# Patient Record
Sex: Female | Born: 1935
Health system: Southern US, Community
[De-identification: ages and names within clinical notes are randomized; demographics above are authoritative.]

## PROBLEM LIST (undated history)

## (undated) DIAGNOSIS — R32 Unspecified urinary incontinence: Secondary | ICD-10-CM

## (undated) DIAGNOSIS — T7840XA Allergy, unspecified, initial encounter: Secondary | ICD-10-CM

## (undated) DIAGNOSIS — A64 Unspecified sexually transmitted disease: Secondary | ICD-10-CM

## (undated) DIAGNOSIS — I82409 Acute embolism and thrombosis of unspecified deep veins of unspecified lower extremity: Secondary | ICD-10-CM

## (undated) DIAGNOSIS — M810 Age-related osteoporosis without current pathological fracture: Secondary | ICD-10-CM

## (undated) DIAGNOSIS — IMO0001 Reserved for inherently not codable concepts without codable children: Secondary | ICD-10-CM

## (undated) DIAGNOSIS — H269 Unspecified cataract: Secondary | ICD-10-CM

## (undated) DIAGNOSIS — I10 Essential (primary) hypertension: Secondary | ICD-10-CM

## (undated) DIAGNOSIS — F329 Major depressive disorder, single episode, unspecified: Secondary | ICD-10-CM

## (undated) DIAGNOSIS — K219 Gastro-esophageal reflux disease without esophagitis: Secondary | ICD-10-CM

## (undated) DIAGNOSIS — G609 Hereditary and idiopathic neuropathy, unspecified: Secondary | ICD-10-CM

## (undated) DIAGNOSIS — N209 Urinary calculus, unspecified: Secondary | ICD-10-CM

## (undated) DIAGNOSIS — F411 Generalized anxiety disorder: Secondary | ICD-10-CM

## (undated) DIAGNOSIS — M1990 Unspecified osteoarthritis, unspecified site: Secondary | ICD-10-CM

## (undated) DIAGNOSIS — F32A Depression, unspecified: Secondary | ICD-10-CM

## (undated) DIAGNOSIS — E7800 Pure hypercholesterolemia, unspecified: Secondary | ICD-10-CM

## (undated) DIAGNOSIS — Z794 Long term (current) use of insulin: Secondary | ICD-10-CM

## (undated) DIAGNOSIS — C169 Malignant neoplasm of stomach, unspecified: Secondary | ICD-10-CM

## (undated) DIAGNOSIS — E119 Type 2 diabetes mellitus without complications: Secondary | ICD-10-CM

## (undated) DIAGNOSIS — K5730 Diverticulosis of large intestine without perforation or abscess without bleeding: Secondary | ICD-10-CM

## (undated) HISTORY — DX: Unspecified sexually transmitted disease: A64

## (undated) HISTORY — PX: ABDOMINAL SURGERY: SHX537

## (undated) HISTORY — DX: Type 2 diabetes mellitus without complications: E11.9

## (undated) HISTORY — DX: Allergy, unspecified, initial encounter: T78.40XA

## (undated) HISTORY — DX: Diverticulosis of large intestine without perforation or abscess without bleeding: K57.30

## (undated) HISTORY — DX: Unspecified urinary incontinence: R32

## (undated) HISTORY — DX: Pure hypercholesterolemia, unspecified: E78.00

## (undated) HISTORY — DX: Unspecified cataract: H26.9

## (undated) HISTORY — DX: Essential (primary) hypertension: I10

## (undated) HISTORY — DX: Unspecified osteoarthritis, unspecified site: M19.90

## (undated) HISTORY — DX: Gastro-esophageal reflux disease without esophagitis: K21.9

## (undated) HISTORY — DX: Long term (current) use of insulin: Z79.4

## (undated) HISTORY — DX: Age-related osteoporosis without current pathological fracture: M81.0

## (undated) HISTORY — DX: Urinary calculus, unspecified: N20.9

## (undated) HISTORY — DX: Reserved for inherently not codable concepts without codable children: IMO0001

## (undated) HISTORY — DX: Generalized anxiety disorder: F41.1

## (undated) HISTORY — DX: Hereditary and idiopathic neuropathy, unspecified: G60.9

---

## 1993-07-15 HISTORY — PX: CHOLECYSTECTOMY: SHX55

## 1997-10-27 ENCOUNTER — Ambulatory Visit (HOSPITAL_COMMUNITY): Admission: RE | Admit: 1997-10-27 | Discharge: 1997-10-27 | Payer: Self-pay | Admitting: Urology

## 1998-11-08 ENCOUNTER — Encounter: Admission: RE | Admit: 1998-11-08 | Discharge: 1999-02-06 | Payer: Self-pay | Admitting: Endocrinology

## 1999-03-13 ENCOUNTER — Encounter: Payer: Self-pay | Admitting: Endocrinology

## 1999-03-13 ENCOUNTER — Ambulatory Visit (HOSPITAL_COMMUNITY): Admission: RE | Admit: 1999-03-13 | Discharge: 1999-03-13 | Payer: Self-pay | Admitting: Endocrinology

## 2001-10-19 ENCOUNTER — Ambulatory Visit: Admission: RE | Admit: 2001-10-19 | Discharge: 2001-10-19 | Payer: Self-pay | Admitting: Endocrinology

## 2001-12-21 ENCOUNTER — Observation Stay (HOSPITAL_COMMUNITY): Admission: EM | Admit: 2001-12-21 | Discharge: 2001-12-21 | Payer: Self-pay

## 2001-12-21 ENCOUNTER — Encounter: Payer: Self-pay | Admitting: Emergency Medicine

## 2001-12-21 ENCOUNTER — Encounter: Payer: Self-pay | Admitting: Internal Medicine

## 2002-05-13 ENCOUNTER — Other Ambulatory Visit: Admission: RE | Admit: 2002-05-13 | Discharge: 2002-05-13 | Payer: Self-pay | Admitting: Endocrinology

## 2002-06-08 ENCOUNTER — Ambulatory Visit (HOSPITAL_COMMUNITY): Admission: RE | Admit: 2002-06-08 | Discharge: 2002-06-08 | Payer: Self-pay | Admitting: Gastroenterology

## 2002-06-08 ENCOUNTER — Encounter: Payer: Self-pay | Admitting: Gastroenterology

## 2002-06-08 HISTORY — PX: ESOPHAGOGASTRODUODENOSCOPY: SHX1529

## 2002-08-05 ENCOUNTER — Encounter: Payer: Self-pay | Admitting: Endocrinology

## 2002-08-05 ENCOUNTER — Ambulatory Visit (HOSPITAL_COMMUNITY): Admission: RE | Admit: 2002-08-05 | Discharge: 2002-08-05 | Payer: Self-pay | Admitting: Endocrinology

## 2006-02-18 ENCOUNTER — Ambulatory Visit: Payer: Self-pay | Admitting: Endocrinology

## 2006-04-22 ENCOUNTER — Ambulatory Visit: Payer: Self-pay | Admitting: Endocrinology

## 2006-06-04 ENCOUNTER — Ambulatory Visit: Payer: Self-pay | Admitting: Endocrinology

## 2006-06-15 ENCOUNTER — Encounter: Admission: RE | Admit: 2006-06-15 | Discharge: 2006-06-15 | Payer: Self-pay | Admitting: Obstetrics and Gynecology

## 2006-07-23 ENCOUNTER — Ambulatory Visit: Payer: Self-pay | Admitting: Endocrinology

## 2006-08-15 LAB — HM MAMMOGRAPHY: HM Mammogram: NORMAL

## 2006-09-16 ENCOUNTER — Ambulatory Visit: Payer: Self-pay | Admitting: Endocrinology

## 2006-12-02 ENCOUNTER — Ambulatory Visit: Payer: Self-pay | Admitting: Endocrinology

## 2007-02-23 ENCOUNTER — Encounter: Payer: Self-pay | Admitting: Endocrinology

## 2007-02-23 DIAGNOSIS — E119 Type 2 diabetes mellitus without complications: Secondary | ICD-10-CM

## 2007-02-23 DIAGNOSIS — G609 Hereditary and idiopathic neuropathy, unspecified: Secondary | ICD-10-CM

## 2007-02-23 DIAGNOSIS — M810 Age-related osteoporosis without current pathological fracture: Secondary | ICD-10-CM | POA: Insufficient documentation

## 2007-02-23 DIAGNOSIS — K219 Gastro-esophageal reflux disease without esophagitis: Secondary | ICD-10-CM

## 2007-02-23 DIAGNOSIS — Z794 Long term (current) use of insulin: Secondary | ICD-10-CM

## 2007-02-23 DIAGNOSIS — IMO0001 Reserved for inherently not codable concepts without codable children: Secondary | ICD-10-CM | POA: Insufficient documentation

## 2007-02-23 DIAGNOSIS — M1990 Unspecified osteoarthritis, unspecified site: Secondary | ICD-10-CM | POA: Insufficient documentation

## 2007-02-23 HISTORY — DX: Hereditary and idiopathic neuropathy, unspecified: G60.9

## 2007-02-23 HISTORY — DX: Gastro-esophageal reflux disease without esophagitis: K21.9

## 2007-02-23 HISTORY — DX: Age-related osteoporosis without current pathological fracture: M81.0

## 2007-02-23 HISTORY — DX: Unspecified osteoarthritis, unspecified site: M19.90

## 2007-02-24 ENCOUNTER — Ambulatory Visit: Payer: Self-pay | Admitting: Endocrinology

## 2007-03-10 ENCOUNTER — Ambulatory Visit: Payer: Self-pay | Admitting: Endocrinology

## 2007-04-24 ENCOUNTER — Ambulatory Visit: Admission: RE | Admit: 2007-04-24 | Discharge: 2007-04-24 | Payer: Self-pay | Admitting: Endocrinology

## 2007-04-24 ENCOUNTER — Ambulatory Visit: Payer: Self-pay | Admitting: Endocrinology

## 2007-04-24 LAB — CONVERTED CEMR LAB
ALT: 32 units/L (ref 0–35)
Albumin: 3.6 g/dL (ref 3.5–5.2)
Alkaline Phosphatase: 73 units/L (ref 39–117)
Basophils Absolute: 0 10*3/uL (ref 0.0–0.1)
Basophils Relative: 0.4 % (ref 0.0–1.0)
CO2: 28 meq/L (ref 19–32)
Chloride: 110 meq/L (ref 96–112)
Cholesterol: 121 mg/dL (ref 0–200)
Creatinine,U: 76.2 mg/dL
Crystals: NEGATIVE
Eosinophils Relative: 1.8 % (ref 0.0–5.0)
GFR calc non Af Amer: 65 mL/min
Glucose, Bld: 203 mg/dL — ABNORMAL HIGH (ref 70–99)
MCV: 91.3 fL (ref 78.0–100.0)
Microalb Creat Ratio: 21 mg/g (ref 0.0–30.0)
Microalb, Ur: 1.6 mg/dL (ref 0.0–1.9)
Monocytes Absolute: 0.4 10*3/uL (ref 0.2–0.7)
Mucus, UA: NEGATIVE
Neutro Abs: 2.8 10*3/uL (ref 1.4–7.7)
Neutrophils Relative %: 60.7 % (ref 43.0–77.0)
Platelets: 237 10*3/uL (ref 150–400)
RBC: 4.21 M/uL (ref 3.87–5.11)
RDW: 11.5 % (ref 11.5–14.6)
Total CHOL/HDL Ratio: 2.9
Total Protein: 6.9 g/dL (ref 6.0–8.3)
Triglycerides: 94 mg/dL (ref 0–149)
Urine Glucose: NEGATIVE mg/dL
Urobilinogen, UA: 0.2 (ref 0.0–1.0)
VLDL: 19 mg/dL (ref 0–40)

## 2007-05-04 ENCOUNTER — Ambulatory Visit: Payer: Self-pay | Admitting: Vascular Surgery

## 2007-08-26 ENCOUNTER — Ambulatory Visit: Payer: Self-pay | Admitting: Endocrinology

## 2007-08-28 ENCOUNTER — Encounter: Payer: Self-pay | Admitting: Endocrinology

## 2007-10-01 ENCOUNTER — Ambulatory Visit: Payer: Self-pay | Admitting: Endocrinology

## 2007-11-10 ENCOUNTER — Ambulatory Visit: Payer: Self-pay | Admitting: Endocrinology

## 2007-11-11 LAB — CONVERTED CEMR LAB: Hgb A1c MFr Bld: 7.8 % — ABNORMAL HIGH (ref 4.6–6.0)

## 2008-02-01 ENCOUNTER — Ambulatory Visit: Payer: Self-pay | Admitting: Internal Medicine

## 2008-02-01 ENCOUNTER — Ambulatory Visit: Payer: Self-pay | Admitting: Endocrinology

## 2008-02-01 DIAGNOSIS — E1169 Type 2 diabetes mellitus with other specified complication: Secondary | ICD-10-CM | POA: Insufficient documentation

## 2008-02-01 DIAGNOSIS — E7800 Pure hypercholesterolemia, unspecified: Secondary | ICD-10-CM

## 2008-02-01 HISTORY — DX: Pure hypercholesterolemia, unspecified: E78.00

## 2008-02-15 ENCOUNTER — Telehealth (INDEPENDENT_AMBULATORY_CARE_PROVIDER_SITE_OTHER): Payer: Self-pay | Admitting: *Deleted

## 2008-03-08 ENCOUNTER — Ambulatory Visit: Payer: Self-pay | Admitting: Internal Medicine

## 2008-03-08 DIAGNOSIS — F411 Generalized anxiety disorder: Secondary | ICD-10-CM | POA: Insufficient documentation

## 2008-03-08 DIAGNOSIS — K5730 Diverticulosis of large intestine without perforation or abscess without bleeding: Secondary | ICD-10-CM

## 2008-03-08 DIAGNOSIS — I10 Essential (primary) hypertension: Secondary | ICD-10-CM

## 2008-03-08 DIAGNOSIS — I152 Hypertension secondary to endocrine disorders: Secondary | ICD-10-CM | POA: Insufficient documentation

## 2008-03-08 DIAGNOSIS — E1159 Type 2 diabetes mellitus with other circulatory complications: Secondary | ICD-10-CM | POA: Insufficient documentation

## 2008-03-08 DIAGNOSIS — J310 Chronic rhinitis: Secondary | ICD-10-CM

## 2008-03-08 HISTORY — DX: Essential (primary) hypertension: I10

## 2008-03-08 HISTORY — DX: Generalized anxiety disorder: F41.1

## 2008-03-08 HISTORY — DX: Diverticulosis of large intestine without perforation or abscess without bleeding: K57.30

## 2008-04-11 ENCOUNTER — Ambulatory Visit: Payer: Self-pay | Admitting: Endocrinology

## 2008-06-13 ENCOUNTER — Telehealth: Payer: Self-pay | Admitting: Endocrinology

## 2008-06-27 ENCOUNTER — Ambulatory Visit: Payer: Self-pay | Admitting: Endocrinology

## 2008-06-27 LAB — CONVERTED CEMR LAB: Hgb A1c MFr Bld: 7.8 % — ABNORMAL HIGH (ref 4.6–6.0)

## 2008-06-30 ENCOUNTER — Encounter: Payer: Self-pay | Admitting: Endocrinology

## 2008-07-05 ENCOUNTER — Encounter: Payer: Self-pay | Admitting: Internal Medicine

## 2008-07-19 ENCOUNTER — Ambulatory Visit: Payer: Self-pay | Admitting: Endocrinology

## 2008-08-09 ENCOUNTER — Telehealth: Payer: Self-pay | Admitting: Endocrinology

## 2008-10-17 ENCOUNTER — Ambulatory Visit: Payer: Self-pay | Admitting: Endocrinology

## 2008-10-17 LAB — CONVERTED CEMR LAB: Hgb A1c MFr Bld: 7.8 % — ABNORMAL HIGH (ref 4.6–6.5)

## 2009-01-24 ENCOUNTER — Telehealth (INDEPENDENT_AMBULATORY_CARE_PROVIDER_SITE_OTHER): Payer: Self-pay | Admitting: *Deleted

## 2009-02-15 ENCOUNTER — Ambulatory Visit: Payer: Self-pay | Admitting: Endocrinology

## 2009-02-15 DIAGNOSIS — D72819 Decreased white blood cell count, unspecified: Secondary | ICD-10-CM | POA: Insufficient documentation

## 2009-02-15 LAB — CONVERTED CEMR LAB
ALT: 32 units/L (ref 0–35)
AST: 31 units/L (ref 0–37)
Albumin: 3.7 g/dL (ref 3.5–5.2)
Alkaline Phosphatase: 63 units/L (ref 39–117)
BUN: 15 mg/dL (ref 6–23)
Basophils Absolute: 0.1 10*3/uL (ref 0.0–0.1)
Basophils Relative: 1.3 % (ref 0.0–3.0)
Bilirubin Urine: NEGATIVE
Bilirubin, Direct: 0.2 mg/dL (ref 0.0–0.3)
CO2: 30 meq/L (ref 19–32)
Calcium: 9.5 mg/dL (ref 8.4–10.5)
Chloride: 108 meq/L (ref 96–112)
Cholesterol: 125 mg/dL (ref 0–200)
Creatinine, Ser: 0.8 mg/dL (ref 0.4–1.2)
Creatinine,U: 97.1 mg/dL
Eosinophils Absolute: 0.1 10*3/uL (ref 0.0–0.7)
Eosinophils Relative: 2.2 % (ref 0.0–5.0)
GFR calc non Af Amer: 90.21 mL/min (ref >60)
Glucose, Bld: 187 mg/dL — ABNORMAL HIGH (ref 70–99)
HCT: 40.8 % (ref 36.0–46.0)
HDL: 41.5 mg/dL (ref >39.00)
Hemoglobin, Urine: NEGATIVE
Hemoglobin: 13.5 g/dL (ref 12.0–15.0)
Hgb A1c MFr Bld: 8 % — ABNORMAL HIGH (ref 4.6–6.5)
Ketones, ur: NEGATIVE mg/dL
LDL Cholesterol: 62 mg/dL (ref 0–99)
Lymphocytes Relative: 36.9 % (ref 12.0–46.0)
Lymphs Abs: 1.6 10*3/uL (ref 0.7–4.0)
MCHC: 33.1 g/dL (ref 30.0–36.0)
MCV: 93.2 fL (ref 78.0–100.0)
Microalb Creat Ratio: 12.4 mg/g (ref 0.0–30.0)
Microalb, Ur: 1.2 mg/dL (ref 0.0–1.9)
Monocytes Absolute: 0.3 10*3/uL (ref 0.1–1.0)
Monocytes Relative: 7.1 % (ref 3.0–12.0)
Neutro Abs: 2.2 10*3/uL (ref 1.4–7.7)
Neutrophils Relative %: 52.5 % (ref 43.0–77.0)
Nitrite: NEGATIVE
Platelets: 241 10*3/uL (ref 150.0–400.0)
Potassium: 4.5 meq/L (ref 3.5–5.1)
RBC: 4.38 M/uL (ref 3.87–5.11)
RDW: 11.9 % (ref 11.5–14.6)
Sodium: 142 meq/L (ref 135–145)
Specific Gravity, Urine: 1.015 (ref 1.000–1.030)
TSH: 0.8 microintl units/mL (ref 0.35–5.50)
Total Bilirubin: 0.9 mg/dL (ref 0.3–1.2)
Total CHOL/HDL Ratio: 3
Total Protein, Urine: NEGATIVE mg/dL
Total Protein: 7.2 g/dL (ref 6.0–8.3)
Triglycerides: 110 mg/dL (ref 0.0–149.0)
Urine Glucose: NEGATIVE mg/dL
Urobilinogen, UA: 0.2 (ref 0.0–1.0)
VLDL: 22 mg/dL (ref 0.0–40.0)
WBC: 4.3 10*3/uL — ABNORMAL LOW (ref 4.5–10.5)
pH: 6 (ref 5.0–8.0)

## 2009-02-22 ENCOUNTER — Telehealth (INDEPENDENT_AMBULATORY_CARE_PROVIDER_SITE_OTHER): Payer: Self-pay | Admitting: *Deleted

## 2009-02-23 ENCOUNTER — Ambulatory Visit: Payer: Self-pay | Admitting: Endocrinology

## 2009-03-08 ENCOUNTER — Telehealth: Payer: Self-pay | Admitting: Endocrinology

## 2009-03-15 ENCOUNTER — Encounter: Payer: Self-pay | Admitting: Endocrinology

## 2009-06-06 ENCOUNTER — Ambulatory Visit: Payer: Self-pay | Admitting: Endocrinology

## 2009-06-06 LAB — CONVERTED CEMR LAB
Glucose, Urine, Semiquant: 250
Hgb A1c MFr Bld: 7.8 % — ABNORMAL HIGH (ref 4.6–6.5)
Microalb, Ur: 0.7 mg/dL (ref 0.0–1.9)
Nitrite: NEGATIVE
Specific Gravity, Urine: 1.01
Urobilinogen, UA: 0.2

## 2009-06-17 ENCOUNTER — Encounter: Payer: Self-pay | Admitting: Endocrinology

## 2009-06-19 ENCOUNTER — Telehealth (INDEPENDENT_AMBULATORY_CARE_PROVIDER_SITE_OTHER): Payer: Self-pay | Admitting: *Deleted

## 2009-06-22 ENCOUNTER — Telehealth (INDEPENDENT_AMBULATORY_CARE_PROVIDER_SITE_OTHER): Payer: Self-pay | Admitting: *Deleted

## 2009-06-25 ENCOUNTER — Encounter: Payer: Self-pay | Admitting: Endocrinology

## 2009-06-30 ENCOUNTER — Encounter: Payer: Self-pay | Admitting: Endocrinology

## 2009-06-30 ENCOUNTER — Telehealth (INDEPENDENT_AMBULATORY_CARE_PROVIDER_SITE_OTHER): Payer: Self-pay | Admitting: *Deleted

## 2009-07-06 ENCOUNTER — Telehealth (INDEPENDENT_AMBULATORY_CARE_PROVIDER_SITE_OTHER): Payer: Self-pay | Admitting: *Deleted

## 2009-07-18 ENCOUNTER — Ambulatory Visit: Payer: Self-pay | Admitting: Endocrinology

## 2009-07-21 ENCOUNTER — Telehealth (INDEPENDENT_AMBULATORY_CARE_PROVIDER_SITE_OTHER): Payer: Self-pay | Admitting: *Deleted

## 2009-08-15 ENCOUNTER — Ambulatory Visit: Payer: Self-pay | Admitting: Endocrinology

## 2009-08-15 LAB — CONVERTED CEMR LAB: Hgb A1c MFr Bld: 7.9 % — ABNORMAL HIGH (ref 4.6–6.5)

## 2009-09-20 ENCOUNTER — Telehealth: Payer: Self-pay | Admitting: Endocrinology

## 2009-11-02 ENCOUNTER — Ambulatory Visit: Payer: Self-pay | Admitting: Internal Medicine

## 2009-11-06 LAB — CONVERTED CEMR LAB
ALT: 28 units/L (ref 0–35)
AST: 31 units/L (ref 0–37)
Albumin: 3.6 g/dL (ref 3.5–5.2)
Alkaline Phosphatase: 53 units/L (ref 39–117)
Bilirubin Urine: NEGATIVE
Ketones, ur: NEGATIVE mg/dL
Total Protein: 6.9 g/dL (ref 6.0–8.3)
Urine Glucose: 100 mg/dL
Urobilinogen, UA: 0.2 (ref 0.0–1.0)

## 2009-11-20 ENCOUNTER — Telehealth: Payer: Self-pay | Admitting: Internal Medicine

## 2009-11-21 ENCOUNTER — Ambulatory Visit: Payer: Self-pay | Admitting: Endocrinology

## 2009-11-21 LAB — CONVERTED CEMR LAB
ALT: 31 units/L (ref 0–35)
AST: 24 units/L (ref 0–37)
Alkaline Phosphatase: 67 units/L (ref 39–117)
BUN: 18 mg/dL (ref 6–23)
Basophils Relative: 0.4 % (ref 0.0–3.0)
Chloride: 104 meq/L (ref 96–112)
Eosinophils Absolute: 0.1 10*3/uL (ref 0.0–0.7)
Eosinophils Relative: 2.3 % (ref 0.0–5.0)
GFR calc non Af Amer: 64.36 mL/min (ref >60)
Hgb A1c MFr Bld: 7.4 % — ABNORMAL HIGH (ref 4.6–6.5)
Lymphocytes Relative: 25.8 % (ref 12.0–46.0)
Monocytes Absolute: 0.3 10*3/uL (ref 0.1–1.0)
Neutrophils Relative %: 65.9 % (ref 43.0–77.0)
Platelets: 248 10*3/uL (ref 150.0–400.0)
Potassium: 4.1 meq/L (ref 3.5–5.1)
RBC: 4.2 M/uL (ref 3.87–5.11)
Sodium: 144 meq/L (ref 135–145)
Total Bilirubin: 0.6 mg/dL (ref 0.3–1.2)
WBC: 5.4 10*3/uL (ref 4.5–10.5)

## 2009-11-22 ENCOUNTER — Telehealth: Payer: Self-pay | Admitting: Endocrinology

## 2009-11-23 ENCOUNTER — Ambulatory Visit: Payer: Self-pay | Admitting: Internal Medicine

## 2009-12-19 ENCOUNTER — Ambulatory Visit: Payer: Self-pay | Admitting: Endocrinology

## 2009-12-19 DIAGNOSIS — M545 Low back pain: Secondary | ICD-10-CM

## 2009-12-19 LAB — CONVERTED CEMR LAB: Sed Rate: 21 mm/hr (ref 0–22)

## 2009-12-21 ENCOUNTER — Encounter: Payer: Self-pay | Admitting: Endocrinology

## 2009-12-24 ENCOUNTER — Encounter: Admission: RE | Admit: 2009-12-24 | Discharge: 2009-12-24 | Payer: Self-pay | Admitting: Endocrinology

## 2010-01-17 ENCOUNTER — Encounter: Payer: Self-pay | Admitting: Endocrinology

## 2010-01-21 ENCOUNTER — Telehealth: Payer: Self-pay | Admitting: Endocrinology

## 2010-01-31 ENCOUNTER — Encounter: Payer: Self-pay | Admitting: Endocrinology

## 2010-02-02 ENCOUNTER — Telehealth: Payer: Self-pay | Admitting: Endocrinology

## 2010-02-13 ENCOUNTER — Ambulatory Visit: Payer: Self-pay | Admitting: Endocrinology

## 2010-03-21 ENCOUNTER — Ambulatory Visit: Payer: Self-pay | Admitting: Endocrinology

## 2010-03-21 DIAGNOSIS — K625 Hemorrhage of anus and rectum: Secondary | ICD-10-CM

## 2010-03-21 LAB — CONVERTED CEMR LAB
Basophils Absolute: 0 10*3/uL (ref 0.0–0.1)
Basophils Relative: 0.3 % (ref 0.0–3.0)
Eosinophils Absolute: 0.1 10*3/uL (ref 0.0–0.7)
HCT: 41 % (ref 36.0–46.0)
Hemoglobin: 14.1 g/dL (ref 12.0–15.0)
Lymphs Abs: 1.5 10*3/uL (ref 0.7–4.0)
MCHC: 34.4 g/dL (ref 30.0–36.0)
MCV: 93 fL (ref 78.0–100.0)
Monocytes Absolute: 0.5 10*3/uL (ref 0.1–1.0)
Neutro Abs: 3.2 10*3/uL (ref 1.4–7.7)
RBC: 4.41 M/uL (ref 3.87–5.11)
RDW: 12.9 % (ref 11.5–14.6)

## 2010-04-10 ENCOUNTER — Telehealth: Payer: Self-pay | Admitting: Endocrinology

## 2010-04-21 ENCOUNTER — Emergency Department (HOSPITAL_COMMUNITY): Admission: EM | Admit: 2010-04-21 | Discharge: 2010-04-21 | Payer: Self-pay | Admitting: Emergency Medicine

## 2010-04-23 ENCOUNTER — Telehealth: Payer: Self-pay | Admitting: Internal Medicine

## 2010-04-24 ENCOUNTER — Encounter (INDEPENDENT_AMBULATORY_CARE_PROVIDER_SITE_OTHER): Payer: Self-pay | Admitting: *Deleted

## 2010-04-24 ENCOUNTER — Telehealth (INDEPENDENT_AMBULATORY_CARE_PROVIDER_SITE_OTHER): Payer: Self-pay | Admitting: *Deleted

## 2010-04-24 ENCOUNTER — Telehealth: Payer: Self-pay | Admitting: Gastroenterology

## 2010-04-26 ENCOUNTER — Ambulatory Visit: Payer: Self-pay | Admitting: Gastroenterology

## 2010-05-22 ENCOUNTER — Telehealth (INDEPENDENT_AMBULATORY_CARE_PROVIDER_SITE_OTHER): Payer: Self-pay | Admitting: *Deleted

## 2010-05-24 ENCOUNTER — Ambulatory Visit: Payer: Self-pay | Admitting: Gastroenterology

## 2010-06-19 ENCOUNTER — Encounter: Payer: Self-pay | Admitting: Endocrinology

## 2010-06-28 ENCOUNTER — Telehealth: Payer: Self-pay | Admitting: Endocrinology

## 2010-06-29 ENCOUNTER — Telehealth: Payer: Self-pay | Admitting: Endocrinology

## 2010-07-18 ENCOUNTER — Ambulatory Visit: Admit: 2010-07-18 | Payer: Self-pay | Admitting: Endocrinology

## 2010-07-23 ENCOUNTER — Other Ambulatory Visit: Payer: Self-pay | Admitting: Endocrinology

## 2010-07-23 ENCOUNTER — Ambulatory Visit
Admission: RE | Admit: 2010-07-23 | Discharge: 2010-07-23 | Payer: Self-pay | Source: Home / Self Care | Attending: Endocrinology | Admitting: Endocrinology

## 2010-07-23 LAB — HEMOGLOBIN A1C: Hgb A1c MFr Bld: 8 % — ABNORMAL HIGH (ref 4.6–6.5)

## 2010-08-05 ENCOUNTER — Encounter: Payer: Self-pay | Admitting: Endocrinology

## 2010-08-14 NOTE — Procedures (Signed)
Summary: EGD/[place]  EGD/[place]   Imported By: Sherian Rein 04/26/2010 13:16:17  _____________________________________________________________________  External Attachment:    Type:   Image     Comment:   External Document

## 2010-08-14 NOTE — Assessment & Plan Note (Signed)
Summary: rectal bleeding/Tammy Hughes    History of Present Illness Visit Type: Initial Consult Primary GI MD: Melvia Heaps MD [facility] Primary Provider: Romero Belling, MD Chief Complaint: rectal bleeding: off/on x 2 month, some constipation History of Present Illness:   Tammy Hughes is a 77 year old white female referred at the request of Dr. Everardo All for evaluation of rectal bleeding.  She sees blood on the toilet tissue and in  the water.  She has been complaining of mild constipation and aching lower abdominal pain.  She is taking Tylenol with Codeine for the latter.   She denies rectal pain.   GI Review of Systems    Reports abdominal pain and  bloating.     Location of  Abdominal pain: lower abdomen.    Denies acid reflux, belching, chest pain, dysphagia with liquids, dysphagia with solids, heartburn, loss of appetite, nausea, vomiting, vomiting blood, weight loss, and  weight gain.      Reports constipation, diarrhea, and  rectal bleeding.     Denies anal fissure, black tarry stools, change in bowel habit, diverticulosis, fecal incontinence, heme positive stool, hemorrhoids, irritable bowel syndrome, jaundice, light color stool, liver problems, and  rectal pain.    Current Medications (verified): 1)  Calcium 600/vitamin D 600-200 Mg-Unit  Tabs (Calcium Carbonate-Vitamin D) .... Take 1 By Mouth Once Daily 2)  Humalog Mix 75/25 Kwikpen 75-25 %  Susp (Insulin Lispro Prot & Lispro) .... 46 Units Each Am and 49 With The Evening Meal 3)  Lasix 20 Mg  Tabs (Furosemide) .... Once Daily 4)  Lisinopril 10 Mg Tabs (Lisinopril) .Marland Kitchen.. 1 By Mouth Once Daily 5)  Hydrocodone-Acetaminophen 5-500 Mg Tabs (Hydrocodone-Acetaminophen) .Marland Kitchen.. 1 Q4h As Needed Pain 6)  Simvastatin 40 Mg Tabs (Simvastatin) .Marland Kitchen.. 1 Tab At Bedtime 7)  Prilosec 20 Mg Cpdr (Omeprazole) .Marland Kitchen.. 1 Tab Once Daily  Allergies (verified): 1)  ! * Asprin  Past History:  Past Medical History: Reviewed history from 11/02/2009 and no changes  required. Urolithiasis HYPERTENSION (ICD-401.9) DIVERTICULOSIS, COLON (ICD-562.10) ANXIETY (ICD-300.00) ALLERGIC RHINITIS (ICD-477.9) HYPERCHOLESTEROLEMIA (ICD-272.0) EDEMA (ICD-782.3) OSTEOPOROSIS (ICD-733.00) OSTEOARTHRITIS (ICD-715.90) PERIPHERAL NEUROPATHY (ICD-356.9) GERD (ICD-530.81) DIABETES MELLITUS, TYPE I (ICD-250.01)  Past Surgical History: Reviewed history from 03/08/2008 and no changes required. Cholecystectomy (1995) EGD (06/08/2002)  Family History: Reviewed history from 03/08/2008 and no changes required. 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  The patient denies allergy/sinus, anemia, anxiety-new, blood in urine, breast changes/lumps, change in vision, confusion, coughing up blood, depression-new, fainting, fatigue, fever, hearing problems, heart murmur, heart rhythm changes, itching, menstrual pain, muscle pains/cramps, night sweats, nosebleeds, pregnancy symptoms, shortness of breath, skin rash, sleeping problems, swollen lymph glands, thirst - excessive , urination - excessive , urination changes/pain, urine leakage, vision changes, and voice change.         All other systems were reviewed and were negative   Vital Signs:  Patient profile:   77 year old female Height:      60 inches Weight:      162.25 pounds BMI:     31.80 Pulse rate:   60 / minute Pulse rhythm:   regular BP sitting:   100 / 62  (left arm) Cuff size:   regular  Vitals Entered By:  June McMurray CMA Duncan Dull) (April 26, 2010 8:33 AM)  Physical Exam  Additional Exam:  On physical exam she is a well-developed well-nourished female  skin: anicter  HEENT: normocephalic; PEERLA; no nasal or  orpharyngeal abnormalities neck: supple nodes: no cervical adenopathy chest: clear cor:  no murmurs, gallops or rubs abd:  bowel sounds normoactive; no abdominal masses, tenderness, organomegaly rectal: no masses; stool heme negative; small external hemorrhoids are present ext: no cyanosis, clubbing, or edema skeletal: no gross skeletal abnormalities neuro: alert, oriented x 3; no focal abnormalities    Impression & Recommendations:  Problem # 1:  RECTAL BLEEDING (ICD-569.3)  Bleeding is most likely secondary to hemorrhoids.  A more proximal colonic bleeding source should be ruled out.  Recommendations #1 colonoscopy  Risks, alternatives, and complications of the procedure, including bleeding, perforation, and possible need for surgery, were explained to the patient.  Patient's questions were answered.  Orders: Colonoscopy (Colon)  Problem # 2:  ABDOMINAL PAIN (ICD-789.00)  Etiology is uncertain.  Recommendations #1 colonoscopy #2 hyomax p.r.n.  Orders: Colonoscopy (Colon)  Patient Instructions: 1)  Copy sent to : Romero Belling, MD 2)  Your Colonoscopy is scheduled on 05/24/2010 at 2:30pm 3)  You can pick up your MoviPrep today at your pharmacy 4)  We are also sending you in another prescription to your pharmacy 5)  The medication list was reviewed and reconciled.  All changed / newly prescribed medications were explained.  A complete medication list was provided to the patient / caregiver. 6)  Colonoscopy and Flexible Sigmoidoscopy brochure given.  7)  Conscious Sedation brochure given.  Prescriptions: MOVIPREP 100 GM  SOLR (PEG-KCL-NACL-NASULF-NA ASC-C) As per prep instructions.  #1 x 0   Entered by:   Merri Ray CMA (AAMA)   Authorized by:   Louis Meckel MD   Signed by:   Merri Ray CMA (AAMA) on 04/26/2010   Method used:   Electronically to        [facility]* (retail)       [address]       [place], [place]  [address]       Ph: [phone]        Fax: [phone]   RxID:   [id number] HYOMAX-SR 0.375 MG XR12H-TAB (HYOSCYAMINE SULFATE) take one tab twice a day as needed for abdominal pain  #15 x 1   Entered and Authorized by:   Louis Meckel MD   Signed by:   Merri Ray CMA (AAMA) on 04/26/2010   Method used:   Electronically to        [facility]* (retail)       [address]       [place], [place]  [address]       Ph: [phone]       Fax: [phone]   RxID:   [id number]

## 2010-08-14 NOTE — Letter (Signed)
Summary: Results Letter  [facility]  [address] [place], [place] [address]   Phone: [phone]  Fax: [phone]        April 26, 2010 MRN: [id number]    [facility] [address] [place], [place]  [address]    Dear Ms. LUCKENBAUGH,  It is my pleasure to have treated you recently as a new patient in my office. I appreciate your confidence and the opportunity to participate in your care.  Since I do have a busy inpatient endoscopy schedule and office schedule, my office hours vary weekly. I am, however, available for emergency calls everyday through my office. If I am not available for an urgent office appointment, another one of our gastroenterologist will be able to assist you.  My well-trained staff are prepared to help you at all times. For emergencies after office hours, a physician from our Gastroenterology section is always available through my 24 hour answering service  Once again I welcome you as a new patient and I look forward to a happy and healthy relationship             Sincerely,  Louis Meckel MD  This letter has been electronically signed by your physician.

## 2010-08-14 NOTE — Progress Notes (Signed)
Summary: PA/alt med--Vytorin  Phone Note From Pharmacy   Caller: [facility] [phone] Summary of Call: pharmacy called stating that pt transferred [facility]s [facility] to [facility] pharmacy. PA is needed on pt's Vytorin. Pharmacy is requesting PA [phone]) or alt medication. please advise. Initial call taken by: Margaret Pyle, CMA,  September 20, 2009 4:20 PM  Follow-up for Phone Call        what is alternative? Follow-up by: Minus Breeding MD,  September 20, 2009 5:56 PM  Additional Follow-up for Phone Call Additional follow up Details #1::        pharmacy contacted to get alt meds. pharmacy indicated pt would prefer PA. please proceed using number provided above. Additional Follow-up by: Margaret Pyle, CMA,  September 21, 2009 10:15 AM    Additional Follow-up for Phone Call Additional follow up Details #2::    I was directed to call [phone] for PA. Ordered PA form via automated system. Follow-up by: Lucious Groves,  September 21, 2009 11:47 AM  Additional Follow-up for Phone Call Additional follow up Details #3:: Details for Additional Follow-up Action Taken: Forms have not been rec'd, requested again. Lucious Groves  September 22, 2009 2:08 PM   Forms finally rec'd. Lucious Groves  September 25, 2009 4:58 PM   New/Updated Medications: SIMVASTATIN 80 MG TABS (SIMVASTATIN) 1 once daily Prescriptions: SIMVASTATIN 80 MG TABS (SIMVASTATIN) 1 once daily  #30 x 11   Entered and Authorized by:   Minus Breeding MD   Signed by:   Minus Breeding MD on 09/25/2009   Method used:   [facility]ron[facility] to        [id number]       [address]       [place], [place]  [id number]       Ph: [phone] or [phone]       Fax: [phone]   RxID:   [id number]  please call pt.  please try simvastatin (one of the 2 components of vytorin) by itself.  it is generic, and much cheaper.  i have sent to your pharmacy.  we'll recheck cholesterol at your next visit. Ab Leaming,  md .  Left message on machine to call back to office. Lucious Groves  September 28, 2009 10:23 AM   Patient notified. Lucious Groves  September 28, 2009 11:30 AM

## 2010-08-14 NOTE — Progress Notes (Signed)
  Phone Note Outgoing Call   Summary of Call: please call patient: please verify she is checking cbg three times a day   Follow-up for Phone Call        Per pt, she is checking CBS three times a day  Follow-up by: Margaret Pyle, CMA,  January 22, 2010 8:31 AM  Additional Follow-up for Phone Call Additional follow up Details #1::        thank you Additional Follow-up by: Minus Breeding MD,  January 22, 2010 12:05 PM

## 2010-08-14 NOTE — Progress Notes (Signed)
Summary: prep concern   Phone Note Call from Patient Call back at Home Phone [phone]   Caller: Patient Call For: Dr. Arlyce Dice Reason for Call: Talk to Nurse Summary of Call: prep concerns Initial call taken by: Vallarie Mare,  May 22, 2010 11:40 AM  Follow-up for Phone Call        Sspoke with spouse.  Concerned because she didn't eliminate all the foods she was supposed to.  Advised to drink plenty of fluids and avoid the foods on the list.  If constipated may take a laxative tonight. Follow-up by: Wyona Almas RN,  May 22, 2010 11:56 AM

## 2010-08-14 NOTE — Progress Notes (Signed)
Summary: OV-Ankle Orthosis  Phone Note Outgoing Call   Call placed by: Brenton Grills MA,  February 02, 2010 4:20 PM Call placed to: Patient Details for Reason: OV for Ankle Orthosis Summary of Call: R'cd form from [facility] for Ankle Orthosis--per MD, OV would be needed for eval--left VM for pt to callback office  Follow-up for Phone Call        left message on VM for pt to callback office Follow-up by: Brenton Grills MA,  February 06, 2010 11:48 AM  Additional Follow-up for Phone Call Additional follow up Details #1::        Appointment scheduled 02/13/10 1:00pm Additional Follow-up by: Brenton Grills MA,  February 08, 2010 3:42 PM

## 2010-08-14 NOTE — Assessment & Plan Note (Signed)
Summary: form/#/cd   Vital Signs:  Patient profile:   77 year old female Height:      60 inches (152.40 cm) Weight:      162.75 pounds (73.98 kg) O2 Sat:      98 % on Room air Temp:     97.1 degrees F (36.17 degrees C) oral Pulse rate:   73 / minute BP sitting:   124 / 68  (left arm) Cuff size:   large  Vitals Entered By: Josph Macho CMA (August 15, 2009 9:00 AM)  O2 Flow:  Room air CC: Pt needs paperwork filled out from [facility]/ [facility]  Is Patient Diabetic? Yes   Primary Provider:  Minus Breeding MD  CC:  Pt needs paperwork filled out from [facility]/ [facility] .  History of Present Illness: right shoulder pain persists. no cbg record, but states cbg's are well-controlled.  she says her cbg is highest in the afternoon, and lowest at hs. pt is here for examination for foster parentage.  Current Medications (verified): 1)  Calcium 600/vitamin D 600-200 Mg-Unit  Tabs (Calcium Carbonate-Vitamin D) .... Take 1 By Mouth Qd 2)  Humalog Mix 75/25 Kwikpen 75-25 %  Susp (Insulin Lispro Prot & Lispro) .... 45 Units Qam and 50 Qpm 3)  Lasix 20 Mg  Tabs (Furosemide) .... Qd 4)  Vytorin 10-80 Mg  Tabs (Ezetimibe-Simvastatin) .... Qd 5)  Lisinopril 10 Mg Tabs (Lisinopril) .Marland Kitchen.. 1 By Mouth Once Daily 6)  Hydrocodone-Acetaminophen 5-500 Mg Tabs (Hydrocodone-Acetaminophen) .Marland Kitchen.. 1 Q4h As Needed Pain  Allergies (verified): 1)  ! * Asprin  Past History:  Past Medical History: Last updated: 06/27/2008 Urolithiasis HYPERTENSION (ICD-401.9) DIVERTICULOSIS, COLON (ICD-562.10) ANXIETY (ICD-300.00) ALLERGIC RHINITIS (ICD-477.9) HYPERCHOLESTEROLEMIA (ICD-272.0) EDEMA (ICD-782.3) COUGH (ICD-786.2) URI (ICD-465.9) OSTEOPOROSIS (ICD-733.00) OSTEOARTHRITIS (ICD-715.90) PERIPHERAL NEUROPATHY (ICD-356.9) GERD (ICD-530.81) DIABETES MELLITUS, TYPE I (ICD-250.01)  Review of Systems  The patient denies hypoglycemia.         she denies numbness  Physical  Exam  General:  normal appearance.   Head:  head: no deformity eyes: no periorbital swelling, no proptosis external nose and ears are normal mouth: no lesion seen Lungs:  Clear to auscultation bilaterally. Normal respiratory effort.  Heart:  Regular rate and rhythm without murmurs or gallops noted. Normal S1,S2.   Abdomen:  abdomen is soft, nontender.  no hepatosplenomegaly.   not distended.  no hernia  Pulses:  dorsalis pedis intact bilat.   Extremities:  no deformity.  no ulcer on the feet.  feet are of normal color and temp.  no edema  Neurologic:  sensation is intact to touch on the feet  Skin:  insulin injection sites at anterior abdomen are normal  Psych:  Alert and cooperative; normal mood and affect; normal attention span and concentration.   Additional Exam:  Hemoglobin A1C       [H]  7.9 %   Impression & Recommendations:  Problem # 1:  SHOULDER PAIN, RIGHT (ICD-719.41) persistent  Problem # 2:  DIABETES MELLITUS, TYPE I (ICD-250.01) this is the best control this pt should aim for, given this regimen, which does a poor job of matching insulin to her changing needs throughout the day  Problem # 3:  foster parentage exam  Medications Added to Medication List This Visit: 1)  Humalog Mix 75/25 Kwikpen 75-25 % Susp (Insulin lispro prot & lispro) .... 46 units each am and 49 with the evening meal  Other Orders: Orthopedic Surgeon Referral (  Ortho Surgeon) TLB-A1C / Hgb A1C (Glycohemoglobin) (83036-A1C) Est. Patient Level IV ([address])  Patient Instructions: 1)  refer orthopedics 2)  tests are being ordered for you today.  a few days after the test(s), please call [phone] to hear your test results. 3)  change humalog 75/25 to 46 units am and 49 units with the evening meal. 4)  return 4 months

## 2010-08-14 NOTE — Progress Notes (Signed)
Summary: Call Report/[facility] pt  Phone Note Other Incoming   Caller: [facility] Summary of Call: [facility] Triage Call Report Triage Record Num: [id number] Operator: Kerby Moors Patient Name: Tammy Hughes Call Date & Time: 04/21/2010 2:56:44PM Patient Phone: [phone] PCP: Patient Gender: Female PCP Fax : Patient DOB: 05-06-1933 Practice Name: [facility] Reason for Call: Pt calling about Rectal Bleeding. RN returned phone call, spoke with husband. Pt has stepped out but will have her call back when she gets back home Protocol(s) Used: Office Note Recommended Outcome per Protocol: Information Noted and Sent to Office Reason for Outcome: Caller information to office Care Advice:  ~ 10/ Initial call taken by: Margaret Pyle, CMA,  April 23, 2010 8:25 AM  Follow-up for Phone Call        noted - thanks Follow-up by: Newt Lukes MD,  April 23, 2010 8:38 AM

## 2010-08-14 NOTE — Progress Notes (Signed)
Summary: Ondansetron pa  Phone Note From Pharmacy   Summary of Call: PA request--Ondansetron. Forms requested. Initial call taken by: Lucious Groves,  Nov 22, 2009 4:27 PM  Follow-up for Phone Call        Has the patient tried and failed Promethazine options? Follow-up by: Lucious Groves,  Nov 24, 2009 10:01 AM  Additional Follow-up for Phone Call Additional follow up Details #1::        i changed to promethazine, and sent to [facility] Additional Follow-up by: Minus Breeding MD,  Nov 24, 2009 1:08 PM    Additional Follow-up for Phone Call Additional follow up Details #2::    Informed pt. Follow-up by: Josph Macho RMA,  Nov 24, 2009 1:20 PM  New/Updated Medications: PROMETHAZINE HCL 12.5 MG TABS (PROMETHAZINE HCL) 1-2 every 4 hrs as needed for nausea Prescriptions: PROMETHAZINE HCL 12.5 MG TABS (PROMETHAZINE HCL) 1-2 every 4 hrs as needed for nausea  #30 x 1   Entered and Authorized by:   Minus Breeding MD   Signed by:   Minus Breeding MD on 11/24/2009   Method used:   Electronically to        [facility]* (retail)       [address]       [place], [place]  [address]       Ph: [phone]       Fax: [phone]   RxID:   [id number]

## 2010-08-14 NOTE — Progress Notes (Signed)
        Additional Follow-up for Phone Call Additional follow up Details #2::    New patient letter and information mailed to patient. Follow-up by: Jesse Fall RN,  April 24, 2010 9:36 AM

## 2010-08-14 NOTE — Procedures (Signed)
Summary: EGD with dilation   EGD  Procedure date:  06/08/2002  Findings:      Findings: Stricture:  Location: [facility]    DG Esopagus Dilation. - STATUS: Final                                            Perform Date: 25Nov03 10:30  Ordered By: Dennard Nip,         Ordered Date:  Facility: [facility]                              Department: DG  Service Report Text  ACCESSION:  [id number]   REPORT:  CLINICAL DATA:  DIFFICULTY WITH SWALLOWING.   ESOPHAGEAL DILATATION  FINDINGS:  THIS REPORT IS TO DOCUMENT THAT FLUOROSCOPY WAS   PROVIDED FOR CLINICAL USE.  A RADIOLOGIST WAS NOT IN   ATTENDANCE AND NO IMAGES WERE OBTAINED FOR RADIOGRAPHIC   INTERPRETATION.    IMPRESSION:  FLUOROSCOPY PROVIDED FOR CLINICAL USE.     TRANSCRIBED DATE:  RCW  JT       06/09/2002 2:37 pm                                           Read By:   Radiology Services                                           Released By:   [facility]iology Services   This report was created from the original endoscopy report, which was reviewed and signed by the above listed endoscopist.

## 2010-08-14 NOTE — Medication Information (Signed)
[facility]: [facility]   Imported By: Lester Sumner 12/22/2009 08:21:11  _____________________________________________________________________  External Attachment:    Type:   Image     Comment:   External Document

## 2010-08-14 NOTE — Assessment & Plan Note (Signed)
Summary: f/u appt/#/cd   Vital Signs:  Patient profile:   77 year old female Height:      60 inches (152.40 cm) Weight:      154.25 pounds (70.11 kg) BMI:     30.23 O2 Sat:      97 % on Room air Temp:     97.5 degrees F (36.39 degrees C) oral Pulse rate:   63 / minute BP sitting:   110 / 64  (left arm) Cuff size:   regular  Vitals Entered By: Brenton Grills MA (February 13, 2010 1:17 PM)  O2 Flow:  Room air CC: f/u appt/forms/aj Is Patient Diabetic? Yes   Primary Provider:  Minus Breeding MD  CC:  f/u appt/forms/aj.  History of Present Illness: the status of at least 3 ongoing medical problems is addressed today: dm:  no cbg record, but states cbg's are "sometimes high in the evening."  no hypoglycemic sxs.  low-back pain:  she does not want the ankle or back braces.   dyslipidemia:  she takes and tolerates zocor well.  Current Medications (verified): 1)  Calcium 600/vitamin D 600-200 Mg-Unit  Tabs (Calcium Carbonate-Vitamin D) .... Take 1 By Mouth Once Daily 2)  Humalog Mix 75/25 Kwikpen 75-25 %  Susp (Insulin Lispro Prot & Lispro) .... 46 Units Each Am and 49 With The Evening Meal 3)  Lasix 20 Mg  Tabs (Furosemide) .... Once Daily 4)  Lisinopril 10 Mg Tabs (Lisinopril) .Marland Kitchen.. 1 By Mouth Once Daily 5)  Hydrocodone-Acetaminophen 5-500 Mg Tabs (Hydrocodone-Acetaminophen) .Marland Kitchen.. 1 Q4h As Needed Pain 6)  Simvastatin 80 Mg Tabs (Simvastatin) .Marland Kitchen.. 1 Once Daily 7)  Promethazine Hcl 12.5 Mg Tabs (Promethazine Hcl) .Marland Kitchen.. 1-2 Every 4 Hrs As Needed For Nausea  Allergies (verified): 1)  ! * Asprin  Past History:  Past Medical History: Last updated: 11/02/2009 Urolithiasis HYPERTENSION (ICD-401.9) DIVERTICULOSIS, COLON (ICD-562.10) ANXIETY (ICD-300.00) ALLERGIC RHINITIS (ICD-477.9) HYPERCHOLESTEROLEMIA (ICD-272.0) EDEMA (ICD-782.3) OSTEOPOROSIS (ICD-733.00) OSTEOARTHRITIS (ICD-715.90) PERIPHERAL NEUROPATHY (ICD-356.9) GERD (ICD-530.81) DIABETES MELLITUS, TYPE I  (ICD-250.01)  Review of Systems  The patient denies weight loss and weight gain.    Physical Exam  General:  normal appearance.   Msk:  spine is nontender. Additional Exam:  Hemoglobin A1C       [H]  7.9 %    Impression & Recommendations:  Problem # 1:  DIABETES MELLITUS, TYPE I (ICD-250.01) this is the best control this pt should aim for, given this regimen, which does match insulin to her changing needs throughout the day  Problem # 2:  BACK PAIN, LUMBAR (ICD-724.2) Assessment: Unchanged  Problem # 3:  HYPERCHOLESTEROLEMIA (ICD-272.0) well-controlled  Medications Added to Medication List This Visit: 1)  Simvastatin 40 Mg Tabs (Simvastatin) .Marland Kitchen.. 1 tab at bedtime  Other Orders: TLB-A1C / Hgb A1C (Glycohemoglobin) (83036-A1C) Est. Patient Level IV ([address])  Patient Instructions: 1)  Please schedule a "medicare wellness" appointment in 3 months. 2)  blood tests are being ordered for you today.  please call [phone] to hear your test results. 3)  reduce simvastatin to 40 mg once daily.   4)  (update: i left message on phone-tree:  rx as we discussed) Prescriptions: SIMVASTATIN 40 MG TABS (SIMVASTATIN) 1 tab at bedtime  #90 x 3   Entered and Authorized by:   Minus Breeding MD   Signed by:   Minus Breeding MD on 02/13/2010   Method used:   Electronically to        [facility]* (retail)  [address]       [place], [place]  [address]       Ph: [phone]       Fax: [phone]   RxID:   [id number]

## 2010-08-14 NOTE — Letter (Signed)
Summary: New Patient letter  [facility]  [address] [place], [place] [address]   Phone: [phone]  Fax: [phone]       04/24/2010 MRN: [id number]  Tammy Hughes [address] [place], [place]  [address]  Dear Tammy Hughes,  Welcome to the Gastroenterology Division at [facility].    You are scheduled to see Dr.  Arlyce Dice on  04/26/2010 at  8:30 A.M.  on the 3rd floor at [facility], [address]  We ask that you try to arrive at our office 15 minutes prior to your appointment time to allow for check-in.  We would like you to complete the enclosed self-administered evaluation form prior to your visit and bring it with you on the day of your appointment.  We will review it with you.  Also, please bring a complete list of all your medications or, if you prefer, bring the medication bottles and we will list them.  Please bring your insurance card so that we may make a copy of it.  If your insurance requires a referral to see a specialist, please bring your referral form from your primary care physician.  Co-payments are due at the time of your visit and may be paid by cash, check or credit card.     Your office visit will consist of a consult with your physician (includes a physical exam), any laboratory testing he/she may order, scheduling of any necessary diagnostic testing (e.g. x-ray, ultrasound, CT-scan), and scheduling of a procedure (e.g. Endoscopy, Colonoscopy) if required.  Please allow enough time on your schedule to allow for any/all of these possibilities.    If you cannot keep your appointment, please call [phone] to cancel or reschedule prior to your appointment date.  This allows [place] the opportunity to schedule an appointment for another patient in need of care.  If you do not cancel or reschedule by 5 p.m. the business day prior to your appointment date, you will be charged a $50.00 late cancellation/no-show fee.    Thank you for choosing [facility] for your medical needs.  We appreciate the opportunity to care for you.  Please visit [place] at our website  to learn more about our practice.                     Sincerely,                                                             The Gastroenterology Division

## 2010-08-14 NOTE — Procedures (Signed)
Summary: Colonoscopy  Patient: Tammy Hughes Note: All result statuses are Final unless otherwise noted.  Tests: (1) Colonoscopy (COL)   COL Colonoscopy           DONE     [facility]     [address]     [place], [place]  [address]           COLONOSCOPY PROCEDURE REPORT           PATIENT:  Tammy Hughes, Tammy Hughes  MR#:  [id number]     BIRTHDATE:  1933-05-06, 77 yrs. old  GENDER:  female           ENDOSCOPIST:  Barbette Hair. Arlyce Dice, MD     Referred by:  Cleophas Dunker Everardo All, M.D.           PROCEDURE DATE:  05/24/2010     PROCEDURE:  Diagnostic Colonoscopy     ASA CLASS:  Class II     INDICATIONS:  1) rectal bleeding           MEDICATIONS:   Fentanyl 50 mcg IV, Versed 6 mg IV           DESCRIPTION OF PROCEDURE:   After the risks benefits and     alternatives of the procedure were thoroughly explained, informed     consent was obtained.  Digital rectal exam was performed and     revealed small external hemorrhoids, no abnormalities.   The [id number]     [id number] endoscope was introduced through the anus and advanced to     the cecum, which was identified by both the appendix and ileocecal     valve, without limitations.  The quality of the prep was good,     using MoviPrep.  The instrument was then slowly withdrawn as the     colon was fully examined.     <<PROCEDUREIMAGES>>           FINDINGS:  Moderate diverticulosis was found in the sigmoid colon     (see image12).  Scattered diverticula were found (see image6).     descending to ascending colon  Internal hemorrhoids were found     (see image13).  This was otherwise a normal examination of the     colon (see image4, image5, image8, and image9).   Retroflexed     views in the rectum revealed hemorrhoids.    The time to cecum =     9.50  minutes. The scope was then withdrawn (time =  6.0  min) from     the patient and the procedure completed.           COMPLICATIONS:  None           ENDOSCOPIC IMPRESSION:     1) Moderate  diverticulosis in the sigmoid colon     2) Diverticula, scattered     3) Internal hemorrhoids     4) Otherwise normal examination           Limited rectal bleeding secondary to hemorrhoids           RECOMMENDATIONS:     1) Anusol HC supp as needed for bleeding     2) warm soaks           REPEAT EXAM:  No           ______________________________     Barbette Hair. Arlyce Dice, MD           CC:  n.     eSIGNED:   Barbette Hair. Kaplan at 05/24/2010 03:01 PM           Blane Ohara, [id number]  Note: An exclamation mark (!) indicates a result that was not dispersed into the flowsheet. Document Creation Date: 05/24/2010 3:02 PM _______________________________________________________________________  (1) Order result status: Final Collection or observation date-time: 05/24/2010 14:52 Requested date-time:  Receipt date-time:  Reported date-time:  Referring Physician:   Ordering Physician: Melvia Heaps [phone]) Specimen Source:  Source: Launa Grill Order Number: [id number] Lab site:

## 2010-08-14 NOTE — Progress Notes (Signed)
Summary: Call Report/SAE pt  Phone Note Other Incoming   Caller: [facility] Summary of Call: [facility] Triage Call Report Triage Record Num: [id number] Operator: Geanie Berlin Patient Name: Tammy Hughes Call Date & Time: 04/21/2010 4:42:17PM Patient Phone: [phone] PCP: Romero Belling Patient Gender: Female PCP Fax : [phone] Patient DOB: October 31, 1932 Practice Name: [facility] Reason for Call: 77 yo calling re small amt bright red rectal bleeding following constipated stool. Onset: 04/19/10. Afebrile/tactile. Reports loose stool after eating "certian things." FBS 214 04/20/10 0800 and 87 at 2300 04/20/10. Blood sugar 296 1650, 1 hr after eating. Intermittent lower abdominal pain present when eats "rough or greasy food." Afraid to eat. Recently began Omprazole for GERD. Advised to see [facility] for symptoms began after beginning new RX per Diabetes: GI Problems Guideline. Protocol(s) Used: Diabetes: Gastrointestinal Problems Recommended Outcome per Protocol: See Provider within 24 hours Override Outcome if Used in Protocol: See ED Immediately RN Reason for Override Outcome: Office Is Closed. Reason for Outcome: Symptoms began after beginning new prescription or non-prescription medication(s) or therapy prescribed by provider Care Advice:  ~ Test your blood sugar before driving. Do not drive if blood sugar 70 mg/dl or less.  ~ SYMPTOM / CONDITION MANAGEMENT  ~ List, or take, all current prescription(s), nonprescription or alternative medication(s) to provider for evaluation. Medication Advice: - Discontinue all nonprescription and alternative medications, especially stimulants, until evaluated by provider. - Take prescribed medications as directed, following label instructions for the medication. - Do not change medications or dosing regimen until provider is consulted. - Know possible side effects of medication and what to do if they occur. - Tell provider all  prescription, nonprescription or alternative medications that you take  ~ Diarrheal Care: - Drink 2-3 quarts (2-3 liters) per day of low sugar content fluids, in Initial call taken by: Margaret Pyle, CMA,  April 23, 2010 8:29 AM  Follow-up for Phone Call        noted - thanks Follow-up by: Newt Lukes MD,  April 23, 2010 8:39 AM

## 2010-08-14 NOTE — Progress Notes (Signed)
Summary: omeprazole  Phone Note Refill Request Message from:  Fax from Pharmacy on April 10, 2010 4:18 PM  Refills Requested: Medication #1:  PRILOSEC 20 MG CPDR 1 tab once daily.   Dosage confirmed as above?Dosage Confirmed  Method Requested: Fax to Local Pharmacy Initial call taken by: Brenton Grills MA,  April 10, 2010 4:18 PM    Prescriptions: PRILOSEC 20 MG CPDR (OMEPRAZOLE) 1 tab once daily  #30 x 3   Entered by:   Brenton Grills MA   Authorized by:   Minus Breeding MD   Signed by:   Brenton Grills MA on 04/10/2010   Method used:   Faxed to ...       [facility]* (retail)       [address]       [place], [place]  [address]       Ph: [phone]       Fax: [phone]   RxID:   [id number]

## 2010-08-14 NOTE — Medication Information (Signed)
Summary: Diabetes Supplies/[facility]  Diabetes Supplies/[facility]   Imported By: Sherian Rein 02/02/2010 11:09:24  _____________________________________________________________________  External Attachment:    Type:   Image     Comment:   External Document

## 2010-08-14 NOTE — Assessment & Plan Note (Signed)
Summary: RECTAL BLEEDING AT TIMES/NWS   Vital Signs:  Patient profile:   77 year old female Height:      60 inches (152.40 cm) Weight:      163.25 pounds (74.20 kg) BMI:     32.00 O2 Sat:      94 % on Room air Temp:     98.0 degrees F (36.67 degrees C) oral Pulse rate:   61 / minute BP sitting:   138 / 78  (left arm) Cuff size:   regular  Vitals Entered By: Brenton Grills MA (March 21, 2010 8:09 AM)  O2 Flow:  Room air CC: rectal bleeding/aj Is Patient Diabetic? Yes   Primary Provider:  Minus Breeding MD  CC:  rectal bleeding/aj.  History of Present Illness: 1 month of slight bleeding from the rectum.  no assoc pain.  Current Medications (verified): 1)  Calcium 600/vitamin D 600-200 Mg-Unit  Tabs (Calcium Carbonate-Vitamin D) .... Take 1 By Mouth Once Daily 2)  Humalog Mix 75/25 Kwikpen 75-25 %  Susp (Insulin Lispro Prot & Lispro) .... 46 Units Each Am and 49 With The Evening Meal 3)  Lasix 20 Mg  Tabs (Furosemide) .... Once Daily 4)  Lisinopril 10 Mg Tabs (Lisinopril) .Marland Kitchen.. 1 By Mouth Once Daily 5)  Hydrocodone-Acetaminophen 5-500 Mg Tabs (Hydrocodone-Acetaminophen) .Marland Kitchen.. 1 Q4h As Needed Pain 6)  Simvastatin 40 Mg Tabs (Simvastatin) .Marland Kitchen.. 1 Tab At Bedtime  Allergies (verified): 1)  ! * Asprin  Past History:  Past Medical History: Last updated: 11/02/2009 Urolithiasis HYPERTENSION (ICD-401.9) DIVERTICULOSIS, COLON (ICD-562.10) ANXIETY (ICD-300.00) ALLERGIC RHINITIS (ICD-477.9) HYPERCHOLESTEROLEMIA (ICD-272.0) EDEMA (ICD-782.3) OSTEOPOROSIS (ICD-733.00) OSTEOARTHRITIS (ICD-715.90) PERIPHERAL NEUROPATHY (ICD-356.9) GERD (ICD-530.81) DIABETES MELLITUS, TYPE I (ICD-250.01)  Review of Systems  The patient denies weight loss.         slight diffuse abd pain, burning-type  Physical Exam  General:  normal appearance.   Abdomen:  abdomen is soft, nontender.  no hepatosplenomegaly.   not distended.  no hernia  Rectal:  normal external and internal exam,  except for a few non-bleeding external hemorrhoids. Additional Exam:  Hemoglobin                14.1 g/dL                   16.1-09.6 Hematocrit                41.0 %     Impression & Recommendations:  Problem # 1:  RECTAL BLEEDING (ICD-569.3) Assessment New  Problem # 2:  ABDOMINAL PAIN (ICD-789.00) uncertain etiology  Medications Added to Medication List This Visit: 1)  Prilosec 20 Mg Cpdr (Omeprazole) .Marland Kitchen.. 1 tab once daily  Other Orders: TLB-CBC Platelet - w/Differential (85025-CBCD) TLB-PT (Protime) (85610-PTP) Est. Patient Level IV ([address])  Patient Instructions: 1)  refer gastroenterology.  you will be called with a day and time for an appointment. 2)  blood tests are being ordered for you today.  please call [phone] to hear your test results. 3)  try prilosec 20 mg once daily.  here are some samples. 4)  (update: i left message on phone-tree:  rx as we discussed)

## 2010-08-14 NOTE — Assessment & Plan Note (Signed)
Summary: back problem/nws   Vital Signs:  Patient profile:   77 year old female Height:      60 inches (152.40 cm) Weight:      164.4 pounds (74.73 kg) O2 Sat:      97 % on Room air Temp:     97.0 degrees F (36.11 degrees C) oral Pulse rate:   83 / minute BP sitting:   100 / 60  (left arm) Cuff size:   regular  Vitals Entered By: Orlan Leavens (December 19, 2009 1:11 PM)  O2 Flow:  Room air CC: ongoing back problems Is Patient Diabetic? Yes Did you bring your meter with you today? No Pain Assessment Patient in pain? yes     Location: lower back Type: aching   Primary Provider:  Minus Breeding MD  CC:  ongoing back problems.  History of Present Illness: pt says she is now more certain that the pain is coming from her lower back.  it radiates to her legs.  no assoc numbness.  vicodin helps the pain temporarily.    Current Medications (verified): 1)  Calcium 600/vitamin D 600-200 Mg-Unit  Tabs (Calcium Carbonate-Vitamin D) .... Take 1 By Mouth Once Daily 2)  Humalog Mix 75/25 Kwikpen 75-25 %  Susp (Insulin Lispro Prot & Lispro) .... 46 Units Each Am and 49 With The Evening Meal 3)  Lasix 20 Mg  Tabs (Furosemide) .... Once Daily 4)  Lisinopril 10 Mg Tabs (Lisinopril) .Marland Kitchen.. 1 By Mouth Once Daily 5)  Hydrocodone-Acetaminophen 5-500 Mg Tabs (Hydrocodone-Acetaminophen) .Marland Kitchen.. 1 Q4h As Needed Pain 6)  Simvastatin 80 Mg Tabs (Simvastatin) .Marland Kitchen.. 1 Once Daily 7)  Promethazine Hcl 12.5 Mg Tabs (Promethazine Hcl) .Marland Kitchen.. 1-2 Every 4 Hrs As Needed For Nausea  Allergies (verified): 1)  ! * Asprin  Past History:  Past Medical History: Last updated: 11/02/2009 Urolithiasis HYPERTENSION (ICD-401.9) DIVERTICULOSIS, COLON (ICD-562.10) ANXIETY (ICD-300.00) ALLERGIC RHINITIS (ICD-477.9) HYPERCHOLESTEROLEMIA (ICD-272.0) EDEMA (ICD-782.3) OSTEOPOROSIS (ICD-733.00) OSTEOARTHRITIS (ICD-715.90) PERIPHERAL NEUROPATHY (ICD-356.9) GERD (ICD-530.81) DIABETES MELLITUS, TYPE I (ICD-250.01)  Review of  Systems  The patient denies fever.         abd pain is improved.  Physical Exam  General:  normal appearance.   Msk:  spine is nontender strength is normal throughout the lower extremities. Neurologic:  sensation is intact to touch on the legs Additional Exam:  Sed Rate                  21 mm/hr         Impression & Recommendations:  Problem # 1:  BACK PAIN, LUMBAR (ICD-724.2) prob due to oa  Other Orders: TLB-Sedimentation Rate (ESR) (85652-ESR) Radiology Referral (Radiology) Est. Patient Level III ([address])  Patient Instructions: 1)  blood tests are being ordered for you today.  please call [phone] to hear your test results. 2)  check mri of the spine.  you will be called with a day and time for an appointment. 3)  continue vicodin as needed for the pain 4)  (update: i left message on phone-tree:  rx as we discussed)

## 2010-08-14 NOTE — Assessment & Plan Note (Signed)
Summary: ongoing stomach and back problems/rx not working-lb   Vital Signs:  Patient profile:   77 year old female Height:      60 inches (152.40 cm) Weight:      159.25 pounds (72.39 kg) BMI:     31.21 O2 Sat:      96 % on Room air Temp:     97.9 degrees F (36.61 degrees C) oral Pulse rate:   73 / minute BP sitting:   124 / 74  (left arm) Cuff size:   large  Vitals Entered By: Josph Macho RMA (Nov 21, 2009 1:44 PM)  O2 Flow:  Room air CC: Ongoing stomach and back problems- RX not working (Metronidazole 500mg )/ CF Is Patient Diabetic? Yes   Primary Provider:  Minus Breeding MD  CC:  Ongoing stomach and back problems- RX not working (Metronidazole 500mg )/ CF.  History of Present Illness: pain is not improved.  it is worst at the right flank and ruq.  it radiates down the front of the right thigh.   no cbg record, but states cbg's are well-controlled.  it is lowest in am, and highest before the evening meal.  Current Medications (verified): 1)  Calcium 600/vitamin D 600-200 Mg-Unit  Tabs (Calcium Carbonate-Vitamin D) .... Take 1 By Mouth Once Daily 2)  Humalog Mix 75/25 Kwikpen 75-25 %  Susp (Insulin Lispro Prot & Lispro) .... 46 Units Each Am and 49 With The Evening Meal 3)  Lasix 20 Mg  Tabs (Furosemide) .... Once Daily 4)  Lisinopril 10 Mg Tabs (Lisinopril) .Marland Kitchen.. 1 By Mouth Once Daily 5)  Hydrocodone-Acetaminophen 5-500 Mg Tabs (Hydrocodone-Acetaminophen) .Marland Kitchen.. 1 Q4h As Needed Pain 6)  Simvastatin 80 Mg Tabs (Simvastatin) .Marland Kitchen.. 1 Once Daily 7)  Meloxicam 7.5 Mg Tabs (Meloxicam) .Marland Kitchen.. 1 By Mouth  Every Morning As Needed For Pain  Allergies (verified): 1)  ! * Asprin  Past History:  Past Medical History: Last updated: 11/02/2009 Urolithiasis HYPERTENSION (ICD-401.9) DIVERTICULOSIS, COLON (ICD-562.10) ANXIETY (ICD-300.00) ALLERGIC RHINITIS (ICD-477.9) HYPERCHOLESTEROLEMIA (ICD-272.0) EDEMA (ICD-782.3) OSTEOPOROSIS (ICD-733.00) OSTEOARTHRITIS  (ICD-715.90) PERIPHERAL NEUROPATHY (ICD-356.9) GERD (ICD-530.81) DIABETES MELLITUS, TYPE I (ICD-250.01)  Review of Systems       The patient complains of weight gain.  The patient denies hypoglycemia and fever.         little if any diarrhea.  no numbness.  Physical Exam  General:  normal appearance.   Abdomen:  abdomen is soft, nontender.  no hepatosplenomegaly.   not distended.  no hernia  Extremities:  no edema Additional Exam:   Hemoglobin A1C       [H]  7.4 %  (other labs are normal)   Impression & Recommendations:  Problem # 1:  ABDOMINAL PAIN (ICD-789.00) Assessment Deteriorated uncertain etiology. i don't do non-contrast ct because even if she had a renal stone, it would not necessarily be the cause of her pain.  Problem # 2:  DIABETES MELLITUS, TYPE I (ICD-250.01) well-controlled  Medications Added to Medication List This Visit: 1)  Ondansetron 4 Mg Tbdp (Ondansetron) .Marland Kitchen.. 1 every 4 hrs as needed for nausea  Other Orders: Radiology Referral (Radiology) TLB-A1C / Hgb A1C (Glycohemoglobin) (83036-A1C) TLB-Amylase (82150-AMYL) TLB-CBC Platelet - w/Differential (85025-CBCD) TLB-BMP (Basic Metabolic Panel-BMET) (80048-METABOL) TLB-Hepatic/Liver Function Pnl (80076-HEPATIC) Prescription Created Electronically [phone]) Est. Patient Level IV (78469)  Patient Instructions: 1)  check ct scan.  you will be called with a day and time for an appointment 2)  blood tests today. 3)  change meloxicam to vicodin 1 every  4 hrs as needed for pain. 4)  ondansetron 4 mg every 4 hrs as needed for nausea. 5)  call next week if you are not feeling better. 6)  (update: i left message on phone-tree:  rx as we discussed) Prescriptions: ONDANSETRON 4 MG TBDP (ONDANSETRON) 1 every 4 hrs as needed for nausea  #36 x 0   Entered and Authorized by:   Minus Breeding MD   Signed by:   Minus Breeding MD on 11/21/2009   Method used:   Electronically to        [facility]* (retail)        [address]       [place], [place]  [address]       Ph: [phone]       Fax: [phone]   RxID:   [id number] HYDROCODONE-ACETAMINOPHEN 5-500 MG TABS (HYDROCODONE-ACETAMINOPHEN) 1 q4h as needed pain  #50 x 1   Entered and Authorized by:   Minus Breeding MD   Signed by:   Minus Breeding MD on 11/21/2009   Method used:   Print then Give to Patient   RxID:   [id number]

## 2010-08-14 NOTE — Letter (Signed)
Summary: Medicare Audit/[facility]  Medicare Audit/[facility]   Imported By: Lester [place] 01/25/2010 08:06:27  _____________________________________________________________________  External Attachment:    Type:   Image     Comment:   External Document

## 2010-08-14 NOTE — Miscellaneous (Signed)
Summary: anusol rx.  Clinical Lists Changes  Medications: Added new medication of ANUSOL-HC 25 MG  SUPP (HYDROCORTISONE ACETATE) anusol hc supp. as needed for bleeding.  Appended Document: anusol rx.    Clinical Lists Changes  Medications: Rx of ANUSOL-HC 25 MG  SUPP (HYDROCORTISONE ACETATE) anusol hc supp. as needed for bleeding.;  #30 x 1;  Signed;  Entered by: Lowry Ram NCMA;  Authorized by: Louis Meckel MD;  Method used: Electronically to [facility] [address], [place], [place]  [address], Ph: [phone], Fax: [phone]    Prescriptions: ANUSOL-HC 25 MG  SUPP (HYDROCORTISONE ACETATE) anusol hc supp. as needed for bleeding.  #30 x 1   Entered by:   Lowry Ram NCMA   Authorized by:   Louis Meckel MD   Signed by:   Lowry Ram NCMA on 05/25/2010   Method used:   Electronically to        [facility]* (retail)       [address]       [place], [place]  [address]       Ph: [phone]       Fax: [phone]   RxID:   [id number]

## 2010-08-14 NOTE — Assessment & Plan Note (Signed)
Summary: BACK PAIN AND STOMACH PAIN/NWS  #   Vital Signs:  Patient profile:   77 year old female Height:      60 inches Weight:      163.12 pounds O2 Sat:      97 % on Room air Temp:     98.0 degrees F oral Pulse rate:   60 / minute BP sitting:   110 / 60  (left arm)  Vitals Entered By: Orlan Leavens (November 02, 2009 11:16 AM)  O2 Flow:  Room air CC: Back & stomach pain, Back pain Is Patient Diabetic? Yes Did you bring your meter with you today? No Pain Assessment Patient in pain? yes     Location: Back & stomach Type: aching   Primary Care Provider:  Minus Breeding MD  CC:  Back & stomach pain and Back pain.  History of Present Illness:  Back Pain      This is a 77 year old woman who presents with Back pain.  The symptoms began 2 months ago.  The intensity is described as moderate. Course has been progressive.  no falls or injury recalled prior to onset of pain.  The patient denies fever, weakness, and loss of sensation.  The pain is located in the left low back.  The pain began gradually.  The pain radiates to the left anterior abdomen.  The pain is made worse by activity and meals/eating.  The pain is made better by inactivity.  Risk factors for serious underlying conditions include duration of pain > 1 month and age >= 50 years.  +history of kidney stones. ?if symptoms strted after change in cholesterol medication to generic high dose simvastatin.  Clinical Review Panels:  Lipid Management   Cholesterol:  125 (02/15/2009)   LDL (bad choesterol):  62 (02/15/2009)   HDL (good cholesterol):  41.50 (02/15/2009)  CBC   WBC:  4.3 (02/15/2009)   RBC:  4.38 (02/15/2009)   Hgb:  13.5 (02/15/2009)   Hct:  40.8 (02/15/2009)   Platelets:  241.0 (02/15/2009)   MCV  93.2 (02/15/2009)   MCHC  33.1 (02/15/2009)   RDW  11.9 (02/15/2009)   PMN:  52.5 (02/15/2009)   Lymphs:  36.9 (02/15/2009)   Monos:  7.1 (02/15/2009)   Eosinophils:  2.2 (02/15/2009)   Basophil:  1.3  (02/15/2009)  Complete Metabolic Panel   Glucose:  187 (02/15/2009)   Sodium:  142 (02/15/2009)   Potassium:  4.5 (02/15/2009)   Chloride:  108 (02/15/2009)   CO2:  30 (02/15/2009)   BUN:  15 (02/15/2009)   Creatinine:  0.8 (02/15/2009)   Albumin:  3.7 (02/15/2009)   Total Protein:  7.2 (02/15/2009)   Calcium:  9.5 (02/15/2009)   Total Bili:  0.9 (02/15/2009)   Alk Phos:  63 (02/15/2009)   SGPT (ALT):  32 (02/15/2009)   SGOT (AST):  31 (02/15/2009)   Current Medications (verified): 1)  Calcium 600/vitamin D 600-200 Mg-Unit  Tabs (Calcium Carbonate-Vitamin D) .... Take 1 By Mouth Qd 2)  Humalog Mix 75/25 Kwikpen 75-25 %  Susp (Insulin Lispro Prot & Lispro) .... 46 Units Each Am and 49 With The Evening Meal 3)  Lasix 20 Mg  Tabs (Furosemide) .... Qd 4)  Lisinopril 10 Mg Tabs (Lisinopril) .Marland Kitchen.. 1 By Mouth Once Daily 5)  Hydrocodone-Acetaminophen 5-500 Mg Tabs (Hydrocodone-Acetaminophen) .Marland Kitchen.. 1 Q4h As Needed Pain 6)  Simvastatin 80 Mg Tabs (Simvastatin) .Marland Kitchen.. 1 Once Daily  Allergies (verified): 1)  ! * Asprin  Past  History:  Past Medical History: Urolithiasis HYPERTENSION (ICD-401.9) DIVERTICULOSIS, COLON (ICD-562.10) ANXIETY (ICD-300.00) ALLERGIC RHINITIS (ICD-477.9) HYPERCHOLESTEROLEMIA (ICD-272.0) EDEMA (ICD-782.3) OSTEOPOROSIS (ICD-733.00) OSTEOARTHRITIS (ICD-715.90) PERIPHERAL NEUROPATHY (ICD-356.9) GERD (ICD-530.81) DIABETES MELLITUS, TYPE I (ICD-250.01)  Review of Systems       The patient complains of abdominal pain.  The patient denies fever, weight loss, chest pain, syncope, incontinence, muscle weakness, suspicious skin lesions, and difficulty walking.    Physical Exam  General:  alert, well-developed, well-nourished, and cooperative to examination.    Lungs:  normal respiratory effort, no intercostal retractions or use of accessory muscles; normal breath sounds bilaterally - no crackles and no wheezes.    Heart:  normal rate, regular rhythm, no murmur, and  no rub. BLE without edema. Abdomen:  soft, non-tender, normal bowel sounds, no distention; no masses and no appreciable hepatomegaly or splenomegaly.   Msk:  back: full range of motion of lumbar spine. Nontender to palpation. Negative straight leg raise. Deep tendon reflexes symmetrically intact at Achilles and patella, negative clonus. Sensation intact throughout all dermatomes in bilateral lower extremities. Full strength to manual muscle testing in all major muscule groups including EHL, anterior tibialis, gastrocnemius, quadriceps, and iliopsoas. Able to heel and toe walk without difficulty and ambulates with a normal gait.    Impression & Recommendations:  Problem # 1:  BACK PAIN, LEFT (ICD-724.5) mskel and neuro and abd exam benign - no pain present at this time and generally relieved with tylenol - check labs r/o adv SE of statin given onset symptoms after med change - check lumbar spine r/o compression fx given hx osteopenia also ?hx kidney stone - consider CT a/p if lab/xray neg and persisiting symptoms  e-rx for low dose meloxicam to use as needed alt with tylenol Her updated medication list for this problem includes:    Hydrocodone-acetaminophen 5-500 Mg Tabs (Hydrocodone-acetaminophen) .Marland Kitchen... 1 q4h as needed pain    Meloxicam 7.5 Mg Tabs (Meloxicam) .Marland Kitchen... 1 by mouth  every morning as needed for pain  Orders: T-Lumbar Spine 2 Views (72100TC) Prescription Created Electronically [id number]) TLB-CK Total Only(Creatine Kinase/CPK) (82550-CK) TLB-Hepatic/Liver Function Pnl (80076-HEPATIC) TLB-Udip w/ Micro (81001-URINE)  Problem # 2:  HYPERCHOLESTEROLEMIA (ICD-272.0) take 1/2 dose x 4 days while awaiting return of these tests - Her updated medication list for this problem includes:    Simvastatin 80 Mg Tabs (Simvastatin) .Marland Kitchen... 1 once daily  Orders: TLB-CK Total Only(Creatine Kinase/CPK) (82550-CK) TLB-Hepatic/Liver Function Pnl (80076-HEPATIC) TLB-Udip w/ Micro (81001-URINE)  Labs  Reviewed: SGOT: 31 (02/15/2009)   SGPT: 32 (02/15/2009)   HDL:41.50 (02/15/2009), 42.4 (04/24/2007)  LDL:62 (02/15/2009), 60 (04/24/2007)  Chol:125 (02/15/2009), 121 (04/24/2007)  Trig:110.0 (02/15/2009), 94 (04/24/2007)  Complete Medication List: 1)  Calcium 600/vitamin D 600-200 Mg-unit Tabs (Calcium carbonate-vitamin d) .... Take 1 by mouth once daily 2)  Humalog Mix 75/25 Kwikpen 75-25 % Susp (Insulin lispro prot & lispro) .... 46 units each am and 49 with the evening meal 3)  Lasix 20 Mg Tabs (Furosemide) .... Once daily 4)  Lisinopril 10 Mg Tabs (Lisinopril) .Marland Kitchen.. 1 by mouth once daily 5)  Hydrocodone-acetaminophen 5-500 Mg Tabs (Hydrocodone-acetaminophen) .Marland Kitchen.. 1 q4h as needed pain 6)  Simvastatin 80 Mg Tabs (Simvastatin) .Marland Kitchen.. 1 once daily 7)  Meloxicam 7.5 Mg Tabs (Meloxicam) .Marland Kitchen.. 1 by mouth  every morning as needed for pain  Patient Instructions: 1)  it was good to see you today. 2)  test(s) ordered today - your results will be called to you in 48-72 hours from the time of test  completion - 3)  take 1/2 tablet of simvastatin for 4 days, then resume whole tablet unless we find and problems on your labs today - 4)  try meloxicam for pain as discussed - your prescriptions have been electronically submitted to your pharmacy. Please take as directed. Contact our office if you believe you're having problems with the medication(s). Can use this with tylenol if needed Prescriptions: MELOXICAM 7.5 MG TABS (MELOXICAM) 1 by mouth  every morning as needed for pain  #30 x 1   Entered and Authorized by:   Newt Lukes MD   Signed by:   Newt Lukes MD on 11/02/2009   Method used:   Electronically to        [facility]* (retail)       [address]       [place], [place]  [address]       Ph: [phone]       Fax: [phone]   RxID:   [id number]

## 2010-08-14 NOTE — Progress Notes (Signed)
Summary: Rectal Bleed   Phone Note From Other Clinic   Caller: Debra [phone] @ Dr Everardo All Call For: Dr Arlyce Dice Reason for Call: Schedule Patient Appt Summary of Call: Had EGD back in 2003 with Dr Arlyce Dice. Dr Everardo All referring patient for rectal bleeding but no appointments available.  Initial call taken by: Leanor Kail [facility],  April 24, 2010 8:35 AM  Follow-up for Phone Call        NP3 scheduled for 04/26/10 8:30.  Debra notified Follow-up by: Darcey Nora RN, CGRN,  April 24, 2010 9:20 AM

## 2010-08-14 NOTE — Progress Notes (Signed)
Summary: Diabete supplies  Phone Note Outgoing Call   Summary of Call: Received more papework from diabetic groups saying pt requested them for her diabetic supplies. I spoke with pt and informed her that we faxed paperwork into [employer]. This is the 3rd different company sending [place] paperwork, so I informed pt that she would need to have the companies send the paperwork to her and bring them in with her on her next scheduled visit. Initial call taken by: Josph Macho CMA,  July 21, 2009 8:21 AM     Appended Document: Diabete supplies Spoke with pt again to double check she didn't want product through [facility].Pt stated she doesn't want supplies from them. Spoke with Adam at [employer] and he said they would take pts name off of there list.

## 2010-08-14 NOTE — Progress Notes (Signed)
Summary: referral/[facility] pt  Phone Note Call from Patient Call back at Home Phone [phone]   Caller: Patient Summary of Call: Pt called requesting referral to GI per ED MD, see previous note. Initial call taken by: Margaret Pyle, CMA,  April 23, 2010 8:51 AM  Follow-up for Phone Call        ok - refer to [facility] ordered - thanks Follow-up by: Newt Lukes MD,  April 23, 2010 8:56 AM  Additional Follow-up for Phone Call Additional follow up Details #1::        Pt informed and will expect a call from [facility] with appt info Additional Follow-up by: Margaret Pyle, CMA,  April 23, 2010 9:17 AM

## 2010-08-14 NOTE — Letter (Signed)
Summary: [facility] Instructions  [facility]  [address] [place], [place] [address]   Phone: [phone]  Fax: [phone]       Taquita ELTRINGHAM    October 09, 1932    MRN: [id number]        Procedure Day /Date:TUESDAY 05/24/2010     Arrival Time:1:30PM     Procedure Time:2:30PM     Location of Procedure:                    X   [facility] (4th Floor)   PREPARATION FOR COLONOSCOPY WITH MOVIPREP   Starting 5 days prior to your procedure 11/5 do not eat nuts, seeds, popcorn, corn, beans, peas,  salads, or any raw vegetables.  Do not take any fiber supplements (e.g. Metamucil, Citrucel, and Benefiber).  THE DAY BEFORE YOUR PROCEDURE         DATE: 05/23/2010  DAY: MONDAY  1.  Drink clear liquids the entire day-NO SOLID FOOD  2.  Do not drink anything colored red or purple.  Avoid juices with pulp.  No orange juice.  3.  Drink at least 64 oz. (8 glasses) of fluid/clear liquids during the day to prevent dehydration and help the prep work efficiently.  CLEAR LIQUIDS INCLUDE: Water Jello Ice Popsicles Tea (sugar ok, no milk/cream) Powdered fruit flavored drinks Coffee (sugar ok, no milk/cream) Gatorade Juice: apple, white grape, white cranberry  Lemonade Clear bullion, consomm, broth Carbonated beverages (any kind) Strained chicken noodle soup Hard Candy                             4.  In the morning, mix first dose of MoviPrep solution:    Empty 1 Pouch A and 1 Pouch B into the disposable container    Add lukewarm drinking water to the top line of the container. Mix to dissolve    Refrigerate (mixed solution should be used within 24 hrs)  5.  Begin drinking the prep at 5:00 p.m. The MoviPrep container is divided by 4 marks.   Every 15 minutes drink the solution down to the next mark (approximately 8 oz) until the full liter is complete.   6.  Follow completed prep with 16 oz of clear liquid of your choice (Nothing red or purple).  Continue to  drink clear liquids until bedtime.  7.  Before going to bed, mix second dose of MoviPrep solution:    Empty 1 Pouch A and 1 Pouch B into the disposable container    Add lukewarm drinking water to the top line of the container. Mix to dissolve    Refrigerate  THE DAY OF YOUR PROCEDURE      DATE: 05/24/2010 DAY: TUESDAY  Beginning at 9:30a.m. (5 hours before procedure):         1. Every 15 minutes, drink the solution down to the next mark (approx 8 oz) until the full liter is complete.  2. Follow completed prep with 16 oz. of clear liquid of your choice.    3. You may drink clear liquids until 12:30PM (2 HOURS BEFORE PROCEDURE).   MEDICATION INSTRUCTIONS  Unless otherwise instructed, you should take regular prescription medications with a small sip of water   as early as possible the morning of your procedure.  Diabetic patients - see separate instructions.        OTHER INSTRUCTIONS  You will need a responsible adult at least 75  years of age to accompany you and drive you home.   This person must remain in the waiting room during your procedure.  Wear loose fitting clothing that is easily removed.  Leave jewelry and other valuables at home.  However, you may wish to bring a book to read or  an iPod/MP3 player to listen to music as you wait for your procedure to start.  Remove all body piercing jewelry and leave at home.  Total time from sign-in until discharge is approximately 2-3 hours.  You should go home directly after your procedure and rest.  You can resume normal activities the  day after your procedure.  The day of your procedure you should not:   Drive   Make legal decisions   Operate machinery   Drink alcohol   Return to work  You will receive specific instructions about eating, activities and medications before you leave.    The above instructions have been reviewed and explained to me by   _______________________    I fully understand and  can verbalize these instructions _____________________________ Date _________

## 2010-08-14 NOTE — Letter (Signed)
Summary: Diabetic Instructions  [facility]  [address] [place], [place] [address]   Phone: [phone]  Fax: [phone]    Tammy Hughes Nov 27, 1932 MRN: [id number]   _  _   ORAL DIABETIC MEDICATION INSTRUCTIONS  The day before your procedure:   Take your diabetic pill as you do normally  The day of your procedure:   Do not take your diabetic pill    We will check your blood sugar levels during the admission process and again in Recovery before discharging you home  ________________________________________________________________________  _  _   INSULIN (LONG ACTING) MEDICATION INSTRUCTIONS (Lantus, NPH, 70/30, Humulin, Novolin-N)   The day before your procedure:   Take  your regular evening dose    The day of your procedure:   Do not take your morning dose    X    INSULIN (SHORT ACTING) MEDICATION INSTRUCTIONS (Regular, Humulog, Novolog)   The day before your procedure:   Do not take your evening dose   The day of your procedure:   Do not take your morning dose   _  _   INSULIN PUMP MEDICATION INSTRUCTIONS  We will contact the physician managing your diabetic care for written dosage instructions for the day before your procedure and the day of your procedure.  Once we have received the instructions, we will contact you.

## 2010-08-14 NOTE — Progress Notes (Signed)
Summary: ALT med  Phone Note Call from Patient Call back at Home Phone [phone]   Caller: Patient Summary of Call: pt called stating that Metronidazole caused stomach upset and swelling of her feet. Pt d/c'd medication and is requesting an alt to [facility]. Initial call taken by: Margaret Pyle, CMA,  Nov 20, 2009 10:01 AM  Follow-up for Phone Call        meloxicam was used for back pain and can cause swelling; metronidazole was used for trich uti, can cause stomach upset but has no "substitution" to send in -i need to be sure which medication she is talking about - stop both if uncertain - thanks Follow-up by: Newt Lukes MD,  Nov 20, 2009 11:04 AM  Additional Follow-up for Phone Call Additional follow up Details #1::        per pt swelling is minor but stomach upset is what is bothering her, and it started once she started the ABX. I advised pt that there was no substitute. Pt wants to know if is would be okay to d/c med? Additional Follow-up by: Margaret Pyle, CMA,  Nov 20, 2009 2:15 PM    Additional Follow-up for Phone Call Additional follow up Details #2::    yes, stop the metronidazole -can recheck about UTI with dr. Everardo All at future OV if needed - thanks Follow-up by: Newt Lukes MD,  Nov 20, 2009 2:21 PM  Additional Follow-up for Phone Call Additional follow up Details #3:: Details for Additional Follow-up Action Taken: pt informed and will sch with [facility] for continued stomach and back pain. Additional Follow-up by: Margaret Pyle, CMA,  Nov 20, 2009 2:31 PM

## 2010-08-14 NOTE — Assessment & Plan Note (Signed)
Summary: SPRAINED SHOULDER/NWS   Vital Signs:  Patient profile:   77 year old female Height:      60 inches (152.40 cm) Weight:      165.13 pounds (75.06 kg) O2 Sat:      97 % on Room air Temp:     97.5 degrees F (36.39 degrees C) oral Pulse rate:   64 / minute BP sitting:   122 / 68  (left arm) Cuff size:   large  Vitals Entered By: Josph Macho CMA (July 18, 2009 8:12 AM)  O2 Flow:  Room air CC: Right Sprained shoulderX66month/ CF Is Patient Diabetic? Yes   Primary Provider:  Minus Breeding MD  CC:  Right Sprained shoulderX35month/ CF.  History of Present Illness: 1 month ago, pt was walking her dog, and the dog pulled on the leash.  she had severe pain at the right shoulder.  since then, there is only slight improvement.  pain is worse with lying on that shoulder.  the pain radiates down the flexor aspect of the right upper arm.  Current Medications (verified): 1)  Calcium 600/vitamin D 600-200 Mg-Unit  Tabs (Calcium Carbonate-Vitamin D) .... Take 1 By Mouth Qd 2)  Humalog Mix 75/25 Kwikpen 75-25 %  Susp (Insulin Lispro Prot & Lispro) .... 45 Units Qam and 50 Qpm 3)  Lasix 20 Mg  Tabs (Furosemide) .... Qd 4)  Vytorin 10-80 Mg  Tabs (Ezetimibe-Simvastatin) .... Qd 5)  Lisinopril 10 Mg Tabs (Lisinopril) .Marland Kitchen.. 1 By Mouth Once Daily 6)  Hydrocodone-Acetaminophen 5-500 Mg Tabs (Hydrocodone-Acetaminophen) .Marland Kitchen.. 1 Q4h As Needed Pain 7)  Cephalexin 250 Mg Tabs (Cephalexin) .Marland Kitchen.. 1 Tid 8)  Nitrofurantoin Macrocrystal 100 Mg Caps (Nitrofurantoin Macrocrystal) .Marland Kitchen.. 1 Tid  Allergies (verified): 1)  ! * Asprin  Past History:  Past Medical History: Last updated: 06/27/2008 Urolithiasis HYPERTENSION (ICD-401.9) DIVERTICULOSIS, COLON (ICD-562.10) ANXIETY (ICD-300.00) ALLERGIC RHINITIS (ICD-477.9) HYPERCHOLESTEROLEMIA (ICD-272.0) EDEMA (ICD-782.3) COUGH (ICD-786.2) URI (ICD-465.9) OSTEOPOROSIS (ICD-733.00) OSTEOARTHRITIS (ICD-715.90) PERIPHERAL NEUROPATHY (ICD-356.9) GERD  (ICD-530.81) DIABETES MELLITUS, TYPE I (ICD-250.01)  Review of Systems       denies numbness  Physical Exam  General:  no distress  Msk:  right shoulder: there is moderate anterior tenderness. abduction is limited to 45 degrees (even passively), by pain.   Pulses:  right radial is intact Neurologic:  sensation is intact to touch on the rue   Impression & Recommendations:  Problem # 1:  SHOULDER PAIN, RIGHT (ICD-719.41) uncertain etiology  Other Orders: T-Shoulder Right (73030TC) Est. Patient Level III (66440)  Patient Instructions: 1)  x ray today 2)  hydrocodone-apap, 1 every 4 hrs as needed pain. 3)  call if you are not getting better soon, and i would be happy to refer you to the orthopedic specialist. Prescriptions: HYDROCODONE-ACETAMINOPHEN 5-500 MG TABS (HYDROCODONE-ACETAMINOPHEN) 1 q4h as needed pain  #50 x 1   Entered and Authorized by:   Minus Breeding MD   Signed by:   Minus Breeding MD on 07/18/2009   Method used:   Print then Give to Patient   RxID:   [id number]

## 2010-08-14 NOTE — Letter (Signed)
Summary: Medical Info/[facility]  Medical Info/[facility]   Imported By: Sherian Rein 08/16/2009 09:37:31  _____________________________________________________________________  External Attachment:    Type:   Image     Comment:   External Document

## 2010-08-16 NOTE — Assessment & Plan Note (Signed)
Summary: cough.cold/cd   Vital Signs:  Patient profile:   77 year old female Height:      60 inches (152.40 cm) Weight:      163.38 pounds (74.26 kg) BMI:     32.02 O2 Sat:      96 % on Room air Temp:     97.5 degrees F (36.39 degrees C) oral Pulse rate:   75 / minute Pulse rhythm:   regular BP sitting:   138 / 66  (left arm) Cuff size:   regular  Vitals Entered By: Brenton Grills CMA Duncan Dull) (July 23, 2010 10:46 AM)  O2 Flow:  Room air CC: Reoccuring cough, worse at night, Elevated BP/aj Is Patient Diabetic? Yes   Primary Provider:  Romero Belling, MD  CC:  Reoccuring cough, worse at night, and Elevated BP/aj.  History of Present Illness: pt states 2 weeks of prod-quality cough, but no assoc sob.  she was seen at [facility].  she was rx'ed with cough med.  cxr was ok 3 weeks ago, and she was dx'ed with bronchitis. she says bp's at home have been slightly high.   she reports hearing loss at the left ear. no cbg record, but states cbg's are well-controlled.   Current Medications (verified): 1)  Calcium 600/vitamin D 600-200 Mg-Unit  Tabs (Calcium Carbonate-Vitamin D) .... Take 1 By Mouth Once Daily 2)  Humalog Mix 75/25 Kwikpen 75-25 %  Susp (Insulin Lispro Prot & Lispro) .... 46 Units Each Am and 49 With The Evening Meal 3)  Lasix 20 Mg  Tabs (Furosemide) .... Once Daily 4)  Lisinopril 10 Mg Tabs (Lisinopril) .Marland Kitchen.. 1 By Mouth Once Daily 5)  Hydrocodone-Acetaminophen 5-500 Mg Tabs (Hydrocodone-Acetaminophen) .Marland Kitchen.. 1 Q4h As Needed Pain 6)  Simvastatin 40 Mg Tabs (Simvastatin) .Marland Kitchen.. 1 Tab At Bedtime 7)  Prilosec 20 Mg Cpdr (Omeprazole) .Marland Kitchen.. 1 Tab Once Daily 8)  Hyomax-Sr 0.375 Mg Xr12h-Tab (Hyoscyamine Sulfate) .... Take One Tab Twice A Day As Needed For Abdominal Pain 9)  Anusol-Hc 25 Mg  Supp (Hydrocortisone Acetate) .... Anusol Hc Supp. As Needed For Bleeding.  Allergies (verified): 1)  ! * Asprin  Past History:  Past Medical History: Last updated:  11/02/2009 Urolithiasis HYPERTENSION (ICD-401.9) DIVERTICULOSIS, COLON (ICD-562.10) ANXIETY (ICD-300.00) ALLERGIC RHINITIS (ICD-477.9) HYPERCHOLESTEROLEMIA (ICD-272.0) EDEMA (ICD-782.3) OSTEOPOROSIS (ICD-733.00) OSTEOARTHRITIS (ICD-715.90) PERIPHERAL NEUROPATHY (ICD-356.9) GERD (ICD-530.81) DIABETES MELLITUS, TYPE I (ICD-250.01)  Social History: Reviewed history from 04/26/2010 and no changes required. 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         no ear pain.  denies hypoglycemia.  Physical Exam  General:  normal appearance.   Head:  head: no deformity eyes: no periorbital swelling, no proptosis external nose and ears are normal mouth: no lesion seen Ears:  TM's intact and clear with normal canals with grossly normal hearing.   Lungs:  Clear to auscultation bilaterally. Normal respiratory effort.  Additional Exam:   Hemoglobin A1C       [H]  8.0 %    Impression & Recommendations:  Problem # 1:  acute bronchitis Assessment Unchanged unchanged  Problem # 2:  DIABETES MELLITUS, TYPE I (ICD-250.01) needs increased rx. we'll follow for now  Problem # 3:  hearing loss new problem uncertain etiology  Problem # 4:  HYPERTENSION (ICD-401.9) with labile component.  Medications Added to Medication List This Visit: 1)  Humalog Mix 75/25 Kwikpen 75-25 %  Susp (Insulin lispro prot & lispro) .... 49 units two times a day 2)  Azithromycin 500 Mg Tabs (Azithromycin) .Marland Kitchen.. 1 tab once daily 3)  Promethazine-codeine 6.25-10 Mg/36ml Syrp (Promethazine-codeine) .Marland Kitchen.. 1 teaspoon every 4 hrs as needed for cough  Other Orders: TLB-A1C / Hgb A1C (Glycohemoglobin) (83036-A1C) Est. Patient Level V (72536)  Patient Instructions: 1)  blood tests are being ordered for you today.  please call [phone] to hear your test results. 2)  Please schedule a "medicare wellness" appointment  in 3 months. 3)  azithromycin 500 mg once daily. 4)  i am happy to refer you to a hearing specialist if you wish. 5)  for now, pleas continue same blood pressure medication. 6)  promethazine-codeine syrup, 1 teaspoon every 4 hrn as needed for cough. 7)  (update: i left message on phone-tree:  increase insulin to 49 units two times a day). Prescriptions: HUMALOG MIX 75/25 KWIKPEN 75-25 %  SUSP (INSULIN LISPRO PROT & LISPRO) 46 units each am and 49 with the evening meal  #1 box x 11   Entered and Authorized by:   Minus Breeding MD   Signed by:   Minus Breeding MD on 07/23/2010   Method used:   Print then Give to Patient   RxID:   [id number] HUMALOG MIX 75/25 KWIKPEN 75-25 %  SUSP (INSULIN LISPRO PROT & LISPRO) 46 units each am and 49 with the evening meal  #1 box x 11   Entered and Authorized by:   Minus Breeding MD   Signed by:   Minus Breeding MD on 07/23/2010   Method used:   Print then Give to Patient   RxID:   [id number] PROMETHAZINE-CODEINE 6.25-10 MG/5ML SYRP (PROMETHAZINE-CODEINE) 1 teaspoon every 4 hrs as needed for cough  #8 oz x 1   Entered and Authorized by:   Minus Breeding MD   Signed by:   Minus Breeding MD on 07/23/2010   Method used:   Print then Give to Patient   RxID:   [id number] AZITHROMYCIN 500 MG TABS (AZITHROMYCIN) 1 tab once daily  #6 x 0   Entered and Authorized by:   Minus Breeding MD   Signed by:   Minus Breeding MD on 07/23/2010   Method used:   Electronically to        [facility]* (retail)       [address]       [place], [place]  [address]       Ph: [phone]       Fax: [phone]   RxID:   [id number]    Orders Added: 1)  TLB-A1C / Hgb A1C (Glycohemoglobin) [83036-A1C] 2)  Est. Patient Level V [81829]   Immunization History:  Influenza Immunization History:    Influenza:  historical (03/15/2010)   Immunization History:  Influenza Immunization History:    Influenza:  Historical (03/15/2010)

## 2010-08-16 NOTE — Progress Notes (Signed)
Summary: rx refill req  Phone Note Refill Request Message from:  Fax from Pharmacy on June 28, 2010 11:00 AM  Refills Requested: Medication #1:  HUMALOG MIX 75/25 KWIKPEN 75-25 %  SUSP 46 units each am and 49 with the evening meal   Dosage confirmed as above?Dosage Confirmed   Last Refilled: 06/15/2010  Method Requested: Electronic Next Appointment Scheduled: none Initial call taken by: Brenton Grills CMA (AAMA),  June 28, 2010 11:00 AM    Prescriptions: HUMALOG MIX 75/25 KWIKPEN 75-25 %  SUSP (INSULIN LISPRO PROT & LISPRO) 46 units each am and 49 with the evening meal  #30 x 3   Entered by:   Brenton Grills CMA (AAMA)   Authorized by:   Minus Breeding MD   Signed by:   Brenton Grills CMA (AAMA) on 06/28/2010   Method used:   Electronically to        [facility]* (retail)       [address]       [place], [place]  [address]       Ph: [phone]       Fax: [phone]   RxID:   [id number]

## 2010-08-16 NOTE — Progress Notes (Signed)
Summary: OV due  Phone Note Outgoing Call Call back at [facility] Phone [phone]   Call placed by: Brenton Grills CMA Duncan Dull),  June 29, 2010 4:34 PM Call placed to: Patient Details for Reason: OV due Summary of Call: Per MD, pt is due for F/U for evaluation   Follow-up for Phone Call        Appointment schedule 07/18/2010 Follow-up by: Brenton Grills CMA Duncan Dull),  June 29, 2010 4:36 PM

## 2010-08-16 NOTE — Letter (Signed)
Summary: [facility] [place]  [facility] [place]   Imported By: Lester Shillington 06/25/2010 08:31:02  _____________________________________________________________________  External Attachment:    Type:   Image     Comment:   External Document

## 2010-09-11 ENCOUNTER — Telehealth: Payer: Self-pay | Admitting: Endocrinology

## 2010-09-20 NOTE — Progress Notes (Signed)
Summary: PA-Humalog  Phone Note From Pharmacy   Summary of Call: PA-Humalog is prior auth, Novolog is covered under pt plan. Do you want to do PA or change medication.   Initial call taken by: Dagoberto Reef,  September 11, 2010 4:11 PM  Follow-up for Phone Call        i changes and sent rx Follow-up by: Minus Breeding MD,  September 11, 2010 4:15 PM  Additional Follow-up for Phone Call Additional follow up Details #1::        Pt informed Additional Follow-up by: Margaret Pyle, CMA,  September 12, 2010 8:43 AM    New/Updated Medications: NOVOLOG MIX 70/30 FLEXPEN 70-30 % SUSP (INSULIN ASPART PROT & ASPART) 49 units two times a day, and pen needles two times a day Prescriptions: NOVOLOG MIX 70/30 FLEXPEN 70-30 % SUSP (INSULIN ASPART PROT & ASPART) 49 units two times a day, and pen needles two times a day  #7 boxes x 3   Entered and Authorized by:   Minus Breeding MD   Signed by:   Minus Breeding MD on 09/11/2010   Method used:   Electronically to        [facility]* (retail)             ,          Ph: [phone]       Fax: [phone]   RxID:   [id number]

## 2010-09-25 LAB — GLUCOSE, CAPILLARY: Glucose-Capillary: 129 mg/dL — ABNORMAL HIGH (ref 70–99)

## 2010-09-26 LAB — POCT I-STAT, CHEM 8
BUN: 16 mg/dL (ref 6–23)
Chloride: 105 mEq/L (ref 96–112)
Creatinine, Ser: 1 mg/dL (ref 0.4–1.2)
Potassium: 4.1 mEq/L (ref 3.5–5.1)
Sodium: 142 mEq/L (ref 135–145)

## 2010-09-26 LAB — URINALYSIS, ROUTINE W REFLEX MICROSCOPIC
Glucose, UA: >1000 mg/dL — AB
Hgb urine dipstick: NEGATIVE
Specific Gravity, Urine: 1.017 (ref 1.005–1.030)

## 2010-09-26 LAB — URINE MICROSCOPIC-ADD ON

## 2010-09-26 LAB — CBC
MCV: 92 fL (ref 78.0–100.0)
Platelets: 257 10*3/uL (ref 150–400)
RBC: 4.26 MIL/uL (ref 3.87–5.11)
WBC: 5 10*3/uL (ref 4.0–10.5)

## 2010-09-26 LAB — DIFFERENTIAL
Lymphocytes Relative: 32 % (ref 12–46)
Lymphs Abs: 1.6 10*3/uL (ref 0.7–4.0)
Neutrophils Relative %: 56 % (ref 43–77)

## 2010-09-26 LAB — GLUCOSE, CAPILLARY: Glucose-Capillary: 207 mg/dL — ABNORMAL HIGH (ref 70–99)

## 2010-10-31 ENCOUNTER — Other Ambulatory Visit: Payer: Self-pay | Admitting: Endocrinology

## 2010-11-16 ENCOUNTER — Telehealth: Payer: Self-pay | Admitting: Endocrinology

## 2010-11-16 NOTE — Telephone Encounter (Signed)
Appointment schedule 11/22/2009 1:00pm

## 2010-11-16 NOTE — Telephone Encounter (Signed)
Left message for pt to callback office.  

## 2010-11-23 ENCOUNTER — Encounter: Payer: Self-pay | Admitting: Endocrinology

## 2010-11-23 ENCOUNTER — Other Ambulatory Visit (INDEPENDENT_AMBULATORY_CARE_PROVIDER_SITE_OTHER): Payer: Medicare Other | Admitting: Endocrinology

## 2010-11-23 ENCOUNTER — Other Ambulatory Visit (INDEPENDENT_AMBULATORY_CARE_PROVIDER_SITE_OTHER): Payer: Medicare Other

## 2010-11-23 ENCOUNTER — Ambulatory Visit (INDEPENDENT_AMBULATORY_CARE_PROVIDER_SITE_OTHER): Payer: Medicare Other | Admitting: Endocrinology

## 2010-11-23 DIAGNOSIS — Z79899 Other long term (current) drug therapy: Secondary | ICD-10-CM

## 2010-11-23 DIAGNOSIS — Z13220 Encounter for screening for lipoid disorders: Secondary | ICD-10-CM

## 2010-11-23 DIAGNOSIS — F411 Generalized anxiety disorder: Secondary | ICD-10-CM

## 2010-11-23 DIAGNOSIS — G5602 Carpal tunnel syndrome, left upper limb: Secondary | ICD-10-CM

## 2010-11-23 DIAGNOSIS — G5600 Carpal tunnel syndrome, unspecified upper limb: Secondary | ICD-10-CM

## 2010-11-23 DIAGNOSIS — E109 Type 1 diabetes mellitus without complications: Secondary | ICD-10-CM

## 2010-11-23 DIAGNOSIS — K219 Gastro-esophageal reflux disease without esophagitis: Secondary | ICD-10-CM

## 2010-11-23 DIAGNOSIS — E7800 Pure hypercholesterolemia, unspecified: Secondary | ICD-10-CM

## 2010-11-23 DIAGNOSIS — I10 Essential (primary) hypertension: Secondary | ICD-10-CM

## 2010-11-23 DIAGNOSIS — D72819 Decreased white blood cell count, unspecified: Secondary | ICD-10-CM

## 2010-11-23 LAB — CBC WITH DIFFERENTIAL/PLATELET
Basophils Relative: 0.4 % (ref 0.0–3.0)
Eosinophils Relative: 3.1 % (ref 0.0–5.0)
Hemoglobin: 13 g/dL (ref 12.0–15.0)
Lymphocytes Relative: 33.8 % (ref 12.0–46.0)
MCHC: 34.4 g/dL (ref 30.0–36.0)
Monocytes Relative: 9.1 % (ref 3.0–12.0)
Neutro Abs: 3 10*3/uL (ref 1.4–7.7)
Neutrophils Relative %: 53.6 % (ref 43.0–77.0)
RBC: 4.14 Mil/uL (ref 3.87–5.11)
WBC: 5.5 10*3/uL (ref 4.5–10.5)

## 2010-11-23 LAB — BASIC METABOLIC PANEL
CO2: 29 mEq/L (ref 19–32)
GFR: 72.74 mL/min (ref >60.00)
Glucose, Bld: 161 mg/dL — ABNORMAL HIGH (ref 70–99)
Potassium: 4.4 mEq/L (ref 3.5–5.1)
Sodium: 144 mEq/L (ref 135–145)

## 2010-11-23 LAB — LIPID PANEL
HDL: 42.4 mg/dL (ref >39.00)
Triglycerides: 272 mg/dL — ABNORMAL HIGH (ref 0.0–149.0)
VLDL: 54.4 mg/dL — ABNORMAL HIGH (ref 0.0–40.0)

## 2010-11-23 LAB — MICROALBUMIN / CREATININE URINE RATIO: Creatinine,U: 71 mg/dL

## 2010-11-23 LAB — HEPATIC FUNCTION PANEL
Albumin: 3.6 g/dL (ref 3.5–5.2)
Bilirubin, Direct: 0 mg/dL (ref 0.0–0.3)
Total Protein: 6.6 g/dL (ref 6.0–8.3)

## 2010-11-23 LAB — TSH: TSH: 0.97 u[IU]/mL (ref 0.35–5.50)

## 2010-11-23 NOTE — Progress Notes (Signed)
Subjective:    Patient ID: Tammy Hughes, female    DOB: Nov 09, 1932, 77 y.o.   MRN: [id number]  HPI Pt states few mos of persistent moderate pain at the first 3 fingers of the left hand, and assoc numbness.  sxs do not involve the left arm.  Past Medical History  Diagnosis Date  . DIABETES MELLITUS, TYPE I 02/23/2007  . HYPERCHOLESTEROLEMIA 02/01/2008  . ANXIETY 03/08/2008  . PERIPHERAL NEUROPATHY 02/23/2007  . HYPERTENSION 03/08/2008  . GERD 02/23/2007  . DIVERTICULOSIS, COLON 03/08/2008  . RECTAL BLEEDING 03/21/2010  . OSTEOARTHRITIS 02/23/2007  . OSTEOPOROSIS 02/23/2007  . Urolithiasis     Past Surgical History  Procedure Date  . Cholecystectomy 1995  . Esophagogastroduodenoscopy 06/08/2002    History   Social History  . Marital Status: Married    Spouse Name: N/A    Number of Children: N/A  . Years of Education: N/A   Occupational History  . Retired    Social History Main Topics  . Smoking status: Never Smoker   . Smokeless tobacco: Not on file  . Alcohol Use: No  . Drug Use:   . Sexually Active:    Other Topics Concern  . Not on file   Social History Narrative   Sales promotion account executive    Current Outpatient Prescriptions on File Prior to Visit  Medication Sig Dispense Refill  . Calcium Carbonate-Vit D-Min (CALCIUM 600 + MINERALS) 600-200 MG-UNIT TABS Take 1 tablet by mouth daily.        . furosemide (LASIX) 20 MG tablet Take 20 mg by mouth 2 (two) times daily.        Marland Kitchen HYDROcodone-acetaminophen (VICODIN) 5-500 MG per tablet Take 1 tablet by mouth every 4 (four) hours as needed. For pain       . hydrocortisone (ANUSOL-HC) 25 MG suppository Place rectally. As needed for bleeding       . hyoscyamine (LEVBID) 0.375 MG 12 hr tablet Take 0.375 mg by mouth 2 (two) times daily as needed. As needed for abdominal pain       . insulin aspart protamine-insulin aspart (NOVOLOG MIX 70/30) (70-30) 100 UNIT/ML injection Inject 52 Units into the skin 2 (two) times daily with a  meal.       . lisinopril (PRINIVIL,ZESTRIL) 10 MG tablet Take 10 mg by mouth daily.        Marland Kitchen omeprazole (PRILOSEC) 20 MG capsule Take 20 mg by mouth daily.        . simvastatin (ZOCOR) 40 MG tablet Take 40 mg by mouth at bedtime.          No Known Allergies  Family History  Problem Relation Age of Onset  . Cancer Father     Prostate Cancer  . Cancer Sister     Breast Cancer  . Diabetes Sister   . Diabetes Sister     BP 124/66  Pulse 65  Temp(Src) 98.4 F (36.9 C) (Oral)  Ht 5\' 2"  (1.575 m)  Wt 164 lb 6.4 oz (74.571 kg)  BMI 30.07 kg/m2  SpO2 96%    Review of Systems Denies weight change and sob.    Objective:   Physical Exam GENERAL: no distress Neuro: sensation is intact to touch on the left hand, but decreased from the right. The left hand is otherwise normal--no swelling/tend/erythema. Left radial pulse is normal. Pulses: dorsalis pedis intact bilat.   Feet: no deformity.  no ulcer on the feet.  feet are of normal color and temp.  no edema on the left leg, and trace on the right.  There are a few varicosities on the right. Neuro: sensation is intact to touch on the feet.    Lab Results  Component Value Date   HGBA1C 8.0* 11/23/2010   Lab Results  Component Value Date   ALT 38* 11/23/2010   AST 35 11/23/2010   ALKPHOS 74 11/23/2010   BILITOT 0.3 11/23/2010     Assessment & Plan:  Carpal tunnel syndrome, new Dm, needs increased rx elev lft, new, uncertain etiology

## 2010-11-23 NOTE — Patient Instructions (Addendum)
blood tests are being ordered for you today.  please call [phone] to hear your test results.  You will be prompted to enter the 9-digit "MRN" number that appears at the top left of this page, followed by #.  Then you will hear the message. pending the test results, please continue the same medications for now good diet and exercise habits significanly improve the control of your diabetes.  please let me know if you wish to be referred to a dietician.  high blood sugar is very risky to your health.  you should see an eye doctor every year. controlling your blood pressure and cholesterol drastically reduces the damage diabetes does to your body.  this also applies to quitting smoking.  please discuss these with your doctor.  you should take an aspirin every day, unless you have been advised by a doctor not to. check your blood sugar 2 times a day.  vary the time of day when you check, between before the 3 meals, and at bedtime.  also check if you have symptoms of your blood sugar being too high or too low.  please keep a record of the readings and bring it to your next appointment here.  please call us sooner if you are having low blood sugar episodes. Please make a "medicare wellness" appointment in 3 months. (update: i left message on phone-tree:  Increase insulin to 52 units bid.  We'll recheck lft upon return).

## 2010-12-26 ENCOUNTER — Other Ambulatory Visit: Payer: Self-pay | Admitting: Endocrinology

## 2011-02-04 ENCOUNTER — Telehealth: Payer: Self-pay

## 2011-02-04 MED ORDER — ATORVASTATIN CALCIUM 40 MG PO TABS: 40.0000 mg | ORAL_TABLET | Freq: Every day | ORAL | Status: DC

## 2011-02-04 NOTE — Telephone Encounter (Signed)
Pt called stating she has been experiencing leg pain and cramps that she believes is because of Simvastatin. Pt is requesting alternate medication, please advise.

## 2011-02-04 NOTE — Telephone Encounter (Signed)
Change to lipitor.  i sent rx

## 2011-02-04 NOTE — Telephone Encounter (Signed)
Pt informed of Rx and pharmacy 

## 2011-02-07 ENCOUNTER — Other Ambulatory Visit: Payer: Self-pay | Admitting: Endocrinology

## 2011-02-19 ENCOUNTER — Telehealth: Payer: Self-pay

## 2011-02-19 NOTE — Telephone Encounter (Signed)
Tammy Hughes w/ [facility] called to set status of orders faxed on 02/18/11 for back brace and ankle orthotics. She is asking for call to be returned to [phone]

## 2011-02-21 NOTE — Telephone Encounter (Signed)
Tammy Hughes informed that orders were received and that MD will discuss with pt at upcoming appointment on 8/21.

## 2011-03-05 ENCOUNTER — Ambulatory Visit: Payer: Medicare Other | Admitting: Endocrinology

## 2011-03-11 ENCOUNTER — Other Ambulatory Visit (INDEPENDENT_AMBULATORY_CARE_PROVIDER_SITE_OTHER): Payer: Medicare Other

## 2011-03-11 ENCOUNTER — Ambulatory Visit (INDEPENDENT_AMBULATORY_CARE_PROVIDER_SITE_OTHER): Payer: Medicare Other | Admitting: Endocrinology

## 2011-03-11 ENCOUNTER — Encounter: Payer: Self-pay | Admitting: Endocrinology

## 2011-03-11 DIAGNOSIS — E109 Type 1 diabetes mellitus without complications: Secondary | ICD-10-CM

## 2011-03-11 DIAGNOSIS — Z79899 Other long term (current) drug therapy: Secondary | ICD-10-CM

## 2011-03-11 DIAGNOSIS — K769 Liver disease, unspecified: Secondary | ICD-10-CM

## 2011-03-11 DIAGNOSIS — M545 Low back pain: Secondary | ICD-10-CM

## 2011-03-11 DIAGNOSIS — E785 Hyperlipidemia, unspecified: Secondary | ICD-10-CM

## 2011-03-11 LAB — HEPATIC FUNCTION PANEL
AST: 38 U/L — ABNORMAL HIGH (ref 0–37)
Alkaline Phosphatase: 75 U/L (ref 39–117)
Bilirubin, Direct: 0.1 mg/dL (ref 0.0–0.3)
Total Bilirubin: 0.6 mg/dL (ref 0.3–1.2)

## 2011-03-11 LAB — HEMOGLOBIN A1C: Hgb A1c MFr Bld: 8 % — ABNORMAL HIGH (ref 4.6–6.5)

## 2011-03-11 NOTE — Progress Notes (Signed)
Subjective:    Patient ID: Tammy Hughes, female    DOB: Nov 05, 1932, 77 y.o.   MRN: [id number]  HPI no cbg record, but states cbg's are 200-300.  There is no trend throughout the day.   Pt was noted to have elev lft at last ov.  Pt states of 18 mos moderate pain at the lower back, but no assoc numbness of the feet. She says her ankle is better, and she does not want the ankle brace. Past Medical History  Diagnosis Date  . DIABETES MELLITUS, TYPE I 02/23/2007  . HYPERCHOLESTEROLEMIA 02/01/2008  . ANXIETY 03/08/2008  . PERIPHERAL NEUROPATHY 02/23/2007  . HYPERTENSION 03/08/2008  . GERD 02/23/2007  . DIVERTICULOSIS, COLON 03/08/2008  . RECTAL BLEEDING 03/21/2010  . OSTEOARTHRITIS 02/23/2007  . OSTEOPOROSIS 02/23/2007  . Urolithiasis     Past Surgical History  Procedure Date  . Cholecystectomy 1995  . Esophagogastroduodenoscopy 06/08/2002    History   Social History  . Marital Status: Married    Spouse Name: N/A    Number of Children: N/A  . Years of Education: N/A   Occupational History  . Retired    Social History Main Topics  . Smoking status: Never Smoker   . Smokeless tobacco: Not on file  . Alcohol Use: No  . Drug Use:   . Sexually Active:    Other Topics Concern  . Not on file   Social History Narrative   Sales promotion account executive    Current Outpatient Prescriptions on File Prior to Visit  Medication Sig Dispense Refill  . atorvastatin (LIPITOR) 40 MG tablet Take 1 tablet (40 mg total) by mouth daily.  30 tablet  11  . Calcium Carbonate-Vit D-Min (CALCIUM 600 + MINERALS) 600-200 MG-UNIT TABS Take 1 tablet by mouth daily.        . furosemide (LASIX) 20 MG tablet TAKE 1 TABLET BY MOUTH DAILY AS DIRECTED  90 tablet  2  . HYDROcodone-acetaminophen (VICODIN) 5-500 MG per tablet Take 1 tablet by mouth every 4 (four) hours as needed. For pain       . hydrocortisone (ANUSOL-HC) 25 MG suppository Place rectally. As needed for bleeding       . hyoscyamine (LEVBID) 0.375  MG 12 hr tablet Take 0.375 mg by mouth 2 (two) times daily as needed. As needed for abdominal pain       . insulin aspart protamine-insulin aspart (NOVOLOG MIX 70/30) (70-30) 100 UNIT/ML injection Inject 55 Units into the skin 2 (two) times daily with a meal.       . lisinopril (PRINIVIL,ZESTRIL) 10 MG tablet Take 10 mg by mouth daily.        Marland Kitchen omeprazole (PRILOSEC) 20 MG capsule TAKE 1 CAPSULE BY MOUTH DAILY AS        DIRECTED  30 capsule  5    No Known Allergies  Family History  Problem Relation Age of Onset  . Cancer Father     Prostate Cancer  . Cancer Sister     Breast Cancer  . Diabetes Sister   . Diabetes Sister    BP 122/68  Pulse 62  Temp(Src) 98.5 F (36.9 C) (Oral)  Ht 5\' 1"  (1.549 m)  Wt 162 lb 6.4 oz (73.664 kg)  BMI 30.69 kg/m2  SpO2 94%  Review of Systems denies hypoglycemia and urinary retention.      Objective:   Physical Exam VITAL SIGNS:  See vs page GENERAL: no distress Back: nontender Pulses: dorsalis pedis intact bilat.  Feet: no deformity.  no ulcer on the feet.  feet are of normal color and temp.  Trace bilat leg edema Neuro: sensation is intact to touch on the feet  (i reviewed mri of lumbar spine from 2011). Lab Results  Component Value Date   HGBA1C 8.0* 03/11/2011   Lab Results  Component Value Date   ALT 40* 03/11/2011   AST 38* 03/11/2011   ALKPHOS 75 03/11/2011   BILITOT 0.6 03/11/2011       Assessment & Plan:  Dm, needs increased rx Ankle pain, improved Chronic low-back-pain. elev lft, persistent.  Usually due to nash Dyslipidemia.   She can take lipitor despite elev lft

## 2011-03-11 NOTE — Patient Instructions (Addendum)
blood tests are being ordered for you today.  please call [phone] to hear your test results.  You will be prompted to enter the 9-digit "MRN" number that appears at the top left of this page, followed by #.  Then you will hear the message. pending the test results, please increase insulin to 55 units 2x a day. check your blood sugar 2 times a day.  vary the time of day when you check, between before the 3 meals, and at bedtime.  also check if you have symptoms of your blood sugar being too high or too low.  please keep a record of the readings and bring it to your next appointment here.  please call us sooner if you are having low blood sugar episodes. Please make a "medicare wellness" appointment in 3 months. i have completed the form for the back brace, and discarded the form for the ankle brace.   (update: i left message on phone-tree:  Increase insulin as we discussed.  It is safe to take lipitor))

## 2011-03-12 LAB — HEPATITIS B SURFACE ANTIGEN: Hepatitis B Surface Ag: NEGATIVE

## 2011-03-12 LAB — HEPATITIS C ANTIBODY: HCV Ab: NEGATIVE

## 2011-04-03 ENCOUNTER — Other Ambulatory Visit: Payer: Self-pay | Admitting: Endocrinology

## 2011-05-15 ENCOUNTER — Encounter: Payer: Self-pay | Admitting: Endocrinology

## 2011-05-15 ENCOUNTER — Ambulatory Visit (INDEPENDENT_AMBULATORY_CARE_PROVIDER_SITE_OTHER): Payer: Medicare Other | Admitting: Endocrinology

## 2011-05-15 ENCOUNTER — Ambulatory Visit (INDEPENDENT_AMBULATORY_CARE_PROVIDER_SITE_OTHER)
Admission: RE | Admit: 2011-05-15 | Discharge: 2011-05-15 | Disposition: A | Discharge status: Home / Self Care | Payer: Medicare Other | Source: Ambulatory Visit | Attending: Endocrinology | Admitting: Endocrinology

## 2011-05-15 ENCOUNTER — Other Ambulatory Visit (INDEPENDENT_AMBULATORY_CARE_PROVIDER_SITE_OTHER): Payer: Medicare Other

## 2011-05-15 DIAGNOSIS — R05 Cough: Secondary | ICD-10-CM

## 2011-05-15 DIAGNOSIS — I10 Essential (primary) hypertension: Secondary | ICD-10-CM

## 2011-05-15 DIAGNOSIS — E109 Type 1 diabetes mellitus without complications: Secondary | ICD-10-CM

## 2011-05-15 LAB — HEMOGLOBIN A1C: Hgb A1c MFr Bld: 8 % — ABNORMAL HIGH (ref 4.6–6.5)

## 2011-05-15 MED ORDER — FLUTICASONE-SALMETEROL 100-50 MCG/DOSE IN AEPB: 1.0000 | INHALATION_SPRAY | Freq: Two times a day (BID) | RESPIRATORY_TRACT | Status: DC

## 2011-05-15 MED ORDER — AZITHROMYCIN 500 MG PO TABS: 500.0000 mg | ORAL_TABLET | Freq: Every day | ORAL | Status: AC

## 2011-05-15 NOTE — Progress Notes (Signed)
Subjective:    Patient ID: Tammy Hughes, female    DOB: 05-22-1933, 76 y.o.   MRN: [id number]  HPI Pt states 1 week of moderate prod-quality cough in the chest, and assoc nasal congestion.   She was seen at urgent-care in [place] 5 days ago.  She was rx'ed zyrtec and tessalon--no help.   no cbg record, but states cbg was mildly low once only--at hs, after a smaller-then-expected meal.  She says it is highest in the afternoon.   Past Medical History  Diagnosis Date  . DIABETES MELLITUS, TYPE I 02/23/2007  . HYPERCHOLESTEROLEMIA 02/01/2008  . ANXIETY 03/08/2008  . PERIPHERAL NEUROPATHY 02/23/2007  . HYPERTENSION 03/08/2008  . GERD 02/23/2007  . DIVERTICULOSIS, COLON 03/08/2008  . RECTAL BLEEDING 03/21/2010  . OSTEOARTHRITIS 02/23/2007  . OSTEOPOROSIS 02/23/2007  . Urolithiasis     Past Surgical History  Procedure Date  . Cholecystectomy 1995  . Esophagogastroduodenoscopy 06/08/2002    History   Social History  . Marital Status: Married    Spouse Name: N/A    Number of Children: N/A  . Years of Education: N/A   Occupational History  . Retired    Social History Main Topics  . Smoking status: Never Smoker   . Smokeless tobacco: Not on file  . Alcohol Use: No  . Drug Use:   . Sexually Active:    Other Topics Concern  . Not on file   Social History Narrative   Sales promotion account executive    Current Outpatient Prescriptions on File Prior to Visit  Medication Sig Dispense Refill  . atorvastatin (LIPITOR) 40 MG tablet Take 1 tablet (40 mg total) by mouth daily.  30 tablet  11  . Calcium Carbonate-Vit D-Min (CALCIUM 600 + MINERALS) 600-200 MG-UNIT TABS Take 1 tablet by mouth daily.        . furosemide (LASIX) 20 MG tablet TAKE 1 TABLET BY MOUTH DAILY AS DIRECTED  90 tablet  2  . HYDROcodone-acetaminophen (VICODIN) 5-500 MG per tablet Take 1 tablet by mouth every 4 (four) hours as needed. For pain       . hydrocortisone (ANUSOL-HC) 25 MG suppository Place rectally. As needed for  bleeding       . hyoscyamine (LEVBID) 0.375 MG 12 hr tablet Take 0.375 mg by mouth 2 (two) times daily as needed. As needed for abdominal pain       . insulin aspart protamine-insulin aspart (NOVOLOG MIX 70/30) (70-30) 100 UNIT/ML injection Inject into the skin 2 (two) times daily with a meal. 60 unit with breakfast, and 50 with the evening meal      . lisinopril (PRINIVIL,ZESTRIL) 10 MG tablet TAKE 1 TABLET BY MOUTH DAILY  90 tablet  2  . omeprazole (PRILOSEC) 20 MG capsule TAKE 1 CAPSULE BY MOUTH DAILY AS        DIRECTED  30 capsule  5    Allergies  Allergen Reactions  . Aspirin     Family History  Problem Relation Age of Onset  . Cancer Father     Prostate Cancer  . Cancer Sister     Breast Cancer  . Diabetes Sister   . Diabetes Sister     BP 142/68  Pulse 67  Temp(Src) 98.3 F (36.8 C) (Oral)  Ht 5\' 1"  (1.549 m)  Wt 164 lb 12.8 oz (74.753 kg)  BMI 31.14 kg/m2  SpO2 97%   Review of Systems Denies sob and fever    Objective:   Physical Exam VITAL SIGNS:  See vs page GENERAL: no distress head: no deformity eyes: no periorbital swelling, no proptosis external nose and ears are normal mouth: no lesion seen Both eac's and tm's are normal LUNGS:  Clear to auscultation   Lab Results  Component Value Date   HGBA1C 8.0* 05/15/2011      Assessment & Plan:  Acute bronchitis, new Dm, needs increased rx Htn, with ? Of situational component

## 2011-05-15 NOTE — Patient Instructions (Addendum)
i have sent a prescription to your pharmacy, for an antibiotic, and for "advair-100."  take 1 puff 2x a day.  rinse mouth after using. I hope you feel better soon.  If you don't feel better by next week, please call back. Loratadine-d (non-prescription) will help your congestion. Please come back for a "medicare wellness" appointment in 3 months A diabetes blood test, and chest-x-ray, are being requested for you today.  please call [phone] to hear your test results.  You will be prompted to enter the 9-digit "MRN" number that appears at the top left of this page, followed by #.  Then you will hear the message. pending the test results, please change the insulin to 60 units with breakfast, and 50 with the evening meal. (update: i left message on phone-tree:  rx as we discussed)

## 2011-05-23 ENCOUNTER — Encounter: Payer: Self-pay | Admitting: Endocrinology

## 2011-05-23 ENCOUNTER — Ambulatory Visit (INDEPENDENT_AMBULATORY_CARE_PROVIDER_SITE_OTHER): Payer: Medicare Other | Admitting: Endocrinology

## 2011-05-23 DIAGNOSIS — I10 Essential (primary) hypertension: Secondary | ICD-10-CM

## 2011-05-23 DIAGNOSIS — J069 Acute upper respiratory infection, unspecified: Secondary | ICD-10-CM

## 2011-05-23 MED ORDER — PROMETHAZINE-DM 6.25-15 MG/5ML PO SYRP: 5.0000 mL | ORAL_SOLUTION | Freq: Four times a day (QID) | ORAL | Status: AC | PRN

## 2011-05-23 NOTE — Progress Notes (Signed)
Subjective:    Patient ID: Tammy Hughes, female    DOB: Jul 28, 1932, 77 y.o.   MRN: [id number]  HPI Since last ov here 8 days ago, pt says she feels better overall.   However, dry cough persists.   She takes zestril as rx'ed, and had a "tickle" in her throat.   Past Medical History  Diagnosis Date  . DIABETES MELLITUS, TYPE I 02/23/2007  . HYPERCHOLESTEROLEMIA 02/01/2008  . ANXIETY 03/08/2008  . PERIPHERAL NEUROPATHY 02/23/2007  . HYPERTENSION 03/08/2008  . GERD 02/23/2007  . DIVERTICULOSIS, COLON 03/08/2008  . RECTAL BLEEDING 03/21/2010  . OSTEOARTHRITIS 02/23/2007  . OSTEOPOROSIS 02/23/2007  . Urolithiasis     Past Surgical History  Procedure Date  . Cholecystectomy 1995  . Esophagogastroduodenoscopy 06/08/2002    History   Social History  . Marital Status: Married    Spouse Name: N/A    Number of Children: N/A  . Years of Education: N/A   Occupational History  . Retired    Social History Main Topics  . Smoking status: Never Smoker   . Smokeless tobacco: Not on file  . Alcohol Use: No  . Drug Use:   . Sexually Active:    Other Topics Concern  . Not on file   Social History Narrative   Sales promotion account executive    Current Outpatient Prescriptions on File Prior to Visit  Medication Sig Dispense Refill  . atorvastatin (LIPITOR) 40 MG tablet Take 1 tablet (40 mg total) by mouth daily.  30 tablet  11  . Calcium Carbonate-Vit D-Min (CALCIUM 600 + MINERALS) 600-200 MG-UNIT TABS Take 1 tablet by mouth daily.        . Fluticasone-Salmeterol (ADVAIR DISKUS) 100-50 MCG/DOSE AEPB Inhale 1 puff into the lungs 2 (two) times daily.  1 each  1  . furosemide (LASIX) 20 MG tablet TAKE 1 TABLET BY MOUTH DAILY AS DIRECTED  90 tablet  2  . HYDROcodone-acetaminophen (VICODIN) 5-500 MG per tablet Take 1 tablet by mouth every 4 (four) hours as needed. For pain       . hydrocortisone (ANUSOL-HC) 25 MG suppository Place rectally. As needed for bleeding       . hyoscyamine (LEVBID) 0.375  MG 12 hr tablet Take 0.375 mg by mouth 2 (two) times daily as needed. As needed for abdominal pain       . insulin aspart protamine-insulin aspart (NOVOLOG MIX 70/30) (70-30) 100 UNIT/ML injection Inject into the skin 2 (two) times daily with a meal. 60 unit with breakfast, and 50 with the evening meal      . omeprazole (PRILOSEC) 20 MG capsule TAKE 1 CAPSULE BY MOUTH DAILY AS        DIRECTED  30 capsule  5    Allergies  Allergen Reactions  . Aspirin     Family History  Problem Relation Age of Onset  . Cancer Father     Prostate Cancer  . Cancer Sister     Breast Cancer  . Diabetes Sister   . Diabetes Sister     BP 122/62  Pulse 63  Temp(Src) 97.9 F (36.6 C) (Oral)  Ht 5\' 1"  (1.549 m)  Wt 164 lb (74.39 kg)  BMI 30.99 kg/m2  SpO2 93%    Review of Systems Denies fever and sob    Objective:   Physical Exam VITAL SIGNS:  See vs page GENERAL: no distress head: no deformity eyes: no periorbital swelling, no proptosis external nose and ears are normal mouth: no lesion  seen Both eac's and tm's are normal LUNGS:  Clear to auscultation       Assessment & Plan:  G[place] Peers, better cough persists, however.  Allergic rhinitis may contribute Htn, well-controlled, but acei could contribute to cough.

## 2011-05-23 NOTE — Patient Instructions (Addendum)
Change lisinopril to "benicar" 1/2 of 20 mg daily.  Here are some samples.  If you run out prior to your next appointment, please call so i can prescribe you a generic from the same class of medication.   i have sent a prescription to your pharmacy, for a cough medication.  You can take this along with hydrocodone.   Please come back for a "medicare wellness" appointment in January.

## 2011-06-25 ENCOUNTER — Telehealth: Payer: Self-pay | Admitting: *Deleted

## 2011-06-25 MED ORDER — INSULIN ASPART PROT & ASPART (70-30 MIX) 100 UNIT/ML ~~LOC~~ SUSP: SUBCUTANEOUS | Status: DC

## 2011-06-25 NOTE — Telephone Encounter (Signed)
Rx sent to [facility].

## 2011-06-25 NOTE — Telephone Encounter (Signed)
R'cd fax from [facility] that Humalog Stephanie Coup is non-preferred. Insurance will cover Novolog 70/30 and is willing to use vials if necessary-please advise (pt is out of insulin).

## 2011-06-25 NOTE — Telephone Encounter (Signed)
Ok to change--same dosage 

## 2011-07-23 ENCOUNTER — Other Ambulatory Visit: Payer: Self-pay | Admitting: Endocrinology

## 2011-07-23 ENCOUNTER — Ambulatory Visit (INDEPENDENT_AMBULATORY_CARE_PROVIDER_SITE_OTHER): Payer: Medicare Other | Admitting: Endocrinology

## 2011-07-23 ENCOUNTER — Encounter: Payer: Self-pay | Admitting: Endocrinology

## 2011-07-23 ENCOUNTER — Ambulatory Visit (INDEPENDENT_AMBULATORY_CARE_PROVIDER_SITE_OTHER)
Admission: RE | Admit: 2011-07-23 | Discharge: 2011-07-23 | Disposition: A | Discharge status: Home / Self Care | Payer: Medicare Other | Source: Ambulatory Visit | Attending: Endocrinology | Admitting: Endocrinology

## 2011-07-23 ENCOUNTER — Other Ambulatory Visit (INDEPENDENT_AMBULATORY_CARE_PROVIDER_SITE_OTHER): Payer: Medicare Other

## 2011-07-23 DIAGNOSIS — M810 Age-related osteoporosis without current pathological fracture: Secondary | ICD-10-CM

## 2011-07-23 DIAGNOSIS — I10 Essential (primary) hypertension: Secondary | ICD-10-CM

## 2011-07-23 DIAGNOSIS — K769 Liver disease, unspecified: Secondary | ICD-10-CM

## 2011-07-23 DIAGNOSIS — E7800 Pure hypercholesterolemia, unspecified: Secondary | ICD-10-CM

## 2011-07-23 DIAGNOSIS — G609 Hereditary and idiopathic neuropathy, unspecified: Secondary | ICD-10-CM | POA: Diagnosis not present

## 2011-07-23 DIAGNOSIS — M79644 Pain in right finger(s): Secondary | ICD-10-CM

## 2011-07-23 DIAGNOSIS — Z0000 Encounter for general adult medical examination without abnormal findings: Secondary | ICD-10-CM | POA: Diagnosis not present

## 2011-07-23 DIAGNOSIS — D72819 Decreased white blood cell count, unspecified: Secondary | ICD-10-CM

## 2011-07-23 DIAGNOSIS — Z23 Encounter for immunization: Secondary | ICD-10-CM | POA: Diagnosis not present

## 2011-07-23 DIAGNOSIS — M19049 Primary osteoarthritis, unspecified hand: Secondary | ICD-10-CM | POA: Diagnosis not present

## 2011-07-23 DIAGNOSIS — R609 Edema, unspecified: Secondary | ICD-10-CM

## 2011-07-23 DIAGNOSIS — M79609 Pain in unspecified limb: Secondary | ICD-10-CM

## 2011-07-23 DIAGNOSIS — M25549 Pain in joints of unspecified hand: Secondary | ICD-10-CM | POA: Diagnosis not present

## 2011-07-23 DIAGNOSIS — E109 Type 1 diabetes mellitus without complications: Secondary | ICD-10-CM

## 2011-07-23 DIAGNOSIS — Z79899 Other long term (current) drug therapy: Secondary | ICD-10-CM

## 2011-07-23 LAB — MICROALBUMIN / CREATININE URINE RATIO
Creatinine,U: 72 mg/dL
Microalb, Ur: 1.2 mg/dL (ref 0.0–1.9)

## 2011-07-23 LAB — CBC WITH DIFFERENTIAL/PLATELET
Basophils Absolute: 0 10*3/uL (ref 0.0–0.1)
Eosinophils Absolute: 0.1 10*3/uL (ref 0.0–0.7)
MCHC: 34.1 g/dL (ref 30.0–36.0)
MCV: 91.3 fl (ref 78.0–100.0)
Monocytes Absolute: 0.4 10*3/uL (ref 0.1–1.0)
Neutrophils Relative %: 64.2 % (ref 43.0–77.0)
Platelets: 245 10*3/uL (ref 150.0–400.0)
RDW: 13 % (ref 11.5–14.6)
WBC: 5.6 10*3/uL (ref 4.5–10.5)

## 2011-07-23 LAB — TSH: TSH: 1.2 u[IU]/mL (ref 0.35–5.50)

## 2011-07-23 LAB — VITAMIN B12: Vitamin B-12: 365 pg/mL (ref 211–911)

## 2011-07-23 LAB — URINALYSIS, ROUTINE W REFLEX MICROSCOPIC
Specific Gravity, Urine: 1.015 (ref 1.000–1.030)
Urine Glucose: NEGATIVE
Urobilinogen, UA: 0.2 (ref 0.0–1.0)

## 2011-07-23 LAB — HEPATIC FUNCTION PANEL: Albumin: 3.8 g/dL (ref 3.5–5.2)

## 2011-07-23 LAB — LIPID PANEL
Cholesterol: 153 mg/dL (ref 0–200)
Total CHOL/HDL Ratio: 4

## 2011-07-23 LAB — BASIC METABOLIC PANEL
Chloride: 105 mEq/L (ref 96–112)
Potassium: 4 mEq/L (ref 3.5–5.1)

## 2011-07-23 LAB — HEMOGLOBIN A1C: Hgb A1c MFr Bld: 7.9 % — ABNORMAL HIGH (ref 4.6–6.5)

## 2011-07-23 LAB — LDL CHOLESTEROL, DIRECT: Direct LDL: 81.9 mg/dL

## 2011-07-23 NOTE — Progress Notes (Signed)
Subjective:    Patient ID: Tammy Hughes, female    DOB: [birth year], 78 y.o.   MRN: [id number]  HPI Pt says slight dry-quality cough has resumed x a few weeks, but no assoc sob.  She has not recently takes advair, but she takes prilosec.  No wheezing, but she has nasal congestion.   She has few mos of slight pain of the right index finger, and assoc swelling.   Past Medical History  Diagnosis Date  . DIABETES MELLITUS, TYPE I 02/23/2007  . HYPERCHOLESTEROLEMIA 02/01/2008  . ANXIETY 03/08/2008  . PERIPHERAL NEUROPATHY 02/23/2007  . HYPERTENSION 03/08/2008  . GERD 02/23/2007  . DIVERTICULOSIS, COLON 03/08/2008  . RECTAL BLEEDING 03/21/2010  . OSTEOARTHRITIS 02/23/2007  . OSTEOPOROSIS 02/23/2007  . Urolithiasis     Past Surgical History  Procedure Date  . Cholecystectomy 1995  . Esophagogastroduodenoscopy 06/08/2002    History   Social History  . Marital Status: Married    Spouse Name: N/A    Number of Children: N/A  . Years of Education: N/A   Occupational History  . Retired    Social History Main Topics  . Smoking status: Never Smoker   . Smokeless tobacco: Not on file  . Alcohol Use: No  . Drug Use:   . Sexually Active:    Other Topics Concern  . Not on file   Social History Narrative   Sales promotion account executive    Current Outpatient Prescriptions on File Prior to Visit  Medication Sig Dispense Refill  . atorvastatin (LIPITOR) 40 MG tablet Take 1 tablet (40 mg total) by mouth daily.  30 tablet  11  . Calcium Carbonate-Vit D-Min (CALCIUM 600 + MINERALS) 600-200 MG-UNIT TABS Take 1 tablet by mouth daily.        . Fluticasone-Salmeterol (ADVAIR DISKUS) 100-50 MCG/DOSE AEPB Inhale 1 puff into the lungs 2 (two) times daily.  1 each  1  . furosemide (LASIX) 20 MG tablet TAKE 1 TABLET BY MOUTH DAILY AS DIRECTED  90 tablet  2  . HYDROcodone-acetaminophen (VICODIN) 5-500 MG per tablet Take 1 tablet by mouth every 4 (four) hours as needed. For pain       . hydrocortisone  (ANUSOL-HC) 25 MG suppository Place rectally. As needed for bleeding       . hyoscyamine (LEVBID) 0.375 MG 12 hr tablet Take 0.375 mg by mouth 2 (two) times daily as needed. As needed for abdominal pain       . insulin aspart protamine-insulin aspart (NOVOLOG MIX 70/30) (70-30) 100 UNIT/ML injection Inject into the skin 2 (two) times daily with a meal. 60 unit with breakfast, and 50 with the evening meal  45 mL  5  . olmesartan (BENICAR) 20 MG tablet 1/2 tab daily       . omeprazole (PRILOSEC) 20 MG capsule TAKE 1 CAPSULE BY MOUTH DAILY AS        DIRECTED  30 capsule  5    Allergies  Allergen Reactions  . Aspirin     Family History  Problem Relation Age of Onset  . Cancer Father     Prostate Cancer  . Cancer Sister     Breast Cancer  . Diabetes Sister   . Diabetes Sister    BP 120/70  Pulse 66  Temp(Src) 97.7 F (36.5 C) (Oral)  Ht 5\' 1"  (1.549 m)  Wt 164 lb 4 oz (74.503 kg)  BMI 31.03 kg/m2  SpO2 96%  Review of Systems Denies fever and abd pain.  Objective:   Physical Exam VITAL SIGNS:  See vs page GENERAL: no distress LUNGS:  Clear to auscultation Right index finger: normal excpt for slight swelling   Lab Results  Component Value Date   WBC 5.6 07/23/2011   HGB 13.6 07/23/2011   HCT 40.0 07/23/2011   PLT 245.0 07/23/2011   GLUCOSE 200* 07/23/2011   CHOL 153 07/23/2011   TRIG 239.0* 07/23/2011   HDL 43.00 07/23/2011   LDLDIRECT 81.9 07/23/2011   LDLCALC 62 02/15/2009   ALT 36* 07/23/2011   AST 28 07/23/2011   NA 141 07/23/2011   K 4.0 07/23/2011   CL 105 07/23/2011   CREATININE 1.0 07/23/2011   BUN 16 07/23/2011   CO2 29 07/23/2011   TSH 1.20 07/23/2011   INR 1.0 ratio 03/21/2010   HGBA1C 7.9* 07/23/2011   MICROALBUR 1.2 07/23/2011  (i reviewed ua result) (x-ray: OA)    Assessment & Plan:  OA, no rx is needed NASH, persistent DM, this is the best control this pt should aim for, given this regimen, which does match insulin to her changing needs throughout the day. Abnormal UA,  uncertain etiology   Subjective:   Patient here for Medicare annual wellness visit and management of other chronic and acute problems.     Risk factors: advanced age    Roster of Physicians Providing Medical Care to Patient: Opthal: [place] ([place])   Activities of Daily Living: In your present state of health, do you have any difficulty performing the following activities?:  Preparing food and eating?: No  Bathing yourself: No  Getting dressed: No  Using the toilet: No  Moving around from place to place: No  In the past year have you fallen or had a near fall?: No    Home Safety: Has smoke detector and wears seat belts. No firearms.   Diet and Exercise  Current exercise habits: pt ays not so good Dietary issues discussed: pt reports a healthy diet   Depression Screen  Q1: Over the past two weeks, have you felt down, depressed or hopeless? no  Q2: Over the past two weeks, have you felt little interest or pleasure in doing things? no   The following portions of the patient's history were reviewed and updated as appropriate: allergies, current medications, past family history, past medical history, past social history, past surgical history and problem list.  Past Medical History  Diagnosis Date  . DIABETES MELLITUS, TYPE I 02/23/2007  . HYPERCHOLESTEROLEMIA 02/01/2008  . ANXIETY 03/08/2008  . PERIPHERAL NEUROPATHY 02/23/2007  . HYPERTENSION 03/08/2008  . GERD 02/23/2007  . DIVERTICULOSIS, COLON 03/08/2008  . RECTAL BLEEDING 03/21/2010  . OSTEOARTHRITIS 02/23/2007  . OSTEOPOROSIS 02/23/2007  . Urolithiasis     Past Surgical History  Procedure Date  . Cholecystectomy 1995  . Esophagogastroduodenoscopy 06/08/2002    History   Social History  . Marital Status: Married    Spouse Name: N/A    Number of Children: N/A  . Years of Education: N/A   Occupational History  . Retired    Social History Main Topics  . Smoking status: Never Smoker   . Smokeless tobacco: Not  on file  . Alcohol Use: No  . Drug Use:   . Sexually Active:    Other Topics Concern  . Not on file   Social History Narrative   Sales promotion account executive    Current Outpatient Prescriptions on File Prior to Visit  Medication Sig Dispense Refill  . atorvastatin (LIPITOR) 40 MG tablet Take  1 tablet (40 mg total) by mouth daily.  30 tablet  11  . Calcium Carbonate-Vit D-Min (CALCIUM 600 + MINERALS) 600-200 MG-UNIT TABS Take 1 tablet by mouth daily.        . Fluticasone-Salmeterol (ADVAIR DISKUS) 100-50 MCG/DOSE AEPB Inhale 1 puff into the lungs 2 (two) times daily.  1 each  1  . furosemide (LASIX) 20 MG tablet TAKE 1 TABLET BY MOUTH DAILY AS DIRECTED  90 tablet  2  . HYDROcodone-acetaminophen (VICODIN) 5-500 MG per tablet Take 1 tablet by mouth every 4 (four) hours as needed. For pain       . hydrocortisone (ANUSOL-HC) 25 MG suppository Place rectally. As needed for bleeding       . hyoscyamine (LEVBID) 0.375 MG 12 hr tablet Take 0.375 mg by mouth 2 (two) times daily as needed. As needed for abdominal pain       . insulin aspart protamine-insulin aspart (NOVOLOG MIX 70/30) (70-30) 100 UNIT/ML injection Inject into the skin 2 (two) times daily with a meal. 60 unit with breakfast, and 50 with the evening meal  45 mL  5  . olmesartan (BENICAR) 20 MG tablet 1/2 tab daily       . omeprazole (PRILOSEC) 20 MG capsule TAKE 1 CAPSULE BY MOUTH DAILY AS        DIRECTED  30 capsule  5    Allergies  Allergen Reactions  . Aspirin     Family History  Problem Relation Age of Onset  . Cancer Father     Prostate Cancer  . Cancer Sister     Breast Cancer  . Diabetes Sister   . Diabetes Sister     BP 120/70  Pulse 66  Temp(Src) 97.7 F (36.5 C) (Oral)  Ht 5\' 1"  (1.549 m)  Wt 164 lb 4 oz (74.503 kg)  BMI 31.03 kg/m2  SpO2 96%   Review of Systems  Denies hearing loss, and visual loss Objective:   Vision:  Sees opthalmologist Hearing: grossly normal Body mass index:  See vs page Msk:  pt easily and quickly performs "get-up-and-go" from a sitting position Cognitive Impairment Assessment: cognition, memory and judgment appear normal.  remembers 3/3 at 5 minutes.  excellent recall.  can easily read and write a sentence.  alert and oriented x 3   Assessment:   Medicare wellness utd on preventive parameters    Plan:   During the course of the visit the patient was educated and counseled about appropriate screening and preventive services including:        Fall prevention   Screening mammography  Bone densitometry screening  Diabetes screening  Nutrition counseling   Vaccines / LABS Zostavax / Pnemonccoal Vaccine  today   Patient Instructions (the written plan) was given to the patient.

## 2011-07-23 NOTE — Patient Instructions (Addendum)
good diet and exercise habits significanly improve the control of your diabetes.  please let me know if you wish to be referred to a dietician.  high blood sugar is very risky to your health.  you should see an eye doctor every year. controlling your blood pressure and cholesterol drastically reduces the damage diabetes does to your body.  this also applies to quitting smoking.  please discuss these with your doctor.  you should take an aspirin every day, unless you have been advised by a doctor not to. check your blood sugar 2 times a day.  vary the time of day when you check, between before the 3 meals, and at bedtime.  also check if you have symptoms of your blood sugar being too high or too low.  please keep a record of the readings and bring it to your next appointment here.  please call us sooner if your blood sugar goes below 70, or if it stays over 200. Loratadine-d (non-prescription) will help your cough.   blood tests, and an x-ray, are being requested for you today.  please call [phone] to hear your test results.  You will be prompted to enter the 9-digit "MRN" number that appears at the top left of this page, followed by #.  Then you will hear the message. Please come back for a follow-up appointment in 3 months, with bone-density test the same day.   You should have a vaccine against shingles (a painful rash which results from the  chickenpox infection which most people had many years ago).  This vaccine reduces, but does not totally eliminate the risk of shingles.  Because this is a medicare part d benefit, you can go to a pharmacy and get the injection. (update: i left message on phone-tree:  Call if urinary sxs.  We'll follow LFT).

## 2011-07-24 LAB — PTH, INTACT AND CALCIUM
Calcium, Total (PTH): 9.9 mg/dL (ref 8.4–10.5)
PTH: 31.3 pg/mL (ref 14.0–72.0)

## 2011-07-31 ENCOUNTER — Encounter: Payer: Self-pay | Admitting: Endocrinology

## 2011-07-31 ENCOUNTER — Ambulatory Visit (INDEPENDENT_AMBULATORY_CARE_PROVIDER_SITE_OTHER)
Admission: RE | Admit: 2011-07-31 | Discharge: 2011-07-31 | Disposition: A | Discharge status: Home / Self Care | Payer: Medicare Other | Source: Ambulatory Visit | Attending: Endocrinology | Admitting: Endocrinology

## 2011-07-31 ENCOUNTER — Ambulatory Visit (INDEPENDENT_AMBULATORY_CARE_PROVIDER_SITE_OTHER): Payer: Medicare Other | Admitting: Endocrinology

## 2011-07-31 VITALS — BP 142/72 | HR 65 | Temp 97.3°F | Ht 61.0 in | Wt 164.0 lb

## 2011-07-31 DIAGNOSIS — I10 Essential (primary) hypertension: Secondary | ICD-10-CM | POA: Diagnosis not present

## 2011-07-31 DIAGNOSIS — R05 Cough: Secondary | ICD-10-CM

## 2011-07-31 DIAGNOSIS — I77810 Thoracic aortic ectasia: Secondary | ICD-10-CM | POA: Diagnosis not present

## 2011-07-31 MED ORDER — CEFUROXIME AXETIL 250 MG PO TABS: 250.0000 mg | ORAL_TABLET | Freq: Two times a day (BID) | ORAL | Status: DC

## 2011-07-31 MED ORDER — PROMETHAZINE-CODEINE 6.25-10 MG/5ML PO SYRP: 5.0000 mL | ORAL_SOLUTION | ORAL | Status: AC | PRN

## 2011-07-31 NOTE — Progress Notes (Signed)
Subjective:    Patient ID: Tammy Hughes, female    DOB: Jun 11, 1933, 78 y.o.   MRN: [id number]  HPI Pt states few mos of slightly prod cough in the chest, and assoc pain.  She has little if any assoc wheezing.   Past Medical History  Diagnosis Date  . DIABETES MELLITUS, TYPE I 02/23/2007  . HYPERCHOLESTEROLEMIA 02/01/2008  . ANXIETY 03/08/2008  . PERIPHERAL NEUROPATHY 02/23/2007  . HYPERTENSION 03/08/2008  . GERD 02/23/2007  . DIVERTICULOSIS, COLON 03/08/2008  . RECTAL BLEEDING 03/21/2010  . OSTEOARTHRITIS 02/23/2007  . OSTEOPOROSIS 02/23/2007  . Urolithiasis     Past Surgical History  Procedure Date  . Cholecystectomy 1995  . Esophagogastroduodenoscopy 06/08/2002    History   Social History  . Marital Status: Married    Spouse Name: N/A    Number of Children: N/A  . Years of Education: N/A   Occupational History  . Retired    Social History Main Topics  . Smoking status: Never Smoker   . Smokeless tobacco: Not on file  . Alcohol Use: No  . Drug Use:   . Sexually Active:    Other Topics Concern  . Not on file   Social History Narrative   Sales promotion account executive    Current Outpatient Prescriptions on File Prior to Visit  Medication Sig Dispense Refill  . atorvastatin (LIPITOR) 40 MG tablet Take 1 tablet (40 mg total) by mouth daily.  30 tablet  11  . Calcium Carbonate-Vit D-Min (CALCIUM 600 + MINERALS) 600-200 MG-UNIT TABS Take 1 tablet by mouth daily.        . Fluticasone-Salmeterol (ADVAIR DISKUS) 100-50 MCG/DOSE AEPB Inhale 1 puff into the lungs 2 (two) times daily.  1 each  1  . furosemide (LASIX) 20 MG tablet TAKE 1 TABLET BY MOUTH DAILY AS DIRECTED  90 tablet  2  . HYDROcodone-acetaminophen (VICODIN) 5-500 MG per tablet Take 1 tablet by mouth every 4 (four) hours as needed. For pain       . hydrocortisone (ANUSOL-HC) 25 MG suppository Place rectally. As needed for bleeding       . hyoscyamine (LEVBID) 0.375 MG 12 hr tablet Take 0.375 mg by mouth 2 (two) times  daily as needed. As needed for abdominal pain       . insulin aspart protamine-insulin aspart (NOVOLOG MIX 70/30) (70-30) 100 UNIT/ML injection Inject into the skin 2 (two) times daily with a meal. 60 unit with breakfast, and 50 with the evening meal  45 mL  5  . olmesartan (BENICAR) 20 MG tablet 1/2 tab daily       . omeprazole (PRILOSEC) 20 MG capsule TAKE 1 CAPSULE BY MOUTH DAILY AS        DIRECTED  30 capsule  5    Allergies  Allergen Reactions  . Aspirin     Family History  Problem Relation Age of Onset  . Cancer Father     Prostate Cancer  . Cancer Sister     Breast Cancer  . Diabetes Sister   . Diabetes Sister     BP 142/72  Pulse 65  Temp(Src) 97.3 F (36.3 C) (Oral)  Ht 5\' 1"  (1.549 m)  Wt 164 lb (74.39 kg)  BMI 30.99 kg/m2  SpO2 96%   Review of Systems Denies fever and sob.     Objective:   Physical Exam VITAL SIGNS:  See vs page GENERAL: no distress Chest wall: nontender.   LUNGS:  Clear to auscultation, except for a  few rales at the left base.     CXR: NAD    Assessment & Plan:  Cough, uncertain etiology HTN, with ? Of situational component.

## 2011-07-31 NOTE — Patient Instructions (Addendum)
A chest-x-ray is being requested for you today.  please call [phone] to hear your test results.  You will be prompted to enter the 9-digit "MRN" number that appears at the top left of this page, followed by #.  Then you will hear the message. We'll recheck your blood pressure the next time you are here.   i have sent a prescription to your pharmacy, for an antibiotic. Here is a prescription for cough syrup.  Do not take this with hydrocodone pills.  Refer to a lung specialist.  you will receive a phone call, about a day and time for an appointment (update: i left message on phone-tree:  rx as we discussed)

## 2011-08-06 ENCOUNTER — Other Ambulatory Visit: Payer: Self-pay | Admitting: Endocrinology

## 2011-08-07 DIAGNOSIS — Z961 Presence of intraocular lens: Secondary | ICD-10-CM | POA: Diagnosis not present

## 2011-08-07 DIAGNOSIS — E109 Type 1 diabetes mellitus without complications: Secondary | ICD-10-CM | POA: Diagnosis not present

## 2011-08-08 ENCOUNTER — Encounter: Payer: Self-pay | Admitting: Internal Medicine

## 2011-08-08 ENCOUNTER — Ambulatory Visit (INDEPENDENT_AMBULATORY_CARE_PROVIDER_SITE_OTHER): Payer: Medicare Other | Admitting: Internal Medicine

## 2011-08-08 ENCOUNTER — Other Ambulatory Visit: Payer: Medicare Other

## 2011-08-08 VITALS — BP 120/78 | HR 65 | Ht 61.0 in | Wt 166.0 lb

## 2011-08-08 DIAGNOSIS — R05 Cough: Secondary | ICD-10-CM | POA: Diagnosis not present

## 2011-08-08 DIAGNOSIS — J209 Acute bronchitis, unspecified: Secondary | ICD-10-CM

## 2011-08-08 MED ORDER — BENZONATATE 200 MG PO CAPS: 200.0000 mg | ORAL_CAPSULE | Freq: Three times a day (TID) | ORAL | Status: DC | PRN

## 2011-08-08 NOTE — Patient Instructions (Signed)
Order- lab Allergy Profile    Dx acute bronchitis  Sample Dulera 100   2 puffs then rinse mouth well, twice every day  Script sent for benzonatate for cough

## 2011-08-08 NOTE — Progress Notes (Signed)
08/08/11- 22 yoF never smoker referred by Dr Everardo All because of cough. She complains now of cough over the past 2-3 weeks but says cough was worse a month ago when she moved to this home,  which is carpeted. She has no prior history of respiratory problems or cough except for mild seasonal allergy with nasal congestion. Her recent acute exacerbation would be consistent with a superimposed viral bronchitis typical of what is in town. She ended antibiotic therapy yesterday. At its worst, she was wheezing a little but had little sputum. She denies current fever, significant sputum, chest pain. Admits acid heartburn, sore throats, sneeze and itch, occasional earache. No history of ENT surgery or tuberculosis. She used a sample of Advair for 1 week, saw little effect, and did not refill it.  CXR 08/01/2011 indicated mild bronchitis. I reviewed the images. Complicated medical problems were reviewed, including diabetes, hypertension, GERD. She lives with husband and 2 children. One son has asthma. She has a total of 5 children, 4 adopted. Homemaker.  ROS-see HPI Constitutional:   No-   weight loss, night sweats, fevers, chills, fatigue, lassitude. HEENT:   No-  headaches, difficulty swallowing, tooth/dental problems, sore throat,       No-  sneezing, itching, ear ache, nasal congestion, post nasal drip,  CV:  No-   chest pain, orthopnea, PND, swelling in lower extremities, anasarca, dizziness, palpitations Resp: No-   shortness of breath with exertion or at rest.              No-   productive cough,  No non-productive cough,  No- coughing up of blood.                change in color of mucus- initially yellow, now clearing.  No- wheezing.   Skin: No-   rash or lesions. GI:  No-   heartburn, indigestion, abdominal pain, nausea, vomiting, diarrhea,                 change in bowel habits, loss of appetite GU:  MS:  No-   joint pain or swelling.  No- decreased range of motion.  No- back pain. Neuro-      nothing unusual Psych:  No- change in mood or affect. No depression or anxiety.  No memory loss.  OBJ General- Alert, Oriented, Affect-appropriate, Distress- none acute Skin- rash-none, lesions- none, excoriation- none Lymphadenopathy- none Head- atraumatic            Eyes- Gross vision intact, PERRLA, conjunctivae clear secretions            Ears- Hearing, canals-normal            Nose- Clear, no-Septal dev, mucus, polyps, erosion, perforation             Throat- Mallampati III , mucosa clear , drainage- none, tonsils- atrophic Neck- flexible , trachea midline, no stridor , thyroid nl, carotid no bruit Chest - symmetrical excursion , unlabored           Heart/CV- RRR , no murmur , no gallop  , no rub, nl s1 s2                           - JVD- none , +edema- trace, stasis changes- none, varices- none           Lung- clear to P&A, wheeze- none, +raspy cough , dullness-none, rub- none  Chest wall-  Abd- tender-no, distended-no, bowel sounds-present, HSM- no Br/ Gen/ Rectal- Not done, not indicated Extrem- cyanosis- none, clubbing, none, atrophy- none, strength- nl Neuro- grossly intact to observation

## 2011-08-11 NOTE — Assessment & Plan Note (Addendum)
Describing 2 patterns of cough- a more chronic dry cough which began around time of her move to the current home, and an acute productive cough consistent with an acute viral bronchitis.  Plan- Allergy profile to explore IgE involvement, sample Dulera 200 for maximal inhaled steroid therapy., benzonatate.

## 2011-08-12 LAB — ALLERGY FULL PROFILE
Allergen,Goose feathers, e70: <0.1 kU/L (ref <0.35)
Alternaria Alternata: <0.1 kU/L (ref <0.35)
Bahia Grass: <0.1 kU/L (ref <0.35)
Bermuda Grass: <0.1 kU/L (ref <0.35)
Cat Dander: <0.1 kU/L (ref <0.35)
Curvularia lunata: <0.1 kU/L (ref <0.35)
Dog Dander: <0.1 kU/L (ref <0.35)
Fescue: <0.1 kU/L (ref <0.35)
Goldenrod: <0.1 kU/L (ref <0.35)
Helminthosporium halodes: <0.1 kU/L (ref <0.35)
Lamb's Quarters: <0.1 kU/L (ref <0.35)
Sycamore Tree: <0.1 kU/L (ref <0.35)

## 2011-08-12 NOTE — Progress Notes (Signed)
Allergy profile- negative for elevated allergy antibodies at this time.

## 2011-09-11 ENCOUNTER — Encounter: Payer: Self-pay | Admitting: Internal Medicine

## 2011-09-11 ENCOUNTER — Ambulatory Visit (INDEPENDENT_AMBULATORY_CARE_PROVIDER_SITE_OTHER): Payer: Medicare Other | Admitting: Internal Medicine

## 2011-09-11 VITALS — BP 120/72 | HR 73 | Ht 61.0 in | Wt 166.6 lb

## 2011-09-11 DIAGNOSIS — J310 Chronic rhinitis: Secondary | ICD-10-CM

## 2011-09-11 DIAGNOSIS — R059 Cough, unspecified: Secondary | ICD-10-CM

## 2011-09-11 DIAGNOSIS — R05 Cough: Secondary | ICD-10-CM | POA: Diagnosis not present

## 2011-09-11 MED ORDER — MOMETASONE FURO-FORMOTEROL FUM 100-5 MCG/ACT IN AERO: 2.0000 | INHALATION_SPRAY | Freq: Two times a day (BID) | RESPIRATORY_TRACT | Status: DC

## 2011-09-11 MED ORDER — BENZONATATE 200 MG PO CAPS: 200.0000 mg | ORAL_CAPSULE | Freq: Three times a day (TID) | ORAL | Status: AC | PRN

## 2011-09-11 NOTE — Progress Notes (Signed)
08/08/11- 58 yoF never smoker referred by Dr Everardo All because of cough. She complains now of cough over the past 2-3 weeks but says cough was worse a month ago when she moved to this home,  which is carpeted. She has no prior history of respiratory problems or cough except for mild seasonal allergy with nasal congestion. Her recent acute exacerbation would be consistent with a superimposed viral bronchitis typical of what is in town. She ended antibiotic therapy yesterday. At its worst, she was wheezing a little but had little sputum. She denies current fever, significant sputum, chest pain. Admits acid heartburn, sore throats, sneeze and itch, occasional earache. No history of ENT surgery or tuberculosis. She used a sample of Advair for 1 week, saw little effect, and did not refill it.  CXR 08/01/2011 indicated mild bronchitis. I reviewed the images. Complicated medical problems were reviewed, including diabetes, hypertension, GERD. She lives with husband and 2 children. One son has asthma. She has a total of 5 children, 4 adopted. Homemaker.  09/11/11- 3 yo F never smoker followed for cough, allergic rhinitis, complicated by DM, allergic rhinitis, GERD Dulera sample was some help. She now has a cold with clear mucus, no fever. Her chronic dry cough wakes her at night. Allergy profile 08/12/2011-total IgE less than 1.5 with no specific elevations. I explained that this makes a significant inhalant allergy very unlikely.  ROS-see HPI Constitutional:   No-   weight loss, night sweats, fevers, chills, fatigue, lassitude. HEENT:   No-  headaches, difficulty swallowing, tooth/dental problems, sore throat,       No-  sneezing, itching, ear ache, +nasal congestion, post nasal drip,  CV:  No-   chest pain, orthopnea, PND, swelling in lower extremities, anasarca, dizziness, palpitations Resp: No-   shortness of breath with exertion or at rest.              No-   productive cough, +non-productive cough,  No-  coughing up of blood.                No-change in color of mucus.  No- wheezing.   Skin: No-   rash or lesions. GI:  No-   heartburn, indigestion, abdominal pain, nausea, vomiting, diarrhea,                 change in bowel habits, loss of appetite GU:  MS:  No-   joint pain or swelling.  No- decreased range of motion.  No- back pain. Neuro-     nothing unusual Psych:  No- change in mood or affect. No depression or anxiety.  No memory loss.  OBJ General- Alert, Oriented, Affect-appropriate, Distress- none acute Skin- rash-none, lesions- none, excoriation- none Lymphadenopathy- none Head- atraumatic            Eyes- Gross vision intact, PERRLA, conjunctivae clear secretions            Ears- Hearing, canals-normal            Nose- Clear, no-Septal dev, mucus, polyps, erosion, perforation             Throat- Mallampati IV , mucosa clear , drainage- none, tonsils- atrophic Neck- flexible , trachea midline, no stridor , thyroid nl, carotid no bruit Chest - symmetrical excursion , unlabored           Heart/CV- RRR , no murmur , no gallop  , no rub, nl s1 s2                           -  JVD- none , +edema- trace, stasis changes- none, varices- none           Lung- clear to P&A, wheeze- none, No- cough , dullness-none, rub- none           Chest wall-  Abd- Br/ Gen/ Rectal- Not done, not indicated Extrem- cyanosis- none, clubbing, none, atrophy- none, strength- nl Neuro- grossly intact to observation

## 2011-09-11 NOTE — Patient Instructions (Signed)
Refill scripts sent to stay on dulera 100, 2 puffs and rinse mouth twice every day                                               Benzonatate   1 , 3 times daily as needed for cough  For the cold now- fluids, and any otc cold remedy you find helpful.

## 2011-09-15 NOTE — Assessment & Plan Note (Signed)
Chronic cough is probably cyclical with component of lower esophageal reflux. There is enough bronchitis to respond to [facility]. We'll add benzonatate.

## 2011-09-15 NOTE — Assessment & Plan Note (Signed)
Viral pattern upper respiratory infection now to be treated symptomatically. She continues symptomatic therapy, saline lavage.

## 2011-10-14 ENCOUNTER — Ambulatory Visit: Payer: Medicare Other | Admitting: Endocrinology

## 2011-10-14 ENCOUNTER — Inpatient Hospital Stay: Admission: RE | Admit: 2011-10-14 | Payer: Medicare Other | Source: Ambulatory Visit

## 2011-10-21 ENCOUNTER — Encounter: Payer: Self-pay | Admitting: Endocrinology

## 2011-10-21 ENCOUNTER — Ambulatory Visit (INDEPENDENT_AMBULATORY_CARE_PROVIDER_SITE_OTHER)
Admission: RE | Admit: 2011-10-21 | Discharge: 2011-10-21 | Disposition: A | Discharge status: Home / Self Care | Payer: Medicare Other | Source: Ambulatory Visit | Attending: Endocrinology | Admitting: Endocrinology

## 2011-10-21 ENCOUNTER — Ambulatory Visit (INDEPENDENT_AMBULATORY_CARE_PROVIDER_SITE_OTHER): Payer: Medicare Other | Admitting: Endocrinology

## 2011-10-21 ENCOUNTER — Other Ambulatory Visit (INDEPENDENT_AMBULATORY_CARE_PROVIDER_SITE_OTHER): Payer: Medicare Other

## 2011-10-21 VITALS — BP 112/68 | HR 62 | Temp 97.5°F | Ht 61.0 in | Wt 160.2 lb

## 2011-10-21 DIAGNOSIS — M25569 Pain in unspecified knee: Secondary | ICD-10-CM

## 2011-10-21 DIAGNOSIS — M25562 Pain in left knee: Secondary | ICD-10-CM

## 2011-10-21 DIAGNOSIS — E109 Type 1 diabetes mellitus without complications: Secondary | ICD-10-CM

## 2011-10-21 DIAGNOSIS — K769 Liver disease, unspecified: Secondary | ICD-10-CM

## 2011-10-21 DIAGNOSIS — M25561 Pain in right knee: Secondary | ICD-10-CM

## 2011-10-21 DIAGNOSIS — K7689 Other specified diseases of liver: Secondary | ICD-10-CM | POA: Diagnosis not present

## 2011-10-21 DIAGNOSIS — K7581 Nonalcoholic steatohepatitis (NASH): Secondary | ICD-10-CM | POA: Insufficient documentation

## 2011-10-21 LAB — HEPATIC FUNCTION PANEL
ALT: 40 U/L — ABNORMAL HIGH (ref 0–35)
Total Bilirubin: 0.4 mg/dL (ref 0.3–1.2)
Total Protein: 7.4 g/dL (ref 6.0–8.3)

## 2011-10-21 NOTE — Progress Notes (Signed)
Subjective:    Patient ID: Tammy Hughes, female    DOB: 1933/07/13, 78 y.o.   MRN: [id number]  HPI Pt returns for f/u of insulin-requiring DM (1990).  pt states she feels well in general.  she brings a record of her cbg's which i have reviewed today.  It varies from 116-300.  There is no trend throughout the day. Pt states few mos of moderate pain at both knees (R > L).  No injury.  No assoc numbness. Past Medical History  Diagnosis Date  . DIABETES MELLITUS, TYPE I 02/23/2007  . HYPERCHOLESTEROLEMIA 02/01/2008  . ANXIETY 03/08/2008  . PERIPHERAL NEUROPATHY 02/23/2007  . HYPERTENSION 03/08/2008  . GERD 02/23/2007  . DIVERTICULOSIS, COLON 03/08/2008  . RECTAL BLEEDING 03/21/2010  . OSTEOARTHRITIS 02/23/2007  . OSTEOPOROSIS 02/23/2007  . Urolithiasis     Past Surgical History  Procedure Date  . Cholecystectomy 1995  . Esophagogastroduodenoscopy 06/08/2002    History   Social History  . Marital Status: Married    Spouse Name: N/A    Number of Children: N/A  . Years of Education: N/A   Occupational History  . Retired    Social History Main Topics  . Smoking status: Never Smoker   . Smokeless tobacco: Not on file  . Alcohol Use: No  . Drug Use:   . Sexually Active:    Other Topics Concern  . Not on file   Social History Narrative   Sales promotion account executive    Current Outpatient Prescriptions on File Prior to Visit  Medication Sig Dispense Refill  . atorvastatin (LIPITOR) 40 MG tablet Take 1 tablet (40 mg total) by mouth daily.  30 tablet  11  . Calcium Carbonate-Vit D-Min (CALCIUM 600 + MINERALS) 600-200 MG-UNIT TABS Take 1 tablet by mouth daily.        . furosemide (LASIX) 20 MG tablet TAKE 1 TABLET BY MOUTH DAILY AS DIRECTED  90 tablet  2  . HYDROcodone-acetaminophen (VICODIN) 5-500 MG per tablet Take 1 tablet by mouth every 4 (four) hours as needed. For pain       . hydrocortisone (ANUSOL-HC) 25 MG suppository Place rectally. As needed for bleeding       .  hyoscyamine (LEVBID) 0.375 MG 12 hr tablet Take 0.375 mg by mouth 2 (two) times daily as needed. As needed for abdominal pain       . insulin aspart protamine-insulin aspart (NOVOLOG MIX 70/30) (70-30) 100 UNIT/ML injection Inject into the skin 2 (two) times daily with a meal. 60 unit with breakfast, and 50 with the evening meal  45 mL  5  . mometasone-formoterol (DULERA) 100-5 MCG/ACT AERO Inhale 2 puffs into the lungs 2 (two) times daily.  1 Inhaler  prn  . olmesartan (BENICAR) 20 MG tablet 1/2 tab daily       . omeprazole (PRILOSEC) 20 MG capsule TAKE 1 CAPSULE BY MOUTH DAILY AS        DIRECTED  30 capsule  2    Allergies  Allergen Reactions  . Aspirin     Family History  Problem Relation Age of Onset  . Cancer Father     Prostate Cancer  . Cancer Sister     Breast Cancer  . Diabetes Sister   . Diabetes Sister     BP 112/68  Pulse 62  Temp(Src) 97.5 F (36.4 C) (Oral)  Ht 5\' 1"  (1.549 m)  Wt 160 lb 3.2 oz (72.666 kg)  BMI 30.27 kg/m2  SpO2 94%  Review of Systems denies hypoglycemia and weight change.      Objective:   Physical Exam VITAL SIGNS:  See vs page GENERAL: no distress Pulses: dorsalis pedis intact bilat.   Feet: no deformity.  no ulcer on the feet.  feet are of normal color and temp.  Trace bilat leg edema Neuro: sensation is intact to touch on the feet Knees: nontender  (i reviewed x-ray results)    Assessment & Plan:  Knee pain, new, uncertain etiology DM.  She may need increased rx NASH.  Weight-loss would help this.

## 2011-10-21 NOTE — Patient Instructions (Addendum)
blood tests and x-rays are being requested for you today.  You will receive a letter with results. Please come back for a follow-up appointment in 3 months Weight-loss helps heal your liver. Refer to an orthopedic specialist.  you will receive a phone call, about a day and time for an appointment. (see letter)

## 2011-10-22 ENCOUNTER — Ambulatory Visit: Payer: Medicare Other | Admitting: Endocrinology

## 2011-10-23 ENCOUNTER — Telehealth: Payer: Self-pay | Admitting: *Deleted

## 2011-10-23 NOTE — Telephone Encounter (Signed)
Called pt to inform of knee xray results, pt informed (letter also mailed to pt).

## 2011-10-24 ENCOUNTER — Encounter: Payer: Self-pay | Admitting: Endocrinology

## 2011-10-25 ENCOUNTER — Telehealth: Payer: Self-pay | Admitting: Endocrinology

## 2011-10-25 MED ORDER — ROSUVASTATIN CALCIUM 10 MG PO TABS: 10.0000 mg | ORAL_TABLET | Freq: Every day | ORAL | Status: DC

## 2011-10-25 NOTE — Telephone Encounter (Signed)
Left message for pt to callback office.  

## 2011-10-25 NOTE — Telephone Encounter (Signed)
Change to crestor.  i have sent a prescription to your pharmacy

## 2011-10-25 NOTE — Telephone Encounter (Signed)
Pt thinks she is having problem from lipitor and desire med change--pt ph# [phone] Pharm

## 2011-10-28 NOTE — Telephone Encounter (Signed)
Pt informed of new rx for Crestor.

## 2011-11-01 ENCOUNTER — Telehealth: Payer: Self-pay | Admitting: *Deleted

## 2011-11-01 NOTE — Telephone Encounter (Signed)
Pt called to report some blood sugar lows. Yesterday her CBG was 76 when she woke up and 61 when she went to bed. She states that she is not eating a lot with meals and has been drinking orange juice and eating sweets to get CBG to rise when it drops.

## 2011-11-01 NOTE — Telephone Encounter (Signed)
Pt informed of new insulin dosage.

## 2011-11-01 NOTE — Telephone Encounter (Signed)
Reduce insulin to 65 am and 45 pm

## 2011-11-26 ENCOUNTER — Ambulatory Visit (INDEPENDENT_AMBULATORY_CARE_PROVIDER_SITE_OTHER): Payer: Medicare Other | Admitting: Endocrinology

## 2011-11-26 ENCOUNTER — Encounter: Payer: Self-pay | Admitting: Endocrinology

## 2011-11-26 VITALS — BP 112/64 | HR 71 | Temp 98.4°F | Ht 61.0 in | Wt 165.0 lb

## 2011-11-26 DIAGNOSIS — G609 Hereditary and idiopathic neuropathy, unspecified: Secondary | ICD-10-CM | POA: Diagnosis not present

## 2011-11-26 MED ORDER — HYDROCODONE-ACETAMINOPHEN 5-500 MG PO TABS: 1.0000 | ORAL_TABLET | ORAL | Status: DC | PRN

## 2011-11-26 NOTE — Progress Notes (Signed)
Subjective:    Patient ID: Tammy Hughes, female    DOB: [birth year], 78 y.o.   MRN: [id number]  HPI Pt states few days of mild numbness of the left foot, and assoc pain.  Past Medical History  Diagnosis Date  . DIABETES MELLITUS, TYPE I 02/23/2007  . HYPERCHOLESTEROLEMIA 02/01/2008  . ANXIETY 03/08/2008  . PERIPHERAL NEUROPATHY 02/23/2007  . HYPERTENSION 03/08/2008  . GERD 02/23/2007  . DIVERTICULOSIS, COLON 03/08/2008  . RECTAL BLEEDING 03/21/2010  . OSTEOARTHRITIS 02/23/2007  . OSTEOPOROSIS 02/23/2007  . Urolithiasis     Past Surgical History  Procedure Date  . Cholecystectomy 1995  . Esophagogastroduodenoscopy 06/08/2002    History   Social History  . Marital Status: Married    Spouse Name: N/A    Number of Children: N/A  . Years of Education: N/A   Occupational History  . Retired    Social History Main Topics  . Smoking status: Never Smoker   . Smokeless tobacco: Not on file  . Alcohol Use: No  . Drug Use:   . Sexually Active:    Other Topics Concern  . Not on file   Social History Narrative   Sales promotion account executive    Current Outpatient Prescriptions on File Prior to Visit  Medication Sig Dispense Refill  . Calcium Carbonate-Vit D-Min (CALCIUM 600 + MINERALS) 600-200 MG-UNIT TABS Take 1 tablet by mouth daily.        . furosemide (LASIX) 20 MG tablet TAKE 1 TABLET BY MOUTH DAILY AS DIRECTED  90 tablet  2  . hydrocortisone (ANUSOL-HC) 25 MG suppository Place rectally. As needed for bleeding       . hyoscyamine (LEVBID) 0.375 MG 12 hr tablet Take 0.375 mg by mouth 2 (two) times daily as needed. As needed for abdominal pain       . insulin aspart protamine-insulin aspart (NOVOLOG MIX 70/30) (70-30) 100 UNIT/ML injection Inject into the skin 2 (two) times daily with a meal. 65 units with breakfast, and 45 units with the evening meal  45 mL  5  . mometasone-formoterol (DULERA) 100-5 MCG/ACT AERO Inhale 2 puffs into the lungs 2 (two) times daily.  1 Inhaler  prn  .  olmesartan (BENICAR) 20 MG tablet 1/2 tab daily       . omeprazole (PRILOSEC) 20 MG capsule TAKE 1 CAPSULE BY MOUTH DAILY AS        DIRECTED  30 capsule  2  . rosuvastatin (CRESTOR) 10 MG tablet Take 1 tablet (10 mg total) by mouth daily.  30 tablet  11    Allergies  Allergen Reactions  . Aspirin     Family History  Problem Relation Age of Onset  . Cancer Father     Prostate Cancer  . Cancer Sister     Breast Cancer  . Diabetes Sister   . Diabetes Sister     BP 112/64  Pulse 71  Temp(Src) 98.4 F (36.9 C) (Oral)  Ht 5\' 1"  (1.549 m)  Wt 165 lb (74.844 kg)  BMI 31.18 kg/m2  SpO2 96%  Review of Systems She has a slight headache.  Denies visual loss.  No leg weakness.      Objective:   Physical Exam VITAL SIGNS:  See vs page GENERAL: no distress Pulses: dorsalis pedis intact bilat.   Feet: no deformity.  no ulcer on the feet.  feet are of normal color and temp.  no edema Neuro: sensation is intact to touch on the feet, but slightly decreased  on the left foot.          Assessment & Plan:  Foot pain, prob neuropathic

## 2011-11-26 NOTE — Patient Instructions (Signed)
Here is a refill of your pain medication. Please come back for a follow-up appointment in 2 months.  If the symptoms are still there then, we can do further tests.

## 2011-12-10 ENCOUNTER — Other Ambulatory Visit: Payer: Self-pay | Admitting: Endocrinology

## 2011-12-25 ENCOUNTER — Other Ambulatory Visit: Payer: Self-pay | Admitting: Endocrinology

## 2012-01-17 ENCOUNTER — Encounter: Payer: Self-pay | Admitting: Endocrinology

## 2012-01-17 ENCOUNTER — Other Ambulatory Visit (INDEPENDENT_AMBULATORY_CARE_PROVIDER_SITE_OTHER): Payer: Medicare Other

## 2012-01-17 ENCOUNTER — Ambulatory Visit (INDEPENDENT_AMBULATORY_CARE_PROVIDER_SITE_OTHER): Payer: Medicare Other | Admitting: Endocrinology

## 2012-01-17 VITALS — BP 112/62 | HR 78 | Temp 98.4°F

## 2012-01-17 DIAGNOSIS — E109 Type 1 diabetes mellitus without complications: Secondary | ICD-10-CM

## 2012-01-17 LAB — HEMOGLOBIN A1C: Hgb A1c MFr Bld: 7.7 % — ABNORMAL HIGH (ref 4.6–6.5)

## 2012-01-17 NOTE — Patient Instructions (Addendum)
Until you feel better, take just 1/2 of your usual insulin amount Skip benicar and fursemide until you feel better. Please come back for a follow-up appointment in 3 months. blood tests are being requested for you today.  You will receive a letter with results.   Viral Gastroenteritis Viral gastroenteritis is also known as stomach flu. This condition affects the stomach and intestinal tract. It can cause sudden diarrhea and vomiting. The illness typically lasts 3 to 8 days. Most people develop an immune response that eventually gets rid of the virus. While this natural response develops, the virus can make you quite ill. CAUSES   Many different viruses can cause gastroenteritis, such as rotavirus or noroviruses. You can catch one of these viruses by consuming contaminated food or water. You may also catch a virus by sharing utensils or other personal items with an infected person or by touching a contaminated surface. SYMPTOMS   The most common symptoms are diarrhea and vomiting. These problems can cause a severe loss of body fluids (dehydration) and a body salt (electrolyte) imbalance. Other symptoms may include:  Fever.   Headache.   Fatigue.   Abdominal pain.  DIAGNOSIS   Your caregiver can usually diagnose viral gastroenteritis based on your symptoms and a physical exam. A stool sample may also be taken to test for the presence of viruses or other infections. TREATMENT   This illness typically goes away on its own. Treatments are aimed at rehydration. The most serious cases of viral gastroenteritis involve vomiting so severely that you are not able to keep fluids down. In these cases, fluids must be given through an intravenous line (IV). HOME CARE INSTRUCTIONS    Drink enough fluids to keep your urine clear or pale yellow. Drink small amounts of fluids frequently and increase the amounts as tolerated.   Ask your caregiver for specific rehydration instructions.   Avoid:   Foods  high in sugar.   Alcohol.   Carbonated drinks.   Tobacco.   Juice.   Caffeine drinks.   Extremely hot or cold fluids.   Fatty, greasy foods.   Too much intake of anything at one time.   Dairy products until 24 to 48 hours after diarrhea stops.   You may consume probiotics. Probiotics are active cultures of beneficial bacteria. They may lessen the amount and number of diarrheal stools in adults. Probiotics can be found in yogurt with active cultures and in supplements.   Wash your hands well to avoid spreading the virus.   Only take over-the-counter or prescription medicines for pain, discomfort, or fever as directed by your caregiver. Do not give aspirin to children. Antidiarrheal medicines are not recommended.   Ask your caregiver if you should continue to take your regular prescribed and over-the-counter medicines.   Keep all follow-up appointments as directed by your caregiver.  SEEK IMMEDIATE MEDICAL CARE IF:    You are unable to keep fluids down.   You do not urinate at least once every 6 to 8 hours.   You develop shortness of breath.   You notice blood in your stool or vomit. This may look like coffee grounds.   You have abdominal pain that increases or is concentrated in one small area (localized).   You have persistent vomiting or diarrhea.   You have a fever. MAKE SURE YOU:    Understand these instructions.   Will watch your condition.   Will get help right away if you are not doing well or  get worse.  Document Released: 07/01/2005 Document Revised: 06/20/2011 Document Reviewed: 04/17/2011 [facility] Patient Information 2012 [place], [place].

## 2012-01-17 NOTE — Progress Notes (Signed)
Subjective:    Patient ID: Tammy Hughes, female    DOB: 21-Dec-1932, 78 y.o.   MRN: [id number]  HPI Pt states 1 day of moderate cramps throughout the abdomen, and assoc diarrhea.  Denies n/v. Past Medical History  Diagnosis Date  . DIABETES MELLITUS, TYPE I 02/23/2007  . HYPERCHOLESTEROLEMIA 02/01/2008  . ANXIETY 03/08/2008  . PERIPHERAL NEUROPATHY 02/23/2007  . HYPERTENSION 03/08/2008  . GERD 02/23/2007  . DIVERTICULOSIS, COLON 03/08/2008  . RECTAL BLEEDING 03/21/2010  . OSTEOARTHRITIS 02/23/2007  . OSTEOPOROSIS 02/23/2007  . Urolithiasis     Past Surgical History  Procedure Date  . Cholecystectomy 1995  . Esophagogastroduodenoscopy 06/08/2002    History   Social History  . Marital Status: Married    Spouse Name: N/A    Number of Children: N/A  . Years of Education: N/A   Occupational History  . Retired    Social History Main Topics  . Smoking status: Never Smoker   . Smokeless tobacco: Not on file  . Alcohol Use: No  . Drug Use:   . Sexually Active:    Other Topics Concern  . Not on file   Social History Narrative   Sales promotion account executive    Current Outpatient Prescriptions on File Prior to Visit  Medication Sig Dispense Refill  . Calcium Carbonate-Vit D-Min (CALCIUM 600 + MINERALS) 600-200 MG-UNIT TABS Take 1 tablet by mouth daily.        . furosemide (LASIX) 20 MG tablet TAKE 1 TABLET BY MOUTH DAILY AS DIRECTED  90 tablet  2  . HYDROcodone-acetaminophen (VICODIN) 5-500 MG per tablet Take 1 tablet by mouth every 4 (four) hours as needed. For pain  50 tablet  5  . hydrocortisone (ANUSOL-HC) 25 MG suppository Place rectally. As needed for bleeding       . hyoscyamine (LEVBID) 0.375 MG 12 hr tablet Take 0.375 mg by mouth 2 (two) times daily as needed. As needed for abdominal pain       . insulin aspart protamine-insulin aspart (NOVOLOG MIX 70/30) (70-30) 100 UNIT/ML injection Inject into the skin 2 (two) times daily with a meal. 65 units with breakfast, and 45  units with the evening meal  45 mL  5  . mometasone-formoterol (DULERA) 100-5 MCG/ACT AERO Inhale 2 puffs into the lungs 2 (two) times daily.  1 Inhaler  prn  . olmesartan (BENICAR) 20 MG tablet 1/2 tab daily       . omeprazole (PRILOSEC) 20 MG capsule TAKE 1 CAPSULE BY MOUTH DAILY AS        DIRECTED  30 capsule  5  . rosuvastatin (CRESTOR) 10 MG tablet Take 1 tablet (10 mg total) by mouth daily.  30 tablet  11    Allergies  Allergen Reactions  . Aspirin     Family History  Problem Relation Age of Onset  . Cancer Father     Prostate Cancer  . Cancer Sister     Breast Cancer  . Diabetes Sister   . Diabetes Sister     BP 112/62  Pulse 78  Temp 98.4 F (36.9 C) (Oral)  SpO2 94%  Review of Systems She had hypoglycemia yesterday, but no loc.      Objective:   Physical Exam VITAL SIGNS:  See vs page GENERAL: no distress. ABDOMEN: abdomen is soft, nontender.  no hepatosplenomegaly.  not distended.  no hernia.  Lab Results  Component Value Date   HGBA1C 7.7* 01/17/2012      Assessment & Plan:  Acute ge, new Dm.  this is the best control this pt should aim for, given this regimen, which does match insulin to her changing needs throughout the day HTN, now overcontrolled due to acute illness

## 2012-01-18 ENCOUNTER — Encounter: Payer: Self-pay | Admitting: Endocrinology

## 2012-01-20 ENCOUNTER — Ambulatory Visit: Payer: Medicare Other | Admitting: Endocrinology

## 2012-01-20 ENCOUNTER — Telehealth: Payer: Self-pay | Admitting: *Deleted

## 2012-01-20 NOTE — Telephone Encounter (Signed)
Called pt to inform of lab results, pt informed (letter also mailed to pt). 

## 2012-03-10 ENCOUNTER — Other Ambulatory Visit: Payer: Self-pay | Admitting: Endocrinology

## 2012-03-11 ENCOUNTER — Ambulatory Visit: Payer: Medicare Other | Admitting: Internal Medicine

## 2012-03-12 ENCOUNTER — Encounter: Payer: Self-pay | Admitting: Internal Medicine

## 2012-03-12 ENCOUNTER — Ambulatory Visit (INDEPENDENT_AMBULATORY_CARE_PROVIDER_SITE_OTHER): Payer: Medicare Other | Admitting: Internal Medicine

## 2012-03-12 VITALS — BP 102/58 | HR 53 | Ht 61.0 in | Wt 163.0 lb

## 2012-03-12 DIAGNOSIS — J310 Chronic rhinitis: Secondary | ICD-10-CM | POA: Diagnosis not present

## 2012-03-12 DIAGNOSIS — R05 Cough: Secondary | ICD-10-CM

## 2012-03-12 NOTE — Patient Instructions (Addendum)
Please call if needed, or ask Dr Everardo All to send you back if there is a problem I can help you with.

## 2012-03-12 NOTE — Progress Notes (Signed)
08/08/11- 78 yoF never smoker referred by Dr Everardo All because of cough. She complains now of cough over the past 2-3 weeks but says cough was worse a month ago when she moved to this home,  which is carpeted. She has no prior history of respiratory problems or cough except for mild seasonal allergy with nasal congestion. Her recent acute exacerbation would be consistent with a superimposed viral bronchitis typical of what is in town. She ended antibiotic therapy yesterday. At its worst, she was wheezing a little but had little sputum. She denies current fever, significant sputum, chest pain. Admits acid heartburn, sore throats, sneeze and itch, occasional earache. No history of ENT surgery or tuberculosis. She used a sample of Advair for 1 week, saw little effect, and did not refill it.  CXR 08/01/2011 indicated mild bronchitis. I reviewed the images. Complicated medical problems were reviewed, including diabetes, hypertension, GERD. She lives with husband and 2 children. One son has asthma. She has a total of 5 children, 4 adopted. Homemaker.  09/11/11- 78 yo F never smoker followed for cough, allergic rhinitis, complicated by DM, allergic rhinitis, GERD Dulera sample was some help. She now has a cold with clear mucus, no fever. Her chronic dry cough wakes her at night. Allergy profile 08/12/2011-total IgE less than 1.5 with no specific elevations. I explained that this makes a significant inhalant allergy very unlikely.  03/12/12-  78 yo F never smoker followed for cough,  rhinitis, complicated by DM,  GERD Not using Dulera or Tessalon lately-felt like she hasnt needed it. Denies any cough at this time Took up old carpet in the bedroom. Not needing breathing medicines. Cough gone.  ROS-see HPI Constitutional:   No-   weight loss, night sweats, fevers, chills, fatigue, lassitude. HEENT:   No-  headaches, difficulty swallowing, tooth/dental problems, sore throat,       No-  sneezing, itching, ear  ache, +nasal congestion, post nasal drip,  CV:  No-   chest pain, orthopnea, PND, swelling in lower extremities, anasarca, dizziness, palpitations Resp: No-   shortness of breath with exertion or at rest.              No-   productive cough, non-productive cough,  No- coughing up of blood.                No-change in color of mucus.  No- wheezing.   Skin: No-   rash or lesions. GI:  No-   heartburn, indigestion, abdominal pain, nausea, vomiting,  GU:  MS:  +  joint pain or swelling.  . Neuro-     nothing unusual Psych:  No- change in mood or affect. No depression or anxiety.  No memory loss.  OBJ General- Alert, Oriented, Affect-appropriate, Distress- none acute Skin- rash-none, lesions- none, excoriation- none Lymphadenopathy- none Head- atraumatic            Eyes- Gross vision intact, PERRLA, conjunctivae clear secretions            Ears- Hearing, canals-normal            Nose- Clear, no-Septal dev, mucus, polyps, erosion, perforation             Throat- Mallampati IV , mucosa clear , drainage- none, tonsils- atrophic Neck- flexible , trachea midline, no stridor , thyroid nl, carotid no bruit Chest - symmetrical excursion , unlabored           Heart/CV- RRR , no murmur , no gallop  ,  no rub, nl s1 s2                           - JVD- none , +edema- trace, stasis changes- none, varices- none           Lung- clear to P&A, wheeze- none, No- cough , dullness-none, rub- none           Chest wall-  Abd- Br/ Gen/ Rectal- Not done, not indicated Extrem- cyanosis- none, clubbing, none, atrophy- none, strength- nl Neuro- grossly intact to observation

## 2012-03-22 NOTE — Assessment & Plan Note (Signed)
Cough is resolved with no medication required.

## 2012-03-22 NOTE — Assessment & Plan Note (Signed)
Symptoms may have improved after she removed an old carpet from the bedroom, but timing may not be correct. She is off all medications now

## 2012-03-23 DIAGNOSIS — H168 Other keratitis: Secondary | ICD-10-CM | POA: Diagnosis not present

## 2012-04-01 DIAGNOSIS — H02839 Dermatochalasis of unspecified eye, unspecified eyelid: Secondary | ICD-10-CM | POA: Diagnosis not present

## 2012-04-04 ENCOUNTER — Other Ambulatory Visit: Payer: Self-pay | Admitting: Endocrinology

## 2012-04-16 ENCOUNTER — Encounter: Payer: Self-pay | Admitting: Endocrinology

## 2012-04-16 ENCOUNTER — Ambulatory Visit (INDEPENDENT_AMBULATORY_CARE_PROVIDER_SITE_OTHER): Payer: Medicare Other | Admitting: Endocrinology

## 2012-04-16 VITALS — BP 126/80 | HR 80 | Temp 98.2°F | Wt 162.0 lb

## 2012-04-16 DIAGNOSIS — Z23 Encounter for immunization: Secondary | ICD-10-CM | POA: Diagnosis not present

## 2012-04-16 DIAGNOSIS — E109 Type 1 diabetes mellitus without complications: Secondary | ICD-10-CM | POA: Diagnosis not present

## 2012-04-16 LAB — HEMOGLOBIN A1C: Hgb A1c MFr Bld: 8.4 % — ABNORMAL HIGH (ref <5.7)

## 2012-04-16 NOTE — Patient Instructions (Addendum)
Please come back for a "medicare wellness" appointment in on or after 07/23/12. Your diabetes blood test is requested for you today.  You will be contacted with results. check your blood sugar twice a day.  vary the time of day when you check, between before the 3 meals, and at bedtime.  also check if you have symptoms of your blood sugar being too high or too low.  please keep a record of the readings and bring it to your next appointment here.  please call us sooner if your blood sugar goes below 70, or if you have a lot of readings over 200.

## 2012-04-16 NOTE — Progress Notes (Signed)
Subjective:    Patient ID: Tammy Hughes, female    DOB: Mar 11, 1933, 78 y.o.   MRN: [id number]  HPI Pt returns for f/u of insulin-requiring DM (dx'ed 1990; complicated by peripheral sensory neuropathy).  pt states she feels well in general.  no cbg record, but states cbg's vary from 130-200.  There is no trend throughout the day.  Past Medical History  Diagnosis Date  . DIABETES MELLITUS, TYPE I 02/23/2007  . HYPERCHOLESTEROLEMIA 02/01/2008  . ANXIETY 03/08/2008  . PERIPHERAL NEUROPATHY 02/23/2007  . HYPERTENSION 03/08/2008  . GERD 02/23/2007  . DIVERTICULOSIS, COLON 03/08/2008  . RECTAL BLEEDING 03/21/2010  . OSTEOARTHRITIS 02/23/2007  . OSTEOPOROSIS 02/23/2007  . Urolithiasis     Past Surgical History  Procedure Date  . Cholecystectomy 1995  . Esophagogastroduodenoscopy 06/08/2002    History   Social History  . Marital Status: Married    Spouse Name: N/A    Number of Children: N/A  . Years of Education: N/A   Occupational History  . Retired    Social History Main Topics  . Smoking status: Never Smoker   . Smokeless tobacco: Not on file  . Alcohol Use: No  . Drug Use:   . Sexually Active:    Other Topics Concern  . Not on file   Social History Narrative   Sales promotion account executive    Current Outpatient Prescriptions on File Prior to Visit  Medication Sig Dispense Refill  . Calcium Carbonate-Vit D-Min (CALCIUM 600 + MINERALS) 600-200 MG-UNIT TABS Take 1 tablet by mouth daily.        . furosemide (LASIX) 20 MG tablet TAKE 1 TABLET BY MOUTH DAILY AS DIRECTED  90 tablet  2  . HYDROcodone-acetaminophen (VICODIN) 5-500 MG per tablet Take 1 tablet by mouth every 4 (four) hours as needed. For pain  50 tablet  5  . hydrocortisone (ANUSOL-HC) 25 MG suppository Place rectally. As needed for bleeding       . hyoscyamine (LEVBID) 0.375 MG 12 hr tablet Take 0.375 mg by mouth 2 (two) times daily as needed. As needed for abdominal pain       . insulin aspart protamine-insulin  aspart (NOVOLOG MIX 70/30 FLEXPEN) (70-30) 100 UNIT/ML injection Inject 65 units SQ daily with breakfast and inject 45 units SQ daily with evening meal as directed.  45 mL  2  . mometasone-formoterol (DULERA) 100-5 MCG/ACT AERO Inhale 2 puffs into the lungs 2 (two) times daily.  1 Inhaler  prn  . olmesartan (BENICAR) 20 MG tablet 1/2 tab daily       . omeprazole (PRILOSEC) 20 MG capsule TAKE 1 CAPSULE BY MOUTH DAILY AS        DIRECTED  30 capsule  5  . rosuvastatin (CRESTOR) 10 MG tablet Take 1 tablet (10 mg total) by mouth daily.  30 tablet  11    Allergies  Allergen Reactions  . Aspirin     Family History  Problem Relation Age of Onset  . Cancer Father     Prostate Cancer  . Cancer Sister     Breast Cancer  . Diabetes Sister   . Diabetes Sister     BP 126/80  Pulse 80  Temp 98.2 F (36.8 C) (Oral)  Wt 162 lb (73.483 kg)  Review of Systems denies hypoglycemia    Objective:   Physical Exam VITAL SIGNS:  See vs page GENERAL: no distress Pulses: dorsalis pedis intact bilat.   Feet: no deformity.  no ulcer on the feet.  feet are of normal color and temp.  Trace bilat leg edema Neuro: sensation is intact to touch on the feet.     Assessment & Plan:  DM, uncertain control

## 2012-04-17 ENCOUNTER — Ambulatory Visit (INDEPENDENT_AMBULATORY_CARE_PROVIDER_SITE_OTHER): Payer: Medicare Other | Admitting: Endocrinology

## 2012-04-17 DIAGNOSIS — E109 Type 1 diabetes mellitus without complications: Secondary | ICD-10-CM

## 2012-04-20 ENCOUNTER — Other Ambulatory Visit: Payer: Self-pay | Admitting: General Practice

## 2012-04-20 MED ORDER — OLMESARTAN MEDOXOMIL 20 MG PO TABS: ORAL_TABLET | ORAL | Status: DC

## 2012-04-20 NOTE — Telephone Encounter (Signed)
Please refill x 1 year

## 2012-04-20 NOTE — Telephone Encounter (Signed)
Refill req for Benicar, historical med. Pt last seen on 10/3. Please advise. Thanks.

## 2012-04-20 NOTE — Telephone Encounter (Signed)
Done

## 2012-05-15 DIAGNOSIS — H5340 Unspecified visual field defects: Secondary | ICD-10-CM | POA: Diagnosis not present

## 2012-05-15 DIAGNOSIS — H02839 Dermatochalasis of unspecified eye, unspecified eyelid: Secondary | ICD-10-CM | POA: Diagnosis not present

## 2012-06-09 ENCOUNTER — Other Ambulatory Visit: Payer: Self-pay | Admitting: *Deleted

## 2012-06-09 MED ORDER — OMEPRAZOLE 20 MG PO CPDR: 20.0000 mg | DELAYED_RELEASE_CAPSULE | Freq: Every day | ORAL | Status: DC

## 2012-06-23 DIAGNOSIS — M546 Pain in thoracic spine: Secondary | ICD-10-CM | POA: Diagnosis not present

## 2012-06-23 DIAGNOSIS — J40 Bronchitis, not specified as acute or chronic: Secondary | ICD-10-CM | POA: Diagnosis not present

## 2012-06-23 DIAGNOSIS — IMO0002 Reserved for concepts with insufficient information to code with codable children: Secondary | ICD-10-CM | POA: Diagnosis not present

## 2012-07-06 DIAGNOSIS — J40 Bronchitis, not specified as acute or chronic: Secondary | ICD-10-CM | POA: Diagnosis not present

## 2012-07-06 DIAGNOSIS — F411 Generalized anxiety disorder: Secondary | ICD-10-CM | POA: Diagnosis not present

## 2012-07-06 DIAGNOSIS — M546 Pain in thoracic spine: Secondary | ICD-10-CM | POA: Diagnosis not present

## 2012-07-28 ENCOUNTER — Ambulatory Visit (INDEPENDENT_AMBULATORY_CARE_PROVIDER_SITE_OTHER): Payer: Medicare Other | Admitting: Endocrinology

## 2012-07-28 VITALS — BP 122/80 | HR 75 | Wt 164.0 lb

## 2012-07-28 DIAGNOSIS — I10 Essential (primary) hypertension: Secondary | ICD-10-CM

## 2012-07-28 DIAGNOSIS — Z79899 Other long term (current) drug therapy: Secondary | ICD-10-CM | POA: Diagnosis not present

## 2012-07-28 DIAGNOSIS — K7689 Other specified diseases of liver: Secondary | ICD-10-CM | POA: Diagnosis not present

## 2012-07-28 DIAGNOSIS — E7800 Pure hypercholesterolemia, unspecified: Secondary | ICD-10-CM

## 2012-07-28 DIAGNOSIS — G609 Hereditary and idiopathic neuropathy, unspecified: Secondary | ICD-10-CM

## 2012-07-28 DIAGNOSIS — J069 Acute upper respiratory infection, unspecified: Secondary | ICD-10-CM

## 2012-07-28 DIAGNOSIS — D72819 Decreased white blood cell count, unspecified: Secondary | ICD-10-CM

## 2012-07-28 DIAGNOSIS — E109 Type 1 diabetes mellitus without complications: Secondary | ICD-10-CM

## 2012-07-28 DIAGNOSIS — Z0000 Encounter for general adult medical examination without abnormal findings: Secondary | ICD-10-CM | POA: Diagnosis not present

## 2012-07-28 DIAGNOSIS — K7581 Nonalcoholic steatohepatitis (NASH): Secondary | ICD-10-CM

## 2012-07-28 LAB — CBC WITH DIFFERENTIAL/PLATELET
Basophils Absolute: 0 10*3/uL (ref 0.0–0.1)
Basophils Relative: 0.6 % (ref 0.0–3.0)
Eosinophils Absolute: 0.1 10*3/uL (ref 0.0–0.7)
HCT: 39.3 % (ref 36.0–46.0)
Hemoglobin: 13 g/dL (ref 12.0–15.0)
Lymphs Abs: 1.9 10*3/uL (ref 0.7–4.0)
MCHC: 33 g/dL (ref 30.0–36.0)
Monocytes Relative: 7.8 % (ref 3.0–12.0)
Neutro Abs: 2.7 10*3/uL (ref 1.4–7.7)
RDW: 13.4 % (ref 11.5–14.6)

## 2012-07-28 LAB — URINALYSIS, ROUTINE W REFLEX MICROSCOPIC
Ketones, ur: NEGATIVE
Specific Gravity, Urine: 1.01 (ref 1.000–1.030)
Total Protein, Urine: NEGATIVE
Urine Glucose: NEGATIVE
pH: 5.5 (ref 5.0–8.0)

## 2012-07-28 LAB — BASIC METABOLIC PANEL
CO2: 27 mEq/L (ref 19–32)
Calcium: 10.1 mg/dL (ref 8.4–10.5)
Creatinine, Ser: 1 mg/dL (ref 0.4–1.2)
Glucose, Bld: 63 mg/dL — ABNORMAL LOW (ref 70–99)

## 2012-07-28 LAB — LIPID PANEL
Cholesterol: 135 mg/dL (ref 0–200)
Triglycerides: 170 mg/dL — ABNORMAL HIGH (ref 0.0–149.0)

## 2012-07-28 LAB — HEPATIC FUNCTION PANEL
AST: 28 U/L (ref 0–37)
Albumin: 3.7 g/dL (ref 3.5–5.2)
Total Protein: 7.4 g/dL (ref 6.0–8.3)

## 2012-07-28 LAB — VITAMIN B12: Vitamin B-12: 327 pg/mL (ref 211–911)

## 2012-07-28 LAB — HEMOGLOBIN A1C: Hgb A1c MFr Bld: 8 % — ABNORMAL HIGH (ref 4.6–6.5)

## 2012-07-28 MED ORDER — HYDROCODONE-ACETAMINOPHEN 5-500 MG PO TABS: 1.0000 | ORAL_TABLET | ORAL | Status: DC | PRN

## 2012-07-28 NOTE — Progress Notes (Signed)
Subjective:    Patient ID: Tammy Hughes, female    DOB: [birth year], 79 y.o.   MRN: [id number]  HPI Pt states of moderate arthralgias, worst at the lower back and hands.  No assoc sxs. no cbg record, but states cbg's are well-controlled.  There is no trend throughout the day.   Past Medical History  Diagnosis Date  . DIABETES MELLITUS, TYPE I 02/23/2007  . HYPERCHOLESTEROLEMIA 02/01/2008  . ANXIETY 03/08/2008  . PERIPHERAL NEUROPATHY 02/23/2007  . HYPERTENSION 03/08/2008  . GERD 02/23/2007  . DIVERTICULOSIS, COLON 03/08/2008  . RECTAL BLEEDING 03/21/2010  . OSTEOARTHRITIS 02/23/2007  . OSTEOPOROSIS 02/23/2007  . Urolithiasis     Past Surgical History  Procedure Date  . Cholecystectomy 1995  . Esophagogastroduodenoscopy 06/08/2002    History   Social History  . Marital Status: Married    Spouse Name: N/A    Number of Children: N/A  . Years of Education: N/A   Occupational History  . Retired    Social History Main Topics  . Smoking status: Never Smoker   . Smokeless tobacco: Not on file  . Alcohol Use: No  . Drug Use:   . Sexually Active:    Other Topics Concern  . Not on file   Social History Narrative   Sales promotion account executive    Current Outpatient Prescriptions on File Prior to Visit  Medication Sig Dispense Refill  . Calcium Carbonate-Vit D-Min (CALCIUM 600 + MINERALS) 600-200 MG-UNIT TABS Take 1 tablet by mouth daily.        . furosemide (LASIX) 20 MG tablet TAKE 1 TABLET BY MOUTH DAILY AS DIRECTED  90 tablet  2  . hydrocortisone (ANUSOL-HC) 25 MG suppository Place rectally. As needed for bleeding       . hyoscyamine (LEVBID) 0.375 MG 12 hr tablet Take 0.375 mg by mouth 2 (two) times daily as needed. As needed for abdominal pain       . mometasone-formoterol (DULERA) 100-5 MCG/ACT AERO Inhale 2 puffs into the lungs 2 (two) times daily.  1 Inhaler  prn  . olmesartan (BENICAR) 20 MG tablet 1/2 tab daily  60 tablet  11  . omeprazole (PRILOSEC) 20 MG capsule Take  1 capsule (20 mg total) by mouth daily.  30 capsule  5  . rosuvastatin (CRESTOR) 10 MG tablet Take 1 tablet (10 mg total) by mouth daily.  30 tablet  11  . insulin aspart protamine-insulin aspart (NOVOLOG 70/30) (70-30) 100 UNIT/ML injection Inject 70 units SQ daily with breakfast and inject 60 units SQ daily with evening meal  10 mL  10    Allergies  Allergen Reactions  . Aspirin     Family History  Problem Relation Age of Onset  . Cancer Father     Prostate Cancer  . Cancer Sister     Breast Cancer  . Diabetes Sister   . Diabetes Sister     BP 122/80  Pulse 75  Wt 164 lb (74.39 kg)  SpO2 95%   Review of Systems denies hypoglycemia and sob.      Objective:   Physical Exam VITAL SIGNS:  See vs page. GENERAL: no distress. Spine: nontender. Hands: oa changes.   Lab Results  Component Value Date   WBC 5.2 07/28/2012   HGB 13.0 07/28/2012   HCT 39.3 07/28/2012   PLT 257.0 07/28/2012   GLUCOSE 63* 07/28/2012   CHOL 135 07/28/2012   TRIG 170.0* 07/28/2012   HDL 37.60* 07/28/2012   LDLDIRECT 81.9 07/23/2011  LDLCALC 63 07/28/2012   ALT 28 07/28/2012   AST 28 07/28/2012   NA 141 07/28/2012   K 3.7 07/28/2012   CL 107 07/28/2012   CREATININE 1.0 07/28/2012   BUN 19 07/28/2012   CO2 27 07/28/2012   TSH 0.87 07/28/2012   INR 1.0 ratio 03/21/2010   HGBA1C 8.0* 07/28/2012   MICROALBUR 1.0 07/28/2012   (i reviewed old x-ray reports of spine and right hand)    Assessment & Plan:  DM: needs increased rx Arthralgias, new, due to OA Dyslipidemia, well-controlled    Subjective:   Patient here for Medicare annual wellness visit and management of other chronic and acute problems.     Risk factors: advanced age    Roster of Physicians Providing Medical Care to Patient:  See "snapshot"   Activities of Daily Living: In your present state of health, do you have any difficulty performing the following activities?:  Preparing food and eating?: No  Bathing yourself: No  Getting  dressed: No  Using the toilet: No  Moving around from place to place: No  In the past year have you fallen or had a near fall?: No    Home Safety: Has smoke detector and wears seat belts. No firearms.   Diet and Exercise  Current exercise habits:  Pt says good Dietary issues discussed: pt reports a healthy diet   Depression Screen  Q1: Over the past two weeks, have you felt down, depressed or hopeless? no  Q2: Over the past two weeks, have you felt little interest or pleasure in doing things? no   The following portions of the patient's history were reviewed and updated as appropriate: allergies, current medications, past family history, past medical history, past social history, past surgical history and problem list.  Past Medical History  Diagnosis Date  . DIABETES MELLITUS, TYPE I 02/23/2007  . HYPERCHOLESTEROLEMIA 02/01/2008  . ANXIETY 03/08/2008  . PERIPHERAL NEUROPATHY 02/23/2007  . HYPERTENSION 03/08/2008  . GERD 02/23/2007  . DIVERTICULOSIS, COLON 03/08/2008  . RECTAL BLEEDING 03/21/2010  . OSTEOARTHRITIS 02/23/2007  . OSTEOPOROSIS 02/23/2007  . Urolithiasis     Past Surgical History  Procedure Date  . Cholecystectomy 1995  . Esophagogastroduodenoscopy 06/08/2002    History   Social History  . Marital Status: Married    Spouse Name: N/A    Number of Children: N/A  . Years of Education: N/A   Occupational History  . Retired    Social History Main Topics  . Smoking status: Never Smoker   . Smokeless tobacco: Not on file  . Alcohol Use: No  . Drug Use:   . Sexually Active:    Other Topics Concern  . Not on file   Social History Narrative   Sales promotion account executive    Current Outpatient Prescriptions on File Prior to Visit  Medication Sig Dispense Refill  . Calcium Carbonate-Vit D-Min (CALCIUM 600 + MINERALS) 600-200 MG-UNIT TABS Take 1 tablet by mouth daily.        . furosemide (LASIX) 20 MG tablet TAKE 1 TABLET BY MOUTH DAILY AS DIRECTED  90 tablet  2  .  hydrocortisone (ANUSOL-HC) 25 MG suppository Place rectally. As needed for bleeding       . hyoscyamine (LEVBID) 0.375 MG 12 hr tablet Take 0.375 mg by mouth 2 (two) times daily as needed. As needed for abdominal pain       . insulin aspart protamine-insulin aspart (NOVOLOG 70/30) (70-30) 100 UNIT/ML injection Inject 70 units SQ daily with  breakfast and inject 50 units SQ daily with evening meal      . mometasone-formoterol (DULERA) 100-5 MCG/ACT AERO Inhale 2 puffs into the lungs 2 (two) times daily.  1 Inhaler  prn  . olmesartan (BENICAR) 20 MG tablet 1/2 tab daily  60 tablet  11  . omeprazole (PRILOSEC) 20 MG capsule Take 1 capsule (20 mg total) by mouth daily.  30 capsule  5  . rosuvastatin (CRESTOR) 10 MG tablet Take 1 tablet (10 mg total) by mouth daily.  30 tablet  11    Allergies  Allergen Reactions  . Aspirin     Family History  Problem Relation Age of Onset  . Cancer Father     Prostate Cancer  . Cancer Sister     Breast Cancer  . Diabetes Sister   . Diabetes Sister     BP 122/80  Pulse 75  Wt 164 lb (74.39 kg)  SpO2 95%   Review of Systems  Denies hearing loss, and visual loss Objective:   Vision:  Sees opthalmologist Hearing: grossly normal Body mass index:  See vs page Msk: pt easily and quickly performs "get-up-and-go" from a sitting position Cognitive Impairment Assessment: cognition, memory and judgment appear normal.  remembers 3/3 at 5 minutes.  excellent recall.  can easily read and write a sentence.  alert and oriented x 3   Assessment:   Medicare wellness utd on preventive parameters    Plan:   During the course of the visit the patient was educated and counseled about appropriate screening and preventive services including:        Fall prevention   Screening mammography  Bone densitometry screening  Diabetes screening  Nutrition counseling   Vaccines / LABS Zostavax / Pnemonccoal Vaccine  today   Patient Instructions (the written plan) was  given to the patient.

## 2012-07-28 NOTE — Patient Instructions (Addendum)
Here is a refill of your pain medicine. please consider these measures for your health:  minimize alcohol.  do not use tobacco products.  have a colonoscopy at least every 10 years from age 77.  Women should have an annual mammogram from age 71.  keep firearms safely stored.  always use seat belts.  have working smoke alarms in your home.  see an eye doctor and dentist regularly.  never drive under the influence of alcohol or drugs (including prescription drugs).   please let me know what your wishes would be, if artificial life support measures should become necessary.  it is critically important to prevent falling down (keep floor areas well-lit, dry, and free of loose objects.  If you have a cane, walker, or wheelchair, you should use it, even for short trips around the house.  Also, try not to rush). [phone], to make an appointment for a mammogram. Please come back for a follow-up appointment in 3 months

## 2012-07-29 ENCOUNTER — Other Ambulatory Visit: Payer: Self-pay | Admitting: *Deleted

## 2012-07-29 ENCOUNTER — Telehealth: Payer: Self-pay | Admitting: Endocrinology

## 2012-07-29 MED ORDER — INSULIN ASPART PROT & ASPART (70-30 MIX) 100 UNIT/ML ~~LOC~~ SUSP: SUBCUTANEOUS | Status: DC

## 2012-07-29 NOTE — Telephone Encounter (Signed)
rx called in with 6 refills

## 2012-07-29 NOTE — Telephone Encounter (Signed)
The patient called to check status of Benicar 20mg  rx refill.  The patient may be reached at [phone] if needed.

## 2012-08-06 MED ORDER — OLMESARTAN MEDOXOMIL 20 MG PO TABS: ORAL_TABLET | ORAL | Status: DC

## 2012-08-06 NOTE — Telephone Encounter (Signed)
The patient states that the pharmacy does not have her Benicar rx.  Is is possible to resend the rx?

## 2012-08-07 ENCOUNTER — Other Ambulatory Visit: Payer: Self-pay | Admitting: Endocrinology

## 2012-08-07 ENCOUNTER — Telehealth: Payer: Self-pay | Admitting: Endocrinology

## 2012-08-07 MED ORDER — LOSARTAN POTASSIUM 25 MG PO TABS: 25.0000 mg | ORAL_TABLET | Freq: Every day | ORAL | Status: DC

## 2012-08-07 NOTE — Telephone Encounter (Signed)
Pt advised rx sent to pharmacy. 

## 2012-08-07 NOTE — Telephone Encounter (Signed)
please call patient: Please change benicar to losartan i have sent a prescription to your pharmacy

## 2012-08-21 ENCOUNTER — Encounter: Payer: Self-pay | Admitting: Endocrinology

## 2012-08-24 NOTE — Progress Notes (Signed)
  Subjective:    Patient ID: Tammy Hughes, female    DOB: 10-03-1932, 79 y.o.   MRN: [id number]  HPI  x  Review of Systems     Objective:   Physical Exam        Assessment & Plan:

## 2012-09-16 DIAGNOSIS — E109 Type 1 diabetes mellitus without complications: Secondary | ICD-10-CM | POA: Diagnosis not present

## 2012-09-24 ENCOUNTER — Other Ambulatory Visit: Payer: Self-pay

## 2012-09-24 MED ORDER — ROSUVASTATIN CALCIUM 10 MG PO TABS: 10.0000 mg | ORAL_TABLET | Freq: Every day | ORAL | Status: DC

## 2012-10-05 DIAGNOSIS — F411 Generalized anxiety disorder: Secondary | ICD-10-CM | POA: Diagnosis not present

## 2012-10-05 DIAGNOSIS — E785 Hyperlipidemia, unspecified: Secondary | ICD-10-CM | POA: Diagnosis not present

## 2012-10-05 DIAGNOSIS — I10 Essential (primary) hypertension: Secondary | ICD-10-CM | POA: Diagnosis not present

## 2012-10-05 DIAGNOSIS — M129 Arthropathy, unspecified: Secondary | ICD-10-CM | POA: Diagnosis not present

## 2012-10-16 ENCOUNTER — Ambulatory Visit (INDEPENDENT_AMBULATORY_CARE_PROVIDER_SITE_OTHER): Payer: Medicare Other | Admitting: Endocrinology

## 2012-10-16 ENCOUNTER — Encounter: Payer: Self-pay | Admitting: Endocrinology

## 2012-10-16 VITALS — BP 134/70 | HR 80 | Wt 165.0 lb

## 2012-10-16 DIAGNOSIS — E109 Type 1 diabetes mellitus without complications: Secondary | ICD-10-CM

## 2012-10-16 DIAGNOSIS — M549 Dorsalgia, unspecified: Secondary | ICD-10-CM | POA: Diagnosis not present

## 2012-10-16 DIAGNOSIS — R109 Unspecified abdominal pain: Secondary | ICD-10-CM | POA: Diagnosis not present

## 2012-10-16 LAB — URINALYSIS, ROUTINE W REFLEX MICROSCOPIC
Nitrite: NEGATIVE
Total Protein, Urine: NEGATIVE
Urine Glucose: NEGATIVE
pH: 5.5 (ref 5.0–8.0)

## 2012-10-16 NOTE — Progress Notes (Signed)
Subjective:    Patient ID: Tammy Hughes, female    DOB: 03/22/1933, 79 y.o.   MRN: [id number]  HPI Pt states few days of moderate pain at the left flank, but no assoc numbness.  Pain is exac by movement of the torso Pt returns for f/u of insulin-requiring DM (dx'ed 1990; complicated by peripheral sensory neuropathy).  pt states she feels well in general.  no cbg record, but states cbg's vary from 130-200.  There is no trend throughout the day. She denies hypoglycemia Past Medical History  Diagnosis Date  . DIABETES MELLITUS, TYPE I 02/23/2007  . HYPERCHOLESTEROLEMIA 02/01/2008  . ANXIETY 03/08/2008  . PERIPHERAL NEUROPATHY 02/23/2007  . HYPERTENSION 03/08/2008  . GERD 02/23/2007  . DIVERTICULOSIS, COLON 03/08/2008  . RECTAL BLEEDING 03/21/2010  . OSTEOARTHRITIS 02/23/2007  . OSTEOPOROSIS 02/23/2007  . Urolithiasis     Past Surgical History  Procedure Laterality Date  . Cholecystectomy  1995  . Esophagogastroduodenoscopy  06/08/2002    History   Social History  . Marital Status: Married    Spouse Name: N/A    Number of Children: N/A  . Years of Education: N/A   Occupational History  . Retired    Social History Main Topics  . Smoking status: Never Smoker   . Smokeless tobacco: Not on file  . Alcohol Use: No  . Drug Use:   . Sexually Active:    Other Topics Concern  . Not on file   Social History Narrative   Sales promotion account executive          Current Outpatient Prescriptions on File Prior to Visit  Medication Sig Dispense Refill  . Calcium Carbonate-Vit D-Min (CALCIUM 600 + MINERALS) 600-200 MG-UNIT TABS Take 1 tablet by mouth daily.        . furosemide (LASIX) 20 MG tablet TAKE 1 TABLET BY MOUTH DAILY AS DIRECTED  90 tablet  2  . HYDROcodone-acetaminophen (VICODIN) 5-500 MG per tablet Take 1 tablet by mouth every 4 (four) hours as needed. For pain  100 tablet  5  . hydrocortisone (ANUSOL-HC) 25 MG suppository Place rectally. As needed for bleeding       .  hyoscyamine (LEVBID) 0.375 MG 12 hr tablet Take 0.375 mg by mouth 2 (two) times daily as needed. As needed for abdominal pain       . insulin aspart protamine-insulin aspart (NOVOLOG 70/30) (70-30) 100 UNIT/ML injection Inject 70 units SQ daily with breakfast and inject 60 units SQ daily with evening meal  10 mL  10  . losartan (COZAAR) 25 MG tablet Take 1 tablet (25 mg total) by mouth daily.  30 tablet  11  . omeprazole (PRILOSEC) 20 MG capsule Take 1 capsule (20 mg total) by mouth daily.  30 capsule  5  . rosuvastatin (CRESTOR) 10 MG tablet Take 1 tablet (10 mg total) by mouth daily.  30 tablet  11  . mometasone-formoterol (DULERA) 100-5 MCG/ACT AERO Inhale 2 puffs into the lungs 2 (two) times daily.  1 Inhaler  prn   No current facility-administered medications on file prior to visit.    Allergies  Allergen Reactions  . Aspirin     Family History  Problem Relation Age of Onset  . Cancer Father     Prostate Cancer  . Cancer Sister     Breast Cancer  . Diabetes Sister   . Diabetes Sister     BP 134/70  Pulse 80  Wt 165 lb (74.844 kg)  BMI 31.19 kg/m2  SpO2 97%   Review of Systems Denies hematuria and brbpr    Objective:   Physical Exam VITAL SIGNS:  See vs page GENERAL: no distress ABDOMEN: abdomen is soft, nontender.  The left flank is also nontender.  no hepatosplenomegaly.  not distended.  no hernia  Lab Results  Component Value Date   HGBA1C 7.3* 10/16/2012      Assessment & Plan:  DM: this is the best control this pt should aim for, given this regimen, which does match insulin to her changing needs throughout the day Flank pain, new, uncertain etiology

## 2012-10-16 NOTE — Patient Instructions (Addendum)
X-rays, a diabetes blood test, and a urine test are being requested for you today.  We'll contact you with results. Try taking miralax 3 times a day, for a few days, to see if this helps your symptoms.   I hope you feel better soon.  If you don't feel better by next week, please call back.  Please call sooner if you get worse, or if you develop a rash on your left side.  Please come back for a follow-up appointment in 3 months.

## 2012-11-12 DIAGNOSIS — N390 Urinary tract infection, site not specified: Secondary | ICD-10-CM | POA: Diagnosis not present

## 2012-11-26 ENCOUNTER — Telehealth: Payer: Self-pay | Admitting: Endocrinology

## 2012-11-26 NOTE — Telephone Encounter (Signed)
Needs something other than Crestor>>> too expensive.

## 2012-11-27 MED ORDER — ATORVASTATIN CALCIUM 20 MG PO TABS: 20.0000 mg | ORAL_TABLET | Freq: Every day | ORAL | Status: DC

## 2012-11-27 NOTE — Telephone Encounter (Signed)
i changed and sent rx 

## 2012-12-21 DIAGNOSIS — L253 Unspecified contact dermatitis due to other chemical products: Secondary | ICD-10-CM | POA: Diagnosis not present

## 2013-01-04 DIAGNOSIS — E785 Hyperlipidemia, unspecified: Secondary | ICD-10-CM | POA: Diagnosis not present

## 2013-01-04 DIAGNOSIS — I10 Essential (primary) hypertension: Secondary | ICD-10-CM | POA: Diagnosis not present

## 2013-01-04 DIAGNOSIS — F411 Generalized anxiety disorder: Secondary | ICD-10-CM | POA: Diagnosis not present

## 2013-02-01 ENCOUNTER — Other Ambulatory Visit: Payer: Self-pay | Admitting: *Deleted

## 2013-02-01 MED ORDER — LOSARTAN POTASSIUM 25 MG PO TABS: 25.0000 mg | ORAL_TABLET | Freq: Every day | ORAL | Status: DC

## 2013-02-01 MED ORDER — ATORVASTATIN CALCIUM 20 MG PO TABS: 20.0000 mg | ORAL_TABLET | Freq: Every day | ORAL | Status: DC

## 2013-02-01 MED ORDER — INSULIN ASPART PROT & ASPART (70-30 MIX) 100 UNIT/ML PEN: 130.0000 [IU] | PEN_INJECTOR | SUBCUTANEOUS | Status: DC

## 2013-02-01 MED ORDER — INSULIN PEN NEEDLE 31G X 8 MM MISC: Status: DC

## 2013-02-01 MED ORDER — FUROSEMIDE 20 MG PO TABS: ORAL_TABLET | ORAL | Status: DC

## 2013-02-01 NOTE — Telephone Encounter (Signed)
Per provider, Rx[s] sent vis [facility]

## 2013-02-01 NOTE — Telephone Encounter (Signed)
Received paperwork requesting approval for patient medication from new pharmacy, requested per patient verification, d/t cost [new flexpin (vs vial) and pen needle request], paperwork forwarded to provider for completion; medication entered as 'no print', will be faxed afterward/[facility]

## 2013-03-22 DIAGNOSIS — M549 Dorsalgia, unspecified: Secondary | ICD-10-CM | POA: Diagnosis not present

## 2013-03-22 DIAGNOSIS — R300 Dysuria: Secondary | ICD-10-CM | POA: Diagnosis not present

## 2013-04-05 ENCOUNTER — Encounter: Payer: Self-pay | Admitting: Endocrinology

## 2013-04-05 ENCOUNTER — Ambulatory Visit (INDEPENDENT_AMBULATORY_CARE_PROVIDER_SITE_OTHER): Payer: Medicare Other | Admitting: Endocrinology

## 2013-04-05 VITALS — BP 132/74 | HR 74 | Wt 166.0 lb

## 2013-04-05 DIAGNOSIS — G609 Hereditary and idiopathic neuropathy, unspecified: Secondary | ICD-10-CM

## 2013-04-05 DIAGNOSIS — E109 Type 1 diabetes mellitus without complications: Secondary | ICD-10-CM

## 2013-04-05 DIAGNOSIS — IMO0001 Reserved for inherently not codable concepts without codable children: Secondary | ICD-10-CM

## 2013-04-05 MED ORDER — ATORVASTATIN CALCIUM 10 MG PO TABS: 10.0000 mg | ORAL_TABLET | Freq: Every day | ORAL | Status: DC

## 2013-04-05 NOTE — Progress Notes (Signed)
Subjective:    Patient ID: Tammy Hughes, female    DOB: 08/28/1932, 79 y.o.   MRN: [id number]  HPI Pt returns for f/u of insulin-requiring DM (dx'ed 1990; complicated by peripheral sensory neuropathy). no cbg record, but states cbg's are well-controlled.  She has a few mos of slight pain at the thighs and legs, and assoc fatigue.  She stopped lipitor, and felt better.   Past Medical History  Diagnosis Date  . DIABETES MELLITUS, TYPE I 02/23/2007  . HYPERCHOLESTEROLEMIA 02/01/2008  . ANXIETY 03/08/2008  . PERIPHERAL NEUROPATHY 02/23/2007  . HYPERTENSION 03/08/2008  . GERD 02/23/2007  . DIVERTICULOSIS, COLON 03/08/2008  . RECTAL BLEEDING 03/21/2010  . OSTEOARTHRITIS 02/23/2007  . OSTEOPOROSIS 02/23/2007  . Urolithiasis     Past Surgical History  Procedure Laterality Date  . Cholecystectomy  1995  . Esophagogastroduodenoscopy  06/08/2002    History   Social History  . Marital Status: Married    Spouse Name: N/A    Number of Children: N/A  . Years of Education: N/A   Occupational History  . Retired    Social History Main Topics  . Smoking status: Never Smoker   . Smokeless tobacco: Not on file  . Alcohol Use: No  . Drug Use:   . Sexual Activity:    Other Topics Concern  . Not on file   Social History Narrative   Sales promotion account executive          Current Outpatient Prescriptions on File Prior to Visit  Medication Sig Dispense Refill  . Calcium Carbonate-Vit D-Min (CALCIUM 600 + MINERALS) 600-200 MG-UNIT TABS Take 1 tablet by mouth daily.        . furosemide (LASIX) 20 MG tablet TAKE 1 TABLET BY MOUTH DAILY AS DIRECTED  90 tablet  prn  . HYDROcodone-acetaminophen (VICODIN) 5-500 MG per tablet Take 1 tablet by mouth every 4 (four) hours as needed. For pain  100 tablet  5  . hydrocortisone (ANUSOL-HC) 25 MG suppository Place rectally. As needed for bleeding       . hyoscyamine (LEVBID) 0.375 MG 12 hr tablet Take 0.375 mg by mouth 2 (two) times daily as needed. As needed  for abdominal pain       . Insulin Aspart Prot & Aspart (NOVOLOG MIX 70/30 FLEXPEN) (70-30) 100 UNIT/ML SUPN Inject 130 Units into the skin as directed. INJECT 70 UNITS SQ DAILY WITH BREAKFAST AND INJECT 60 UNITS SQ DAILY WITH EVENING MEAL  40 pen  prn  . insulin aspart protamine-insulin aspart (NOVOLOG 70/30) (70-30) 100 UNIT/ML injection Inject 70 units SQ daily with breakfast and inject 60 units SQ daily with evening meal  10 mL  10  . Insulin Pen Needle 31G X 8 MM MISC USE AS DIRECTED WITH INSULIN TWICE DAILY Dx: 250.01  200 each  prn  . losartan (COZAAR) 25 MG tablet Take 1 tablet (25 mg total) by mouth daily.  90 tablet  prn  . omeprazole (PRILOSEC) 20 MG capsule Take 1 capsule (20 mg total) by mouth daily.  30 capsule  5  . mometasone-formoterol (DULERA) 100-5 MCG/ACT AERO Inhale 2 puffs into the lungs 2 (two) times daily.  1 Inhaler  prn   No current facility-administered medications on file prior to visit.    Allergies  Allergen Reactions  . Aspirin     Family History  Problem Relation Age of Onset  . Cancer Father     Prostate Cancer  . Cancer Sister     Breast Cancer  .  Diabetes Sister   . Diabetes Sister     BP 132/74  Pulse 74  Wt 166 lb (75.297 kg)  BMI 31.38 kg/m2  SpO2 94%  Review of Systems denies hypoglycemia and weight change.    Objective:   Physical Exam VITAL SIGNS:  See vs page GENERAL: no distress Thighs: nontender.    Lab Results  Component Value Date   HGBA1C 7.6* 04/05/2013      Assessment & Plan:  DM: This insulin regimen was chosen from multiple options, for its simplicity.  The benefits of glycemic control must be weighed against the risks of hypoglycemia.  this is the best control this pt should aim for, given this regimen, which does match insulin to her changing needs throughout the day Dyslipidemia: therapy is limited by perceived drug intolerance Myalgias, new, uncertain etiology

## 2013-04-05 NOTE — Patient Instructions (Addendum)
check your blood sugar twice a day.  vary the time of day when you check, between before the 3 meals, and at bedtime.  also check if you have symptoms of your blood sugar being too high or too low.  please keep a record of the readings and bring it to your next appointment here.  please call us sooner if your blood sugar goes below 70, or if you have a lot of readings over 200.  Please come back for a "medicare wellness" appointment after 07/28/13.   blood tests are being requested for you today.  We'll contact you with results.   Please re-try the lipitor at just 10 mg daily.  i have sent a prescription to your pharmacy.

## 2013-04-06 LAB — SEDIMENTATION RATE: Sed Rate: 36 mm/hr — ABNORMAL HIGH (ref 0–22)

## 2013-04-06 LAB — CK: Total CK: 279 U/L — ABNORMAL HIGH (ref 7–177)

## 2013-04-07 ENCOUNTER — Telehealth: Payer: Self-pay

## 2013-04-07 MED ORDER — PRAVASTATIN SODIUM 40 MG PO TABS: 40.0000 mg | ORAL_TABLET | Freq: Every day | ORAL | Status: DC

## 2013-04-07 NOTE — Telephone Encounter (Signed)
Pt left voicemail stating the medication ?10mg , that she got at ov is making her sick causing her to have indigestion, gas and headaches, please advise [phone]

## 2013-04-07 NOTE — Telephone Encounter (Signed)
Please change to pravachol.  i have sent a prescription to your pharmacy.

## 2013-04-08 NOTE — Telephone Encounter (Signed)
PT advised and states an understanding 

## 2013-05-05 ENCOUNTER — Telehealth: Payer: Self-pay | Admitting: Endocrinology

## 2013-05-05 ENCOUNTER — Other Ambulatory Visit: Payer: Self-pay

## 2013-05-05 NOTE — Telephone Encounter (Signed)
Pharmacist advised rx changed to pravasti

## 2013-05-06 DIAGNOSIS — N3090 Cystitis, unspecified without hematuria: Secondary | ICD-10-CM | POA: Diagnosis not present

## 2013-06-07 ENCOUNTER — Telehealth: Payer: Self-pay | Admitting: *Deleted

## 2013-06-07 NOTE — Telephone Encounter (Signed)
i don't know where to get a blank one.

## 2013-06-07 NOTE — Telephone Encounter (Signed)
Pt called, requesting "foster parent forms"....please advise & I'll attempt to take care of it.  Callback number [phone]

## 2013-06-15 ENCOUNTER — Ambulatory Visit (INDEPENDENT_AMBULATORY_CARE_PROVIDER_SITE_OTHER): Payer: Medicare Other | Admitting: Endocrinology

## 2013-06-15 VITALS — BP 118/60 | HR 73 | Temp 98.5°F | Ht 61.0 in | Wt 166.4 lb

## 2013-06-15 DIAGNOSIS — E109 Type 1 diabetes mellitus without complications: Secondary | ICD-10-CM

## 2013-06-15 NOTE — Patient Instructions (Signed)
Please come back for a "medicare wellness" appointment after 07/28/13. Here is your form.

## 2013-06-15 NOTE — Progress Notes (Signed)
Subjective:    Patient ID: Tammy Hughes, female    DOB: 1933/01/31, 79 y.o.   MRN: [id number]  HPI Pt is here to address occupational form.  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 She denies fever/sob/cough/weight loss Past Medical History  Diagnosis Date  . DIABETES MELLITUS, TYPE I 02/23/2007  . HYPERCHOLESTEROLEMIA 02/01/2008  . ANXIETY 03/08/2008  . PERIPHERAL NEUROPATHY 02/23/2007  . HYPERTENSION 03/08/2008  . GERD 02/23/2007  . DIVERTICULOSIS, COLON 03/08/2008  . RECTAL BLEEDING 03/21/2010  . OSTEOARTHRITIS 02/23/2007  . OSTEOPOROSIS 02/23/2007  . Urolithiasis     Past Surgical History  Procedure Laterality Date  . Cholecystectomy  1995  . Esophagogastroduodenoscopy  06/08/2002    History   Social History  . Marital Status: Married    Spouse Name: N/A    Number of Children: N/A  . Years of Education: N/A   Occupational History  . Retired    Social History Main Topics  . Smoking status: Never Smoker   . Smokeless tobacco: Not on file  . Alcohol Use: No  . Drug Use:   . Sexual Activity:    Other Topics Concern  . Not on file   Social History Narrative   Sales promotion account executive          Current Outpatient Prescriptions on File Prior to Visit  Medication Sig Dispense Refill  . Calcium Carbonate-Vit D-Min (CALCIUM 600 + MINERALS) 600-200 MG-UNIT TABS Take 1 tablet by mouth daily.        . furosemide (LASIX) 20 MG tablet TAKE 1 TABLET BY MOUTH DAILY AS DIRECTED  90 tablet  prn  . HYDROcodone-acetaminophen (VICODIN) 5-500 MG per tablet Take 1 tablet by mouth every 4 (four) hours as needed. For pain  100 tablet  5  . hydrocortisone (ANUSOL-HC) 25 MG suppository Place rectally. As needed for bleeding       . hyoscyamine (LEVBID) 0.375 MG 12 hr tablet Take 0.375 mg by mouth 2 (two) times daily as needed. As needed for abdominal pain       . Insulin Aspart Prot & Aspart (NOVOLOG MIX 70/30 FLEXPEN) (70-30) 100 UNIT/ML SUPN Inject 130 Units into the skin as  directed. INJECT 70 UNITS SQ DAILY WITH BREAKFAST AND INJECT 60 UNITS SQ DAILY WITH EVENING MEAL  40 pen  prn  . insulin aspart protamine-insulin aspart (NOVOLOG 70/30) (70-30) 100 UNIT/ML injection Inject 70 units SQ daily with breakfast and inject 60 units SQ daily with evening meal  10 mL  10  . Insulin Pen Needle 31G X 8 MM MISC USE AS DIRECTED WITH INSULIN TWICE DAILY Dx: 250.01  200 each  prn  . losartan (COZAAR) 25 MG tablet Take 1 tablet (25 mg total) by mouth daily.  90 tablet  prn  . omeprazole (PRILOSEC) 20 MG capsule Take 1 capsule (20 mg total) by mouth daily.  30 capsule  5  . pravastatin (PRAVACHOL) 40 MG tablet Take 1 tablet (40 mg total) by mouth daily.  30 tablet  11  . mometasone-formoterol (DULERA) 100-5 MCG/ACT AERO Inhale 2 puffs into the lungs 2 (two) times daily.  1 Inhaler  prn   No current facility-administered medications on file prior to visit.    Allergies  Allergen Reactions  . Aspirin     Family History  Problem Relation Age of Onset  . Cancer Father     Prostate Cancer  . Cancer Sister     Breast Cancer  . Diabetes Sister   .  Diabetes Sister     BP 118/60  Pulse 73  Temp(Src) 98.5 F (36.9 C) (Oral)  Ht 5\' 1"  (1.549 m)  Wt 166 lb 6 oz (75.467 kg)  BMI 31.45 kg/m2  SpO2 97%  Review of Systems Denies n/v/d    Objective:   Physical Exam VITAL SIGNS:  See vs page GENERAL: no distress head: no deformity eyes: no periorbital swelling, no proptosis external nose and ears are normal mouth: no lesion seen LUNGS:  Clear to auscultation.       Assessment & Plan:  Occupational exam.  No contraindication is found.

## 2013-06-29 ENCOUNTER — Ambulatory Visit: Payer: Medicare Other | Admitting: Endocrinology

## 2013-07-28 ENCOUNTER — Ambulatory Visit: Payer: Medicare Other | Admitting: Endocrinology

## 2013-08-06 ENCOUNTER — Encounter: Payer: Self-pay | Admitting: Endocrinology

## 2013-08-06 ENCOUNTER — Ambulatory Visit (INDEPENDENT_AMBULATORY_CARE_PROVIDER_SITE_OTHER): Payer: Medicare Other | Admitting: Endocrinology

## 2013-08-06 ENCOUNTER — Other Ambulatory Visit: Payer: Self-pay | Admitting: Endocrinology

## 2013-08-06 ENCOUNTER — Ambulatory Visit
Admission: RE | Admit: 2013-08-06 | Discharge: 2013-08-06 | Disposition: A | Discharge status: Home / Self Care | Payer: Medicare Other | Source: Ambulatory Visit | Attending: Endocrinology | Admitting: Endocrinology

## 2013-08-06 VITALS — BP 120/53 | HR 75 | Temp 98.0°F | Ht 61.0 in | Wt 167.0 lb

## 2013-08-06 DIAGNOSIS — K7689 Other specified diseases of liver: Secondary | ICD-10-CM | POA: Diagnosis not present

## 2013-08-06 DIAGNOSIS — E7800 Pure hypercholesterolemia, unspecified: Secondary | ICD-10-CM

## 2013-08-06 DIAGNOSIS — R059 Cough, unspecified: Secondary | ICD-10-CM | POA: Insufficient documentation

## 2013-08-06 DIAGNOSIS — J84112 Idiopathic pulmonary fibrosis: Secondary | ICD-10-CM | POA: Insufficient documentation

## 2013-08-06 DIAGNOSIS — Z79899 Other long term (current) drug therapy: Secondary | ICD-10-CM | POA: Diagnosis not present

## 2013-08-06 DIAGNOSIS — R05 Cough: Secondary | ICD-10-CM | POA: Diagnosis not present

## 2013-08-06 DIAGNOSIS — E109 Type 1 diabetes mellitus without complications: Secondary | ICD-10-CM

## 2013-08-06 DIAGNOSIS — M810 Age-related osteoporosis without current pathological fracture: Secondary | ICD-10-CM | POA: Diagnosis not present

## 2013-08-06 DIAGNOSIS — J984 Other disorders of lung: Secondary | ICD-10-CM | POA: Diagnosis not present

## 2013-08-06 DIAGNOSIS — I10 Essential (primary) hypertension: Secondary | ICD-10-CM

## 2013-08-06 DIAGNOSIS — D72819 Decreased white blood cell count, unspecified: Secondary | ICD-10-CM

## 2013-08-06 DIAGNOSIS — J8410 Pulmonary fibrosis, unspecified: Secondary | ICD-10-CM

## 2013-08-06 DIAGNOSIS — K7581 Nonalcoholic steatohepatitis (NASH): Secondary | ICD-10-CM

## 2013-08-06 LAB — LDL CHOLESTEROL, DIRECT: LDL DIRECT: 104.1 mg/dL

## 2013-08-06 LAB — BASIC METABOLIC PANEL
BUN: 17 mg/dL (ref 6–23)
CO2: 27 meq/L (ref 19–32)
Calcium: 9.9 mg/dL (ref 8.4–10.5)
Chloride: 106 mEq/L (ref 96–112)
Creatinine, Ser: 1.1 mg/dL (ref 0.4–1.2)
GFR: 64.42 mL/min (ref >60.00)
Glucose, Bld: 143 mg/dL — ABNORMAL HIGH (ref 70–99)
POTASSIUM: 3.9 meq/L (ref 3.5–5.1)
Sodium: 141 mEq/L (ref 135–145)

## 2013-08-06 LAB — URINALYSIS, ROUTINE W REFLEX MICROSCOPIC
Bilirubin Urine: NEGATIVE
Hgb urine dipstick: NEGATIVE
Ketones, ur: NEGATIVE
NITRITE: NEGATIVE
PH: 5.5 (ref 5.0–8.0)
SPECIFIC GRAVITY, URINE: 1.01 (ref 1.000–1.030)
Total Protein, Urine: NEGATIVE
Urine Glucose: NEGATIVE
Urobilinogen, UA: 0.2 (ref 0.0–1.0)

## 2013-08-06 LAB — LIPID PANEL
CHOL/HDL RATIO: 4
Cholesterol: 164 mg/dL (ref 0–200)
HDL: 36.8 mg/dL — ABNORMAL LOW (ref >39.00)
Triglycerides: 254 mg/dL — ABNORMAL HIGH (ref 0.0–149.0)
VLDL: 50.8 mg/dL — ABNORMAL HIGH (ref 0.0–40.0)

## 2013-08-06 LAB — CBC WITH DIFFERENTIAL/PLATELET
BASOS ABS: 0.1 10*3/uL (ref 0.0–0.1)
Basophils Relative: 2.3 % (ref 0.0–3.0)
Eosinophils Absolute: 0.1 10*3/uL (ref 0.0–0.7)
Eosinophils Relative: 2.9 % (ref 0.0–5.0)
HCT: 40.8 % (ref 36.0–46.0)
HEMOGLOBIN: 13.6 g/dL (ref 12.0–15.0)
LYMPHS PCT: 33.3 % (ref 12.0–46.0)
Lymphs Abs: 1.6 10*3/uL (ref 0.7–4.0)
MCHC: 33.4 g/dL (ref 30.0–36.0)
MCV: 89.3 fl (ref 78.0–100.0)
MONOS PCT: 7.3 % (ref 3.0–12.0)
Monocytes Absolute: 0.3 10*3/uL (ref 0.1–1.0)
Neutro Abs: 2.5 10*3/uL (ref 1.4–7.7)
Neutrophils Relative %: 54.2 % (ref 43.0–77.0)
PLATELETS: 272 10*3/uL (ref 150.0–400.0)
RBC: 4.57 Mil/uL (ref 3.87–5.11)
RDW: 14.1 % (ref 11.5–14.6)
WBC: 4.7 10*3/uL (ref 4.5–10.5)

## 2013-08-06 LAB — MICROALBUMIN / CREATININE URINE RATIO
Creatinine,U: 51.8 mg/dL
MICROALB/CREAT RATIO: 1 mg/g (ref 0.0–30.0)
Microalb, Ur: 0.5 mg/dL (ref 0.0–1.9)

## 2013-08-06 LAB — HEPATIC FUNCTION PANEL
ALT: 27 U/L (ref 0–35)
AST: 27 U/L (ref 0–37)
Albumin: 3.7 g/dL (ref 3.5–5.2)
Alkaline Phosphatase: 74 U/L (ref 39–117)
BILIRUBIN TOTAL: 0.4 mg/dL (ref 0.3–1.2)
Bilirubin, Direct: 0 mg/dL (ref 0.0–0.3)
Total Protein: 7.5 g/dL (ref 6.0–8.3)

## 2013-08-06 LAB — TSH: TSH: 0.45 u[IU]/mL (ref 0.35–5.50)

## 2013-08-06 LAB — HEMOGLOBIN A1C: Hgb A1c MFr Bld: 7.3 % — ABNORMAL HIGH (ref 4.6–6.5)

## 2013-08-06 MED ORDER — ATORVASTATIN CALCIUM 40 MG PO TABS: 40.0000 mg | ORAL_TABLET | Freq: Every day | ORAL | Status: DC

## 2013-08-06 MED ORDER — CEFUROXIME AXETIL 250 MG PO TABS: 250.0000 mg | ORAL_TABLET | Freq: Two times a day (BID) | ORAL | Status: AC

## 2013-08-06 NOTE — Progress Notes (Signed)
Subjective:    Patient ID: Tammy Hughes, female    DOB: [birth year], 80 y.o.   MRN: [id number]  HPI Pt returns for f/u of insulin-requiring DM (dx'ed 1990; she has moderate neuropathy of the lower extremities; no assoc chronic complications; she chose bid premixed insulin). no cbg record, but states cbg's are well-controlled.    Pt states few days of slight prod-quality cough in the chest, and assoc nasal congestion.   Past Medical History  Diagnosis Date  . DIABETES MELLITUS, TYPE I 02/23/2007  . HYPERCHOLESTEROLEMIA 02/01/2008  . ANXIETY 03/08/2008  . PERIPHERAL NEUROPATHY 02/23/2007  . HYPERTENSION 03/08/2008  . GERD 02/23/2007  . DIVERTICULOSIS, COLON 03/08/2008  . RECTAL BLEEDING 03/21/2010  . OSTEOARTHRITIS 02/23/2007  . OSTEOPOROSIS 02/23/2007  . Urolithiasis     Past Surgical History  Procedure Laterality Date  . Cholecystectomy  1995  . Esophagogastroduodenoscopy  06/08/2002    History   Social History  . Marital Status: Married    Spouse Name: N/A    Number of Children: N/A  . Years of Education: N/A   Occupational History  . Retired    Social History Main Topics  . Smoking status: Never Smoker   . Smokeless tobacco: Not on file  . Alcohol Use: No  . Drug Use:   . Sexual Activity:    Other Topics Concern  . Not on file   Social History Narrative   Dentist          Current Outpatient Prescriptions on File Prior to Visit  Medication Sig Dispense Refill  . Calcium Carbonate-Vit D-Min (CALCIUM 600 + MINERALS) 600-200 MG-UNIT TABS Take 1 tablet by mouth daily.        . furosemide (LASIX) 20 MG tablet TAKE 1 TABLET BY MOUTH DAILY AS DIRECTED  90 tablet  prn  . HYDROcodone-acetaminophen (VICODIN) 5-500 MG per tablet Take 1 tablet by mouth every 4 (four) hours as needed. For pain  100 tablet  5  . hydrocortisone (ANUSOL-HC) 25 MG suppository Place rectally. As needed for bleeding       . hyoscyamine (LEVBID) 0.375 MG 12 hr tablet Take 0.375 mg  by mouth 2 (two) times daily as needed. As needed for abdominal pain       . insulin aspart protamine-insulin aspart (NOVOLOG 70/30) (70-30) 100 UNIT/ML injection Inject 70 units SQ daily with breakfast and inject 60 units SQ daily with evening meal  10 mL  10  . Insulin Pen Needle 31G X 8 MM MISC USE AS DIRECTED WITH INSULIN TWICE DAILY Dx: 250.01  200 each  prn  . losartan (COZAAR) 25 MG tablet Take 1 tablet (25 mg total) by mouth daily.  90 tablet  prn  . omeprazole (PRILOSEC) 20 MG capsule Take 1 capsule (20 mg total) by mouth daily.  30 capsule  5  . mometasone-formoterol (DULERA) 100-5 MCG/ACT AERO Inhale 2 puffs into the lungs 2 (two) times daily.  1 Inhaler  prn   No current facility-administered medications on file prior to visit.    Allergies  Allergen Reactions  . Aspirin     Family History  Problem Relation Age of Onset  . Cancer Father     Prostate Cancer  . Cancer Sister     Breast Cancer  . Diabetes Sister   . Diabetes Sister     BP 120/53  Pulse 75  Temp(Src) 98 F (36.7 C) (Oral)  Ht 5\' 1"  (1.549 m)  Wt 167 lb (75.751 kg)  BMI 31.57 kg/m2  SpO2 90%  Review of Systems denies hypoglycemia.  She has left otalgia, but no fever.      Objective:   Physical Exam VITAL SIGNS:  See vs page GENERAL: no distress head: no deformity eyes: no periorbital swelling, no proptosis external nose and ears are normal mouth: no lesion seen Both eac's and tm's are normal. LUNGS:  Clear to auscultation, except for rales at the left base.     CXR: fibrosis Lab Results  Component Value Date   HGBA1C 7.3* 08/06/2013   Lab Results  Component Value Date   CHOL 164 08/06/2013   HDL 36.80* 08/06/2013   LDLCALC 63 07/28/2012   LDLDIRECT 104.1 08/06/2013   TRIG 254.0* 08/06/2013   CHOLHDL 4 08/06/2013      Assessment & Plan:  DM: This insulin regimen was chosen from multiple options, for its simplicity.  The benefits of glycemic control must be weighed against the risks of  hypoglycemia.  this is the best control this pt should aim for, given this regimen, which does match insulin to her changing needs throughout the day pulm fibrosis, new, uncertain etiology. Acute bronchitis, new Dyslipidemia: she needs increased rx

## 2013-08-06 NOTE — Patient Instructions (Addendum)
check your blood sugar twice a day.  vary the time of day when you check, between before the 3 meals, and at bedtime.  also check if you have symptoms of your blood sugar being too high or too low.  please keep a record of the readings and bring it to your next appointment here.  please call us sooner if your blood sugar goes below 70, or if you have a lot of readings over 200.   Please come back for a "medicare wellness" appointment in 3 months.   blood tests are being requested for you today.  We'll contact you with results.   Let's check a chest-x-ray today, on the first floor.   i have sent a prescription to your pharmacy, for an antibiotic pill.

## 2013-08-09 LAB — PTH, INTACT AND CALCIUM
Calcium: 10.1 mg/dL (ref 8.4–10.5)
PTH: 61.9 pg/mL (ref 14.0–72.0)

## 2013-08-13 ENCOUNTER — Institutional Professional Consult (permissible substitution): Payer: Medicare Other | Admitting: Critical Care Medicine

## 2013-08-23 ENCOUNTER — Ambulatory Visit (INDEPENDENT_AMBULATORY_CARE_PROVIDER_SITE_OTHER): Payer: Medicare Other

## 2013-08-23 ENCOUNTER — Encounter: Payer: Self-pay | Admitting: Critical Care Medicine

## 2013-08-23 ENCOUNTER — Ambulatory Visit (INDEPENDENT_AMBULATORY_CARE_PROVIDER_SITE_OTHER): Payer: Medicare Other | Admitting: Critical Care Medicine

## 2013-08-23 VITALS — BP 128/76 | HR 72 | Temp 97.9°F | Ht 61.0 in | Wt 170.6 lb

## 2013-08-23 DIAGNOSIS — J8410 Pulmonary fibrosis, unspecified: Secondary | ICD-10-CM | POA: Diagnosis not present

## 2013-08-23 LAB — SEDIMENTATION RATE: Sed Rate: 26 mm/hr — ABNORMAL HIGH (ref 0–22)

## 2013-08-23 MED ORDER — OMEPRAZOLE 20 MG PO CPDR: 20.0000 mg | DELAYED_RELEASE_CAPSULE | Freq: Two times a day (BID) | ORAL | Status: DC

## 2013-08-23 NOTE — Progress Notes (Signed)
Subjective:    Patient ID: Tammy Hughes, female    DOB: [birth year], 80 y.o.   MRN: [id number]  Cough This is a new problem. The current episode started 1 to 4 weeks ago. The problem has been gradually improving. The problem occurs every few hours (dry cough). The cough is non-productive. Associated symptoms include heartburn and a sore throat. Pertinent negatives include no chest pain, chills, ear congestion, ear pain, eye redness, fever, headaches, hemoptysis, myalgias, nasal congestion, postnasal drip, rash, rhinorrhea, shortness of breath, sweats, weight loss or wheezing. The symptoms are aggravated by lying down (worse after eating). Risk factors for lung disease include smoking/tobacco exposure (passive smoke exposure only). Treatments tried: Abx helped. The treatment provided moderate relief. There is no history of asthma, bronchiectasis, bronchitis, COPD, emphysema, environmental allergies or pneumonia.     Past Medical History  Diagnosis Date  . DIABETES MELLITUS, TYPE I 02/23/2007  . HYPERCHOLESTEROLEMIA 02/01/2008  . ANXIETY 03/08/2008  . PERIPHERAL NEUROPATHY 02/23/2007  . HYPERTENSION 03/08/2008  . GERD 02/23/2007  . DIVERTICULOSIS, COLON 03/08/2008  . RECTAL BLEEDING 03/21/2010  . OSTEOARTHRITIS 02/23/2007  . OSTEOPOROSIS 02/23/2007  . Urolithiasis      Family History  Problem Relation Age of Onset  . Cancer Father     Prostate Cancer  . Cancer Sister     Breast Cancer  . Diabetes Sister   . Diabetes Sister      History   Social History  . Marital Status: Married    Spouse Name: N/A    Number of Children: N/A  . Years of Education: N/A   Occupational History  . Retired    Social History Main Topics  . Smoking status: Never Smoker   . Smokeless tobacco: Not on file  . Alcohol Use: No  . Drug Use:   . Sexual Activity:    Other Topics Concern  . Not on file   Social History Narrative   Dentist           Allergies  Allergen Reactions   . Aspirin      Outpatient Prescriptions Prior to Visit  Medication Sig Dispense Refill  . atorvastatin (LIPITOR) 40 MG tablet Take 1 tablet (40 mg total) by mouth daily.  30 tablet  11  . Calcium Carbonate-Vit D-Min (CALCIUM 600 + MINERALS) 600-200 MG-UNIT TABS Take 1 tablet by mouth daily.        . furosemide (LASIX) 20 MG tablet TAKE 1 TABLET BY MOUTH DAILY AS DIRECTED  90 tablet  prn  . HYDROcodone-acetaminophen (VICODIN) 5-500 MG per tablet Take 1 tablet by mouth every 4 (four) hours as needed. For pain  100 tablet  5  . hydrocortisone (ANUSOL-HC) 25 MG suppository Place rectally. As needed for bleeding       . insulin aspart protamine-insulin aspart (NOVOLOG 70/30) (70-30) 100 UNIT/ML injection Inject 70 units SQ daily with breakfast and inject 60 units SQ daily with evening meal  10 mL  10  . Insulin Pen Needle 31G X 8 MM MISC USE AS DIRECTED WITH INSULIN TWICE DAILY Dx: 250.01  200 each  prn  . losartan (COZAAR) 25 MG tablet Take 1 tablet (25 mg total) by mouth daily.  90 tablet  prn  . NOVOLOG MIX 70/30 FLEXPEN (70-30) 100 UNIT/ML Pen INJECT 70 UNITS SQ DAILY WITH BREAKFAST AND 60 UNITS SQ DAILY WITH EVENING MEAL AS DIRECTED  45 mL  1  . omeprazole (PRILOSEC) 20 MG capsule Take 1 capsule (20  mg total) by mouth daily.  30 capsule  5  . hyoscyamine (LEVBID) 0.375 MG 12 hr tablet Take 0.375 mg by mouth 2 (two) times daily as needed. As needed for abdominal pain       . mometasone-formoterol (DULERA) 100-5 MCG/ACT AERO Inhale 2 puffs into the lungs 2 (two) times daily.  1 Inhaler  prn   No facility-administered medications prior to visit.      Review of Systems  Constitutional: Negative for fever, chills, weight loss and unexpected weight change.  HENT: Positive for sore throat. Negative for congestion, dental problem, ear pain, nosebleeds, postnasal drip, rhinorrhea, sinus pressure, sneezing and trouble swallowing.   Eyes: Negative for redness and itching.  Respiratory: Positive for  cough. Negative for hemoptysis, chest tightness, shortness of breath and wheezing.   Cardiovascular: Positive for leg swelling. Negative for chest pain and palpitations.       Hand and feet  Gastrointestinal: Positive for heartburn and abdominal distention. Negative for nausea and vomiting.       Heartburn and indigestion  Genitourinary: Negative for dysuria.  Musculoskeletal: Negative for myalgias.  Skin: Negative for rash.  Allergic/Immunologic: Negative for environmental allergies.  Neurological: Negative for headaches.  Hematological: Does not bruise/bleed easily.  Psychiatric/Behavioral: Negative for dysphoric mood. The patient is not nervous/anxious.        Objective:   Physical Exam Filed Vitals:   08/23/13 1627  BP: 128/76  Pulse: 72  Temp: 97.9 F (36.6 C)  TempSrc: Oral  Height: 5\' 1"  (1.549 m)  Weight: 170 lb 9.6 oz (77.384 kg)  SpO2: 96%    Gen: Pleasant, well-nourished, in no distress,  normal affect  ENT: No lesions,  mouth clear,  oropharynx clear, no postnasal drip  Neck: No JVD, no TMG, no carotid bruits  Lungs: No use of accessory muscles, no dullness to percussion, dry rales bibasilar  Cardiovascular: RRR, heart sounds normal, no murmur or gallops, no peripheral edema  Abdomen: soft and NT, no HSM,  BS normal  Musculoskeletal: No deformities, no cyanosis or clubbing  Neuro: alert, non focal  Skin: Warm, no lesions or rashes  No results found.        Assessment & Plan:   Postinflammatory pulmonary fibrosis Progressive dyspnea and CXR findings  C/w ILD.  No overt causes except GERD identified Plan Serology CT Chest May be good candidate for Esbriet Reflux diet and PPI No oxygen needed    Updated Medication List Outpatient Encounter Prescriptions as of 08/23/2013  Medication Sig  . atorvastatin (LIPITOR) 40 MG tablet Take 1 tablet (40 mg total) by mouth daily.  . Calcium Carbonate-Vit D-Min (CALCIUM 600 + MINERALS) 600-200 MG-UNIT  TABS Take 1 tablet by mouth daily.    . furosemide (LASIX) 20 MG tablet TAKE 1 TABLET BY MOUTH DAILY AS DIRECTED  . HYDROcodone-acetaminophen (VICODIN) 5-500 MG per tablet Take 1 tablet by mouth every 4 (four) hours as needed. For pain  . hydrocortisone (ANUSOL-HC) 25 MG suppository Place rectally. As needed for bleeding   . insulin aspart protamine-insulin aspart (NOVOLOG 70/30) (70-30) 100 UNIT/ML injection Inject 70 units SQ daily with breakfast and inject 60 units SQ daily with evening meal  . Insulin Pen Needle 31G X 8 MM MISC USE AS DIRECTED WITH INSULIN TWICE DAILY Dx: 250.01  . losartan (COZAAR) 25 MG tablet Take 1 tablet (25 mg total) by mouth daily.  Marland Kitchen NOVOLOG MIX 70/30 FLEXPEN (70-30) 100 UNIT/ML Pen INJECT 70 UNITS SQ DAILY WITH BREAKFAST AND 60  UNITS SQ DAILY WITH EVENING MEAL AS DIRECTED  . omeprazole (PRILOSEC) 20 MG capsule Take 1 capsule (20 mg total) by mouth 2 (two) times daily before a meal.  . [DISCONTINUED] omeprazole (PRILOSEC) 20 MG capsule Take 1 capsule (20 mg total) by mouth daily.  . hyoscyamine (LEVBID) 0.375 MG 12 hr tablet Take 0.375 mg by mouth 2 (two) times daily as needed. As needed for abdominal pain   . [DISCONTINUED] mometasone-formoterol (DULERA) 100-5 MCG/ACT AERO Inhale 2 puffs into the lungs 2 (two) times daily.

## 2013-08-23 NOTE — Patient Instructions (Addendum)
A CT chest will be obtained Pulmonary function studies will be obtained Increase omeprazole to one twice daily before meals Labs today Follow reflux diet Return 2 months, I will call with test results

## 2013-08-24 LAB — ANTI-NUCLEAR AB-TITER (ANA TITER): ANA Titer 1: 1:40 {titer} — ABNORMAL HIGH

## 2013-08-24 LAB — RHEUMATOID FACTOR: Rhuematoid fact SerPl-aCnc: <10 IU/mL (ref <=14)

## 2013-08-24 LAB — ANA: ANA: POSITIVE — AB

## 2013-08-25 NOTE — Assessment & Plan Note (Signed)
Progressive dyspnea and CXR findings  C/w ILD.  No overt causes except GERD identified Plan Serology CT Chest May be good candidate for Esbriet Reflux diet and PPI No oxygen needed

## 2013-08-25 NOTE — Progress Notes (Signed)
Quick Note:  Called, spoke with pt. Informed her of lab results per PW. She verbalized understanding and voiced no further questions or concerns at this time. ______

## 2013-09-01 ENCOUNTER — Other Ambulatory Visit: Payer: Medicare Other

## 2013-09-07 ENCOUNTER — Other Ambulatory Visit: Payer: Medicare Other

## 2013-09-13 ENCOUNTER — Ambulatory Visit (INDEPENDENT_AMBULATORY_CARE_PROVIDER_SITE_OTHER)
Admission: RE | Admit: 2013-09-13 | Discharge: 2013-09-13 | Disposition: A | Discharge status: Home / Self Care | Payer: Medicare Other | Source: Ambulatory Visit | Attending: Critical Care Medicine | Admitting: Critical Care Medicine

## 2013-09-13 DIAGNOSIS — J984 Other disorders of lung: Secondary | ICD-10-CM | POA: Diagnosis not present

## 2013-09-13 DIAGNOSIS — J8410 Pulmonary fibrosis, unspecified: Secondary | ICD-10-CM

## 2013-09-15 NOTE — Progress Notes (Signed)
Quick Note:  Called, spoke with pt. Informed her of CT results and recs per PW. She verbalized understanding and is aware of pending PFT on March 19. ______

## 2013-09-30 ENCOUNTER — Ambulatory Visit (INDEPENDENT_AMBULATORY_CARE_PROVIDER_SITE_OTHER): Payer: Medicare Other | Admitting: Critical Care Medicine

## 2013-09-30 DIAGNOSIS — J8410 Pulmonary fibrosis, unspecified: Secondary | ICD-10-CM | POA: Diagnosis not present

## 2013-09-30 NOTE — Progress Notes (Signed)
PFT done today. 

## 2013-10-04 LAB — PULMONARY FUNCTION TEST
DL/VA % PRED: 133 %
DL/VA: 5.85 ml/min/mmHg/L
DLCO unc % pred: 72 %
DLCO unc: 14.73 ml/min/mmHg
FEF 25-75 PRE: 1.66 L/s
FEF 25-75 Post: 0.59 L/sec
FEF2575-%CHANGE-POST: -64 %
FEF2575-%PRED-PRE: 143 %
FEF2575-%Pred-Post: 51 %
FEV1-%CHANGE-POST: -14 %
FEV1-%Pred-Post: 86 %
FEV1-%Pred-Pre: 101 %
FEV1-PRE: 1.36 L
FEV1-Post: 1.16 L
FEV1FVC-%Change-Post: -3 %
FEV1FVC-%PRED-PRE: 111 %
FEV6-%Change-Post: -11 %
FEV6-%Pred-Post: 85 %
FEV6-%Pred-Pre: 96 %
FEV6-POST: 1.42 L
FEV6-Pre: 1.6 L
FEV6FVC-%PRED-PRE: 105 %
FEV6FVC-%Pred-Post: 105 %
FVC-%CHANGE-POST: -11 %
FVC-%Pred-Post: 80 %
FVC-%Pred-Pre: 91 %
FVC-POST: 1.42 L
FVC-Pre: 1.6 L
PRE FEV1/FVC RATIO: 85 %
Post FEV1/FVC ratio: 82 %
Post FEV6/FVC ratio: 100 %
Pre FEV6/FVC Ratio: 100 %
RV % pred: 124 %
RV: 2.75 L
TLC % PRED: 93 %
TLC: 4.31 L

## 2013-10-07 ENCOUNTER — Telehealth: Payer: Self-pay | Admitting: Critical Care Medicine

## 2013-10-07 ENCOUNTER — Other Ambulatory Visit: Payer: Self-pay

## 2013-10-07 NOTE — Progress Notes (Signed)
Quick Note:  Called, spoke with pt. Informed her of PFT results and recs per Dr. Joya Gaskins. She verbalized understanding of this. She would like to come to [place] office to sign esbriet forms. States she will be in [place] on Tuesday and would like to come then. ______

## 2013-10-07 NOTE — Telephone Encounter (Signed)
Result Notes    Notes Recorded by Elsie Stain, MD on 10/04/2013 at 2:41 PM Call pt and tell her PFTs show mild scarring in lung. She is a good pirfenidone candidate. pls start process   ------  Called, spoke with pt.  Informed her of PFT results and recs per Dr. Joya Gaskins.  Pt is in agreement with starting the esbriet process.  She would like to come to [place] office to sign the forms and is requesting to come on Tuesday as she will already be in [place].  Advised I will not be here on Tuesday as it is a travel day, but will leave forms with Jenn to have pt sign when she comes in.  She verbalized understanding and voiced no further questions or concerns at this time.    Jenn, pt states she plans to come in around 1pm on Tuesday.  Thank you for helping with this.

## 2013-10-13 ENCOUNTER — Ambulatory Visit (INDEPENDENT_AMBULATORY_CARE_PROVIDER_SITE_OTHER): Payer: Medicare Other | Admitting: Endocrinology

## 2013-10-13 ENCOUNTER — Encounter: Payer: Self-pay | Admitting: Endocrinology

## 2013-10-13 VITALS — BP 92/50 | HR 70 | Temp 97.3°F | Ht 61.0 in | Wt 168.0 lb

## 2013-10-13 DIAGNOSIS — E109 Type 1 diabetes mellitus without complications: Secondary | ICD-10-CM

## 2013-10-13 MED ORDER — INSULIN ASPART PROT & ASPART (70-30 MIX) 100 UNIT/ML PEN: PEN_INJECTOR | SUBCUTANEOUS | Status: DC

## 2013-10-13 MED ORDER — HYDROCODONE-ACETAMINOPHEN 5-500 MG PO TABS: 1.0000 | ORAL_TABLET | ORAL | Status: DC | PRN

## 2013-10-13 NOTE — Telephone Encounter (Signed)
Called, spoke with pt. Explained [facility] should be contacting her to go through the process to start [place].  She is aware to call office back if she doesn't hear anything in the next 1-2 wks.    Pt requesting to schedule 2 month follow up from last OV with PW on Aug 23, 2013.  She would like [place] office.  We have scheduled this for April 14 at 4:30 pm.  Pt aware and voiced no further questions or concerns at this time.

## 2013-10-13 NOTE — Telephone Encounter (Signed)
Pt is returning Crystal's call.

## 2013-10-13 NOTE — Patient Instructions (Addendum)
Here is a refill of the pain pill. Please stop taking the losartan pill.   Call if you have dizziness.   Please come back for a "medicare wellness" appointment in 1 month. Try taking just 1/2 of the lipitor pill.

## 2013-10-13 NOTE — Telephone Encounter (Signed)
Pt came to office yesterday and completed her part of the forms for esbriet. Dr. Joya Gaskins signed forms this morning. I have faxed this back to [facility] and placed forms in [facility]'s office. [email or web address] to ensure pt is aware she should now be contacted by [facility].

## 2013-10-13 NOTE — Progress Notes (Signed)
   Subjective:    Patient ID: Tammy Hughes, female    DOB: December 21, 1932, 80 y.o.   MRN: [id number]  HPI Pt returns for f/u of insulin-requiring DM (dx'ed 1990; she has moderate neuropathy of the lower extremities; no assoc chronic complications; she has been on insulin since 2005; she chose bid premixed insulin). no cbg record, but states cbg's are well-controlled.  She denies hypoglycemia.  Pt reports headache, but no dizziness or LOC.   Past Medical History  Diagnosis Date  . DIABETES MELLITUS, TYPE I 02/23/2007  . HYPERCHOLESTEROLEMIA 02/01/2008  . ANXIETY 03/08/2008  . PERIPHERAL NEUROPATHY 02/23/2007  . HYPERTENSION 03/08/2008  . GERD 02/23/2007  . DIVERTICULOSIS, COLON 03/08/2008  . RECTAL BLEEDING 03/21/2010  . OSTEOARTHRITIS 02/23/2007  . OSTEOPOROSIS 02/23/2007  . Urolithiasis     Past Surgical History  Procedure Laterality Date  . Cholecystectomy  1995  . Esophagogastroduodenoscopy  06/08/2002    History   Social History  . Marital Status: Married    Spouse Name: N/A    Number of Children: N/A  . Years of Education: N/A   Occupational History  . Retired    Social History Main Topics  . Smoking status: Never Smoker   . Smokeless tobacco: Not on file  . Alcohol Use: No  . Drug Use:   . Sexual Activity:    Other Topics Concern  . Not on file   Social History Narrative   Dentist          Current Outpatient Prescriptions on File Prior to Visit  Medication Sig Dispense Refill  . Calcium Carbonate-Vit D-Min (CALCIUM 600 + MINERALS) 600-200 MG-UNIT TABS Take 1 tablet by mouth daily.        . furosemide (LASIX) 20 MG tablet TAKE 1 TABLET BY MOUTH DAILY AS DIRECTED  90 tablet  prn  . hydrocortisone (ANUSOL-HC) 25 MG suppository Place rectally. As needed for bleeding       . hyoscyamine (LEVBID) 0.375 MG 12 hr tablet Take 0.375 mg by mouth 2 (two) times daily as needed. As needed for abdominal pain       . Insulin Pen Needle 31G X 8 MM MISC USE AS  DIRECTED WITH INSULIN TWICE DAILY Dx: 250.01  200 each  prn  . omeprazole (PRILOSEC) 20 MG capsule Take 1 capsule (20 mg total) by mouth 2 (two) times daily before a meal.  60 capsule  5   No current facility-administered medications on file prior to visit.    Allergies  Allergen Reactions  . Aspirin     Family History  Problem Relation Age of Onset  . Cancer Father     Prostate Cancer  . Cancer Sister     Breast Cancer  . Diabetes Sister   . Diabetes Sister     BP 92/50  Pulse 70  Temp(Src) 97.3 F (36.3 C) (Oral)  Ht 5\' 1"  (1.549 m)  Wt 168 lb (76.204 kg)  BMI 31.76 kg/m2  SpO2 90%  Review of Systems Pt states few mos of pain at the thighs, but no assoc numbness.  She has low-back pain (not recently taking the vicodin)    Objective:   Physical Exam VITAL SIGNS:  See vs page GENERAL: no distress.   (i reviewed 2011 MRI result)    Assessment & Plan:  Low-back pain: recurrent DM: apparently well-controlled HTN: overcontrolled: no clinical reason for this is evident Myalgias: unlikely due to lipitor.

## 2013-10-14 ENCOUNTER — Telehealth: Payer: Self-pay | Admitting: Critical Care Medicine

## 2013-10-14 NOTE — Telephone Encounter (Signed)
Called and spoke with [facility].  They stated that the pt assistance program has changed and they will now give the pt a 30 day supply instead of the 45 day supply.  They wanted to make PW aware.  The pt has already been taken care of.

## 2013-10-21 ENCOUNTER — Telehealth: Payer: Self-pay | Admitting: Critical Care Medicine

## 2013-10-21 NOTE — Telephone Encounter (Signed)
Spoke with the pt  She states she cant really tell if it goes away  She will try taking 1 cap bid and call to let us know how this goes  Nothing further needed per pt

## 2013-10-21 NOTE — Telephone Encounter (Signed)
This is not a known side effect of esbriet  Does this go away when stopping esbriet?  Try again 1 capsule bid and see if recurs

## 2013-10-21 NOTE — Telephone Encounter (Signed)
I spoke with the pt and she states she started esbriet yesterday. She took 3 caps three times yesterday. She states this morning when she woke up her lips "felt funny."  She states they felt kind of numb. She denies any swelling of the lips, no numbness or swelling of her tongue or throat. She has not taken any esbriet today. Please advise. .  Pt also needed location info for [place] office so I provided this info for the pt.   Allergies  Allergen Reactions  . Aspirin

## 2013-10-22 ENCOUNTER — Telehealth: Payer: Self-pay | Admitting: Critical Care Medicine

## 2013-10-22 MED ORDER — DIPHENOXYLATE-ATROPINE 2.5-0.025 MG PO TABS: 1.0000 | ORAL_TABLET | Freq: Four times a day (QID) | ORAL | Status: DC | PRN

## 2013-10-22 NOTE — Telephone Encounter (Signed)
Called spoke with pt. She reports she started the esbriet on Wednesday. Wed she took 3 tablets, yesterday she took 2 tabs d/t symptoms and today only took 1 tablet. She c/o diarrhea, shaky feeling, feels nervous, HA, nausea. She is not taking anything for her symptoms and she is a diabetic. Please advise PW thanks  Allergies  Allergen Reactions  . Aspirin

## 2013-10-22 NOTE — Telephone Encounter (Signed)
Pt aware of recs. rx called in. Nothing further needed 

## 2013-10-22 NOTE — Telephone Encounter (Signed)
The stomach symptoms are from esbriet, she will have to STOP the medication We can call in lomotil for the diarrhea 1-2 as needed every 6 hours

## 2013-10-26 ENCOUNTER — Ambulatory Visit (INDEPENDENT_AMBULATORY_CARE_PROVIDER_SITE_OTHER): Payer: Medicare Other | Admitting: Critical Care Medicine

## 2013-10-26 ENCOUNTER — Encounter: Payer: Self-pay | Admitting: Critical Care Medicine

## 2013-10-26 VITALS — BP 126/62 | HR 70 | Temp 97.9°F | Ht 61.0 in | Wt 167.0 lb

## 2013-10-26 DIAGNOSIS — J8410 Pulmonary fibrosis, unspecified: Secondary | ICD-10-CM | POA: Diagnosis not present

## 2013-10-26 NOTE — Progress Notes (Signed)
Subjective:    Patient ID: Tammy Hughes, female    DOB: Aug 06, 1932, 80 y.o.   MRN: [id number]  Cough This is a new problem. The current episode started 1 to 4 weeks ago. The problem has been gradually improving. The problem occurs every few hours (dry cough). The cough is non-productive. Associated symptoms include heartburn and a sore throat. Pertinent negatives include no chest pain, chills, ear congestion, ear pain, eye redness, fever, headaches, hemoptysis, myalgias, nasal congestion, postnasal drip, rash, rhinorrhea, shortness of breath, sweats, weight loss or wheezing. The symptoms are aggravated by lying down (worse after eating). Risk factors for lung disease include smoking/tobacco exposure (passive smoke exposure only). Treatments tried: Abx helped. The treatment provided moderate relief. There is no history of asthma, bronchiectasis, bronchitis, COPD, emphysema, environmental allergies or pneumonia.   10/26/2013 Chief Complaint  Patient presents with  . Follow-up    Pt states she feels much better since stopping the Esbriet.  has no breathing complaints at this time.    Not able to tolerate Esbiret Pt c/o diarrhea , numbness, GI issues. Has resolved since stop. No issues now, reflux is better No real cough NO oxygen needed  Past Medical History  Diagnosis Date  . DIABETES MELLITUS, TYPE I 02/23/2007  . HYPERCHOLESTEROLEMIA 02/01/2008  . ANXIETY 03/08/2008  . PERIPHERAL NEUROPATHY 02/23/2007  . HYPERTENSION 03/08/2008  . GERD 02/23/2007  . DIVERTICULOSIS, COLON 03/08/2008  . RECTAL BLEEDING 03/21/2010  . OSTEOARTHRITIS 02/23/2007  . OSTEOPOROSIS 02/23/2007  . Urolithiasis      Family History  Problem Relation Age of Onset  . Cancer Father     Prostate Cancer  . Cancer Sister     Breast Cancer  . Diabetes Sister   . Diabetes Sister      History   Social History  . Marital Status: Married    Spouse Name: N/A    Number of Children: N/A  . Years of Education: N/A    Occupational History  . Retired    Social History Main Topics  . Smoking status: Never Smoker   . Smokeless tobacco: Not on file  . Alcohol Use: No  . Drug Use:   . Sexual Activity:    Other Topics Concern  . Not on file   Social History Narrative   Dentist           Allergies  Allergen Reactions  . Aspirin     rash     Outpatient Prescriptions Prior to Visit  Medication Sig Dispense Refill  . atorvastatin (LIPITOR) 40 MG tablet Take 20 mg by mouth daily.       . Calcium Carbonate-Vit D-Min (CALCIUM 600 + MINERALS) 600-200 MG-UNIT TABS Take 1 tablet by mouth daily.        . diphenoxylate-atropine (LOMOTIL) 2.5-0.025 MG per tablet Take 1-2 tablets by mouth every 6 (six) hours as needed for diarrhea or loose stools.  30 tablet  0  . furosemide (LASIX) 20 MG tablet TAKE 1 TABLET BY MOUTH DAILY AS DIRECTED  90 tablet  prn  . HYDROcodone-acetaminophen (VICODIN) 5-500 MG per tablet Take 1 tablet by mouth every 4 (four) hours as needed. For pain  120 tablet  0  . hydrocortisone (ANUSOL-HC) 25 MG suppository Place rectally. As needed for bleeding       . hyoscyamine (LEVBID) 0.375 MG 12 hr tablet Take 0.375 mg by mouth 2 (two) times daily as needed. As needed for abdominal pain       .  Insulin Aspart Prot & Aspart (NOVOLOG MIX 70/30 FLEXPEN) (70-30) 100 UNIT/ML Pen 70 units SQ daily with breakfast and inject 60 units SQ daily with evening meal, and pen needles 2/day  135 mL  11  . Insulin Pen Needle 31G X 8 MM MISC USE AS DIRECTED WITH INSULIN TWICE DAILY Dx: 250.01  200 each  prn  . omeprazole (PRILOSEC) 20 MG capsule Take 1 capsule (20 mg total) by mouth 2 (two) times daily before a meal.  60 capsule  5   No facility-administered medications prior to visit.      Review of Systems  Constitutional: Negative for fever, chills, weight loss and unexpected weight change.  HENT: Positive for sore throat. Negative for congestion, dental problem, ear pain,  nosebleeds, postnasal drip, rhinorrhea, sinus pressure, sneezing and trouble swallowing.   Eyes: Negative for redness and itching.  Respiratory: Positive for cough. Negative for hemoptysis, chest tightness, shortness of breath and wheezing.   Cardiovascular: Positive for leg swelling. Negative for chest pain and palpitations.       Hand and feet  Gastrointestinal: Positive for heartburn and abdominal distention. Negative for nausea and vomiting.       Heartburn and indigestion  Genitourinary: Negative for dysuria.  Musculoskeletal: Negative for myalgias.  Skin: Negative for rash.  Allergic/Immunologic: Negative for environmental allergies.  Neurological: Negative for headaches.  Hematological: Does not bruise/bleed easily.  Psychiatric/Behavioral: Negative for dysphoric mood. The patient is not nervous/anxious.        Objective:   Physical Exam  Filed Vitals:   10/26/13 1629  BP: 126/62  Pulse: 70  Temp: 97.9 F (36.6 C)  TempSrc: Oral  Height: 5\' 1"  (1.549 m)  Weight: 167 lb (75.751 kg)  SpO2: 94%    Gen: Pleasant, well-nourished, in no distress,  normal affect  ENT: No lesions,  mouth clear,  oropharynx clear, no postnasal drip  Neck: No JVD, no TMG, no carotid bruits  Lungs: No use of accessory muscles, no dullness to percussion, dry rales bibasilar  Cardiovascular: RRR, heart sounds normal, no murmur or gallops, no peripheral edema  Abdomen: soft and NT, no HSM,  BS normal  Musculoskeletal: No deformities, no cyanosis or clubbing  Neuro: alert, non focal  Skin: Warm, no lesions or rashes  No results found.        Assessment & Plan:   Postinflammatory pulmonary fibrosis ILD uip/ipf.  Adverse GI reaction to Esbriet.  Not a candidate for OFEV.  No data to support steroids or imuran. ILD symptoms improved.   Strict reflux control is critical Plan Stop esbriet, as you have done No other medication changes Stay on omeprazole Follow reflux diet Return 4   months for recheck, sooner if you worsen     Updated Medication List Outpatient Encounter Prescriptions as of 10/26/2013  Medication Sig  . atorvastatin (LIPITOR) 40 MG tablet Take 20 mg by mouth daily.   . Calcium Carbonate-Vit D-Min (CALCIUM 600 + MINERALS) 600-200 MG-UNIT TABS Take 1 tablet by mouth daily.    . diphenoxylate-atropine (LOMOTIL) 2.5-0.025 MG per tablet Take 1-2 tablets by mouth every 6 (six) hours as needed for diarrhea or loose stools.  . furosemide (LASIX) 20 MG tablet TAKE 1 TABLET BY MOUTH DAILY AS DIRECTED  . HYDROcodone-acetaminophen (VICODIN) 5-500 MG per tablet Take 1 tablet by mouth every 4 (four) hours as needed. For pain  . hydrocortisone (ANUSOL-HC) 25 MG suppository Place rectally. As needed for bleeding   . hyoscyamine (LEVBID) 0.375 MG 12  hr tablet Take 0.375 mg by mouth 2 (two) times daily as needed. As needed for abdominal pain   . Insulin Aspart Prot & Aspart (NOVOLOG MIX 70/30 FLEXPEN) (70-30) 100 UNIT/ML Pen 70 units SQ daily with breakfast and inject 60 units SQ daily with evening meal, and pen needles 2/day  . Insulin Pen Needle 31G X 8 MM MISC USE AS DIRECTED WITH INSULIN TWICE DAILY Dx: 250.01  . omeprazole (PRILOSEC) 20 MG capsule Take 1 capsule (20 mg total) by mouth 2 (two) times daily before a meal.

## 2013-10-26 NOTE — Patient Instructions (Signed)
Stop esbriet, as you have done No other medication changes Stay on omeprazole Follow reflux diet Return 4  months for recheck, sooner if you worsen

## 2013-10-29 NOTE — Assessment & Plan Note (Signed)
ILD uip/ipf.  Adverse GI reaction to Esbriet.  Not a candidate for OFEV.  No data to support steroids or imuran. ILD symptoms improved.   Strict reflux control is critical Plan Stop esbriet, as you have done No other medication changes Stay on omeprazole Follow reflux diet Return 4  months for recheck, sooner if you worsen

## 2013-11-03 ENCOUNTER — Telehealth: Payer: Self-pay | Admitting: Critical Care Medicine

## 2013-11-03 NOTE — Telephone Encounter (Signed)
Per OV 10/26/13: Patient Instructions      Stop esbriet, as you have done No other medication changes Stay on omeprazole Follow reflux diet Return 4  months for recheck, sooner if you worsen  ---  Esbriet was stopped at last [facility]. The ext left is not correct. I spoke with a rep and was transferred to [facility].  LMTCB x1

## 2013-11-04 ENCOUNTER — Ambulatory Visit: Payer: Medicare Other | Admitting: Endocrinology

## 2013-11-04 NOTE — Telephone Encounter (Signed)
Nicki with [facility] advised, she will make a note on pt file.Roselle Bing, CMA

## 2013-11-09 ENCOUNTER — Encounter: Payer: Self-pay | Admitting: Endocrinology

## 2013-11-12 ENCOUNTER — Ambulatory Visit (INDEPENDENT_AMBULATORY_CARE_PROVIDER_SITE_OTHER): Payer: Medicare Other | Admitting: Endocrinology

## 2013-11-12 ENCOUNTER — Encounter: Payer: Self-pay | Admitting: Endocrinology

## 2013-11-12 VITALS — BP 122/76 | HR 69 | Temp 98.0°F | Ht 61.0 in | Wt 167.0 lb

## 2013-11-12 DIAGNOSIS — E109 Type 1 diabetes mellitus without complications: Secondary | ICD-10-CM | POA: Diagnosis not present

## 2013-11-12 DIAGNOSIS — Z23 Encounter for immunization: Secondary | ICD-10-CM

## 2013-11-12 LAB — HEMOGLOBIN A1C: Hgb A1c MFr Bld: 7.1 % — ABNORMAL HIGH (ref 4.6–6.5)

## 2013-11-12 MED ORDER — METHOCARBAMOL 500 MG PO TABS: 500.0000 mg | ORAL_TABLET | Freq: Every day | ORAL | Status: DC

## 2013-11-12 MED ORDER — TRIAMCINOLONE ACETONIDE 0.1 % EX CREA
1.0000 | TOPICAL_CREAM | Freq: Four times a day (QID) | CUTANEOUS | Status: DC
Start: 2013-11-12 — End: 2014-08-10

## 2013-11-12 NOTE — Patient Instructions (Addendum)
i have sent 2 prescriptions to your pharmacy: for a skin cream to stop the itching, and for the leg cramps. blood tests are being requested for you today.  We'll contact you with results. good diet and exercise habits significanly improve the control of your diabetes.  please let me know if you wish to be referred to a dietician.  high blood sugar is very risky to your health.  you should see an eye doctor every year.  You are at higher than average risk for pneumonia and hepatitis-B.  You should be vaccinated against both.   please consider these measures for your health:  minimize alcohol.  do not use tobacco products.  have a colonoscopy at least every 10 years from age 11.  Women should have an annual mammogram from age 28.  keep firearms safely stored.  always use seat belts.  have working smoke alarms in your home.  see an eye doctor and dentist regularly.  never drive under the influence of alcohol or drugs (including prescription drugs).   it is critically important to prevent falling down (keep floor areas well-lit, dry, and free of loose objects.  If you have a cane, walker, or wheelchair, you should use it, even for short trips around the house.  Also, try not to rush).   You should have a vaccine against shingles (a painful rash which results from the  chickenpox infection which most people had many years ago).  This vaccine reduces, but does not totally eliminate the risk of shingles.  Because this is a medicare part d benefit, you should get it at a pharmacy.   Please come back for a follow-up appointment in 3-4 months.

## 2013-11-12 NOTE — Progress Notes (Signed)
Subjective:    Patient ID: Tammy Hughes, female    DOB: 1932-08-16, 80 y.o.   MRN: [id number]  HPI Pt returns for f/u of insulin-requiring DM (dx'ed 1990; she has moderate neuropathy of the lower extremities; no assoc chronic complications; she has been on insulin since 2005; she chose bid premixed insulin).  no cbg record, but states cbg's are in the low-100's.  It is in general higher as the day goes on.  She denies hypoglycemia.   Past Medical History  Diagnosis Date  . DIABETES MELLITUS, TYPE I 02/23/2007  . HYPERCHOLESTEROLEMIA 02/01/2008  . ANXIETY 03/08/2008  . PERIPHERAL NEUROPATHY 02/23/2007  . HYPERTENSION 03/08/2008  . GERD 02/23/2007  . DIVERTICULOSIS, COLON 03/08/2008  . RECTAL BLEEDING 03/21/2010  . OSTEOARTHRITIS 02/23/2007  . OSTEOPOROSIS 02/23/2007  . Urolithiasis     Past Surgical History  Procedure Laterality Date  . Cholecystectomy  1995  . Esophagogastroduodenoscopy  06/08/2002    History   Social History  . Marital Status: Married    Spouse Name: N/A    Number of Children: N/A  . Years of Education: N/A   Occupational History  . Retired    Social History Main Topics  . Smoking status: Never Smoker   . Smokeless tobacco: Not on file  . Alcohol Use: No  . Drug Use:   . Sexual Activity:    Other Topics Concern  . Not on file   Social History Narrative   Dentist          Current Outpatient Prescriptions on File Prior to Visit  Medication Sig Dispense Refill  . atorvastatin (LIPITOR) 40 MG tablet Take 20 mg by mouth daily.       . Calcium Carbonate-Vit D-Min (CALCIUM 600 + MINERALS) 600-200 MG-UNIT TABS Take 1 tablet by mouth daily.        . diphenoxylate-atropine (LOMOTIL) 2.5-0.025 MG per tablet Take 1-2 tablets by mouth every 6 (six) hours as needed for diarrhea or loose stools.  30 tablet  0  . furosemide (LASIX) 20 MG tablet TAKE 1 TABLET BY MOUTH DAILY AS DIRECTED  90 tablet  prn  . HYDROcodone-acetaminophen (VICODIN)  5-500 MG per tablet Take 1 tablet by mouth every 4 (four) hours as needed. For pain  120 tablet  0  . hydrocortisone (ANUSOL-HC) 25 MG suppository Place rectally. As needed for bleeding       . hyoscyamine (LEVBID) 0.375 MG 12 hr tablet Take 0.375 mg by mouth 2 (two) times daily as needed. As needed for abdominal pain       . Insulin Aspart Prot & Aspart (NOVOLOG MIX 70/30 FLEXPEN) (70-30) 100 UNIT/ML Pen 70 units SQ daily with breakfast and inject 60 units SQ daily with evening meal, and pen needles 2/day  135 mL  11  . Insulin Pen Needle 31G X 8 MM MISC USE AS DIRECTED WITH INSULIN TWICE DAILY Dx: 250.01  200 each  prn  . omeprazole (PRILOSEC) 20 MG capsule Take 1 capsule (20 mg total) by mouth 2 (two) times daily before a meal.  60 capsule  5   No current facility-administered medications on file prior to visit.    Allergies  Allergen Reactions  . Pirfenidone Diarrhea and Nausea And Vomiting  . Aspirin     rash    Family History  Problem Relation Age of Onset  . Cancer Father     Prostate Cancer  . Cancer Sister     Breast Cancer  .  Diabetes Sister   . Diabetes Sister     BP 122/76  Pulse 69  Temp(Src) 98 F (36.7 C) (Oral)  Ht 5\' 1"  (1.549 m)  Wt 167 lb (75.751 kg)  BMI 31.57 kg/m2  SpO2 94%   Review of Systems Pt reports itching, worse on her back.  She has nocturnal leg cramps.    Objective:   Physical Exam VITAL SIGNS:  See vs page GENERAL: no distress Skin: no rash on the back.   Lab Results  Component Value Date   HGBA1C 7.1* 11/12/2013      Assessment & Plan:  HTN: well-controlled, off the losartan DM: this is the best control this pt should aim for, given this regimen, which does match insulin to her changing needs throughout the day Pruritis, new, uncertain etiology.   Subjective:   Patient here for Medicare annual wellness visit and management of other chronic and acute problems.     Risk factors: advanced age    [facility] [employer]  Providing Medical Care to Patient:  See "snapshot"   Activities of Daily Living: In your present state of health, do you have any difficulty performing the following activities?:  Preparing food and eating?: No  Bathing yourself: No  Getting dressed: No  Using the toilet:No  Moving around from place to place: No  In the past year have you fallen or had a near fall?:No    Home Safety: Has smoke detector and wears seat belts. No firearms.  Diet and Exercise  Current exercise habits: pt says good Dietary issues discussed: pt reports a healthy diet   Depression Screen  Q1: Over the past two weeks, have you felt down, depressed or hopeless? no  Q2: Over the past two weeks, have you felt little interest or pleasure in doing things? no   The following portions of the patient's history were reviewed and updated as appropriate: allergies, current medications, past family history, past medical history, past social history, past surgical history and problem list.  Past Medical History  Diagnosis Date  . DIABETES MELLITUS, TYPE I 02/23/2007  . HYPERCHOLESTEROLEMIA 02/01/2008  . ANXIETY 03/08/2008  . PERIPHERAL NEUROPATHY 02/23/2007  . HYPERTENSION 03/08/2008  . GERD 02/23/2007  . DIVERTICULOSIS, COLON 03/08/2008  . RECTAL BLEEDING 03/21/2010  . OSTEOARTHRITIS 02/23/2007  . OSTEOPOROSIS 02/23/2007  . Urolithiasis     Past Surgical History  Procedure Laterality Date  . Cholecystectomy  1995  . Esophagogastroduodenoscopy  06/08/2002    History   Social History  . Marital Status: Married    Spouse Name: N/A    Number of Children: N/A  . Years of Education: N/A   Occupational History  . Retired    Social History Main Topics  . Smoking status: Never Smoker   . Smokeless tobacco: Not on file  . Alcohol Use: No  . Drug Use:   . Sexual Activity:    Other Topics Concern  . Not on file   Social History Narrative   Dentist          Current Outpatient Prescriptions  on File Prior to Visit  Medication Sig Dispense Refill  . atorvastatin (LIPITOR) 40 MG tablet Take 20 mg by mouth daily.       . Calcium Carbonate-Vit D-Min (CALCIUM 600 + MINERALS) 600-200 MG-UNIT TABS Take 1 tablet by mouth daily.        . diphenoxylate-atropine (LOMOTIL) 2.5-0.025 MG per tablet Take 1-2 tablets by mouth every 6 (six) hours as needed  for diarrhea or loose stools.  30 tablet  0  . furosemide (LASIX) 20 MG tablet TAKE 1 TABLET BY MOUTH DAILY AS DIRECTED  90 tablet  prn  . HYDROcodone-acetaminophen (VICODIN) 5-500 MG per tablet Take 1 tablet by mouth every 4 (four) hours as needed. For pain  120 tablet  0  . hydrocortisone (ANUSOL-HC) 25 MG suppository Place rectally. As needed for bleeding       . hyoscyamine (LEVBID) 0.375 MG 12 hr tablet Take 0.375 mg by mouth 2 (two) times daily as needed. As needed for abdominal pain       . Insulin Aspart Prot & Aspart (NOVOLOG MIX 70/30 FLEXPEN) (70-30) 100 UNIT/ML Pen 70 units SQ daily with breakfast and inject 60 units SQ daily with evening meal, and pen needles 2/day  135 mL  11  . Insulin Pen Needle 31G X 8 MM MISC USE AS DIRECTED WITH INSULIN TWICE DAILY Dx: 250.01  200 each  prn  . omeprazole (PRILOSEC) 20 MG capsule Take 1 capsule (20 mg total) by mouth 2 (two) times daily before a meal.  60 capsule  5   No current facility-administered medications on file prior to visit.    Allergies  Allergen Reactions  . Pirfenidone Diarrhea and Nausea And Vomiting  . Aspirin     rash    Family History  Problem Relation Age of Onset  . Cancer Father     Prostate Cancer  . Cancer Sister     Breast Cancer  . Diabetes Sister   . Diabetes Sister     BP 122/76  Pulse 69  Temp(Src) 98 F (36.7 C) (Oral)  Ht 5\' 1"  (1.549 m)  Wt 167 lb (75.751 kg)  BMI 31.57 kg/m2  SpO2 94%   Review of Systems  Denies hearing loss, and visual loss Objective:   Vision:  Sees opthalmologist Hearing: grossly normal Body mass index:  See vs  page Msk: pt easily and quickly performs "get-up-and-go" from a sitting position Cognitive Impairment Assessment: cognition, memory and judgment appear normal.  remembers 2/3 at 5 minutes (? Effort).  excellent recall.  can easily read and write a sentence.  alert and oriented x 3.     Assessment:   Medicare wellness utd on preventive parameters.     Plan:   During the course of the visit the patient was educated and counseled about appropriate screening and preventive services including:        Fall prevention   Screening mammography  Bone densitometry screening  Diabetes screening  Nutrition counseling   Vaccines / LABS Zostavax / Pneumococcal Vaccine today.    Patient Instructions (the written plan) was given to the patient.   we discussed code status.  pt requests full code, but would not want to be started or maintained on artificial life-support measures if there was not a reasonable chance of recovery

## 2013-11-24 ENCOUNTER — Telehealth: Payer: Self-pay

## 2013-11-24 DIAGNOSIS — E119 Type 2 diabetes mellitus without complications: Secondary | ICD-10-CM | POA: Diagnosis not present

## 2013-11-24 DIAGNOSIS — Z79899 Other long term (current) drug therapy: Secondary | ICD-10-CM | POA: Diagnosis not present

## 2013-11-24 DIAGNOSIS — Z794 Long term (current) use of insulin: Secondary | ICD-10-CM | POA: Diagnosis not present

## 2013-11-24 DIAGNOSIS — M129 Arthropathy, unspecified: Secondary | ICD-10-CM | POA: Diagnosis not present

## 2013-11-24 DIAGNOSIS — R252 Cramp and spasm: Secondary | ICD-10-CM | POA: Diagnosis not present

## 2013-11-24 DIAGNOSIS — M79609 Pain in unspecified limb: Secondary | ICD-10-CM | POA: Diagnosis not present

## 2013-11-24 NOTE — Telephone Encounter (Signed)
Pt called stating that she is having some pain in her right calf. The Pain started last night. Pt denied having any swelling or heat coming from the area. She states it feels like a lump. Wanted to know if anything could be done without her having to be seen.  Please advise, Thanks!

## 2013-11-24 NOTE — Telephone Encounter (Signed)
Ov needed, here or urgent care, because we need to make sure there is no blood clot.

## 2013-11-24 NOTE — Telephone Encounter (Signed)
i agree, because they will likely need to do ultrasound.

## 2013-11-24 NOTE — Telephone Encounter (Signed)
Pt informed that office visit would be needed and she states that she will go to her local hospital to have this checked because there is not a urgent care close by and she did not drive here.

## 2013-12-14 DIAGNOSIS — Z794 Long term (current) use of insulin: Secondary | ICD-10-CM | POA: Diagnosis not present

## 2013-12-14 DIAGNOSIS — W57XXXA Bitten or stung by nonvenomous insect and other nonvenomous arthropods, initial encounter: Secondary | ICD-10-CM | POA: Diagnosis not present

## 2013-12-14 DIAGNOSIS — S90569A Insect bite (nonvenomous), unspecified ankle, initial encounter: Secondary | ICD-10-CM | POA: Diagnosis not present

## 2013-12-14 DIAGNOSIS — E119 Type 2 diabetes mellitus without complications: Secondary | ICD-10-CM | POA: Diagnosis not present

## 2013-12-16 ENCOUNTER — Telehealth: Payer: Self-pay

## 2013-12-16 NOTE — Telephone Encounter (Signed)
A rep called and is hoping to get information about a knee and back brace the pt was needing. She stated she spoke to you about the form previously.   [facility] , phone # - [phone]

## 2013-12-16 NOTE — Telephone Encounter (Signed)
MD decided not to sign off on form. Pt does not need supplies.

## 2013-12-17 ENCOUNTER — Telehealth: Payer: Self-pay | Admitting: Critical Care Medicine

## 2013-12-17 NOTE — Telephone Encounter (Signed)
Spoke with Tammy Hughes with Tammy Hughes / [facility] Per Tammy Hughes, there are notes documented in patients chart from a [facility] nor has anyone attempted to contact her. Per Tammy Hughes, they have documented last that the patient was d/c from med back in April. They do not currently have the patient listed as being a current recipient of this drug.  Spoke with pt-- states that she did not contact our office requesting anything about Esbriet. Pt states that she has not taken this medication since April and has not heard anything from [facility] or [facility] recently. Pt states that she has been getting random phone calls from different states/counties from different organizations requesting insurance and personal information over the phone.  Pt states that she just hangs up the phone in them.   Nothing further needed.

## 2013-12-28 ENCOUNTER — Telehealth: Payer: Self-pay | Admitting: Endocrinology

## 2013-12-28 NOTE — Telephone Encounter (Signed)
Please see below.

## 2013-12-28 NOTE — Telephone Encounter (Signed)
What is the status of the pts knee and back brace rx's sent to [place] on 11/30/13

## 2013-12-31 ENCOUNTER — Telehealth: Payer: Self-pay | Admitting: Endocrinology

## 2013-12-31 NOTE — Telephone Encounter (Signed)
Contacted pt. Pt did not call and request for a back or knee brace. They called her and she told them she did not want or need one. This is a scam.

## 2013-12-31 NOTE — Telephone Encounter (Signed)
Have we received the back and knee brace rx and are they complete

## 2014-02-03 ENCOUNTER — Telehealth: Payer: Self-pay | Admitting: Endocrinology

## 2014-02-03 NOTE — Telephone Encounter (Signed)
See below and please advise if ok to refill. Medication was filled by Dr. Joya Gaskins last. Thanks!

## 2014-02-03 NOTE — Telephone Encounter (Signed)
Please refill prn 

## 2014-02-03 NOTE — Telephone Encounter (Signed)
Patient would like for you to call in her Prilosec  She is currently out   Thank You

## 2014-02-04 ENCOUNTER — Other Ambulatory Visit: Payer: Self-pay | Admitting: Critical Care Medicine

## 2014-02-04 MED ORDER — OMEPRAZOLE 20 MG PO CPDR: 20.0000 mg | DELAYED_RELEASE_CAPSULE | Freq: Two times a day (BID) | ORAL | Status: DC

## 2014-02-04 NOTE — Telephone Encounter (Signed)
Rx refilled.

## 2014-02-04 NOTE — Telephone Encounter (Signed)
Per 4.14.15 ov w/ PW: Patient Instructions     Stop esbriet, as you have done  No other medication changes  Stay on omeprazole  Follow reflux diet  Return 4 months for recheck, sooner if you worsen   Rx was sent to pharmacy this morning by PCP Dr Loanne Drilling - nothing further needed. Will sign off

## 2014-02-11 ENCOUNTER — Other Ambulatory Visit: Payer: Self-pay

## 2014-02-11 ENCOUNTER — Telehealth: Payer: Self-pay | Admitting: Endocrinology

## 2014-02-11 MED ORDER — INSULIN ASPART PROT & ASPART (70-30 MIX) 100 UNIT/ML PEN: PEN_INJECTOR | SUBCUTANEOUS | Status: DC

## 2014-02-11 NOTE — Telephone Encounter (Signed)
Patient would like all of her rx called in to her mail order pharmacy  Patient states Dr. Cordelia Pen nurse is aware of the medication    Thank you

## 2014-02-11 NOTE — Telephone Encounter (Signed)
Called pt to verify med that needed to be refilled. Pt needed a refill on her Novolog. Rx sent to mail order pharmacy per her request. Pt advised.

## 2014-02-15 ENCOUNTER — Other Ambulatory Visit: Payer: Self-pay | Admitting: Endocrinology

## 2014-03-07 DIAGNOSIS — E109 Type 1 diabetes mellitus without complications: Secondary | ICD-10-CM | POA: Diagnosis not present

## 2014-03-15 ENCOUNTER — Encounter: Payer: Self-pay | Admitting: Endocrinology

## 2014-03-15 ENCOUNTER — Ambulatory Visit (INDEPENDENT_AMBULATORY_CARE_PROVIDER_SITE_OTHER): Payer: Medicare Other | Admitting: Endocrinology

## 2014-03-15 VITALS — BP 140/80 | HR 87 | Temp 98.3°F | Ht 61.0 in | Wt 165.0 lb

## 2014-03-15 DIAGNOSIS — E109 Type 1 diabetes mellitus without complications: Secondary | ICD-10-CM

## 2014-03-15 DIAGNOSIS — M25569 Pain in unspecified knee: Secondary | ICD-10-CM

## 2014-03-15 MED ORDER — ATORVASTATIN CALCIUM 40 MG PO TABS: 20.0000 mg | ORAL_TABLET | Freq: Every day | ORAL | Status: DC

## 2014-03-15 NOTE — Progress Notes (Signed)
Subjective:    Patient ID: Tammy Hughes, female    DOB: 1933-07-01, 80 y.o.   MRN: [id number]  HPI Pt returns for f/u of insulin-requiring DM (dx'ed 3151; complicated by sensory neuropathy of the lower extremities; she has been on insulin since 2005; she chose bid premixed insulin; she has never had pancreatitis, severe hypoglycemia, or DKA;).  no cbg record, but states cbg's are in the low-100's.  It is in general higher as the day goes on.  Pt states 1 month of moderate pain in the calf areas, in the context of walking.  No assoc numbness.  sxs resolve with rest.   Past Medical History  Diagnosis Date  . DIABETES MELLITUS, TYPE I 02/23/2007  . HYPERCHOLESTEROLEMIA 02/01/2008  . ANXIETY 03/08/2008  . PERIPHERAL NEUROPATHY 02/23/2007  . HYPERTENSION 03/08/2008  . GERD 02/23/2007  . DIVERTICULOSIS, COLON 03/08/2008  . RECTAL BLEEDING 03/21/2010  . OSTEOARTHRITIS 02/23/2007  . OSTEOPOROSIS 02/23/2007  . Urolithiasis     Past Surgical History  Procedure Laterality Date  . Cholecystectomy  1995  . Esophagogastroduodenoscopy  06/08/2002    History   Social History  . Marital Status: Married    Spouse Name: N/A    Number of Children: N/A  . Years of Education: N/A   Occupational History  . Retired    Social History Main Topics  . Smoking status: Never Smoker   . Smokeless tobacco: Not on file  . Alcohol Use: No  . Drug Use:   . Sexual Activity:    Other Topics Concern  . Not on file   Social History Narrative   Dentist          Current Outpatient Prescriptions on File Prior to Visit  Medication Sig Dispense Refill  . Calcium Carbonate-Vit D-Min (CALCIUM 600 + MINERALS) 600-200 MG-UNIT TABS Take 1 tablet by mouth daily.        . diphenoxylate-atropine (LOMOTIL) 2.5-0.025 MG per tablet Take 1-2 tablets by mouth every 6 (six) hours as needed for diarrhea or loose stools.  30 tablet  0  . furosemide (LASIX) 20 MG tablet TAKE 1 TABLET BY MOUTH DAILY AS  DIRECTED  90 tablet  prn  . HYDROcodone-acetaminophen (VICODIN) 5-500 MG per tablet Take 1 tablet by mouth every 4 (four) hours as needed. For pain  120 tablet  0  . hydrocortisone (ANUSOL-HC) 25 MG suppository Place rectally. As needed for bleeding       . hyoscyamine (LEVBID) 0.375 MG 12 hr tablet Take 0.375 mg by mouth 2 (two) times daily as needed. As needed for abdominal pain       . Insulin Pen Needle 31G X 8 MM MISC USE AS DIRECTED WITH INSULIN TWICE DAILY Dx: 250.01  200 each  prn  . methocarbamol (ROBAXIN) 500 MG tablet Take 1 tablet (500 mg total) by mouth at bedtime. For leg cramps  30 tablet  11  . NOVOLOG MIX 70/30 FLEXPEN (70-30) 100 UNIT/ML Pen INJECT 70 UNITS            SUBCUTANEOUSLY EVERY DAY   WITH BREAKFAST AND 60 UNITSWITH THE EVENING MEAL  45 mL  4  . omeprazole (PRILOSEC) 20 MG capsule Take 1 capsule (20 mg total) by mouth 2 (two) times daily before a meal.  60 capsule  5  . triamcinolone cream (KENALOG) 0.1 % Apply 1 application topically 4 (four) times daily. As needed for itching  45 g  2   No current facility-administered medications  on file prior to visit.    Allergies  Allergen Reactions  . Pirfenidone Diarrhea and Nausea And Vomiting  . Aspirin     rash    Family History  Problem Relation Age of Onset  . Cancer Father     Prostate Cancer  . Cancer Sister     Breast Cancer  . Diabetes Sister   . Diabetes Sister     BP 140/80  Pulse 87  Temp(Src) 98.3 F (36.8 C) (Oral)  Ht 5\' 1"  (1.549 m)  Wt 165 lb (74.844 kg)  BMI 31.19 kg/m2  SpO2 94%  Review of Systems She denies hypoglycemia and weight change    Objective:   Physical Exam VITAL SIGNS:  See vs page GENERAL: no distress Pulses: dorsalis pedis intact bilat.   Feet: no deformity. normal color and temp.  no edema Skin:  no ulcer on the feet.   Neuro: sensation is intact to touch on the feet      Assessment & Plan:  DM: mild exacerbation Leg pain, new, uncertain etiology.  R/o  claudication.  Patient is advised the following: Patient Instructions  Let's check a circulation test of the legs.  you will receive a phone call, about a day and time for an appointment.   blood tests are being requested for you today.  We'll contact you with results. check your blood sugar twice a day.  vary the time of day when you check, between before the 3 meals, and at bedtime.  also check if you have symptoms of your blood sugar being too high or too low.  please keep a record of the readings and bring it to your next appointment here.  You can write it on any piece of paper.  please call us sooner if your blood sugar goes below 70, or if you have a lot of readings over 200. Please come back for a follow-up appointment in 3 months.

## 2014-03-15 NOTE — Patient Instructions (Addendum)
Let's check a circulation test of the legs.  you will receive a phone call, about a day and time for an appointment.   blood tests are being requested for you today.  We'll contact you with results. check your blood sugar twice a day.  vary the time of day when you check, between before the 3 meals, and at bedtime.  also check if you have symptoms of your blood sugar being too high or too low.  please keep a record of the readings and bring it to your next appointment here.  You can write it on any piece of paper.  please call us sooner if your blood sugar goes below 70, or if you have a lot of readings over 200. Please come back for a follow-up appointment in 3 months.

## 2014-03-23 ENCOUNTER — Ambulatory Visit (HOSPITAL_COMMUNITY): Payer: Medicare Other | Attending: Cardiology | Admitting: *Deleted

## 2014-03-23 ENCOUNTER — Other Ambulatory Visit (INDEPENDENT_AMBULATORY_CARE_PROVIDER_SITE_OTHER): Payer: Medicare Other

## 2014-03-23 ENCOUNTER — Other Ambulatory Visit: Payer: Self-pay

## 2014-03-23 DIAGNOSIS — R7989 Other specified abnormal findings of blood chemistry: Secondary | ICD-10-CM

## 2014-03-23 DIAGNOSIS — E785 Hyperlipidemia, unspecified: Secondary | ICD-10-CM | POA: Insufficient documentation

## 2014-03-23 DIAGNOSIS — E109 Type 1 diabetes mellitus without complications: Secondary | ICD-10-CM

## 2014-03-23 DIAGNOSIS — I10 Essential (primary) hypertension: Secondary | ICD-10-CM | POA: Diagnosis not present

## 2014-03-23 DIAGNOSIS — M25569 Pain in unspecified knee: Secondary | ICD-10-CM | POA: Diagnosis not present

## 2014-03-23 DIAGNOSIS — I70219 Atherosclerosis of native arteries of extremities with intermittent claudication, unspecified extremity: Secondary | ICD-10-CM | POA: Diagnosis not present

## 2014-03-23 DIAGNOSIS — E119 Type 2 diabetes mellitus without complications: Secondary | ICD-10-CM | POA: Diagnosis not present

## 2014-03-23 LAB — LIPID PANEL
CHOL/HDL RATIO: 5
Cholesterol: 161 mg/dL (ref 0–200)
HDL: 34.8 mg/dL — ABNORMAL LOW (ref >39.00)
NONHDL: 126.2
Triglycerides: 242 mg/dL — ABNORMAL HIGH (ref 0.0–149.0)
VLDL: 48.4 mg/dL — ABNORMAL HIGH (ref 0.0–40.0)

## 2014-03-23 LAB — HEMOGLOBIN A1C: Hgb A1c MFr Bld: 8.1 % — ABNORMAL HIGH (ref 4.6–6.5)

## 2014-03-23 LAB — LDL CHOLESTEROL, DIRECT: Direct LDL: 102.9 mg/dL

## 2014-03-23 MED ORDER — OMEPRAZOLE 20 MG PO CPDR: 20.0000 mg | DELAYED_RELEASE_CAPSULE | Freq: Two times a day (BID) | ORAL | Status: DC

## 2014-03-23 MED ORDER — INSULIN ASPART PROT & ASPART (70-30 MIX) 100 UNIT/ML PEN: PEN_INJECTOR | SUBCUTANEOUS | Status: DC

## 2014-03-23 MED ORDER — FUROSEMIDE 20 MG PO TABS: ORAL_TABLET | ORAL | Status: DC

## 2014-03-23 NOTE — Progress Notes (Signed)
Lower Arterial Doppler Complete

## 2014-04-04 ENCOUNTER — Telehealth: Payer: Self-pay | Admitting: Endocrinology

## 2014-04-04 MED ORDER — ROSUVASTATIN CALCIUM 10 MG PO TABS: 10.0000 mg | ORAL_TABLET | Freq: Every day | ORAL | Status: DC

## 2014-04-04 NOTE — Telephone Encounter (Signed)
Pt's legs are bothering her and she thinks its the lipitor. Can we call in an alternate  If can alternate please call into [facility]

## 2014-04-04 NOTE — Telephone Encounter (Signed)
Pt advised of below. Pt states that she has been taking 1/2 pill already. Pt would like for the alternative to be sent to her pharmacy and see if she can afford to purchase the medication.  Please advise, Thanks!

## 2014-04-04 NOTE — Telephone Encounter (Signed)
i would be happy to rx alternative, but it is much more expensive. Other alternative is to try 1/2 pill of lipitor.

## 2014-04-04 NOTE — Telephone Encounter (Signed)
See below and please advise, Thanks!  

## 2014-04-04 NOTE — Telephone Encounter (Signed)
Ok, i have sent a prescription to [facility].

## 2014-04-05 NOTE — Telephone Encounter (Signed)
Pt advised.

## 2014-04-05 NOTE — Telephone Encounter (Signed)
Requested call back to discuss.  

## 2014-04-06 ENCOUNTER — Ambulatory Visit (INDEPENDENT_AMBULATORY_CARE_PROVIDER_SITE_OTHER): Payer: Medicare Other | Admitting: Critical Care Medicine

## 2014-04-06 ENCOUNTER — Encounter: Payer: Self-pay | Admitting: Nurse Practitioner

## 2014-04-06 ENCOUNTER — Encounter: Payer: Self-pay | Admitting: Critical Care Medicine

## 2014-04-06 VITALS — BP 132/74 | HR 67 | Temp 97.2°F | Ht 61.0 in | Wt 167.4 lb

## 2014-04-06 DIAGNOSIS — Z23 Encounter for immunization: Secondary | ICD-10-CM | POA: Diagnosis not present

## 2014-04-06 DIAGNOSIS — I70219 Atherosclerosis of native arteries of extremities with intermittent claudication, unspecified extremity: Secondary | ICD-10-CM

## 2014-04-06 DIAGNOSIS — R1310 Dysphagia, unspecified: Secondary | ICD-10-CM | POA: Diagnosis not present

## 2014-04-06 DIAGNOSIS — J8410 Pulmonary fibrosis, unspecified: Secondary | ICD-10-CM

## 2014-04-06 NOTE — Assessment & Plan Note (Signed)
Pulm fibrosis, basilar predominance. Prob aspiration. Hx of esoph stricture with dilation 2003 per dr Deatra Ina Plan No pulmonary med changes Referral to GI for poss esoph dilation Flu vaccine was given

## 2014-04-06 NOTE — Patient Instructions (Signed)
Referral to GI will be made for swallowing No medication changes Flu vaccine was given Return 6 months

## 2014-04-06 NOTE — Progress Notes (Signed)
Subjective:    Patient ID: Tammy Hughes, female    DOB: 07-12-1933, 80 y.o.   MRN: [id number]  HPI  04/06/2014 Chief Complaint  Patient presents with  . 4 month follow up    Breathing doing well overall.  Has noticed sore throat and nonprod cough x 3 days.  No SOB, wheezing, or chest tightness/pain.  No oxygen needed. No real issues with dyspnea. If coughs is an issue.  Now notes some nonprod cough, and sore throat.  Occ diff with swallowing, large pills.  Occ aspirates.  No indigestion on prilosec. Occ gas with certain salads.     Review of Systems  Constitutional: Negative for unexpected weight change.  HENT: Negative for congestion, dental problem, nosebleeds, sinus pressure, sneezing and trouble swallowing.   Eyes: Negative for redness and itching.  Respiratory: Negative for chest tightness.   Cardiovascular: Positive for leg swelling. Negative for palpitations.       Hand and feet  Gastrointestinal: Positive for abdominal distention. Negative for nausea and vomiting.       Heartburn and indigestion  Genitourinary: Negative for dysuria.  Hematological: Does not bruise/bleed easily.  Psychiatric/Behavioral: Negative for dysphoric mood. The patient is not nervous/anxious.        Objective:   Physical Exam  Filed Vitals:   04/06/14 0929  BP: 132/74  Pulse: 67  Temp: 97.2 F (36.2 C)  TempSrc: Oral  Height: 5\' 1"  (1.549 m)  Weight: 167 lb 6.4 oz (75.932 kg)  SpO2: 94%    Gen: Pleasant, well-nourished, in no distress,  normal affect  ENT: No lesions,  mouth clear,  oropharynx clear, no postnasal drip  Neck: No JVD, no TMG, no carotid bruits  Lungs: No use of accessory muscles, no dullness to percussion, dry rales bibasilar  Cardiovascular: RRR, heart sounds normal, no murmur or gallops, no peripheral edema  Abdomen: soft and NT, no HSM,  BS normal  Musculoskeletal: No deformities, no cyanosis or clubbing  Neuro: alert, non focal  Skin: Warm, no lesions  or rashes  No results found.     Assessment & Plan:   Postinflammatory pulmonary fibrosis Pulm fibrosis, basilar predominance. Prob aspiration. Hx of esoph stricture with dilation 2003 per dr Deatra Ina Plan No pulmonary med changes Referral to GI for poss esoph dilation Flu vaccine was given    Updated Medication List Outpatient Encounter Prescriptions as of 04/06/2014  Medication Sig  . Calcium Carbonate-Vit D-Min (CALCIUM 600 + MINERALS) 600-200 MG-UNIT TABS Take 1 tablet by mouth daily.    . diphenoxylate-atropine (LOMOTIL) 2.5-0.025 MG per tablet Take 1-2 tablets by mouth every 6 (six) hours as needed for diarrhea or loose stools.  . furosemide (LASIX) 20 MG tablet TAKE 1 TABLET BY MOUTH DAILY AS DIRECTED  . HYDROcodone-acetaminophen (VICODIN) 5-500 MG per tablet Take 1 tablet by mouth every 4 (four) hours as needed. For pain  . hydrocortisone (ANUSOL-HC) 25 MG suppository Place rectally. As needed for bleeding   . hyoscyamine (LEVBID) 0.375 MG 12 hr tablet Take 0.375 mg by mouth 2 (two) times daily as needed. As needed for abdominal pain   . Insulin Aspart Prot & Aspart (NOVOLOG MIX 70/30 FLEXPEN) (70-30) 100 UNIT/ML Pen INJECT 70 UNITS            SUBCUTANEOUSLY EVERY DAY   WITH BREAKFAST AND 60 UNITSWITH THE EVENING MEAL  . Insulin Pen Needle 31G X 8 MM MISC USE AS DIRECTED WITH INSULIN TWICE DAILY Dx: 250.01  . omeprazole (PRILOSEC)  20 MG capsule Take 1 capsule (20 mg total) by mouth 2 (two) times daily before a meal.  . triamcinolone cream (KENALOG) 0.1 % Apply 1 application topically 4 (four) times daily. As needed for itching  . rosuvastatin (CRESTOR) 10 MG tablet Take 1 tablet (10 mg total) by mouth daily.  . [DISCONTINUED] methocarbamol (ROBAXIN) 500 MG tablet Take 1 tablet (500 mg total) by mouth at bedtime. For leg cramps

## 2014-04-07 ENCOUNTER — Telehealth: Payer: Self-pay | Admitting: Endocrinology

## 2014-04-07 NOTE — Telephone Encounter (Signed)
Have we received the [employer] request forms please call Wilhemena Durie at [employer] # [id number] ref # [id number]

## 2014-04-07 NOTE — Telephone Encounter (Signed)
Form received and placed on Md's Desk.

## 2014-04-14 ENCOUNTER — Ambulatory Visit (INDEPENDENT_AMBULATORY_CARE_PROVIDER_SITE_OTHER): Payer: Medicare Other | Admitting: Nurse Practitioner

## 2014-04-14 ENCOUNTER — Encounter: Payer: Self-pay | Admitting: Nurse Practitioner

## 2014-04-14 VITALS — BP 148/62 | HR 68 | Ht 61.0 in | Wt 166.0 lb

## 2014-04-14 DIAGNOSIS — R1310 Dysphagia, unspecified: Secondary | ICD-10-CM | POA: Diagnosis not present

## 2014-04-14 DIAGNOSIS — I70219 Atherosclerosis of native arteries of extremities with intermittent claudication, unspecified extremity: Secondary | ICD-10-CM | POA: Diagnosis not present

## 2014-04-14 NOTE — Patient Instructions (Signed)
You have been scheduled for an endoscopy. Please follow written instructions given to you at your visit today. If you use inhalers (even only as needed), please bring them with you on the day of your procedure. Your physician has requested that you go to www.[email or web address].com and enter the access code given to you at your visit today. This web site gives a general overview about your procedure. However, you should still follow specific instructions given to you by our office regarding your preparation for the procedure.  We discussed the importance of eating slowly, taking small bites of food, chewing well and consuming adequate amounts of fluid in between bites to avoid food impaction.

## 2014-04-14 NOTE — Progress Notes (Signed)
HPI :  Tammy Hughes is a 80 year old female referred by Dr. Asencion Noble for evaluation of dysphagia. She had a partial esophageal stricture of the distal esophagus in 2003, s/p Savary dilation. She hasn't been seen here in many years.    Tammy Hughes reports "strangling" with meals over the last few months. She admits to eating too fast most days. Meat, as well as water can lead to coughing and sensation of "strangling" No significant reflux unless she eats lettuce. Takes BID PPI  Past Medical History  Diagnosis Date  . DIABETES MELLITUS, TYPE I 02/23/2007  . HYPERCHOLESTEROLEMIA 02/01/2008  . ANXIETY 03/08/2008  . PERIPHERAL NEUROPATHY 02/23/2007  . HYPERTENSION 03/08/2008  . GERD 02/23/2007  . DIVERTICULOSIS, COLON 03/08/2008  . RECTAL BLEEDING 03/21/2010  . OSTEOARTHRITIS 02/23/2007  . OSTEOPOROSIS 02/23/2007  . Urolithiasis     Family History  Problem Relation Age of Onset  . Cancer Father     Prostate Cancer  . Cancer Sister     Breast Cancer  . Diabetes Sister   . Diabetes Sister    History  Substance Use Topics  . Smoking status: Never Smoker   . Smokeless tobacco: Not on file  . Alcohol Use: No   Current Outpatient Prescriptions  Medication Sig Dispense Refill  . Calcium Carbonate-Vit D-Min (CALCIUM 600 + MINERALS) 600-200 MG-UNIT TABS Take 1 tablet by mouth daily.        . diphenoxylate-atropine (LOMOTIL) 2.5-0.025 MG per tablet Take 1-2 tablets by mouth every 6 (six) hours as needed for diarrhea or loose stools.  30 tablet  0  . furosemide (LASIX) 20 MG tablet TAKE 1 TABLET BY MOUTH DAILY AS DIRECTED  90 tablet  prn  . HYDROcodone-acetaminophen (VICODIN) 5-500 MG per tablet Take 1 tablet by mouth every 4 (four) hours as needed. For pain  120 tablet  0  . hydrocortisone (ANUSOL-HC) 25 MG suppository Place rectally. As needed for bleeding       . hyoscyamine (LEVBID) 0.375 MG 12 hr tablet Take 0.375 mg by mouth 2 (two) times daily as needed. As needed for abdominal pain       .  Insulin Aspart Prot & Aspart (NOVOLOG MIX 70/30 FLEXPEN) (70-30) 100 UNIT/ML Pen INJECT 70 UNITS            SUBCUTANEOUSLY EVERY DAY   WITH BREAKFAST AND 60 UNITSWITH THE EVENING MEAL  40 pen  1  . Insulin Pen Needle 31G X 8 MM MISC USE AS DIRECTED WITH INSULIN TWICE DAILY Dx: 250.01  200 each  prn  . omeprazole (PRILOSEC) 20 MG capsule Take 1 capsule (20 mg total) by mouth 2 (two) times daily before a meal.  180 capsule  1  . rosuvastatin (CRESTOR) 10 MG tablet Take 1 tablet (10 mg total) by mouth daily.  90 tablet  3  . triamcinolone cream (KENALOG) 0.1 % Apply 1 application topically 4 (four) times daily. As needed for itching  45 g  2   No current facility-administered medications for this visit.   Allergies  Allergen Reactions  . Pirfenidone Diarrhea and Nausea And Vomiting  . Aspirin     rash   Review of Systems: Positive for arthritis, back pain, confusion, cough, itching, night sweats, skin rash, sleeping problems, sore throat, and swelling of feet / legs . All other systems reviewed and negative except where noted in HPI.   Physical Exam: BP 148/62  Pulse 68  Ht 5\' 1"  (1.549 m)  Wt  166 lb (75.297 kg)  BMI 31.38 kg/m2 Constitutional: Pleasant,well-developed, black female in no acute distress. HEENT: Normocephalic and atraumatic. Conjunctivae are normal. No scleral icterus. Neck supple.  Cardiovascular: Normal rate, regular rhythm.  Pulmonary/chest: Effort normal and breath sounds normal. No wheezing, rales or rhonchi. Abdominal: Soft, nondistended, nontender. Bowel sounds active throughout. There are no masses palpable. No hepatomegaly. Extremities: no edema Lymphadenopathy: No cervical adenopathy noted. Neurological: Alert and oriented to person place and time. Skin: Skin is warm and dry. No rashes noted. Psychiatric: Normal mood and affect. Behavior is normal.   ASSESSMENT AND PLAN:   87. 80 year old female with several month history of dysphagia to both solids and  liquids. She had an esophageal strictures dilated in 2003. Rule out recurrent stricture. This could just be dysmotility exacerbated by eating too quickly. We discussed options including barium swallow with tablet vrs proceeding with EGD. Tammy Hughes will be scheduled for EGD with probable dilation by Dr. Deatra Ina. The benefits, risks, and potential complications of EGD with possible biopsies and/or dilation were discussed with the Tammy Hughes and she agrees to proceed.   2. Pulmonary fibrosis / DM

## 2014-04-15 ENCOUNTER — Encounter: Payer: Self-pay | Admitting: Nurse Practitioner

## 2014-04-15 DIAGNOSIS — R1310 Dysphagia, unspecified: Secondary | ICD-10-CM | POA: Insufficient documentation

## 2014-04-18 ENCOUNTER — Telehealth: Payer: Self-pay | Admitting: Endocrinology

## 2014-04-18 NOTE — Progress Notes (Signed)
Reviewed and agree with management. Robert D. Kaplan, M.D., FACG  

## 2014-04-18 NOTE — Telephone Encounter (Signed)
Form received. Waiting on signature from MD.

## 2014-04-18 NOTE — Telephone Encounter (Signed)
Did we receive a diabetic shoe request form

## 2014-05-31 ENCOUNTER — Other Ambulatory Visit (INDEPENDENT_AMBULATORY_CARE_PROVIDER_SITE_OTHER): Payer: Medicare Other

## 2014-05-31 ENCOUNTER — Encounter: Payer: Self-pay | Admitting: Gastroenterology

## 2014-05-31 ENCOUNTER — Ambulatory Visit (AMBULATORY_SURGERY_CENTER): Payer: Medicare Other | Admitting: Gastroenterology

## 2014-05-31 ENCOUNTER — Other Ambulatory Visit: Payer: Self-pay

## 2014-05-31 VITALS — BP 164/75 | HR 50 | Temp 97.1°F | Resp 16 | Ht 61.0 in | Wt 166.0 lb

## 2014-05-31 DIAGNOSIS — K3189 Other diseases of stomach and duodenum: Secondary | ICD-10-CM

## 2014-05-31 DIAGNOSIS — R1310 Dysphagia, unspecified: Secondary | ICD-10-CM | POA: Diagnosis not present

## 2014-05-31 DIAGNOSIS — K625 Hemorrhage of anus and rectum: Secondary | ICD-10-CM | POA: Diagnosis not present

## 2014-05-31 DIAGNOSIS — K319 Disease of stomach and duodenum, unspecified: Secondary | ICD-10-CM | POA: Diagnosis not present

## 2014-05-31 DIAGNOSIS — I10 Essential (primary) hypertension: Secondary | ICD-10-CM | POA: Diagnosis not present

## 2014-05-31 DIAGNOSIS — K222 Esophageal obstruction: Secondary | ICD-10-CM | POA: Diagnosis not present

## 2014-05-31 DIAGNOSIS — C169 Malignant neoplasm of stomach, unspecified: Secondary | ICD-10-CM | POA: Diagnosis not present

## 2014-05-31 DIAGNOSIS — E119 Type 2 diabetes mellitus without complications: Secondary | ICD-10-CM | POA: Diagnosis not present

## 2014-05-31 LAB — GLUCOSE, CAPILLARY
GLUCOSE-CAPILLARY: 107 mg/dL — AB (ref 70–99)
Glucose-Capillary: 114 mg/dL — ABNORMAL HIGH (ref 70–99)

## 2014-05-31 LAB — CBC WITH DIFFERENTIAL/PLATELET
BASOS ABS: 0.1 10*3/uL (ref 0.0–0.1)
Basophils Relative: 1.6 % (ref 0.0–3.0)
EOS ABS: 0.2 10*3/uL (ref 0.0–0.7)
Eosinophils Relative: 2.8 % (ref 0.0–5.0)
HCT: 40.1 % (ref 36.0–46.0)
HEMOGLOBIN: 12.9 g/dL (ref 12.0–15.0)
Lymphocytes Relative: 39.1 % (ref 12.0–46.0)
Lymphs Abs: 2.1 10*3/uL (ref 0.7–4.0)
MCHC: 32.2 g/dL (ref 30.0–36.0)
MCV: 89.5 fl (ref 78.0–100.0)
Monocytes Absolute: 0.4 10*3/uL (ref 0.1–1.0)
Monocytes Relative: 8.3 % (ref 3.0–12.0)
NEUTROS ABS: 2.6 10*3/uL (ref 1.4–7.7)
Neutrophils Relative %: 48.2 % (ref 43.0–77.0)
Platelets: 246 10*3/uL (ref 150.0–400.0)
RBC: 4.48 Mil/uL (ref 3.87–5.11)
RDW: 13.3 % (ref 11.5–15.5)
WBC: 5.4 10*3/uL (ref 4.0–10.5)

## 2014-05-31 MED ORDER — SODIUM CHLORIDE 0.9 % IV SOLN: 500.0000 mL | INTRAVENOUS | Status: DC

## 2014-05-31 NOTE — Patient Instructions (Signed)
Discharge instructions given. Biopsies taken. Contrast given in recovery room.  Lab work ordered. Dilatation diet given. Resume previous medications. YOU HAD AN ENDOSCOPIC PROCEDURE TODAY AT [facility]: Refer to the procedure report that was given to you for any specific questions about what was found during the examination.  If the procedure report does not answer your questions, please call your gastroenterologist to clarify.  If you requested that your care partner not be given the details of your procedure findings, then the procedure report has been included in a sealed envelope for you to review at your convenience later.  YOU SHOULD EXPECT: Some feelings of bloating in the abdomen. Passage of more gas than usual.  Walking can help get rid of the air that was put into your GI tract during the procedure and reduce the bloating. If you had a lower endoscopy (such as a colonoscopy or flexible sigmoidoscopy) you may notice spotting of blood in your stool or on the toilet paper. If you underwent a bowel prep for your procedure, then you may not have a normal bowel movement for a few days.  DIET: Your first meal following the procedure should be a light meal and then it is ok to progress to your normal diet.  A half-sandwich or bowl of soup is an example of a good first meal.  Heavy or fried foods are harder to digest and may make you feel nauseous or bloated.  Likewise meals heavy in dairy and vegetables can cause extra gas to form and this can also increase the bloating.  Drink plenty of fluids but you should avoid alcoholic beverages for 24 hours.  ACTIVITY: Your care partner should take you home directly after the procedure.  You should plan to take it easy, moving slowly for the rest of the day.  You can resume normal activity the day after the procedure however you should NOT DRIVE or use heavy machinery for 24 hours (because of the sedation medicines used during the test).     SYMPTOMS TO REPORT IMMEDIATELY: A gastroenterologist can be reached at any hour.  During normal business hours, 8:30 AM to 5:00 PM Monday through Friday, call [phone].  After hours and on weekends, please call the GI answering service at [phone] who will take a message and have the physician on call contact you.    Following upper endoscopy (EGD)  Vomiting of blood or coffee ground material  New chest pain or pain under the shoulder blades  Painful or persistently difficult swallowing  New shortness of breath  Fever of 100F or higher  Black, tarry-looking stools  FOLLOW UP: If any biopsies were taken you will be contacted by phone or by letter within the next 1-3 weeks.  Call your gastroenterologist if you have not heard about the biopsies in 3 weeks.  Our staff will call the home number listed on your records the next business day following your procedure to check on you and address any questions or concerns that you may have at that time regarding the information given to you following your procedure. This is a courtesy call and so if there is no answer at the home number and we have not heard from you through the emergency physician on call, we will assume that you have returned to your regular daily activities without incident.  SIGNATURES/CONFIDENTIALITY: You and/or your care partner have signed paperwork which will be entered into your electronic medical record.  These signatures attest to  the fact that that the information above on your After Visit Summary has been reviewed and is understood.  Full responsibility of the confidentiality of this discharge information lies with you and/or your care-partner.

## 2014-05-31 NOTE — Progress Notes (Signed)
Called to room to assist during endoscopic procedure.  Patient ID and intended procedure confirmed with present staff. Received instructions for my participation in the procedure from the performing physician.  

## 2014-05-31 NOTE — Op Note (Signed)
[place]  [address] [place], [id number]   ENDOSCOPY PROCEDURE REPORT  PATIENT: Tammy, Hughes  MR#: [id number] BIRTHDATE: 01/27/1933 , 81  yrs. old GENDER: female ENDOSCOPIST: Inda Castle, MD REFERRED BY: PROCEDURE DATE:  05/31/2014 PROCEDURE:  EGD w/ biopsy and EGD w/ biopsy ASA CLASS:     Class III INDICATIONS:  dysphagia. MEDICATIONS: Monitored anesthesia care and Propofol 150 mg IV TOPICAL ANESTHETIC:  DESCRIPTION OF PROCEDURE: After the risks benefits and alternatives of the procedure were thoroughly explained, informed consent was obtained.  The LB [id number] endoscope was introduced through the mouth and advanced to the third portion of the duodenum , Without limitations.  The instrument was slowly withdrawn as the mucosa was fully examined.    ESOPHAGUS: There was a short stricture at the gastroesophageal junction.  The stricture was easily traversable.   The stricture was dilated using a 75mm (54Fr) Maloney dilator.  Marland Kitchen   STOMACH: A polypoid shaped mass measuring 3 X 4cm in size with friable surfaces was found in the gastric fundus along the lesser curvature.  this was a discrete area of prominent mucosa suggestive of a mass.  Multiple biopsies were performed. multiple benign appearing polyps measuring 1-2 mm were seen in the gastric fundus and cardia. Retroflexed views revealed as previously described. remainder of the exam was normal. The scope was then withdrawn from the patient and the procedure completed.  COMPLICATIONS: There were no immediate complications.  ENDOSCOPIC IMPRESSION: 1. discrete mucosal left Hilda Blades (probable mass) in the gastric fundus along the lesser curvature 2.  early esophageal stricture?"status post Maloney dilation 3.  Benign-appearing gastric polyps  RECOMMENDATIONS: 1.  await biopsy findings 2.  Check CBC and comprehensive metabolic profile 3.  CT of the chest and abdomen and pelvis  REPEAT  EXAM:  eSigned:  Inda Castle, MD 05/31/2014 3:42 PM    FW:YOVZCHY Johnette Abraham Joya Gaskins, MD and Donavan Foil, MD  PATIENT NAME:  Tammy, Hughes MR#: [id number]

## 2014-06-01 ENCOUNTER — Telehealth: Payer: Self-pay

## 2014-06-01 ENCOUNTER — Other Ambulatory Visit: Payer: Self-pay

## 2014-06-01 ENCOUNTER — Telehealth: Payer: Self-pay | Admitting: *Deleted

## 2014-06-01 DIAGNOSIS — R222 Localized swelling, mass and lump, trunk: Secondary | ICD-10-CM

## 2014-06-01 DIAGNOSIS — K3189 Other diseases of stomach and duodenum: Secondary | ICD-10-CM

## 2014-06-01 LAB — COMPREHENSIVE METABOLIC PANEL
ALK PHOS: 66 U/L (ref 39–117)
ALT: 45 U/L — AB (ref 0–35)
AST: 48 U/L — AB (ref 0–37)
Albumin: 3.7 g/dL (ref 3.5–5.2)
BUN: 15 mg/dL (ref 6–23)
CALCIUM: 9.5 mg/dL (ref 8.4–10.5)
CHLORIDE: 108 meq/L (ref 96–112)
CO2: 28 mEq/L (ref 19–32)
CREATININE: 0.9 mg/dL (ref 0.4–1.2)
GFR: 74.77 mL/min (ref >60.00)
Glucose, Bld: 121 mg/dL — ABNORMAL HIGH (ref 70–99)
Potassium: 3.6 mEq/L (ref 3.5–5.1)
Sodium: 142 mEq/L (ref 135–145)
Total Bilirubin: 0.6 mg/dL (ref 0.2–1.2)
Total Protein: 7.5 g/dL (ref 6.0–8.3)

## 2014-06-01 NOTE — Telephone Encounter (Signed)
  Follow up Call-  Call back number 05/31/2014  Post procedure Call Back phone  # [phone]  Permission to leave phone message Yes     Patient questions:  Do you have a fever, pain , or abdominal swelling? No. Pain Score  0 *  Have you tolerated food without any problems? Yes.    Have you been able to return to your normal activities? Yes.    Do you have any questions about your discharge instructions: Diet   No. Medications  No. Follow up visit  No.  Do you have questions or concerns about your Care? No.  Actions: * If pain score is 4 or above: No action needed, pain <4.

## 2014-06-01 NOTE — Telephone Encounter (Signed)
Patient contacted for scheduling and instructions on CT chest and abdomen. She agrees to an appointment 06/08/14 at 11:30. She was given her contrast when in the [facility] 05/31/14. Instructed to drink 1 bottle at 9:30 am and the second bottle at 10:30 am. She is to arrive at the [facility] between 11:00 and 11:15 fasting except for the contrast. Patient expresses understanding and says she has written her instructions down.

## 2014-06-02 ENCOUNTER — Telehealth: Payer: Self-pay | Admitting: Gastroenterology

## 2014-06-02 ENCOUNTER — Telehealth: Payer: Self-pay

## 2014-06-02 ENCOUNTER — Other Ambulatory Visit: Payer: Self-pay

## 2014-06-02 DIAGNOSIS — C169 Malignant neoplasm of stomach, unspecified: Secondary | ICD-10-CM

## 2014-06-02 NOTE — Telephone Encounter (Signed)
noted 

## 2014-06-02 NOTE — Telephone Encounter (Signed)
Referrals to [facility] and [facility]cology. Records faxed to [facility].

## 2014-06-02 NOTE — Telephone Encounter (Signed)
Explained to patient that biopsies showed cancer cells in the stomach.  Plan to make appointments at oncology and general surgery.  CT scan is pending.

## 2014-06-06 NOTE — Telephone Encounter (Signed)
Information for referrals sent to [facility]. Confirmed information was received.

## 2014-06-06 NOTE — Telephone Encounter (Signed)
-----   Message from Inda Castle, MD sent at 06/02/2014  8:56 AM EST ----- New diagnosis of stomach adenoca. CT scan ordered. My office will make app'ts

## 2014-06-08 ENCOUNTER — Encounter (HOSPITAL_COMMUNITY): Payer: Self-pay

## 2014-06-08 ENCOUNTER — Ambulatory Visit (HOSPITAL_COMMUNITY)
Admission: RE | Admit: 2014-06-08 | Discharge: 2014-06-08 | Disposition: A | Discharge status: Home / Self Care | Payer: Medicare Other | Source: Ambulatory Visit | Attending: Gastroenterology | Admitting: Gastroenterology

## 2014-06-08 DIAGNOSIS — K3189 Other diseases of stomach and duodenum: Secondary | ICD-10-CM

## 2014-06-08 DIAGNOSIS — C169 Malignant neoplasm of stomach, unspecified: Secondary | ICD-10-CM | POA: Diagnosis not present

## 2014-06-08 DIAGNOSIS — R222 Localized swelling, mass and lump, trunk: Secondary | ICD-10-CM | POA: Insufficient documentation

## 2014-06-08 DIAGNOSIS — J849 Interstitial pulmonary disease, unspecified: Secondary | ICD-10-CM | POA: Insufficient documentation

## 2014-06-08 DIAGNOSIS — K319 Disease of stomach and duodenum, unspecified: Secondary | ICD-10-CM | POA: Insufficient documentation

## 2014-06-08 MED ORDER — IOHEXOL 300 MG/ML  SOLN
100.0000 mL | Freq: Once | INTRAMUSCULAR | Status: AC | PRN
  Administered 2014-06-08: 100 mL via INTRAVENOUS

## 2014-06-13 DIAGNOSIS — C169 Malignant neoplasm of stomach, unspecified: Secondary | ICD-10-CM | POA: Diagnosis not present

## 2014-06-14 ENCOUNTER — Encounter: Payer: Self-pay | Admitting: Oncology

## 2014-06-14 ENCOUNTER — Ambulatory Visit (HOSPITAL_BASED_OUTPATIENT_CLINIC_OR_DEPARTMENT_OTHER): Payer: Medicare Other | Admitting: Oncology

## 2014-06-14 ENCOUNTER — Ambulatory Visit: Payer: Medicare Other

## 2014-06-14 VITALS — BP 137/47 | HR 65 | Temp 97.6°F | Resp 18 | Ht 61.0 in | Wt 163.5 lb

## 2014-06-14 DIAGNOSIS — C165 Malignant neoplasm of lesser curvature of stomach, unspecified: Secondary | ICD-10-CM | POA: Diagnosis not present

## 2014-06-14 DIAGNOSIS — C169 Malignant neoplasm of stomach, unspecified: Secondary | ICD-10-CM

## 2014-06-14 NOTE — Progress Notes (Signed)
Tammy Hughes Consult   Referring MD: Denay Pleitez 80 y.o.  1933-01-04    Reason for Referral: Gastric cancer    HPI: She has a history of an esophageal stricture and underwent a dilatation procedure in 2003. She has to clear her throat frequently. This has been a chronic problem. She recently developed "choking" episodes after eating liquids and solids. She was referred to Dr. Deatra Ina.  She was taken to an upper endoscopy procedure 05/31/2014. A stricture was noted at the gastroesophageal junction. The stricture was dilated. A polypoid mass measured 3 x 4 cm in the gastric fundus along the lesser curvature. Multiple biopsies were performed. Multiple benign-appearing polyps measuring 1-2 mm were noted in the gastric fundus and cardia. The pathology [id number]) revealed adenocarcinoma with signet ring features.  CTs of the chest and abdomen on 06/08/2014 revealed lower lobe fibrosis in the lungs. No suspicious pulmonary nodules. Small mediastinal lymph nodes including 1.57 m precarinal node with preservation of the fatty hilum. The liver appeared normal. No gastric mass was seen. Possible mild focal ulceration at the lesser curvature of the proximal gastric fundus. No suspicious abdominal lymphadenopathy. No ascites.  She was referred to Dr. Barry Dienes and is felt to be a candidate for surgical resection of the gastric tumor.  She has noted improvement in the "choking" spells since undergoing the esophageal dilatation procedure.   Past Medical History  Diagnosis Date  . HYPERCHOLESTEROLEMIA 02/01/2008  . ANXIETY 03/08/2008  . PERIPHERAL NEUROPATHY 02/23/2007  . HYPERTENSION 03/08/2008  . GERD 02/23/2007  . DIVERTICULOSIS, COLON 03/08/2008  . OSTEOARTHRITIS 02/23/2007  . OSTEOPOROSIS 02/23/2007  . Urolithiasis   . Allergy     SEASONAL  . Cataract     REMOVED BILATERAL  . DIABETES MELLITUS, TYPE I 02/23/2007    .   G5 P5   .   Pulmonary  fibrosis  Past Surgical History  Procedure Laterality Date  . Cholecystectomy  1995  . Esophagogastroduodenoscopy  06/08/2002    .  Right carpal tunnel surgery  Medications: Reviewed  Allergies:  Allergies  Allergen Reactions  . Pirfenidone Diarrhea and Nausea And Vomiting  . Aspirin     rash    Family history: She had 8 siblings. Her father had prostate cancer. A sister had breast cancer in her 61s. No other family history of cancer.  Social History:   She lives in [place]. She did domestic work in the past. She does not use cigarettes or alcohol. No transfusion history.    ROS:   Positives include: "Strangling "after liquids or solids-improved after the esophageal dilatation procedure  A complete ROS was otherwise negative.  Physical Exam:  Blood pressure 137/47, pulse 65, temperature 97.6 F (36.4 C), temperature source Oral, resp. rate 18, height 5\' 1"  (1.549 m), weight 163 lb 8 oz (74.163 kg).  HEENT: Oropharynx without visible mass, neck without mass Lungs: Clear bilaterally Cardiac: Regular rate and rhythm Abdomen: No hepatosplenomegaly, nontender, no mass  Vascular: No leg edema Lymph nodes: No cervical, supra-clavicular, axillary, or inguinal nodes Neurologic: The motor exam appears intact in the upper and lower extremities Skin: Multiple benign-appearing moles over the trunk Musculoskeletal: No spine tenderness   LAB:  CBC  Lab Results  Component Value Date   WBC 5.4 05/31/2014   HGB 12.9 05/31/2014   HCT 40.1 05/31/2014   MCV 89.5 05/31/2014   PLT 246.0 05/31/2014   NEUTROABS 2.6 05/31/2014     CMP  Component Value Date/Time   NA 142 05/31/2014 1603   K 3.6 05/31/2014 1603   CL 108 05/31/2014 1603   CO2 28 05/31/2014 1603   GLUCOSE 121* 05/31/2014 1603   BUN 15 05/31/2014 1603   CREATININE 0.9 05/31/2014 1603   CALCIUM 9.5 05/31/2014 1603   CALCIUM 9.9 07/23/2011 0858   PROT 7.5 05/31/2014 1603   ALBUMIN 3.7 05/31/2014 1603    AST 48* 05/31/2014 1603   ALT 45* 05/31/2014 1603   ALKPHOS 66 05/31/2014 1603   BILITOT 0.6 05/31/2014 1603   GFRNONAA 64.36 11/21/2009 1436   GFRAA 79 04/24/2007 0902     Imaging: As per history of present illness    Assessment/Plan:   1. Gastric cancer, status post an endoscopic biopsy of a lesser curvature mass on 05/31/2014 confirming adenocarcinoma  Staging CTs of the chest and abdomen on 06/08/2014 revealed no evidence of metastatic disease  2. Pulmonary fibrosis  3.   Esophageal stricture, status post a dilatation procedure 05/31/2014  4.   Diabetes   Disposition:   [name] has been diagnosed with gastric cancer. She appears to have localized disease based on the staging evaluation to date. I discussed treatment of gastric cancer with Mrs. Schoenberg and her family. We reviewed the benefits associated with perioperative chemotherapy and adjuvant chemotherapy/radiation.  I discussed the case with Dr. Barry Dienes. She appears to be a candidate for surgical resection. Mrs. Giraldo and her family are most comfortable proceeding with surgery and then considering adjuvant therapy based on the surgical findings and pathology.  She will contact Dr. Barry Dienes to schedule surgery.  I will see her after surgery to discuss adjuvant treatment options.  [place], [place] 06/14/2014, 4:11 PM

## 2014-06-14 NOTE — Progress Notes (Signed)
Checked in new patient with no issues.she has primary and 2ndary. She has not traveled and has appt crd.

## 2014-06-15 ENCOUNTER — Telehealth: Payer: Self-pay | Admitting: Oncology

## 2014-06-15 ENCOUNTER — Encounter: Payer: Self-pay | Admitting: Oncology

## 2014-06-15 ENCOUNTER — Other Ambulatory Visit (INDEPENDENT_AMBULATORY_CARE_PROVIDER_SITE_OTHER): Payer: Self-pay | Admitting: General Surgery

## 2014-06-15 DIAGNOSIS — C169 Malignant neoplasm of stomach, unspecified: Secondary | ICD-10-CM | POA: Insufficient documentation

## 2014-06-15 NOTE — Telephone Encounter (Signed)
S/w pt confirming MD visit per 12/01 POF, mailed sch to pt .Marland Kitchen... KJ

## 2014-06-17 ENCOUNTER — Telehealth: Payer: Self-pay | Admitting: Endocrinology

## 2014-06-17 NOTE — Telephone Encounter (Signed)
Ruel from [employer] would like to know if you received a fax for sencor Neuropathy

## 2014-07-11 ENCOUNTER — Encounter (HOSPITAL_COMMUNITY): Payer: Self-pay

## 2014-07-11 ENCOUNTER — Encounter (HOSPITAL_COMMUNITY)
Admission: RE | Admit: 2014-07-11 | Discharge: 2014-07-11 | Disposition: A | Discharge status: Home / Self Care | Payer: Medicare Other | Source: Ambulatory Visit | Attending: General Surgery | Admitting: General Surgery

## 2014-07-11 DIAGNOSIS — Z794 Long term (current) use of insulin: Secondary | ICD-10-CM | POA: Insufficient documentation

## 2014-07-11 DIAGNOSIS — I10 Essential (primary) hypertension: Secondary | ICD-10-CM | POA: Insufficient documentation

## 2014-07-11 DIAGNOSIS — Z01812 Encounter for preprocedural laboratory examination: Secondary | ICD-10-CM | POA: Insufficient documentation

## 2014-07-11 DIAGNOSIS — C169 Malignant neoplasm of stomach, unspecified: Secondary | ICD-10-CM | POA: Diagnosis not present

## 2014-07-11 DIAGNOSIS — E119 Type 2 diabetes mellitus without complications: Secondary | ICD-10-CM | POA: Insufficient documentation

## 2014-07-11 DIAGNOSIS — Z79899 Other long term (current) drug therapy: Secondary | ICD-10-CM | POA: Insufficient documentation

## 2014-07-11 HISTORY — DX: Major depressive disorder, single episode, unspecified: F32.9

## 2014-07-11 HISTORY — DX: Depression, unspecified: F32.A

## 2014-07-11 LAB — TYPE AND SCREEN
ABO/RH(D): O POS
Antibody Screen: NEGATIVE

## 2014-07-11 LAB — BASIC METABOLIC PANEL
ANION GAP: 7 (ref 5–15)
BUN: 13 mg/dL (ref 6–23)
CHLORIDE: 108 meq/L (ref 96–112)
CO2: 25 mmol/L (ref 19–32)
Calcium: 9.9 mg/dL (ref 8.4–10.5)
Creatinine, Ser: 0.91 mg/dL (ref 0.50–1.10)
GFR calc Af Amer: 68 mL/min — ABNORMAL LOW (ref >90)
GFR, EST NON AFRICAN AMERICAN: 58 mL/min — AB (ref >90)
Glucose, Bld: 183 mg/dL — ABNORMAL HIGH (ref 70–99)
Potassium: 3.8 mmol/L (ref 3.5–5.1)
SODIUM: 140 mmol/L (ref 135–145)

## 2014-07-11 LAB — CBC
HEMATOCRIT: 39.7 % (ref 36.0–46.0)
Hemoglobin: 13 g/dL (ref 12.0–15.0)
MCH: 28.8 pg (ref 26.0–34.0)
MCHC: 32.7 g/dL (ref 30.0–36.0)
MCV: 88 fL (ref 78.0–100.0)
PLATELETS: 265 10*3/uL (ref 150–400)
RBC: 4.51 MIL/uL (ref 3.87–5.11)
RDW: 13.3 % (ref 11.5–15.5)
WBC: 5.5 10*3/uL (ref 4.0–10.5)

## 2014-07-11 LAB — BLOOD GAS, ARTERIAL
Acid-Base Excess: 0.8 mmol/L (ref 0.0–2.0)
BICARBONATE: 24.8 meq/L — AB (ref 20.0–24.0)
DRAWN BY: 206361
FIO2: 0.21 %
O2 SAT: 96.5 %
PH ART: 7.421 (ref 7.350–7.450)
Patient temperature: 98.6
TCO2: 26 mmol/L (ref 0–100)
pCO2 arterial: 38.8 mmHg (ref 35.0–45.0)
pO2, Arterial: 91.5 mmHg (ref 80.0–100.0)

## 2014-07-11 LAB — URINALYSIS, ROUTINE W REFLEX MICROSCOPIC
BILIRUBIN URINE: NEGATIVE
Glucose, UA: 100 mg/dL — AB
Hgb urine dipstick: NEGATIVE
Ketones, ur: NEGATIVE mg/dL
NITRITE: NEGATIVE
PH: 5.5 (ref 5.0–8.0)
Protein, ur: NEGATIVE mg/dL
Specific Gravity, Urine: 1.01 (ref 1.005–1.030)
UROBILINOGEN UA: 0.2 mg/dL (ref 0.0–1.0)

## 2014-07-11 LAB — ABO/RH: ABO/RH(D): O POS

## 2014-07-11 LAB — URINE MICROSCOPIC-ADD ON

## 2014-07-11 NOTE — Pre-Procedure Instructions (Signed)
Tammy Hughes  07/11/2014   Your procedure is scheduled on:  07/20/14  Report to [facility] Admitting at 630 AM.  Call this number if you have problems the morning of surgery: [phone]   Remember:   Do not eat food or drink liquids after midnight.   Take these medicines the morning of surgery with A SIP OF WATER: hydrocodone,prilosec   Do not wear jewelry, make-up or nail polish.  Do not wear lotions, powders, or perfumes. You may wear deodorant.  Do not shave 48 hours prior to surgery. Men may shave face and neck.  Do not bring valuables to the hospital.  [facility] is not responsible                  for any belongings or valuables.               Contacts, dentures or bridgework may not be worn into surgery.  Leave suitcase in the car. After surgery it may be brought to your room.  For patients admitted to the hospital, discharge time is determined by your                treatment team.               Patients discharged the day of surgery will not be allowed to drive  home.  Name and phone number of your driver:   Special Instructions: Incentive Spirometry - Practice and bring it with you on the day of surgery.   Please read over the following fact sheets that you were given: Pain Booklet, Coughing and Deep Breathing, Blood Transfusion Information and Surgical Site Infection Prevention

## 2014-07-18 ENCOUNTER — Other Ambulatory Visit: Payer: Self-pay

## 2014-07-18 MED ORDER — ROSUVASTATIN CALCIUM 10 MG PO TABS: 10.0000 mg | ORAL_TABLET | Freq: Every day | ORAL | Status: DC

## 2014-07-19 MED ORDER — CEFAZOLIN SODIUM-DEXTROSE 2-3 GM-% IV SOLR
2.0000 g | INTRAVENOUS | Status: AC
  Administered 2014-07-20: 2 g via INTRAVENOUS
  Filled 2014-07-19: qty 50

## 2014-07-20 ENCOUNTER — Encounter (HOSPITAL_COMMUNITY): Payer: Self-pay | Admitting: Certified Registered Nurse Anesthetist

## 2014-07-20 ENCOUNTER — Inpatient Hospital Stay (HOSPITAL_COMMUNITY): Payer: Medicare Other | Admitting: Certified Registered Nurse Anesthetist

## 2014-07-20 ENCOUNTER — Encounter (HOSPITAL_COMMUNITY)
Admission: RE | Disposition: A | Discharge status: Rehabilitation Facility | Payer: Self-pay | Source: Ambulatory Visit | Attending: General Surgery

## 2014-07-20 ENCOUNTER — Inpatient Hospital Stay (HOSPITAL_COMMUNITY)
Admission: RE | Admit: 2014-07-20 | Discharge: 2014-07-28 | DRG: 326 | Disposition: A | Discharge status: Rehabilitation Facility | Payer: Medicare Other | Source: Ambulatory Visit | Attending: General Surgery | Admitting: General Surgery

## 2014-07-20 DIAGNOSIS — K219 Gastro-esophageal reflux disease without esophagitis: Secondary | ICD-10-CM | POA: Diagnosis present

## 2014-07-20 DIAGNOSIS — E1140 Type 2 diabetes mellitus with diabetic neuropathy, unspecified: Secondary | ICD-10-CM | POA: Diagnosis not present

## 2014-07-20 DIAGNOSIS — I369 Nonrheumatic tricuspid valve disorder, unspecified: Secondary | ICD-10-CM | POA: Diagnosis not present

## 2014-07-20 DIAGNOSIS — Z889 Allergy status to unspecified drugs, medicaments and biological substances status: Secondary | ICD-10-CM | POA: Diagnosis not present

## 2014-07-20 DIAGNOSIS — D002 Carcinoma in situ of stomach: Secondary | ICD-10-CM | POA: Diagnosis not present

## 2014-07-20 DIAGNOSIS — I10 Essential (primary) hypertension: Secondary | ICD-10-CM | POA: Diagnosis present

## 2014-07-20 DIAGNOSIS — K222 Esophageal obstruction: Secondary | ICD-10-CM | POA: Diagnosis present

## 2014-07-20 DIAGNOSIS — M1990 Unspecified osteoarthritis, unspecified site: Secondary | ICD-10-CM | POA: Diagnosis present

## 2014-07-20 DIAGNOSIS — E785 Hyperlipidemia, unspecified: Secondary | ICD-10-CM | POA: Diagnosis present

## 2014-07-20 DIAGNOSIS — J8410 Pulmonary fibrosis, unspecified: Secondary | ICD-10-CM | POA: Diagnosis present

## 2014-07-20 DIAGNOSIS — I472 Ventricular tachycardia: Secondary | ICD-10-CM | POA: Diagnosis not present

## 2014-07-20 DIAGNOSIS — E119 Type 2 diabetes mellitus without complications: Secondary | ICD-10-CM

## 2014-07-20 DIAGNOSIS — Z888 Allergy status to other drugs, medicaments and biological substances status: Secondary | ICD-10-CM

## 2014-07-20 DIAGNOSIS — F419 Anxiety disorder, unspecified: Secondary | ICD-10-CM | POA: Diagnosis not present

## 2014-07-20 DIAGNOSIS — E7800 Pure hypercholesterolemia, unspecified: Secondary | ICD-10-CM | POA: Diagnosis present

## 2014-07-20 DIAGNOSIS — K5790 Diverticulosis of intestine, part unspecified, without perforation or abscess without bleeding: Secondary | ICD-10-CM | POA: Diagnosis present

## 2014-07-20 DIAGNOSIS — I4729 Other ventricular tachycardia: Secondary | ICD-10-CM

## 2014-07-20 DIAGNOSIS — R778 Other specified abnormalities of plasma proteins: Secondary | ICD-10-CM | POA: Diagnosis not present

## 2014-07-20 DIAGNOSIS — G629 Polyneuropathy, unspecified: Secondary | ICD-10-CM | POA: Diagnosis present

## 2014-07-20 DIAGNOSIS — Z886 Allergy status to analgesic agent status: Secondary | ICD-10-CM | POA: Diagnosis not present

## 2014-07-20 DIAGNOSIS — E1169 Type 2 diabetes mellitus with other specified complication: Secondary | ICD-10-CM | POA: Diagnosis present

## 2014-07-20 DIAGNOSIS — Z903 Acquired absence of stomach [part of]: Secondary | ICD-10-CM

## 2014-07-20 DIAGNOSIS — E1159 Type 2 diabetes mellitus with other circulatory complications: Secondary | ICD-10-CM | POA: Diagnosis present

## 2014-07-20 DIAGNOSIS — C166 Malignant neoplasm of greater curvature of stomach, unspecified: Secondary | ICD-10-CM | POA: Diagnosis not present

## 2014-07-20 DIAGNOSIS — M810 Age-related osteoporosis without current pathological fracture: Secondary | ICD-10-CM | POA: Diagnosis present

## 2014-07-20 DIAGNOSIS — Z794 Long term (current) use of insulin: Secondary | ICD-10-CM

## 2014-07-20 DIAGNOSIS — I214 Non-ST elevation (NSTEMI) myocardial infarction: Secondary | ICD-10-CM | POA: Diagnosis not present

## 2014-07-20 DIAGNOSIS — R5381 Other malaise: Secondary | ICD-10-CM | POA: Diagnosis not present

## 2014-07-20 DIAGNOSIS — R7989 Other specified abnormal findings of blood chemistry: Secondary | ICD-10-CM | POA: Diagnosis not present

## 2014-07-20 DIAGNOSIS — C169 Malignant neoplasm of stomach, unspecified: Secondary | ICD-10-CM | POA: Diagnosis present

## 2014-07-20 HISTORY — PX: GASTRECTOMY: SHX58

## 2014-07-20 HISTORY — DX: Malignant neoplasm of stomach, unspecified: C16.9

## 2014-07-20 HISTORY — PX: LAPAROSCOPY: SHX197

## 2014-07-20 HISTORY — PX: GASTROJEJUNOSTOMY: SHX1697

## 2014-07-20 LAB — CBC
HEMATOCRIT: 35.7 % — AB (ref 36.0–46.0)
HEMOGLOBIN: 11.8 g/dL — AB (ref 12.0–15.0)
MCH: 29 pg (ref 26.0–34.0)
MCHC: 33.1 g/dL (ref 30.0–36.0)
MCV: 87.7 fL (ref 78.0–100.0)
Platelets: 234 10*3/uL (ref 150–400)
RBC: 4.07 MIL/uL (ref 3.87–5.11)
RDW: 13.3 % (ref 11.5–15.5)
WBC: 12.9 10*3/uL — ABNORMAL HIGH (ref 4.0–10.5)

## 2014-07-20 LAB — GLUCOSE, CAPILLARY
Glucose-Capillary: 173 mg/dL — ABNORMAL HIGH (ref 70–99)
Glucose-Capillary: 250 mg/dL — ABNORMAL HIGH (ref 70–99)
Glucose-Capillary: 233 mg/dL — ABNORMAL HIGH (ref 70–99)
Glucose-Capillary: 314 mg/dL — ABNORMAL HIGH (ref 70–99)

## 2014-07-20 LAB — CREATININE, SERUM
CREATININE: 0.92 mg/dL (ref 0.50–1.10)
GFR calc non Af Amer: 58 mL/min — ABNORMAL LOW (ref >90)
GFR, EST AFRICAN AMERICAN: 67 mL/min — AB (ref >90)

## 2014-07-20 SURGERY — GASTRECTOMY, TOTAL
Anesthesia: General | Site: Abdomen

## 2014-07-20 MED ORDER — SODIUM CHLORIDE 0.9 % IJ SOLN: 9.0000 mL | INTRAMUSCULAR | Status: DC | PRN

## 2014-07-20 MED ORDER — PROPOFOL 10 MG/ML IV BOLUS
INTRAVENOUS | Status: AC
  Filled 2014-07-20: qty 20

## 2014-07-20 MED ORDER — CEFAZOLIN SODIUM 1-5 GM-% IV SOLN
INTRAVENOUS | Status: AC
  Filled 2014-07-20: qty 50

## 2014-07-20 MED ORDER — NALOXONE HCL 0.4 MG/ML IJ SOLN
0.4000 mg | INTRAMUSCULAR | Status: DC | PRN
  Filled 2014-07-20: qty 1

## 2014-07-20 MED ORDER — OXYCODONE HCL 5 MG/5ML PO SOLN: 5.0000 mg | Freq: Once | ORAL | Status: DC | PRN

## 2014-07-20 MED ORDER — DEXAMETHASONE SODIUM PHOSPHATE 4 MG/ML IJ SOLN
INTRAMUSCULAR | Status: AC
  Filled 2014-07-20: qty 1

## 2014-07-20 MED ORDER — LACTATED RINGERS IV SOLN
INTRAVENOUS | Status: DC | PRN
  Administered 2014-07-20 (×2): via INTRAVENOUS

## 2014-07-20 MED ORDER — LIDOCAINE HCL (CARDIAC) 20 MG/ML IV SOLN
INTRAVENOUS | Status: AC
  Filled 2014-07-20: qty 5

## 2014-07-20 MED ORDER — FENTANYL CITRATE 0.05 MG/ML IJ SOLN
INTRAMUSCULAR | Status: AC
  Filled 2014-07-20: qty 5

## 2014-07-20 MED ORDER — MORPHINE SULFATE (PF) 1 MG/ML IV SOLN
INTRAVENOUS | Status: DC
  Administered 2014-07-20: 1 mg via INTRAVENOUS
  Administered 2014-07-20: 7 mg via INTRAVENOUS
  Administered 2014-07-21 (×2): 2 mg via INTRAVENOUS
  Administered 2014-07-21 (×2): 3 mg via INTRAVENOUS
  Administered 2014-07-22: 0.903 mg via INTRAVENOUS
  Administered 2014-07-22: 2 mg via INTRAVENOUS
  Administered 2014-07-22: 5 mg via INTRAVENOUS
  Administered 2014-07-22: 2 mg via INTRAVENOUS
  Administered 2014-07-22: 5 mg via INTRAVENOUS
  Administered 2014-07-22: 3 mg via INTRAVENOUS
  Administered 2014-07-23: 5 mg via INTRAVENOUS
  Administered 2014-07-23: 3 mg via INTRAVENOUS
  Administered 2014-07-23: 4.19 mg via INTRAVENOUS
  Administered 2014-07-23: 3 mg via INTRAVENOUS
  Administered 2014-07-24: 2 mg via INTRAVENOUS
  Administered 2014-07-24: 1 mg via INTRAVENOUS
  Administered 2014-07-24: 2 mg via INTRAVENOUS
  Administered 2014-07-24: 3.69 mg via INTRAVENOUS
  Administered 2014-07-24: 02:00:00 via INTRAVENOUS
  Administered 2014-07-24: 1 mg via INTRAVENOUS
  Administered 2014-07-25: 3 mg via INTRAVENOUS
  Filled 2014-07-20 (×3): qty 25

## 2014-07-20 MED ORDER — ONDANSETRON HCL 4 MG/2ML IJ SOLN
4.0000 mg | Freq: Four times a day (QID) | INTRAMUSCULAR | Status: DC | PRN
  Filled 2014-07-20: qty 2

## 2014-07-20 MED ORDER — BUPIVACAINE-EPINEPHRINE (PF) 0.25% -1:200000 IJ SOLN
INTRAMUSCULAR | Status: AC
  Filled 2014-07-20: qty 30

## 2014-07-20 MED ORDER — ONDANSETRON HCL 4 MG/2ML IJ SOLN: 4.0000 mg | Freq: Once | INTRAMUSCULAR | Status: DC | PRN

## 2014-07-20 MED ORDER — LIDOCAINE HCL (PF) 1 % IJ SOLN
INTRAMUSCULAR | Status: AC
Start: 2014-07-20 — End: 2014-07-20
  Filled 2014-07-20: qty 30

## 2014-07-20 MED ORDER — INSULIN ASPART 100 UNIT/ML ~~LOC~~ SOLN
SUBCUTANEOUS | Status: AC
  Filled 2014-07-20: qty 15

## 2014-07-20 MED ORDER — DEXAMETHASONE SODIUM PHOSPHATE 4 MG/ML IJ SOLN
INTRAMUSCULAR | Status: DC | PRN
  Administered 2014-07-20: 4 mg via INTRAVENOUS

## 2014-07-20 MED ORDER — STERILE WATER FOR IRRIGATION IR SOLN
Status: DC | PRN
  Administered 2014-07-20: 1000 mL

## 2014-07-20 MED ORDER — CEFAZOLIN SODIUM 1-5 GM-% IV SOLN
1.0000 g | Freq: Four times a day (QID) | INTRAVENOUS | Status: AC
  Administered 2014-07-20 – 2014-07-21 (×3): 1 g via INTRAVENOUS
  Filled 2014-07-20 (×2): qty 50

## 2014-07-20 MED ORDER — EPHEDRINE SULFATE 50 MG/ML IJ SOLN
INTRAMUSCULAR | Status: AC
  Filled 2014-07-20: qty 1

## 2014-07-20 MED ORDER — INSULIN ASPART 100 UNIT/ML ~~LOC~~ SOLN
SUBCUTANEOUS | Status: AC
  Filled 2014-07-20: qty 7

## 2014-07-20 MED ORDER — KCL IN DEXTROSE-NACL 20-5-0.45 MEQ/L-%-% IV SOLN
INTRAVENOUS | Status: AC
  Filled 2014-07-20: qty 1000

## 2014-07-20 MED ORDER — FENTANYL CITRATE 0.05 MG/ML IJ SOLN
INTRAMUSCULAR | Status: DC | PRN
  Administered 2014-07-20: 25 ug via INTRAVENOUS
  Administered 2014-07-20 (×4): 50 ug via INTRAVENOUS
  Administered 2014-07-20: 25 ug via INTRAVENOUS
  Administered 2014-07-20 (×2): 50 ug via INTRAVENOUS
  Administered 2014-07-20: 100 ug via INTRAVENOUS
  Administered 2014-07-20 (×2): 50 ug via INTRAVENOUS

## 2014-07-20 MED ORDER — BUPIVACAINE 0.25 % ON-Q PUMP DUAL CATH 300 ML
300.0000 mL | INJECTION | Status: DC
  Filled 2014-07-20: qty 300

## 2014-07-20 MED ORDER — LACTATED RINGERS IV SOLN
INTRAVENOUS | Status: DC | PRN
  Administered 2014-07-20: 08:00:00 via INTRAVENOUS

## 2014-07-20 MED ORDER — ROCURONIUM BROMIDE 50 MG/5ML IV SOLN
INTRAVENOUS | Status: AC
  Filled 2014-07-20: qty 2

## 2014-07-20 MED ORDER — DIPHENHYDRAMINE HCL 12.5 MG/5ML PO ELIX
12.5000 mg | ORAL_SOLUTION | Freq: Four times a day (QID) | ORAL | Status: DC | PRN
  Filled 2014-07-20: qty 5

## 2014-07-20 MED ORDER — PHENYLEPHRINE HCL 10 MG/ML IJ SOLN
INTRAMUSCULAR | Status: DC | PRN
  Administered 2014-07-20 (×5): 40 ug via INTRAVENOUS

## 2014-07-20 MED ORDER — SODIUM CHLORIDE 0.9 % IR SOLN
Status: DC | PRN
  Administered 2014-07-20: 1000 mL
  Administered 2014-07-20: 1
  Administered 2014-07-20: 1000 mL

## 2014-07-20 MED ORDER — ARTIFICIAL TEARS OP OINT
TOPICAL_OINTMENT | OPHTHALMIC | Status: DC | PRN
  Administered 2014-07-20: 1 via OPHTHALMIC

## 2014-07-20 MED ORDER — INSULIN ASPART 100 UNIT/ML ~~LOC~~ SOLN
0.0000 [IU] | SUBCUTANEOUS | Status: DC
  Administered 2014-07-20: 15 [IU] via SUBCUTANEOUS
  Administered 2014-07-20: 11 [IU] via SUBCUTANEOUS
  Administered 2014-07-20: 7 [IU] via SUBCUTANEOUS
  Administered 2014-07-21: 11 [IU] via SUBCUTANEOUS
  Administered 2014-07-21 (×2): 7 [IU] via SUBCUTANEOUS
  Administered 2014-07-21: 11 [IU] via SUBCUTANEOUS
  Administered 2014-07-21: 15 [IU] via SUBCUTANEOUS
  Administered 2014-07-22 (×3): 7 [IU] via SUBCUTANEOUS
  Administered 2014-07-22 (×2): 11 [IU] via SUBCUTANEOUS
  Administered 2014-07-22 – 2014-07-23 (×4): 7 [IU] via SUBCUTANEOUS
  Administered 2014-07-23: 11 [IU] via SUBCUTANEOUS
  Administered 2014-07-23: 4 [IU] via SUBCUTANEOUS
  Administered 2014-07-23 – 2014-07-24 (×2): 7 [IU] via SUBCUTANEOUS
  Administered 2014-07-24 (×3): 4 [IU] via SUBCUTANEOUS
  Administered 2014-07-24: 11 [IU] via SUBCUTANEOUS
  Administered 2014-07-24: 4 [IU] via SUBCUTANEOUS
  Administered 2014-07-25: 11 [IU] via SUBCUTANEOUS
  Administered 2014-07-25 (×2): 4 [IU] via SUBCUTANEOUS
  Administered 2014-07-25: 7 [IU] via SUBCUTANEOUS
  Administered 2014-07-26 (×2): 4 [IU] via SUBCUTANEOUS
  Administered 2014-07-26 (×3): 7 [IU] via SUBCUTANEOUS
  Administered 2014-07-26: 11 [IU] via SUBCUTANEOUS
  Administered 2014-07-27: 4 [IU] via SUBCUTANEOUS
  Administered 2014-07-27: 7 [IU] via SUBCUTANEOUS
  Administered 2014-07-27: 4 [IU] via SUBCUTANEOUS
  Administered 2014-07-27: 11 [IU] via SUBCUTANEOUS
  Administered 2014-07-27: 7 [IU] via SUBCUTANEOUS
  Administered 2014-07-27: 11 [IU] via SUBCUTANEOUS
  Administered 2014-07-28: 4 [IU] via SUBCUTANEOUS
  Administered 2014-07-28 (×2): 7 [IU] via SUBCUTANEOUS
  Administered 2014-07-28: 4 [IU] via SUBCUTANEOUS
  Filled 2014-07-20 (×44): qty 0.2

## 2014-07-20 MED ORDER — HEPARIN SODIUM (PORCINE) 5000 UNIT/ML IJ SOLN
5000.0000 [IU] | Freq: Three times a day (TID) | INTRAMUSCULAR | Status: DC
  Administered 2014-07-21 – 2014-07-24 (×9): 5000 [IU] via SUBCUTANEOUS
  Filled 2014-07-20 (×12): qty 1

## 2014-07-20 MED ORDER — ONDANSETRON HCL 4 MG/2ML IJ SOLN
INTRAMUSCULAR | Status: AC
  Filled 2014-07-20: qty 2

## 2014-07-20 MED ORDER — ARTIFICIAL TEARS OP OINT
TOPICAL_OINTMENT | OPHTHALMIC | Status: AC
  Filled 2014-07-20: qty 3.5

## 2014-07-20 MED ORDER — PHENYLEPHRINE HCL 10 MG/ML IJ SOLN
10.0000 mg | INTRAVENOUS | Status: DC | PRN
  Administered 2014-07-20: 20 ug/min via INTRAVENOUS

## 2014-07-20 MED ORDER — MORPHINE SULFATE (PF) 1 MG/ML IV SOLN
INTRAVENOUS | Status: AC
  Filled 2014-07-20: qty 25

## 2014-07-20 MED ORDER — LIDOCAINE HCL (CARDIAC) 20 MG/ML IV SOLN
INTRAVENOUS | Status: DC | PRN
  Administered 2014-07-20: 80 mg via INTRAVENOUS

## 2014-07-20 MED ORDER — GLYCOPYRROLATE 0.2 MG/ML IJ SOLN
INTRAMUSCULAR | Status: AC
  Filled 2014-07-20: qty 1

## 2014-07-20 MED ORDER — INSULIN GLARGINE 100 UNIT/ML ~~LOC~~ SOLN
8.0000 [IU] | Freq: Every day | SUBCUTANEOUS | Status: DC
  Administered 2014-07-20: 8 [IU] via SUBCUTANEOUS
  Filled 2014-07-20: qty 0.08

## 2014-07-20 MED ORDER — KCL IN DEXTROSE-NACL 20-5-0.45 MEQ/L-%-% IV SOLN
INTRAVENOUS | Status: DC
  Administered 2014-07-20: 100 mL via INTRAVENOUS
  Administered 2014-07-20 – 2014-07-26 (×7): via INTRAVENOUS
  Filled 2014-07-20 (×16): qty 1000

## 2014-07-20 MED ORDER — BUPIVACAINE ON-Q PAIN PUMP (FOR ORDER SET NO CHG)
INJECTION | Status: AC
Start: 2014-07-20 — End: 2014-07-23
  Filled 2014-07-20: qty 1

## 2014-07-20 MED ORDER — DIPHENHYDRAMINE HCL 50 MG/ML IJ SOLN
12.5000 mg | Freq: Four times a day (QID) | INTRAMUSCULAR | Status: DC | PRN
  Administered 2014-07-24: 12.5 mg via INTRAVENOUS
  Filled 2014-07-20: qty 0.25
  Filled 2014-07-20: qty 1

## 2014-07-20 MED ORDER — ONDANSETRON HCL 4 MG/2ML IJ SOLN
4.0000 mg | Freq: Four times a day (QID) | INTRAMUSCULAR | Status: DC | PRN
  Administered 2014-07-26: 4 mg via INTRAVENOUS
  Filled 2014-07-20: qty 2

## 2014-07-20 MED ORDER — ONDANSETRON HCL 4 MG PO TABS: 4.0000 mg | ORAL_TABLET | Freq: Four times a day (QID) | ORAL | Status: DC | PRN

## 2014-07-20 MED ORDER — EVICEL 5 ML EX KIT
PACK | CUTANEOUS | Status: DC | PRN
  Administered 2014-07-20: 1

## 2014-07-20 MED ORDER — NEOSTIGMINE METHYLSULFATE 10 MG/10ML IV SOLN
INTRAVENOUS | Status: DC | PRN
  Administered 2014-07-20: 3 mg via INTRAVENOUS

## 2014-07-20 MED ORDER — EVICEL 5 ML EX KIT
PACK | CUTANEOUS | Status: AC
  Filled 2014-07-20: qty 1

## 2014-07-20 MED ORDER — MIDAZOLAM HCL 5 MG/5ML IJ SOLN
INTRAMUSCULAR | Status: DC | PRN
  Administered 2014-07-20: 1 mg via INTRAVENOUS

## 2014-07-20 MED ORDER — PROPOFOL 10 MG/ML IV BOLUS
INTRAVENOUS | Status: DC | PRN
  Administered 2014-07-20: 150 mg via INTRAVENOUS

## 2014-07-20 MED ORDER — LIDOCAINE HCL 1 % IJ SOLN
INTRAMUSCULAR | Status: DC | PRN
  Administered 2014-07-20: 60 mL

## 2014-07-20 MED ORDER — PHENYLEPHRINE 40 MCG/ML (10ML) SYRINGE FOR IV PUSH (FOR BLOOD PRESSURE SUPPORT)
PREFILLED_SYRINGE | INTRAVENOUS | Status: AC
  Filled 2014-07-20: qty 10

## 2014-07-20 MED ORDER — MIDAZOLAM HCL 2 MG/2ML IJ SOLN
INTRAMUSCULAR | Status: AC
  Filled 2014-07-20: qty 2

## 2014-07-20 MED ORDER — SODIUM CHLORIDE 0.9 % IJ SOLN
INTRAMUSCULAR | Status: AC
  Filled 2014-07-20: qty 10

## 2014-07-20 MED ORDER — GLYCOPYRROLATE 0.2 MG/ML IJ SOLN
INTRAMUSCULAR | Status: DC | PRN
  Administered 2014-07-20: .4 mg via INTRAVENOUS

## 2014-07-20 MED ORDER — HYDROMORPHONE HCL 1 MG/ML IJ SOLN: 0.2500 mg | INTRAMUSCULAR | Status: DC | PRN

## 2014-07-20 MED ORDER — MORPHINE SULFATE 2 MG/ML IJ SOLN
1.0000 mg | INTRAMUSCULAR | Status: DC | PRN
  Administered 2014-07-26: 2 mg via INTRAVENOUS
  Filled 2014-07-20: qty 1

## 2014-07-20 MED ORDER — ROCURONIUM BROMIDE 100 MG/10ML IV SOLN
INTRAVENOUS | Status: DC | PRN
  Administered 2014-07-20: 10 mg via INTRAVENOUS
  Administered 2014-07-20: 40 mg via INTRAVENOUS
  Administered 2014-07-20: 10 mg via INTRAVENOUS
  Administered 2014-07-20: 5 mg via INTRAVENOUS

## 2014-07-20 MED ORDER — ONDANSETRON HCL 4 MG/2ML IJ SOLN
INTRAMUSCULAR | Status: DC | PRN
  Administered 2014-07-20: 4 mg via INTRAVENOUS

## 2014-07-20 MED ORDER — SUCCINYLCHOLINE CHLORIDE 20 MG/ML IJ SOLN
INTRAMUSCULAR | Status: AC
  Filled 2014-07-20: qty 1

## 2014-07-20 MED ORDER — DEXAMETHASONE SODIUM PHOSPHATE 10 MG/ML IJ SOLN
INTRAMUSCULAR | Status: AC
  Filled 2014-07-20: qty 1

## 2014-07-20 MED ORDER — OXYCODONE HCL 5 MG PO TABS: 5.0000 mg | ORAL_TABLET | Freq: Once | ORAL | Status: DC | PRN

## 2014-07-20 SURGICAL SUPPLY — 95 items
BAG URINE DRAINAGE (UROLOGICAL SUPPLIES) ×3 IMPLANT
BLADE SURG 10 STRL SS (BLADE) ×3 IMPLANT
BLADE SURG 15 STRL LF DISP TIS (BLADE) ×1 IMPLANT
BLADE SURG 15 STRL SS (BLADE) ×3
BLADE SURG ROTATE 9660 (MISCELLANEOUS) ×3 IMPLANT
CANISTER SUCTION 2500CC (MISCELLANEOUS) ×3 IMPLANT
CATH KIT ON-Q SILVERSOAK 7.5IN (CATHETERS) ×6 IMPLANT
CHLORAPREP W/TINT 26ML (MISCELLANEOUS) ×3 IMPLANT
CLIP LIGATING HEM O LOK PURPLE (MISCELLANEOUS) IMPLANT
CLIP LIGATING HEMO O LOK GREEN (MISCELLANEOUS) IMPLANT
CLIP LIGATING HEMOLOK MED (MISCELLANEOUS) IMPLANT
CLIP TI LARGE 6 (CLIP) IMPLANT
CLIP TI MEDIUM 24 (CLIP) IMPLANT
CLOSURE WOUND 1/2 X4 (GAUZE/BANDAGES/DRESSINGS) ×2
COVER MAYO STAND STRL (DRAPES) ×3 IMPLANT
COVER SURGICAL LIGHT HANDLE (MISCELLANEOUS) ×3 IMPLANT
DECANTER SPIKE VIAL GLASS SM (MISCELLANEOUS) IMPLANT
DRAIN PENROSE 1/2X36 STERILE (WOUND CARE) IMPLANT
DRAPE LAPAROSCOPIC ABDOMINAL (DRAPES) ×3 IMPLANT
DRAPE UTILITY XL STRL (DRAPES) IMPLANT
DRAPE WARM FLUID 44X44 (DRAPE) ×3 IMPLANT
DRSG COVADERM 4X10 (GAUZE/BANDAGES/DRESSINGS) ×3 IMPLANT
DRSG COVADERM 4X6 (GAUZE/BANDAGES/DRESSINGS) ×3 IMPLANT
DRSG COVADERM 4X8 (GAUZE/BANDAGES/DRESSINGS) IMPLANT
DRSG TEGADERM 4X4.75 (GAUZE/BANDAGES/DRESSINGS) ×6 IMPLANT
ELECT BLADE 6.5 EXT (BLADE) ×3 IMPLANT
ELECT CAUTERY BLADE 6.4 (BLADE) ×3 IMPLANT
ELECT REM PT RETURN 9FT ADLT (ELECTROSURGICAL) ×3
ELECTRODE REM PT RTRN 9FT ADLT (ELECTROSURGICAL) ×1 IMPLANT
GAUZE SPONGE 4X4 12PLY STRL (GAUZE/BANDAGES/DRESSINGS) IMPLANT
GLOVE BIO SURGEON STRL SZ 6 (GLOVE) ×12 IMPLANT
GLOVE BIO SURGEON STRL SZ 6.5 (GLOVE) ×8 IMPLANT
GLOVE BIO SURGEONS STRL SZ 6.5 (GLOVE) ×4
GLOVE BIOGEL PI IND STRL 6.5 (GLOVE) IMPLANT
GLOVE BIOGEL PI IND STRL 7.0 (GLOVE) ×2 IMPLANT
GLOVE BIOGEL PI IND STRL 7.5 (GLOVE) ×1 IMPLANT
GLOVE BIOGEL PI IND STRL 8 (GLOVE) ×2 IMPLANT
GLOVE BIOGEL PI INDICATOR 6.5 (GLOVE)
GLOVE BIOGEL PI INDICATOR 7.0 (GLOVE) ×4
GLOVE BIOGEL PI INDICATOR 7.5 (GLOVE) ×2
GLOVE BIOGEL PI INDICATOR 8 (GLOVE) ×4
GLOVE ECLIPSE 7.5 STRL STRAW (GLOVE) ×3 IMPLANT
GLOVE EUDERMIC 7 POWDERFREE (GLOVE) ×3 IMPLANT
GLOVE INDICATOR 6.5 STRL GRN (GLOVE) ×3 IMPLANT
GLOVE SS BIOGEL STRL SZ 6.5 (GLOVE) ×1 IMPLANT
GLOVE SUPERSENSE BIOGEL SZ 6.5 (GLOVE) ×2
GOWN STRL REUS W/ TWL LRG LVL3 (GOWN DISPOSABLE) ×4 IMPLANT
GOWN STRL REUS W/TWL 2XL LVL3 (GOWN DISPOSABLE) ×6 IMPLANT
GOWN STRL REUS W/TWL LRG LVL3 (GOWN DISPOSABLE) ×12
KIT BASIN OR (CUSTOM PROCEDURE TRAY) ×3 IMPLANT
KIT ROOM TURNOVER OR (KITS) ×3 IMPLANT
LIQUID BAND (GAUZE/BANDAGES/DRESSINGS) IMPLANT
NS IRRIG 1000ML POUR BTL (IV SOLUTION) ×6 IMPLANT
PACK GENERAL/GYN (CUSTOM PROCEDURE TRAY) ×3 IMPLANT
PAD ARMBOARD 7.5X6 YLW CONV (MISCELLANEOUS) ×6 IMPLANT
PENCIL BUTTON HOLSTER BLD 10FT (ELECTRODE) ×3 IMPLANT
RELOAD PROXIMATE 75MM BLUE (ENDOMECHANICALS) ×12 IMPLANT
SCISSORS LAP 5X35 DISP (ENDOMECHANICALS) ×3 IMPLANT
SET IRRIG TUBING LAPAROSCOPIC (IRRIGATION / IRRIGATOR) IMPLANT
SHEARS FOC LG CVD HARMONIC 17C (MISCELLANEOUS) IMPLANT
SLEEVE ENDOPATH XCEL 5M (ENDOMECHANICALS) ×3 IMPLANT
SPECIMEN JAR X LARGE (MISCELLANEOUS) IMPLANT
SPONGE GAUZE 4X4 12PLY STER LF (GAUZE/BANDAGES/DRESSINGS) ×3 IMPLANT
SPONGE LAP 18X18 X RAY DECT (DISPOSABLE) ×12 IMPLANT
STAPLER ECHELON FLEX (STAPLE) IMPLANT
STAPLER PROXIMATE 75MM BLUE (STAPLE) ×3 IMPLANT
STAPLER VISISTAT 35W (STAPLE) ×3 IMPLANT
STRIP CLOSURE SKIN 1/2X4 (GAUZE/BANDAGES/DRESSINGS) ×4 IMPLANT
SUCTION POOLE TIP (SUCTIONS) ×3 IMPLANT
SUT ETHILON 2 0 FS 18 (SUTURE) ×6 IMPLANT
SUT MNCRL AB 4-0 PS2 18 (SUTURE) ×3 IMPLANT
SUT PDS AB 1 TP1 96 (SUTURE) IMPLANT
SUT PDS AB 3-0 SH 27 (SUTURE) ×6 IMPLANT
SUT PDS II 0 TP-1 LOOPED 60 (SUTURE) IMPLANT
SUT PROLENE 2 0 CT2 30 (SUTURE) ×3 IMPLANT
SUT SILK 2 0 SH CR/8 (SUTURE) ×6 IMPLANT
SUT SILK 2 0 TIES 10X30 (SUTURE) ×3 IMPLANT
SUT SILK 2 0SH CR/8 30 (SUTURE) ×3 IMPLANT
SUT SILK 3 0 SH CR/8 (SUTURE) ×3 IMPLANT
SUT SILK 3 0 TIES 10X30 (SUTURE) ×3 IMPLANT
SYR BULB IRRIGATION 50ML (SYRINGE) ×3 IMPLANT
TAPE CLOTH SURG 4X10 WHT LF (GAUZE/BANDAGES/DRESSINGS) ×3 IMPLANT
TOWEL OR 17X24 6PK STRL BLUE (TOWEL DISPOSABLE) ×3 IMPLANT
TOWEL OR 17X26 10 PK STRL BLUE (TOWEL DISPOSABLE) ×3 IMPLANT
TRAY FOLEY CATH 14FRSI W/METER (CATHETERS) ×3 IMPLANT
TRAY LAPAROSCOPIC (CUSTOM PROCEDURE TRAY) ×3 IMPLANT
TROCAR XCEL BLUNT TIP 100MML (ENDOMECHANICALS) IMPLANT
TROCAR XCEL NON-BLD 11X100MML (ENDOMECHANICALS) IMPLANT
TROCAR XCEL NON-BLD 5MMX100MML (ENDOMECHANICALS) ×3 IMPLANT
TUBE CONNECTING 12'X1/4 (SUCTIONS) ×1
TUBE CONNECTING 12X1/4 (SUCTIONS) ×2 IMPLANT
TUBING INSUFFLATION (TUBING) ×3 IMPLANT
TUNNELER SHEATH ON-Q 16GX12 DP (PAIN MANAGEMENT) ×3 IMPLANT
WATER STERILE IRR 1000ML POUR (IV SOLUTION) IMPLANT
YANKAUER SUCT BULB TIP NO VENT (SUCTIONS) ×6 IMPLANT

## 2014-07-20 NOTE — Op Note (Signed)
PRE-OPERATIVE DIAGNOSIS: Gastric cancer cT2N0  POST-OPERATIVE DIAGNOSIS:  Same  PROCEDURE:  Procedure(s): Diagnostic laparoscopy with proximal gastrectomy and feeding jejunostomy  SURGEON:  Surgeon(s): Stark Klein, MD  ASSISTANT:  Fanny Skates, MD Chester Holstein, PA-S  ANESTHESIA:   general  DRAINS: Jejunostomy Tube, (OnQ) Blake drain(s) in the LUQ and OnQ   LOCAL MEDICATIONS USED:  BUPIVICAINE  and LIDOCAINE   SPECIMEN:  Source of Specimen:  proximal stomach and omentum  DISPOSITION OF SPECIMEN:  PATHOLOGY  COUNTS:  YES  DICTATION: .Dragon Dictation  PLAN OF CARE: Admit to inpatient   PATIENT DISPOSITION:  PACU - hemodynamically stable.  FINDINGS:  Small 2 cm tumor on lesser curve around 6 cm from GE junction  EBL: 200  PROCEDURE:  Patient was identified in the holding area and taken operating room where she was placed supine on operating room table. General anesthesia was induced. Foley catheter was placed. The right arm was tucked. The abdomen was prepped and draped in sterile fashion. A timeout was performed according to the surgical safety checklist. When all was correct, we continued.  The patient was placed into reverse Trendelenburg position and rotated to the right. The Optiview trocar was placed at the left costal margin.  The abdomen was insufflated.  The LUQ and pelvis were examined and there was no evidence of carcinomatosis.  A second trocar was placed in the upper midline to see the right liver since she had a previous lap chole.  No carcinomatosis was seen.    A midline incision was made in the upper abdomen and continued just below the umbilicus.  The subcutaneous tissues were divided with the cautery.  The adhesions to the RUQ were taken down with the cautery.  The Bookwalter retractor was placed for assistance with visualization. The stomach was identified and the tumor was seen on the lesser curve. The omentum was then taken down off: With the cautery  and harmonic scalpel. The stomach was completely mobilized.  The area for the proximal division was identified and the gastroepiploic was dissected away from the stomach wall. This was divided with a harmonic scalpel. The GIA-75 millimeter stapler was then used to divide the stomach just distal to the GE junction. 2 loads of the stapler were required.  The area for the distal division was identified an the gastroepiploic was taken as well with  the harmonic. The GIA-75 was used to tubularized the distal stomach.    The distal esophagus and stomach were mobilized. The proximal portion of the stomach was secured to the distal esophagus with 2-0 silk sutures. The NG tube was identified. A small opening was made in the distal esophagus and the proximal stomach. An anastomosis was created with a additional load of the GIA-75. The defect was closed after pulling the NG tube in the stomach with two 3-0 PDS sutures. The 2-0 silks were used to reinforce imbricate the staple line. Frozen sections were sent and were negative. The 19 Fr blake drain was placed behind the stomach. A red rubber 22 Fr J-tube was then advanced through the abdominal wall and left internal quadrant and placed into the jejunum with a pursestring suture. The the jejunum proximal to the J-tube site was imbricated with Wetzel sutures. The jejunum was then pexed to the abdominal wall with 2-0 silk sutures. The abdomen was irrigated. Sponge count was performed and was correct. The fascia was then closed using running #1 looped PDS suture after placement of the On-Q tunnelers. The skin was  irrigated and closed with staples.   Patient was awakened from anesthesia and taken to the PACU in stable condition. Needle, sponge, and instrument counts were correct x 2.

## 2014-07-20 NOTE — Anesthesia Postprocedure Evaluation (Signed)
  Anesthesia Post-op Note  Patient: Tammy Hughes  Procedure(s) Performed: Procedure(s): PROXIMAL GASTRECTOMY (N/A) LAPAROSCOPY DIAGNOSTIC (N/A) GASTROJEJUNOSTOMY (N/A)  Patient Location: PACU  Anesthesia Type: General   Level of Consciousness: awake, alert  and oriented  Airway and Oxygen Therapy: Patient Spontanous Breathing  Post-op Pain: moderate  Post-op Assessment: Post-op Vital signs reviewed  Post-op Vital Signs: Reviewed  Last Vitals:  Filed Vitals:   07/20/14 1420  BP: 138/51  Pulse: 59  Temp:   Resp: 17    Complications: No apparent anesthesia complications

## 2014-07-20 NOTE — Transfer of Care (Signed)
Immediate Anesthesia Transfer of Care Note  Patient: Tammy Hughes  Procedure(s) Performed: Procedure(s): PROXIMAL GASTRECTOMY (N/A) LAPAROSCOPY DIAGNOSTIC (N/A) GASTROJEJUNOSTOMY (N/A)  Patient Location: PACU  Anesthesia Type:General  Level of Consciousness: awake, alert  and oriented  Airway & Oxygen Therapy: Patient Spontanous Breathing and Patient connected to face mask oxygen  Post-op Assessment: Report given to PACU RN and Post -op Vital signs reviewed and stable  Post vital signs: Reviewed and stable  Complications: No apparent anesthesia complications

## 2014-07-20 NOTE — H&P (Signed)
Tammy Hughes  Location: [facility] Patient #: [id number] DOB: Apr 12, 1933 Married / Language: English / Race: Black or African American Female  History of Present Illness Patient words: gastric ca.  The patient is a 81 year old female who presents with gastric cancer. Pt is a 81 yo F who is referred by Dr. Deatra Ina for a diagnosis of a new gastric cancer. This was incidentally discovered upon endoscopy to follow up for an esophageal stricture. She did have another benign stricture, but also was found to have a 3x4 cm friable polypoid mass was seen along the lesser curve of the stomach. Biopsies were positive for adenocarcinoma. She also had multiple other small polyps in the stomach. She denies early satiety, weight loss, nausea, vomiting, hematemesis, fatigue. She is very energetic and does all her own cooking, shopping, and housework. She has post inflammatory pulmonary fibrosis, but is not on oxygen or inhalers. She had been diabetic for 15 years, and she has some neuropathy, but no delayed gastric emptying.    Other Problems  Arthritis Back Pain Diabetes Mellitus Gastroesophageal Reflux Disease Hypercholesterolemia  Past Surgical History  Gallbladder Surgery - Open  Diagnostic Studies History Mammogram >3 years ago  Allergies  Aspirin *ANALGESICS - NonNarcotic*  Medication History  Crestor (10MG  Tablet, Oral) Active. Furosemide (20MG  Tablet, Oral) Active. NovoLOG Mix 70/30 FlexPen ((70-30) 100UNIT/ML Susp Pen-inj, Subcutaneous) Active. Omeprazole (20MG  Capsule DR, Oral) Active.  Social History  Tobacco use Never smoker.  Pregnancy / Birth History Gravida 5  Review of Systems General Not Present- Appetite Loss, Chills, Fatigue, Fever, Night Sweats, Weight Gain and Weight Loss. Skin Present- Dryness. Not Present- Change in Wart/Mole, Hives, Jaundice, New Lesions, Non-Healing Wounds, Rash and Ulcer. HEENT Present- Wears glasses/contact  lenses. Not Present- Earache, Hearing Loss, Hoarseness, Nose Bleed, Oral Ulcers, Ringing in the Ears, Seasonal Allergies, Sinus Pain, Sore Throat, Visual Disturbances and Yellow Eyes. Gastrointestinal Present- Excessive gas. Not Present- Abdominal Pain, Bloating, Bloody Stool, Change in Bowel Habits, Chronic diarrhea, Constipation, Difficulty Swallowing, Gets full quickly at meals, Hemorrhoids, Indigestion, Nausea, Rectal Pain and Vomiting. Female Genitourinary Present- Frequency. Not Present- Nocturia, Painful Urination, Pelvic Pain and Urgency.   Vitals  Wt Readings from Last 3 Encounters:  07/20/14 163 lb (73.936 kg)  07/11/14 163 lb (73.936 kg)  06/14/14 163 lb 8 oz (74.163 kg)   Temp Readings from Last 3 Encounters:  07/20/14 98.6 F (37 C) Oral  07/11/14 97.9 F (36.6 C)   06/14/14 97.6 F (36.4 C) Oral   BP Readings from Last 3 Encounters:  07/20/14 153/64  07/11/14 135/55  06/14/14 137/47   Pulse Readings from Last 3 Encounters:  07/20/14 55  07/11/14 67  06/14/14 65       Physical Exam General Mental Status-Alert. General Appearance-Consistent with stated age. Hydration-Well hydrated. Voice-Normal.  Head and Neck Head-normocephalic, atraumatic with no lesions or palpable masses. Trachea-midline. Thyroid Gland Characteristics - normal size and consistency.  Eye Eyeball - Bilateral-Extraocular movements intact. Sclera/Conjunctiva - Bilateral-No scleral icterus.  Chest and Lung Exam Chest and lung exam reveals -quiet, even and easy respiratory effort with no use of accessory muscles and on auscultation, normal breath sounds, no adventitious sounds and normal vocal resonance. Inspection Chest Wall - Normal. Back - normal.  Cardiovascular Cardiovascular examination reveals -normal heart sounds, regular rate and rhythm with no murmurs and normal pedal pulses bilaterally.  Abdomen Inspection Inspection of the abdomen reveals - No  Hernias. Palpation/Percussion Palpation and Percussion of the abdomen reveal - Soft,  Non Tender, No Rebound tenderness, No Rigidity (guarding) and No hepatosplenomegaly. Auscultation Auscultation of the abdomen reveals - Bowel sounds normal.  Neurologic Neurologic evaluation reveals -alert and oriented x 3 with no impairment of recent or remote memory. Mental Status-Normal.  Musculoskeletal Global Assessment -Note: no gross deformities.  Normal Exam - Left-Upper Extremity Strength Normal and Lower Extremity Strength Normal. Normal Exam - Right-Upper Extremity Strength Normal and Lower Extremity Strength Normal.  Lymphatic Head & Neck  General Head & Neck Lymphatics: Bilateral - Description - Normal. Axillary  General Axillary Region: Bilateral - Description - Normal. Tenderness - Non Tender. Femoral & Inguinal  Generalized Femoral & Inguinal Lymphatics: Bilateral - Description - No Generalized lymphadenopathy.    Assessment & Plan GASTRIC CANCER (151.9  C16.9) Impression: Despite her age, the patient is a candidate for surgery.  She is quite robust for her age. She does have a bad location for her tumor. I am not sure if she would need a distal gastrectomy, a proximal gastrectomy, or a total gastrectomy. We would need to assess that at time of surgery. I reviewed the risk of bleeding, infection, leak, damage to adjacent structures, possible heart or lung complications, possible need to abort surgery for carcinomatosis, possible recurrent cancer. I discussed the potential long recovery period.  She has an appt with Dr. Benay Spice tomorrow. They will discuss chemotherapy and overall outcomes and decide which direction they want to pursue. Current Plans  Instructions:  Surgery would be a diagnostic laparoscopy (where we look in with a camera to look for spread) and possible partial versus total gastrectomy (removal of stomach) with possible feeding tube.  Main risks  of surgery would be leak from connection, infection, bleeding, damage to adjacent structures, heart or lung problems, wound issues, hernia, death (1-2%), possible feeding tube, possible recurrence of cancer.  Let us know what you decide.

## 2014-07-20 NOTE — Interval H&P Note (Signed)
History and Physical Interval Note:  07/20/2014 8:14 AM  Tammy Hughes  has presented today for surgery, with the diagnosis of gastric adenocarcinoma  The various methods of treatment have been discussed with the patient and family. After consideration of risks, benefits and other options for treatment, the patient has consented to  Procedure(s): POSSIBLE PARTIAL/POSSIBLE TOTAL GASTRECTOMY (N/A) LAPAROSCOPY DIAGNOSTIC (N/A) GASTROJEJUNOSTOMY (N/A) as a surgical intervention .  The patient's history has been reviewed, patient examined, no change in status, stable for surgery.  I have reviewed the patient's chart and labs.  Questions were answered to the patient's satisfaction.     Colden Samaras

## 2014-07-20 NOTE — Anesthesia Procedure Notes (Signed)
Procedure Name: Intubation Performed by: Garner Nash Pre-anesthesia Checklist: Patient identified, Emergency Drugs available, Suction available, Patient being monitored and Timeout performed Patient Re-evaluated:Patient Re-evaluated prior to inductionOxygen Delivery Method: Circle system utilized Preoxygenation: Pre-oxygenation with 100% oxygen Intubation Type: IV induction Ventilation: Mask ventilation without difficulty and Oral airway inserted - appropriate to patient size Laryngoscope Size: Mac and 3 Grade View: Grade II Tube type: Oral Tube size: 7.0 mm Number of attempts: 1 Airway Equipment and Method: Stylet Placement Confirmation: ETT inserted through vocal cords under direct vision,  breath sounds checked- equal and bilateral,  positive ETCO2 and CO2 detector Secured at: 22 cm Tube secured with: Tape Dental Injury: Teeth and Oropharynx as per pre-operative assessment

## 2014-07-20 NOTE — Anesthesia Preprocedure Evaluation (Addendum)
Anesthesia Evaluation  Patient identified by MRN, date of birth, ID band Patient awake    Reviewed: Allergy & Precautions, NPO status , Patient's Chart, lab work & pertinent test results  Airway Mallampati: II  TM Distance: >3 FB Neck ROM: Full    Dental  (+) Edentulous Upper, Edentulous Lower, Dental Advisory Given   Pulmonary  CT of the chest 06/08/2014 revealed lower lobe fibrosis in the lungs- pt denies SOB/dyspnea breath sounds clear to auscultation        Cardiovascular hypertension, Pt. on medications Rhythm:Regular Rate:Normal     Neuro/Psych PSYCHIATRIC DISORDERS Anxiety Depression    GI/Hepatic GERD-  Medicated and Controlled,(+) Hepatitis -NASH Esophageal stricture  polypoid mass 3 x 4 cm in the gastric fundus    Endo/Other  diabetes, Well Controlled, Type 2, Insulin DependentMorbid obesity  Renal/GU      Musculoskeletal  (+) Arthritis -,   Abdominal   Peds  Hematology   Anesthesia Other Findings   Reproductive/Obstetrics                         Anesthesia Physical Anesthesia Plan  ASA: III  Anesthesia Plan: General   Post-op Pain Management:    Induction: Intravenous  Airway Management Planned: Oral ETT  Additional Equipment:   Intra-op Plan:   Post-operative Plan: Extubation in OR  Informed Consent: I have reviewed the patients History and Physical, chart, labs and discussed the procedure including the risks, benefits and alternatives for the proposed anesthesia with the patient or authorized representative who has indicated his/her understanding and acceptance.   Dental advisory given  Plan Discussed with: CRNA, Surgeon and Anesthesiologist  Anesthesia Plan Comments:        Anesthesia Quick Evaluation

## 2014-07-21 ENCOUNTER — Encounter (HOSPITAL_COMMUNITY): Payer: Self-pay | Admitting: General Surgery

## 2014-07-21 LAB — BASIC METABOLIC PANEL
ANION GAP: 4 — AB (ref 5–15)
BUN: 15 mg/dL (ref 6–23)
CALCIUM: 8.9 mg/dL (ref 8.4–10.5)
CHLORIDE: 105 meq/L (ref 96–112)
CO2: 28 mmol/L (ref 19–32)
CREATININE: 0.91 mg/dL (ref 0.50–1.10)
GFR calc non Af Amer: 58 mL/min — ABNORMAL LOW (ref >90)
GFR, EST AFRICAN AMERICAN: 68 mL/min — AB (ref >90)
Glucose, Bld: 312 mg/dL — ABNORMAL HIGH (ref 70–99)
Potassium: 4.5 mmol/L (ref 3.5–5.1)
Sodium: 137 mmol/L (ref 135–145)

## 2014-07-21 LAB — CBC
HEMATOCRIT: 35.2 % — AB (ref 36.0–46.0)
Hemoglobin: 11.4 g/dL — ABNORMAL LOW (ref 12.0–15.0)
MCH: 28.6 pg (ref 26.0–34.0)
MCHC: 32.4 g/dL (ref 30.0–36.0)
MCV: 88.2 fL (ref 78.0–100.0)
Platelets: 227 10*3/uL (ref 150–400)
RBC: 3.99 MIL/uL (ref 3.87–5.11)
RDW: 13.6 % (ref 11.5–15.5)
WBC: 10 10*3/uL (ref 4.0–10.5)

## 2014-07-21 LAB — PHOSPHORUS: Phosphorus: 3 mg/dL (ref 2.3–4.6)

## 2014-07-21 LAB — GLUCOSE, CAPILLARY
GLUCOSE-CAPILLARY: 232 mg/dL — AB (ref 70–99)
Glucose-Capillary: 308 mg/dL — ABNORMAL HIGH (ref 70–99)
Glucose-Capillary: 235 mg/dL — ABNORMAL HIGH (ref 70–99)
Glucose-Capillary: 290 mg/dL — ABNORMAL HIGH (ref 70–99)

## 2014-07-21 LAB — PROTIME-INR
INR: 1.22 (ref 0.00–1.49)
Prothrombin Time: 15.6 seconds — ABNORMAL HIGH (ref 11.6–15.2)

## 2014-07-21 LAB — MAGNESIUM: Magnesium: 2 mg/dL (ref 1.5–2.5)

## 2014-07-21 LAB — MRSA PCR SCREENING: MRSA BY PCR: NEGATIVE

## 2014-07-21 MED ORDER — HYDRALAZINE HCL 20 MG/ML IJ SOLN
20.0000 mg | INTRAMUSCULAR | Status: DC | PRN
  Administered 2014-07-21 – 2014-07-24 (×2): 20 mg via INTRAVENOUS
  Filled 2014-07-21 (×2): qty 1

## 2014-07-21 MED ORDER — INSULIN GLARGINE 100 UNIT/ML ~~LOC~~ SOLN
12.0000 [IU] | Freq: Every day | SUBCUTANEOUS | Status: DC
  Administered 2014-07-21: 12 [IU] via SUBCUTANEOUS
  Filled 2014-07-21: qty 0.12

## 2014-07-21 NOTE — Progress Notes (Signed)
Utilization review completed.  

## 2014-07-21 NOTE — Progress Notes (Signed)
1 Day Post-Op  Subjective: Pt complains of pain.  No n/v.  Good UOP.  Objective: Vital signs in last 24 hours: Temp:  [97.8 F (36.6 C)-98.9 F (37.2 C)] 98.2 F (36.8 C) (01/07 0405) Pulse Rate:  [52-72] 68 (01/07 0409) Resp:  [12-27] 24 (01/07 0409) BP: (138-160)/(51-60) 160/56 mmHg (01/07 0409) SpO2:  [91 %-100 %] 98 % (01/07 0409) Arterial Line BP: (87-180)/(49-83) 172/67 mmHg (01/06 2337)    Intake/Output from previous day: 01/06 0701 - 01/07 0700 In: 3610.3 [I.V.:3528.3; IV Piggyback:50] Out: 2875 [Urine:2310; Emesis/NG output:100; Drains:65; Blood:400] Intake/Output this shift:    General appearance: cooperative, mild distress and sleepy Resp: breathing comfortably Cardio: regular rate and rhythm GI: soft, appropriately tender, non distended.  drain non bilious, now serosanguinous.  OnQ in place  Lab Results:   Recent Labs  07/20/14 2124 07/21/14 0535  WBC 12.9* 10.0  HGB 11.8* 11.4*  HCT 35.7* 35.2*  PLT 234 227   BMET  Recent Labs  07/20/14 2124 07/21/14 0535  NA  --  137  K  --  4.5  CL  --  105  CO2  --  28  GLUCOSE  --  312*  BUN  --  15  CREATININE 0.92 0.91  CALCIUM  --  8.9   PT/INR  Recent Labs  07/21/14 0535  LABPROT 15.6*  INR 1.22   ABG No results for input(s): PHART, HCO3 in the last 72 hours.  Invalid input(s): PCO2, PO2  Studies/Results: No results found.  Anti-infectives: Anti-infectives    Start     Dose/Rate Route Frequency Ordered Stop   07/20/14 1930  ceFAZolin (ANCEF) IVPB 1 g/50 mL premix     1 g100 mL/hr over 30 Minutes Intravenous Every 6 hours 07/20/14 1924 07/21/14 1329   07/20/14 1926  ceFAZolin (ANCEF) 1-5 GM-% IVPB    Comments:  Harris, Celine   : cabinet override      07/20/14 1926 07/21/14 0729   07/20/14 0600  ceFAZolin (ANCEF) IVPB 2 g/50 mL premix     2 g100 mL/hr over 30 Minutes Intravenous On call to O.R. 07/19/14 1405 07/20/14 0858      Assessment/Plan: s/p Procedure(s): PROXIMAL  GASTRECTOMY (N/A) LAPAROSCOPY DIAGNOSTIC (N/A) GASTROJEJUNOSTOMY (N/A) PAS Continue foley due to strict I&O and urinary output monitoring OOB  Pulmonary toilet DM - increase lantus.  HTN - Add hydralazine, start IV lasix tomorrow NPO/NGT Plan UGI to look for leaks probably Saturday prior to d/c NGT.  Nutrition consult for tube feeds to start tomorrow.    LOS: 1 day    [facility] 07/21/2014

## 2014-07-21 NOTE — Progress Notes (Signed)
Inpatient Diabetes Program Recommendations  AACE/ADA: New Consensus Statement on Inpatient Glycemic Control (2013)  Target Ranges:  Prepandial:   less than 140 mg/dL      Peak postprandial:   less than 180 mg/dL (1-2 hours)      Critically ill patients:  140 - 180 mg/dL   Reason for Assessment:  Results for ALVIA, JABLONSKI (MRN [id number]) as of 07/21/2014 12:30  Ref. Range 07/20/2014 06:53 07/20/2014 12:18 07/20/2014 13:56 07/20/2014 17:55  Glucose-Capillary Latest Range: 70-99 mg/dL 173 (H) 250 (H) 233 (H) 314 (H)   Diabetes history: Type 2 diabetes Outpatient Diabetes medications: Novolog 70/30 70 units q AM and 60 units with supper Current orders for Inpatient glycemic control:  Lantus 12 units q HS (to start tonight), Novolog resistant q 4 hours  Note that patient's insulin needs prior to surgery were high.  If CBG's remain elevated, consider increasing Lantus to 12 units bid (approximately 0.3 units/kg) .  Will need to continue to titrate Lantus based on fasting CBG's.  Once tube feeds are added, consider adding Novolog tube feed coverage to cover carbohydrate content of tube feeds. Will follow.    Thanks, Adah Perl, RN, BC-ADM Inpatient Diabetes Coordinator Pager [phone]

## 2014-07-21 NOTE — Progress Notes (Addendum)
INITIAL NUTRITION ASSESSMENT  DOCUMENTATION CODES Per approved criteria  -Obesity Unspecified   INTERVENTION:  Initiate TF via J-tube on 1/8 with Osmolite 1.2 at 20 ml/h. On Saturday, (1/9) increase by 10 ml every 4 hours to goal rate of 50 ml/h to provide 1440 kcals, 67 gm protein, 984 ml free water daily.  NUTRITION DIAGNOSIS: Inadequate oral intake related to inability to eat as evidenced by NPO status.   Goal: Intake to meet >90% of estimated nutrition needs.  Monitor:  TF tolerance/adequacy, weight trend, labs.  Reason for Assessment: MD Consult for TF initiation and management.  81 y.o. female  Admitting Dx: Gastric CA  ASSESSMENT: Patient admitted on 1/6 S/P laparoscopy with proximal gastrectomy and feeding jejunostomy.  Patient's family in room with her during RD visit. They report that patient had a good appetite, stable weight, and no nutrition issues PTA.   Received MD Consult for TF initiation and management. TF to begin tomorrow at low rate, then start advancement on Saturday via J-tube. NGT is in place. Plans for UGI to look for leaks Saturday before d/c NGT.  Will start Osmolite 1.2 at 20 ml/h on Friday at 10 AM, if tolerating well on Saturday, can increase by 10 ml every 4 hours to goal rate of 50 ml/h to provide 1440 kcals, 67 gm protein, 984 ml free water daily.    Height: Ht Readings from Last 1 Encounters:  07/21/14 5\' 1"  (1.549 m)    Weight: Wt Readings from Last 1 Encounters:  07/21/14 163 lb 2.3 oz (74 kg)    Ideal Body Weight: 47.7 kg  % Ideal Body Weight: 155%  Wt Readings from Last 10 Encounters:  07/21/14 163 lb 2.3 oz (74 kg)  07/11/14 163 lb (73.936 kg)  06/14/14 163 lb 8 oz (74.163 kg)  05/31/14 166 lb (75.297 kg)  04/14/14 166 lb (75.297 kg)  04/06/14 167 lb 6.4 oz (75.932 kg)  03/15/14 165 lb (74.844 kg)  11/12/13 167 lb (75.751 kg)  10/26/13 167 lb (75.751 kg)  10/13/13 168 lb (76.204 kg)    Usual Body Weight: 166 lb  %  Usual Body Weight: 98%  BMI:  Body mass index is 30.84 kg/(m^2).  Estimated Nutritional Needs: Kcal: 1300-1500 Protein: 65-80 gm Fluid: 1.5 L  Skin: no issues  Diet Order: Diet NPO time specified  EDUCATION NEEDS: -No education needs identified at this time   Intake/Output Summary (Last 24 hours) at 07/21/14 1012 Last data filed at 07/21/14 0800  Gross per 24 hour  Intake 3610.33 ml  Output   2625 ml  Net 985.33 ml    Last BM: 1/6   Labs:   Recent Labs Lab 07/20/14 2124 07/21/14 0535  NA  --  137  K  --  4.5  CL  --  105  CO2  --  28  BUN  --  15  CREATININE 0.92 0.91  CALCIUM  --  8.9  MG  --  2.0  PHOS  --  3.0  GLUCOSE  --  312*    CBG (last 3)   Recent Labs  07/20/14 1218 07/20/14 1356 07/20/14 1755  GLUCAP 250* 233* 314*    Scheduled Meds: . heparin  5,000 Units Subcutaneous 3 times per day  . insulin aspart  0-20 Units Subcutaneous 6 times per day  . insulin glargine  12 Units Subcutaneous QHS  . morphine   Intravenous 6 times per day    Continuous Infusions: . bupivacaine ON-Q pain pump    .  dextrose 5 % and 0.45 % NaCl with KCl 20 mEq/L 100 mL/hr at 07/20/14 2211    Past Medical History  Diagnosis Date  . HYPERCHOLESTEROLEMIA 02/01/2008  . ANXIETY 03/08/2008  . PERIPHERAL NEUROPATHY 02/23/2007  . HYPERTENSION 03/08/2008  . GERD 02/23/2007  . DIVERTICULOSIS, COLON 03/08/2008  . OSTEOARTHRITIS 02/23/2007  . OSTEOPOROSIS 02/23/2007  . Urolithiasis   . Allergy     SEASONAL  . Cataract     REMOVED BILATERAL  . DIABETES MELLITUS, TYPE I 02/23/2007  . Depression     Past Surgical History  Procedure Laterality Date  . Cholecystectomy  1995  . Esophagogastroduodenoscopy  06/08/2002     Molli Barrows, RD, LDN, [place] Pager [phone] After Hours Pager [phone]

## 2014-07-22 ENCOUNTER — Inpatient Hospital Stay (HOSPITAL_COMMUNITY): Payer: Medicare Other

## 2014-07-22 LAB — CBC
HEMATOCRIT: 33.2 % — AB (ref 36.0–46.0)
Hemoglobin: 10.7 g/dL — ABNORMAL LOW (ref 12.0–15.0)
MCH: 28.5 pg (ref 26.0–34.0)
MCHC: 32.2 g/dL (ref 30.0–36.0)
MCV: 88.3 fL (ref 78.0–100.0)
PLATELETS: 195 10*3/uL (ref 150–400)
RBC: 3.76 MIL/uL — AB (ref 3.87–5.11)
RDW: 13.8 % (ref 11.5–15.5)
WBC: 13 10*3/uL — ABNORMAL HIGH (ref 4.0–10.5)

## 2014-07-22 LAB — GLUCOSE, CAPILLARY
GLUCOSE-CAPILLARY: 226 mg/dL — AB (ref 70–99)
GLUCOSE-CAPILLARY: 250 mg/dL — AB (ref 70–99)
Glucose-Capillary: 239 mg/dL — ABNORMAL HIGH (ref 70–99)
Glucose-Capillary: 242 mg/dL — ABNORMAL HIGH (ref 70–99)
Glucose-Capillary: 253 mg/dL — ABNORMAL HIGH (ref 70–99)
Glucose-Capillary: 262 mg/dL — ABNORMAL HIGH (ref 70–99)

## 2014-07-22 LAB — BASIC METABOLIC PANEL
Anion gap: 6 (ref 5–15)
BUN: 14 mg/dL (ref 6–23)
CALCIUM: 8.9 mg/dL (ref 8.4–10.5)
CHLORIDE: 108 meq/L (ref 96–112)
CO2: 24 mmol/L (ref 19–32)
Creatinine, Ser: 0.92 mg/dL (ref 0.50–1.10)
GFR calc Af Amer: 67 mL/min — ABNORMAL LOW (ref >90)
GFR calc non Af Amer: 58 mL/min — ABNORMAL LOW (ref >90)
GLUCOSE: 284 mg/dL — AB (ref 70–99)
Potassium: 4.2 mmol/L (ref 3.5–5.1)
Sodium: 138 mmol/L (ref 135–145)

## 2014-07-22 MED ORDER — IOHEXOL 300 MG/ML  SOLN
150.0000 mL | Freq: Once | INTRAMUSCULAR | Status: AC | PRN
  Administered 2014-07-22: 25 mL via ORAL

## 2014-07-22 MED ORDER — OSMOLITE 1.2 CAL PO LIQD
1000.0000 mL | ORAL | Status: DC
  Administered 2014-07-22: 1000 mL
  Filled 2014-07-22 (×4): qty 1000

## 2014-07-22 MED ORDER — INSULIN GLARGINE 100 UNIT/ML ~~LOC~~ SOLN
20.0000 [IU] | Freq: Every day | SUBCUTANEOUS | Status: DC
  Administered 2014-07-23 – 2014-07-24 (×3): 20 [IU] via SUBCUTANEOUS
  Filled 2014-07-22 (×4): qty 0.2

## 2014-07-22 NOTE — Progress Notes (Signed)
Received clarification from Dr. Barry Dienes to remove drainage bag and begin tube feeding via J tube, continue NGT to LIS, continue foley catheter, and to remove arterial line.

## 2014-07-22 NOTE — Progress Notes (Signed)
Pt OOB with Moderate assist stood at bedside took a few steps, feeling week having increased pain. Pt back to bed per request. Cough and deep breathing encouraged along with splinting incision for pain control. VSS PCA in hand encouraged to utilize if needed.

## 2014-07-22 NOTE — Progress Notes (Signed)
Inpatient Diabetes Program Recommendations  AACE/ADA: New Consensus Statement on Inpatient Glycemic Control (2013)  Target Ranges:  Prepandial:   less than 140 mg/dL      Peak postprandial:   less than 180 mg/dL (1-2 hours)      Critically ill patients:  140 - 180 mg/dL   Reason for Assessment:  Results for LOVELL, ROE (MRN [id number]) as of 07/22/2014 10:12  Ref. Range 07/21/2014 15:44 07/21/2014 19:52 07/21/2014 23:35 07/22/2014 04:43 07/22/2014 08:15  Glucose-Capillary Latest Range: 70-99 mg/dL 235 (H) 290 (H) 239 (H) 262 (H) 253 (H)    CBG's continue to be increased.  Agree with increase in Lantus.  Continue to titrate up.  Also once tube feeds started, patient will likely need CHO coverage for tube feeds.  Consider 3 units Novolog q 4 hours once tube feeds are at goal.  Thanks, Adah Perl, RN, BC-ADM Inpatient Diabetes Coordinator Pager [phone]

## 2014-07-22 NOTE — Progress Notes (Signed)
Report called to RN on 6N. Pt currently off unit to radiology, will transfer after pt returns.

## 2014-07-22 NOTE — Progress Notes (Signed)
NUTRITION FOLLOW UP  Intervention:    Initiate TF via J-tube on 1/8 with Osmolite 1.2 at 10 ml/h. On Saturday, (1/9) increase by 10 ml every 4 hours to goal rate of 50 ml/h to provide 1440 kcals, 67 gm protein, 984 ml free water daily.  Nutrition Dx:   Inadequate oral intake related to inability to eat as evidenced by NPO status, ongoing.  Goal:   Intake to meet >90% of estimated nutrition needs, progressing.  Monitor:   TF tolerance/adequacy, weight trend, labs.  Assessment:   Patient admitted on 1/6 S/P laparoscopy with proximal gastrectomy and feeding jejunostomy.  TF to be started this morning with Osmolite 1.2 at 10 ml/h. On Saturday, recommend increase by 10 ml every 4 hours to goal rate of 50 ml/h to provide 1440 kcals, 67 gm protein, 984 ml free water daily.  Height: Ht Readings from Last 1 Encounters:  07/21/14 5\' 1"  (1.549 m)    Weight Status:   Wt Readings from Last 1 Encounters:  07/21/14 163 lb 2.3 oz (74 kg)    Re-estimated needs:  Kcal: 1300-1500 Protein: 65-80 gm Fluid: 1.5 L  Skin: no issues  Diet Order: Diet NPO time specified   Intake/Output Summary (Last 24 hours) at 07/22/14 0930 Last data filed at 07/22/14 0825  Gross per 24 hour  Intake   2600 ml  Output   3450 ml  Net   -850 ml    Last BM: 1/6   Labs:   Recent Labs Lab 07/20/14 2124 07/21/14 0535 07/22/14 0240  NA  --  137 138  K  --  4.5 4.2  CL  --  105 108  CO2  --  28 24  BUN  --  15 14  CREATININE 0.92 0.91 0.92  CALCIUM  --  8.9 8.9  MG  --  2.0  --   PHOS  --  3.0  --   GLUCOSE  --  312* 284*    CBG (last 3)   Recent Labs  07/21/14 2335 07/22/14 0443 07/22/14 0815  GLUCAP 239* 262* 253*    Scheduled Meds: . feeding supplement (OSMOLITE 1.2 CAL)  1,000 mL Per Tube Q24H  . heparin  5,000 Units Subcutaneous 3 times per day  . insulin aspart  0-20 Units Subcutaneous 6 times per day  . insulin glargine  20 Units Subcutaneous QHS  . morphine   Intravenous 6  times per day    Continuous Infusions: . bupivacaine ON-Q pain pump    . dextrose 5 % and 0.45 % NaCl with KCl 20 mEq/L 75 mL/hr at 07/22/14 0825     Molli Barrows, RD, LDN, [place] Pager [phone] After Hours Pager [phone]

## 2014-07-22 NOTE — Progress Notes (Signed)
Rehab Admissions Coordinator Note:  Patient was screened by Lisabeth Mian L for appropriateness for an Inpatient Acute Rehab Consult.  At this time, we are recommending Inpatient Rehab consult.  Abisola Carrero L 07/22/2014, 1:40 PM  I can be reached at [phone].

## 2014-07-22 NOTE — Evaluation (Signed)
Physical Therapy Evaluation Patient Details Name: Tammy Hughes MRN: [id number] DOB: 1932/12/27 Today's Date: 07/22/2014   History of Present Illness  81 year old female who presents with gastric cancer s/p laproscopy with partial gastrectomy and j tube placement  Clinical Impression  Pt pleasant but appears tired and fatigued. [name] is normally very independent caring for herself and volunteering. Spouse unable to significantly physically assist at home and no 24hr care set up but children may be able to provide. Pt currently with impaired strength, balance and function who will benefit from acute therapy to maximize mobility, function and gait to decrease burden of care and return pt to PLOF.     Follow Up Recommendations Supervision/Assistance - 24 hour;CIR    Equipment Recommendations  Rolling walker with 5" wheels;3in1 (PT)    Recommendations for Other Services OT consult;Rehab consult     Precautions / Restrictions Precautions Precaution Comments: jp drain, NG tube, Panda      Mobility  Bed Mobility Overal bed mobility: Needs Assistance Bed Mobility: Rolling;Sidelying to Sit Rolling: Mod assist Sidelying to sit: Max assist       General bed mobility comments: cues for sequence with pt trying to pull on therapist rather than rail and having difficulty achieving full sidelying with assist. max assist for side to sit to elevate trunk and bring legs off of bed  Transfers Overall transfer level: Needs assistance   Transfers: Sit to/from Stand;Stand Pivot Transfers Sit to Stand: Mod assist Stand pivot transfers: Min assist       General transfer comment: cues for sequence with assist for elevation from surface with hand held assist to stand and pivot toward chair  Ambulation/Gait                Stairs            Wheelchair Mobility    Modified Rankin (Stroke Patients Only)       Balance Overall balance assessment: Needs assistance   Sitting  balance-Leahy Scale: Poor Sitting balance - Comments: EOB 4 min with min assist for balance secondary to posterior lean Postural control: Posterior lean   Standing balance-Leahy Scale: Poor                               Pertinent Vitals/Pain Pain Assessment: 0-10 Pain Score: 5  Pain Location: abdomen Pain Descriptors / Indicators: Aching Pain Intervention(s): Repositioned;PCA encouraged  sats 89-92% on RA, on 1L 93%    Home Living Family/patient expects to be discharged to:: Private residence Living Arrangements: Spouse/significant other Available Help at Discharge: Family;Available 24 hours/day Type of Home: House Home Access: Level entry     Home Layout: One level Home Equipment: None      Prior Function Level of Independence: Independent               Hand Dominance        Extremity/Trunk Assessment   Upper Extremity Assessment: Generalized weakness           Lower Extremity Assessment: Generalized weakness      Cervical / Trunk Assessment: Normal  Communication   Communication: No difficulties  Cognition Arousal/Alertness: Awake/alert Behavior During Therapy: Flat affect Overall Cognitive Status: Impaired/Different from baseline Area of Impairment: Attention   Current Attention Level: Sustained           General Comments: pt with flat affect, very limited interraction    General Comments  Exercises        Assessment/Plan    PT Assessment Patient needs continued PT services  PT Diagnosis Generalized weakness;Acute pain;Difficulty walking   PT Problem List Decreased strength;Decreased activity tolerance;Pain;Decreased knowledge of use of DME;Decreased balance;Decreased mobility  PT Treatment Interventions Gait training;DME instruction;Functional mobility training;Therapeutic activities;Therapeutic exercise;Balance training;Patient/family education   PT Goals (Current goals can be found in the Care Plan section)  Acute Rehab PT Goals Patient Stated Goal: return home PT Goal Formulation: With patient/family Time For Goal Achievement: 08/05/14 Potential to Achieve Goals: Good    Frequency Min 3X/week   Barriers to discharge Decreased caregiver support      Co-evaluation               End of Session   Activity Tolerance: Patient tolerated treatment well Patient left: in chair;with call bell/phone within reach Nurse Communication: Mobility status         Time: 6579-0383 PT Time Calculation (min) (ACUTE ONLY): 25 min   Charges:   PT Evaluation $Initial PT Evaluation Tier I: 1 Procedure PT Treatments $Therapeutic Activity: 8-22 mins   PT G CodesMelford Aase 07/22/2014, 1:31 PM Elwyn Reach, [place]

## 2014-07-22 NOTE — Progress Notes (Signed)
Patient ID: Tammy Hughes, female   DOB: Jun 12, 1933, 81 y.o.   MRN: [id number] 2 Days Post-Op  Subjective: Pt complains of pain.  Sleepy however.  Was OOB yesterday x 1.    Objective: Vital signs in last 24 hours: Temp:  [97.8 F (36.6 C)-100.2 F (37.9 C)] 97.8 F (36.6 C) (01/08 0441) Pulse Rate:  [76-99] 85 (01/08 0716) Resp:  [16-28] 18 (01/08 0758) BP: (142-171)/(47-86) 142/57 mmHg (01/08 0716) SpO2:  [96 %-98 %] 97 % (01/08 0758) Arterial Line BP: (61-173)/(53-66) 149/61 mmHg (01/08 0716) Last BM Date: 07/20/14  Intake/Output from previous day: 01/07 0701 - 01/08 0700 In: 2400 [I.V.:2400] Out: 3540 [Urine:3425; Emesis/NG output:40; Drains:75] Intake/Output this shift:    General appearance: cooperative, mild distress and sleepy Resp: breathing comfortably Cardio: regular rate and rhythm GI: soft, appropriately tender, non distended.  drain non bilious, now serosanguinous.  OnQ in place  Lab Results:   Recent Labs  07/21/14 0535 07/22/14 0240  WBC 10.0 13.0*  HGB 11.4* 10.7*  HCT 35.2* 33.2*  PLT 227 195   BMET  Recent Labs  07/21/14 0535 07/22/14 0240  NA 137 138  K 4.5 4.2  CL 105 108  CO2 28 24  GLUCOSE 312* 284*  BUN 15 14  CREATININE 0.91 0.92  CALCIUM 8.9 8.9   PT/INR  Recent Labs  07/21/14 0535  LABPROT 15.6*  INR 1.22   ABG No results for input(s): PHART, HCO3 in the last 72 hours.  Invalid input(s): PCO2, PO2  Studies/Results: No results found.  Anti-infectives: Anti-infectives    Start     Dose/Rate Route Frequency Ordered Stop   07/20/14 1930  ceFAZolin (ANCEF) IVPB 1 g/50 mL premix     1 g100 mL/hr over 30 Minutes Intravenous Every 6 hours 07/20/14 1924 07/21/14 0740   07/20/14 1926  ceFAZolin (ANCEF) 1-5 GM-% IVPB    Comments:  Harris, Celine   : cabinet override      07/20/14 1926 07/21/14 0729   07/20/14 0600  ceFAZolin (ANCEF) IVPB 2 g/50 mL premix     2 g100 mL/hr over 30 Minutes Intravenous On call to O.R. 07/19/14  1405 07/20/14 0858      Assessment/Plan: s/p Procedure(s): PROXIMAL GASTRECTOMY (N/A) LAPAROSCOPY DIAGNOSTIC (N/A) GASTROJEJUNOSTOMY (N/A) PAS Continue foley due to strict I&O and urinary output monitoring OOB  Pulmonary toilet DM - increase lantus.  HTN - Start IV lasix today NPO/NGT Plan UGI to look for leaks probably Saturday prior to d/c NGT.  Nutrition consult for tube feeds to start today    LOS: 2 days    [facility] 07/22/2014

## 2014-07-22 NOTE — Progress Notes (Signed)
Up in chair per PT for 1.5 hours, returned to bed with two standby assist, pt able to stand, bear full weight and pivot back to bed. N/G with thick bile green output, foley patent, minimal effort with IS, will continue to offer.

## 2014-07-23 LAB — GLUCOSE, CAPILLARY
GLUCOSE-CAPILLARY: 187 mg/dL — AB (ref 70–99)
GLUCOSE-CAPILLARY: 209 mg/dL — AB (ref 70–99)
GLUCOSE-CAPILLARY: 256 mg/dL — AB (ref 70–99)
Glucose-Capillary: 205 mg/dL — ABNORMAL HIGH (ref 70–99)
Glucose-Capillary: 205 mg/dL — ABNORMAL HIGH (ref 70–99)
Glucose-Capillary: 215 mg/dL — ABNORMAL HIGH (ref 70–99)
Glucose-Capillary: 233 mg/dL — ABNORMAL HIGH (ref 70–99)

## 2014-07-23 LAB — CBC
HCT: 31.9 % — ABNORMAL LOW (ref 36.0–46.0)
Hemoglobin: 10.1 g/dL — ABNORMAL LOW (ref 12.0–15.0)
MCH: 28.3 pg (ref 26.0–34.0)
MCHC: 31.7 g/dL (ref 30.0–36.0)
MCV: 89.4 fL (ref 78.0–100.0)
Platelets: 200 10*3/uL (ref 150–400)
RBC: 3.57 MIL/uL — AB (ref 3.87–5.11)
RDW: 13.9 % (ref 11.5–15.5)
WBC: 13.3 10*3/uL — ABNORMAL HIGH (ref 4.0–10.5)

## 2014-07-23 LAB — BASIC METABOLIC PANEL
Anion gap: 3 — ABNORMAL LOW (ref 5–15)
BUN: 13 mg/dL (ref 6–23)
CALCIUM: 9.3 mg/dL (ref 8.4–10.5)
CO2: 28 mmol/L (ref 19–32)
Chloride: 110 mEq/L (ref 96–112)
Creatinine, Ser: 0.79 mg/dL (ref 0.50–1.10)
GFR calc Af Amer: 89 mL/min — ABNORMAL LOW (ref >90)
GFR calc non Af Amer: 77 mL/min — ABNORMAL LOW (ref >90)
GLUCOSE: 228 mg/dL — AB (ref 70–99)
Potassium: 4.3 mmol/L (ref 3.5–5.1)
SODIUM: 141 mmol/L (ref 135–145)

## 2014-07-23 MED ORDER — CETYLPYRIDINIUM CHLORIDE 0.05 % MT LIQD
7.0000 mL | Freq: Two times a day (BID) | OROMUCOSAL | Status: DC
  Administered 2014-07-23 – 2014-07-26 (×8): 7 mL via OROMUCOSAL

## 2014-07-23 MED ORDER — GLUCERNA 1.2 CAL PO LIQD
1000.0000 mL | ORAL | Status: DC
  Administered 2014-07-23: 474 mL
  Filled 2014-07-23 (×2): qty 1000

## 2014-07-23 NOTE — Progress Notes (Signed)
3 Days Post-Op  Subjective: No flatus or BM.    Objective: Vital signs in last 24 hours: Temp:  [98.4 F (36.9 C)-99.6 F (37.6 C)] 99.6 F (37.6 C) (01/09 0906) Pulse Rate:  [71-82] 71 (01/09 0906) Resp:  [13-19] 16 (01/09 0906) BP: (137-165)/(53-69) 152/63 mmHg (01/09 0906) SpO2:  [91 %-99 %] 97 % (01/09 0906) FiO2 (%):  [98 %] 98 % (01/09 0800) Last BM Date: 07/20/14  Intake/Output from previous day: 01/08 0701 - 01/09 0700 In: 1761 [I.V.:1651; NG/GT:110] Out: 2890 [Urine:2575; Emesis/NG output:250; Drains:65] Intake/Output this shift:    PE: General- In NAD Abdomen-soft, hypoactive bowel sounds, incisions clean and intact  Lab Results:   Recent Labs  07/22/14 0240 07/23/14 0407  WBC 13.0* 13.3*  HGB 10.7* 10.1*  HCT 33.2* 31.9*  PLT 195 200   BMET  Recent Labs  07/22/14 0240 07/23/14 0407  NA 138 141  K 4.2 4.3  CL 108 110  CO2 24 28  GLUCOSE 284* 228*  BUN 14 13  CREATININE 0.92 0.79  CALCIUM 8.9 9.3   PT/INR  Recent Labs  07/21/14 0535  LABPROT 15.6*  INR 1.22   Comprehensive Metabolic Panel:    Component Value Date/Time   NA 141 07/23/2014 0407   NA 138 07/22/2014 0240   K 4.3 07/23/2014 0407   K 4.2 07/22/2014 0240   CL 110 07/23/2014 0407   CL 108 07/22/2014 0240   CO2 28 07/23/2014 0407   CO2 24 07/22/2014 0240   BUN 13 07/23/2014 0407   BUN 14 07/22/2014 0240   CREATININE 0.79 07/23/2014 0407   CREATININE 0.92 07/22/2014 0240   GLUCOSE 228* 07/23/2014 0407   GLUCOSE 284* 07/22/2014 0240   CALCIUM 9.3 07/23/2014 0407   CALCIUM 8.9 07/22/2014 0240   CALCIUM 9.9 07/23/2011 0858   AST 48* 05/31/2014 1603   AST 27 08/06/2013 0946   ALT 45* 05/31/2014 1603   ALT 27 08/06/2013 0946   ALKPHOS 66 05/31/2014 1603   ALKPHOS 74 08/06/2013 0946   BILITOT 0.6 05/31/2014 1603   BILITOT 0.4 08/06/2013 0946   PROT 7.5 05/31/2014 1603   PROT 7.5 08/06/2013 0946   ALBUMIN 3.7 05/31/2014 1603   ALBUMIN 3.7 08/06/2013 0946      Studies/Results: Dg Ugi W/water Sol Cm  07/22/2014   CLINICAL DATA:  81 year old female status post partial gastrectomy and primary reanastomosis of the esophagus and stomach remnant, for treatment of gastric cancer along the lesser curve. Postoperative contrast swallow. Initial encounter.  EXAM: WATER SOLUBLE UPPER GI SERIES  TECHNIQUE: Single-column upper GI series was performed using water soluble contrast.  CONTRAST:  60mL OMNIPAQUE IOHEXOL 300 MG/ML  SOLN  COMPARISON:  CT Abdomen and Pelvis 06/08/2014.  FLUOROSCOPY TIME:  1 min 30 seconds  FINDINGS: The patient was first given a small volume of PO water which she tolerated without difficulty.  Next she was given small sips of Omnipaque contrast.  An NG tube is in place. A postoperative drain is in place along the residual lesser curve. Cholecystectomy clips. Partially visible Hughes-tube.  No obstruction to the forward flow of contrast throughout the esophagus and into the stomach. Mildly patulous esophagus. Satisfactory appearance of the gastroesophageal junction. Prompt contrast transit to the distal stomach. No abnormal contrast accumulation or leak identified.  However, intermittent gastroesophageal reflux occurred throughout the study. Reflux was to the level of the thoracic inlet.  At the conclusion there is contrast transit through the duodenum to the ligament of Treitz,  which appear normal.  IMPRESSION: Satisfactory postoperative appearance of partial gastrectomy and reanastomosis. A patulous esophagus with frequent gastroesophageal reflux is noted.   Electronically Signed   By: Lars Pinks M.D.   On: 07/22/2014 10:43    Anti-infectives: Anti-infectives    Start     Dose/Rate Route Frequency Ordered Stop   07/20/14 1930  ceFAZolin (ANCEF) IVPB 1 g/50 mL premix     1 g100 mL/hr over 30 Minutes Intravenous Every 6 hours 07/20/14 1924 07/21/14 0740   07/20/14 1926  ceFAZolin (ANCEF) 1-5 GM-% IVPB    Comments:  Harris, Celine   : cabinet override       07/20/14 1926 07/21/14 0729   07/20/14 0600  ceFAZolin (ANCEF) IVPB 2 g/50 mL premix     2 g100 mL/hr over 30 Minutes Intravenous On call to O.R. 07/19/14 1405 07/20/14 0858      Assessment Active Problems:   Gastric cancer s/p partial gastrectomy and feeding jejunostomy 07/20/14-UGI yesterday negative for leak-wound looks good  Diabetes Mellitus-CBGs 215-250  HTN    LOS: 3 days   Plan: Change tube feeds to Glucerna-should help with blood sugar control.  Remove ng tube.   Tammy Hughes 07/23/2014

## 2014-07-24 DIAGNOSIS — I472 Ventricular tachycardia: Secondary | ICD-10-CM

## 2014-07-24 DIAGNOSIS — E780 Pure hypercholesterolemia: Secondary | ICD-10-CM

## 2014-07-24 DIAGNOSIS — I10 Essential (primary) hypertension: Secondary | ICD-10-CM

## 2014-07-24 DIAGNOSIS — I4729 Other ventricular tachycardia: Secondary | ICD-10-CM

## 2014-07-24 LAB — GLUCOSE, CAPILLARY
GLUCOSE-CAPILLARY: 170 mg/dL — AB (ref 70–99)
GLUCOSE-CAPILLARY: 197 mg/dL — AB (ref 70–99)
GLUCOSE-CAPILLARY: 172 mg/dL — AB (ref 70–99)
Glucose-Capillary: 186 mg/dL — ABNORMAL HIGH (ref 70–99)
Glucose-Capillary: 252 mg/dL — ABNORMAL HIGH (ref 70–99)

## 2014-07-24 LAB — TROPONIN I
TROPONIN I: 0.6 ng/mL — AB (ref <0.031)
Troponin I: 0.51 ng/mL (ref <0.031)
Troponin I: 0.57 ng/mL (ref <0.031)

## 2014-07-24 LAB — CBC
HCT: 30.9 % — ABNORMAL LOW (ref 36.0–46.0)
HEMOGLOBIN: 9.9 g/dL — AB (ref 12.0–15.0)
MCH: 28.2 pg (ref 26.0–34.0)
MCHC: 32 g/dL (ref 30.0–36.0)
MCV: 88 fL (ref 78.0–100.0)
PLATELETS: 239 10*3/uL (ref 150–400)
RBC: 3.51 MIL/uL — ABNORMAL LOW (ref 3.87–5.11)
RDW: 13.6 % (ref 11.5–15.5)
WBC: 12.2 10*3/uL — ABNORMAL HIGH (ref 4.0–10.5)

## 2014-07-24 LAB — BASIC METABOLIC PANEL
Anion gap: 5 (ref 5–15)
BUN: 12 mg/dL (ref 6–23)
CALCIUM: 9.3 mg/dL (ref 8.4–10.5)
CO2: 26 mmol/L (ref 19–32)
CREATININE: 0.76 mg/dL (ref 0.50–1.10)
Chloride: 110 mEq/L (ref 96–112)
GFR calc Af Amer: >90 mL/min (ref >90)
GFR calc non Af Amer: 78 mL/min — ABNORMAL LOW (ref >90)
Glucose, Bld: 200 mg/dL — ABNORMAL HIGH (ref 70–99)
POTASSIUM: 4.2 mmol/L (ref 3.5–5.1)
SODIUM: 141 mmol/L (ref 135–145)

## 2014-07-24 LAB — CK TOTAL AND CKMB (NOT AT ARMC)
CK, MB: 7 ng/mL — AB (ref 0.3–4.0)
Relative Index: 0.8 (ref 0.0–2.5)
Total CK: 920 U/L — ABNORMAL HIGH (ref 7–177)

## 2014-07-24 LAB — MAGNESIUM: Magnesium: 1.9 mg/dL (ref 1.5–2.5)

## 2014-07-24 LAB — HEPARIN LEVEL (UNFRACTIONATED): Heparin Unfractionated: 0.54 IU/mL (ref 0.30–0.70)

## 2014-07-24 MED ORDER — HEPARIN BOLUS VIA INFUSION
4000.0000 [IU] | Freq: Once | INTRAVENOUS | Status: AC
  Administered 2014-07-24: 4000 [IU] via INTRAVENOUS
  Filled 2014-07-24: qty 4000

## 2014-07-24 MED ORDER — GLUCERNA 1.2 CAL PO LIQD
1000.0000 mL | ORAL | Status: DC
  Administered 2014-07-24 – 2014-07-25 (×2): 1000 mL
  Filled 2014-07-24 (×3): qty 1000

## 2014-07-24 MED ORDER — HEPARIN (PORCINE) IN NACL 100-0.45 UNIT/ML-% IJ SOLN
900.0000 [IU]/h | INTRAMUSCULAR | Status: DC
  Administered 2014-07-24 – 2014-07-25 (×2): 900 [IU]/h via INTRAVENOUS
  Filled 2014-07-24 (×3): qty 250

## 2014-07-24 MED ORDER — PANTOPRAZOLE SODIUM 40 MG PO TBEC
40.0000 mg | DELAYED_RELEASE_TABLET | Freq: Every day | ORAL | Status: DC
  Administered 2014-07-24 – 2014-07-28 (×5): 40 mg via ORAL
  Filled 2014-07-24 (×5): qty 1

## 2014-07-24 MED ORDER — METOPROLOL TARTRATE 25 MG/10 ML ORAL SUSPENSION
25.0000 mg | Freq: Two times a day (BID) | ORAL | Status: DC
  Administered 2014-07-24 – 2014-07-25 (×4): 25 mg
  Filled 2014-07-24 (×7): qty 10

## 2014-07-24 NOTE — Progress Notes (Signed)
Patient had 19 beats V tach this am.  New event for this patient.  Patient sleeping at the time;no complaints.  VSS.  Text paged Dr. Georgette Dover.

## 2014-07-24 NOTE — Progress Notes (Signed)
Called about elevated troponin to 0.51. Will add CK/CKMB as well, but suspect an acute MI. Will d/w surgery to see if she can be heparinized, but doubt this would be an issue at this point post-operatively. Will need an ischemia work-up during this hospitalization, probably heart catheterization this coming week.   Pixie Casino, MD, [facility] Attending Cardiologist [place]

## 2014-07-24 NOTE — Progress Notes (Signed)
[place] for heparin Indication: chest pain/ACS  Allergies  Allergen Reactions  . Pirfenidone Diarrhea and Nausea And Vomiting  . Aspirin     rash    Patient Measurements: Height: 5\' 1"  (154.9 cm) Weight: 163 lb 2.3 oz (74 kg) IBW/kg (Calculated) : 47.8 Heparin Dosing Weight: 65kg  Vital Signs: Temp: 100.2 F (37.9 C) (01/10 1830) Temp Source: Oral (01/10 1830) BP: 167/71 mmHg (01/10 1830) Pulse Rate: 79 (01/10 1830)  Labs:  Recent Labs  07/22/14 0240 07/23/14 0407 07/24/14 0530 07/24/14 0957 07/24/14 1453 07/24/14 2007  HGB 10.7* 10.1* 9.9*  --   --   --   HCT 33.2* 31.9* 30.9*  --   --   --   PLT 195 200 239  --   --   --   HEPARINUNFRC  --   --   --   --   --  0.54  CREATININE 0.92 0.79 0.76  --   --   --   CKTOTAL  --   --   --   --  920*  --   CKMB  --   --   --   --  7.0*  --   TROPONINI  --   --   --  0.51* 0.57*  --     Estimated Creatinine Clearance: 52.5 mL/min (by C-G formula based on Cr of 0.76).   Assessment: 81 year old woman s/p partial gastrectomy and feeding jejunostomy for gastric cancer 07/20/14.  Initial heparin level therapeutic for ACS at 0.54  Goal of Therapy:  Heparin level 0.3-0.7 units/ml Monitor platelets by anticoagulation protocol: Yes   Plan:  Continue heparin at 900 units / hr Next lab check in AM  Thank you. Anette Guarneri, PharmD [phone]  07/24/2014,8:31 PM

## 2014-07-24 NOTE — Progress Notes (Signed)
CRITICAL VALUE ALERT  Critical value received:  Triponin 0.51   Date of notification:  07/24/14   Time of notification:  7253  Critical value read back:Yes.    Nurse who received alert:  [name] Silvers, RN   MD notified (1st page):  Dr. Debara Pickett  Time of first page:  1145  MD notified (2nd page):  Time of second page:  Responding MD:  Dr. Debara Pickett   Time MD responded:  1150

## 2014-07-24 NOTE — Progress Notes (Signed)
ANTICOAGULATION CONSULT NOTE - Initial Consult  Pharmacy Consult for heparin Indication: chest pain/ACS  Allergies  Allergen Reactions  . Pirfenidone Diarrhea and Nausea And Vomiting  . Aspirin     rash    Patient Measurements: Height: 5\' 1"  (154.9 cm) Weight: 163 lb 2.3 oz (74 kg) IBW/kg (Calculated) : 47.8 Heparin Dosing Weight: 65kg  Vital Signs: Temp: 98.8 F (37.1 C) (01/10 0938) Temp Source: Oral (01/10 0938) BP: 147/51 mmHg (01/10 0938) Pulse Rate: 68 (01/10 0938)  Labs:  Recent Labs  07/22/14 0240 07/23/14 0407 07/24/14 0530 07/24/14 0957  HGB 10.7* 10.1* 9.9*  --   HCT 33.2* 31.9* 30.9*  --   PLT 195 200 239  --   CREATININE 0.92 0.79 0.76  --   TROPONINI  --   --   --  0.51*    Estimated Creatinine Clearance: 52.5 mL/min (by C-G formula based on Cr of 0.76).   Medical History: Past Medical History  Diagnosis Date  . HYPERCHOLESTEROLEMIA 02/01/2008  . ANXIETY 03/08/2008  . PERIPHERAL NEUROPATHY 02/23/2007  . HYPERTENSION 03/08/2008  . GERD 02/23/2007  . DIVERTICULOSIS, COLON 03/08/2008  . OSTEOARTHRITIS 02/23/2007  . OSTEOPOROSIS 02/23/2007  . Urolithiasis   . Allergy     SEASONAL  . Cataract     REMOVED BILATERAL  . DIABETES MELLITUS, TYPE I 02/23/2007  . Depression     Medications:  Scheduled:  . antiseptic oral rinse  7 mL Mouth Rinse BID  . insulin aspart  0-20 Units Subcutaneous 6 times per day  . insulin glargine  20 Units Subcutaneous QHS  . metoprolol tartrate  25 mg Per Tube BID  . morphine   Intravenous 6 times per day  . pantoprazole  40 mg Oral Daily    Assessment: 81 year old woman s/p partial gastrectomy and feeding jejunostomy for gastric cancer 07/20/14.  She had some asymptomatic NSVT this morning and troponin (0.51) was found to be elevated.  Starting heparin now for ACS.  Goal of Therapy:  Heparin level 0.3-0.7 units/ml Monitor platelets by anticoagulation protocol: Yes   Plan:  Give 4000 units bolus x 1 Start heparin  infusion at 900 units/hr Check anti-Xa level in 8 hours and daily while on heparin Continue to monitor H&H and platelets  Candie Mile 07/24/2014,12:10 PM

## 2014-07-24 NOTE — Progress Notes (Signed)
CRITICAL VALUE ALERT  Critical value received: ckmb 7.0, triponin 0.57   Date of notification:  07/24/14  Time of notification:  1640  Critical value read back:Yes.    Nurse who received alert:  [name] Silvers, RN  MD notified (1st page):  Debara Pickett, MD  Time of first page:  1642  MD notified (2nd page):Elias Else, MD  Time of second page:1800  Responding MD:  Elias Else, MD  Time MD responded:  [phone]

## 2014-07-24 NOTE — Progress Notes (Signed)
4 Days Post-Op  Subjective: No flatus or BM.  Tolerating clear liquids ok.  No nausea.  Cardiac event noted.    Objective: Vital signs in last 24 hours: Temp:  [98.4 F (36.9 C)-100.5 F (38.1 C)] 99 F (37.2 C) (01/10 0500) Pulse Rate:  [70-77] 70 (01/10 0500) Resp:  [14-20] 19 (01/10 0500) BP: (150-172)/(60-97) 150/62 mmHg (01/10 0500) SpO2:  [97 %-99 %] 99 % (01/10 0500) Last BM Date: 07/20/14  Intake/Output from previous day: 01/09 0701 - 01/10 0700 In: 2077.9 [P.O.:50; I.V.:1632.3; NG/GT:395.7] Out: 2020 [Urine:1925; Drains:95] Intake/Output this shift:    PE: General- In NAD Abdomen-soft, hypoactive bowel sounds, incision clean and intact  Lab Results:   Recent Labs  07/23/14 0407 07/24/14 0530  WBC 13.3* 12.2*  HGB 10.1* 9.9*  HCT 31.9* 30.9*  PLT 200 239   BMET  Recent Labs  07/23/14 0407 07/24/14 0530  NA 141 141  K 4.3 4.2  CL 110 110  CO2 28 26  GLUCOSE 228* 200*  BUN 13 12  CREATININE 0.79 0.76  CALCIUM 9.3 9.3   PT/INR No results for input(s): LABPROT, INR in the last 72 hours. Comprehensive Metabolic Panel:    Component Value Date/Time   NA 141 07/24/2014 0530   NA 141 07/23/2014 0407   K 4.2 07/24/2014 0530   K 4.3 07/23/2014 0407   CL 110 07/24/2014 0530   CL 110 07/23/2014 0407   CO2 26 07/24/2014 0530   CO2 28 07/23/2014 0407   BUN 12 07/24/2014 0530   BUN 13 07/23/2014 0407   CREATININE 0.76 07/24/2014 0530   CREATININE 0.79 07/23/2014 0407   GLUCOSE 200* 07/24/2014 0530   GLUCOSE 228* 07/23/2014 0407   CALCIUM 9.3 07/24/2014 0530   CALCIUM 9.3 07/23/2014 0407   CALCIUM 9.9 07/23/2011 0858   AST 48* 05/31/2014 1603   AST 27 08/06/2013 0946   ALT 45* 05/31/2014 1603   ALT 27 08/06/2013 0946   ALKPHOS 66 05/31/2014 1603   ALKPHOS 74 08/06/2013 0946   BILITOT 0.6 05/31/2014 1603   BILITOT 0.4 08/06/2013 0946   PROT 7.5 05/31/2014 1603   PROT 7.5 08/06/2013 0946   ALBUMIN 3.7 05/31/2014 1603   ALBUMIN 3.7 08/06/2013  0946     Studies/Results: Dg Ugi W/water Sol Cm  07/22/2014   CLINICAL DATA:  81 year old female status post partial gastrectomy and primary reanastomosis of the esophagus and stomach remnant, for treatment of gastric cancer along the lesser curve. Postoperative contrast swallow. Initial encounter.  EXAM: WATER SOLUBLE UPPER GI SERIES  TECHNIQUE: Single-column upper GI series was performed using water soluble contrast.  CONTRAST:  60mL OMNIPAQUE IOHEXOL 300 MG/ML  SOLN  COMPARISON:  CT Abdomen and Pelvis 06/08/2014.  FLUOROSCOPY TIME:  1 min 30 seconds  FINDINGS: The patient was first given a small volume of PO water which she tolerated without difficulty.  Next she was given small sips of Omnipaque contrast.  An NG tube is in place. A postoperative drain is in place along the residual lesser curve. Cholecystectomy clips. Partially visible Hughes-tube.  No obstruction to the forward flow of contrast throughout the esophagus and into the stomach. Mildly patulous esophagus. Satisfactory appearance of the gastroesophageal junction. Prompt contrast transit to the distal stomach. No abnormal contrast accumulation or leak identified.  However, intermittent gastroesophageal reflux occurred throughout the study. Reflux was to the level of the thoracic inlet.  At the conclusion there is contrast transit through the duodenum to the ligament of Treitz, which  appear normal.  IMPRESSION: Satisfactory postoperative appearance of partial gastrectomy and reanastomosis. A patulous esophagus with frequent gastroesophageal reflux is noted.   Electronically Signed   By: Lars Pinks M.D.   On: 07/22/2014 10:43    Anti-infectives: Anti-infectives    Start     Dose/Rate Route Frequency Ordered Stop   07/20/14 1930  ceFAZolin (ANCEF) IVPB 1 g/50 mL premix     1 g100 mL/hr over 30 Minutes Intravenous Every 6 hours 07/20/14 1924 07/21/14 0740   07/20/14 1926  ceFAZolin (ANCEF) 1-5 GM-% IVPB    Comments:  Harris, Celine   : cabinet  override      07/20/14 1926 07/21/14 0729   07/20/14 0600  ceFAZolin (ANCEF) IVPB 2 g/50 mL premix     2 g100 mL/hr over 30 Minutes Intravenous On call to O.R. 07/19/14 1405 07/20/14 0858      Assessment Active Problems:   Gastric cancer s/p partial gastrectomy and feeding jejunostomy 07/20/14-UGI yesterday negative for leak-tolerating clear liquids and tube feeds (Glucerna)  Diabetes Mellitus-CBGs 186-233  HTN  V-tach- work up in progress by Cardiology; Metoprolol started.    LOS: 4 days   Plan: Full liquids.  Increase tube feeds.   Tammy Hughes 07/24/2014

## 2014-07-24 NOTE — Consult Note (Signed)
CONSULTATION NOTE  Reason for Consult: VT  Requesting Physician: Dr. Barry Dienes  Cardiologist: None (NEW)  HPI: This is a 81 y.o. female with a past medical history significant for DM2, dyslipidemia, and gastric cancer - no known history of CAD. She is POD #3 today and getting tube feeds, without resumption of bowel function as of yet. She was noted overnight to have an asymptomatic 18 beat run of NSVT, which self-terminated. She denies any recent chest pain or worsening shortness of breath. Her last EKG in the system was in 10/2013, which showed NSR with borderline LVH.  PMHx:  Past Medical History  Diagnosis Date  . HYPERCHOLESTEROLEMIA 02/01/2008  . ANXIETY 03/08/2008  . PERIPHERAL NEUROPATHY 02/23/2007  . HYPERTENSION 03/08/2008  . GERD 02/23/2007  . DIVERTICULOSIS, COLON 03/08/2008  . OSTEOARTHRITIS 02/23/2007  . OSTEOPOROSIS 02/23/2007  . Urolithiasis   . Allergy     SEASONAL  . Cataract     REMOVED BILATERAL  . DIABETES MELLITUS, TYPE I 02/23/2007  . Depression    Past Surgical History  Procedure Laterality Date  . Cholecystectomy  1995  . Esophagogastroduodenoscopy  06/08/2002  . Gastrectomy N/A 07/20/2014    Procedure: PROXIMAL GASTRECTOMY;  Surgeon: Stark Klein, MD;  Location: [facility];  Service: General;  Laterality: N/A;  . Laparoscopy N/A 07/20/2014    Procedure: LAPAROSCOPY DIAGNOSTIC;  Surgeon: Stark Klein, MD;  Location: [facility];  Service: General;  Laterality: N/A;  . Gastrojejunostomy N/A 07/20/2014    Procedure: GASTROJEJUNOSTOMY;  Surgeon: Stark Klein, MD;  Location: [facility];  Service: General;  Laterality: N/A;    FAMHx: Family History  Problem Relation Age of Onset  . Cancer Father     Prostate Cancer  . Cancer Sister     Breast Cancer  . Diabetes Sister   . Diabetes Sister     SOCHx:  reports that she has never smoked. She has never used smokeless tobacco. She reports that she does not drink alcohol or use illicit drugs.  ALLERGIES: Allergies    Allergen Reactions  . Pirfenidone Diarrhea and Nausea And Vomiting  . Aspirin     rash    ROS: A comprehensive review of systems was negative except for: Gastrointestinal: positive for abdominal pain and constipation  HOME MEDICATIONS: Prescriptions prior to admission  Medication Sig Dispense Refill Last Dose  . Calcium Carbonate-Vit D-Min (CALCIUM 600 + MINERALS) 600-200 MG-UNIT TABS Take 1 tablet by mouth daily.     07/19/2014 at Unknown time  . furosemide (LASIX) 20 MG tablet TAKE 1 TABLET BY MOUTH DAILY AS DIRECTED 90 tablet prn 07/19/2014 at Unknown time  . Insulin Aspart Prot & Aspart (NOVOLOG MIX 70/30 FLEXPEN) (70-30) 100 UNIT/ML Pen INJECT 70 UNITS            SUBCUTANEOUSLY EVERY DAY   WITH BREAKFAST AND 60 UNITSWITH THE EVENING MEAL 40 pen 1 07/19/2014 at Unknown time  . Insulin Pen Needle 31G X 8 MM MISC USE AS DIRECTED WITH INSULIN TWICE DAILY Dx: 250.01 200 each prn 07/19/2014  . omeprazole (PRILOSEC) 20 MG capsule Take 1 capsule (20 mg total) by mouth 2 (two) times daily before a meal. 180 capsule 1 07/19/2014 at Unknown time  . rosuvastatin (CRESTOR) 10 MG tablet Take 1 tablet (10 mg total) by mouth daily. 90 tablet 3 07/19/2014 at Unknown time  . diphenoxylate-atropine (LOMOTIL) 2.5-0.025 MG per tablet Take 1-2 tablets by mouth every 6 (six) hours as needed for diarrhea or loose stools. (Patient not  taking: Reported on 06/14/2014) 30 tablet 0 Not Taking  . HYDROcodone-acetaminophen (VICODIN) 5-500 MG per tablet Take 1 tablet by mouth every 4 (four) hours as needed. For pain (Patient not taking: Reported on 06/14/2014) 120 tablet 0 Not Taking  . triamcinolone cream (KENALOG) 0.1 % Apply 1 application topically 4 (four) times daily. As needed for itching (Patient not taking: Reported on 06/14/2014) 45 g 2 Not Taking    HOSPITAL MEDICATIONS: Prior to Admission:  Prescriptions prior to admission  Medication Sig Dispense Refill Last Dose  . Calcium Carbonate-Vit D-Min (CALCIUM 600 +  MINERALS) 600-200 MG-UNIT TABS Take 1 tablet by mouth daily.     07/19/2014 at Unknown time  . furosemide (LASIX) 20 MG tablet TAKE 1 TABLET BY MOUTH DAILY AS DIRECTED 90 tablet prn 07/19/2014 at Unknown time  . Insulin Aspart Prot & Aspart (NOVOLOG MIX 70/30 FLEXPEN) (70-30) 100 UNIT/ML Pen INJECT 70 UNITS            SUBCUTANEOUSLY EVERY DAY   WITH BREAKFAST AND 60 UNITSWITH THE EVENING MEAL 40 pen 1 07/19/2014 at Unknown time  . Insulin Pen Needle 31G X 8 MM MISC USE AS DIRECTED WITH INSULIN TWICE DAILY Dx: 250.01 200 each prn 07/19/2014  . omeprazole (PRILOSEC) 20 MG capsule Take 1 capsule (20 mg total) by mouth 2 (two) times daily before a meal. 180 capsule 1 07/19/2014 at Unknown time  . rosuvastatin (CRESTOR) 10 MG tablet Take 1 tablet (10 mg total) by mouth daily. 90 tablet 3 07/19/2014 at Unknown time  . diphenoxylate-atropine (LOMOTIL) 2.5-0.025 MG per tablet Take 1-2 tablets by mouth every 6 (six) hours as needed for diarrhea or loose stools. (Patient not taking: Reported on 06/14/2014) 30 tablet 0 Not Taking  . HYDROcodone-acetaminophen (VICODIN) 5-500 MG per tablet Take 1 tablet by mouth every 4 (four) hours as needed. For pain (Patient not taking: Reported on 06/14/2014) 120 tablet 0 Not Taking  . triamcinolone cream (KENALOG) 0.1 % Apply 1 application topically 4 (four) times daily. As needed for itching (Patient not taking: Reported on 06/14/2014) 45 g 2 Not Taking    VITALS: Blood pressure 150/62, pulse 70, temperature 99 F (37.2 C), temperature source Oral, resp. rate 19, height '5\' 1"'  (1.549 m), weight 163 lb 2.3 oz (74 kg), SpO2 99 %.  PHYSICAL EXAM: General appearance: alert and mild distress Neck: no carotid bruit and no JVD Lungs: clear to auscultation bilaterally Heart: regular rate and rhythm, S1, S2 normal, no murmur, click, rub or gallop Abdomen: soft, non-tender; bowel sounds normal; no masses,  no organomegaly Extremities: extremities normal, atraumatic, no cyanosis or  edema Pulses: 2+ and symmetric Skin: Skin color, texture, turgor normal. No rashes or lesions Neurologic: Grossly normal Psych: Pleasant  LABS: Results for orders placed or performed during the hospital encounter of 07/20/14 (from the past 48 hour(s))  Glucose, capillary     Status: Abnormal   Collection Time: 07/22/14 11:45 AM  Result Value Ref Range   Glucose-Capillary 226 (H) 70 - 99 mg/dL  Glucose, capillary     Status: Abnormal   Collection Time: 07/22/14  4:00 PM  Result Value Ref Range   Glucose-Capillary 242 (H) 70 - 99 mg/dL  Glucose, capillary     Status: Abnormal   Collection Time: 07/22/14  7:42 PM  Result Value Ref Range   Glucose-Capillary 250 (H) 70 - 99 mg/dL  Glucose, capillary     Status: Abnormal   Collection Time: 07/22/14 11:57 PM  Result Value  Ref Range   Glucose-Capillary 256 (H) 70 - 99 mg/dL  Glucose, capillary     Status: Abnormal   Collection Time: 07/23/14  3:58 AM  Result Value Ref Range   Glucose-Capillary 215 (H) 70 - 99 mg/dL  CBC     Status: Abnormal   Collection Time: 07/23/14  4:07 AM  Result Value Ref Range   WBC 13.3 (H) 4.0 - 10.5 K/uL   RBC 3.57 (L) 3.87 - 5.11 MIL/uL   Hemoglobin 10.1 (L) 12.0 - 15.0 g/dL   HCT 31.9 (L) 36.0 - 46.0 %   MCV 89.4 78.0 - 100.0 fL   MCH 28.3 26.0 - 34.0 pg   MCHC 31.7 30.0 - 36.0 g/dL   RDW 13.9 11.5 - 15.5 %   Platelets 200 150 - 400 K/uL  Basic metabolic panel     Status: Abnormal   Collection Time: 07/23/14  4:07 AM  Result Value Ref Range   Sodium 141 135 - 145 mmol/L    Comment: Please note change in reference range.   Potassium 4.3 3.5 - 5.1 mmol/L    Comment: Please note change in reference range.   Chloride 110 96 - 112 mEq/L   CO2 28 19 - 32 mmol/L   Glucose, Bld 228 (H) 70 - 99 mg/dL   BUN 13 6 - 23 mg/dL   Creatinine, Ser 0.79 0.50 - 1.10 mg/dL   Calcium 9.3 8.4 - 10.5 mg/dL   GFR calc non Af Amer 77 (L) >90 mL/min   GFR calc Af Amer 89 (L) >90 mL/min    Comment: (NOTE) The eGFR  has been calculated using the CKD EPI equation. This calculation has not been validated in all clinical situations. eGFR's persistently <90 mL/min signify possible Chronic Kidney Disease.    Anion gap 3 (L) 5 - 15  Glucose, capillary     Status: Abnormal   Collection Time: 07/23/14  7:27 AM  Result Value Ref Range   Glucose-Capillary 187 (H) 70 - 99 mg/dL  Glucose, capillary     Status: Abnormal   Collection Time: 07/23/14 11:42 AM  Result Value Ref Range   Glucose-Capillary 205 (H) 70 - 99 mg/dL  Glucose, capillary     Status: Abnormal   Collection Time: 07/23/14  4:03 PM  Result Value Ref Range   Glucose-Capillary 209 (H) 70 - 99 mg/dL  Glucose, capillary     Status: Abnormal   Collection Time: 07/23/14  7:35 PM  Result Value Ref Range   Glucose-Capillary 233 (H) 70 - 99 mg/dL   Comment 1 Notify RN   Glucose, capillary     Status: Abnormal   Collection Time: 07/23/14 11:53 PM  Result Value Ref Range   Glucose-Capillary 205 (H) 70 - 99 mg/dL   Comment 1 Notify RN   Glucose, capillary     Status: Abnormal   Collection Time: 07/24/14  3:45 AM  Result Value Ref Range   Glucose-Capillary 170 (H) 70 - 99 mg/dL   Comment 1 Notify RN   CBC     Status: Abnormal   Collection Time: 07/24/14  5:30 AM  Result Value Ref Range   WBC 12.2 (H) 4.0 - 10.5 K/uL   RBC 3.51 (L) 3.87 - 5.11 MIL/uL   Hemoglobin 9.9 (L) 12.0 - 15.0 g/dL   HCT 30.9 (L) 36.0 - 46.0 %   MCV 88.0 78.0 - 100.0 fL   MCH 28.2 26.0 - 34.0 pg   MCHC 32.0 30.0 - 36.0  g/dL   RDW 13.6 11.5 - 15.5 %   Platelets 239 150 - 400 K/uL  Basic metabolic panel     Status: Abnormal   Collection Time: 07/24/14  5:30 AM  Result Value Ref Range   Sodium 141 135 - 145 mmol/L    Comment: Please note change in reference range.   Potassium 4.2 3.5 - 5.1 mmol/L    Comment: Please note change in reference range.   Chloride 110 96 - 112 mEq/L   CO2 26 19 - 32 mmol/L   Glucose, Bld 200 (H) 70 - 99 mg/dL   BUN 12 6 - 23 mg/dL    Creatinine, Ser 0.76 0.50 - 1.10 mg/dL   Calcium 9.3 8.4 - 10.5 mg/dL   GFR calc non Af Amer 78 (L) >90 mL/min   GFR calc Af Amer >90 >90 mL/min    Comment: (NOTE) The eGFR has been calculated using the CKD EPI equation. This calculation has not been validated in all clinical situations. eGFR's persistently <90 mL/min signify possible Chronic Kidney Disease.    Anion gap 5 5 - 15  Glucose, capillary     Status: Abnormal   Collection Time: 07/24/14  7:53 AM  Result Value Ref Range   Glucose-Capillary 186 (H) 70 - 99 mg/dL    IMAGING: Dg Ugi W/water Sol Cm  07/22/2014   CLINICAL DATA:  81 year old female status post partial gastrectomy and primary reanastomosis of the esophagus and stomach remnant, for treatment of gastric cancer along the lesser curve. Postoperative contrast swallow. Initial encounter.  EXAM: WATER SOLUBLE UPPER GI SERIES  TECHNIQUE: Single-column upper GI series was performed using water soluble contrast.  CONTRAST:  40m OMNIPAQUE IOHEXOL 300 MG/ML  SOLN  COMPARISON:  CT Abdomen and Pelvis 06/08/2014.  FLUOROSCOPY TIME:  1 min 30 seconds  FINDINGS: The patient was first given a small volume of PO water which she tolerated without difficulty.  Next she was given small sips of Omnipaque contrast.  An NG tube is in place. A postoperative drain is in place along the residual lesser curve. Cholecystectomy clips. Partially visible J-tube.  No obstruction to the forward flow of contrast throughout the esophagus and into the stomach. Mildly patulous esophagus. Satisfactory appearance of the gastroesophageal junction. Prompt contrast transit to the distal stomach. No abnormal contrast accumulation or leak identified.  However, intermittent gastroesophageal reflux occurred throughout the study. Reflux was to the level of the thoracic inlet.  At the conclusion there is contrast transit through the duodenum to the ligament of Treitz, which appear normal.  IMPRESSION: Satisfactory  postoperative appearance of partial gastrectomy and reanastomosis. A patulous esophagus with frequent gastroesophageal reflux is noted.   Electronically Signed   By: LLars PinksM.D.   On: 07/22/2014 10:43    HOSPITAL DIAGNOSES: Principal Problem:   Gastric cancer Active Problems:   HYPERCHOLESTEROLEMIA   Essential hypertension   NSVT (nonsustained ventricular tachycardia)   IMPRESSION: 1. NSVT, asymptomatic  RECOMMENDATION: 1. Mrs. TDepreehad an 18 beat run of NSVT overnight - she is apparently asymptomatic. She has a number of cardiac risk factors but did not undergo any preoperative cardiac work-up that I could find.  I would recommend a formal 12 lead EKG today. Check troponin x 2. Will order a 2D echo. If troponins are negative and echo shows preserved LV function, would consider outpatient stress testing in the office. Would also recommend starting metoprolol tartrate 25 mg BID for arrhythmia suppression.  Thanks for the consult.  Time Spent Directly with Patient: 30 minutes  Pixie Casino, MD, [facility] Attending Cardiologist [facility]  HILTY,Kenneth C 07/24/2014, 8:29 AM

## 2014-07-24 NOTE — Progress Notes (Signed)
Pt very agitated and anxious. Pt telling staff and family that she is "through with being here and having all this stuff attached to [her]".  RN, NT and pt's family at bedside to console. Pt refuses O2 and CBG check. Pt with history of anxiety. Paged on call and spoke with Dr. Rosendo Gros. Requested medication for anxiety. No new orders given at this time. Family updated. Will continue to monitor pt status. ~ Petra Kuba RN

## 2014-07-25 DIAGNOSIS — R5381 Other malaise: Secondary | ICD-10-CM

## 2014-07-25 DIAGNOSIS — I369 Nonrheumatic tricuspid valve disorder, unspecified: Secondary | ICD-10-CM

## 2014-07-25 DIAGNOSIS — C169 Malignant neoplasm of stomach, unspecified: Principal | ICD-10-CM

## 2014-07-25 LAB — BASIC METABOLIC PANEL
ANION GAP: 6 (ref 5–15)
BUN: 15 mg/dL (ref 6–23)
CHLORIDE: 104 meq/L (ref 96–112)
CO2: 24 mmol/L (ref 19–32)
Calcium: 9.2 mg/dL (ref 8.4–10.5)
Creatinine, Ser: 0.8 mg/dL (ref 0.50–1.10)
GFR calc non Af Amer: 68 mL/min — ABNORMAL LOW (ref >90)
GFR, EST AFRICAN AMERICAN: 79 mL/min — AB (ref >90)
Glucose, Bld: 293 mg/dL — ABNORMAL HIGH (ref 70–99)
Potassium: 4.3 mmol/L (ref 3.5–5.1)
Sodium: 134 mmol/L — ABNORMAL LOW (ref 135–145)

## 2014-07-25 LAB — CBC
HCT: 32 % — ABNORMAL LOW (ref 36.0–46.0)
Hemoglobin: 10.5 g/dL — ABNORMAL LOW (ref 12.0–15.0)
MCH: 28.4 pg (ref 26.0–34.0)
MCHC: 32.8 g/dL (ref 30.0–36.0)
MCV: 86.5 fL (ref 78.0–100.0)
Platelets: 270 10*3/uL (ref 150–400)
RBC: 3.7 MIL/uL — AB (ref 3.87–5.11)
RDW: 13.6 % (ref 11.5–15.5)
WBC: 11.2 10*3/uL — ABNORMAL HIGH (ref 4.0–10.5)

## 2014-07-25 LAB — GLUCOSE, CAPILLARY
GLUCOSE-CAPILLARY: 165 mg/dL — AB (ref 70–99)
Glucose-Capillary: 166 mg/dL — ABNORMAL HIGH (ref 70–99)
Glucose-Capillary: 191 mg/dL — ABNORMAL HIGH (ref 70–99)
Glucose-Capillary: 216 mg/dL — ABNORMAL HIGH (ref 70–99)
Glucose-Capillary: 229 mg/dL — ABNORMAL HIGH (ref 70–99)
Glucose-Capillary: 259 mg/dL — ABNORMAL HIGH (ref 70–99)

## 2014-07-25 LAB — HEPARIN LEVEL (UNFRACTIONATED): HEPARIN UNFRACTIONATED: 0.54 [IU]/mL (ref 0.30–0.70)

## 2014-07-25 MED ORDER — TRAMADOL HCL 50 MG PO TABS
50.0000 mg | ORAL_TABLET | Freq: Four times a day (QID) | ORAL | Status: DC | PRN
  Administered 2014-07-25 – 2014-07-27 (×3): 50 mg via ORAL
  Filled 2014-07-25 (×3): qty 1

## 2014-07-25 MED ORDER — ROSUVASTATIN CALCIUM 10 MG PO TABS
10.0000 mg | ORAL_TABLET | Freq: Every day | ORAL | Status: DC
  Administered 2014-07-25 – 2014-07-27 (×3): 10 mg via ORAL
  Filled 2014-07-25 (×4): qty 1

## 2014-07-25 MED ORDER — FUROSEMIDE 20 MG PO TABS
20.0000 mg | ORAL_TABLET | Freq: Every day | ORAL | Status: DC
  Administered 2014-07-25 – 2014-07-28 (×4): 20 mg via ORAL
  Filled 2014-07-25 (×4): qty 1

## 2014-07-25 MED ORDER — HALOPERIDOL LACTATE 5 MG/ML IJ SOLN: 2.0000 mg | Freq: Four times a day (QID) | INTRAMUSCULAR | Status: DC | PRN

## 2014-07-25 MED ORDER — HYDROCODONE-ACETAMINOPHEN 5-325 MG PO TABS: 1.0000 | ORAL_TABLET | ORAL | Status: DC | PRN

## 2014-07-25 MED ORDER — ACETAMINOPHEN 325 MG PO TABS
650.0000 mg | ORAL_TABLET | ORAL | Status: DC
  Administered 2014-07-25 – 2014-07-27 (×12): 650 mg via ORAL
  Filled 2014-07-25 (×12): qty 2

## 2014-07-25 MED ORDER — WHITE PETROLATUM GEL
Status: AC
  Administered 2014-07-25: 1
  Filled 2014-07-25: qty 1

## 2014-07-25 MED ORDER — DIPHENOXYLATE-ATROPINE 2.5-0.025 MG PO TABS: 1.0000 | ORAL_TABLET | Freq: Four times a day (QID) | ORAL | Status: DC | PRN

## 2014-07-25 MED ORDER — INSULIN GLARGINE 100 UNIT/ML ~~LOC~~ SOLN
26.0000 [IU] | Freq: Every day | SUBCUTANEOUS | Status: DC
  Administered 2014-07-25: 26 [IU] via SUBCUTANEOUS
  Filled 2014-07-25: qty 0.26

## 2014-07-25 NOTE — Progress Notes (Signed)
[facility] for heparin Indication: chest pain/ACS  Allergies  Allergen Reactions  . Pirfenidone Diarrhea and Nausea And Vomiting  . Aspirin     rash    Patient Measurements: Height: 5\' 1"  (154.9 cm) Weight: 163 lb 2.3 oz (74 kg) IBW/kg (Calculated) : 47.8 Heparin Dosing Weight: 65kg  Vital Signs: Temp: 98.7 F (37.1 C) (01/11 0954) Temp Source: Oral (01/11 0954) BP: 155/59 mmHg (01/11 0954) Pulse Rate: 78 (01/11 0954)  Labs:  Recent Labs  07/23/14 0407 07/24/14 0530 07/24/14 0957 07/24/14 1453 07/24/14 2007 07/25/14 0637  HGB 10.1* 9.9*  --   --   --  10.5*  HCT 31.9* 30.9*  --   --   --  32.0*  PLT 200 239  --   --   --  270  HEPARINUNFRC  --   --   --   --  0.54 0.54  CREATININE 0.79 0.76  --   --   --  0.80  CKTOTAL  --   --   --  920*  --   --   CKMB  --   --   --  7.0*  --   --   TROPONINI  --   --  0.51* 0.57* 0.60*  --     Estimated Creatinine Clearance: 52.5 mL/min (by C-G formula based on Cr of 0.8).   Assessment: 81 year old woman s/p partial gastrectomy and feeding jejunostomy for gastric cancer 07/20/14. Pt continues on heparin for NSTEMI. Trop up to 0.6. Plan for heart cath this week. Heparin level therapeutic on 900 units/hr. CBC stable  Goal of Therapy:  Heparin level 0.3-0.7 units/ml Monitor platelets by anticoagulation protocol: Yes   Plan:  Continue heparin at 900 units/hr Daily heparin level and CBC F/u plans for heart cath this week  Sherlon Handing, PharmD, BCPS Clinical pharmacist, pager [phone] 07/25/2014,11:43 AM

## 2014-07-25 NOTE — Progress Notes (Signed)
Patient ID: Tammy Hughes, female   DOB: [birth year], 81 y.o.   MRN: [id number] 5 Days Post-Op  Subjective: Full liquids started.  Had NSTEMI this weekend associated with 18 beat run of v tach.  Echo pending.    Objective: Vital signs in last 24 hours: Temp:  [98.8 F (37.1 C)-100.2 F (37.9 C)] 98.8 F (37.1 C) (01/11 0531) Pulse Rate:  [67-83] 77 (01/11 0531) Resp:  [17-26] 17 (01/11 0531) BP: (136-167)/(48-71) 153/63 mmHg (01/11 0531) SpO2:  [95 %-100 %] 98 % (01/11 0531) Last BM Date: 07/20/14  Intake/Output from previous day: 01/10 0701 - 01/11 0700 In: 2005.3 [P.O.:90; I.V.:1492.8; NG/GT:422.5] Out: 2075 [Urine:1975; Drains:100] Intake/Output this shift:    PE: General- In NAD Abdomen-soft, hypoactive bowel sounds, incision clean and intact  Lab Results:   Recent Labs  07/24/14 0530 07/25/14 0637  WBC 12.2* 11.2*  HGB 9.9* 10.5*  HCT 30.9* 32.0*  PLT 239 270   BMET  Recent Labs  07/24/14 0530 07/25/14 0637  NA 141 134*  K 4.2 4.3  CL 110 104  CO2 26 24  GLUCOSE 200* 293*  BUN 12 15  CREATININE 0.76 0.80  CALCIUM 9.3 9.2   PT/INR No results for input(s): LABPROT, INR in the last 72 hours. Comprehensive Metabolic Panel:    Component Value Date/Time   NA 134* 07/25/2014 0637   NA 141 07/24/2014 0530   K 4.3 07/25/2014 0637   K 4.2 07/24/2014 0530   CL 104 07/25/2014 0637   CL 110 07/24/2014 0530   CO2 24 07/25/2014 0637   CO2 26 07/24/2014 0530   BUN 15 07/25/2014 0637   BUN 12 07/24/2014 0530   CREATININE 0.80 07/25/2014 0637   CREATININE 0.76 07/24/2014 0530   GLUCOSE 293* 07/25/2014 0637   GLUCOSE 200* 07/24/2014 0530   CALCIUM 9.2 07/25/2014 0637   CALCIUM 9.3 07/24/2014 0530   CALCIUM 9.9 07/23/2011 0858   AST 48* 05/31/2014 1603   AST 27 08/06/2013 0946   ALT 45* 05/31/2014 1603   ALT 27 08/06/2013 0946   ALKPHOS 66 05/31/2014 1603   ALKPHOS 74 08/06/2013 0946   BILITOT 0.6 05/31/2014 1603   BILITOT 0.4 08/06/2013 0946   PROT  7.5 05/31/2014 1603   PROT 7.5 08/06/2013 0946   ALBUMIN 3.7 05/31/2014 1603   ALBUMIN 3.7 08/06/2013 0946     Studies/Results: No results found.  Anti-infectives: Anti-infectives    Start     Dose/Rate Route Frequency Ordered Stop   07/20/14 1930  ceFAZolin (ANCEF) IVPB 1 g/50 mL premix     1 g100 mL/hr over 30 Minutes Intravenous Every 6 hours 07/20/14 1924 07/21/14 0740   07/20/14 1926  ceFAZolin (ANCEF) 1-5 GM-% IVPB    Comments:  Harris, Celine   : cabinet override      07/20/14 1926 07/21/14 0729   07/20/14 0600  ceFAZolin (ANCEF) IVPB 2 g/50 mL premix     2 g100 mL/hr over 30 Minutes Intravenous On call to O.R. 07/19/14 1405 07/20/14 0858      Assessment Active Problems:   Gastric cancer s/p partial gastrectomy and feeding jejunostomy 07/20/14-UGI yesterday negative for leak-tolerating clear liquids and tube feeds (Glucerna)  Diabetes Mellitus-CBGs 186-233  HTN  V-tach- work up in progress by Cardiology; Metoprolol started.    LOS: 5 days   Plan: increase tube feeds. D/c pca Rehab consult Statin, beta blockade, heparin for MI Tylenol standing, tramadol PRN. PRN haldol.     [facility] 07/25/2014

## 2014-07-25 NOTE — Progress Notes (Signed)
Physical Therapy Treatment Patient Details Name: Tammy Hughes MRN: [id number] DOB: 20-Mar-1933 Today's Date: 07/25/2014    History of Present Illness 81 year old female who presents with gastric cancer s/p laproscopy with partial gastrectomy and j tube placement    PT Comments    Pt sleeping on arrival. Family reports she was up in chair and recently back to bed. Pt easily awakened and agreeable to education re: bed level exercises she (with family's assist) can perform between therapy sessions. (see below) Pt mobilized to sit EOB and worked on sitting balance (pt with posterior lean). Assisted back to bed due to fatigue and pt preference.    Follow Up Recommendations  CIR;Supervision/Assistance - 24 hour     Equipment Recommendations  Rolling walker with 5" wheels;3in1 (PT)    Recommendations for Other Services OT consult     Precautions / Restrictions Precautions Precautions: Other (comment) Precaution Comments: jp drain, gastrostomy    Mobility  Bed Mobility Overal bed mobility: Needs Assistance Bed Mobility: Rolling;Sidelying to Sit;Sit to Sidelying Rolling: Min assist Sidelying to sit: Mod assist     Sit to sidelying: Mod assist General bed mobility comments: vc for technique to minimize abd pain; HOB flat to simulate home environment; assist due to weakness and pain  Transfers Overall transfer level: Needs assistance Equipment used: None Transfers: Lateral/Scoot Transfers          Lateral/Scoot Transfers: Mod assist General transfer comment: scoot x 3 along EOB towards HOB prior to return to sidelying  Ambulation/Gait                 Stairs            Wheelchair Mobility    Modified Rankin (Stroke Patients Only)       Balance Overall balance assessment: Needs assistance Sitting-balance support: Bilateral upper extremity supported;Feet unsupported Sitting balance-Leahy Scale: Poor Sitting balance - Comments: posterior lean; able to  maintain balance with minguard assist for up to 10 seconds (pt very petite with feet not reaching the floor) Postural control: Posterior lean                          Cognition Arousal/Alertness: Awake/alert Behavior During Therapy: Flat affect Overall Cognitive Status: Within Functional Limits for tasks assessed                      Exercises General Exercises - Lower Extremity Straight Leg Raises: AROM;Both;10 reps Low Level/ICU Exercises Ankle Circles/Pumps: AROM;Both;10 reps Heel Slides: AROM;Both;5 reps Stabilized Bridging: AAROM;Both;10 reps (5 reps, rest, 5 reps (feet stabilized by PT))    General Comments General comments (skin integrity, edema, etc.): grandaughter present      Pertinent Vitals/Pain Pain Assessment: Faces Faces Pain Scale: Hurts even more Pain Location: abd Pain Intervention(s): Limited activity within patient's tolerance;Monitored during session;Repositioned    Home Living                      Prior Function            PT Goals (current goals can now be found in the care plan section) Acute Rehab PT Goals Patient Stated Goal: return home Progress towards PT goals: Progressing toward goals    Frequency  Min 3X/week    PT Plan Current plan remains appropriate    Co-evaluation             End of Session   Activity  Tolerance: Patient limited by fatigue Patient left: in bed;with call bell/phone within reach;with family/visitor present     Time: 1530-1601 PT Time Calculation (min) (ACUTE ONLY): 31 min  Charges:  $Therapeutic Exercise: 8-22 mins $Therapeutic Activity: 8-22 mins                    G Codes:      Birda Didonato 08/04/14, 4:09 PM Pager [phone]

## 2014-07-25 NOTE — Progress Notes (Signed)
Pt is an appropriate candidate to be admitted to inpt rehab when medically ready. I will follow up tomorrow. [phone]

## 2014-07-25 NOTE — Progress Notes (Signed)
Patient Name: Tammy Hughes Date of Encounter: 07/25/2014     Principal Problem:   Gastric cancer Active Problems:   HYPERCHOLESTEROLEMIA   Essential hypertension   NSVT (nonsustained ventricular tachycardia)    SUBJECTIVE  Denies any SOB or CP, mild abdominal tenderness  CURRENT MEDS . acetaminophen  650 mg Oral Q4H  . antiseptic oral rinse  7 mL Mouth Rinse BID  . furosemide  20 mg Oral Daily  . insulin aspart  0-20 Units Subcutaneous 6 times per day  . insulin glargine  26 Units Subcutaneous QHS  . metoprolol tartrate  25 mg Per Tube BID  . pantoprazole  40 mg Oral Daily  . rosuvastatin  10 mg Oral q1800    OBJECTIVE  Filed Vitals:   07/25/14 0401 07/25/14 0531 07/25/14 0903 07/25/14 0954  BP:  153/63  155/59  Pulse:  77  78  Temp:  98.8 F (37.1 C)  98.7 F (37.1 C)  TempSrc:  Oral  Oral  Resp: 18 17 22 16   Height:      Weight:      SpO2: 97% 98% 98% 97%    Intake/Output Summary (Last 24 hours) at 07/25/14 1203 Last data filed at 07/25/14 1151  Gross per 24 hour  Intake 2005.25 ml  Output   2070 ml  Net -64.75 ml   Filed Weights   07/20/14 0650 07/21/14 0800  Weight: 163 lb (73.936 kg) 163 lb 2.3 oz (74 kg)    PHYSICAL EXAM  General: Pleasant, NAD. Neuro: Alert and oriented X 3. Moves all extremities spontaneously. Psych: Normal affect. HEENT:  Normal  Neck: Supple without bruits or JVD. Lungs:  Resp regular and unlabored, bibasilar rale Heart: RRR no s3, s4, or murmurs. Abdomen: Soft, non-distended Epigastric dressing with drain noted Extremities: No clubbing, cyanosis or edema. DP/PT/Radials 2+ and equal bilaterally.  Accessory Clinical Findings  CBC  Recent Labs  07/24/14 0530 07/25/14 0637  WBC 12.2* 11.2*  HGB 9.9* 10.5*  HCT 30.9* 32.0*  MCV 88.0 86.5  PLT 239 875   Basic Metabolic Panel  Recent Labs  07/24/14 0530 07/24/14 0957 07/25/14 0637  NA 141  --  134*  K 4.2  --  4.3  CL 110  --  104  CO2 26  --  24   GLUCOSE 200*  --  293*  BUN 12  --  15  CREATININE 0.76  --  0.80  CALCIUM 9.3  --  9.2  MG  --  1.9  --    Liver Function Tests No results for input(s): AST, ALT, ALKPHOS, BILITOT, PROT, ALBUMIN in the last 72 hours. No results for input(s): LIPASE, AMYLASE in the last 72 hours. Cardiac Enzymes  Recent Labs  07/24/14 0957 07/24/14 1453 07/24/14 2007  CKTOTAL  --  920*  --   CKMB  --  7.0*  --   TROPONINI 0.51* 0.57* 0.60*    TELE NSR without significant ventricular ectopy    ECG  NSR with nonspecific T wave changes  Echocardiogram  pending    Radiology/Studies  Dg Ugi W/water Sol Cm  07/22/2014   CLINICAL DATA:  81 year old female status post partial gastrectomy and primary reanastomosis of the esophagus and stomach remnant, for treatment of gastric cancer along the lesser curve. Postoperative contrast swallow. Initial encounter.  EXAM: WATER SOLUBLE UPPER GI SERIES  TECHNIQUE: Single-column upper GI series was performed using water soluble contrast.  CONTRAST:  24mL OMNIPAQUE IOHEXOL 300 MG/ML  SOLN  COMPARISON:  CT Abdomen and Pelvis 06/08/2014.  FLUOROSCOPY TIME:  1 min 30 seconds  FINDINGS: The patient was first given a small volume of PO water which she tolerated without difficulty.  Next she was given small sips of Omnipaque contrast.  An NG tube is in place. A postoperative drain is in place along the residual lesser curve. Cholecystectomy clips. Partially visible J-tube.  No obstruction to the forward flow of contrast throughout the esophagus and into the stomach. Mildly patulous esophagus. Satisfactory appearance of the gastroesophageal junction. Prompt contrast transit to the distal stomach. No abnormal contrast accumulation or leak identified.  However, intermittent gastroesophageal reflux occurred throughout the study. Reflux was to the level of the thoracic inlet.  At the conclusion there is contrast transit through the duodenum to the ligament of Treitz, which  appear normal.  IMPRESSION: Satisfactory postoperative appearance of partial gastrectomy and reanastomosis. A patulous esophagus with frequent gastroesophageal reflux is noted.   Electronically Signed   By: Lars Pinks M.D.   On: 07/22/2014 10:43    ASSESSMENT AND PLAN  1. Gastric cancer s/p partial gastrectomy and feeding jejunostomy 07/20/2014  - no preop cardiac workup  2. Elevated troponin  - pending echocardiogram today, trop trended up since yesterday  - will discuss with Dr. Marlou Porch regarding cardiac catheterization, although if EF normal exam echo, may also do stress test. Will followup with echo result  - bibasilar rale, however unclear pulm edema vs atelectasis, no LE edema, continue home lasix for now.   3. Asymptomatic VT  - metoprolol started 4. HTN 5. DM2: BG close to 300, check A1C in am 6. Hyperlipidemia    Signed, Almyra Deforest PA-C Pager: [phone]  Personally seen and examined. Agree with above. ECHO normal EF, no WMA Minimally elevated troponin, mild post op NSTEMI with NSVT 18 beats.  Given small, relatively flat troponin delta, would continue with medical mgt.   - DC heparin tomorrow AM  - continue metoprolol 25 BID  - would be optimal to be on ASA/Plavix. Start if OK with surgery.  - Once she is post hospitalization, would consider outpatient stress testing.   - Discussed all options with family, husband, including invasive approach. Since she is not having any angina, Normal EF, ECG silent, we are in agreement to continue medical mgt. If situation clinically changes, may need invasive strategy.   - She and husband understand increased cardiovascular risk with current NSTEMI.   Will follow.  Candee Furbish, MD

## 2014-07-25 NOTE — Consult Note (Signed)
Physical Medicine and Rehabilitation Consult Reason for Consult: Debilitation/gastric cancer with laparoscopic partial gastrectomy Referring Physician: Dr. Barry Dienes   HPI: Tammy Hughes is a 81 y.o. right handed female with recent diagnosis of new gastric cancer followed by gastroenterology Dr. Deatra Ina that was incidentally discovered upon endoscopy to follow-up for esophageal stricture. Biopsies positive for adenocarcinoma. Patient independent prior to admission living with her husband. Admitted 07/20/2014 underwent proximal gastrectomy and feeding jejunostomy per Dr. Barry Dienes. Hospital course pain management. On 07/24/2014 was 18 beats of NSVT. EKG showed normal sinus rhythm with borderline LVH. There was no chest pain or shortness of breath. Cardiology service is consulted. Troponin mildly elevated 0.60. Echocardiogram is pending. Placed on low-dose metoprolol. Tube feeds have been initiated. Bouts of confusion and restlessness at night. Physical therapy evaluation completed 07/22/2014 recommendations CIR consult.   Review of Systems  Gastrointestinal: Positive for constipation.       GERD  Musculoskeletal: Positive for myalgias and joint pain.  Psychiatric/Behavioral: Positive for depression.       Anxiety  All other systems reviewed and are negative.  Past Medical History  Diagnosis Date  . HYPERCHOLESTEROLEMIA 02/01/2008  . ANXIETY 03/08/2008  . PERIPHERAL NEUROPATHY 02/23/2007  . HYPERTENSION 03/08/2008  . GERD 02/23/2007  . DIVERTICULOSIS, COLON 03/08/2008  . OSTEOARTHRITIS 02/23/2007  . OSTEOPOROSIS 02/23/2007  . Urolithiasis   . Allergy     SEASONAL  . Cataract     REMOVED BILATERAL  . DIABETES MELLITUS, TYPE I 02/23/2007  . Depression    Past Surgical History  Procedure Laterality Date  . Cholecystectomy  1995  . Esophagogastroduodenoscopy  06/08/2002  . Gastrectomy N/A 07/20/2014    Procedure: PROXIMAL GASTRECTOMY;  Surgeon: Stark Klein, MD;  Location: [place];  Service:  General;  Laterality: N/A;  . Laparoscopy N/A 07/20/2014    Procedure: LAPAROSCOPY DIAGNOSTIC;  Surgeon: Stark Klein, MD;  Location: [place];  Service: General;  Laterality: N/A;  . Gastrojejunostomy N/A 07/20/2014    Procedure: GASTROJEJUNOSTOMY;  Surgeon: Stark Klein, MD;  Location: [facility];  Service: General;  Laterality: N/A;   Family History  Problem Relation Age of Onset  . Cancer Father     Prostate Cancer  . Cancer Sister     Breast Cancer  . Diabetes Sister   . Diabetes Sister    Social History:  reports that she has never smoked. She has never used smokeless tobacco. She reports that she does not drink alcohol or use illicit drugs. Allergies:  Allergies  Allergen Reactions  . Pirfenidone Diarrhea and Nausea And Vomiting  . Aspirin     rash   Medications Prior to Admission  Medication Sig Dispense Refill  . Calcium Carbonate-Vit D-Min (CALCIUM 600 + MINERALS) 600-200 MG-UNIT TABS Take 1 tablet by mouth daily.      . furosemide (LASIX) 20 MG tablet TAKE 1 TABLET BY MOUTH DAILY AS DIRECTED 90 tablet prn  . Insulin Aspart Prot & Aspart (NOVOLOG MIX 70/30 FLEXPEN) (70-30) 100 UNIT/ML Pen INJECT 70 UNITS            SUBCUTANEOUSLY EVERY DAY   WITH BREAKFAST AND 60 UNITSWITH THE EVENING MEAL 40 pen 1  . Insulin Pen Needle 31G X 8 MM MISC USE AS DIRECTED WITH INSULIN TWICE DAILY Dx: 250.01 200 each prn  . omeprazole (PRILOSEC) 20 MG capsule Take 1 capsule (20 mg total) by mouth 2 (two) times daily before a meal. 180 capsule 1  . rosuvastatin (CRESTOR) 10 MG  tablet Take 1 tablet (10 mg total) by mouth daily. 90 tablet 3  . diphenoxylate-atropine (LOMOTIL) 2.5-0.025 MG per tablet Take 1-2 tablets by mouth every 6 (six) hours as needed for diarrhea or loose stools. (Patient not taking: Reported on 06/14/2014) 30 tablet 0  . HYDROcodone-acetaminophen (VICODIN) 5-500 MG per tablet Take 1 tablet by mouth every 4 (four) hours as needed. For pain (Patient not taking: Reported on 06/14/2014) 120  tablet 0  . triamcinolone cream (KENALOG) 0.1 % Apply 1 application topically 4 (four) times daily. As needed for itching (Patient not taking: Reported on 06/14/2014) 45 g 2    Home: Home Living Family/patient expects to be discharged to:: Private residence Living Arrangements: Spouse/significant other Available Help at Discharge: Family, Available 24 hours/day Type of Home: House Home Access: Level entry Home Layout: One level Home Equipment: None  Functional History: Prior Function Level of Independence: Independent Functional Status:  Mobility: Bed Mobility Overal bed mobility: Needs Assistance Bed Mobility: Rolling, Sidelying to Sit Rolling: Mod assist Sidelying to sit: Max assist General bed mobility comments: cues for sequence with pt trying to pull on therapist rather than rail and having difficulty achieving full sidelying with assist. max assist for side to sit to elevate trunk and bring legs off of bed Transfers Overall transfer level: Needs assistance Transfers: Sit to/from Stand, Stand Pivot Transfers Sit to Stand: Mod assist Stand pivot transfers: Min assist General transfer comment: cues for sequence with assist for elevation from surface with hand held assist to stand and pivot toward chair      ADL:    Cognition: Cognition Overall Cognitive Status: Impaired/Different from baseline Orientation Level: Oriented to person, Oriented to situation, Disoriented to place, Disoriented to time Cognition Arousal/Alertness: Awake/alert Behavior During Therapy: Flat affect Overall Cognitive Status: Impaired/Different from baseline Area of Impairment: Attention Current Attention Level: Sustained General Comments: pt with flat affect, very limited interraction  Blood pressure 153/63, pulse 77, temperature 98.8 F (37.1 C), temperature source Oral, resp. rate 22, height 5\' 1"  (1.549 m), weight 74 kg (163 lb 2.3 oz), SpO2 98 %. Physical Exam  Vitals reviewed. HENT:    Head: Normocephalic.  Eyes: EOM are normal.  Neck: Normal range of motion. Neck supple. No thyromegaly present.  Cardiovascular:  Cardiac rate controlled  Respiratory: Effort normal and breath sounds normal. No respiratory distress.  GI: Soft. Bowel sounds are normal. She exhibits no distension. There is tenderness.  Neurological:  Patient is a bit lethargic but arousable. She was able to provide her name and age. Followed simple commands. UES: grossly 4/5 prox to distal.LE: 3/5 HF and KE and 4/5 ankles bilaterally. No sensory deficits.   Skin:  Surgical site is dressed    Results for orders placed or performed during the hospital encounter of 07/20/14 (from the past 24 hour(s))  Troponin I     Status: Abnormal   Collection Time: 07/24/14  9:57 AM  Result Value Ref Range   Troponin I 0.51 (HH) <0.031 ng/mL  Magnesium     Status: None   Collection Time: 07/24/14  9:57 AM  Result Value Ref Range   Magnesium 1.9 1.5 - 2.5 mg/dL  Glucose, capillary     Status: Abnormal   Collection Time: 07/24/14 12:14 PM  Result Value Ref Range   Glucose-Capillary 252 (H) 70 - 99 mg/dL  Troponin I     Status: Abnormal   Collection Time: 07/24/14  2:53 PM  Result Value Ref Range   Troponin I 0.57 (  HH) <0.031 ng/mL   CK total and CKMB (cardiac)     Status: Abnormal   Collection Time: 07/24/14  2:53 PM  Result Value Ref Range   Total CK 920 (H) 7 - 177 U/L   CK, MB 7.0 (HH) 0.3 - 4.0 ng/mL   Relative Index 0.8 0.0 - 2.5  Glucose, capillary     Status: Abnormal   Collection Time: 07/24/14  4:09 PM  Result Value Ref Range   Glucose-Capillary 197 (H) 70 - 99 mg/dL  Glucose, capillary     Status: Abnormal   Collection Time: 07/24/14  7:49 PM  Result Value Ref Range   Glucose-Capillary 172 (H) 70 - 99 mg/dL   Comment 1 Notify RN   Troponin I     Status: Abnormal   Collection Time: 07/24/14  8:07 PM  Result Value Ref Range   Troponin I 0.60 (HH) <0.031 ng/mL  Heparin level (unfractionated)      Status: None   Collection Time: 07/24/14  8:07 PM  Result Value Ref Range   Heparin Unfractionated 0.54 0.30 - 0.70 IU/mL  CBC     Status: Abnormal   Collection Time: 07/25/14  6:37 AM  Result Value Ref Range   WBC 11.2 (H) 4.0 - 10.5 K/uL   RBC 3.70 (L) 3.87 - 5.11 MIL/uL   Hemoglobin 10.5 (L) 12.0 - 15.0 g/dL   HCT 32.0 (L) 36.0 - 46.0 %   MCV 86.5 78.0 - 100.0 fL   MCH 28.4 26.0 - 34.0 pg   MCHC 32.8 30.0 - 36.0 g/dL   RDW 13.6 11.5 - 15.5 %   Platelets 270 150 - 400 K/uL  Basic metabolic panel     Status: Abnormal   Collection Time: 07/25/14  6:37 AM  Result Value Ref Range   Sodium 134 (L) 135 - 145 mmol/L   Potassium 4.3 3.5 - 5.1 mmol/L   Chloride 104 96 - 112 mEq/L   CO2 24 19 - 32 mmol/L   Glucose, Bld 293 (H) 70 - 99 mg/dL   BUN 15 6 - 23 mg/dL   Creatinine, Ser 0.80 0.50 - 1.10 mg/dL   Calcium 9.2 8.4 - 10.5 mg/dL   GFR calc non Af Amer 68 (L) >90 mL/min   GFR calc Af Amer 79 (L) >90 mL/min   Anion gap 6 5 - 15  Heparin level (unfractionated)     Status: None   Collection Time: 07/25/14  6:37 AM  Result Value Ref Range   Heparin Unfractionated 0.54 0.30 - 0.70 IU/mL  Glucose, capillary     Status: Abnormal   Collection Time: 07/25/14  7:56 AM  Result Value Ref Range   Glucose-Capillary 216 (H) 70 - 99 mg/dL   No results found.  Assessment/Plan: Diagnosis: severe debility related to gastric cancer and subsequent gastrectomy 1. Does the need for close, 24 hr/day medical supervision in concert with the patient's rehab needs make it unreasonable for this patient to be served in a less intensive setting? Yes 2. Co-Morbidities requiring supervision/potential complications: dm with PN. NSVT, htn,  3. Due to bladder management, bowel management, safety, skin/wound care, disease management, medication administration, pain management and patient education, does the patient require 24 hr/day rehab nursing? Yes 4. Does the patient require coordinated care of a physician,  rehab nurse, PT (1-2 hrs/day, 5 days/week) and OT (1-2 hrs/day, 5 days/week) to address physical and functional deficits in the context of the above medical diagnosis(es)? Yes Addressing deficits in the  following areas: balance, endurance, locomotion, strength, transferring, bowel/bladder control, bathing, dressing, feeding, grooming, toileting and psychosocial support 5. Can the patient actively participate in an intensive therapy program of at least 3 hrs of therapy per day at least 5 days per week? Yes 6. The potential for patient to make measurable gains while on inpatient rehab is excellent 7. Anticipated functional outcomes upon discharge from inpatient rehab are modified independent and supervision  with PT, modified independent, supervision and min assist with OT, n/a with SLP. 8. Estimated rehab length of stay to reach the above functional goals is: 10-14 days 9. Does the patient have adequate social supports and living environment to accommodate these discharge functional goals? Yes 10. Anticipated D/C setting: Home 11. Anticipated post D/C treatments: [place] therapy 12. Overall Rehab/Functional Prognosis: excellent  RECOMMENDATIONS: This patient's condition is appropriate for continued rehabilitative care in the following setting: CIR Patient has agreed to participate in recommended program. Yes Note that insurance prior authorization may be required for reimbursement for recommended care.  Comment: Rehab Admissions Coordinator to follow up.  Thanks,  Meredith Staggers, MD, Mellody Drown     07/25/2014

## 2014-07-25 NOTE — Clinical Social Work Note (Signed)
FL2 completed by CSW for SNF placement if patient can not be admitted to inpatient rehab.  Jones Broom. Fenton, MSW, [place] 07/25/2014 4:51 PM

## 2014-07-25 NOTE — Progress Notes (Signed)
Echocardiogram 2D Echocardiogram has been performed.  Tammy Hughes 07/25/2014, 9:35 AM

## 2014-07-26 ENCOUNTER — Telehealth: Payer: Self-pay | Admitting: Cardiology

## 2014-07-26 DIAGNOSIS — R778 Other specified abnormalities of plasma proteins: Secondary | ICD-10-CM | POA: Diagnosis not present

## 2014-07-26 DIAGNOSIS — R7989 Other specified abnormal findings of blood chemistry: Secondary | ICD-10-CM

## 2014-07-26 DIAGNOSIS — Z889 Allergy status to unspecified drugs, medicaments and biological substances status: Secondary | ICD-10-CM

## 2014-07-26 DIAGNOSIS — E119 Type 2 diabetes mellitus without complications: Secondary | ICD-10-CM

## 2014-07-26 DIAGNOSIS — Z888 Allergy status to other drugs, medicaments and biological substances status: Secondary | ICD-10-CM

## 2014-07-26 LAB — GLUCOSE, CAPILLARY
GLUCOSE-CAPILLARY: 201 mg/dL — AB (ref 70–99)
GLUCOSE-CAPILLARY: 247 mg/dL — AB (ref 70–99)
GLUCOSE-CAPILLARY: 256 mg/dL — AB (ref 70–99)
Glucose-Capillary: 178 mg/dL — ABNORMAL HIGH (ref 70–99)
Glucose-Capillary: 215 mg/dL — ABNORMAL HIGH (ref 70–99)
Glucose-Capillary: 266 mg/dL — ABNORMAL HIGH (ref 70–99)

## 2014-07-26 LAB — HEMOGLOBIN A1C
Hgb A1c MFr Bld: 7.6 % — ABNORMAL HIGH (ref <5.7)
MEAN PLASMA GLUCOSE: 171 mg/dL — AB (ref <117)

## 2014-07-26 LAB — HEPARIN LEVEL (UNFRACTIONATED): HEPARIN UNFRACTIONATED: 0.34 [IU]/mL (ref 0.30–0.70)

## 2014-07-26 MED ORDER — INSULIN GLARGINE 100 UNIT/ML ~~LOC~~ SOLN
32.0000 [IU] | Freq: Every day | SUBCUTANEOUS | Status: DC
  Administered 2014-07-26 – 2014-07-27 (×2): 32 [IU] via SUBCUTANEOUS
  Filled 2014-07-26 (×2): qty 0.32

## 2014-07-26 MED ORDER — METOPROLOL TARTRATE 25 MG PO TABS
25.0000 mg | ORAL_TABLET | Freq: Two times a day (BID) | ORAL | Status: DC
  Administered 2014-07-26 – 2014-07-28 (×3): 25 mg via ORAL
  Filled 2014-07-26 (×5): qty 1

## 2014-07-26 MED ORDER — GLUCERNA 1.2 CAL PO LIQD
1000.0000 mL | ORAL | Status: DC
  Administered 2014-07-26 – 2014-07-28 (×3): 1000 mL
  Filled 2014-07-26 (×7): qty 1000

## 2014-07-26 MED ORDER — CLOPIDOGREL BISULFATE 75 MG PO TABS
75.0000 mg | ORAL_TABLET | Freq: Every day | ORAL | Status: DC
  Administered 2014-07-26 – 2014-07-28 (×3): 75 mg via ORAL
  Filled 2014-07-26 (×3): qty 1

## 2014-07-26 MED ORDER — BISACODYL 10 MG RE SUPP
10.0000 mg | Freq: Every day | RECTAL | Status: DC
  Administered 2014-07-26 – 2014-07-27 (×2): 10 mg via RECTAL
  Filled 2014-07-26 (×2): qty 1

## 2014-07-26 NOTE — Telephone Encounter (Signed)
Closed encounter °

## 2014-07-26 NOTE — Progress Notes (Signed)
I met with pt, her daughter, and her husband at bedside. They all agree to an inpt rehab admission when pt medically ready. Noted pt has had a suppository and enema today and daughter reports poor results thus far. Pt belching at this time. I will follow up tomorrow with hopeful admission to inpt rehab then. [phone]

## 2014-07-26 NOTE — Progress Notes (Signed)
Physical Therapy Treatment Patient Details Name: Tammy Hughes MRN: [id number] DOB: 1933-06-12 Today's Date: 07/26/2014    History of Present Illness 81 year old female who presents with gastric cancer s/p laproscopy with partial gastrectomy and j tube placement    PT Comments    Good progress today. Continues to require vc for sequencing/technique to lessen strain on abdomen. Instructional cues for safe use of RW initiated. Pt able to recall three exercises she was educated to do 1/11 and reported she did them this morning with her grandaughter.   Follow Up Recommendations  CIR;Supervision/Assistance - 24 hour     Equipment Recommendations  Rolling walker with 5" wheels;3in1 (PT)    Recommendations for Other Services OT consult     Precautions / Restrictions Precautions Precautions: Other (comment) Precaution Comments: jp drain, gastrostomy Restrictions Weight Bearing Restrictions: No    Mobility  Bed Mobility Overal bed mobility: Needs Assistance Bed Mobility: Rolling;Sidelying to Sit Rolling: Min assist Sidelying to sit: Mod assist       General bed mobility comments: vc for technique to minimize abd pain; HOB flat to simulate home environment; assist due to weakness and pain  Transfers Overall transfer level: Needs assistance Equipment used: None;Rolling walker (2 wheeled) Transfers: Sit to/from [facility] Sit to Stand: Mod assist;Min assist;+2 safety/equipment Stand pivot transfers: Min assist;+2 physical assistance;+2 safety/equipment       General transfer comment: stand-pivot no device with +2 assist bed to [facility]; stood x 2 from [facility] with RW +1  Ambulation/Gait Ambulation/Gait assistance: Min assist;+2 safety/equipment Ambulation Distance (Feet): 12 Feet Assistive device: Rolling walker (2 wheeled) Gait Pattern/deviations: Step-through pattern;Decreased stride length;Shuffle Gait velocity: very slow   General Gait Details: vc for  upright posture and to keep RW closer to her (RW in room is too tall for pt--needs youth RW); assist to steady and maneuver RW   Stairs            Wheelchair Mobility    Modified Rankin (Stroke Patients Only)       Balance     Sitting balance-Leahy Scale: Fair Sitting balance - Comments: hands in lap and feet unable to reach floor; no LOB   Standing balance support: Bilateral upper extremity supported Standing balance-Leahy Scale: Poor                      Cognition Arousal/Alertness: Awake/alert Behavior During Therapy: Flat affect Overall Cognitive Status: Within Functional Limits for tasks assessed                      Exercises      General Comments General comments (skin integrity, edema, etc.): daughter present      Pertinent Vitals/Pain Pain Assessment: 0-10 Pain Score: 7  Pain Location: abd Pain Intervention(s): Limited activity within patient's tolerance;Monitored during session;Premedicated before session;Repositioned    Home Living                      Prior Function            PT Goals (current goals can now be found in the care plan section) Acute Rehab PT Goals Patient Stated Goal: return home Progress towards PT goals: Progressing toward goals    Frequency  Min 3X/week    PT Plan Current plan remains appropriate    Co-evaluation             End of Session Equipment Utilized During Treatment: Gait belt  Activity Tolerance: Patient tolerated treatment well Patient left: with call bell/phone within reach;with family/visitor present;in chair;with nursing/sitter in room     Time: 1120-1145 PT Time Calculation (min) (ACUTE ONLY): 25 min  Charges:  $Gait Training: 8-22 mins $Therapeutic Activity: 8-22 mins                    G Codes:      Tammy Hughes 2014-08-17, 11:55 AM Pager [phone]

## 2014-07-26 NOTE — Progress Notes (Addendum)
Patient ID: Tammy Hughes, female   DOB: 09/05/1932, 81 y.o.   MRN: [id number] 6 Days Post-Op  Subjective: Doing much better with PCA off.    Objective: Vital signs in last 24 hours: Temp:  [98 F (36.7 C)-99.2 F (37.3 C)] 98 F (36.7 C) (01/12 0641) Pulse Rate:  [65-78] 65 (01/12 0641) Resp:  [16-22] 17 (01/12 0641) BP: (111-155)/(51-59) 144/59 mmHg (01/12 0641) SpO2:  [95 %-98 %] 97 % (01/12 0641) Last BM Date: 07/20/14  Intake/Output from previous day: 01/11 0701 - 01/12 0700 In: 2302 [P.O.:120; I.V.:1377; NG/GT:805] Out: 1655 [Urine:1575; Drains:80] Intake/Output this shift:    PE: General- In NAD Abdomen-soft, hypoactive bowel sounds, incision clean and intact.  JP serosang  Lab Results:   Recent Labs  07/24/14 0530 07/25/14 0637  WBC 12.2* 11.2*  HGB 9.9* 10.5*  HCT 30.9* 32.0*  PLT 239 270   BMET  Recent Labs  07/24/14 0530 07/25/14 0637  NA 141 134*  K 4.2 4.3  CL 110 104  CO2 26 24  GLUCOSE 200* 293*  BUN 12 15  CREATININE 0.76 0.80  CALCIUM 9.3 9.2   PT/INR No results for input(s): LABPROT, INR in the last 72 hours. Comprehensive Metabolic Panel:    Component Value Date/Time   NA 134* 07/25/2014 0637   NA 141 07/24/2014 0530   K 4.3 07/25/2014 0637   K 4.2 07/24/2014 0530   CL 104 07/25/2014 0637   CL 110 07/24/2014 0530   CO2 24 07/25/2014 0637   CO2 26 07/24/2014 0530   BUN 15 07/25/2014 0637   BUN 12 07/24/2014 0530   CREATININE 0.80 07/25/2014 0637   CREATININE 0.76 07/24/2014 0530   GLUCOSE 293* 07/25/2014 0637   GLUCOSE 200* 07/24/2014 0530   CALCIUM 9.2 07/25/2014 0637   CALCIUM 9.3 07/24/2014 0530   CALCIUM 9.9 07/23/2011 0858   AST 48* 05/31/2014 1603   AST 27 08/06/2013 0946   ALT 45* 05/31/2014 1603   ALT 27 08/06/2013 0946   ALKPHOS 66 05/31/2014 1603   ALKPHOS 74 08/06/2013 0946   BILITOT 0.6 05/31/2014 1603   BILITOT 0.4 08/06/2013 0946   PROT 7.5 05/31/2014 1603   PROT 7.5 08/06/2013 0946   ALBUMIN 3.7  05/31/2014 1603   ALBUMIN 3.7 08/06/2013 0946     Studies/Results: No results found.  Anti-infectives: Anti-infectives    Start     Dose/Rate Route Frequency Ordered Stop   07/20/14 1930  ceFAZolin (ANCEF) IVPB 1 g/50 mL premix     1 g100 mL/hr over 30 Minutes Intravenous Every 6 hours 07/20/14 1924 07/21/14 0740   07/20/14 1926  ceFAZolin (ANCEF) 1-5 GM-% IVPB    Comments:  Harris, Celine   : cabinet override      07/20/14 1926 07/21/14 0729   07/20/14 0600  ceFAZolin (ANCEF) IVPB 2 g/50 mL premix     2 g100 mL/hr over 30 Minutes Intravenous On call to O.R. 07/19/14 1405 07/20/14 0858      Assessment Active Problems:   Gastric cancer s/p partial gastrectomy and feeding jejunostomy 07/20/14-UGI yesterday negative for leak-tolerating clear liquids and tube feeds (Glucerna)  Diabetes Mellitus-CBGs   HTN  V-tach- work up in progress by Cardiology; Metoprolol started.    LOS: 6 days   Plan: increase tube feeds.  Rehab pending.   Statin, beta blockade, heparin for MI.  D/c heparin and start plavix.  Pt has aspirin allergy with rash.  Will see what cards wants to do.  Tylenol standing, tramadol PRN. PRN haldol.   Suppository today.  Once has BM, she can transfer to rehab.  Also, once tolerating feeds at goal, would move to cycle feeds.   Increase Lantus again.     [facility] 07/26/2014

## 2014-07-26 NOTE — Care Management Note (Signed)
07-26-14 Important message from Medicare given and explained to patient, who voiced understanding . Magdalen Spatz RN BSN

## 2014-07-26 NOTE — Progress Notes (Signed)
    Subjective:  No chest pain  Objective:  Vital Signs in the last 24 hours: Temp:  [98 F (36.7 C)-99.2 F (37.3 C)] 98 F (36.7 C) (01/12 0641) Pulse Rate:  [65-69] 65 (01/12 0641) Resp:  [17-19] 17 (01/12 0641) BP: (111-144)/(51-59) 144/59 mmHg (01/12 0641) SpO2:  [95 %-97 %] 97 % (01/12 0641)  Intake/Output from previous day:  Intake/Output Summary (Last 24 hours) at 07/26/14 1200 Last data filed at 07/26/14 0930  Gross per 24 hour  Intake   2422 ml  Output   1955 ml  Net    467 ml    Physical Exam: General appearance: alert, cooperative and no distress Lungs: clear to auscultation bilaterally Heart: regular rate and rhythm   Rate: 66  Rhythm: normal sinus rhythm  Lab Results:  Recent Labs  07/24/14 0530 07/25/14 0637  WBC 12.2* 11.2*  HGB 9.9* 10.5*  PLT 239 270    Recent Labs  07/24/14 0530 07/25/14 0637  NA 141 134*  K 4.2 4.3  CL 110 104  CO2 26 24  GLUCOSE 200* 293*  BUN 12 15  CREATININE 0.76 0.80    Recent Labs  07/24/14 1453 07/24/14 2007  TROPONINI 0.57* 0.60*   No results for input(s): INR in the last 72 hours.  Imaging: Imaging results have been reviewed  Cardiac Studies: Echo 07/25/14 EF 55%, no WMA  Assessment/Plan:          81 y.o. female with a past medical history significant for DM2, dyslipidemia, and gastric cancer - no known history of CAD. She was seen 07/24/14 post op Day #3 s/p gastrectomy for gastric cancer. She had 18 bts of NSVT on telemetry. Echo shows normal LVF with no WMA. She has no anginal symptoms. Her Troponin is slightly elevated.   Principal Problem:   Gastric cancer-s/p gastrectomy 07/20/14 Active Problems:   NSVT post-op   Elevated troponin post op - 0.6   Type 2 diabetes mellitus   HYPERCHOLESTEROLEMIA   Essential hypertension   Aspirin allergy   PLAN: She is allergic to ASA-(rash), continue Plavix, beta blocker, and statin. We will arrange for follow up as an OP. She'll need a Myoview once  she recovers from her surgery.   Kerin Ransom PA-C Beeper [phone] 07/26/2014, 12:00 PM   Personally seen and examined. Agree with above. Elevated Trop/post op NSTEMI EF normal Will continue metoprolol 25 BID (mild brady noted) Plavix No ASA with allergy OK with rehab/SNF Will see her back in clinic as outpatient. Dr. Debara Pickett Decide on timing of NUC stress then. Will sign off.   Candee Furbish, MD

## 2014-07-26 NOTE — Progress Notes (Signed)
NUTRITION FOLLOW UP  Intervention:    Continue Osmolite 1.2 at goal rate of 50 ml/h to provide 1440 kcals, 67 gm protein, 984 ml free water daily.  Nutrition Dx:   Inadequate oral intake related to inability to eat as evidenced by NPO status, resolved  Goal:   Intake to meet >90% of estimated nutrition needs, met  Monitor:   TF tolerance/adequacy, weight trend, labs, po intake  Assessment:   Patient admitted on 1/6 S/P laparoscopy with proximal gastrectomy and feeding jejunostomy.  - TF at goal rate. Pt is tolerating.  - Current diet is carbohydrate modified full liquids.  - Pt ate bites of ice cream, tomato soup, and macaroni and cheese this afternoon. She was complaining of "belching" afterwards. Pt also has been drinking juice and water. - Pt hopefully to be transferred to inpatient rehab tomorrow.  - Weight is stable  Labs: CBGs 166-215 Na low K WNL Mg WNL  Height: Ht Readings from Last 1 Encounters:  07/21/14 _0  (1.549 m)    Weight Status:   Wt Readings from Last 1 Encounters:  07/21/14 163 lb 2.3 oz (74 kg)    Re-estimated needs:  Kcal: 1300-1500 Protein: 65-80 gm Fluid: 1.5 L  Skin: closed incision on abdomen  Diet Order: Diet full liquid   Intake/Output Summary (Last 24 hours) at 07/26/14 1502 Last data filed at 07/26/14 1300  Gross per 24 hour  Intake   2270 ml  Output   1600 ml  Net    670 ml    Last BM: 1/12   Labs:   Recent Labs Lab 07/21/14 0535  07/23/14 0407 07/24/14 0530 07/24/14 0957 07/25/14 0637  NA 137  < > 141 141  --  134*  K 4.5  < > 4.3 4.2  --  4.3  CL 105  < > 110 110  --  104  CO2 28  < > 28 26  --  24  BUN 15  < > 13 12  --  15  CREATININE 0.91  < > 0.79 0.76  --  0.80  CALCIUM 8.9  < > 9.3 9.3  --  9.2  MG 2.0  --   --   --  1.9  --   PHOS 3.0  --   --   --   --   --   GLUCOSE 312*  < > 228* 200*  --  293*  < > = values in this interval not displayed.  CBG (last 3)   Recent Labs  07/26/14 0337  07/26/14 0727 07/26/14 1147  GLUCAP 178* 201* 215*    Scheduled Meds: . acetaminophen  650 mg Oral Q4H  . antiseptic oral rinse  7 mL Mouth Rinse BID  . bisacodyl  10 mg Rectal Daily  . clopidogrel  75 mg Oral Daily  . furosemide  20 mg Oral Daily  . insulin aspart  0-20 Units Subcutaneous 6 times per day  . insulin glargine  32 Units Subcutaneous QHS  . metoprolol tartrate  25 mg Oral BID  . pantoprazole  40 mg Oral Daily  . rosuvastatin  10 mg Oral q1800    Continuous Infusions: . feeding supplement (GLUCERNA 1.2 CAL) 1,000 mL (07/26/14 0958)     Laurette Schimke MS, RD, LDN

## 2014-07-27 LAB — GLUCOSE, CAPILLARY
GLUCOSE-CAPILLARY: 252 mg/dL — AB (ref 70–99)
Glucose-Capillary: 243 mg/dL — ABNORMAL HIGH (ref 70–99)
Glucose-Capillary: 176 mg/dL — ABNORMAL HIGH (ref 70–99)
Glucose-Capillary: 239 mg/dL — ABNORMAL HIGH (ref 70–99)
Glucose-Capillary: 182 mg/dL — ABNORMAL HIGH (ref 70–99)

## 2014-07-27 LAB — CBC
HCT: 31.9 % — ABNORMAL LOW (ref 36.0–46.0)
Hemoglobin: 10.5 g/dL — ABNORMAL LOW (ref 12.0–15.0)
MCH: 28.2 pg (ref 26.0–34.0)
MCHC: 32.9 g/dL (ref 30.0–36.0)
MCV: 85.8 fL (ref 78.0–100.0)
Platelets: 329 10*3/uL (ref 150–400)
RBC: 3.72 MIL/uL — ABNORMAL LOW (ref 3.87–5.11)
RDW: 13.5 % (ref 11.5–15.5)
WBC: 10.6 10*3/uL — ABNORMAL HIGH (ref 4.0–10.5)

## 2014-07-27 LAB — BASIC METABOLIC PANEL
Anion gap: 5 (ref 5–15)
BUN: 15 mg/dL (ref 6–23)
CALCIUM: 9.5 mg/dL (ref 8.4–10.5)
CO2: 29 mmol/L (ref 19–32)
Chloride: 102 mEq/L (ref 96–112)
Creatinine, Ser: 0.78 mg/dL (ref 0.50–1.10)
GFR, EST AFRICAN AMERICAN: 90 mL/min — AB (ref >90)
GFR, EST NON AFRICAN AMERICAN: 77 mL/min — AB (ref >90)
Glucose, Bld: 249 mg/dL — ABNORMAL HIGH (ref 70–99)
POTASSIUM: 3.7 mmol/L (ref 3.5–5.1)
SODIUM: 136 mmol/L (ref 135–145)

## 2014-07-27 MED ORDER — HYDROCODONE-ACETAMINOPHEN 7.5-325 MG/15ML PO SOLN
15.0000 mL | ORAL | Status: DC | PRN
  Administered 2014-07-27 – 2014-07-28 (×2): 15 mL via ORAL
  Filled 2014-07-27 (×2): qty 15

## 2014-07-27 NOTE — Progress Notes (Signed)
Inpatient Diabetes Program Recommendations  AACE/ADA: New Consensus Statement on Inpatient Glycemic Control (2013)  Target Ranges:  Prepandial:   less than 140 mg/dL      Peak postprandial:   less than 180 mg/dL (1-2 hours)      Critically ill patients:  140 - 180 mg/dL   Results for XIMENA, TODARO (MRN [id number]) as of 07/27/2014 11:16  Ref. Range 07/26/2014 07:27 07/26/2014 11:47 07/26/2014 15:35 07/26/2014 19:26 07/26/2014 23:43 07/27/2014 03:51 07/27/2014 07:44  Glucose-Capillary Latest Range: 70-99 mg/dL 201 (H) 215 (H) 266 (H) 247 (H) 256 (H) 243 (H) 176 (H)   Diabetes history: DM2 Outpatient Diabetes medications: 70/30 70 units QAM with breakfast, 70/30 60 units QPM with supper Current orders for Inpatient glycemic control: Lantus 32 units QHS, Novolog 0-20 units Q4H  Inpatient Diabetes Program Recommendations Insulin - Basal: Please consider increasing Lantus to 36 units QHS. Insulin - Meal Coverage: Please consider ordering tube feeding coverage. Recommend ordering Novolog 5 units Q4H for tube feeding coverage (in addition to Novolog correction scale).  Thanks, Barnie Alderman, RN, MSN, CCRN, CDE Diabetes Coordinator Inpatient Diabetes Program [phone] (Team Pager) [phone] ([place] office) [phone] [facility] office)

## 2014-07-27 NOTE — Discharge Instructions (Signed)
Care of a Feeding Tube People who have trouble swallowing or cannot take food or medicine by mouth are sometimes given feeding tubes. A feeding tube can go into the nose and down to the stomach or through the skin in the abdomen and into the stomach or small bowel. Some of the names of these feeding tubes are gastrostomy tubes, PEG lines, nasogastric tubes, and gastrojejunostomy tubes.  SUPPLIES NEEDED TO CARE FOR THE TUBE SITE  Clean gloves.  Clean wash cloth, gauze pads, or soft paper towel.  Cotton swabs.  Skin barrier ointment or cream.  Soap and water.  Pre-cut foam pads or gauze (that go around the tube).  Tube tape. TUBE SITE CARE 1. Have all supplies ready and available. 2. Wash hands well. 3. Put on clean gloves. 4. Remove the soiled foam pad or gauze, if present, that is found under the tube stabilizer. Change the foam pad or gauze daily or when soiled or moist. 5. Check the skin around the tube site for redness, rash, swelling, drainage, or extra tissue growth. If you notice any of these, call your caregiver. 6. Moisten gauze and cotton swabs with water and soap. 7. Wipe the area closest to the tube (right near the stoma) with cotton swabs. Wipe the surrounding skin with moistened gauze. Rinse with water. 8. Dry the skin and stoma site with a dry gauze pad or soft paper towel. Do not use antibiotic ointments at the tube site. 9. If the skin is red, apply a skin barrier cream or ointment (such as petroleum jelly) in a circular motion, using a cotton swab. The cream or ointment will provide a moisture barrier for the skin and helps with wound healing. 10. Apply a new pre-cut foam pad or gauze around the tube. Secure it with tape around the edges. If no drainage is present, foam pads or gauze may be left off. 11. Use tape or an anchoring device to fasten the feeding tube to the skin for comfort or as directed. Rotate where you tape the tube to avoid skin damage from the  adhesive. 12. Position the person in a semi-upright position (30-45 degree angle). 13. Throw away used supplies. 14. Remove gloves. 15. Wash hands. SUPPLIES NEEDED TO FLUSH A FEEDING TUBE  Clean gloves.  60 mL syringe (that connects to the feeding tube).  Towel.  Water. FLUSHING A FEEDING TUBE  1. Have all supplies ready and available. 2. Wash hands well. 3. Put on clean gloves. 4. Draw up 30 mL of water in the syringe. 5. Kink the feeding tube while disconnecting it from the feeding-bag tubing or while removing the plug at the end of the tube. Kinking closes the tube and prevents secretions in the tube from spilling out. 6. Insert the tip of the syringe into the end of the feeding tube. Release the kink. Slowly inject the water. 7. If unable to inject the water, the person with the feeding tube should lay on his or her left side. The tip of the tube may be against the stomach wall, blocking fluid flow. Changing positions may move the tip away from the stomach wall. After repositioning, try injecting the water again. 8. After injecting the water, remove the syringe. 9. Always flush before giving the first medicine, between medicines, and after the final medicine before starting a feeding. This prevents medicines from clogging the tube. 10. Throw away used supplies. 11. Remove gloves. 12. Wash hands. Document Released: 07/01/2005 Document Revised: 06/17/2012 Document Reviewed: 02/13/2012  ExitCare® Patient Information ©2015 [employer]. This information is not intended to replace advice given to you by your health care provider. Make sure you discuss any questions you have with your health care provider. ° °

## 2014-07-27 NOTE — Progress Notes (Signed)
Patient ID: Tammy Hughes, female   DOB: 08-26-1932, 81 y.o.   MRN: [id number] 7 Days Post-Op  Subjective: Had 5 stools yesterday and threw up a large amount this AM.    Objective: Vital signs in last 24 hours: Temp:  [98 F (36.7 C)-98.2 F (36.8 C)] 98.2 F (36.8 C) (01/13 1305) Pulse Rate:  [61-79] 65 (01/13 1305) Resp:  [18-20] 18 (01/13 1305) BP: (131-143)/(54-82) 131/82 mmHg (01/13 1305) SpO2:  [95 %-96 %] 95 % (01/13 1305) Last BM Date: 07/26/14  Intake/Output from previous day: 01/12 0701 - 01/13 0700 In: 88 [P.O.:360; NG/GT:1368] Out: 505 [Urine:450; Drains:55] Intake/Output this shift: Total I/O In: -  Out: 375 [Emesis/NG output:350; Drains:25]  PE: General- In NAD Abdomen-soft, hypoactive bowel sounds, incision clean and intact.  JP serosang  Lab Results:   Recent Labs  07/25/14 0637 07/27/14 0358  WBC 11.2* 10.6*  HGB 10.5* 10.5*  HCT 32.0* 31.9*  PLT 270 329   BMET  Recent Labs  07/25/14 0637 07/27/14 0358  NA 134* 136  K 4.3 3.7  CL 104 102  CO2 24 29  GLUCOSE 293* 249*  BUN 15 15  CREATININE 0.80 0.78  CALCIUM 9.2 9.5   PT/INR No results for input(s): LABPROT, INR in the last 72 hours. Comprehensive Metabolic Panel:    Component Value Date/Time   NA 136 07/27/2014 0358   NA 134* 07/25/2014 0637   K 3.7 07/27/2014 0358   K 4.3 07/25/2014 0637   CL 102 07/27/2014 0358   CL 104 07/25/2014 0637   CO2 29 07/27/2014 0358   CO2 24 07/25/2014 0637   BUN 15 07/27/2014 0358   BUN 15 07/25/2014 0637   CREATININE 0.78 07/27/2014 0358   CREATININE 0.80 07/25/2014 0637   GLUCOSE 249* 07/27/2014 0358   GLUCOSE 293* 07/25/2014 0637   CALCIUM 9.5 07/27/2014 0358   CALCIUM 9.2 07/25/2014 0637   CALCIUM 9.9 07/23/2011 0858   AST 48* 05/31/2014 1603   AST 27 08/06/2013 0946   ALT 45* 05/31/2014 1603   ALT 27 08/06/2013 0946   ALKPHOS 66 05/31/2014 1603   ALKPHOS 74 08/06/2013 0946   BILITOT 0.6 05/31/2014 1603   BILITOT 0.4 08/06/2013  0946   PROT 7.5 05/31/2014 1603   PROT 7.5 08/06/2013 0946   ALBUMIN 3.7 05/31/2014 1603   ALBUMIN 3.7 08/06/2013 0946     Studies/Results: No results found.  Anti-infectives: Anti-infectives    Start     Dose/Rate Route Frequency Ordered Stop   07/20/14 1930  ceFAZolin (ANCEF) IVPB 1 g/50 mL premix     1 g100 mL/hr over 30 Minutes Intravenous Every 6 hours 07/20/14 1924 07/21/14 0740   07/20/14 1926  ceFAZolin (ANCEF) 1-5 GM-% IVPB    Comments:  Harris, Celine   : cabinet override      07/20/14 1926 07/21/14 0729   07/20/14 0600  ceFAZolin (ANCEF) IVPB 2 g/50 mL premix     2 g100 mL/hr over 30 Minutes Intravenous On call to O.R. 07/19/14 1405 07/20/14 0858      Assessment Active Problems:   Gastric cancer s/p partial gastrectomy and feeding jejunostomy 07/20/14-UGI yesterday negative for leak-tolerating clear liquids and tube feeds (Glucerna)  Diabetes Mellitus-CBGs   HTN  V-tach- work up in progress by Cardiology; Metoprolol started.    LOS: 7 days   Plan: increase tube feeds.  Rehab pending.   Statin, beta blockade, plavix, heparin for MI.  Pt has aspirin allergy with rash.  Switch to liquid pain meds.   PRN haldol.   Hold on d/c today since emesis.  Go back to sips, then leave tube feeds going.  Once nausea resolves, pt can advance diet.   Will d/c JP today.  Staples can be removed prior to rehab. Hopefully will be ready for rehab tomorrow. Increase Lantus again.     [facility] 07/27/2014

## 2014-07-27 NOTE — Evaluation (Signed)
Occupational Therapy Evaluation Patient Details Name: Tammy Hughes MRN: [id number] DOB: 02/15/1933 Today's Date: 07/27/2014    History of Present Illness 81 year old female who presents with gastric cancer s/p laproscopy with partial gastrectomy and j tube placement   Clinical Impression   Pt admitted with above. She demonstrates the below listed deficits and will benefit from continued OT to maximize safety and independence with BADLs.  Pt presents to OT with generalized weakness.  OT eval limited by nausea and abdominal pain.  Currently, she requires mod - total A with BADLs, and min A for functional transfers.   Recommend CIR.  Family very supportive.       Follow Up Recommendations  CIR;Supervision/Assistance - 24 hour    Equipment Recommendations  3 in 1 bedside comode    Recommendations for Other Services       Precautions / Restrictions Precautions Precautions: Other (comment) Precaution Comments: jp drain, gastrostomy Restrictions Weight Bearing Restrictions: No      Mobility Bed Mobility Overal bed mobility: Needs Assistance Bed Mobility: Rolling;Sidelying to Sit Rolling: Min assist Sidelying to sit: Mod assist       General bed mobility comments: verbal cues for technique.  Pt with heavy reliance on rail.  Requires assist to move LEs off bed and to lift shoulders from bed   Transfers Overall transfer level: Needs assistance Equipment used: Rolling walker (2 wheeled) Transfers: Sit to/from [facility] Sit to Stand: Min assist Stand pivot transfers: Min assist       General transfer comment: Min A to steady and increased time.  Pt with complaint of dizziness     Balance Overall balance assessment: Needs assistance Sitting-balance support: Feet supported Sitting balance-Leahy Scale: Fair     Standing balance support: Bilateral upper extremity supported Standing balance-Leahy Scale: Poor                               ADL Overall ADL's : Needs assistance/impaired Eating/Feeding: Set up;Sitting;Bed level   Grooming: Wash/dry hands;Wash/dry face;Oral care;Set up;Sitting;Bed level   Upper Body Bathing: Moderate assistance;Sitting   Lower Body Bathing: Maximal assistance;Sit to/from stand   Upper Body Dressing : Moderate assistance;Sitting   Lower Body Dressing: Total assistance;Sit to/from stand   Toilet Transfer: Minimal assistance;Stand-pivot;BSC;RW   Toileting- Clothing Manipulation and Hygiene: Moderate assistance;Sit to/from stand       Functional mobility during ADLs: Minimal assistance;Rolling walker General ADL Comments: Pt limited by nausea, abdominal pain, and dizziness upon standing.  She did attempt to doff socks, but was unable to do so due to discomfort pain.        Vision                     Perception     Praxis      Pertinent Vitals/Pain Pain Assessment: Faces Faces Pain Scale: Hurts even more Pain Location: abdomen Pain Descriptors / Indicators: Aching (nausea) Pain Intervention(s): Limited activity within patient's tolerance;Repositioned;Relaxation     Hand Dominance     Extremity/Trunk Assessment Upper Extremity Assessment Upper Extremity Assessment: Generalized weakness   Lower Extremity Assessment Lower Extremity Assessment: Defer to PT evaluation   Cervical / Trunk Assessment Cervical / Trunk Assessment: Normal;Other exceptions (abdomen distended)   Communication Communication Communication: No difficulties   Cognition Arousal/Alertness: Awake/alert Behavior During Therapy: Flat affect Overall Cognitive Status: Within Functional Limits for tasks assessed  General Comments       Exercises       Shoulder Instructions      Home Living Family/patient expects to be discharged to:: Inpatient rehab Living Arrangements: Spouse/significant other Available Help at Discharge: Family;Available 24 hours/day Type  of Home: House Home Access: Level entry     Home Layout: One level               Home Equipment: None          Prior Functioning/Environment Level of Independence: Independent        Comments: Pt reports she was performing BADLs, driving and participating in community activities    OT Diagnosis: Generalized weakness   OT Problem List: Decreased strength;Decreased activity tolerance;Impaired balance (sitting and/or standing);Decreased knowledge of use of DME or AE;Decreased knowledge of precautions;Pain   OT Treatment/Interventions: Self-care/ADL training;DME and/or AE instruction;Therapeutic activities;Patient/family education;Balance training    OT Goals(Current goals can be found in the care plan section) Acute Rehab OT Goals Patient Stated Goal: to feel better OT Goal Formulation: With patient Time For Goal Achievement: 08/10/14 Potential to Achieve Goals: Good ADL Goals Pt Will Perform Grooming: with min guard assist;standing Pt Will Perform Upper Body Bathing: with set-up;with supervision;sitting Pt Will Perform Lower Body Bathing: with min guard assist;sit to/from stand Pt Will Perform Upper Body Dressing: with set-up;with supervision;sitting Pt Will Perform Lower Body Dressing: with min guard assist;sit to/from stand Pt Will Transfer to Toilet: with min guard assist;ambulating;regular height toilet;grab bars Pt Will Perform Toileting - Clothing Manipulation and hygiene: with min guard assist;sit to/from stand  OT Frequency: Min 2X/week   Barriers to D/C:            Co-evaluation              End of Session Equipment Utilized During Treatment: Surveyor, mining Communication: Mobility status  Activity Tolerance: Patient limited by pain;Other (comment) (nausea) Patient left: in chair;with call bell/phone within reach;with family/visitor present   Time: 9702-6378 OT Time Calculation (min): 15 min Charges:  OT General Charges $OT Visit: 1  Procedure OT Evaluation $Initial OT Evaluation Tier I: 1 Procedure OT Treatments $Therapeutic Activity: 8-22 mins G-Codes:    Frederick Klinger M 08/14/14, 1:19 PM

## 2014-07-27 NOTE — Progress Notes (Signed)
Pt took some FL diet and meds with applesauce, some burping noted. Abd is distended but soft.  At 1030 while up on BSC, pt had large emesis, yellow colored with 2 pills returned also. MD here and aware.

## 2014-07-27 NOTE — Discharge Summary (Signed)
Physician Discharge Summary  Patient ID: Tammy Hughes MRN: [id number] DOB/AGE: 1933-03-27 81 y.o.  Admit date: 07/20/2014 Discharge date: 07/28/2014  Admission Diagnoses:  Gastric cancer Type 2 DM Pulmonary fibrosis NASH THN Diverticulosis Neuropathy Osteoporosis osteoarthritis  Discharge Diagnoses:  Principal Problem:   Gastric cancer-s/p gastrectomy 07/20/14 Active Problems:   HYPERCHOLESTEROLEMIA   Essential hypertension   NSVT post-op   Elevated troponin post op - 0.6   Aspirin allergy   Type 2 diabetes mellitus   Discharged Condition: stable  Hospital Course:  Pt was admitted to the floor following proximal gastrectomy.  She was hypertensive and IV antihypertensives were added.  She had increasing needs for insulin.  She had a UGI on POD 3 to evaluate for leaks at her anastamosis which was negative.  She was started on clear liquids.  Trophic tube feeds were started on POD 2 and these were slowly advanced.  She was taken off her PCA due to lethargy and sundowning.  She was much more alert after this.  She was advanced to full liquids, and within 48 hours, she had a large episode of emesis.  She was backed off back to sips of clears and nausea resolved.  Over the weekend, she had an 18 beat run of v tach.  Cardiology was consulted and she was found to have an NSTEMI.  She was anticoagulated with heparin and transitioned to plavix.  She did not have any bleeding complications.  PT/OT were consulted and felt she would be a good rehab candidate due to living alone and taking care of herself prior to surgery.  She was able to urinate without foley.    Consults: cardiology  Significant Diagnostic Studies: labs: see epic  Treatments: IV hydration  Discharge Exam: Blood pressure 131/57, pulse 71, temperature 98.4 F (36.9 C), temperature source Oral, resp. rate 16, height 5\' 1"  (1.549 m), weight 163 lb 2.3 oz (74 kg), SpO2 98 %. General appearance: alert, cooperative and no  distress Resp: breathing comfortably Cardio: regular rate and rhythm GI: soft, approp tender, non distended. Extremities: extremities normal, atraumatic, no cyanosis or edema  Disposition:      Medication List    TAKE these medications        CALCIUM 600 + MINERALS 600-200 MG-UNIT Tabs  Take 1 tablet by mouth daily.     diphenoxylate-atropine 2.5-0.025 MG per tablet  Commonly known as:  LOMOTIL  Take 1-2 tablets by mouth every 6 (six) hours as needed for diarrhea or loose stools.     furosemide 20 MG tablet  Commonly known as:  LASIX  TAKE 1 TABLET BY MOUTH DAILY AS DIRECTED     HYDROcodone-acetaminophen 5-500 MG per tablet  Commonly known as:  VICODIN  Take 1 tablet by mouth every 4 (four) hours as needed. For pain     insulin aspart protamine - aspart (70-30) 100 UNIT/ML FlexPen  Commonly known as:  NOVOLOG MIX 70/30 FLEXPEN  INJECT 70 UNITS            SUBCUTANEOUSLY EVERY DAY   WITH BREAKFAST AND 60 UNITSWITH THE EVENING MEAL     Insulin Pen Needle 31G X 8 MM Misc  USE AS DIRECTED WITH INSULIN TWICE DAILY Dx: 250.01     omeprazole 20 MG capsule  Commonly known as:  PRILOSEC  Take 1 capsule (20 mg total) by mouth 2 (two) times daily before a meal.     rosuvastatin 10 MG tablet  Commonly known as:  CRESTOR  Take  1 tablet (10 mg total) by mouth daily.     triamcinolone cream 0.1 %  Commonly known as:  KENALOG  Apply 1 application topically 4 (four) times daily. As needed for itching       Follow-up Information    Follow up with Pixie Casino, MD.   Specialty:  Cardiology   Why:  office will call you   Contact information:   [place] [address] [phone]       Follow up with [facility], MD In 2 weeks.   Specialty:  General Surgery   Contact information:   [address] [place] [address] [phone]       Follow up with [name], MD.   Specialty:  Oncology   Why:  office will call you    Contact information:   [place] [address] [phone]       Signed: Stark Klein 07/28/2014, 8:26 AM

## 2014-07-27 NOTE — Progress Notes (Signed)
I met with pt and two daughters at bedside. They report that pt has been nauseated over the past 24 hours and her abdomen is more distended today. Poor appetite and belching. No good BM yet. I will follow up tomorrow. [phone]

## 2014-07-28 ENCOUNTER — Inpatient Hospital Stay (HOSPITAL_COMMUNITY)
Admission: RE | Admit: 2014-07-28 | Discharge: 2014-08-10 | DRG: 374 | Disposition: A | Discharge status: Home Health | Payer: Medicare Other | Source: Intra-hospital | Attending: Physical Medicine & Rehabilitation | Admitting: Physical Medicine & Rehabilitation

## 2014-07-28 DIAGNOSIS — E780 Pure hypercholesterolemia: Secondary | ICD-10-CM | POA: Diagnosis present

## 2014-07-28 DIAGNOSIS — E1143 Type 2 diabetes mellitus with diabetic autonomic (poly)neuropathy: Secondary | ICD-10-CM | POA: Diagnosis not present

## 2014-07-28 DIAGNOSIS — J189 Pneumonia, unspecified organism: Secondary | ICD-10-CM | POA: Diagnosis not present

## 2014-07-28 DIAGNOSIS — Y95 Nosocomial condition: Secondary | ICD-10-CM | POA: Diagnosis not present

## 2014-07-28 DIAGNOSIS — M810 Age-related osteoporosis without current pathological fracture: Secondary | ICD-10-CM | POA: Diagnosis present

## 2014-07-28 DIAGNOSIS — Z934 Other artificial openings of gastrointestinal tract status: Secondary | ICD-10-CM | POA: Diagnosis not present

## 2014-07-28 DIAGNOSIS — I10 Essential (primary) hypertension: Secondary | ICD-10-CM | POA: Diagnosis present

## 2014-07-28 DIAGNOSIS — I472 Ventricular tachycardia: Secondary | ICD-10-CM | POA: Diagnosis present

## 2014-07-28 DIAGNOSIS — K5900 Constipation, unspecified: Secondary | ICD-10-CM | POA: Diagnosis present

## 2014-07-28 DIAGNOSIS — F329 Major depressive disorder, single episode, unspecified: Secondary | ICD-10-CM | POA: Diagnosis present

## 2014-07-28 DIAGNOSIS — R0602 Shortness of breath: Secondary | ICD-10-CM | POA: Diagnosis not present

## 2014-07-28 DIAGNOSIS — E785 Hyperlipidemia, unspecified: Secondary | ICD-10-CM | POA: Diagnosis present

## 2014-07-28 DIAGNOSIS — K219 Gastro-esophageal reflux disease without esophagitis: Secondary | ICD-10-CM | POA: Diagnosis present

## 2014-07-28 DIAGNOSIS — F419 Anxiety disorder, unspecified: Secondary | ICD-10-CM | POA: Diagnosis present

## 2014-07-28 DIAGNOSIS — E1142 Type 2 diabetes mellitus with diabetic polyneuropathy: Secondary | ICD-10-CM | POA: Diagnosis present

## 2014-07-28 DIAGNOSIS — C169 Malignant neoplasm of stomach, unspecified: Secondary | ICD-10-CM | POA: Diagnosis not present

## 2014-07-28 DIAGNOSIS — R918 Other nonspecific abnormal finding of lung field: Secondary | ICD-10-CM | POA: Diagnosis not present

## 2014-07-28 DIAGNOSIS — E1140 Type 2 diabetes mellitus with diabetic neuropathy, unspecified: Secondary | ICD-10-CM | POA: Diagnosis not present

## 2014-07-28 DIAGNOSIS — Z903 Acquired absence of stomach [part of]: Secondary | ICD-10-CM | POA: Diagnosis present

## 2014-07-28 DIAGNOSIS — E1165 Type 2 diabetes mellitus with hyperglycemia: Secondary | ICD-10-CM | POA: Diagnosis present

## 2014-07-28 DIAGNOSIS — R5381 Other malaise: Secondary | ICD-10-CM | POA: Diagnosis present

## 2014-07-28 DIAGNOSIS — Z09 Encounter for follow-up examination after completed treatment for conditions other than malignant neoplasm: Secondary | ICD-10-CM

## 2014-07-28 DIAGNOSIS — R509 Fever, unspecified: Secondary | ICD-10-CM

## 2014-07-28 DIAGNOSIS — Z483 Aftercare following surgery for neoplasm: Secondary | ICD-10-CM | POA: Diagnosis not present

## 2014-07-28 LAB — GLUCOSE, CAPILLARY
GLUCOSE-CAPILLARY: 210 mg/dL — AB (ref 70–99)
GLUCOSE-CAPILLARY: 147 mg/dL — AB (ref 70–99)
Glucose-Capillary: 174 mg/dL — ABNORMAL HIGH (ref 70–99)
Glucose-Capillary: 197 mg/dL — ABNORMAL HIGH (ref 70–99)
Glucose-Capillary: 245 mg/dL — ABNORMAL HIGH (ref 70–99)
Glucose-Capillary: 271 mg/dL — ABNORMAL HIGH (ref 70–99)
Glucose-Capillary: 276 mg/dL — ABNORMAL HIGH (ref 70–99)

## 2014-07-28 MED ORDER — DIPHENOXYLATE-ATROPINE 2.5-0.025 MG PO TABS: 1.0000 | ORAL_TABLET | Freq: Four times a day (QID) | ORAL | Status: DC | PRN

## 2014-07-28 MED ORDER — INSULIN GLARGINE 100 UNIT/ML ~~LOC~~ SOLN
35.0000 [IU] | Freq: Every day | SUBCUTANEOUS | Status: DC
  Administered 2014-07-28 – 2014-08-01 (×5): 35 [IU] via SUBCUTANEOUS
  Filled 2014-07-28 (×6): qty 0.35

## 2014-07-28 MED ORDER — GLUCERNA 1.2 CAL PO LIQD
1000.0000 mL | ORAL | Status: DC
  Administered 2014-07-28 – 2014-08-02 (×5): 1000 mL
  Filled 2014-07-28 (×12): qty 1000

## 2014-07-28 MED ORDER — METOPROLOL TARTRATE 25 MG PO TABS
25.0000 mg | ORAL_TABLET | Freq: Two times a day (BID) | ORAL | Status: DC
  Administered 2014-07-28 – 2014-08-10 (×26): 25 mg via ORAL
  Filled 2014-07-28 (×28): qty 1

## 2014-07-28 MED ORDER — GLUCERNA 1.2 CAL PO LIQD
1000.0000 mL | ORAL | Status: DC
  Filled 2014-07-28 (×2): qty 1000

## 2014-07-28 MED ORDER — ONDANSETRON HCL 4 MG/2ML IJ SOLN: 4.0000 mg | Freq: Four times a day (QID) | INTRAMUSCULAR | Status: DC | PRN

## 2014-07-28 MED ORDER — FUROSEMIDE 20 MG PO TABS
20.0000 mg | ORAL_TABLET | Freq: Every day | ORAL | Status: DC
  Administered 2014-07-29 – 2014-08-10 (×13): 20 mg via ORAL
  Filled 2014-07-28 (×14): qty 1

## 2014-07-28 MED ORDER — INSULIN ASPART 100 UNIT/ML ~~LOC~~ SOLN
0.0000 [IU] | SUBCUTANEOUS | Status: DC
  Administered 2014-07-28: 3 [IU] via SUBCUTANEOUS
  Administered 2014-07-28: 11 [IU] via SUBCUTANEOUS
  Administered 2014-07-29: 4 [IU] via SUBCUTANEOUS
  Administered 2014-07-29 (×3): 7 [IU] via SUBCUTANEOUS
  Administered 2014-07-29: 11 [IU] via SUBCUTANEOUS
  Administered 2014-07-29 – 2014-07-30 (×2): 7 [IU] via SUBCUTANEOUS
  Administered 2014-07-30 (×2): 4 [IU] via SUBCUTANEOUS
  Administered 2014-07-30: 7 [IU] via SUBCUTANEOUS
  Administered 2014-07-30: 4 [IU] via SUBCUTANEOUS
  Administered 2014-07-30: 11 [IU] via SUBCUTANEOUS
  Administered 2014-07-31: 4 [IU] via SUBCUTANEOUS
  Administered 2014-07-31 (×3): 7 [IU] via SUBCUTANEOUS
  Administered 2014-07-31: 4 [IU] via SUBCUTANEOUS
  Administered 2014-07-31: 11 [IU] via SUBCUTANEOUS
  Administered 2014-08-01: 3 [IU] via SUBCUTANEOUS
  Administered 2014-08-01 (×4): 7 [IU] via SUBCUTANEOUS
  Administered 2014-08-01: 4 [IU] via SUBCUTANEOUS
  Administered 2014-08-02 (×3): 11 [IU] via SUBCUTANEOUS
  Administered 2014-08-02: 7 [IU] via SUBCUTANEOUS
  Administered 2014-08-03: 4 [IU] via SUBCUTANEOUS
  Administered 2014-08-03 (×5): 7 [IU] via SUBCUTANEOUS
  Administered 2014-08-04: 4 [IU] via SUBCUTANEOUS
  Administered 2014-08-04: 7 [IU] via SUBCUTANEOUS
  Administered 2014-08-04: 4 [IU] via SUBCUTANEOUS
  Administered 2014-08-04: 7 [IU] via SUBCUTANEOUS
  Administered 2014-08-04: 4 [IU] via SUBCUTANEOUS
  Administered 2014-08-05: 3 [IU] via SUBCUTANEOUS
  Administered 2014-08-05: 7 [IU] via SUBCUTANEOUS
  Administered 2014-08-05: 11 [IU] via SUBCUTANEOUS
  Administered 2014-08-05: 4 [IU] via SUBCUTANEOUS
  Administered 2014-08-05: 7 [IU] via SUBCUTANEOUS
  Administered 2014-08-06: 3 [IU] via SUBCUTANEOUS
  Administered 2014-08-06: 11 [IU] via SUBCUTANEOUS
  Administered 2014-08-06 (×3): 4 [IU] via SUBCUTANEOUS
  Administered 2014-08-06: 3 [IU] via SUBCUTANEOUS
  Administered 2014-08-07 (×2): 7 [IU] via SUBCUTANEOUS
  Administered 2014-08-07: 4 [IU] via SUBCUTANEOUS
  Administered 2014-08-07: 7 [IU] via SUBCUTANEOUS
  Administered 2014-08-07 – 2014-08-08 (×2): 3 [IU] via SUBCUTANEOUS
  Administered 2014-08-08: 4 [IU] via SUBCUTANEOUS
  Administered 2014-08-08: 7 [IU] via SUBCUTANEOUS
  Administered 2014-08-08: 11 [IU] via SUBCUTANEOUS
  Administered 2014-08-08: 4 [IU] via SUBCUTANEOUS
  Administered 2014-08-08 – 2014-08-09 (×2): 7 [IU] via SUBCUTANEOUS
  Administered 2014-08-09: 4 [IU] via SUBCUTANEOUS
  Administered 2014-08-09: 7 [IU] via SUBCUTANEOUS
  Administered 2014-08-09 (×2): 4 [IU] via SUBCUTANEOUS
  Administered 2014-08-10: 11 [IU] via SUBCUTANEOUS
  Administered 2014-08-10: 4 [IU] via SUBCUTANEOUS

## 2014-07-28 MED ORDER — FREE WATER
120.0000 mL | Freq: Four times a day (QID) | Status: DC
  Administered 2014-07-28 – 2014-08-03 (×23): 120 mL

## 2014-07-28 MED ORDER — CLOPIDOGREL BISULFATE 75 MG PO TABS
75.0000 mg | ORAL_TABLET | Freq: Every day | ORAL | Status: DC
  Administered 2014-07-29 – 2014-08-10 (×13): 75 mg via ORAL
  Filled 2014-07-28 (×14): qty 1

## 2014-07-28 MED ORDER — ROSUVASTATIN CALCIUM 10 MG PO TABS
10.0000 mg | ORAL_TABLET | Freq: Every day | ORAL | Status: DC
  Administered 2014-07-28 – 2014-08-09 (×12): 10 mg via ORAL
  Filled 2014-07-28 (×14): qty 1

## 2014-07-28 MED ORDER — HYDROCODONE-ACETAMINOPHEN 7.5-325 MG/15ML PO SOLN
15.0000 mL | ORAL | Status: DC | PRN
  Administered 2014-07-28 – 2014-08-08 (×12): 15 mL via ORAL
  Filled 2014-07-28 (×12): qty 15

## 2014-07-28 MED ORDER — PANTOPRAZOLE SODIUM 40 MG PO TBEC
40.0000 mg | DELAYED_RELEASE_TABLET | Freq: Every day | ORAL | Status: DC
  Filled 2014-07-28 (×2): qty 1

## 2014-07-28 MED ORDER — INSULIN GLARGINE 100 UNIT/ML ~~LOC~~ SOLN
35.0000 [IU] | Freq: Every day | SUBCUTANEOUS | Status: DC
  Filled 2014-07-28: qty 0.35

## 2014-07-28 MED ORDER — BISACODYL 10 MG RE SUPP: 10.0000 mg | Freq: Every day | RECTAL | Status: DC

## 2014-07-28 MED ORDER — ONDANSETRON HCL 4 MG PO TABS
4.0000 mg | ORAL_TABLET | Freq: Four times a day (QID) | ORAL | Status: DC | PRN
  Administered 2014-07-29 – 2014-08-06 (×3): 4 mg via ORAL
  Filled 2014-07-28 (×3): qty 1

## 2014-07-28 MED ORDER — SORBITOL 70 % SOLN: 30.0000 mL | Freq: Every day | Status: DC | PRN

## 2014-07-28 NOTE — Progress Notes (Signed)
Meredith Staggers, MD Physician Signed Physical Medicine and Rehabilitation Consult Note 07/25/2014 9:23 AM  Related encounter: Admission (Current) from 07/20/2014 in [place] Collapse [facility]        Physical Medicine and Rehabilitation Consult Reason for Consult: Debilitation/gastric cancer with laparoscopic partial gastrectomy Referring Physician: Dr. Barry Dienes   HPI: Tammy Hughes is a 81 y.o. right handed female with recent diagnosis of new gastric cancer followed by gastroenterology Dr. Deatra Ina that was incidentally discovered upon endoscopy to follow-up for esophageal stricture. Biopsies positive for adenocarcinoma. Patient independent prior to admission living with her husband. Admitted 07/20/2014 underwent proximal gastrectomy and feeding jejunostomy per Dr. Barry Dienes. Hospital course pain management. On 07/24/2014 was 18 beats of NSVT. EKG showed normal sinus rhythm with borderline LVH. There was no chest pain or shortness of breath. Cardiology service is consulted. Troponin mildly elevated 0.60. Echocardiogram is pending. Placed on low-dose metoprolol. Tube feeds have been initiated. Bouts of confusion and restlessness at night. Physical therapy evaluation completed 07/22/2014 recommendations CIR consult.   Review of Systems  Gastrointestinal: Positive for constipation.   GERD  Musculoskeletal: Positive for myalgias and joint pain.  Psychiatric/Behavioral: Positive for depression.   Anxiety  All other systems reviewed and are negative.  Past Medical History  Diagnosis Date  . HYPERCHOLESTEROLEMIA 02/01/2008  . ANXIETY 03/08/2008  . PERIPHERAL NEUROPATHY 02/23/2007  . HYPERTENSION 03/08/2008  . GERD 02/23/2007  . DIVERTICULOSIS, COLON 03/08/2008  . OSTEOARTHRITIS 02/23/2007  . OSTEOPOROSIS 02/23/2007  . Urolithiasis   . Allergy     SEASONAL  . Cataract     REMOVED BILATERAL  . DIABETES  MELLITUS, TYPE I 02/23/2007  . Depression    Past Surgical History  Procedure Laterality Date  . Cholecystectomy  1995  . Esophagogastroduodenoscopy  06/08/2002  . Gastrectomy N/A 07/20/2014    Procedure: PROXIMAL GASTRECTOMY; Surgeon: Stark Klein, MD; Location: [place]; Service: General; Laterality: N/A;  . Laparoscopy N/A 07/20/2014    Procedure: LAPAROSCOPY DIAGNOSTIC; Surgeon: Stark Klein, MD; Location: [facility]; Service: General; Laterality: N/A;  . Gastrojejunostomy N/A 07/20/2014    Procedure: GASTROJEJUNOSTOMY; Surgeon: Stark Klein, MD; Location: [facility]; Service: General; Laterality: N/A;   Family History  Problem Relation Age of Onset  . Cancer Father     Prostate Cancer  . Cancer Sister     Breast Cancer  . Diabetes Sister   . Diabetes Sister    Social History:  reports that she has never smoked. She has never used smokeless tobacco. She reports that she does not drink alcohol or use illicit drugs. Allergies:  Allergies  Allergen Reactions  . Pirfenidone Diarrhea and Nausea And Vomiting  . Aspirin     rash   Medications Prior to Admission  Medication Sig Dispense Refill  . Calcium Carbonate-Vit D-Min (CALCIUM 600 + MINERALS) 600-200 MG-UNIT TABS Take 1 tablet by mouth daily.     . furosemide (LASIX) 20 MG tablet TAKE 1 TABLET BY MOUTH DAILY AS DIRECTED 90 tablet prn  . Insulin Aspart Prot & Aspart (NOVOLOG MIX 70/30 FLEXPEN) (70-30) 100 UNIT/ML Pen INJECT 70 UNITS SUBCUTANEOUSLY EVERY DAY WITH BREAKFAST AND 60 UNITSWITH THE EVENING MEAL 40 pen 1  . Insulin Pen Needle 31G X 8 MM MISC USE AS DIRECTED WITH INSULIN TWICE DAILY Dx: 250.01 200 each prn  . omeprazole (PRILOSEC) 20 MG capsule Take 1 capsule (20 mg total) by mouth 2 (two) times daily before a meal. 180 capsule 1  .  rosuvastatin (CRESTOR) 10 MG tablet Take 1 tablet (10 mg total) by mouth  daily. 90 tablet 3  . diphenoxylate-atropine (LOMOTIL) 2.5-0.025 MG per tablet Take 1-2 tablets by mouth every 6 (six) hours as needed for diarrhea or loose stools. (Patient not taking: Reported on 06/14/2014) 30 tablet 0  . HYDROcodone-acetaminophen (VICODIN) 5-500 MG per tablet Take 1 tablet by mouth every 4 (four) hours as needed. For pain (Patient not taking: Reported on 06/14/2014) 120 tablet 0  . triamcinolone cream (KENALOG) 0.1 % Apply 1 application topically 4 (four) times daily. As needed for itching (Patient not taking: Reported on 06/14/2014) 45 g 2    Home: Home Living Family/patient expects to be discharged to:: Private residence Living Arrangements: Spouse/significant other Available Help at Discharge: Family, Available 24 hours/day Type of Home: House Home Access: Level entry Home Layout: One level Home Equipment: None  Functional History: Prior Function Level of Independence: Independent Functional Status:  Mobility: Bed Mobility Overal bed mobility: Needs Assistance Bed Mobility: Rolling, Sidelying to Sit Rolling: Mod assist Sidelying to sit: Max assist General bed mobility comments: cues for sequence with pt trying to pull on therapist rather than rail and having difficulty achieving full sidelying with assist. max assist for side to sit to elevate trunk and bring legs off of bed Transfers Overall transfer level: Needs assistance Transfers: Sit to/from Stand, Stand Pivot Transfers Sit to Stand: Mod assist Stand pivot transfers: Min assist General transfer comment: cues for sequence with assist for elevation from surface with hand held assist to stand and pivot toward chair      ADL:    Cognition: Cognition Overall Cognitive Status: Impaired/Different from baseline Orientation Level: Oriented to person, Oriented to situation, Disoriented to place, Disoriented to time Cognition Arousal/Alertness: Awake/alert Behavior During Therapy: Flat  affect Overall Cognitive Status: Impaired/Different from baseline Area of Impairment: Attention Current Attention Level: Sustained General Comments: pt with flat affect, very limited interraction  Blood pressure 153/63, pulse 77, temperature 98.8 F (37.1 C), temperature source Oral, resp. rate 22, height 5\' 1"  (1.549 m), weight 74 kg (163 lb 2.3 oz), SpO2 98 %. Physical Exam  Vitals reviewed. HENT:  Head: Normocephalic.  Eyes: EOM are normal.  Neck: Normal range of motion. Neck supple. No thyromegaly present.  Cardiovascular:  Cardiac rate controlled  Respiratory: Effort normal and breath sounds normal. No respiratory distress.  GI: Soft. Bowel sounds are normal. She exhibits no distension. There is tenderness.  Neurological:  Patient is a bit lethargic but arousable. She was able to provide her name and age. Followed simple commands. UES: grossly 4/5 prox to distal.LE: 3/5 HF and KE and 4/5 ankles bilaterally. No sensory deficits.  Skin:  Surgical site is dressed     Lab Results Last 24 Hours    Results for orders placed or performed during the hospital encounter of 07/20/14 (from the past 24 hour(s))  Troponin I Status: Abnormal   Collection Time: 07/24/14 9:57 AM  Result Value Ref Range   Troponin I 0.51 (HH) <0.031 ng/mL  Magnesium Status: None   Collection Time: 07/24/14 9:57 AM  Result Value Ref Range   Magnesium 1.9 1.5 - 2.5 mg/dL  Glucose, capillary Status: Abnormal   Collection Time: 07/24/14 12:14 PM  Result Value Ref Range   Glucose-Capillary 252 (H) 70 - 99 mg/dL  Troponin I Status: Abnormal   Collection Time: 07/24/14 2:53 PM  Result Value Ref Range   Troponin I 0.57 (HH) <0.031 ng/mL  CK total and CKMB (  cardiac) Status: Abnormal   Collection Time: 07/24/14 2:53 PM  Result Value Ref Range   Total CK 920 (H) 7 - 177 U/L   CK, MB 7.0 (HH) 0.3 - 4.0 ng/mL   Relative Index  0.8 0.0 - 2.5  Glucose, capillary Status: Abnormal   Collection Time: 07/24/14 4:09 PM  Result Value Ref Range   Glucose-Capillary 197 (H) 70 - 99 mg/dL  Glucose, capillary Status: Abnormal   Collection Time: 07/24/14 7:49 PM  Result Value Ref Range   Glucose-Capillary 172 (H) 70 - 99 mg/dL   Comment 1 Notify RN   Troponin I Status: Abnormal   Collection Time: 07/24/14 8:07 PM  Result Value Ref Range   Troponin I 0.60 (HH) <0.031 ng/mL  Heparin level (unfractionated) Status: None   Collection Time: 07/24/14 8:07 PM  Result Value Ref Range   Heparin Unfractionated 0.54 0.30 - 0.70 IU/mL  CBC Status: Abnormal   Collection Time: 07/25/14 6:37 AM  Result Value Ref Range   WBC 11.2 (H) 4.0 - 10.5 K/uL   RBC 3.70 (L) 3.87 - 5.11 MIL/uL   Hemoglobin 10.5 (L) 12.0 - 15.0 g/dL   HCT 32.0 (L) 36.0 - 46.0 %   MCV 86.5 78.0 - 100.0 fL   MCH 28.4 26.0 - 34.0 pg   MCHC 32.8 30.0 - 36.0 g/dL   RDW 13.6 11.5 - 15.5 %   Platelets 270 150 - 400 K/uL  Basic metabolic panel Status: Abnormal   Collection Time: 07/25/14 6:37 AM  Result Value Ref Range   Sodium 134 (L) 135 - 145 mmol/L   Potassium 4.3 3.5 - 5.1 mmol/L   Chloride 104 96 - 112 mEq/L   CO2 24 19 - 32 mmol/L   Glucose, Bld 293 (H) 70 - 99 mg/dL   BUN 15 6 - 23 mg/dL   Creatinine, Ser 0.80 0.50 - 1.10 mg/dL   Calcium 9.2 8.4 - 10.5 mg/dL   GFR calc non Af Amer 68 (L) >90 mL/min   GFR calc Af Amer 79 (L) >90 mL/min   Anion gap 6 5 - 15  Heparin level (unfractionated) Status: None   Collection Time: 07/25/14 6:37 AM  Result Value Ref Range   Heparin Unfractionated 0.54 0.30 - 0.70 IU/mL  Glucose, capillary Status: Abnormal   Collection Time: 07/25/14 7:56 AM  Result Value Ref Range   Glucose-Capillary 216 (H) 70 - 99 mg/dL       Imaging Results (Last 48 hours)    No results found.    Assessment/Plan: Diagnosis: severe debility related to gastric cancer and subsequent gastrectomy 1. Does the need for close, 24 hr/day medical supervision in concert with the patient's rehab needs make it unreasonable for this patient to be served in a less intensive setting? Yes 2. Co-Morbidities requiring supervision/potential complications: dm with PN. NSVT, htn,  3. Due to bladder management, bowel management, safety, skin/wound care, disease management, medication administration, pain management and patient education, does the patient require 24 hr/day rehab nursing? Yes 4. Does the patient require coordinated care of a physician, rehab nurse, PT (1-2 hrs/day, 5 days/week) and OT (1-2 hrs/day, 5 days/week) to address physical and functional deficits in the context of the above medical diagnosis(es)? Yes Addressing deficits in the following areas: balance, endurance, locomotion, strength, transferring, bowel/bladder control, bathing, dressing, feeding, grooming, toileting and psychosocial support 5. Can the patient actively participate in an intensive therapy program of at least 3 hrs of therapy per day at least 5 days  per week? Yes 6. The potential for patient to make measurable gains while on inpatient rehab is excellent 7. Anticipated functional outcomes upon discharge from inpatient rehab are modified independent and supervision with PT, modified independent, supervision and min assist with OT, n/a with SLP. 8. Estimated rehab length of stay to reach the above functional goals is: 10-14 days 9. Does the patient have adequate social supports and living environment to accommodate these discharge functional goals? Yes 10. Anticipated D/C setting: Home 11. Anticipated post D/C treatments: [facility] therapy 12. Overall Rehab/Functional Prognosis: excellent  RECOMMENDATIONS: This patient's condition is appropriate for continued rehabilitative  care in the following setting: CIR Patient has agreed to participate in recommended program. Yes Note that insurance prior authorization may be required for reimbursement for recommended care.  Comment: Rehab Admissions Coordinator to follow up.  Thanks,  Meredith Staggers, MD, [facility]     07/25/2014       Revision History     Date/Time User Provider Type Action   07/25/2014 3:05 PM Meredith Staggers, MD Physician Sign   07/25/2014 9:53 AM Cathlyn Parsons, PA-C Physician Assistant Pend   View Details Report       Routing History     Date/Time From To Method   07/25/2014 3:05 PM Meredith Staggers, MD Meredith Staggers, MD In Basket   07/25/2014 3:05 PM Meredith Staggers, MD Renato Shin, MD In Basket

## 2014-07-28 NOTE — Progress Notes (Signed)
Pt arrived at 1500 with family at bedside. Reviewed rehab booklet, process and safety plan with verbal understanding. Call bell within reach, bed alarm on, and SRx3 in place. Family at bedside.

## 2014-07-28 NOTE — Progress Notes (Signed)
Cleatrice Burke, RN Rehab Admission Coordinator Signed Physical Medicine and Rehabilitation PMR Pre-admission 07/28/2014 11:25 AM  Related encounter: Admission (Current) from 07/20/2014 in [facility]   PMR Admission Coordinator Pre-Admission Assessment  Patient: Tammy Hughes is an 81 y.o., female MRN: [id number] DOB: May 14, 1933 Height: 5\' 1"  (154.9 cm) Weight: 74 kg (163 lb 2.3 oz)  Insurance Information HMO: PPO: PCP: IPA: 80/20: yes OTHER: no HMO PRIMARY: Medicare a and b Policy#: [id number] Subscriber: pt Benefits: Phone #: palmetto online Name: 07/21/14 Eff. Date: 03/15/2000 Deduct: $1288 Out of Pocket Max: none Life Max: none CIR: 100% SNF: 20 full days Outpatient: 80% Co-Pay: 20% Home Health: 100% Co-Pay: none DME: 80% Co-Pay: 20% Providers: pt choice  SECONDARY: State BCBS Policy#: [id number] Subscriber: pt  Medicaid Application Date: Case Manager:  Disability Application Date: Case Worker:   Emergency Contact Information Contact Information    Name Relation Home Work Mobile   [place] H Spouse [place] Daughter   [phone]   [place] Papa Daughter   [phone]     Current Medical History  Patient Admitting Diagnosis: severe debility related to gastric cancer and subsequent partial gastrectomy  History of Present Illness:Tammy Hughes is a 81 y.o. right handed female with recent diagnosis of new gastric cancer followed by gastroenterology Dr. Deatra Ina that was incidentally discovered upon endoscopy to follow-up for esophageal stricture. Biopsies positive for adenocarcinoma. Patient independent prior to admission  living with her husband.   Admitted 07/20/2014 underwent proximal gastrectomy and feeding jejunostomy per Dr. Barry Dienes. Hospital course pain management. On 07/24/2014 was 18 beats of NSVT. EKG showed normal sinus rhythm with borderline LVH. There was no chest pain or shortness of breath. Cardiology service is consulted. Troponin mildly elevated 0.60. Placed on low-dose metoprolol.  Tube feeds have been initiated. Bouts of confusion and restlessness at night. Pt to begin cyclic feeds and to remain on sips and ice chips.  Past Medical History  Past Medical History  Diagnosis Date  . HYPERCHOLESTEROLEMIA 02/01/2008  . ANXIETY 03/08/2008  . PERIPHERAL NEUROPATHY 02/23/2007  . HYPERTENSION 03/08/2008  . GERD 02/23/2007  . DIVERTICULOSIS, COLON 03/08/2008  . OSTEOARTHRITIS 02/23/2007  . OSTEOPOROSIS 02/23/2007  . Urolithiasis   . Allergy     SEASONAL  . Cataract     REMOVED BILATERAL  . DIABETES MELLITUS, TYPE I 02/23/2007  . Depression     Family History  family history includes Cancer in her father and sister; Diabetes in her sister and sister.  Prior Rehab/Hospitalizations: none  Current Medications   Current facility-administered medications:  . bisacodyl (DULCOLAX) suppository 10 mg, 10 mg, Rectal, Daily, Stark Klein, MD, 10 mg at 07/27/14 1011 . clopidogrel (PLAVIX) tablet 75 mg, 75 mg, Oral, Daily, Stark Klein, MD, 75 mg at 07/28/14 1013 . diphenoxylate-atropine (LOMOTIL) 2.5-0.025 MG per tablet 1-2 tablet, 1-2 tablet, Oral, Q6H PRN, Stark Klein, MD . feeding supplement (GLUCERNA 1.2 CAL) liquid 1,000 mL, 1,000 mL, Per Tube, Continuous, Stark Klein, MD, Last Rate: 50 mL/hr at 07/27/14 1326, 1,000 mL at 07/27/14 1326 . furosemide (LASIX) tablet 20 mg, 20 mg, Oral, Daily, Stark Klein, MD, 20 mg at 07/28/14 1013 . haloperidol lactate (HALDOL) injection 2-5 mg, 2-5 mg, Intravenous, Q6H PRN, Stark Klein, MD . hydrALAZINE  (APRESOLINE) injection 20 mg, 20 mg, Intravenous, Q4H PRN, Stark Klein, MD, 20 mg at 07/24/14 1956 . HYDROcodone-acetaminophen (HYCET) 7.5-325 mg/15 ml solution 15 mL, 15 mL,  Oral, Q4H PRN, Stark Klein, MD, 15 mL at 07/28/14 1014 . insulin aspart (novoLOG) injection 0-20 Units, 0-20 Units, Subcutaneous, 6 times per day, Stark Klein, MD, 4 Units at 07/28/14 0820 . insulin glargine (LANTUS) injection 35 Units, 35 Units, Subcutaneous, QHS, Stark Klein, MD . metoprolol tartrate (LOPRESSOR) tablet 25 mg, 25 mg, Oral, BID, Stark Klein, MD, 25 mg at 07/28/14 1013 . morphine 2 MG/ML injection 1-2 mg, 1-2 mg, Intravenous, Q2H PRN, Stark Klein, MD, 2 mg at 07/26/14 2058 . ondansetron (ZOFRAN) tablet 4 mg, 4 mg, Oral, Q6H PRN **OR** ondansetron (ZOFRAN) injection 4 mg, 4 mg, Intravenous, Q6H PRN, Stark Klein, MD, 4 mg at 07/26/14 2000 . pantoprazole (PROTONIX) EC tablet 40 mg, 40 mg, Oral, Daily, Jackolyn Confer, MD, 40 mg at 07/28/14 1017 . rosuvastatin (CRESTOR) tablet 10 mg, 10 mg, Oral, q1800, Stark Klein, MD, 10 mg at 07/27/14 1743 . traMADol (ULTRAM) tablet 50 mg, 50 mg, Oral, Q6H PRN, Stark Klein, MD, 50 mg at 07/27/14 1010  Patients Current Diet: sips of water and ice chips only  Precautions / Restrictions Precautions Precautions: Other (comment) Precaution Comments: jp drain, gastrostomy Restrictions Weight Bearing Restrictions: No   Prior Activity Level Community (5-7x/wk): pt active and totally independent pta without AD   Home Assistive Devices / [place] Devices/Equipment: CBG Meter Home Equipment: None  Prior Functional Level Prior Function Level of Independence: Independent Comments: Pt reports she was performing BADLs, driving and participating in community activities  Current Functional Level Cognition  Overall Cognitive Status: Within Functional Limits for tasks assessed Current Attention Level: Sustained Orientation Level: Oriented  X4 General Comments: pt with flat affect, very limited interraction   Extremity Assessment (includes Sensation/Coordination)          ADLs  Overall ADL's : Needs assistance/impaired Eating/Feeding: Set up, Sitting, Bed level Grooming: Wash/dry hands, Wash/dry face, Oral care, Set up, Sitting, Bed level Upper Body Bathing: Moderate assistance, Sitting Lower Body Bathing: Maximal assistance, Sit to/from stand Upper Body Dressing : Moderate assistance, Sitting Lower Body Dressing: Total assistance, Sit to/from stand Toilet Transfer: Minimal assistance, Stand-pivot, BSC, RW Toileting- Clothing Manipulation and Hygiene: Moderate assistance, Sit to/from stand Functional mobility during ADLs: Minimal assistance, Rolling walker General ADL Comments: Pt limited by nausea, abdominal pain, and dizziness upon standing. She did attempt to doff socks, but was unable to do so due to discomfort pain.     Mobility  Overal bed mobility: Needs Assistance Bed Mobility: Rolling, Sidelying to Sit Rolling: Min assist Sidelying to sit: Mod assist Sit to sidelying: Mod assist General bed mobility comments: verbal cues for technique. Pt with heavy reliance on rail. Requires assist to move LEs off bed and to lift shoulders from bed     Transfers  Overall transfer level: Needs assistance Equipment used: Rolling walker (2 wheeled) Transfers: Sit to/from Stand, Stand Pivot Transfers Sit to Stand: Min assist Stand pivot transfers: Min assist Lateral/Scoot Transfers: Mod assist General transfer comment: Min A to steady and increased time. Pt with complaint of dizziness     Ambulation / Gait / Stairs / Wheelchair Mobility  Ambulation/Gait Ambulation/Gait assistance: Min assist, +2 safety/equipment Ambulation Distance (Feet): 12 Feet Assistive device: Rolling walker (2 wheeled) Gait Pattern/deviations: Step-through pattern, Decreased stride length, Shuffle Gait velocity: very  slow General Gait Details: vc for upright posture and to keep RW closer to her (RW in room is too tall for pt--needs youth RW); assist to steady and maneuver RW    Posture / Balance  Dynamic Sitting Balance Sitting balance - Comments: hands in lap and feet unable to reach floor; no LOB    Special needs/care consideration Skin abd incision with staples. PEG with dressing Bowel mgmt: continent. 3 BMS in past 24 hrs Bladder mgmt: continent; foley removed 1/13 Diabetic mgmt yes   Previous Home Environment Living Arrangements: Spouse/significant other Lives With: Spouse Available Help at Discharge: Family, Available 24 hours/day Type of Home: House Home Layout: One level Home Access: Other (comment) (home totally wheelchair handicapped layout due to previous o) Bathroom Shower/Tub: Multimedia programmer: Handicapped height Bathroom Accessibility: Yes How Accessible: Accessible via wheelchair, Accessible via [place]: No Additional Comments: Pt from [facility] but PCP is Hospital doctor in [place]  Discharge Living Setting Plans for Discharge Living Setting: Patient's home, Lives with (comment) (spouse) Type of Home at Discharge: House Discharge Home Layout: One level, Other (Comment) (wheelchair handicapped home due to previous owner needs) Discharge Home Access: Level entry Discharge Bathroom Shower/Tub: Walk-in shower Discharge Bathroom Toilet: Handicapped height Discharge Bathroom Accessibility: Yes How Accessible: Accessible via wheelchair, Accessible via walker Does the patient have any problems obtaining your medications?: No  Social/Family/Support Systems Patient Roles: Spouse, Parent (has two daughters and two sons) Contact Information: Has spouse but daughters are rotating to be with pt 24/7 in hospital and are main contact Anticipated Caregiver: daughters and spouse Anticipated Caregiver's Contact Information: see above Ability/Limitations of  Caregiver: no limitations of daughters Caregiver Availability: 24/7 Discharge Plan Discussed with Primary Caregiver: Yes Is Caregiver In Agreement with Plan?: Yes Does Caregiver/Family have Issues with Lodging/Transportation while Pt is in Rehab?: No (daughters rotate staying with Mom inhospital 24/7)  Goals/Additional Needs Patient/Family Goal for Rehab: supervision to min assist with PT and OT Expected length of stay: [facility] 10 to 14 days Dietary Needs: pt with sips and chips of ice only Equipment Needs: New to PEG tube feeds. needs to begin cyclic feeds and needs extensive teaching with daughters  Special Service Needs: Pt to follow up with Dr. Benay Spice her oncologist at d/c Additional Information: [place], daughter has a pharmacy background Pt/Family Agrees to Admission and willing to participate: Yes Program Orientation Provided & Reviewed with Pt/Caregiver Including Roles & Responsibilities: Yes  Decrease burden of Care through IP rehab admission: n/a  Possible need for SNF placement upon discharge:no  Patient Condition: This patient's medical and functional status has changed since the consult dated: 07/25/14 in which the Rehabilitation Physician determined and documented that the patient's condition is appropriate for intensive rehabilitative care in an inpatient rehabilitation facility. See "History of Present Illness" (above) for medical update. Functional changes [id number] min to mod assist. Patient's medical and functional status update has been discussed with the Rehabilitation physician and patient remains appropriate for inpatient rehabilitation. Will admit to inpatient rehab today.  Preadmission Screen Completed By: Cleatrice Burke, 07/28/2014 11:33 AM ______________________________________________________________________  Discussed status with Dr. Letta Pate on 07/28/14 at 1133 and received telephone approval for admission today.  Admission Coordinator: Cleatrice Burke, time 3295 Date 07/28/2014.          Cosigned by: Charlett Blake, MD at 07/28/2014 11:44 AM  Revision History     Date/Time User Provider Type Action   07/28/2014 11:44 AM Charlett Blake, MD Physician Cosign   07/28/2014 11:34 AM Cleatrice Burke, RN Rehab Admission Coordinator Sign   07/28/2014 11:34 AM Cleatrice Burke, RN Rehab Admission Coordinator Sign   View Details Report

## 2014-07-28 NOTE — Clinical Social Work Note (Signed)
CSW received referral for SNF.  Case discussed with case manager, plan is to go to inpatient rehab.  CSW to sign off please re-consult if social work needs arise.  Jones Broom. [place], MSW, [place]

## 2014-07-28 NOTE — Discharge Planning (Signed)
Report was called to RN on IP Rehab. Transfer pt to 4W10 per w/c with all personal belongings at 1445, accompanied by husband and NT TF bottle and meds sent with pt.

## 2014-07-28 NOTE — Progress Notes (Signed)
INITIAL NUTRITION ASSESSMENT  DOCUMENTATION CODES Per approved criteria  -Not Applicable   INTERVENTION: Change Glucerna 1.2 from continuous @ 50 ml/hr to 60 ml/hr x 20 hours. (Adjusting TF rate to compensate for TF being off 4 hours per day for therapies)  120 ml H2O every 6 hours  Provides: 1440 kcal, 72 grams of protein, and 973 ml H2O.   NUTRITION DIAGNOSIS: Inadequate oral intake related to inability to eat as evidenced by NPO status.   Goal: Intake to meet >90% of estimated nutrition needs.  Monitor:  TF tolerance/adequacy, weight trend, labs.  Reason for Assessment: New TF  81 y.o. female  Admitting Dx: Gastric cancer  ASSESSMENT: Patient admitted on 1/6 S/P laparoscopy with proximal gastrectomy and feeding jejunostomy. Pt transferred to in-patient rehab today.   Per pt she had no recent weight changes and had a good appetite PTA. Per pt she has been tolerating her TF well. Has had a BM today.   No signs of fat or muscle depletion noted on exam.   CBG: 197-245  Height: Ht Readings from Last 1 Encounters:  07/21/14 5\' 1"  (1.549 m)    Weight: Wt Readings from Last 1 Encounters:  07/21/14 163 lb 2.3 oz (74 kg)    Ideal Body Weight: 47.7 kg   % Ideal Body Weight: 155%  Wt Readings from Last 10 Encounters:  07/21/14 163 lb 2.3 oz (74 kg)  06/14/14 163 lb 8 oz (74.163 kg)  05/31/14 166 lb (75.297 kg)  04/14/14 166 lb (75.297 kg)  04/06/14 167 lb 6.4 oz (75.932 kg)  03/15/14 165 lb (74.844 kg)  11/12/13 167 lb (75.751 kg)  10/26/13 167 lb (75.751 kg)  10/13/13 168 lb (76.204 kg)  08/23/13 170 lb 9.6 oz (77.384 kg)    Usual Body Weight: 166 lb  % Usual Body Weight: 98%  BMI:  30.4 - class I obesity   Estimated Nutritional Needs: Kcal: 1300-1500 Protein: 65-80 gm Fluid: 1.5 L  Skin: abdominal incision   Diet Order:    EDUCATION NEEDS: -No education needs identified at this time  No intake or output data in the 24 hours ending 07/28/14  1456  Last BM: 1/14   Labs:   Recent Labs Lab 07/24/14 0530 07/24/14 0957 07/25/14 0637 07/27/14 0358  NA 141  --  134* 136  K 4.2  --  4.3 3.7  CL 110  --  104 102  CO2 26  --  24 29  BUN 12  --  15 15  CREATININE 0.76  --  0.80 0.78  CALCIUM 9.3  --  9.2 9.5  MG  --  1.9  --   --   GLUCOSE 200*  --  293* 249*    CBG (last 3)   Recent Labs  07/28/14 0407 07/28/14 0741 07/28/14 1206  GLUCAP 210* 197* 245*    Scheduled Meds:  Continuous Infusions:  Past Medical History  Diagnosis Date  . HYPERCHOLESTEROLEMIA 02/01/2008  . ANXIETY 03/08/2008  . PERIPHERAL NEUROPATHY 02/23/2007  . HYPERTENSION 03/08/2008  . GERD 02/23/2007  . DIVERTICULOSIS, COLON 03/08/2008  . OSTEOARTHRITIS 02/23/2007  . OSTEOPOROSIS 02/23/2007  . Urolithiasis   . Allergy     SEASONAL  . Cataract     REMOVED BILATERAL  . DIABETES MELLITUS, TYPE I 02/23/2007  . Depression     Past Surgical History  Procedure Laterality Date  . Cholecystectomy  1995  . Esophagogastroduodenoscopy  06/08/2002  . Gastrectomy N/A 07/20/2014    Procedure: PROXIMAL  GASTRECTOMY;  Surgeon: Stark Klein, MD;  Location: [facility];  Service: General;  Laterality: N/A;  . Laparoscopy N/A 07/20/2014    Procedure: LAPAROSCOPY DIAGNOSTIC;  Surgeon: Stark Klein, MD;  Location: [facility];  Service: General;  Laterality: N/A;  . Gastrojejunostomy N/A 07/20/2014    Procedure: GASTROJEJUNOSTOMY;  Surgeon: Stark Klein, MD;  Location: [facility];  Service: General;  Laterality: N/A;    Maylon Peppers RD, [place], [place] Pager [phone] After Hours Pager

## 2014-07-28 NOTE — H&P (Signed)
Physical Medicine and Rehabilitation Admission H&P   No chief complaint on file. :  Chief complaint: Weakness   HPI: Tammy Hughes is a 81 y.o. right handed female with recent diagnosis of new gastric cancer followed by gastroenterology Dr. Deatra Ina that was incidentally discovered upon endoscopy to follow-up for esophageal stricture. Biopsies positive for adenocarcinoma. Patient independent prior to admission living with her husband. Admitted 07/20/2014 underwent proximal gastrectomy and feeding jejunostomy per Dr. Barry Dienes. Hospital course pain management. On 07/24/2014 with 18 beats of NSVT. EKG showed normal sinus rhythm with borderline LVH. There was no chest pain or shortness of breath. Cardiology service is consulted. Troponin mildly elevated 0.60. Echocardiogram with ejection fraction of 60% without PFO motion abnormalities.. Placed on low-dose metoprolol. Tube feeds have been initiated and cycling of tube feeds and she remains nothing by mouth other than ice chips. Bouts of confusion and restlessness at night. Physical therapy evaluation completed 07/22/2014 recommendations CIR consult. Patient was admitted for comprehensive rehabilitation program  Taking sips of water today no vomiting. Patient reports bowel movement this morning. ROS Review of Systems  Gastrointestinal: Positive for constipation.   GERD  Musculoskeletal: Positive for myalgias and joint pain.  Psychiatric/Behavioral: Positive for depression.   Anxiety  All other systems reviewed and are negative   Past Medical History  Diagnosis Date  . HYPERCHOLESTEROLEMIA 02/01/2008  . ANXIETY 03/08/2008  . PERIPHERAL NEUROPATHY 02/23/2007  . HYPERTENSION 03/08/2008  . GERD 02/23/2007  . DIVERTICULOSIS, COLON 03/08/2008  . OSTEOARTHRITIS 02/23/2007  . OSTEOPOROSIS 02/23/2007  . Urolithiasis   . Allergy     SEASONAL  . Cataract     REMOVED BILATERAL  . DIABETES  MELLITUS, TYPE I 02/23/2007  . Depression    Past Surgical History  Procedure Laterality Date  . Cholecystectomy  1995  . Esophagogastroduodenoscopy  06/08/2002  . Gastrectomy N/A 07/20/2014    Procedure: PROXIMAL GASTRECTOMY; Surgeon: Stark Klein, MD; Location: [place]; Service: General; Laterality: N/A;  . Laparoscopy N/A 07/20/2014    Procedure: LAPAROSCOPY DIAGNOSTIC; Surgeon: Stark Klein, MD; Location: [place]; Service: General; Laterality: N/A;  . Gastrojejunostomy N/A 07/20/2014    Procedure: GASTROJEJUNOSTOMY; Surgeon: Stark Klein, MD; Location: [facility]; Service: General; Laterality: N/A;   Family History  Problem Relation Age of Onset  . Cancer Father     Prostate Cancer  . Cancer Sister     Breast Cancer  . Diabetes Sister   . Diabetes Sister    Social History:  reports that she has never smoked. She has never used smokeless tobacco. She reports that she does not drink alcohol or use illicit drugs. Allergies:  Allergies  Allergen Reactions  . Pirfenidone Diarrhea and Nausea And Vomiting  . Aspirin     rash   Medications Prior to Admission  Medication Sig Dispense Refill  . Calcium Carbonate-Vit D-Min (CALCIUM 600 + MINERALS) 600-200 MG-UNIT TABS Take 1 tablet by mouth daily.     . furosemide (LASIX) 20 MG tablet TAKE 1 TABLET BY MOUTH DAILY AS DIRECTED 90 tablet prn  . Insulin Aspart Prot & Aspart (NOVOLOG MIX 70/30 FLEXPEN) (70-30) 100 UNIT/ML Pen INJECT 70 UNITS SUBCUTANEOUSLY EVERY DAY WITH BREAKFAST AND 60 UNITSWITH THE EVENING MEAL 40 pen 1  . Insulin Pen Needle 31G X 8 MM MISC USE AS DIRECTED WITH INSULIN TWICE DAILY Dx: 250.01 200 each prn  . omeprazole (PRILOSEC) 20 MG capsule Take 1 capsule (20 mg total) by mouth 2 (two) times daily before a meal. 180 capsule 1  .  rosuvastatin (CRESTOR) 10 MG tablet Take 1 tablet (10 mg total) by mouth  daily. 90 tablet 3  . diphenoxylate-atropine (LOMOTIL) 2.5-0.025 MG per tablet Take 1-2 tablets by mouth every 6 (six) hours as needed for diarrhea or loose stools. (Patient not taking: Reported on 06/14/2014) 30 tablet 0  . HYDROcodone-acetaminophen (VICODIN) 5-500 MG per tablet Take 1 tablet by mouth every 4 (four) hours as needed. For pain (Patient not taking: Reported on 06/14/2014) 120 tablet 0  . triamcinolone cream (KENALOG) 0.1 % Apply 1 application topically 4 (four) times daily. As needed for itching (Patient not taking: Reported on 06/14/2014) 45 g 2    Home: Home Living Family/patient expects to be discharged to:: Private residence Living Arrangements: Spouse/significant other Available Help at Discharge: Family, Available 24 hours/day Type of Home: House Home Access: Level entry Home Layout: One level Home Equipment: None  Functional History: Prior Function Level of Independence: Independent  Functional Status:  Mobility: Bed Mobility Overal bed mobility: Needs Assistance Bed Mobility: Rolling, Sidelying to Sit, Sit to Sidelying Rolling: Min assist Sidelying to sit: Mod assist Sit to sidelying: Mod assist General bed mobility comments: vc for technique to minimize abd pain; HOB flat to simulate home environment; assist due to weakness and pain Transfers Overall transfer level: Needs assistance Equipment used: None Transfers: Lateral/Scoot Transfers Sit to Stand: Mod assist Stand pivot transfers: Min assist Lateral/Scoot Transfers: Mod assist General transfer comment: scoot x 3 along EOB towards HOB prior to return to sidelying      ADL:    Cognition: Cognition Overall Cognitive Status: Within Functional Limits for tasks assessed Orientation Level: Oriented to person, Oriented to place Cognition Arousal/Alertness: Awake/alert Behavior During Therapy: Flat affect Overall Cognitive Status: Within Functional Limits for tasks assessed Area  of Impairment: Attention Current Attention Level: Sustained General Comments: pt with flat affect, very limited interraction  Physical Exam: Blood pressure 142/58, pulse 69, temperature 99.2 F (37.3 C), temperature source Oral, resp. rate 18, height _0  (1.549 m), weight 74 kg (163 lb 2.3 oz), SpO2 95 %. Physical Exam Vitals reviewed. HENT:  Head: Normocephalic.  Eyes: EOM are normal.  Neck: Normal range of motion. Neck supple. No thyromegaly present.  Cardiovascular:  Cardiac rate controlled  Respiratory: Effort normal and breath sounds normal. No respiratory distress.  GI: Soft. Bowel sounds are normal. She exhibits no distension. There is tenderness.  Neurological:  . She was able to provide her name and age. Followed simple commands. UES: Bilateral deltoid, biceps, triceps, grip .LE: Bilateral 3/5 HF and4 minus/5 KE and 4/5 ankles bilaterally. Light touch sensation intact bilateral lower extremities..  Skin:  Surgical site is dressed     Lab Results Last 48 Hours    Results for orders placed or performed during the hospital encounter of 07/20/14 (from the past 48 hour(s))  Glucose, capillary Status: Abnormal   Collection Time: 07/24/14 7:53 AM  Result Value Ref Range   Glucose-Capillary 186 (H) 70 - 99 mg/dL  Troponin I Status: Abnormal   Collection Time: 07/24/14 9:57 AM  Result Value Ref Range   Troponin I 0.51 (HH) <0.031 ng/mL    Comment:   POSSIBLE MYOCARDIAL ISCHEMIA. SERIAL TESTING RECOMMENDED. Please note change in reference range. REPEATED TO VERIFY CRITICAL RESULT CALLED TO, READ BACK BY AND VERIFIED WITH: HUCKEBEECRN [id number] MCCAULEG   Magnesium Status: None   Collection Time: 07/24/14 9:57 AM  Result Value Ref Range   Magnesium 1.9 1.5 - 2.5 mg/dL  Glucose, capillary Status:  Abnormal   Collection Time: 07/24/14 12:14 PM  Result Value Ref Range   Glucose-Capillary 252  (H) 70 - 99 mg/dL  Troponin I Status: Abnormal   Collection Time: 07/24/14 2:53 PM  Result Value Ref Range   Troponin I 0.57 (HH) <0.031 ng/mL    Comment:   POSSIBLE MYOCARDIAL ISCHEMIA. SERIAL TESTING RECOMMENDED. Please note change in reference range. REPEATED TO VERIFY CRITICAL RESULT CALLED TO, READ BACK BY AND VERIFIED WITH: C.HUCKABEE,RN 07/24/14 _0  BY V.WILKINS   CK total and CKMB (cardiac) Status: Abnormal   Collection Time: 07/24/14 2:53 PM  Result Value Ref Range   Total CK 920 (H) 7 - 177 U/L   CK, MB 7.0 (HH) 0.3 - 4.0 ng/mL    Comment: REPEATED TO VERIFY CRITICAL RESULT CALLED TO, READ BACK BY AND VERIFIED WITH: C.HUCKABEE,RN 07/24/14 _1  BY V.WILKINS    Relative Index 0.8 0.0 - 2.5  Glucose, capillary Status: Abnormal   Collection Time: 07/24/14 4:09 PM  Result Value Ref Range   Glucose-Capillary 197 (H) 70 - 99 mg/dL  Glucose, capillary Status: Abnormal   Collection Time: 07/24/14 7:49 PM  Result Value Ref Range   Glucose-Capillary 172 (H) 70 - 99 mg/dL   Comment 1 Notify RN   Troponin I Status: Abnormal   Collection Time: 07/24/14 8:07 PM  Result Value Ref Range   Troponin I 0.60 (HH) <0.031 ng/mL    Comment:   POSSIBLE MYOCARDIAL ISCHEMIA. SERIAL TESTING RECOMMENDED. Please note change in reference range. REPEATED TO VERIFY CRITICAL VALUE NOTED. VALUE IS CONSISTENT WITH PREVIOUSLY REPORTED AND CALLED VALUE.   Heparin level (unfractionated) Status: None   Collection Time: 07/24/14 8:07 PM  Result Value Ref Range   Heparin Unfractionated 0.54 0.30 - 0.70 IU/mL    Comment:   IF HEPARIN RESULTS ARE BELOW EXPECTED VALUES, AND PATIENT DOSAGE HAS BEEN CONFIRMED, SUGGEST FOLLOW UP TESTING OF ANTITHROMBIN III LEVELS.   CBC Status: Abnormal   Collection Time: 07/25/14 6:37 AM  Result Value Ref Range    WBC 11.2 (H) 4.0 - 10.5 K/uL   RBC 3.70 (L) 3.87 - 5.11 MIL/uL   Hemoglobin 10.5 (L) 12.0 - 15.0 g/dL   HCT 32.0 (L) 36.0 - 46.0 %   MCV 86.5 78.0 - 100.0 fL   MCH 28.4 26.0 - 34.0 pg   MCHC 32.8 30.0 - 36.0 g/dL   RDW 13.6 11.5 - 15.5 %   Platelets 270 150 - 400 K/uL  Basic metabolic panel Status: Abnormal   Collection Time: 07/25/14 6:37 AM  Result Value Ref Range   Sodium 134 (L) 135 - 145 mmol/L    Comment: Please note change in reference range.   Potassium 4.3 3.5 - 5.1 mmol/L    Comment: Please note change in reference range.   Chloride 104 96 - 112 mEq/L   CO2 24 19 - 32 mmol/L   Glucose, Bld 293 (H) 70 - 99 mg/dL   BUN 15 6 - 23 mg/dL   Creatinine, Ser 0.80 0.50 - 1.10 mg/dL   Calcium 9.2 8.4 - 10.5 mg/dL   GFR calc non Af Amer 68 (L) >90 mL/min   GFR calc Af Amer 79 (L) >90 mL/min    Comment: (NOTE) The eGFR has been calculated using the CKD EPI equation. This calculation has not been validated in all clinical situations. eGFR's persistently <90 mL/min signify possible Chronic Kidney Disease.    Anion gap 6 5 - 15  Heparin level (unfractionated) Status: None  Collection Time: 07/25/14 6:37 AM  Result Value Ref Range   Heparin Unfractionated 0.54 0.30 - 0.70 IU/mL    Comment:   IF HEPARIN RESULTS ARE BELOW EXPECTED VALUES, AND PATIENT DOSAGE HAS BEEN CONFIRMED, SUGGEST FOLLOW UP TESTING OF ANTITHROMBIN III LEVELS.   Glucose, capillary Status: Abnormal   Collection Time: 07/25/14 7:56 AM  Result Value Ref Range   Glucose-Capillary 216 (H) 70 - 99 mg/dL  Glucose, capillary Status: Abnormal   Collection Time: 07/25/14 11:51 AM  Result Value Ref Range   Glucose-Capillary 259 (H) 70 - 99 mg/dL  Glucose, capillary Status: Abnormal   Collection Time: 07/25/14 4:02 PM  Result Value Ref Range    Glucose-Capillary 191 (H) 70 - 99 mg/dL   Comment 1 Notify RN   Glucose, capillary Status: Abnormal   Collection Time: 07/25/14 7:41 PM  Result Value Ref Range   Glucose-Capillary 165 (H) 70 - 99 mg/dL   Comment 1 Notify RN   Glucose, capillary Status: Abnormal   Collection Time: 07/25/14 11:33 PM  Result Value Ref Range   Glucose-Capillary 166 (H) 70 - 99 mg/dL   Comment 1 Notify RN   Glucose, capillary Status: Abnormal   Collection Time: 07/26/14 3:37 AM  Result Value Ref Range   Glucose-Capillary 178 (H) 70 - 99 mg/dL   Comment 1 Notify RN       Imaging Results (Last 48 hours)    No results found.       Medical Problem List and Plan: 1. Functional deficits secondary to severe debilitation related to gastric cancer and subsequent gastrectomy with jejunostomy tube 07/20/2014 2. DVT Prophylaxis/Anticoagulation: SCDs. Monitor for any signs of DVT 3. Pain Management: Hycet as needed. Monitor with increased mobility 4. Mood/restlessness: Check sleep chart. Bed alarm for safety 5. Neuropsych: This patient is capable of making decisions on her own behalf. 6. Skin/Wound Care: Routine skin checks of surgical sites as well as routine turning to maintain skin 7. Fluids/Electrolytes/Nutrition: Strict I and O with follow-up chemistries. Dietary consult for tube feedings 8. Asymptomatic VT. follow-up cardiology services. Lopressor 25 mg twice a day, Lasix 20 mg daily. Cardiac rate controlled 9. Diabetes mellitus with peripheral neuropathy. Latest hemoglobin A1c 8.1. Lantus insulin 35units daily at bedtime. Check blood sugars before meals and at bedtime. Monitor closely while on tube feeds 10. Hyperlipidemia. Crestor 11. Decreased nutritional storage. Continue nutritional support via jejunostomy tube. Dietary follow-up for cycling of tube feeds. Patient is to remain nothing by mouth other than ice chips   Post Admission  Physician Evaluation: 1. Functional deficits secondary to Severe debilitation secondary to gastric carcinoma status post gastrectomy and jejunostomy 07/20/2014. 2. Patient is admitted to receive collaborative, interdisciplinary care between the physiatrist, rehab nursing staff, and therapy team. 3. Patient's level of medical complexity and substantial therapy needs in context of that medical necessity cannot be provided at a lesser intensity of care such as a SNF. 4. Patient has experienced substantial functional loss from his/her baseline which was documented above under the "Functional History" and "Functional Status" headings. Judging by the patient's diagnosis, physical exam, and functional history, the patient has potential for functional progress which will result in measurable gains while on inpatient rehab. These gains will be of substantial and practical use upon discharge in facilitating mobility and self-care at the household level. 5. Physiatrist will provide 24 hour management of medical needs as well as oversight of the therapy plan/treatment and provide guidance as appropriate regarding the interaction of the two. 6.  24 hour rehab nursing will assist with bladder management, bowel management, safety, skin/wound care, disease management, medication administration, pain management and patient education and help integrate therapy concepts, techniques,education, etc. 7. PT will assess and treat for/with: pre gait, gait training, endurance , safety, equipment, neuromuscular re education. Goals are: sup/minA. 8. OT will assess and treat for/with: ADLs, Cognitive perceptual skills, Neuromuscular re education, safety, endurance, equipment. Goals are: sup/minA. Therapy may proceed with showering this patient. 9. SLP will assess and treat for/with: Swallowing, cognition. Goals are: safe po intake, mod I med management. 10. Case Management and Social Worker will assess and treat for  psychological issues and discharge planning. 11. Team conference will be held weekly to assess progress toward goals and to determine barriers to discharge. 12. Patient will receive at least 3 hours of therapy per day at least 5 days per week. 13. ELOS: 12-16d   14. Prognosis: good     Charlett Blake M.D. [facility] FAAPM&R (Sports Med, Neuromuscular Med) Diplomate Am Board of Electrodiagnostic Med

## 2014-07-28 NOTE — PMR Pre-admission (Signed)
PMR Admission Coordinator Pre-Admission Assessment  Patient: Tammy Hughes is an 81 y.o., female MRN: [id number] DOB: 1933/01/01 Height: 5\' 1"  (154.9 cm) Weight: 74 kg (163 lb 2.3 oz)              Insurance Information HMO:     PPO:      PCP:      IPA:      80/20: yes     OTHER: no HMO PRIMARY: Medicare a and b      Policy#: [id number]      Subscriber: pt Benefits:  Phone #: [email or web address] online     Name: 07/21/14 Eff. Date: 03/15/2000     Deduct: $1288      Out of Pocket Max: none      Life Max: none CIR: 100%      SNF: 20 full days Outpatient: 80%     Co-Pay: 20% Home Health: 100%      Co-Pay: none DME: 80%     Co-Pay: 20% Providers: pt choice  SECONDARY: State BCBS      Policy#: [id number]      Subscriber: pt  Medicaid Application Date:       Case Manager:  Disability Application Date:       Case Worker:   Emergency Contact Information Contact Information    Name Relation Home Work Mobile   Tammy Hughes Spouse [place] Daughter   [phone]   Wendall Papa Daughter   [phone]     Current Medical History  Patient Admitting Diagnosis: severe debility related to gastric cancer and subsequent partial gastrectomy  History of Present Illness:Tammy Hughes is a 81 y.o. right handed female with recent diagnosis of new gastric cancer followed by gastroenterology Dr. Deatra Ina that was incidentally discovered upon endoscopy to follow-up for esophageal stricture. Biopsies positive for adenocarcinoma. Patient independent prior to admission living with her husband.   Admitted 07/20/2014 underwent proximal gastrectomy and feeding jejunostomy per Dr. Barry Dienes. Hospital course pain management. On 07/24/2014 was 18 beats of NSVT. EKG showed normal sinus rhythm with borderline LVH. There was no chest pain or shortness of breath. Cardiology service is consulted. Troponin mildly elevated 0.60.  Placed on low-dose metoprolol.  Tube feeds have been initiated. Bouts of  confusion and restlessness at night. Pt to begin cyclic feeds and to remain on sips and ice chips.  Past Medical History  Past Medical History  Diagnosis Date  . HYPERCHOLESTEROLEMIA 02/01/2008  . ANXIETY 03/08/2008  . PERIPHERAL NEUROPATHY 02/23/2007  . HYPERTENSION 03/08/2008  . GERD 02/23/2007  . DIVERTICULOSIS, COLON 03/08/2008  . OSTEOARTHRITIS 02/23/2007  . OSTEOPOROSIS 02/23/2007  . Urolithiasis   . Allergy     SEASONAL  . Cataract     REMOVED BILATERAL  . DIABETES MELLITUS, TYPE I 02/23/2007  . Depression     Family History  family history includes Cancer in her father and sister; Diabetes in her sister and sister.  Prior Rehab/Hospitalizations: none  Current Medications   Current facility-administered medications:  .  bisacodyl (DULCOLAX) suppository 10 mg, 10 mg, Rectal, Daily, Stark Klein, MD, 10 mg at 07/27/14 1011 .  clopidogrel (PLAVIX) tablet 75 mg, 75 mg, Oral, Daily, Stark Klein, MD, 75 mg at 07/28/14 1013 .  diphenoxylate-atropine (LOMOTIL) 2.5-0.025 MG per tablet 1-2 tablet, 1-2 tablet, Oral, Q6H PRN, Stark Klein, MD .  feeding supplement (GLUCERNA 1.2 CAL) liquid 1,000 mL, 1,000 mL, Per Tube, Continuous, Stark Klein, MD, Last Rate: 50 mL/hr at 07/27/14 1326, 1,000  mL at 07/27/14 1326 .  furosemide (LASIX) tablet 20 mg, 20 mg, Oral, Daily, Stark Klein, MD, 20 mg at 07/28/14 1013 .  haloperidol lactate (HALDOL) injection 2-5 mg, 2-5 mg, Intravenous, Q6H PRN, Stark Klein, MD .  hydrALAZINE (APRESOLINE) injection 20 mg, 20 mg, Intravenous, Q4H PRN, Stark Klein, MD, 20 mg at 07/24/14 1956 .  HYDROcodone-acetaminophen (HYCET) 7.5-325 mg/15 ml solution 15 mL, 15 mL, Oral, Q4H PRN, Stark Klein, MD, 15 mL at 07/28/14 1014 .  insulin aspart (novoLOG) injection 0-20 Units, 0-20 Units, Subcutaneous, 6 times per day, Stark Klein, MD, 4 Units at 07/28/14 0820 .  insulin glargine (LANTUS) injection 35 Units, 35 Units, Subcutaneous, QHS, Stark Klein, MD .  metoprolol  tartrate (LOPRESSOR) tablet 25 mg, 25 mg, Oral, BID, Stark Klein, MD, 25 mg at 07/28/14 1013 .  morphine 2 MG/ML injection 1-2 mg, 1-2 mg, Intravenous, Q2H PRN, Stark Klein, MD, 2 mg at 07/26/14 2058 .  ondansetron (ZOFRAN) tablet 4 mg, 4 mg, Oral, Q6H PRN **OR** ondansetron (ZOFRAN) injection 4 mg, 4 mg, Intravenous, Q6H PRN, Stark Klein, MD, 4 mg at 07/26/14 2000 .  pantoprazole (PROTONIX) EC tablet 40 mg, 40 mg, Oral, Daily, Jackolyn Confer, MD, 40 mg at 07/28/14 1017 .  rosuvastatin (CRESTOR) tablet 10 mg, 10 mg, Oral, q1800, Stark Klein, MD, 10 mg at 07/27/14 1743 .  traMADol (ULTRAM) tablet 50 mg, 50 mg, Oral, Q6H PRN, Stark Klein, MD, 50 mg at 07/27/14 1010  Patients Current Diet: sips of water and ice chips only  Precautions / Restrictions Precautions Precautions: Other (comment) Precaution Comments: jp drain, gastrostomy Restrictions Weight Bearing Restrictions: No   Prior Activity Level Community (5-7x/wk): pt active and totally independent pta without AD   Home Assistive Devices / [place] Devices/Equipment: CBG Meter Home Equipment: None  Prior Functional Level Prior Function Level of Independence: Independent Comments: Pt reports she was performing BADLs, driving and participating in community activities  Current Functional Level Cognition  Overall Cognitive Status: Within Functional Limits for tasks assessed Current Attention Level: Sustained Orientation Level: Oriented X4 General Comments: pt with flat affect, very limited interraction    Extremity Assessment (includes Sensation/Coordination)          ADLs  Overall ADL's : Needs assistance/impaired Eating/Feeding: Set up, Sitting, Bed level Grooming: Wash/dry hands, Wash/dry face, Oral care, Set up, Sitting, Bed level Upper Body Bathing: Moderate assistance, Sitting Lower Body Bathing: Maximal assistance, Sit to/from stand Upper Body Dressing : Moderate assistance, Sitting Lower Body  Dressing: Total assistance, Sit to/from stand Toilet Transfer: Minimal assistance, Stand-pivot, BSC, RW Toileting- Clothing Manipulation and Hygiene: Moderate assistance, Sit to/from stand Functional mobility during ADLs: Minimal assistance, Rolling walker General ADL Comments: Pt limited by nausea, abdominal pain, and dizziness upon standing.  She did attempt to doff socks, but was unable to do so due to discomfort pain.       Mobility  Overal bed mobility: Needs Assistance Bed Mobility: Rolling, Sidelying to Sit Rolling: Min assist Sidelying to sit: Mod assist Sit to sidelying: Mod assist General bed mobility comments: verbal cues for technique.  Pt with heavy reliance on rail.  Requires assist to move LEs off bed and to lift shoulders from bed     Transfers  Overall transfer level: Needs assistance Equipment used: Rolling walker (2 wheeled) Transfers: Sit to/from Stand, Stand Pivot Transfers Sit to Stand: Min assist Stand pivot transfers: Min assist  Lateral/Scoot Transfers: Mod assist General transfer comment: Min A to steady and  increased time.  Pt with complaint of dizziness     Ambulation / Gait / Stairs / Wheelchair Mobility  Ambulation/Gait Ambulation/Gait assistance: Min assist, +2 safety/equipment Ambulation Distance (Feet): 12 Feet Assistive device: Rolling walker (2 wheeled) Gait Pattern/deviations: Step-through pattern, Decreased stride length, Shuffle Gait velocity: very slow General Gait Details: vc for upright posture and to keep RW closer to her (RW in room is too tall for pt--needs youth RW); assist to steady and maneuver RW    Posture / Balance Dynamic Sitting Balance Sitting balance - Comments: hands in lap and feet unable to reach floor; no LOB    Special needs/care consideration Skin abd incision with staples. PEG with dressing Bowel mgmt: continent. 3 BMS in past 24 hrs Bladder mgmt: continent; foley removed 1/13 Diabetic mgmt yes   Previous Home  Environment Living Arrangements: Spouse/significant other  Lives With: Spouse Available Help at Discharge: Family, Available 24 hours/day Type of Home: House Home Layout: One level Home Access: Other (comment) (home totally wheelchair handicapped layout due to previous o) Bathroom Shower/Tub: Multimedia programmer: Handicapped height Bathroom Accessibility: Yes How Accessible: Accessible via wheelchair, Accessible via [place]: No Additional Comments: Pt from [facility] but PCP is Hospital doctor in [place]  Discharge Living Setting Plans for Discharge Living Setting: Patient's home, Lives with (comment) (spouse) Type of Home at Discharge: House Discharge Home Layout: One level, Other (Comment) (wheelchair handicapped home due to previous owner needs) Discharge Home Access: Level entry Discharge Bathroom Shower/Tub: Walk-in shower Discharge Bathroom Toilet: Handicapped height Discharge Bathroom Accessibility: Yes How Accessible: Accessible via wheelchair, Accessible via walker Does the patient have any problems obtaining your medications?: No  Social/Family/Support Systems Patient Roles: Spouse, Parent (has two daughters and two sons) Contact Information: Has spouse but daughters are rotating to be with pt 24/7 in hospital and are main contact Anticipated Caregiver: daughters and spouse Anticipated Caregiver's Contact Information: see above Ability/Limitations of Caregiver: no limitations of daughters Caregiver Availability: 24/7 Discharge Plan Discussed with Primary Caregiver: Yes Is Caregiver In Agreement with Plan?: Yes Does Caregiver/Family have Issues with Lodging/Transportation while Pt is in Rehab?: No (daughters rotate staying with Mom inhospital 24/7)  Goals/Additional Needs Patient/Family Goal for Rehab: supervision to min assist with PT and OT Expected length of stay: ELOS 10 to 14 days Dietary Needs: pt with sips and chips of ice  only Equipment Needs: New to PEG tube feeds. needs to begin cyclic feeds and needs extensive teaching with daughters  Special Service Needs: Pt to follow up with Dr. Benay Spice her oncologist at d/c Additional Information: Rip Harbour, daughter has a pharmacy background Pt/Family Agrees to Admission and willing to participate: Yes Program Orientation Provided & Reviewed with Pt/Caregiver Including Roles  & Responsibilities: Yes  Decrease burden of Care through IP rehab admission: n/a  Possible need for SNF placement upon discharge:no  Patient Condition: This patient's medical and functional status has changed since the consult dated: 07/25/14 in which the Rehabilitation Physician determined and documented that the patient's condition is appropriate for intensive rehabilitative care in an inpatient rehabilitation facility. See "History of Present Illness" (above) for medical update. Functional changes [id number] min to mod assist. Patient's medical and functional status update has been discussed with the Rehabilitation physician and patient remains appropriate for inpatient rehabilitation. Will admit to inpatient rehab today.  Preadmission Screen Completed By:  Cleatrice Burke, 07/28/2014 11:33 AM ______________________________________________________________________   Discussed status with Dr. Letta Pate on 07/28/14 at  1133 and received telephone approval  for admission today.  Admission Coordinator:  Cleatrice Burke, time 5189 Date 07/28/2014.

## 2014-07-28 NOTE — Progress Notes (Signed)
I met with pt and daughter at bedside. Both are in agreement to admission today. I will make the arrangements. [phone]

## 2014-07-28 NOTE — Progress Notes (Signed)
Physical Therapy Treatment Patient Details Name: Tammy Hughes MRN: [id number] DOB: 03/09/1933 Today's Date: 07/28/2014    History of Present Illness 81 year old female who presents with gastric cancer s/p laproscopy with partial gastrectomy and j tube placement    PT Comments    Pt able to ambulate 52' feet today. Continues to be good CIR candidate.  Follow Up Recommendations  CIR;Supervision/Assistance - 24 hour     Equipment Recommendations  Rolling walker with 5" wheels;3in1 (PT)    Recommendations for Other Services       Precautions / Restrictions Precautions Precaution Comments: jp drain, gastrostomy Restrictions Weight Bearing Restrictions: No    Mobility  Bed Mobility Overal bed mobility: Needs Assistance Bed Mobility: Rolling;Sidelying to Sit Rolling: Min assist Sidelying to sit: Min assist       General bed mobility comments: cues for technique  Transfers Overall transfer level: Needs assistance Equipment used: Rolling walker (2 wheeled) Transfers: Sit to/from Stand Sit to Stand: Min assist         General transfer comment: cues for hand placement  Ambulation/Gait Ambulation/Gait assistance: Min assist Ambulation Distance (Feet): 52 Feet Assistive device: Rolling walker (2 wheeled) Gait Pattern/deviations: Step-through pattern;Shuffle Gait velocity: decreased   General Gait Details: cues for posture and to look up   Stairs            Wheelchair Mobility    Modified Rankin (Stroke Patients Only)       Balance     Sitting balance-Leahy Scale: Fair     Standing balance support: Bilateral upper extremity supported Standing balance-Leahy Scale: Poor                      Cognition Arousal/Alertness: Awake/alert Behavior During Therapy: Flat affect Overall Cognitive Status: Within Functional Limits for tasks assessed                      Exercises      General Comments        Pertinent Vitals/Pain  Pain Assessment: No/denies pain Pain Location: c/o nausea    Home Living         Home Access: Other (comment) (home totally wheelchair handicapped layout due to previous o)       Additional Comments: Pt from [facility] but PCP is Tammy Hughes in [place]    Prior Function            PT Goals (current goals can now be found in the care plan section) Acute Rehab PT Goals Patient Stated Goal: to feel better PT Goal Formulation: With patient/family Time For Goal Achievement: 08/05/14 Potential to Achieve Goals: Good Progress towards PT goals: Progressing toward goals    Frequency  Min 3X/week    PT Plan Current plan remains appropriate    Co-evaluation             End of Session Equipment Utilized During Treatment: Gait belt Activity Tolerance: Patient tolerated treatment well Patient left: with call bell/phone within reach;with family/visitor present;in chair;with nursing/sitter in room     Time: 0120-0133 PT Time Calculation (min) (ACUTE ONLY): 13 min  Charges:  $Gait Training: 8-22 mins                    G Codes:      Tammy Hughes 07/28/2014, 2:19 PM

## 2014-07-28 NOTE — Progress Notes (Signed)
Patient ID: Tammy Hughes, female   DOB: 17-Aug-1932, 81 y.o.   MRN: [id number] Patient ID: Tammy Hughes, female   DOB: 1933-03-15, 81 y.o.   MRN: [id number] 8 Days Post-Op  Subjective: Had 5 stools yesterday and threw up a large amount this AM.    Objective: Vital signs in last 24 hours: Temp:  [98.2 F (36.8 C)-98.4 F (36.9 C)] 98.4 F (36.9 C) (01/14 0547) Pulse Rate:  [65-71] 71 (01/14 0547) Resp:  [16-18] 16 (01/14 0547) BP: (131-137)/(52-82) 131/57 mmHg (01/14 0547) SpO2:  [95 %-98 %] 98 % (01/14 0547) Last BM Date: 07/27/14  Intake/Output from previous day: 01/13 0701 - 01/14 0700 In: [place] [P.O.:300; NG/GT:1321] Out: 375 [Emesis/NG output:350; Drains:25] Intake/Output this shift: Total I/O In: 921 [NG/GT:921] Out: -   PE: General- In NAD Abdomen-soft, hypoactive bowel sounds, incision clean and intact.  JP serosang  Lab Results:   Recent Labs  07/27/14 0358  WBC 10.6*  HGB 10.5*  HCT 31.9*  PLT 329   BMET  Recent Labs  07/27/14 0358  NA 136  K 3.7  CL 102  CO2 29  GLUCOSE 249*  BUN 15  CREATININE 0.78  CALCIUM 9.5   PT/INR No results for input(s): LABPROT, INR in the last 72 hours. Comprehensive Metabolic Panel:    Component Value Date/Time   NA 136 07/27/2014 0358   NA 134* 07/25/2014 0637   K 3.7 07/27/2014 0358   K 4.3 07/25/2014 0637   CL 102 07/27/2014 0358   CL 104 07/25/2014 0637   CO2 29 07/27/2014 0358   CO2 24 07/25/2014 0637   BUN 15 07/27/2014 0358   BUN 15 07/25/2014 0637   CREATININE 0.78 07/27/2014 0358   CREATININE 0.80 07/25/2014 0637   GLUCOSE 249* 07/27/2014 0358   GLUCOSE 293* 07/25/2014 0637   CALCIUM 9.5 07/27/2014 0358   CALCIUM 9.2 07/25/2014 0637   CALCIUM 9.9 07/23/2011 0858   AST 48* 05/31/2014 1603   AST 27 08/06/2013 0946   ALT 45* 05/31/2014 1603   ALT 27 08/06/2013 0946   ALKPHOS 66 05/31/2014 1603   ALKPHOS 74 08/06/2013 0946   BILITOT 0.6 05/31/2014 1603   BILITOT 0.4 08/06/2013 0946   PROT 7.5  05/31/2014 1603   PROT 7.5 08/06/2013 0946   ALBUMIN 3.7 05/31/2014 1603   ALBUMIN 3.7 08/06/2013 0946     Studies/Results: No results found.  Anti-infectives: Anti-infectives    Start     Dose/Rate Route Frequency Ordered Stop   07/20/14 1930  ceFAZolin (ANCEF) IVPB 1 g/50 mL premix     1 g100 mL/hr over 30 Minutes Intravenous Every 6 hours 07/20/14 1924 07/21/14 0740   07/20/14 1926  ceFAZolin (ANCEF) 1-5 GM-% IVPB    Comments:  Harris, Celine   : cabinet override      07/20/14 1926 07/21/14 0729   07/20/14 0600  ceFAZolin (ANCEF) IVPB 2 g/50 mL premix     2 g100 mL/hr over 30 Minutes Intravenous On call to O.R. 07/19/14 1405 07/20/14 0858      Assessment Active Problems:   Gastric cancer s/p partial gastrectomy and feeding jejunostomy 07/20/14-UGI yesterday negative for leak-tolerating clear liquids and tube feeds (Glucerna)  Diabetes Mellitus-CBGs   HTN  V-tach- work up in progress by Cardiology; Metoprolol started.    LOS: 8 days   Plan: increase tube feeds.  Rehab pending.   Statin, beta blockade, plavix, heparin for MI.  Pt has aspirin allergy with rash.  Switch to liquid pain meds.   PRN haldol.   Hold on d/c today since emesis.  Go back to sips, then leave tube feeds going.  Once nausea resolves, pt can advance diet.   Will d/c JP today.  Staples can be removed prior to rehab. Hopefully will be ready for rehab tomorrow. Increase Lantus again.     [facility] 07/28/2014

## 2014-07-28 NOTE — Progress Notes (Addendum)
Patient ID: Tammy Hughes, female   DOB: 01-06-1933, 81 y.o.   MRN: [id number] OK to go to rehab today Tube feeds can be cycled.

## 2014-07-29 ENCOUNTER — Inpatient Hospital Stay (HOSPITAL_COMMUNITY): Payer: Medicare Other | Admitting: Physical Therapy

## 2014-07-29 ENCOUNTER — Inpatient Hospital Stay (HOSPITAL_COMMUNITY): Payer: Medicare Other

## 2014-07-29 ENCOUNTER — Inpatient Hospital Stay (HOSPITAL_COMMUNITY): Payer: Medicare Other | Admitting: Occupational Therapy

## 2014-07-29 DIAGNOSIS — R5381 Other malaise: Secondary | ICD-10-CM | POA: Diagnosis present

## 2014-07-29 LAB — COMPREHENSIVE METABOLIC PANEL
ALBUMIN: 2.3 g/dL — AB (ref 3.5–5.2)
ALK PHOS: 161 U/L — AB (ref 39–117)
ALT: 105 U/L — ABNORMAL HIGH (ref 0–35)
AST: 48 U/L — AB (ref 0–37)
Anion gap: 11 (ref 5–15)
BUN: 16 mg/dL (ref 6–23)
CALCIUM: 9.5 mg/dL (ref 8.4–10.5)
CO2: 28 mmol/L (ref 19–32)
Chloride: 100 mEq/L (ref 96–112)
Creatinine, Ser: 0.83 mg/dL (ref 0.50–1.10)
GFR calc Af Amer: 76 mL/min — ABNORMAL LOW (ref >90)
GFR, EST NON AFRICAN AMERICAN: 65 mL/min — AB (ref >90)
GLUCOSE: 241 mg/dL — AB (ref 70–99)
Potassium: 4.2 mmol/L (ref 3.5–5.1)
Sodium: 139 mmol/L (ref 135–145)
TOTAL PROTEIN: 7.2 g/dL (ref 6.0–8.3)
Total Bilirubin: 0.3 mg/dL (ref 0.3–1.2)

## 2014-07-29 LAB — CBC WITH DIFFERENTIAL/PLATELET
BASOS PCT: 0 % (ref 0–1)
Basophils Absolute: 0 10*3/uL (ref 0.0–0.1)
EOS ABS: 0.2 10*3/uL (ref 0.0–0.7)
EOS PCT: 1 % (ref 0–5)
HCT: 33.8 % — ABNORMAL LOW (ref 36.0–46.0)
HEMOGLOBIN: 11.1 g/dL — AB (ref 12.0–15.0)
Lymphocytes Relative: 13 % (ref 12–46)
Lymphs Abs: 2.1 10*3/uL (ref 0.7–4.0)
MCH: 28.7 pg (ref 26.0–34.0)
MCHC: 32.8 g/dL (ref 30.0–36.0)
MCV: 87.3 fL (ref 78.0–100.0)
MONOS PCT: 6 % (ref 3–12)
Monocytes Absolute: 1 10*3/uL (ref 0.1–1.0)
NEUTROS PCT: 80 % — AB (ref 43–77)
Neutro Abs: 12.6 10*3/uL — ABNORMAL HIGH (ref 1.7–7.7)
PLATELETS: 396 10*3/uL (ref 150–400)
RBC: 3.87 MIL/uL (ref 3.87–5.11)
RDW: 14.1 % (ref 11.5–15.5)
WBC: 15.9 10*3/uL — AB (ref 4.0–10.5)

## 2014-07-29 LAB — GLUCOSE, CAPILLARY
GLUCOSE-CAPILLARY: 223 mg/dL — AB (ref 70–99)
GLUCOSE-CAPILLARY: 214 mg/dL — AB (ref 70–99)
Glucose-Capillary: 212 mg/dL — ABNORMAL HIGH (ref 70–99)
Glucose-Capillary: 185 mg/dL — ABNORMAL HIGH (ref 70–99)
Glucose-Capillary: 221 mg/dL — ABNORMAL HIGH (ref 70–99)

## 2014-07-29 MED ORDER — LORAZEPAM 1 MG PO TABS
1.0000 mg | ORAL_TABLET | Freq: Three times a day (TID) | ORAL | Status: DC | PRN
  Administered 2014-07-29: 1 mg
  Filled 2014-07-29: qty 1

## 2014-07-29 MED ORDER — PANTOPRAZOLE SODIUM 40 MG PO PACK
40.0000 mg | PACK | Freq: Every day | ORAL | Status: DC
  Administered 2014-07-29 – 2014-08-10 (×13): 40 mg
  Filled 2014-07-29 (×14): qty 20

## 2014-07-29 MED ORDER — ACETAMINOPHEN 325 MG PO TABS
650.0000 mg | ORAL_TABLET | ORAL | Status: DC | PRN
  Administered 2014-07-29 – 2014-08-09 (×4): 650 mg
  Filled 2014-07-29 (×5): qty 2

## 2014-07-29 NOTE — Progress Notes (Signed)
Patient complained of nausea, feeling anxious, and "being hot". Vitals taken: Temp 101.1; BP 139/58; Pulse 74; O2 95%.  Patient had two granddaughters in room at the time and wanted all her children to be at the hospital with her.  On call MD called notified about situation and received orders for CBC, UA, portable CXR, tylenol 650 mg every 4 hours PRN, and ativan 1 mg 3 times daily PRN.  Will continue to monitor.

## 2014-07-29 NOTE — Progress Notes (Signed)
81 y.o. right handed female with recent diagnosis of new gastric cancer followed by gastroenterology Dr. Deatra Ina that was incidentally discovered upon endoscopy to follow-up for esophageal stricture. Biopsies positive for adenocarcinoma. Patient independent prior to admission living with her husband. Admitted 07/20/2014 underwent proximal gastrectomy and feeding jejunostomy per Dr. Barry Dienes. Hospital course pain management. On 07/24/2014 with 18 beats of NSVT. EKG showed normal sinus rhythm with borderline LVH. There was no chest pain or shortness of breath. Cardiology service is consulted. Troponin mildly elevated 0.60. Echocardiogram with ejection fraction of 60% without PFO motion abnormalities.. Placed on low-dose metoprolol. Tube feeds have been initiated and cycling of tube feeds and she remains nothing by mouth other than ice chips. Bouts of confusion and restlessness at night. Physical therapy evaluation completed 07/22/2014 recommendations CIR consult Subjective/Complaints:   Objective: Vital Signs: Blood pressure 135/54, pulse 73, temperature 98.2 F (36.8 C), temperature source Oral, resp. rate 17, height 5' (1.524 m), weight 70.126 kg (154 lb 9.6 oz), SpO2 100 %. No results found. Results for orders placed or performed during the hospital encounter of 07/28/14 (from the past 72 hour(s))  Glucose, capillary     Status: Abnormal   Collection Time: 07/28/14  4:34 PM  Result Value Ref Range   Glucose-Capillary 147 (H) 70 - 99 mg/dL  Glucose, capillary     Status: Abnormal   Collection Time: 07/28/14  8:39 PM  Result Value Ref Range   Glucose-Capillary 271 (H) 70 - 99 mg/dL  Glucose, capillary     Status: Abnormal   Collection Time: 07/28/14 11:48 PM  Result Value Ref Range   Glucose-Capillary 276 (H) 70 - 99 mg/dL  Glucose, capillary     Status: Abnormal   Collection Time: 07/29/14  3:52 AM  Result Value Ref Range   Glucose-Capillary 223 (H) 70 - 99 mg/dL  CBC WITH DIFFERENTIAL      Status: Abnormal   Collection Time: 07/29/14  7:35 AM  Result Value Ref Range   WBC 15.9 (H) 4.0 - 10.5 K/uL   RBC 3.87 3.87 - 5.11 MIL/uL   Hemoglobin 11.1 (L) 12.0 - 15.0 g/dL   HCT 33.8 (L) 36.0 - 46.0 %   MCV 87.3 78.0 - 100.0 fL   MCH 28.7 26.0 - 34.0 pg   MCHC 32.8 30.0 - 36.0 g/dL   RDW 14.1 11.5 - 15.5 %   Platelets 396 150 - 400 K/uL   Neutrophils Relative % 80 (H) 43 - 77 %   Neutro Abs 12.6 (H) 1.7 - 7.7 K/uL   Lymphocytes Relative 13 12 - 46 %   Lymphs Abs 2.1 0.7 - 4.0 K/uL   Monocytes Relative 6 3 - 12 %   Monocytes Absolute 1.0 0.1 - 1.0 K/uL   Eosinophils Relative 1 0 - 5 %   Eosinophils Absolute 0.2 0.0 - 0.7 K/uL   Basophils Relative 0 0 - 1 %   Basophils Absolute 0.0 0.0 - 0.1 K/uL     HEENT: normal Cardio: RRR and no murmur Resp: CTA B/L and unlabored GI: BS positive and no distension Extremity:  No Edema Skin:   Wound C/D/I Neuro: Alert/Oriented, Normal Sensory and Abnormal Motor 4/5 Bilateral UE, and LE Musc/Skel:  Normal and LB tender Gen NAD   Assessment/Plan: 1. Functional deficits secondary to severe debilitation related to gastric cancer and subsequent gastrectomy with jejunostomy tube 07/20/2014  which require 3+ hours per day of interdisciplinary therapy in a comprehensive inpatient rehab setting. Physiatrist is providing close  team supervision and 24 hour management of active medical problems listed below. Physiatrist and rehab team continue to assess barriers to discharge/monitor patient progress toward functional and medical goals. FIM:             FIM - Control and instrumentation engineer Devices: Bed rails Bed/Chair Transfer: 4: Chair or W/C > Bed: Min A (steadying Pt. > 75%)     Comprehension Comprehension Mode: Auditory Comprehension: 5-Follows basic conversation/direction: With no assist  Expression Expression Mode: Verbal Expression: 5-Expresses basic 90% of the time/requires cueing < 10% of the  time.  Social Interaction Social Interaction: 5-Interacts appropriately 90% of the time - Needs monitoring or encouragement for participation or interaction.  Problem Solving Problem Solving: 5-Solves basic 90% of the time/requires cueing < 10% of the time  Memory Memory: 5-Recognizes or recalls 90% of the time/requires cueing < 10% of the time  Medical Problem List and Plan: 1. Functional deficits secondary to severe debilitation related to gastric cancer and subsequent gastrectomy with jejunostomy tube 07/20/2014 2. DVT Prophylaxis/Anticoagulation: SCDs. Monitor for any signs of DVT 3. Pain Management: Hycet as needed. Monitor with increased mobility 4. Mood/restlessness: Check sleep chart. Bed alarm for safety 5. Neuropsych: This patient is capable of making decisions on her own behalf. 6. Skin/Wound Care: Routine skin checks of surgical sites as well as routine turning to maintain skin 7. Fluids/Electrolytes/Nutrition: Strict I and O with follow-up chemistries. Dietary consult for tube feedings 8. Asymptomatic VT. follow-up cardiology services. Lopressor 25 mg twice a day, Lasix 20 mg daily. Cardiac rate controlled 9. Diabetes mellitus with peripheral neuropathy. Latest hemoglobin A1c 8.1. Lantus insulin 35units daily at bedtime. Check blood sugars before meals and at bedtime. Monitor closely while on tube feeds 10. Hyperlipidemia. Crestor 11. Decreased nutritional storage. Continue nutritional support via jejunostomy tube. Dietary follow-up for cycling of tube feeds. Patient is to remain nothing by mouth other than ice chips  LOS (Days) 1 A FACE TO [place] E 07/29/2014, 8:37 AM

## 2014-07-29 NOTE — Progress Notes (Signed)
Social Work Assessment and Plan Social Work Assessment and Plan  Patient Details  Name: Tammy Hughes MRN: [id number] Date of Birth: 1933/04/09  Today's Date: 07/29/2014  Problem List:  Patient Active Problem List   Diagnosis Date Noted  . Debility 07/29/2014  . Elevated troponin post op - 0.6 07/26/2014  . Aspirin allergy 07/26/2014  . Type 2 diabetes mellitus 07/26/2014  . NSVT post-op 07/24/2014  . Gastric cancer-s/p gastrectomy 07/20/14 06/15/2014  . Dysphagia 04/15/2014  . Pain in joint, lower leg 03/15/2014  . Cough 08/06/2013  . Postinflammatory pulmonary fibrosis 08/06/2013  . NASH (nonalcoholic steatohepatitis) 10/21/2011  . Knee pain, bilateral 10/21/2011  . Encounter for long-term (current) use of other medications 11/23/2010  . Carpal tunnel syndrome of left wrist 11/23/2010  . RECTAL BLEEDING 03/21/2010  . BACK PAIN, LUMBAR 12/19/2009  . TRICHOMONAL URETHRITIS 11/06/2009  . LEUKOPENIA, MILD 02/15/2009  . ANXIETY 03/08/2008  . Essential hypertension 03/08/2008  . Non-allergic rhinitis 03/08/2008  . DIVERTICULOSIS, COLON 03/08/2008  . HYPERCHOLESTEROLEMIA 02/01/2008  . DIABETES MELLITUS, TYPE I 02/23/2007  . PERIPHERAL NEUROPATHY 02/23/2007  . GERD 02/23/2007  . OSTEOARTHRITIS 02/23/2007  . OSTEOPOROSIS 02/23/2007   Past Medical History:  Past Medical History  Diagnosis Date  . HYPERCHOLESTEROLEMIA 02/01/2008  . ANXIETY 03/08/2008  . PERIPHERAL NEUROPATHY 02/23/2007  . HYPERTENSION 03/08/2008  . GERD 02/23/2007  . DIVERTICULOSIS, COLON 03/08/2008  . OSTEOARTHRITIS 02/23/2007  . OSTEOPOROSIS 02/23/2007  . Urolithiasis   . Allergy     SEASONAL  . Cataract     REMOVED BILATERAL  . DIABETES MELLITUS, TYPE I 02/23/2007  . Depression    Past Surgical History:  Past Surgical History  Procedure Laterality Date  . Cholecystectomy  1995  . Esophagogastroduodenoscopy  06/08/2002  . Gastrectomy N/A 07/20/2014    Procedure: PROXIMAL GASTRECTOMY;  Surgeon: Stark Klein, MD;  Location: [facility];  Service: General;  Laterality: N/A;  . Laparoscopy N/A 07/20/2014    Procedure: LAPAROSCOPY DIAGNOSTIC;  Surgeon: Stark Klein, MD;  Location: [facility];  Service: General;  Laterality: N/A;  . Gastrojejunostomy N/A 07/20/2014    Procedure: GASTROJEJUNOSTOMY;  Surgeon: Stark Klein, MD;  Location: [facility];  Service: General;  Laterality: N/A;   Social History:  reports that she has never smoked. She has never used smokeless tobacco. She reports that she does not drink alcohol or use illicit drugs.  Family / Support Systems Marital Status: Married Patient Roles: Spouse, Parent, Volunteer Spouse/Significant Other: Ben  [phone] Children: Melina Landis-daughter  [phone]-cell Other Supports: Animator  [phone] Anticipated Caregiver: Family members Ability/Limitations of Caregiver: No limitations aware will need 24 hr care and will provide this Caregiver Availability: 24/7 Family Dynamics: Very storng and committed fmaily they have four children who are all locally here, couple moved to [place] when retired.  Daugther's stay here or always have someone here with pt.  Will make sure she has whatever she needs at home  Social History Preferred language: English Religion: Baptist Cultural Background: No issues Education: [employer] Read: Yes Write: Yes Employment Status: Retired Freight forwarder Issues: No issues Guardian/Conservator: None-according to MD pt is capable of making her own decisions while here, but always a family member here with her.   Abuse/Neglect Physical Abuse: Denies Verbal Abuse: Denies Sexual Abuse: Denies Exploitation of patient/patient's resources: Denies Self-Neglect: Denies  Emotional Status Pt's affect, behavior adn adjustment status: Pt is motivated and glad to be on rehab last stop before going home.  She has always been  one to be very independent and helped others, this is different for her.  She  will do her part and work hard here. Recent Psychosocial Issues: other medical issues were managed before this Pyschiatric History: History of anxiety/depression already takes meds for which she feels help her.  May benefit from Neuro-psych seeing due to new diagnosis and unknown.  She has a very strong aith which pulls her through. Substance Abuse History: No issues  Patient / Family Perceptions, Expectations & Goals Pt/Family understanding of illness & functional limitations: Pt and duaghter's have a good understanding of her diagnosis and ask quesitons of MD daily to find out as mcuh information as MD knows at this time.  Aware will need to follow up with Onc-Dr. Benay Spice after discharge for further treatment plans. Premorbid pt/family roles/activities: Wife, Mother, grandmother, retiree, church member, Psychologist, occupational, Social research officer, government Anticipated changes in roles/activities/participation: resume if able too Pt/family expectations/goals: Pt states: " I want to be able to do as much for myself as I can."  Daugther states: " We will do whatever we need to do."  Daughter states: " We plan to rotate and always have someone with her and Dad."  [employer]: None Premorbid Home Care/DME Agencies: None Transportation available at discharge: [employer] referrals recommended: Neuropsychology, Support group (specify)  Discharge Planning Living Arrangements: Spouse/significant other Support Systems: Spouse/significant other, Children, Other relatives, Water engineer, Social worker community Type of Residence: Private residence Insurance underwriter Resources: [employer], Multimedia programmer (specify) Printmaker) Museum/gallery curator Resources: Radio broadcast assistant Screen Referred: No Living Expenses: Lives with family Money Management: Spouse, Patient Does the patient have any problems obtaining your medications?: No Home Management: Patient Patient/Family Preliminary Plans: Return home with husband  and another family member to assist also.  They will do whatever they need to do for her, daughter's very committed.  Await team's evaluations and see LOS along wiht goals. Social Work Anticipated Follow Up Needs: HH/OP, Support Group  Clinical Impression Pleasant motivated patient who is willing to work in therapies, even though having pain issues.  Two daughter';s were present and very supportive and committed to pt.  Here to provide support and Learn her care.  They plan to rotate being with her here and at home. Unsure what future treatment recommended and will follow up with Dr. Benay Spice as OP.  May benefit from Neuro-psych while here due to new  Diagnosis.  Work on discharge plans and provide support.  Elease Hashimoto 07/29/2014, 11:04 AM

## 2014-07-29 NOTE — Progress Notes (Addendum)
Inpatient Diabetes Program Recommendations  AACE/ADA: New Consensus Statement on Inpatient Glycemic Control (2013)  Target Ranges:  Prepandial:   less than 140 mg/dL      Peak postprandial:   less than 180 mg/dL (1-2 hours)      Critically ill patients:  140 - 180 mg/dL    Results for Tammy Hughes, Tammy Hughes (MRN [id number]) as of 07/29/2014 11:07  Ref. Range 07/28/2014 00:01 07/28/2014 04:07 07/28/2014 07:41 07/28/2014 12:06 07/28/2014 16:34 07/28/2014 20:39  Glucose-Capillary Latest Range: 70-99 mg/dL 174 (H) 210 (H) 197 (H) 245 (H) 147 (H) 271 (H)    Results for Tammy Hughes, Tammy Hughes (MRN [id number]) as of 07/29/2014 11:07  Ref. Range 07/28/2014 23:48 07/29/2014 03:52 07/29/2014 08:23  Glucose-Capillary Latest Range: 70-99 mg/dL 276 (H) 223 (H) 214 (H)    Home Diabetes meds: 70/30 insulin- 70 units QAM with breakfast/ 60 units QPM with supper   Current orders: Lantus 35 units QHS        Novolog 0-20 units Q4H    Inpatient Diabetes Program Recommendations: Insulin - Basal: Please consider increasing Lantus to 40 units QHS Insulin - Meal Coverage: Please consider adding Novolog Tube Feed coverage- Novolog 5 units Q4H for tube feeding coverage (in addition to Novolog correction scale)     Will follow Wyn Quaker RN, MSN, CDE Diabetes Coordinator Inpatient Diabetes Program Team Pager: [phone] (8a-10p)

## 2014-07-29 NOTE — Care Management Note (Signed)
[facility] Individual Statement of Services  Patient Name:  Tammy Hughes  Date:  07/29/2014  Welcome to the [facility].  Our goal is to provide you with an individualized program based on your diagnosis and situation, designed to meet your specific needs.  With this comprehensive rehabilitation program, you will be expected to participate in at least 3 hours of rehabilitation therapies Monday-Friday, with modified therapy programming on the weekends.  Your rehabilitation program will include the following services:  Physical Therapy (PT), Occupational Therapy (OT), Speech Therapy (ST), 24 hour per day rehabilitation nursing, Neuropsychology, Case Management (Social Worker), Rehabilitation Medicine, Nutrition Services and Pharmacy Services  Weekly team conferences will be held on Wednesday to discuss your progress.  Your Social Worker will talk with you frequently to get your input and to update you on team discussions.  Team conferences with you and your family in attendance may also be held.  Expected length of stay: 12-14 days  Overall anticipated outcome: supervision set-up  Depending on your progress and recovery, your program may change. Your Social Worker will coordinate services and will keep you informed of any changes. Your Social Worker's name and contact numbers are listed  below.  The following services may also be recommended but are not provided by the [facility] will be made to provide these services after discharge if needed.  Arrangements include referral to agencies that provide these services.  Your insurance has been verified to be:  [employer] Your primary doctor is:  Renato Shin  Pertinent information will be shared with your doctor and your insurance company.  Social Worker:   Ovidio Kin, [place] or ([phone]  Information discussed with and copy given to patient by: Elease Hashimoto, 07/29/2014, 9:02 AM

## 2014-07-29 NOTE — Evaluation (Signed)
Physical Therapy Assessment and Plan  Patient Details  Name: Tammy Hughes MRN: [id number] Date of Birth: [birth year]  PT Diagnosis: Abnormality of gait, Dizziness and giddiness, Impaired cognition and Muscle weakness Rehab Potential: Good ELOS: 12-14 days   Today's Date: 07/29/2014 PT Individual Time: 5361-4431 and 5400-8676 PT Individual Time Calculation (min): 70 min and 50 min  Problem List:  Patient Active Problem List   Diagnosis Date Noted  . Debility 07/29/2014  . Elevated troponin post op - 0.6 07/26/2014  . Aspirin allergy 07/26/2014  . Type 2 diabetes mellitus 07/26/2014  . NSVT post-op 07/24/2014  . Gastric cancer-s/p gastrectomy 07/20/14 06/15/2014  . Dysphagia 04/15/2014  . Pain in joint, lower leg 03/15/2014  . Cough 08/06/2013  . Postinflammatory pulmonary fibrosis 08/06/2013  . NASH (nonalcoholic steatohepatitis) 10/21/2011  . Knee pain, bilateral 10/21/2011  . Encounter for long-term (current) use of other medications 11/23/2010  . Carpal tunnel syndrome of left wrist 11/23/2010  . RECTAL BLEEDING 03/21/2010  . BACK PAIN, LUMBAR 12/19/2009  . TRICHOMONAL URETHRITIS 11/06/2009  . LEUKOPENIA, MILD 02/15/2009  . ANXIETY 03/08/2008  . Essential hypertension 03/08/2008  . Non-allergic rhinitis 03/08/2008  . DIVERTICULOSIS, COLON 03/08/2008  . HYPERCHOLESTEROLEMIA 02/01/2008  . DIABETES MELLITUS, TYPE I 02/23/2007  . PERIPHERAL NEUROPATHY 02/23/2007  . GERD 02/23/2007  . OSTEOARTHRITIS 02/23/2007  . OSTEOPOROSIS 02/23/2007    Past Medical History:  Past Medical History  Diagnosis Date  . HYPERCHOLESTEROLEMIA 02/01/2008  . ANXIETY 03/08/2008  . PERIPHERAL NEUROPATHY 02/23/2007  . HYPERTENSION 03/08/2008  . GERD 02/23/2007  . DIVERTICULOSIS, COLON 03/08/2008  . OSTEOARTHRITIS 02/23/2007  . OSTEOPOROSIS 02/23/2007  . Urolithiasis   . Allergy     SEASONAL  . Cataract     REMOVED BILATERAL  . DIABETES MELLITUS, TYPE I 02/23/2007  . Depression    Past  Surgical History:  Past Surgical History  Procedure Laterality Date  . Cholecystectomy  1995  . Esophagogastroduodenoscopy  06/08/2002  . Gastrectomy N/A 07/20/2014    Procedure: PROXIMAL GASTRECTOMY;  Surgeon: Stark Klein, MD;  Location: [facility];  Service: General;  Laterality: N/A;  . Laparoscopy N/A 07/20/2014    Procedure: LAPAROSCOPY DIAGNOSTIC;  Surgeon: Stark Klein, MD;  Location: [facility];  Service: General;  Laterality: N/A;  . Gastrojejunostomy N/A 07/20/2014    Procedure: GASTROJEJUNOSTOMY;  Surgeon: Stark Klein, MD;  Location: [facility];  Service: General;  Laterality: N/A;    Assessment & Plan Clinical Impression: Tammy Hughes is a 81 y.o. right handed female with recent diagnosis of new gastric cancer followed by gastroenterology Dr. Deatra Ina that was incidentally discovered upon endoscopy to follow-up for esophageal stricture. Biopsies positive for adenocarcinoma. Patient independent prior to admission living with her husband. Admitted 07/20/2014 underwent proximal gastrectomy and feeding jejunostomy per Dr. Barry Dienes. Hospital course pain management. On 07/24/2014 with 18 beats of NSVT. EKG showed normal sinus rhythm with borderline LVH. There was no chest pain or shortness of breath. Cardiology service is consulted. Troponin mildly elevated 0.60. Echocardiogram with ejection fraction of 60% without PFO motion abnormalities.. Placed on low-dose metoprolol. Tube feeds have been initiated and cycling of tube feeds and she remains nothing by mouth other than ice chips. Bouts of confusion and restlessness at night. Patient transferred to CIR on 07/28/2014 .   Patient currently requires min with mobility secondary to muscle weakness, decreased activity tolerance, unbalanced muscle activation, decreased short term memory and dizziness (unknown origin). Prior to hospitalization, patient was independent  with mobility and lived with Spouse  in a House home.  Home access is  Other (comment) (home totally w/c  accessible).  Patient will benefit from skilled PT intervention to maximize safe functional mobility and minimize fall risk for planned discharge home with 24 hour supervision.  Anticipate patient will HHPT vs OPPT at discharge.  PT - End of Session Activity Tolerance: Tolerates 30+ min activity with multiple rests Endurance Deficit: Yes Endurance Deficit Description: Pt required >5 prolonged seated rest breaks during Berg Balance Scale.  PT Assessment Rehab Potential (ACUTE/IP ONLY): Good Barriers to Discharge: Other (comment) (None) PT Patient demonstrates impairments in the following area(s): Balance;Skin Integrity;Endurance;Motor;Safety;Sensory PT Transfers Functional Problem(s): Bed Mobility;Bed to Chair;Car;Furniture PT Locomotion Functional Problem(s): Ambulation;Wheelchair Mobility;Stairs PT Plan PT Intensity: Minimum of 1-2 x/day ,45 to 90 minutes PT Frequency: 5 out of 7 days PT Duration Estimated Length of Stay: 12-14 days PT Treatment/Interventions: Ambulation/gait training;Balance/vestibular training;Cognitive remediation/compensation;Discharge planning;DME/adaptive equipment instruction;Functional mobility training;Patient/family education;Neuromuscular re-education;Skin care/wound management;Splinting/orthotics;Therapeutic Exercise;Therapeutic Activities;Stair training;UE/LE Strength taining/ROM;UE/LE Coordination activities;Wheelchair propulsion/positioning PT Transfers Anticipated Outcome(s): Supervision PT Locomotion Anticipated Outcome(s): Supervision PT Recommendation Follow Up Recommendations: Home health PT;Outpatient PT;24 hour supervision/assistance (HHPT vs OPPT (TBD)) Patient destination: Home Equipment Recommended: Standard walker Equipment Details: Pt will likely need youth rolling walker.  Skilled Therapeutic Intervention Treatment Session 1:  PT evaluation performed. See below for detailed findings. Treatment initiated. Session focused on transfer training,  gait training, stair negotiation, and w/c mobility. See below for detailed description of assist/cueing required with bed mobility, transfers, w/c mobility, gait and stairs. Educated pt and two daughters on findings, goals, and plan of care. Oriented pt and family to rehab unit, fall precautions. Pt/family verbalized understanding of all education and was in full agreement with plan of care. Departed with pt semi reclined in bed with daughters and CSW present, all needs within reach.  Treatment Session 2: Pt received semi reclined in bed with husband present. Pt reporting increased fatigue but agreeable to therapy. Session focused on assessing/addressing dynamic standing balance. Completed Berg Balance Scale (BBS) with score of 18/56. See below for detailed findings. Educated pt/husband on BBS score and implications with focus on increased risk of falling. Pt/husband verbalized understanding. Session ended in pt room, where pt was left semi reclined in bed with husband present and all needs within reach.  PT Evaluation Precautions/Restrictions Precautions Precautions: Fall Precaution Comments: jejunostomy tube, gastrostomy Restrictions Weight Bearing Restrictions: No General   Vital SignsTherapy Vitals Temp: 98.2 F (36.8 C) Temp Source: Oral Pulse Rate: 73 Resp: 16 BP: (!) 152/58 mmHg Patient Position (if appropriate): Lying Oxygen Therapy SpO2: 96 % Pain Pain Assessment Pain Assessment: No/denies pain Home Living/Prior Functioning Home Living Available Help at Discharge: Family;Available 24 hours/day Type of Home: House Home Access: Other (comment) (home totally wheelchair accessible) Home Layout: One level Additional Comments: Pt [facility] but PCP is Loanne Drilling in [place]  Lives With: Spouse Prior Function Level of Independence: Independent with basic ADLs;Independent with homemaking with ambulation;Independent with gait;Independent with transfers  Able to Take Stairs?:  Yes Driving: Yes Vocation: Part time employment Vocation Requirements: Did house cleaning once per week Leisure: Hobbies-yes (Comment) Comments: Pt enjoys participating in church activities. Vision/Perception  Vision - Assessment Eye Alignment: Within Functional Limits Ocular Range of Motion: Within Functional Limits Alignment/Gaze Preference: Within Defined Limits Tracking/Visual Pursuits: Able to track stimulus in all quads without difficulty  Cognition Orientation Level: Oriented to person;Oriented to place;Oriented to situation;Disoriented to time Sensation Sensation Light Touch: Appears Intact (bilat LE's) Proprioception: Impaired by gross assessment Additional  Comments: Proprioception grossly intact, with exception of some discrepancy in bilat feet. Coordination Heel Shin Test: Grossly intact and equall bilaterally. Motor  Motor Motor: Abnormal postural alignment and control;Other (comment) Motor - Skilled Clinical Observations: generalized weakness, decreased postural control in standing  Mobility Bed Mobility Bed Mobility: Supine to Sit;Sit to Supine;Scooting to HOB Supine to Sit: 5: Supervision;HOB flat Sit to Supine: HOB flat;5: Supervision Sit to Supine - Details: Verbal cues for technique Scooting to HOB: 4: Min assist;With rail Scooting to [facility] Details: Verbal cues for technique;Tactile cues for placement;Tactile cues for weight bearing Scooting to [facility] Details (indicate cue type and reason): Multimodal cueing for use of rails, for bilat LE weightbearing to scoot to [facility]. Transfers Transfers: Yes Sit to Stand: 4: Min assist;From bed;With upper extremity assist;4: Min guard Sit to Stand Details: Verbal cues for technique Stand to Sit: 4: Min guard;To bed;To chair/3-in-1;4: Min assist Stand to Sit Details (indicate cue type and reason): Verbal cues for precautions/safety Stand Pivot Transfers: 4: Min assist Stand Pivot Transfer Details: Tactile cues for weight  shifting;Verbal cues for precautions/safety Stand Pivot Transfer Details (indicate cue type and reason): Tactile cueing for lateral weight shift to L side; verbal cueing for safe proximity to bed, w/c prior to sitting. Locomotion  Ambulation Ambulation: Yes Ambulation/Gait Assistance: 4: Min assist Ambulation Distance (Feet): 75 Feet Assistive device: None;1 person hand held assist (L HHA for obstacle negotiation) Ambulation/Gait Assistance Details: Tactile cues for weight shifting;Verbal cues for gait pattern Ambulation/Gait Assistance Details: Pt ambulated x75' in controled environment without assistive device requirin min A for linear ambulation in uncrowded hallway; required L HHA for obstacle negotiation in room. Gait Gait: Yes Gait Pattern: Impaired Gait Pattern: Step-through pattern;Decreased step length - left;Decreased stance time - right;Poor foot clearance - right;Lateral trunk lean to right;Decreased weight shift to left;Decreased dorsiflexion - right Gait velocity: Self-selected gait speed = .24 m/s Stairs / Additional Locomotion Stairs: Yes Stairs Assistance: 5: Supervision Stairs Assistance Details: Verbal cues for precautions/safety Stairs Assistance Details (indicate cue type and reason): Pt negotiated 5 steps with 2 rails, ascending with reciprocal pattern and descending with step-to pattern. Stair Management Technique: Two rails;Alternating pattern;Step to pattern;Forwards Number of Stairs: 5 Architect: Yes Wheelchair Assistance: 4: Advertising account executive Details: Tactile cues for [employer] beaing;Visual cues/gestures for Astronomer: Both upper extremities;Both lower extermities Wheelchair Parts Management: Needs assistance Distance: 75  Trunk/Postural Assessment  Cervical Assessment Cervical Assessment: Within Functional Limits Thoracic Assessment Thoracic Assessment: Within Functional Limits Lumbar  Assessment Lumbar Assessment: Within Functional Limits Postural Control Postural Control: Deficits on evaluation Trunk Control: LOB to R side, posteriorly when balance challenged in standing. Righting Reactions: With multiple posterior losses of balance in standing, noted consistent stepping strategy but minimal/ineffective hip strategy and delayed ankle strategy.  Balance Balance Balance Assessed: Yes Standardized Balance Assessment Standardized Balance Assessment: Berg Balance Test Berg Balance Test Sit to Stand: Able to stand using hands after several tries Standing Unsupported: Able to stand 2 minutes with supervision Sitting with Back Unsupported but Feet Supported on Floor or Stool: Able to sit safely and securely 2 minutes Stand to Sit: Uses backs of legs against chair to control descent Transfers: Needs one person to assist Standing Unsupported with Eyes Closed: Able to stand 10 seconds with supervision Standing Ubsupported with Feet Together: Able to place feet together independently but unable to hold for 30 seconds (posterior LOB) From Standing, Reach Forward with Outstretched Arm: Reaches forward but needs supervision From  Standing Position, Pick up Object from Floor: Unable to try/needs assist to keep balance From Standing Position, Turn to Look Behind Over each Shoulder: Needs assist to keep from losing balance and falling (posterior LOB) Turn 360 Degrees: Needs assistance while turning (posterior LOB) Standing Unsupported, Alternately Place Feet on Step/Stool: Needs assistance to keep from falling or unable to try Standing Unsupported, One Foot in Front: Loses balance while stepping or standing Standing on One Leg: Unable to try or needs assist to prevent fall Total Score: 18 Dynamic Sitting Balance Dynamic Sitting - Balance Support: No upper extremity supported;During functional activity;Feet unsupported Dynamic Sitting - Balance Activities: Forward lean/weight  shifting;Lateral lean/weight shifting Sitting balance - Comments: Seated EOB without UE/LE support, pt donned shoes with minimal posterior trunk lean but no overt LOB. Dynamic Standing Balance Dynamic Standing - Balance Support: No upper extremity supported Dynamic Standing - Level of Assistance: 5: Stand by assistance;4: Min assist Dynamic Standing - Comments: See Berg Balance Scale above. Extremity Assessment  RUE Assessment RUE Assessment: Within Functional Limits (strength grossly 4/5) LUE Assessment LUE Assessment: Within Functional Limits (strength grossly 4/5) RLE Assessment RLE Assessment: Exceptions to [facility] RLE Strength RLE Overall Strength: Deficits RLE Overall Strength Comments: Grossly 4/5. LLE Assessment LLE Assessment: Exceptions to [facility] LLE Strength LLE Overall Strength: Deficits LLE Overall Strength Comments: Grossly 4/5.  FIM:  FIM - Control and instrumentation engineer Devices: HOB elevated;Arm rests (HOB >30 degrees) Bed/Chair Transfer: 4: Chair or W/C > Bed: Min A (steadying Pt. > 75%);4: Bed > Chair or W/C: Min A (steadying Pt. > 75%);5: Supine > Sit: Supervision (verbal cues/safety issues);5: Sit > Supine: Supervision (verbal cues/safety issues) FIM - Locomotion: Wheelchair Distance: 75 Locomotion: Wheelchair: 2: Travels 50 - 149 ft with minimal assistance (Pt.>75%) FIM - Locomotion: Ambulation Locomotion: Ambulation Assistive Devices: Other (comment) (No AD) Ambulation/Gait Assistance: 4: Min assist Locomotion: Ambulation: 2: Travels 50 - 149 ft with minimal assistance (Pt.>75%) FIM - Locomotion: Stairs Locomotion: Scientist, physiological: Hand rail - 2 Locomotion: Stairs: 2: Up and Down 4 - 11 stairs with supervision/safety concerns/verbal cues   Refer to Care Plan for Long Term Goals  Recommendations for other services: None  Discharge Criteria: Patient will be discharged from PT if patient refuses treatment 3 consecutive times without  medical reason, if treatment goals not met, if there is a change in medical status, if patient makes no progress towards goals or if patient is discharged from hospital.  The above assessment, treatment plan, treatment alternatives and goals were discussed and mutually agreed upon: by patient and by family  Stefano Gaul 07/29/2014, 6:36 PM

## 2014-07-29 NOTE — Evaluation (Signed)
Occupational Therapy Assessment and Plan  Patient Details  Name: Tammy Hughes MRN: [id number] Date of Birth: January 20, 1933  OT Diagnosis: muscle weakness (generalized) Rehab Potential: Rehab Potential (ACUTE ONLY): Good ELOS: 12-14 days   Today's Date: 07/29/2014 OT Individual Time: 6578-4696 OT Individual Time Calculation (min): 60 min     Problem List:  Patient Active Problem List   Diagnosis Date Noted  . Debility 07/29/2014  . Elevated troponin post op - 0.6 07/26/2014  . Aspirin allergy 07/26/2014  . Type 2 diabetes mellitus 07/26/2014  . NSVT post-op 07/24/2014  . Gastric cancer-s/p gastrectomy 07/20/14 06/15/2014  . Dysphagia 04/15/2014  . Pain in joint, lower leg 03/15/2014  . Cough 08/06/2013  . Postinflammatory pulmonary fibrosis 08/06/2013  . NASH (nonalcoholic steatohepatitis) 10/21/2011  . Knee pain, bilateral 10/21/2011  . Encounter for long-term (current) use of other medications 11/23/2010  . Carpal tunnel syndrome of left wrist 11/23/2010  . RECTAL BLEEDING 03/21/2010  . BACK PAIN, LUMBAR 12/19/2009  . TRICHOMONAL URETHRITIS 11/06/2009  . LEUKOPENIA, MILD 02/15/2009  . ANXIETY 03/08/2008  . Essential hypertension 03/08/2008  . Non-allergic rhinitis 03/08/2008  . DIVERTICULOSIS, COLON 03/08/2008  . HYPERCHOLESTEROLEMIA 02/01/2008  . DIABETES MELLITUS, TYPE I 02/23/2007  . PERIPHERAL NEUROPATHY 02/23/2007  . GERD 02/23/2007  . OSTEOARTHRITIS 02/23/2007  . OSTEOPOROSIS 02/23/2007    Past Medical History:  Past Medical History  Diagnosis Date  . HYPERCHOLESTEROLEMIA 02/01/2008  . ANXIETY 03/08/2008  . PERIPHERAL NEUROPATHY 02/23/2007  . HYPERTENSION 03/08/2008  . GERD 02/23/2007  . DIVERTICULOSIS, COLON 03/08/2008  . OSTEOARTHRITIS 02/23/2007  . OSTEOPOROSIS 02/23/2007  . Urolithiasis   . Allergy     SEASONAL  . Cataract     REMOVED BILATERAL  . DIABETES MELLITUS, TYPE I 02/23/2007  . Depression    Past Surgical History:  Past Surgical History   Procedure Laterality Date  . Cholecystectomy  1995  . Esophagogastroduodenoscopy  06/08/2002  . Gastrectomy N/A 07/20/2014    Procedure: PROXIMAL GASTRECTOMY;  Surgeon: Stark Klein, MD;  Location: [place];  Service: General;  Laterality: N/A;  . Laparoscopy N/A 07/20/2014    Procedure: LAPAROSCOPY DIAGNOSTIC;  Surgeon: Stark Klein, MD;  Location: [place];  Service: General;  Laterality: N/A;  . Gastrojejunostomy N/A 07/20/2014    Procedure: GASTROJEJUNOSTOMY;  Surgeon: Stark Klein, MD;  Location: [facility];  Service: General;  Laterality: N/A;    Assessment & Plan Clinical Impression: Patient is a 81 y.o. right handed female with recent diagnosis of new gastric cancer followed by gastroenterology Dr. Deatra Ina that was incidentally discovered upon endoscopy to follow-up for esophageal stricture. Biopsies positive for adenocarcinoma. Patient independent prior to admission living with her husband. Admitted 07/20/2014 underwent proximal gastrectomy and feeding jejunostomy per Dr. Barry Dienes. Hospital course pain management. On 07/24/2014 with 18 beats of NSVT. EKG showed normal sinus rhythm with borderline LVH. There was no chest pain or shortness of breath. Cardiology service is consulted. Troponin mildly elevated 0.60. Echocardiogram with ejection fraction of 60% without PFO motion abnormalities.. Placed on low-dose metoprolol. Tube feeds have been initiated and cycling of tube feeds and she remains nothing by mouth other than ice chips. Bouts of confusion and restlessness at night. Physical therapy evaluation completed 07/22/2014 recommendations CIR consult.  Patient transferred to CIR on 07/28/2014 .    Patient currently requires mod with basic self-care skills secondary to muscle weakness and decreased standing balance and decreased balance strategies and dizziness with mobility.  Prior to hospitalization, patient could complete ADLs with independent .  Patient will benefit from skilled intervention to increase  independence with basic self-care skills and increase level of independence with iADL prior to discharge home with care partner.  Anticipate patient will require intermittent supervision and TBD.  OT - End of Session Activity Tolerance: Tolerates 30+ min activity with multiple rests Endurance Deficit: Yes Endurance Deficit Description: requires rest breaks, fatigued and falling asleep at end of OT session OT Assessment Rehab Potential (ACUTE ONLY): Good OT Patient demonstrates impairments in the following area(s): Balance;Endurance;Motor;Safety OT Basic ADL's Functional Problem(s): Grooming;Bathing;Dressing;Toileting OT Advanced ADL's Functional Problem(s): Simple Meal Preparation;Light Housekeeping OT Transfers Functional Problem(s): Toilet;Tub/Shower OT Additional Impairment(s): None OT Plan OT Intensity: Minimum of 1-2 x/day, 45 to 90 minutes OT Frequency: 5 out of 7 days OT Duration/Estimated Length of Stay: 12-14 days OT Treatment/Interventions: Balance/vestibular training;Discharge planning;Disease Lawyer;Functional mobility training;Neuromuscular re-education;Pain management;Patient/family education;Psychosocial support;Self Care/advanced ADL retraining;Skin care/wound managment;Therapeutic Activities;Therapeutic Exercise;UE/LE Coordination activities OT Basic Self-Care Anticipated Outcome(s): Supervision OT Toileting Anticipated Outcome(s): Supervision OT Bathroom Transfers Anticipated Outcome(s): Supervision OT Recommendation Patient destination: Home Follow Up Recommendations:  (TBD)   Skilled Therapeutic Intervention OT eval with education regarding rehab process, OT purpose, goals, and ELOS.  Engaged in ADL assessment at sit > stand level from EOB with min/steady assist when standing.  Pt required increased time and rest breaks due to deconditioning.  Ambulated with min assist to bathroom, completed toilet transfer and toileting  with min assist.  Educated on hand placement during transfers and ambulation to decrease "furniture walking".  Pt returned to bed after toileting and requested to lie down and rest before next session.    OT Evaluation Precautions/Restrictions  Precautions Precautions: Fall Precaution Comments: jejunostomy tube, gastrostomy Restrictions Weight Bearing Restrictions: No General   Vital Signs Therapy Vitals Temp: 98.2 F (36.8 C) Temp Source: Oral Pulse Rate: 73 Resp: 16 BP: (!) 152/58 mmHg Patient Position (if appropriate): Lying Oxygen Therapy SpO2: 96 % Pain Pain Assessment Pain Score: Asleep Pain Type: Acute pain Pain Location: Head Pain Descriptors / Indicators: Aching Pain Intervention(s): Medication (See eMAR) Home Living/Prior Functioning Home Living Living Arrangements: Spouse/significant other Available Help at Discharge: Family, Available 24 hours/day (2 daughters to alternate assistance) Type of Home: House Home Access: Other (comment) (home totally w/c accessible) Home Layout: One level Additional Comments: Pt from [facility] but PCP is Loanne Drilling in [place] and all children live in [place]  Lives With: Spouse IADL History Homemaking Responsibilities: Yes Meal Prep Responsibility: Primary Laundry Responsibility: Primary Cleaning Responsibility: Primary Shopping Responsibility: Primary Prior Function Level of Independence: Independent with basic ADLs, Independent with homemaking with ambulation, Independent with gait, Independent with transfers  Able to Take Stairs?: Yes Driving: Yes Vocation: Part time employment Vocation Requirements: Did house cleaning once per week Leisure: Hobbies-yes (Comment) Comments: Pt enjoys participating in church activities. ADL  See FIM Vision/Perception  Vision- History Baseline Vision/History: Wears glasses Wears Glasses: At all times Patient Visual Report: No change from baseline Vision- Assessment Vision Assessment?:  Yes Eye Alignment: Within Functional Limits Ocular Range of Motion: Within Functional Limits Alignment/Gaze Preference: Within Defined Limits Tracking/Visual Pursuits: Able to track stimulus in all quads without difficulty  Cognition Overall Cognitive Status: Within Functional Limits for tasks assessed Arousal/Alertness: Awake/alert Orientation Level: Oriented to person;Oriented to place;Oriented to situation Memory: Appears intact Awareness: Appears intact Problem Solving: Appears intact Safety/Judgment: Appears intact Sensation Sensation Light Touch: Appears Intact Proprioception: Impaired by gross assessment Additional Comments: Proprioception grossly intact, with exception of some discrepancy in bilat feet. Coordination  Fine Motor Movements are Fluid and Coordinated: No (slow rate) Finger Nose Finger Test: grossly intact, somewhat slow Extremity/Trunk Assessment RUE Assessment RUE Assessment: Within Functional Limits (strength grossly 4/5) LUE Assessment LUE Assessment: Within Functional Limits (strength grossly 4/5)  FIM:  FIM - Grooming Grooming Steps: Wash, rinse, dry face;Wash, rinse, dry hands Grooming: 3: Patient completes 2 of 4 or 3 of 5 steps FIM - Bathing Bathing Steps Patient Completed: Chest;Right Arm;Left Arm;Front perineal area;Buttocks;Right upper leg;Left upper leg Bathing: 3: Mod-Patient completes 5-7 20f10 parts or 50-74% FIM - Upper Body Dressing/Undressing Upper body dressing/undressing steps patient completed: Thread/unthread right sleeve of pullover shirt/dresss;Thread/unthread left sleeve of pullover shirt/dress;Put head through opening of pull over shirt/dress;Pull shirt over trunk Upper body dressing/undressing: 3: Mod-Patient completed 50-74% of tasks FIM - Lower Body Dressing/Undressing Lower body dressing/undressing steps patient completed: Thread/unthread right underwear leg;Thread/unthread left underwear leg;Pull underwear  up/down;Thread/unthread right pants leg;Thread/unthread left pants leg;Pull pants up/down;Don/Doff right sock;Don/Doff left sock;Don/Doff right shoe;Don/Doff left shoe Lower body dressing/undressing: 4: Min-Patient completed 75 plus % of tasks FIM - Toileting Toileting steps completed by patient: Adjust clothing prior to toileting;Performs perineal hygiene;Adjust clothing after toileting Toileting Assistive Devices: Grab bar or rail for support Toileting: 4: Steadying assist FIM - Bed/Chair Transfer Bed/Chair Transfer: 3: Supine > Sit: Mod A (lifting assist/Pt. 50-74%/lift 2 legs;4: Bed > Chair or W/C: Min A (steadying Pt. > 75%);4: Chair or W/C > Bed: Min A (steadying Pt. > 75%) FIM - TRadio producerDevices: Grab bars Toilet Transfers: 4-To toilet/BSC: Min A (steadying Pt. > 75%);4-From toilet/BSC: Min A (steadying Pt. > 75%) FIM - Tub/Shower Transfers Tub/shower Transfers: 0-Activity did not occur or was simulated   Refer to Care Plan for Long Term Goals  Recommendations for other services: None  Discharge Criteria: Patient will be discharged from OT if patient refuses treatment 3 consecutive times without medical reason, if treatment goals not met, if there is a change in medical status, if patient makes no progress towards goals or if patient is discharged from hospital.  The above assessment, treatment plan, treatment alternatives and goals were discussed and mutually agreed upon: by patient and by family  HEllwood DenseSWoodridge Behavioral Center1/15/2016, 3:13 PM

## 2014-07-30 ENCOUNTER — Inpatient Hospital Stay (HOSPITAL_COMMUNITY): Payer: Medicare Other | Admitting: Occupational Therapy

## 2014-07-30 ENCOUNTER — Inpatient Hospital Stay (HOSPITAL_COMMUNITY): Payer: Medicare Other | Admitting: *Deleted

## 2014-07-30 DIAGNOSIS — J189 Pneumonia, unspecified organism: Secondary | ICD-10-CM | POA: Diagnosis present

## 2014-07-30 LAB — GLUCOSE, CAPILLARY
GLUCOSE-CAPILLARY: 276 mg/dL — AB (ref 70–99)
GLUCOSE-CAPILLARY: 188 mg/dL — AB (ref 70–99)
Glucose-Capillary: 181 mg/dL — ABNORMAL HIGH (ref 70–99)
Glucose-Capillary: 188 mg/dL — ABNORMAL HIGH (ref 70–99)
Glucose-Capillary: 226 mg/dL — ABNORMAL HIGH (ref 70–99)
Glucose-Capillary: 235 mg/dL — ABNORMAL HIGH (ref 70–99)

## 2014-07-30 LAB — URINALYSIS, ROUTINE W REFLEX MICROSCOPIC
Bilirubin Urine: NEGATIVE
Glucose, UA: 250 mg/dL — AB
HGB URINE DIPSTICK: NEGATIVE
KETONES UR: NEGATIVE mg/dL
Nitrite: NEGATIVE
Protein, ur: NEGATIVE mg/dL
SPECIFIC GRAVITY, URINE: 1.016 (ref 1.005–1.030)
Urobilinogen, UA: 1 mg/dL (ref 0.0–1.0)
pH: 7 (ref 5.0–8.0)

## 2014-07-30 LAB — CBC
HEMATOCRIT: 29.2 % — AB (ref 36.0–46.0)
HEMOGLOBIN: 9.6 g/dL — AB (ref 12.0–15.0)
MCH: 28.5 pg (ref 26.0–34.0)
MCHC: 32.9 g/dL (ref 30.0–36.0)
MCV: 86.6 fL (ref 78.0–100.0)
PLATELETS: 364 10*3/uL (ref 150–400)
RBC: 3.37 MIL/uL — AB (ref 3.87–5.11)
RDW: 13.9 % (ref 11.5–15.5)
WBC: 17.6 10*3/uL — ABNORMAL HIGH (ref 4.0–10.5)

## 2014-07-30 LAB — URINE MICROSCOPIC-ADD ON

## 2014-07-30 MED ORDER — VANCOMYCIN HCL IN DEXTROSE 1-5 GM/200ML-% IV SOLN
1000.0000 mg | Freq: Once | INTRAVENOUS | Status: AC
  Administered 2014-07-30: 1000 mg via INTRAVENOUS
  Filled 2014-07-30: qty 200

## 2014-07-30 MED ORDER — VANCOMYCIN HCL IN DEXTROSE 750-5 MG/150ML-% IV SOLN
750.0000 mg | Freq: Two times a day (BID) | INTRAVENOUS | Status: DC
  Administered 2014-07-31 – 2014-08-04 (×9): 750 mg via INTRAVENOUS
  Filled 2014-07-30 (×11): qty 150

## 2014-07-30 MED ORDER — PIPERACILLIN-TAZOBACTAM 3.375 G IVPB
3.3750 g | Freq: Three times a day (TID) | INTRAVENOUS | Status: DC
  Administered 2014-07-30 – 2014-08-04 (×13): 3.375 g via INTRAVENOUS
  Filled 2014-07-30 (×17): qty 50

## 2014-07-30 MED ORDER — PIPERACILLIN-TAZOBACTAM 3.375 G IVPB
3.3750 g | Freq: Three times a day (TID) | INTRAVENOUS | Status: DC
  Administered 2014-07-30: 3.375 g via INTRAVENOUS
  Filled 2014-07-30 (×2): qty 50

## 2014-07-30 NOTE — IPOC Note (Signed)
Overall Plan of Care [facility]) Patient Details Name: Tammy Hughes MRN: [id number] DOB: February 26, 1933  Admitting Diagnosis: [place] Hospital Problems: Principal Problem:   Debility Active Problems:   Gastric cancer-s/p gastrectomy 07/20/14   HCAP (healthcare-associated pneumonia)     Functional Problem List: Nursing Endurance, Medication Management, Nutrition, Pain, Skin Integrity  PT Balance, Skin Integrity, Endurance, Motor, Safety, Sensory  OT Balance, Endurance, Motor, Safety  SLP    TR         Basic ADL's: OT Grooming, Bathing, Dressing, Toileting     Advanced  ADL's: OT Simple Meal Preparation, Light Housekeeping     Transfers: PT Bed Mobility, Bed to Chair, Musician, Manufacturing systems engineer, Metallurgist: PT Ambulation, Emergency planning/management officer, Stairs     Additional Impairments: OT None  SLP        TR      Anticipated Outcomes Item Anticipated Outcome  Self Feeding    Set designer Transfers Supervision  Bowel/Bladder  manage bowel and bladder mod I  Transfers  Supervision  Locomotion  Supervision  Communication     Cognition     Pain  4 or less out of 10  Safety/Judgment  Mod I   Therapy Plan: PT Intensity: Minimum of 1-2 x/day ,45 to 90 minutes PT Frequency: 5 out of 7 days PT Duration Estimated Length of Stay: 12-14 days OT Intensity: Minimum of 1-2 x/day, 45 to 90 minutes OT Frequency: 5 out of 7 days OT Duration/Estimated Length of Stay: 12-14 days         Team Interventions: Nursing Interventions Patient/Family Education, Pain Management, Medication Management, Skin Care/Wound Management  PT interventions Ambulation/gait training, Balance/vestibular training, Cognitive remediation/compensation, Discharge planning, DME/adaptive equipment instruction, Functional mobility training, Patient/family education, Neuromuscular re-education, Skin  care/wound management, Splinting/orthotics, Therapeutic Exercise, Therapeutic Activities, Stair training, UE/LE Strength taining/ROM, UE/LE Coordination activities, Wheelchair propulsion/positioning  OT Interventions Balance/vestibular training, Discharge planning, Disease mangement/prevention, DME/adaptive equipment instruction, Functional mobility training, Neuromuscular re-education, Pain management, Patient/family education, Psychosocial support, Self Care/advanced ADL retraining, Skin care/wound managment, Therapeutic Activities, Therapeutic Exercise, UE/LE Coordination activities  SLP Interventions    TR Interventions    SW/CM Interventions Discharge Planning, Psychosocial Support, Patient/Family Education    Team Discharge Planning: Destination: PT-Home ,OT- Home , SLP-  Projected Follow-up: PT-Home health PT, Outpatient PT, 24 hour supervision/assistance (HHPT vs OPPT (TBD)), OT-   (TBD), SLP-  Projected Equipment Needs: PT-Standard walker, OT-  , SLP-  Equipment Details: PT-Pt will likely need youth rolling walker., OT-  Patient/family involved in discharge planning: PT- Patient, Family member/caregiver,  OT-Patient, Family member/caregiver, SLP-   MD ELOS: 12-14 days Medical Rehab Prognosis:  Excellent Assessment: The patient has been admitted for CIR therapies with the diagnosis of deconditioning after gastric surgery/cancer. The team will be addressing functional mobility, strength, stamina, balance, safety, adaptive techniques and equipment, self-care, bowel and bladder mgt, patient and caregiver education, wound care, pain mgt, ego-support, community reintegration. Goals have been set at supervision for mobility and self-care.    Meredith Staggers, MD, FAAPMR      See Team Conference Notes for weekly updates to the plan of care

## 2014-07-30 NOTE — Progress Notes (Signed)
Patient ID: Tammy Hughes, female   DOB: [birth year], 81 y.o.   MRN: [id number]  07/30/14.  81 y.o. right handed female with recent diagnosis of new gastric cancer followed by gastroenterology Dr. Deatra Ina that was incidentally discovered upon endoscopy to follow-up for esophageal stricture. Biopsies positive for adenocarcinoma. Patient independent prior to admission living with her husband. Admitted 07/20/2014 underwent proximal gastrectomy and feeding jejunostomy per Dr. Barry Dienes.   Over the past 24 hours, has become more febrile and last night became mush more confused. WBC has increased from 10.6 to 17.6.   CXR c/w bibasilar pneumonia.  Feels better this am and back to baseline  Past Medical History  Diagnosis Date  . HYPERCHOLESTEROLEMIA 02/01/2008  . ANXIETY 03/08/2008  . PERIPHERAL NEUROPATHY 02/23/2007  . HYPERTENSION 03/08/2008  . GERD 02/23/2007  . DIVERTICULOSIS, COLON 03/08/2008  . OSTEOARTHRITIS 02/23/2007  . OSTEOPOROSIS 02/23/2007  . Urolithiasis   . Allergy     SEASONAL  . Cataract     REMOVED BILATERAL  . DIABETES MELLITUS, TYPE I 02/23/2007  . Depression    Patient Vitals for the past 24 hrs:  BP Temp Temp src Pulse Resp SpO2  07/30/14 0813 (!) 148/64 mmHg - - 72 - -  07/30/14 0500 (!) 136/58 mmHg - - - - -  07/30/14 0454 (!) 148/48 mmHg 98.9 F (37.2 C) Oral 72 16 92 %  07/30/14 0015 - 98.3 F (36.8 C) Oral - - -  07/29/14 2140 (!) 139/58 mmHg (!) 101.1 F (38.4 C) Oral 74 20 95 %  07/29/14 2054 (!) 144/62 mmHg - - 66 - -  07/29/14 1435 (!) 152/58 mmHg 98.2 F (36.8 C) Oral 73 16 96 %     Intake/Output Summary (Last 24 hours) at 07/30/14 0948 Last data filed at 07/30/14 0900  Gross per 24 hour  Intake    240 ml  Output    350 ml  Net   -110 ml       Objective: Vital Signs: Blood pressure 148/64, pulse 72, temperature 98.9 F (37.2 C), temperature source Oral, resp. rate 16, height 5' (1.524 m), weight 70.126 kg (154 lb 9.6 oz), SpO2 92 %. Dg Chest Port 1  View  07/29/2014   CLINICAL DATA:  Acute onset of shortness of breath. Fever. Initial encounter.  EXAM: PORTABLE CHEST - 1 VIEW  COMPARISON:  Chest radiograph from 08/06/2013, and CT of the chest performed 06/08/2014  FINDINGS: The lungs are hypoexpanded. Left basilar airspace opacity raises concern for pneumonia, given the patient's symptoms. Minimal right basilar airspace opacity is also seen. No definite pleural effusion or pneumothorax is seen.  The cardiomediastinal silhouette is borderline normal in size. No acute osseous abnormalities are identified.  IMPRESSION: Lungs hypoexpanded. Left basilar airspace opacity raises concern for pneumonia, given the patient's symptoms. Minimal right basilar airspace opacity also seen.   Electronically Signed   By: Garald Balding M.D.   On: 07/29/2014 23:25   Results for orders placed or performed during the hospital encounter of 07/28/14 (from the past 72 hour(s))  Glucose, capillary     Status: Abnormal   Collection Time: 07/28/14  4:34 PM  Result Value Ref Range   Glucose-Capillary 147 (H) 70 - 99 mg/dL  Glucose, capillary     Status: Abnormal   Collection Time: 07/28/14  8:39 PM  Result Value Ref Range   Glucose-Capillary 271 (H) 70 - 99 mg/dL  Glucose, capillary     Status: Abnormal   Collection Time: 07/28/14  11:48 PM  Result Value Ref Range   Glucose-Capillary 276 (H) 70 - 99 mg/dL  CBC     Status: Abnormal   Collection Time: 07/29/14 12:10 AM  Result Value Ref Range   WBC 17.6 (H) 4.0 - 10.5 K/uL   RBC 3.37 (L) 3.87 - 5.11 MIL/uL   Hemoglobin 9.6 (L) 12.0 - 15.0 g/dL   HCT 29.2 (L) 36.0 - 46.0 %   MCV 86.6 78.0 - 100.0 fL   MCH 28.5 26.0 - 34.0 pg   MCHC 32.9 30.0 - 36.0 g/dL   RDW 13.9 11.5 - 15.5 %   Platelets 364 150 - 400 K/uL  Glucose, capillary     Status: Abnormal   Collection Time: 07/29/14  3:52 AM  Result Value Ref Range   Glucose-Capillary 223 (H) 70 - 99 mg/dL  CBC WITH DIFFERENTIAL     Status: Abnormal   Collection Time:  07/29/14  7:35 AM  Result Value Ref Range   WBC 15.9 (H) 4.0 - 10.5 K/uL   RBC 3.87 3.87 - 5.11 MIL/uL   Hemoglobin 11.1 (L) 12.0 - 15.0 g/dL   HCT 33.8 (L) 36.0 - 46.0 %   MCV 87.3 78.0 - 100.0 fL   MCH 28.7 26.0 - 34.0 pg   MCHC 32.8 30.0 - 36.0 g/dL   RDW 14.1 11.5 - 15.5 %   Platelets 396 150 - 400 K/uL   Neutrophils Relative % 80 (H) 43 - 77 %   Neutro Abs 12.6 (H) 1.7 - 7.7 K/uL   Lymphocytes Relative 13 12 - 46 %   Lymphs Abs 2.1 0.7 - 4.0 K/uL   Monocytes Relative 6 3 - 12 %   Monocytes Absolute 1.0 0.1 - 1.0 K/uL   Eosinophils Relative 1 0 - 5 %   Eosinophils Absolute 0.2 0.0 - 0.7 K/uL   Basophils Relative 0 0 - 1 %   Basophils Absolute 0.0 0.0 - 0.1 K/uL  Comprehensive metabolic panel     Status: Abnormal   Collection Time: 07/29/14  7:35 AM  Result Value Ref Range   Sodium 139 135 - 145 mmol/L    Comment: Please note change in reference range.   Potassium 4.2 3.5 - 5.1 mmol/L    Comment: Please note change in reference range.   Chloride 100 96 - 112 mEq/L   CO2 28 19 - 32 mmol/L   Glucose, Bld 241 (H) 70 - 99 mg/dL   BUN 16 6 - 23 mg/dL   Creatinine, Ser 0.83 0.50 - 1.10 mg/dL   Calcium 9.5 8.4 - 10.5 mg/dL   Total Protein 7.2 6.0 - 8.3 g/dL   Albumin 2.3 (L) 3.5 - 5.2 g/dL   AST 48 (H) 0 - 37 U/L   ALT 105 (H) 0 - 35 U/L   Alkaline Phosphatase 161 (H) 39 - 117 U/L   Total Bilirubin 0.3 0.3 - 1.2 mg/dL   GFR calc non Af Amer 65 (L) >90 mL/min   GFR calc Af Amer 76 (L) >90 mL/min    Comment: (NOTE) The eGFR has been calculated using the CKD EPI equation. This calculation has not been validated in all clinical situations. eGFR's persistently <90 mL/min signify possible Chronic Kidney Disease.    Anion gap 11 5 - 15  Glucose, capillary     Status: Abnormal   Collection Time: 07/29/14  8:23 AM  Result Value Ref Range   Glucose-Capillary 214 (H) 70 - 99 mg/dL  Glucose, capillary  Status: Abnormal   Collection Time: 07/29/14 11:50 AM  Result Value Ref  Range   Glucose-Capillary 212 (H) 70 - 99 mg/dL   Comment 1 Notify RN   Glucose, capillary     Status: Abnormal   Collection Time: 07/29/14  4:57 PM  Result Value Ref Range   Glucose-Capillary 185 (H) 70 - 99 mg/dL   Comment 1 Notify RN   Glucose, capillary     Status: Abnormal   Collection Time: 07/29/14  8:07 PM  Result Value Ref Range   Glucose-Capillary 221 (H) 70 - 99 mg/dL  Urinalysis, Routine w reflex microscopic     Status: Abnormal   Collection Time: 07/29/14 11:17 PM  Result Value Ref Range   Color, Urine YELLOW YELLOW   APPearance CLOUDY (A) CLEAR   Specific Gravity, Urine 1.016 1.005 - 1.030   pH 7.0 5.0 - 8.0   Glucose, UA 250 (A) NEGATIVE mg/dL   Hgb urine dipstick NEGATIVE NEGATIVE   Bilirubin Urine NEGATIVE NEGATIVE   Ketones, ur NEGATIVE NEGATIVE mg/dL   Protein, ur NEGATIVE NEGATIVE mg/dL   Urobilinogen, UA 1.0 0.0 - 1.0 mg/dL   Nitrite NEGATIVE NEGATIVE   Leukocytes, UA SMALL (A) NEGATIVE  Urine microscopic-add on     Status: Abnormal   Collection Time: 07/29/14 11:17 PM  Result Value Ref Range   Squamous Epithelial / LPF RARE RARE   WBC, UA 7-10 <3 WBC/hpf   Bacteria, UA MANY (A) RARE  Glucose, capillary     Status: Abnormal   Collection Time: 07/30/14 12:16 AM  Result Value Ref Range   Glucose-Capillary 226 (H) 70 - 99 mg/dL  Glucose, capillary     Status: Abnormal   Collection Time: 07/30/14  4:51 AM  Result Value Ref Range   Glucose-Capillary 276 (H) 70 - 99 mg/dL   Comment 1 Notify RN   Glucose, capillary     Status: Abnormal   Collection Time: 07/30/14  8:08 AM  Result Value Ref Range   Glucose-Capillary 235 (H) 70 - 99 mg/dL   Comment 1 Documented in Chart     General- alert and appropriate; no distress HEENT- negative Chest- decrease BS both bases, more prominebt on the R; bibasilar rales L>R CV- no tachy Abd-  Midline incision clean; s/p PEG; soft and non tender Extr- neg   Medical Problem List and Plan: 1. Functional deficits  secondary to severe debilitation related to gastric cancer and subsequent gastrectomy with jejunostomy tube 07/20/2014 2. DVT Prophylaxis/Anticoagulation: SCDs. Monitor for any signs of DVT  3. Skin/Wound Care: Routine skin checks of surgical sites as well as routine turning to maintain skin  4.  Asymptomatic VT. follow-up cardiology services. Lopressor 25 mg twice a day, Lasix 20 mg daily. Cardiac rate controlled 5. Diabetes mellitus with peripheral neuropathy. Latest hemoglobin A1c 8.1. Lantus insulin 35units daily at bedtime. Check blood sugars before meals and at bedtime. Monitor closely while on tube feeds 6. Hyperlipidemia. Crestor 7. Decreased nutritional storage. Continue nutritional support via jejunostomy tube. Dietary follow-up for cycling of tube feeds. Patient is to remain nothing by mouth other than ice chips 8.  HCAP- start Zosyn and Vanco-pharmacy to dose  LOS (Days) 2 A FACE TO FACE EVALUATION WAS PERFORMED  Nyoka Cowden 07/30/2014, 9:44 AM

## 2014-07-30 NOTE — Progress Notes (Signed)
Physical Therapy Session Note  Patient Details  Name: Tammy Hughes MRN: [id number] Date of Birth: 06/08/1933  Today's Date: 07/30/2014    Skilled Therapeutic Interventions/Progress Updates:   Session I 1100-1200 (60 min ) Patient in bed at the beginning of the session, agrees to therapy . Bed mobility with min A for scooting up  And min A for coming supine to sit and sit to supine. Transfer sit to stand with min A and VC for weight shift forward.  TE" LAQ 2 x 10, hamstring curls 2 x10, hip abd 2 x10 -all exercises against manual resistance. Gait training; 2x70 feet 1 x 50 feet with RW and SBA ,VC for posture and step length.  Static standing training to increase activity tolerance 1 x 2 min. Bathroom transfers with min A for transfer and don/doffing LB dressing.  Patient returned to room and transferred to bed ,all needs within reach.  Patient family was present during session.   Session II had to be cancelled as patient is very fatigued and not able to participate, family has been present and asked to cancel the session as they want mom to rest. Missed 30 min of therapy.  Therapy Documentation Precautions:  Precautions Precautions: Fall Precaution Comments: jejunostomy tube, gastrostomy Restrictions Weight Bearing Restrictions: No Vital Signs: Therapy Vitals Pulse Rate: 72 BP: (!) 148/64 mmHg  See FIM for current functional status  Therapy/Group: Individual Therapy  [name] 07/30/2014, 12:00 PM

## 2014-07-30 NOTE — Progress Notes (Signed)
ANTIBIOTIC CONSULT NOTE - INITIAL  Pharmacy Consult:  Vancomycin / Zosyn Indication:  PNA  Allergies  Allergen Reactions  . Pirfenidone Diarrhea and Nausea And Vomiting  . Aspirin     rash    Patient Measurements: Height: 5' (152.4 cm) Weight: 154 lb 9.6 oz (70.126 kg) IBW/kg (Calculated) : 45.5  Vital Signs: Temp: 98.9 F (37.2 C) (01/16 0454) Temp Source: Oral (01/16 0454) BP: 148/64 mmHg (01/16 0813) Pulse Rate: 72 (01/16 0813) Intake/Output from previous day: 01/15 0701 - 01/16 0700 In: 360 [NG/GT:360] Out: -  Intake/Output from this shift: Total I/O In: -  Out: 350 [Urine:350]  Labs:  Recent Labs  07/29/14 0010 07/29/14 0735  WBC 17.6* 15.9*  HGB 9.6* 11.1*  PLT 364 396  CREATININE  --  0.83   Estimated Creatinine Clearance: 48 mL/min (by C-G formula based on Cr of 0.83). No results for input(s): VANCOTROUGH, VANCOPEAK, VANCORANDOM, GENTTROUGH, GENTPEAK, GENTRANDOM, TOBRATROUGH, TOBRAPEAK, TOBRARND, AMIKACINPEAK, AMIKACINTROU, AMIKACIN in the last 72 hours.   Microbiology: Recent Results (from the past 720 hour(s))  MRSA PCR Screening     Status: None   Collection Time: 07/21/14  5:27 AM  Result Value Ref Range Status   MRSA by PCR NEGATIVE NEGATIVE Final    Comment:        The GeneXpert MRSA Assay (FDA approved for NASAL specimens only), is one component of a comprehensive MRSA colonization surveillance program. It is not intended to diagnose MRSA infection nor to guide or monitor treatment for MRSA infections.     Medical History: Past Medical History  Diagnosis Date  . HYPERCHOLESTEROLEMIA 02/01/2008  . ANXIETY 03/08/2008  . PERIPHERAL NEUROPATHY 02/23/2007  . HYPERTENSION 03/08/2008  . GERD 02/23/2007  . DIVERTICULOSIS, COLON 03/08/2008  . OSTEOARTHRITIS 02/23/2007  . OSTEOPOROSIS 02/23/2007  . Urolithiasis   . Allergy     SEASONAL  . Cataract     REMOVED BILATERAL  . DIABETES MELLITUS, TYPE I 02/23/2007  . Depression        Assessment: 63 YOF with HCAP to start vancomycin and Zosyn.  Baseline labs reviewed.  Vanc 1/16 >> Zosyn 1/16 >>  1/16 BCx x2 -   Goal of Therapy:  Vancomycin trough level 15-20 mcg/ml   Plan:  - Vanc 1gm IV x 1, then 750mg  IV Q12H - Zosyn 3.375gm IV Q8H, 4 hr infusion - Monitor renal fxn, clinical progress, vanc trough as indicated - BMET on Monday   Zaydin Billey D. Mina Marble, PharmD, BCPS Pager:  [phone] 07/30/2014, 10:35 AM

## 2014-07-30 NOTE — Progress Notes (Signed)
Patient given PRN tylenol for 101.1 temp and ativan for anxiety.  Temp rechecked: 98.3 after tylenol.  Patient's anxiety decreased with comfort of many family members present and ativan.  Patient able to fall asleep after some family members left. Will continue to monitor.

## 2014-07-30 NOTE — Progress Notes (Signed)
Occupational Therapy Session Note  Patient Details  Name: Tammy Hughes MRN: [id number] Date of Birth: [birth year]  Today's Date: 07/30/2014 OT Individual Time: 9147-8295 OT Individual Time Calculation (min): 60 min    Short Term Goals: Week 1:  OT Short Term Goal 1 (Week 1): Pt will complete bathing at shower level with min assist OT Short Term Goal 2 (Week 1): Pt will complete UB dressing (bra included) with supervision OT Short Term Goal 3 (Week 1): Pt will complete LB dressing (including don and tie shoes) with min assist OT Short Term Goal 4 (Week 1): Pt will complete shower transfer with min assist with LRAD OT Short Term Goal 5 (Week 1): Pt will complete toilet transfer with supervision with LRAD  Skilled Therapeutic Interventions/Progress Updates:    Engaged in ADL retraining with focus on activity tolerance, transfers, and increased participation in self-care tasks.  Pt asleep upon arrival but easily aroused.  Min assist bed mobility and stand pivot to w/c this session.  Bathing and dressing completed at sit > stand level at sink with pt demonstrating difficulty threading RLE through underwear and pants leg.  Pt required frequent rest breaks.  Stand pivot to [facility] due to urgency with need to urinate.  Pt able to complete toileting tasks with min/steady assist for balance.  Pt returned to bed stand pivot with min assist, requesting to rest until next session.   Therapy Documentation Precautions:  Precautions Precautions: Fall Precaution Comments: jejunostomy tube, gastrostomy Restrictions Weight Bearing Restrictions: No General:   Vital Signs: Therapy Vitals Pulse Rate: 72 BP: (!) 148/64 mmHg Pain:  Pt with no c/o pain  See FIM for current functional status  Therapy/Group: Individual Therapy  Simonne Come 07/30/2014, 11:02 AM

## 2014-07-30 NOTE — Progress Notes (Signed)
Occupational Therapy Note  Patient Details  Name: Tammy Hughes MRN: [id number] Date of Birth: 02/26/1933  Today's Date: 07/30/2014 OT Missed Time: 22 Minutes Missed Time Reason: Other (comment);Patient fatigue (family refused)  Pt missed skilled OT treatment session secondary to family member refusing due to possible pneumonia and pt receiving IV meds.  Educated on purpose of rehab and ability to continue therapy despite meds being administered to which daughter reports she needs to rest.  Pt requested to attempt at a later time, will follow up as able.   Simonne Come 07/30/2014, 1:25 PM

## 2014-07-31 ENCOUNTER — Inpatient Hospital Stay (HOSPITAL_COMMUNITY): Payer: Medicare Other | Admitting: Physical Therapy

## 2014-07-31 LAB — GLUCOSE, CAPILLARY
GLUCOSE-CAPILLARY: 180 mg/dL — AB (ref 70–99)
Glucose-Capillary: 186 mg/dL — ABNORMAL HIGH (ref 70–99)
Glucose-Capillary: 204 mg/dL — ABNORMAL HIGH (ref 70–99)
Glucose-Capillary: 208 mg/dL — ABNORMAL HIGH (ref 70–99)
Glucose-Capillary: 209 mg/dL — ABNORMAL HIGH (ref 70–99)
Glucose-Capillary: 212 mg/dL — ABNORMAL HIGH (ref 70–99)
Glucose-Capillary: 257 mg/dL — ABNORMAL HIGH (ref 70–99)

## 2014-07-31 NOTE — Progress Notes (Signed)
Physical Therapy Session Note  Patient Details  Name: Tammy Hughes MRN: [id number] Date of Birth: 1933/04/03  Today's Date: 07/31/2014 PT Individual Time: 1300-1400 PT Individual Time Calculation (min): 60 min   Short Term Goals: Week 1:  PT Short Term Goal 1 (Week 1): Pt will perform supine<>sit with mod I with HOB flat, no rails. PT Short Term Goal 2 (Week 1): Pt will  transfer from bed<>w/c with supervision using LRAD. PT Short Term Goal 3 (Week 1): Pt will ambulate x150' using LRAD with min guard and no overt LOB. PT Short Term Goal 4 (Week 1): Pt will perform w/c mobility x150' with supervision and 25% cueing. PT Short Term Goal 5 (Week 1): Pt will negotiate standard curb with LRAD and min A.  Skilled Therapeutic Interventions/Progress Updates:  Pt was seen bedside in the pm. Pt transferred supine to edge of bed with side rail, head of bed elevated and min A. Pt transferred edge of bed to w/c with rolling walker and min A. Pt transported to rehab gym. Treatment focused on LE strengthening in standing and ambulation. Pt ambulated 50 feet x 2 with rolling walker and min A with verbal cues. Pt propelled w/c about 150 feet with B LEs and mod A. Pt left sitting up in w/c with husband at bedside.   Therapy Documentation Precautions:  Precautions Precautions: Fall Precaution Comments: jejunostomy tube, gastrostomy Restrictions Weight Bearing Restrictions: No General:   Pain: No c/o pain.    Locomotion : Ambulation Ambulation/Gait Assistance: 4: Min assist   See FIM for current functional status  Therapy/Group: Individual Therapy  Dub Amis 07/31/2014, 3:05 PM

## 2014-07-31 NOTE — Progress Notes (Signed)
Patient ID: Tammy Hughes, female   DOB: Mar 11, 1933, 81 y.o.   MRN: [id number]   Patient ID: Tammy Hughes, female   DOB: May 02, 1933, 81 y.o.   MRN: [id number]  07/31/14.  81 y.o. right handed female with recent diagnosis of new gastric cancer followed by gastroenterology Dr. Deatra Ina that was incidentally discovered upon endoscopy to follow-up for esophageal stricture. Biopsies positive for adenocarcinoma. Patient independent prior to admission living with her husband. Admitted 07/20/2014 underwent proximal gastrectomy and feeding jejunostomy per Dr. Barry Dienes.   Patient started on Zosyn/Vanco for  HCAP yesterday.  Remains afebrile.  Feels better this am and back to baseline  Past Medical History  Diagnosis Date  . HYPERCHOLESTEROLEMIA 02/01/2008  . ANXIETY 03/08/2008  . PERIPHERAL NEUROPATHY 02/23/2007  . HYPERTENSION 03/08/2008  . GERD 02/23/2007  . DIVERTICULOSIS, COLON 03/08/2008  . OSTEOARTHRITIS 02/23/2007  . OSTEOPOROSIS 02/23/2007  . Urolithiasis   . Allergy     SEASONAL  . Cataract     REMOVED BILATERAL  . DIABETES MELLITUS, TYPE I 02/23/2007  . Depression    Patient Vitals for the past 24 hrs:  BP Temp Temp src Pulse Resp SpO2  07/31/14 0415 (!) 133/56 mmHg 98.4 F (36.9 C) Oral 71 18 96 %  07/30/14 2109 (!) 114/101 mmHg - - 72 - -  07/30/14 1429 124/69 mmHg 98.9 F (37.2 C) Oral 71 18 100 %     Intake/Output Summary (Last 24 hours) at 07/31/14 0859 Last data filed at 07/30/14 0900  Gross per 24 hour  Intake      0 ml  Output    350 ml  Net   -350 ml       Objective: Vital Signs: Blood pressure 133/56, pulse 71, temperature 98.4 F (36.9 C), temperature source Oral, resp. rate 18, height 5' (1.524 m), weight 70.126 kg (154 lb 9.6 oz), SpO2 96 %. Dg Chest Port 1 View  07/29/2014   CLINICAL DATA:  Acute onset of shortness of breath. Fever. Initial encounter.  EXAM: PORTABLE CHEST - 1 VIEW  COMPARISON:  Chest radiograph from 08/06/2013, and CT of the chest performed  06/08/2014  FINDINGS: The lungs are hypoexpanded. Left basilar airspace opacity raises concern for pneumonia, given the patient's symptoms. Minimal right basilar airspace opacity is also seen. No definite pleural effusion or pneumothorax is seen.  The cardiomediastinal silhouette is borderline normal in size. No acute osseous abnormalities are identified.  IMPRESSION: Lungs hypoexpanded. Left basilar airspace opacity raises concern for pneumonia, given the patient's symptoms. Minimal right basilar airspace opacity also seen.   Electronically Signed   By: Garald Balding M.D.   On: 07/29/2014 23:25   Results for orders placed or performed during the hospital encounter of 07/28/14 (from the past 72 hour(s))  Glucose, capillary     Status: Abnormal   Collection Time: 07/28/14  4:34 PM  Result Value Ref Range   Glucose-Capillary 147 (H) 70 - 99 mg/dL  Glucose, capillary     Status: Abnormal   Collection Time: 07/28/14  8:39 PM  Result Value Ref Range   Glucose-Capillary 271 (H) 70 - 99 mg/dL  Glucose, capillary     Status: Abnormal   Collection Time: 07/28/14 11:48 PM  Result Value Ref Range   Glucose-Capillary 276 (H) 70 - 99 mg/dL  CBC     Status: Abnormal   Collection Time: 07/29/14 12:10 AM  Result Value Ref Range   WBC 17.6 (H) 4.0 - 10.5 K/uL   RBC 3.37 (  L) 3.87 - 5.11 MIL/uL   Hemoglobin 9.6 (L) 12.0 - 15.0 g/dL   HCT 29.2 (L) 36.0 - 46.0 %   MCV 86.6 78.0 - 100.0 fL   MCH 28.5 26.0 - 34.0 pg   MCHC 32.9 30.0 - 36.0 g/dL   RDW 13.9 11.5 - 15.5 %   Platelets 364 150 - 400 K/uL  Glucose, capillary     Status: Abnormal   Collection Time: 07/29/14  3:52 AM  Result Value Ref Range   Glucose-Capillary 223 (H) 70 - 99 mg/dL  CBC WITH DIFFERENTIAL     Status: Abnormal   Collection Time: 07/29/14  7:35 AM  Result Value Ref Range   WBC 15.9 (H) 4.0 - 10.5 K/uL   RBC 3.87 3.87 - 5.11 MIL/uL   Hemoglobin 11.1 (L) 12.0 - 15.0 g/dL   HCT 33.8 (L) 36.0 - 46.0 %   MCV 87.3 78.0 - 100.0 fL    MCH 28.7 26.0 - 34.0 pg   MCHC 32.8 30.0 - 36.0 g/dL   RDW 14.1 11.5 - 15.5 %   Platelets 396 150 - 400 K/uL   Neutrophils Relative % 80 (H) 43 - 77 %   Neutro Abs 12.6 (H) 1.7 - 7.7 K/uL   Lymphocytes Relative 13 12 - 46 %   Lymphs Abs 2.1 0.7 - 4.0 K/uL   Monocytes Relative 6 3 - 12 %   Monocytes Absolute 1.0 0.1 - 1.0 K/uL   Eosinophils Relative 1 0 - 5 %   Eosinophils Absolute 0.2 0.0 - 0.7 K/uL   Basophils Relative 0 0 - 1 %   Basophils Absolute 0.0 0.0 - 0.1 K/uL  Comprehensive metabolic panel     Status: Abnormal   Collection Time: 07/29/14  7:35 AM  Result Value Ref Range   Sodium 139 135 - 145 mmol/L    Comment: Please note change in reference range.   Potassium 4.2 3.5 - 5.1 mmol/L    Comment: Please note change in reference range.   Chloride 100 96 - 112 mEq/L   CO2 28 19 - 32 mmol/L   Glucose, Bld 241 (H) 70 - 99 mg/dL   BUN 16 6 - 23 mg/dL   Creatinine, Ser 0.83 0.50 - 1.10 mg/dL   Calcium 9.5 8.4 - 10.5 mg/dL   Total Protein 7.2 6.0 - 8.3 g/dL   Albumin 2.3 (L) 3.5 - 5.2 g/dL   AST 48 (H) 0 - 37 U/L   ALT 105 (H) 0 - 35 U/L   Alkaline Phosphatase 161 (H) 39 - 117 U/L   Total Bilirubin 0.3 0.3 - 1.2 mg/dL   GFR calc non Af Amer 65 (L) >90 mL/min   GFR calc Af Amer 76 (L) >90 mL/min    Comment: (NOTE) The eGFR has been calculated using the CKD EPI equation. This calculation has not been validated in all clinical situations. eGFR's persistently <90 mL/min signify possible Chronic Kidney Disease.    Anion gap 11 5 - 15  Glucose, capillary     Status: Abnormal   Collection Time: 07/29/14  8:23 AM  Result Value Ref Range   Glucose-Capillary 214 (H) 70 - 99 mg/dL  Glucose, capillary     Status: Abnormal   Collection Time: 07/29/14 11:50 AM  Result Value Ref Range   Glucose-Capillary 212 (H) 70 - 99 mg/dL   Comment 1 Notify RN   Glucose, capillary     Status: Abnormal   Collection Time: 07/29/14  4:57 PM  Result Value Ref Range   Glucose-Capillary 185 (H)  70 - 99 mg/dL   Comment 1 Notify RN   Glucose, capillary     Status: Abnormal   Collection Time: 07/29/14  8:07 PM  Result Value Ref Range   Glucose-Capillary 221 (H) 70 - 99 mg/dL  Urinalysis, Routine w reflex microscopic     Status: Abnormal   Collection Time: 07/29/14 11:17 PM  Result Value Ref Range   Color, Urine YELLOW YELLOW   APPearance CLOUDY (A) CLEAR   Specific Gravity, Urine 1.016 1.005 - 1.030   pH 7.0 5.0 - 8.0   Glucose, UA 250 (A) NEGATIVE mg/dL   Hgb urine dipstick NEGATIVE NEGATIVE   Bilirubin Urine NEGATIVE NEGATIVE   Ketones, ur NEGATIVE NEGATIVE mg/dL   Protein, ur NEGATIVE NEGATIVE mg/dL   Urobilinogen, UA 1.0 0.0 - 1.0 mg/dL   Nitrite NEGATIVE NEGATIVE   Leukocytes, UA SMALL (A) NEGATIVE  Urine microscopic-add on     Status: Abnormal   Collection Time: 07/29/14 11:17 PM  Result Value Ref Range   Squamous Epithelial / LPF RARE RARE   WBC, UA 7-10 <3 WBC/hpf   Bacteria, UA MANY (A) RARE  Glucose, capillary     Status: Abnormal   Collection Time: 07/30/14 12:16 AM  Result Value Ref Range   Glucose-Capillary 226 (H) 70 - 99 mg/dL  Glucose, capillary     Status: Abnormal   Collection Time: 07/30/14  4:51 AM  Result Value Ref Range   Glucose-Capillary 276 (H) 70 - 99 mg/dL   Comment 1 Notify RN   Glucose, capillary     Status: Abnormal   Collection Time: 07/30/14  8:08 AM  Result Value Ref Range   Glucose-Capillary 235 (H) 70 - 99 mg/dL   Comment 1 Documented in Chart   Glucose, capillary     Status: Abnormal   Collection Time: 07/30/14 11:57 AM  Result Value Ref Range   Glucose-Capillary 181 (H) 70 - 99 mg/dL  Culture, blood (routine x 2)     Status: None (Preliminary result)   Collection Time: 07/30/14 12:11 PM  Result Value Ref Range   Specimen Description BLOOD RIGHT ARM    Special Requests BOTTLES DRAWN AEROBIC AND ANAEROBIC 10CC EACH    Culture             BLOOD CULTURE RECEIVED NO GROWTH TO DATE CULTURE WILL BE HELD FOR 5 DAYS BEFORE ISSUING  A FINAL NEGATIVE REPORT Note: Culture results may be compromised due to an excessive volume of blood received in culture bottles. Performed at [facility]    Report Status PENDING   Culture, blood (routine x 2)     Status: None (Preliminary result)   Collection Time: 07/30/14 12:15 PM  Result Value Ref Range   Specimen Description BLOOD LEFT ARM    Special Requests      BOTTLES DRAWN AEROBIC AND ANAEROBIC 10CC BLUE,5CC RED   Culture             BLOOD CULTURE RECEIVED NO GROWTH TO DATE CULTURE WILL BE HELD FOR 5 DAYS BEFORE ISSUING A FINAL NEGATIVE REPORT Performed at [facility]    Report Status PENDING   Glucose, capillary     Status: Abnormal   Collection Time: 07/30/14  4:43 PM  Result Value Ref Range   Glucose-Capillary 188 (H) 70 - 99 mg/dL  Glucose, capillary     Status: Abnormal   Collection Time: 07/30/14  8:12 PM  Result  Value Ref Range   Glucose-Capillary 188 (H) 70 - 99 mg/dL  Glucose, capillary     Status: Abnormal   Collection Time: 07/30/14 11:58 PM  Result Value Ref Range   Glucose-Capillary 209 (H) 70 - 99 mg/dL  Glucose, capillary     Status: Abnormal   Collection Time: 07/31/14  4:14 AM  Result Value Ref Range   Glucose-Capillary 257 (H) 70 - 99 mg/dL  Glucose, capillary     Status: Abnormal   Collection Time: 07/31/14  8:00 AM  Result Value Ref Range   Glucose-Capillary 204 (H) 70 - 99 mg/dL    General- alert and appropriate; no distress HEENT- negative Chest- decrease BS both bases, more prominebt on the R; bibasilar rales L>R CV- no tachy Abd-  Midline incision clean; s/p PEG; soft and non tender Extr- neg   Medical Problem List and Plan: 1. Functional deficits secondary to severe debilitation related to gastric cancer and subsequent gastrectomy with jejunostomy tube 07/20/2014 2. DVT Prophylaxis/Anticoagulation: SCDs. Monitor for any signs of DVT  3. Skin/Wound Care: Routine skin checks of surgical sites as well as routine  turning to maintain skin  4.  Asymptomatic VT. follow-up cardiology services. Lopressor 25 mg twice a day, Lasix 20 mg daily. Cardiac rate controlled 5. Diabetes mellitus with peripheral neuropathy. Latest hemoglobin A1c 8.1. Lantus insulin 35units daily at bedtime. Check blood sugars before meals and at bedtime. Monitor closely while on tube feeds 6. Hyperlipidemia. Crestor 7. Decreased nutritional storage. Continue nutritional support via jejunostomy tube. Dietary follow-up for cycling of tube feeds. Patient is to remain nothing by mouth other than ice chips 8.  HCAP- Continue Zosyn and Vanco-pharmacy to dose.  Will check CBC in AM;  Consider deescalation of antibiotic therapy in  48 hrs- favor shorter course of antibiotic duration  continued nice clinical response  LOS (Days) 3 A FACE TO FACE EVALUATION WAS PERFORMED  Nyoka Cowden 07/31/2014, 8:59 AM

## 2014-08-01 ENCOUNTER — Inpatient Hospital Stay (HOSPITAL_COMMUNITY): Payer: Medicare Other | Admitting: Physical Therapy

## 2014-08-01 ENCOUNTER — Other Ambulatory Visit: Payer: Self-pay | Admitting: *Deleted

## 2014-08-01 ENCOUNTER — Encounter (HOSPITAL_COMMUNITY): Payer: Medicare Other

## 2014-08-01 ENCOUNTER — Inpatient Hospital Stay (HOSPITAL_COMMUNITY): Payer: Medicare Other | Admitting: Rehabilitation

## 2014-08-01 DIAGNOSIS — J189 Pneumonia, unspecified organism: Secondary | ICD-10-CM

## 2014-08-01 LAB — BASIC METABOLIC PANEL
Anion gap: 7 (ref 5–15)
BUN: 14 mg/dL (ref 6–23)
CO2: 26 mmol/L (ref 19–32)
CREATININE: 0.83 mg/dL (ref 0.50–1.10)
Calcium: 9 mg/dL (ref 8.4–10.5)
Chloride: 100 mEq/L (ref 96–112)
GFR calc non Af Amer: 65 mL/min — ABNORMAL LOW (ref >90)
GFR, EST AFRICAN AMERICAN: 76 mL/min — AB (ref >90)
Glucose, Bld: 256 mg/dL — ABNORMAL HIGH (ref 70–99)
Potassium: 4.6 mmol/L (ref 3.5–5.1)
Sodium: 133 mmol/L — ABNORMAL LOW (ref 135–145)

## 2014-08-01 LAB — GLUCOSE, CAPILLARY
GLUCOSE-CAPILLARY: 208 mg/dL — AB (ref 70–99)
GLUCOSE-CAPILLARY: 221 mg/dL — AB (ref 70–99)
GLUCOSE-CAPILLARY: 205 mg/dL — AB (ref 70–99)
Glucose-Capillary: 241 mg/dL — ABNORMAL HIGH (ref 70–99)
Glucose-Capillary: 236 mg/dL — ABNORMAL HIGH (ref 70–99)

## 2014-08-01 LAB — CBC
HCT: 31.3 % — ABNORMAL LOW (ref 36.0–46.0)
Hemoglobin: 10.4 g/dL — ABNORMAL LOW (ref 12.0–15.0)
MCH: 28.6 pg (ref 26.0–34.0)
MCHC: 33.2 g/dL (ref 30.0–36.0)
MCV: 86 fL (ref 78.0–100.0)
Platelets: 425 10*3/uL — ABNORMAL HIGH (ref 150–400)
RBC: 3.64 MIL/uL — ABNORMAL LOW (ref 3.87–5.11)
RDW: 13.9 % (ref 11.5–15.5)
WBC: 12.5 10*3/uL — AB (ref 4.0–10.5)

## 2014-08-01 NOTE — Progress Notes (Signed)
Occupational Therapy Note  Patient Details  Name: Tammy Hughes MRN: [id number] Date of Birth: 31-Jul-1932  Today's Date: 08/01/2014  Attempted to make-up missed therapy minutes from weekend and this AM, however pt refusing due to fatigue and pneumonia.Educated pt and husband on importance of participation in therapy, however pt and husband continued to decline. Will follow-up as able.     Ajanae Virag N 08/01/2014, 2:21 PM

## 2014-08-01 NOTE — Progress Notes (Signed)
Physical Therapy Session Note  Patient Details  Name: Tammy Hughes MRN: [id number] Date of Birth: 1932-12-14  Today's Date: 08/01/2014 PT Individual Time: 1000-1045 PT Individual Time Calculation (min): 45 min   Short Term Goals: Week 1:  PT Short Term Goal 1 (Week 1): Pt will perform supine<>sit with mod I with HOB flat, no rails. PT Short Term Goal 2 (Week 1): Pt will  transfer from bed<>w/c with supervision using LRAD. PT Short Term Goal 3 (Week 1): Pt will ambulate x150' using LRAD with min guard and no overt LOB. PT Short Term Goal 4 (Week 1): Pt will perform w/c mobility x150' with supervision and 25% cueing. PT Short Term Goal 5 (Week 1): Pt will negotiate standard curb with LRAD and min A.  Skilled Therapeutic Interventions/Progress Updates:   Pt received sitting in w/c in room, having just finished with PT session, agreeable for next session.  Daughter present during session to observe.  Assisted pt to/from therapy gym in order to work on high level balance, gait, balance strategies and activity tolerance.  Once in therapy gym, assessed BP for orthostatic hypotension as she had been c/o dizziness when up with previous PT.  Pt not orthostatic and did not have any further complaints during session.  Performed 90' gait x 1 with RW at min/guard to close S level.  Min to mod cues for hand placement and safety when sitting/standing, as well as maintaining position inside of RW throughout gait.  Following lengthy rest break due to increased fatigue, performed two bouts of ankle/hip strategy activities on the [employer] with forward/backwards and lateral weight shifts with focus on increasing forward weight shift as she tends to keep weight posteriorly.  Performed with BUE support to no UE support with therapist having to assist facilitate forward weight shift.  Again, lengthy seated rest breaks needed between tasks due to fatigue.  Ended session with obstacle course navigation for balance and proper  use of RW.  Performed approx 20' x 2 reps while negotiating around cones and stepping over poles and single higher wedge.  Mod to max verbal cues for safety and sequencing throughout.  Provided education to pt and daughter regarding need to continue to work on balance and how balance strategies work.  Both verbalized understanding.  Pt transferred back to w/c at min/guard level and was assisted back to room.  Pt left in w/c with all needs in reach and family present.    Therapy Documentation Precautions:  Precautions Precautions: Fall Precaution Comments: jejunostomy tube, gastrostomy Restrictions Weight Bearing Restrictions: No   Vital Signs: Therapy Vitals Pulse Rate: 71 BP: 140/68 mmHg Pain: Pain Assessment Pain Assessment: 0-10 Pain Score: 3  Pain Type: Acute pain Pain Location: Abdomen Pain Orientation: Mid;Lower Pain Descriptors / Indicators: Discomfort;Cramping Pain Onset: Gradual Pain Intervention(s): RN made aware  See FIM for current functional status  Therapy/Group: Individual Therapy  Denice Bors 08/01/2014, 12:36 PM

## 2014-08-01 NOTE — Progress Notes (Signed)
Occupational Therapy Session Note  Patient Details  Name: Tammy Hughes MRN: [id number] Date of Birth: 02-Dec-1932  Today's Date: 08/01/2014 OT Individual Time: 1300-1300 OT Individual Time Calculation (min): 0 min    Short Term Goals: Week 1:  OT Short Term Goal 1 (Week 1): Pt will complete bathing at shower level with min assist OT Short Term Goal 2 (Week 1): Pt will complete UB dressing (bra included) with supervision OT Short Term Goal 3 (Week 1): Pt will complete LB dressing (including don and tie shoes) with min assist OT Short Term Goal 4 (Week 1): Pt will complete shower transfer with min assist with LRAD OT Short Term Goal 5 (Week 1): Pt will complete toilet transfer with supervision with LRAD  Skilled Therapeutic Interventions/Progress Updates: Pt refused participation in planned activity (ADL, bathing/dressing) or alternative activity due to fatigue and poor thermoregulation.    Pt received supine with covers surrounding all body parts except her head/face.   Husband present in room.    Pt was alert and polite but elected to remain in bed to rest.     Therapy Documentation Precautions:  Precautions Precautions: Fall Precaution Comments: jejunostomy tube, gastrostomy Restrictions Weight Bearing Restrictions: No General OT Amount of Missed Time: 60 Minutes  Pain: Pain Assessment Pain Assessment: 0-10 Pain Score: 5  Pain Type: Acute pain Pain Location: Abdomen Pain Orientation: Mid;Lower Pain Descriptors / Indicators: Discomfort;Cramping Pain Onset: Gradual Pain Intervention(s): RN made aware   See FIM for current functional status  Therapy/Group: Individual Therapy  Alynna Hargrove 08/01/2014, 1:36 PM

## 2014-08-01 NOTE — Progress Notes (Signed)
81 y.o. right handed female with recent diagnosis of new gastric cancer followed by gastroenterology Dr. Deatra Ina that was incidentally discovered upon endoscopy to follow-up for esophageal stricture. Biopsies positive for adenocarcinoma. Patient independent prior to admission living with her husband. Admitted 07/20/2014 underwent proximal gastrectomy and feeding jejunostomy per Dr. Barry Dienes. Hospital course pain management. On 07/24/2014 with 18 beats of NSVT. EKG showed normal sinus rhythm with borderline LVH. There was no chest pain or shortness of breath. Cardiology service is consulted. Troponin mildly elevated 0.60. Echocardiogram with ejection fraction of 60% without PFO motion abnormalities..           Subjective/Complaints: Patient tolerating sips of water.feels up to trying clear liquids Reviewed weekend events, had elevated temp x 1 , thank and Zosyn started afebrile since that time most recent white count reduced from 17 K to 12 K  Review of Systems - Negative except fatigue Objective: Vital Signs: Blood pressure 140/68, pulse 71, temperature 98.3 F (36.8 C), temperature source Oral, resp. rate 18, height 5' (1.524 m), weight 70.126 kg (154 lb 9.6 oz), SpO2 100 %. No results found. Results for orders placed or performed during the hospital encounter of 07/28/14 (from the past 72 hour(s))  Glucose, capillary     Status: Abnormal   Collection Time: 07/29/14  4:57 PM  Result Value Ref Range   Glucose-Capillary 185 (H) 70 - 99 mg/dL   Comment 1 Notify RN   Glucose, capillary     Status: Abnormal   Collection Time: 07/29/14  8:07 PM  Result Value Ref Range   Glucose-Capillary 221 (H) 70 - 99 mg/dL  Urinalysis, Routine w reflex microscopic     Status: Abnormal   Collection Time: 07/29/14 11:17 PM  Result Value Ref Range   Color, Urine YELLOW YELLOW   APPearance CLOUDY (A) CLEAR   Specific Gravity, Urine 1.016 1.005 - 1.030   pH 7.0 5.0 - 8.0   Glucose, UA 250 (A) NEGATIVE mg/dL   Hgb  urine dipstick NEGATIVE NEGATIVE   Bilirubin Urine NEGATIVE NEGATIVE   Ketones, ur NEGATIVE NEGATIVE mg/dL   Protein, ur NEGATIVE NEGATIVE mg/dL   Urobilinogen, UA 1.0 0.0 - 1.0 mg/dL   Nitrite NEGATIVE NEGATIVE   Leukocytes, UA SMALL (A) NEGATIVE  Urine microscopic-add on     Status: Abnormal   Collection Time: 07/29/14 11:17 PM  Result Value Ref Range   Squamous Epithelial / LPF RARE RARE   WBC, UA 7-10 <3 WBC/hpf   Bacteria, UA MANY (A) RARE  Glucose, capillary     Status: Abnormal   Collection Time: 07/30/14 12:16 AM  Result Value Ref Range   Glucose-Capillary 226 (H) 70 - 99 mg/dL  Glucose, capillary     Status: Abnormal   Collection Time: 07/30/14  4:51 AM  Result Value Ref Range   Glucose-Capillary 276 (H) 70 - 99 mg/dL   Comment 1 Notify RN   Glucose, capillary     Status: Abnormal   Collection Time: 07/30/14  8:08 AM  Result Value Ref Range   Glucose-Capillary 235 (H) 70 - 99 mg/dL   Comment 1 Documented in Chart   Glucose, capillary     Status: Abnormal   Collection Time: 07/30/14 11:57 AM  Result Value Ref Range   Glucose-Capillary 181 (H) 70 - 99 mg/dL  Culture, blood (routine x 2)     Status: None (Preliminary result)   Collection Time: 07/30/14 12:11 PM  Result Value Ref Range   Specimen Description BLOOD RIGHT ARM  Special Requests BOTTLES DRAWN AEROBIC AND ANAEROBIC 10CC EACH    Culture             BLOOD CULTURE RECEIVED NO GROWTH TO DATE CULTURE WILL BE HELD FOR 5 DAYS BEFORE ISSUING A FINAL NEGATIVE REPORT Note: Culture results may be compromised due to an excessive volume of blood received in culture bottles. Performed at [facility]    Report Status PENDING   Culture, blood (routine x 2)     Status: None (Preliminary result)   Collection Time: 07/30/14 12:15 PM  Result Value Ref Range   Specimen Description BLOOD LEFT ARM    Special Requests      BOTTLES DRAWN AEROBIC AND ANAEROBIC 10CC BLUE,5CC RED   Culture             BLOOD CULTURE  RECEIVED NO GROWTH TO DATE CULTURE WILL BE HELD FOR 5 DAYS BEFORE ISSUING A FINAL NEGATIVE REPORT Performed at [facility]    Report Status PENDING   Glucose, capillary     Status: Abnormal   Collection Time: 07/30/14  4:43 PM  Result Value Ref Range   Glucose-Capillary 188 (H) 70 - 99 mg/dL  Glucose, capillary     Status: Abnormal   Collection Time: 07/30/14  8:12 PM  Result Value Ref Range   Glucose-Capillary 188 (H) 70 - 99 mg/dL  Glucose, capillary     Status: Abnormal   Collection Time: 07/30/14 11:58 PM  Result Value Ref Range   Glucose-Capillary 209 (H) 70 - 99 mg/dL  Glucose, capillary     Status: Abnormal   Collection Time: 07/31/14  4:14 AM  Result Value Ref Range   Glucose-Capillary 257 (H) 70 - 99 mg/dL  Glucose, capillary     Status: Abnormal   Collection Time: 07/31/14  8:00 AM  Result Value Ref Range   Glucose-Capillary 204 (H) 70 - 99 mg/dL  Glucose, capillary     Status: Abnormal   Collection Time: 07/31/14 12:06 PM  Result Value Ref Range   Glucose-Capillary 186 (H) 70 - 99 mg/dL  Glucose, capillary     Status: Abnormal   Collection Time: 07/31/14  4:26 PM  Result Value Ref Range   Glucose-Capillary 212 (H) 70 - 99 mg/dL  Glucose, capillary     Status: Abnormal   Collection Time: 07/31/14  8:55 PM  Result Value Ref Range   Glucose-Capillary 180 (H) 70 - 99 mg/dL  Glucose, capillary     Status: Abnormal   Collection Time: 07/31/14 11:52 PM  Result Value Ref Range   Glucose-Capillary 208 (H) 70 - 99 mg/dL  Glucose, capillary     Status: Abnormal   Collection Time: 08/01/14  3:49 AM  Result Value Ref Range   Glucose-Capillary 241 (H) 70 - 99 mg/dL  CBC     Status: Abnormal   Collection Time: 08/01/14  6:17 AM  Result Value Ref Range   WBC 12.5 (H) 4.0 - 10.5 K/uL   RBC 3.64 (L) 3.87 - 5.11 MIL/uL   Hemoglobin 10.4 (L) 12.0 - 15.0 g/dL   HCT 31.3 (L) 36.0 - 46.0 %   MCV 86.0 78.0 - 100.0 fL   MCH 28.6 26.0 - 34.0 pg   MCHC 33.2 30.0 - 36.0  g/dL   RDW 13.9 11.5 - 15.5 %   Platelets 425 (H) 150 - 400 K/uL  Basic metabolic panel     Status: Abnormal   Collection Time: 08/01/14  6:17 AM  Result Value  Ref Range   Sodium 133 (L) 135 - 145 mmol/L    Comment: Please note change in reference range.   Potassium 4.6 3.5 - 5.1 mmol/L    Comment: Please note change in reference range.   Chloride 100 96 - 112 mEq/L   CO2 26 19 - 32 mmol/L   Glucose, Bld 256 (H) 70 - 99 mg/dL   BUN 14 6 - 23 mg/dL   Creatinine, Ser 0.83 0.50 - 1.10 mg/dL   Calcium 9.0 8.4 - 10.5 mg/dL   GFR calc non Af Amer 65 (L) >90 mL/min   GFR calc Af Amer 76 (L) >90 mL/min    Comment: (NOTE) The eGFR has been calculated using the CKD EPI equation. This calculation has not been validated in all clinical situations. eGFR's persistently <90 mL/min signify possible Chronic Kidney Disease.    Anion gap 7 5 - 15  Glucose, capillary     Status: Abnormal   Collection Time: 08/01/14  8:10 AM  Result Value Ref Range   Glucose-Capillary 236 (H) 70 - 99 mg/dL  Glucose, capillary     Status: Abnormal   Collection Time: 08/01/14 12:21 PM  Result Value Ref Range   Glucose-Capillary 208 (H) 70 - 99 mg/dL     HEENT: normal Cardio: RRR and no murmur Resp: CTA B/L and unlabored GI: BS positive and no distension Extremity:  No Edema Skin:   Wound C/D/I Neuro: Alert/Oriented, Normal Sensory and Abnormal Motor 4/5 Bilateral UE, and LE Musc/Skel:  Normal and LB tender Gen NAD   Assessment/Plan: 1. Functional deficits secondary to severe debilitation related to gastric cancer and subsequent gastrectomy with jejunostomy tube 07/20/2014  which require 3+ hours per day of interdisciplinary therapy in a comprehensive inpatient rehab setting. Physiatrist is providing close team supervision and 24 hour management of active medical problems listed below. Physiatrist and rehab team continue to assess barriers to discharge/monitor patient progress toward functional and medical  goals. FIM: FIM - Bathing Bathing Steps Patient Completed: Chest, Right Arm, Left Arm, Front perineal area, Buttocks, Right upper leg, Left upper leg, Right lower leg (including foot), Left lower leg (including foot) Bathing: 4: Min-Patient completes 8-9 [id number] parts or 75+ percent  FIM - Upper Body Dressing/Undressing Upper body dressing/undressing steps patient completed: Thread/unthread right bra strap, Thread/unthread left bra strap, Thread/unthread right sleeve of pullover shirt/dresss, Thread/unthread left sleeve of pullover shirt/dress, Put head through opening of pull over shirt/dress, Pull shirt over trunk Upper body dressing/undressing: 4: Min-Patient completed 75 plus % of tasks FIM - Lower Body Dressing/Undressing Lower body dressing/undressing steps patient completed: Thread/unthread left underwear leg, Pull underwear up/down, Thread/unthread right pants leg, Thread/unthread left pants leg, Pull pants up/down, Don/Doff right sock, Don/Doff left sock Lower body dressing/undressing: 4: Min-Patient completed 75 plus % of tasks  FIM - Toileting Toileting steps completed by patient: Adjust clothing prior to toileting, Adjust clothing after toileting Toileting Assistive Devices: Grab bar or rail for support Toileting: 3: Mod-Patient completed 2 of 3 steps  FIM - TRadio producerDevices: Grab bars Toilet Transfers: 4-To toilet/BSC: Min A (steadying Pt. > 75%), 4-From toilet/BSC: Min A (steadying Pt. > 75%)  FIM - Bed/Chair Transfer Bed/Chair Transfer Assistive Devices: HOB elevated, Arm rests, WCopy 5: Supine > Sit: Supervision (verbal cues/safety issues), 4: Bed > Chair or W/C: Min A (steadying Pt. > 75%), 4: Chair or W/C > Bed: Min A (steadying Pt. > 75%)  FIM - Locomotion: Wheelchair Distance:  75 Locomotion: Wheelchair: 0: Activity did not occur FIM - Locomotion: Ambulation Locomotion: Ambulation Assistive Devices: Astronomer Ambulation/Gait Assistance: 4: Min guard, 5: Supervision Locomotion: Ambulation: 2: Travels 50 - 149 ft with minimal assistance (Pt.>75%)  Comprehension Comprehension Mode: Auditory Comprehension: 5-Follows basic conversation/direction: With no assist  Expression Expression Mode: Verbal Expression: 5-Expresses basic 90% of the time/requires cueing < 10% of the time.  Social Interaction Social Interaction: 5-Interacts appropriately 90% of the time - Needs monitoring or encouragement for participation or interaction.  Problem Solving Problem Solving: 4-Solves basic 75 - 89% of the time/requires cueing 10 - 24% of the time  Memory Memory: 4-Recognizes or recalls 75 - 89% of the time/requires cueing 10 - 24% of the time  Medical Problem List and Plan: 1. Functional deficits secondary to severe debilitation related to gastric cancer and subsequent gastrectomy with jejunostomy tube 07/20/2014, reviewed GE junction esophagram which showed no evidence of anastomotic leak. Okayed for diet upgrade as tolerated. We'll start clear liquids. 2. DVT Prophylaxis/Anticoagulation: SCDs. Monitor for any signs of DVT 3. Pain Management: Hycet as needed. Monitor with increased mobility 4. Mood/restlessness: Check sleep chart. Bed alarm for safety 5. Neuropsych: This patient is capable of making decisions on her own behalf. 6. Skin/Wound Care: Routine skin checks of surgical sites as well as routine turning to maintain skin 7. Fluids/Electrolytes/Nutrition: Strict I and O with follow-up chemistries. Dietary consult for tube feedings 8. Asymptomatic VT. follow-up cardiology services. Lopressor 25 mg twice a day, Lasix 20 mg daily. Cardiac rate controlled 9. Diabetes mellitus with peripheral neuropathy. Latest hemoglobin A1c 8.1. Lantus insulin 35units daily at bedtime. Check blood sugars before meals and at bedtime. Monitor closely while on tube feeds 10. Hyperlipidemia. Crestor 11. Decreased  nutritional storage. Continue nutritional support via jejunostomy tube. Dietary follow-up for cycling of tube feeds. Advancing to clear liquids 12.  HCAP  We will simplify antibiotic regimen based on blood culture results LOS (Days) 4 A FACE TO FACE EVALUATION WAS PERFORMED  Ambrielle Kington E 08/01/2014, 12:53 PM

## 2014-08-01 NOTE — Progress Notes (Signed)
Inpatient Diabetes Program Recommendations  AACE/ADA: New Consensus Statement on Inpatient Glycemic Control (2013)  Target Ranges:  Prepandial:   less than 140 mg/dL      Peak postprandial:   less than 180 mg/dL (1-2 hours)      Critically ill patients:  140 - 180 mg/dL   Pt takes 70/30 at home for total of 130 units, 87 units of that is NPH - like insulin and 43 units of R or novolog. Pt presently on 35 units lantus. Please consider increasing lantus to 40 units and adding tube feed coverage of 4-5 units q 4 hrs (novolog)-can be discontinued once tube feeds are stopped. However basal needs are still high.   Thank you, Rosita Kea, RN, CNS, Diabetes Coordinator [phone])

## 2014-08-01 NOTE — Progress Notes (Signed)
Pt is now c/o's pain at right fa insertion site. IV Zosyn infusing x1hr at 12.5cc's per hr. Site is tender to light touch and light pink in color. Discussed with chg RN and will stop for now. NS restarted at ko rate of 20cc's per hour. After 5 min, when asked pt denies pain. Will pass on during handoff report and attempt to notify dr Letta Pate in person this am. KM

## 2014-08-01 NOTE — Progress Notes (Signed)
Per Dr. Benay Spice : He will see her in rehab department. Reschedule office visit for 2-3 weeks. POF sent.

## 2014-08-01 NOTE — Progress Notes (Signed)
Physical Therapy Session Note  Patient Details  Name: Tammy Hughes MRN: [id number] Date of Birth: [birth year]  Today's Date: 08/01/2014 PT Individual Time: 0945-1000 and 4332-9518 and 8416-6063 PT Individual Time Calculation (min): 15 min and 32 min and 30 min  Short Term Goals: Week 1:  PT Short Term Goal 1 (Week 1): Pt will perform supine<>sit with mod I with HOB flat, no rails. PT Short Term Goal 2 (Week 1): Pt will  transfer from bed<>w/c with supervision using LRAD. PT Short Term Goal 3 (Week 1): Pt will ambulate x150' using LRAD with min guard and no overt LOB. PT Short Term Goal 4 (Week 1): Pt will perform w/c mobility x150' with supervision and 25% cueing. PT Short Term Goal 5 (Week 1): Pt will negotiate standard curb with LRAD and min A.  Skilled Therapeutic Interventions/Progress Updates:    Treatment Session 1: Pt received semi reclined in bed, accompanied by husband; agreeable to therapy. Due to need for nursing care, session was abbreviated. Focus was on transfers, functional standing balance. Pt performed supine > sit with HOB elevated (due to order for HOB > 30 degrees) with supervision, increased time. Pt expressing need to have BM at this time. Therefore, pt performed sit<>stand with supervision using rolling walker. Due to pt c/o lightheadedness upon standing, transported to bathroom in w/c for safety. Pt performed stand pivot transfer from w/c <> toilet with min A using grab bars. Due to urinary incontinence, assisted pt with hygiene and clothing change. Session ended in pt room, where pt was left seated in w/c with daughter, grandson, and PT present for upcoming session.  Treatment Session 2: Pt received seated in w/c; agreeable to therapy. Session focused on functional standing balance and gait training. Transported pt to gym in w/c with total A for energy conservation. See below for detailed description of NMR/Balance with focus on standing on dynamic surfaces. Due to pt  continuing to demonstrate posterior LOB when balance challenged in standing, performed reaching for objects at lower heights in standing to promote utilization of hip strategy with posterior LOB. Pt again expressing urgent need to have BM. Therefore, transported pt in w/c to rehab apartment bathroom where pt performed toilet transfer as described above in initial session. Notified RN of loose stools, abdominal discomfort. Upon returning to pt room, pt requesting to return to bed. Educated pt on importance of attempting to stay seated in chair between therapies to increase endurance, upright tolerance. Pt agreeable. Departed with pt left seated in w/c in no apparent distress with all needs within reach. See Locomotion for details in reference to higher level ambulation using rolling walker.   Treatment Session 3: Pt received semi reclined in bed; agreeable to therapy and requesting to use bathroom. Pt performed supine > sit with supervision using rail, HOB elevated 30 degrees due to tube feed. Pt ambulated to/from bathroom with min A, L HHA with assist for IV pole management. Pt performed toilet transfer with min A, no AD, and no grab bars. Remainder of session focused on education on incentive spirometer. Explained, demonstrated purpose of spirometer and technique for using device. Pt initially required mod cues to properly use device; however, pt eventually exhibited effective return demonstration. Recommended pt utilize incentive spirometer >10 times per hour. Pt verbally agreed. Provided paper handout to promote carryover. Departed with pt seated in w/c with husband and daughter present and all needs within reach.  Therapy Documentation Precautions:  Precautions Precautions: Fall Precaution Comments: jejunostomy tube, gastrostomy Restrictions  Weight Bearing Restrictions: No Vital Signs: Therapy Vitals Pulse Rate: 71 BP: 140/68 mmHg Pain: Session 1:  No pain Session 2: Pain Assessment Pain  Assessment: 0-10 Pain Score: 3  Pain Type: Acute pain Pain Location: Abdomen Pain Orientation: Mid;Lower Pain Descriptors / Indicators: Discomfort;Cramping Pain Onset: Gradual Pain Intervention(s): RN made aware Session 3:  No pain Locomotion : High Level Ambulation High Level Ambulation: Side stepping;Backwards walking Side Stepping: Pt performed side stepping in bilat directions with rolling walker and multimodal cueing for safe proximity to rolling walker and maintaining walker on ground while stepping. Pt will need reinforcement. Backwards Walking: Pt performed retro walking with rolling walker and verbal/demonstration cueing with min A; pt will need reinforcement.  Balance: Balance Balance Assessed: Yes Static Standing Balance Static Stance: On foam Static Stance: on Foam: Static standing with single UE support for < 3 seconds prior to posterior LOB.  See FIM for current functional status  Therapy/Group: Individual Therapy  Abbie Berling, Malva Cogan 08/01/2014, 12:22 PM

## 2014-08-02 ENCOUNTER — Inpatient Hospital Stay (HOSPITAL_COMMUNITY): Payer: Medicare Other | Admitting: Physical Therapy

## 2014-08-02 ENCOUNTER — Inpatient Hospital Stay (HOSPITAL_COMMUNITY): Payer: Medicare Other

## 2014-08-02 ENCOUNTER — Telehealth: Payer: Self-pay | Admitting: Oncology

## 2014-08-02 ENCOUNTER — Ambulatory Visit: Payer: Medicare Other | Admitting: Oncology

## 2014-08-02 ENCOUNTER — Inpatient Hospital Stay (HOSPITAL_COMMUNITY): Payer: Medicare Other | Admitting: Occupational Therapy

## 2014-08-02 LAB — GLUCOSE, CAPILLARY
GLUCOSE-CAPILLARY: 271 mg/dL — AB (ref 70–99)
GLUCOSE-CAPILLARY: 289 mg/dL — AB (ref 70–99)
GLUCOSE-CAPILLARY: 248 mg/dL — AB (ref 70–99)
Glucose-Capillary: 240 mg/dL — ABNORMAL HIGH (ref 70–99)
Glucose-Capillary: 256 mg/dL — ABNORMAL HIGH (ref 70–99)

## 2014-08-02 MED ORDER — INSULIN GLARGINE 100 UNIT/ML ~~LOC~~ SOLN
40.0000 [IU] | Freq: Every day | SUBCUTANEOUS | Status: DC
  Administered 2014-08-02 – 2014-08-09 (×8): 40 [IU] via SUBCUTANEOUS
  Filled 2014-08-02 (×9): qty 0.4

## 2014-08-02 NOTE — Progress Notes (Signed)
Inpatient Diabetes Program Recommendations  AACE/ADA: New Consensus Statement on Inpatient Glycemic Control (2013)  Target Ranges:  Prepandial:   less than 140 mg/dL      Peak postprandial:   less than 180 mg/dL (1-2 hours)      Critically ill patients:  140 - 180 mg/dL   Inpatient Diabetes Program Recommendations Insulin - Basal: xxxxxxxxxxx  Please consider adding tube feed coverage of 4-5 units q 4 hrs to cover tube feeds. If TF is stopped or interrupted, the coverage can also be stopped at that time.  Thank you, Rosita Kea, RN, CNS, Diabetes Coordinator [phone])

## 2014-08-02 NOTE — Progress Notes (Signed)
NUTRITION FOLLOW UP  INTERVENTION: Continue Glucerna 1.2 via J-tube at goal rate of 60 ml/hr x 20 hours. (Adjusting TF rate to compensate for TF being off 4 hours per day for therapies)  120 ml H2O every 6 hours  Provides: 1440 kcal, 72 grams of protein, and 973 ml H2O.   Encourage PO intake.  Recommend obtaining new weight to fully assess weight trends.  NUTRITION DIAGNOSIS: Inadequate oral intake related to inability to eat as evidenced by NPO status; ongoing  Goal: Intake to meet >90% of estimated nutrition needs; met  Monitor:  TF tolerance/adequacy, weight trend, labs.  81 y.o. female  Admitting Dx: Gastric cancer  ASSESSMENT: Patient admitted on 1/6 S/P laparoscopy with proximal gastrectomy and feeding jejunostomy.  Pt has been tolerating her tube feeding well with no other difficulties. Will continue with current TF regimen. Pt is currently on a clear liquid diet. Meal completion per pt report is 30%. She reports her appetite is just "ok". Pt was encouraged to consume as much as she can at her meals.  CBG's 240-289 ml/dL.  Height: Ht Readings from Last 1 Encounters:  07/28/14 5' (1.524 m)    Weight: Wt Readings from Last 1 Encounters:  07/28/14 154 lb 9.6 oz (70.126 kg)    BMI:  30.4 - class I obesity   Re-Estimated Nutritional Needs: Kcal: 1400-1700 Protein: 65-80 gm Fluid: 1.5 L  Skin: abdominal incision, non-pitting RUE edema  Diet Order: Diet clear liquid   Intake/Output Summary (Last 24 hours) at 08/02/14 1526 Last data filed at 08/02/14 1233  Gross per 24 hour  Intake   2503 ml  Output      0 ml  Net   2503 ml    Last BM: 1/18  Labs:   Recent Labs Lab 07/27/14 0358 07/29/14 0735 08/01/14 0617  NA 136 139 133*  K 3.7 4.2 4.6  CL 102 100 100  CO2 _0 BUN _1 CREATININE 0.78 0.83 0.83  CALCIUM 9.5 9.5 9.0  GLUCOSE 249* 241* 256*    CBG (last 3)   Recent Labs  08/02/14 0035 08/02/14 0352 08/02/14 1134   GLUCAP 240* 271* 289*    Scheduled Meds:  Continuous Infusions:  Past Medical History  Diagnosis Date  . HYPERCHOLESTEROLEMIA 02/01/2008  . ANXIETY 03/08/2008  . PERIPHERAL NEUROPATHY 02/23/2007  . HYPERTENSION 03/08/2008  . GERD 02/23/2007  . DIVERTICULOSIS, COLON 03/08/2008  . OSTEOARTHRITIS 02/23/2007  . OSTEOPOROSIS 02/23/2007  . Urolithiasis   . Allergy     SEASONAL  . Cataract     REMOVED BILATERAL  . DIABETES MELLITUS, TYPE I 02/23/2007  . Depression     Past Surgical History  Procedure Laterality Date  . Cholecystectomy  1995  . Esophagogastroduodenoscopy  06/08/2002  . Gastrectomy N/A 07/20/2014    Procedure: PROXIMAL GASTRECTOMY;  Surgeon: Stark Klein, MD;  Location: [place];  Service: General;  Laterality: N/A;  . Laparoscopy N/A 07/20/2014    Procedure: LAPAROSCOPY DIAGNOSTIC;  Surgeon: Stark Klein, MD;  Location: [place];  Service: General;  Laterality: N/A;  . Gastrojejunostomy N/A 07/20/2014    Procedure: GASTROJEJUNOSTOMY;  Surgeon: Stark Klein, MD;  Location: [place];  Service: General;  Laterality: N/A;    Kallie Locks, MS, RD, LDN Pager # [phone] After hours/ weekend pager # [phone]

## 2014-08-02 NOTE — Progress Notes (Addendum)
Peripheral IV infiltrated.Orders were received from [place] to hold on IV antibiotics until the results of chest Xray are ready.Keep monitoring pt.

## 2014-08-02 NOTE — Progress Notes (Signed)
Patient in bed, AAOX4, resp easy and regular. Denies pain or needs when asked, family at bedside. IV to RFA intact, NS infusing KVO, IV ATB continue per orders. J-tube to LLQ, glucerna 1.2 infusing at 60/hr per orders. Midline abdominal incision, edges approximated, steri-strips intact, no S/S infection noted. Continent of B/B, BM today. Abdomen soft, slightly tender around incision. Makes needs n=known, call light in reach. SCDs in place, bilat LE. Lonell Face, RN

## 2014-08-02 NOTE — Telephone Encounter (Signed)
lvm for pt advised on Feb appt.....mailed pt appt sched and letter

## 2014-08-02 NOTE — Progress Notes (Signed)
Occupational Therapy Session Note  Patient Details  Name: Tammy Hughes MRN: [id number] Date of Birth: 10/22/1932  Today's Date: 08/02/2014 OT Individual Time: 4287-6811 OT Individual Time Calculation (min): 60 min    Short Term Goals: Week 1:  OT Short Term Goal 1 (Week 1): Pt will complete bathing at shower level with min assist OT Short Term Goal 2 (Week 1): Pt will complete UB dressing (bra included) with supervision OT Short Term Goal 3 (Week 1): Pt will complete LB dressing (including don and tie shoes) with min assist OT Short Term Goal 4 (Week 1): Pt will complete shower transfer with min assist with LRAD OT Short Term Goal 5 (Week 1): Pt will complete toilet transfer with supervision with LRAD  Skilled Therapeutic Interventions/Progress Updates:    Engaged in ADL retraining with focus on overall activity tolerance and endurance.  Pt willing to participate in treatment session, opting to complete bathing and dressing from EOB.  Pt setup assist for all bathing and dressing this session, requiring multiple rest breaks throughout session.  Spontaneous use of incentive spirometer midway through session with good carryover of education from previous sessions.  Pt reported urgent need to toilet and requested BSC.  Stand pivot with min/steady assist for transfer.  Oral hygiene completed in standing at sink with close supervision.  Pt requested to return to bed at end of session.  Therapy Documentation Precautions:  Precautions Precautions: Fall Precaution Comments: jejunostomy tube, gastrostomy Restrictions Weight Bearing Restrictions: No General:   Vital Signs: Therapy Vitals Pulse Rate: 71 BP: (!) 133/55 mmHg Patient Position (if appropriate): Sitting Oxygen Therapy SpO2: 98 % O2 Device: Not Delivered Pain: Pain Assessment Pain Assessment: No/denies pain  See FIM for current functional status  Therapy/Group: Individual Therapy  Simonne Come 08/02/2014, 10:49 AM

## 2014-08-02 NOTE — Progress Notes (Signed)
81 y.o. right handed female with recent diagnosis of new gastric cancer followed by gastroenterology Dr. Deatra Ina that was incidentally discovered upon endoscopy to follow-up for esophageal stricture. Biopsies positive for adenocarcinoma. Patient independent prior to admission living with her husband. Admitted 07/20/2014 underwent proximal gastrectomy and feeding jejunostomy per Dr. Barry Dienes. Hospital course pain management. On 07/24/2014 with 18 beats of NSVT. EKG showed normal sinus rhythm with borderline LVH. There was no chest pain or shortness of breath. Cardiology service is consulted. Troponin mildly elevated 0.60. Echocardiogram with ejection fraction of 60% without PFO motion abnormalities..           Subjective/Complaints: Patient tolerating clear liquids Reviewed weekend events, had elevated temp x 1 , thank and Zosyn started afebrile since that time most recent white count reduced from 17 K to 12 K Appreciate Onc note Review of Systems - Negative except fatigue Objective: Vital Signs: Blood pressure 125/50, pulse 71, temperature 97.8 F (36.6 C), temperature source Oral, resp. rate 18, height 5' (1.524 m), weight 70.126 kg (154 lb 9.6 oz), SpO2 97 %. No results found. Results for orders placed or performed during the hospital encounter of 07/28/14 (from the past 72 hour(s))  Glucose, capillary     Status: Abnormal   Collection Time: 07/30/14 11:57 AM  Result Value Ref Range   Glucose-Capillary 181 (H) 70 - 99 mg/dL  Culture, blood (routine x 2)     Status: None (Preliminary result)   Collection Time: 07/30/14 12:11 PM  Result Value Ref Range   Specimen Description BLOOD RIGHT ARM    Special Requests BOTTLES DRAWN AEROBIC AND ANAEROBIC 10CC EACH    Culture             BLOOD CULTURE RECEIVED NO GROWTH TO DATE CULTURE WILL BE HELD FOR 5 DAYS BEFORE ISSUING A FINAL NEGATIVE REPORT Note: Culture results may be compromised due to an excessive volume of blood received in culture  bottles. Performed at [facility]    Report Status PENDING   Culture, blood (routine x 2)     Status: None (Preliminary result)   Collection Time: 07/30/14 12:15 PM  Result Value Ref Range   Specimen Description BLOOD LEFT ARM    Special Requests      BOTTLES DRAWN AEROBIC AND ANAEROBIC 10CC BLUE,5CC RED   Culture             BLOOD CULTURE RECEIVED NO GROWTH TO DATE CULTURE WILL BE HELD FOR 5 DAYS BEFORE ISSUING A FINAL NEGATIVE REPORT Performed at [facility]    Report Status PENDING   Glucose, capillary     Status: Abnormal   Collection Time: 07/30/14  4:43 PM  Result Value Ref Range   Glucose-Capillary 188 (H) 70 - 99 mg/dL  Glucose, capillary     Status: Abnormal   Collection Time: 07/30/14  8:12 PM  Result Value Ref Range   Glucose-Capillary 188 (H) 70 - 99 mg/dL  Glucose, capillary     Status: Abnormal   Collection Time: 07/30/14 11:58 PM  Result Value Ref Range   Glucose-Capillary 209 (H) 70 - 99 mg/dL  Glucose, capillary     Status: Abnormal   Collection Time: 07/31/14  4:14 AM  Result Value Ref Range   Glucose-Capillary 257 (H) 70 - 99 mg/dL  Glucose, capillary     Status: Abnormal   Collection Time: 07/31/14  8:00 AM  Result Value Ref Range   Glucose-Capillary 204 (H) 70 - 99 mg/dL  Glucose, capillary  Status: Abnormal   Collection Time: 07/31/14 12:06 PM  Result Value Ref Range   Glucose-Capillary 186 (H) 70 - 99 mg/dL  Glucose, capillary     Status: Abnormal   Collection Time: 07/31/14  4:26 PM  Result Value Ref Range   Glucose-Capillary 212 (H) 70 - 99 mg/dL  Glucose, capillary     Status: Abnormal   Collection Time: 07/31/14  8:55 PM  Result Value Ref Range   Glucose-Capillary 180 (H) 70 - 99 mg/dL  Glucose, capillary     Status: Abnormal   Collection Time: 07/31/14 11:52 PM  Result Value Ref Range   Glucose-Capillary 208 (H) 70 - 99 mg/dL  Glucose, capillary     Status: Abnormal   Collection Time: 08/01/14  3:49 AM  Result Value  Ref Range   Glucose-Capillary 241 (H) 70 - 99 mg/dL  CBC     Status: Abnormal   Collection Time: 08/01/14  6:17 AM  Result Value Ref Range   WBC 12.5 (H) 4.0 - 10.5 K/uL   RBC 3.64 (L) 3.87 - 5.11 MIL/uL   Hemoglobin 10.4 (L) 12.0 - 15.0 g/dL   HCT 31.3 (L) 36.0 - 46.0 %   MCV 86.0 78.0 - 100.0 fL   MCH 28.6 26.0 - 34.0 pg   MCHC 33.2 30.0 - 36.0 g/dL   RDW 13.9 11.5 - 15.5 %   Platelets 425 (H) 150 - 400 K/uL  Basic metabolic panel     Status: Abnormal   Collection Time: 08/01/14  6:17 AM  Result Value Ref Range   Sodium 133 (L) 135 - 145 mmol/L    Comment: Please note change in reference range.   Potassium 4.6 3.5 - 5.1 mmol/L    Comment: Please note change in reference range.   Chloride 100 96 - 112 mEq/L   CO2 26 19 - 32 mmol/L   Glucose, Bld 256 (H) 70 - 99 mg/dL   BUN 14 6 - 23 mg/dL   Creatinine, Ser 0.83 0.50 - 1.10 mg/dL   Calcium 9.0 8.4 - 10.5 mg/dL   GFR calc non Af Amer 65 (L) >90 mL/min   GFR calc Af Amer 76 (L) >90 mL/min    Comment: (NOTE) The eGFR has been calculated using the CKD EPI equation. This calculation has not been validated in all clinical situations. eGFR's persistently <90 mL/min signify possible Chronic Kidney Disease.    Anion gap 7 5 - 15  Glucose, capillary     Status: Abnormal   Collection Time: 08/01/14  8:10 AM  Result Value Ref Range   Glucose-Capillary 236 (H) 70 - 99 mg/dL  Glucose, capillary     Status: Abnormal   Collection Time: 08/01/14 12:21 PM  Result Value Ref Range   Glucose-Capillary 208 (H) 70 - 99 mg/dL  Glucose, capillary     Status: Abnormal   Collection Time: 08/01/14  4:14 PM  Result Value Ref Range   Glucose-Capillary 221 (H) 70 - 99 mg/dL  Glucose, capillary     Status: Abnormal   Collection Time: 08/01/14  8:43 PM  Result Value Ref Range   Glucose-Capillary 205 (H) 70 - 99 mg/dL  Glucose, capillary     Status: Abnormal   Collection Time: 08/02/14 12:35 AM  Result Value Ref Range   Glucose-Capillary 240 (H)  70 - 99 mg/dL  Glucose, capillary     Status: Abnormal   Collection Time: 08/02/14  3:52 AM  Result Value Ref Range   Glucose-Capillary 271 (  H) 70 - 99 mg/dL   Comment 1 Notify RN      HEENT: normal Cardio: RRR and no murmur Resp: CTA B/L and unlabored GI: BS positive and no distension Extremity:  No Edema Skin:   Wound C/D/I Neuro: Alert/Oriented, Normal Sensory and Abnormal Motor 4/5 Bilateral UE, and LE Musc/Skel:  Normal and LB tender Gen NAD   Assessment/Plan: 1. Functional deficits secondary to severe debilitation related to gastric cancer and subsequent gastrectomy with jejunostomy tube 07/20/2014  which require 3+ hours per day of interdisciplinary therapy in a comprehensive inpatient rehab setting. Physiatrist is providing close team supervision and 24 hour management of active medical problems listed below. Physiatrist and rehab team continue to assess barriers to discharge/monitor patient progress toward functional and medical goals. FIM: FIM - Bathing Bathing Steps Patient Completed: Chest, Right Arm, Left Arm, Front perineal area, Buttocks, Right upper leg, Left upper leg, Right lower leg (including foot), Left lower leg (including foot) Bathing: 4: Min-Patient completes 8-9 26f10 parts or 75+ percent  FIM - Upper Body Dressing/Undressing Upper body dressing/undressing steps patient completed: Thread/unthread right bra strap, Thread/unthread left bra strap, Thread/unthread right sleeve of pullover shirt/dresss, Thread/unthread left sleeve of pullover shirt/dress, Put head through opening of pull over shirt/dress, Pull shirt over trunk Upper body dressing/undressing: 4: Min-Patient completed 75 plus % of tasks FIM - Lower Body Dressing/Undressing Lower body dressing/undressing steps patient completed: Thread/unthread left underwear leg, Pull underwear up/down, Thread/unthread right pants leg, Thread/unthread left pants leg, Pull pants up/down, Don/Doff right sock,  Don/Doff left sock Lower body dressing/undressing: 4: Min-Patient completed 75 plus % of tasks  FIM - Toileting Toileting steps completed by patient: Adjust clothing prior to toileting, Adjust clothing after toileting, Performs perineal hygiene Toileting Assistive Devices: Grab bar or rail for support Toileting: 4: Steadying assist  FIM - TRadio producerDevices: Grab bars Toilet Transfers: 4-To toilet/BSC: Min A (steadying Pt. > 75%), 4-From toilet/BSC: Min A (steadying Pt. > 75%)  FIM - Bed/Chair Transfer Bed/Chair Transfer Assistive Devices: HOB elevated, Arm rests (HOB > 30 degrees due to tube feed) Bed/Chair Transfer: 5: Supine > Sit: Supervision (verbal cues/safety issues), 4: Bed > Chair or W/C: Min A (steadying Pt. > 75%), 4: Chair or W/C > Bed: Min A (steadying Pt. > 75%)  FIM - Locomotion: Wheelchair Distance: 75 Locomotion: Wheelchair: 0: Activity did not occur FIM - Locomotion: Ambulation Locomotion: Ambulation Assistive Devices: Other (comment) (L HHA) Ambulation/Gait Assistance: 4: Min guard, 4: Min assist Locomotion: Ambulation: 1: Travels less than 50 ft with minimal assistance (Pt.>75%)  Comprehension Comprehension Mode: Auditory Comprehension: 5-Follows basic conversation/direction: With no assist  Expression Expression Mode: Verbal Expression: 5-Expresses basic 90% of the time/requires cueing < 10% of the time.  Social Interaction Social Interaction: 5-Interacts appropriately 90% of the time - Needs monitoring or encouragement for participation or interaction.  Problem Solving Problem Solving: 4-Solves basic 75 - 89% of the time/requires cueing 10 - 24% of the time  Memory Memory: 4-Recognizes or recalls 75 - 89% of the time/requires cueing 10 - 24% of the time  Medical Problem List and Plan: 1. Functional deficits secondary to severe debilitation related to gastric cancer and subsequent gastrectomy with jejunostomy tube  07/20/2014, reviewed GE junction esophagram which showed no evidence of anastomotic leak. Okayed for diet upgrade as tolerated. We'll start clear liquids. 2. DVT Prophylaxis/Anticoagulation: SCDs. Monitor for any signs of DVT 3. Pain Management: Hycet as needed. Monitor with increased mobility 4. Mood/restlessness:  Check sleep chart. Bed alarm for safety 5. Neuropsych: This patient is capable of making decisions on her own behalf. 6. Skin/Wound Care: Routine skin checks of surgical sites as well as routine turning to maintain skin 7. Fluids/Electrolytes/Nutrition: Strict I and O with follow-up chemistries. Dietary consult for tube feedings 8. Asymptomatic VT. follow-up cardiology services. Lopressor 25 mg twice a day, Lasix 20 mg daily. Cardiac rate controlled 9. Diabetes mellitus with peripheral neuropathy.uncontrolled Latest hemoglobin A1c 8.1. Lantus insulin 35units daily at bedtime. Check blood sugars before meals and at bedtime. Monitor closely while on tube feeds 10. Hyperlipidemia. Crestor 11. Decreased nutritional storage. Continue nutritional support via jejunostomy tube. Dietary follow-up for cycling of tube feeds. Advancing to clear liquids 12.  HCAP  We will simplify antibiotic regimen based on blood culture results, final report should be available on 1/21 LOS (Days) 5 A FACE TO FACE EVALUATION WAS PERFORMED  KIRSTEINS,ANDREW E 08/02/2014, 8:27 AM

## 2014-08-02 NOTE — Progress Notes (Addendum)
Physical Therapy Session Note  Patient Details  Name: Tammy Hughes MRN: [id number] Date of Birth: 07-05-1933  Today's Date: 08/02/2014 PT Individual Time: 0037-0488 and 1300-1400 PT Individual Time Calculation (min): 62 min and 60 min Short Term Goals: Week 1:  PT Short Term Goal 1 (Week 1): Pt will perform supine<>sit with mod I with HOB flat, no rails. PT Short Term Goal 2 (Week 1): Pt will  transfer from bed<>w/c with supervision using LRAD. PT Short Term Goal 3 (Week 1): Pt will ambulate x150' using LRAD with min guard and no overt LOB. PT Short Term Goal 4 (Week 1): Pt will perform w/c mobility x150' with supervision and 25% cueing. PT Short Term Goal 5 (Week 1): Pt will negotiate standard curb with LRAD and min A.  Skilled Therapeutic Interventions/Progress Updates:    Treatment Session 1: Pt received semi reclined in bed; agreeable to therapy. Pt reporting feeling "much better" today. Pt performed functional ambulation 2 x160' (seated rest breaks between trials) in controlled environment with rolling walker and supervision to min guard, setup assist for management for IV pole. See below for detailed description of NMR interventions. Provided paper handout to promote carryover of proper use of incentive spirometer. Pt verbalized understanding. Upon returning to pt room, pt requesting to use bathroom. Therefore, pt transferred to/from bedside commode with min A, setup assist for IV pole management, and verbal cueing for safe positioning prior to sitting. Departed with pt seated in w/c with husband present and all needs within reach. RN made aware of ongoing occurrence of loose stools during PT sessions.  Treatment Session 2: Pt received semi reclined in bed accompanied by husband. Pt reporting recent episode of emesis which pt attributed to pt having eaten soup at lunch. RN notified. With max encouragement, pt agreeable to attempting to participate in PT session at bed-level. See below for  therapeutic exercises performed in semi reclined. Noted use of accessory breathing muscles during exercises; therefore, transitioned to diaphragmatic breathing with tactile cueing (pt placed one hand at stomach, other at chest). Pt will need reinforcement.   Per pt request to use bathroom, ambulated to/from bathroom with rolling walker and min A, increased time, and setup assist for management of IV pole. Performed sit<>stand to/from bedside commode with rolling walker and min A. Pt required increased time, more assistance (as compared with previous sessions) for toileting due to significant nausea. Departed with pt semi reclined in bed with 3 rails up, bed alarm on, and all needs within reach.  Therapy Documentation Precautions:  Precautions Precautions: Fall Precaution Comments: jejunostomy tube, gastrostomy Restrictions Weight Bearing Restrictions: No Vital Signs: Therapy Vitals Pulse Rate: 71 BP: (!) 133/55 mmHg Patient Position (if appropriate): Sitting Oxygen Therapy SpO2: 98 % O2 Device: Not Delivered Pain: Pain Assessment Pain Assessment: No/denies pain Locomotion : Ambulation Ambulation/Gait Assistance: 4: Min guard;5: Supervision Gait Gait velocity: Self-selected gait speed = .28 m/s  Trunk/Postural Assessment : Postural Control Righting Reactions: With posterior LOB, pt continues to demonstrate consistent stepping strategy but no presence of hip strategy and ineffective ankle strategy.  Therapeutic Exercises:  While semi reclined in bed, pt performed manually-resisted bilat hip abduction/adduction 2 x10 reps per direction, manually resisted bilat hip/knee flexion/extension x15 reps per direction; adductor pillow squeezes x10 reps (x3-second holds), partial bridging x8 reps with tactile cueing at bilat knees for increased weightbearing. Cueing provided throughout for exhalation on exertion due to pt tendency to hold breath. Frequent, prolonged rest breaks required due to pt  c/o  nausea.  NMR: Neuromuscular Facilitation: Activity to increase grading;Activity to increase motor control;Activity to increase sustained activation;Activity to increase anterior-posterior weight shifting;Activity to increase lateral weight shifting Pt performed multidirectional reaching for horseshoes positioned below waist-height (to facilitate hip strategy with posterior LOB), then tossed horseshoes to increase stability with balance perturbations. Pt required close supervision to min guard for stability/balance. Transitioned to standing on inclined wedge (to promote ankle strategy with posterior LOB) while reaching outside BOS laterally in bilat directions. Pt required min guard to mod A to recover from posterior LOB.  See FIM for current functional status  Therapy/Group: Individual Therapy  Stefano Gaul 08/02/2014, 4:58 PM

## 2014-08-03 ENCOUNTER — Inpatient Hospital Stay (HOSPITAL_COMMUNITY): Payer: Medicare Other | Admitting: Occupational Therapy

## 2014-08-03 ENCOUNTER — Inpatient Hospital Stay (HOSPITAL_COMMUNITY): Payer: Medicare Other | Admitting: Physical Therapy

## 2014-08-03 LAB — GLUCOSE, CAPILLARY
GLUCOSE-CAPILLARY: 213 mg/dL — AB (ref 70–99)
GLUCOSE-CAPILLARY: 220 mg/dL — AB (ref 70–99)
GLUCOSE-CAPILLARY: 226 mg/dL — AB (ref 70–99)
GLUCOSE-CAPILLARY: 248 mg/dL — AB (ref 70–99)
Glucose-Capillary: 211 mg/dL — ABNORMAL HIGH (ref 70–99)

## 2014-08-03 LAB — VANCOMYCIN, TROUGH: Vancomycin Tr: 14.3 ug/mL (ref 10.0–20.0)

## 2014-08-03 MED ORDER — FREE WATER
100.0000 mL | Freq: Four times a day (QID) | Status: DC
  Administered 2014-08-03 – 2014-08-10 (×28): 100 mL

## 2014-08-03 MED ORDER — JEVITY 1.2 CAL PO LIQD: 1000.0000 mL | ORAL | Status: DC

## 2014-08-03 MED ORDER — GLUCERNA 1.2 CAL PO LIQD
1000.0000 mL | ORAL | Status: DC
  Administered 2014-08-03 – 2014-08-09 (×5): 1000 mL
  Filled 2014-08-03 (×19): qty 1000

## 2014-08-03 NOTE — Progress Notes (Signed)
Social Work Elease Hashimoto, LCSW Social Worker Signed  Patient Care Conference 08/03/2014  1:32 PM    Expand All Collapse All   Inpatient RehabilitationTeam Conference and Plan of Care Update Date: 08/03/2014   Time: 11;00 AM     Patient Name: Tammy Hughes       Medical Record Number: [id number]  Date of Birth: 09-Jul-1933 Sex: Female         Room/Bed: 4W10C/4W10C-01 Payor Info: Payor: MEDICARE / Plan: MEDICARE PART A AND B / Product Type: *No Product type* /    Admitting Diagnosis: DEBILITY AFTER PARTIAL GASTRECTOMY   Admit Date/Time:  07/28/2014  2:51 PM Admission Comments: No comment available   Primary Diagnosis:  Debility Principal Problem: Debility    Patient Active Problem List     Diagnosis  Date Noted   .  HCAP (healthcare-associated pneumonia)  07/30/2014   .  Debility  07/29/2014   .  Elevated troponin post op - 0.6  07/26/2014   .  Aspirin allergy  07/26/2014   .  Type 2 diabetes mellitus  07/26/2014   .  NSVT post-op  07/24/2014   .  Gastric cancer-s/p gastrectomy 07/20/14  06/15/2014   .  Dysphagia  04/15/2014   .  Pain in joint, lower leg  03/15/2014   .  Cough  08/06/2013   .  Postinflammatory pulmonary fibrosis  08/06/2013   .  NASH (nonalcoholic steatohepatitis)  10/21/2011   .  Knee pain, bilateral  10/21/2011   .  Encounter for long-term (current) use of other medications  11/23/2010   .  Carpal tunnel syndrome of left wrist  11/23/2010   .  RECTAL BLEEDING  03/21/2010   .  BACK PAIN, LUMBAR  12/19/2009   .  TRICHOMONAL URETHRITIS  11/06/2009   .  LEUKOPENIA, MILD  02/15/2009   .  ANXIETY  03/08/2008   .  Essential hypertension  03/08/2008   .  Non-allergic rhinitis  03/08/2008   .  DIVERTICULOSIS, COLON  03/08/2008   .  HYPERCHOLESTEROLEMIA  02/01/2008   .  DIABETES MELLITUS, TYPE I  02/23/2007   .  PERIPHERAL NEUROPATHY  02/23/2007   .  GERD  02/23/2007   .  OSTEOARTHRITIS  02/23/2007   .  OSTEOPOROSIS  02/23/2007     Expected Discharge Date:  Expected Discharge Date: 08/10/14  Team Members Present: Physician leading conference: Dr. Alysia Penna Social Worker Present: Ovidio Kin, LCSW Nurse Present: Heather Roberts, RN PT Present: Georjean Mode, PT;Blair Hobble, PT OT Present: Simonne Come, Dorothyann Gibbs, OT SLP Present: Windell Moulding, SLP PPS Coordinator present : Daiva Nakayama, RN, CRRN        Current Status/Progress  Goal  Weekly Team Focus   Medical     declining therapy at times, cognitive dysfunction  improve endurance  advance diet as tolerated   Bowel/Bladder     Continent to bowel and bladder.Frequent loose stools.   To continue continent to bowel and blader.To decrese the loose stools.  To monitore bladder and bowel function Q shift.    Swallow/Nutrition/ Hydration       na         ADL's     min assist overall  supervision overall  dynamic standing balance, activity tolerance and endurance, family education   Mobility     Min A overall; Berg Balance Scale score: 18/56   Supervision overall  Dynamic standing balance, balance recovery reactions, activity tolerance, initiate pt/family education  Communication       wfl         Safety/Cognition/ Behavioral Observations      no unsafe behaviors         Pain     Ocasional complain about abdominal pain.Getting Vicoden or Tylenol PRN.  To keep pain levels less than 3,on scale 1 to 10.  To monitore pain levels Q 2 hrs. and tolerance to activity.    Skin     Skin dry and intact.Midline incision is heeling with out signs of infection.  To keep skin dry and intact,  To monitore skin condition Q shift.       *See Care Plan and progress notes for long and short-term goals.    Barriers to Discharge:  poor endurance     Possible Resolutions to Barriers:   cont rehab, will d/c on J tube feeds      Discharge Planning/Teaching Needs:   Home with husband and daughter's to roatate-will have 24 hr care at discharge       Team Discussion:    Goals-supervision level,  will go home with J-tube feedings. Currently upgraded to clear liquid diet. Still nauseated MD is addressing. Team asked for SP eval-cognition. IV may come out tomorrow if culture negative. Begin family education.  Neuro-psych to see tomorrow   Revisions to Treatment Plan:    SP to eval-baseline cognition    Continued Need for Acute Rehabilitation Level of Care: The patient requires daily medical management by a physician with specialized training in physical medicine and rehabilitation for the following conditions: Daily direction of a multidisciplinary physical rehabilitation program to ensure safe treatment while eliciting the highest outcome that is of practical value to the patient.: Yes Daily medical management of patient stability for increased activity during participation in an intensive rehabilitation regime.: Yes Daily analysis of laboratory values and/or radiology reports with any subsequent need for medication adjustment of medical intervention for : Post surgical problems;Other  Genora Arp, Gardiner Rhyme 08/03/2014, 1:32 PM                  Patient ID: Tammy Hughes, female   DOB: 09-26-1932, 81 y.o.   MRN: [id number]

## 2014-08-03 NOTE — Progress Notes (Signed)
81 y.o. right handed female with recent diagnosis of new gastric cancer followed by gastroenterology Dr. Deatra Ina that was incidentally discovered upon endoscopy to follow-up for esophageal stricture. Biopsies positive for adenocarcinoma. Patient independent prior to admission living with her husband. Admitted 07/20/2014 underwent proximal gastrectomy and feeding jejunostomy per Dr. Barry Dienes. Hospital course pain management.     Subjective/Complaints: Patient tolerating clear liquids No coughing, had elevated temp x 1 , thank and Zosyn started afebrile since that time most recent white count reduced from 17 K to 12 K Appreciate general surgery note Review of Systems - Negative except fatigue Objective: Vital Signs: Blood pressure 139/56, pulse 60, temperature 98.2 F (36.8 C), temperature source Oral, resp. rate 18, height 5' (1.524 m), weight 70.126 kg (154 lb 9.6 oz), SpO2 96 %. Dg Chest 2 View  08/02/2014   CLINICAL DATA:  Shortness of breath.  EXAM: CHEST  2 VIEW  COMPARISON:  07/29/2014 and 08/06/2013  FINDINGS: Low lung volumes again demonstrated. Coarse prominence of interstitial markings again seen in the peripheral lung zones and lung bases, as noted on prior exams. This is consistent with chronic interstitial lung disease. No evidence of pulmonary consolidation or pleural effusion. Heart size is within normal limits allowing for low lung volumes.  IMPRESSION: Low lung volumes and stable coarse pulmonary interstitial prominence, likely due to chronic interstitial lung disease.   Electronically Signed   By: Earle Gell M.D.   On: 08/02/2014 19:44   Results for orders placed or performed during the hospital encounter of 07/28/14 (from the past 72 hour(s))  Glucose, capillary     Status: Abnormal   Collection Time: 07/31/14 12:06 PM  Result Value Ref Range   Glucose-Capillary 186 (H) 70 - 99 mg/dL  Glucose, capillary     Status: Abnormal   Collection Time: 07/31/14  4:26 PM  Result Value Ref  Range   Glucose-Capillary 212 (H) 70 - 99 mg/dL  Glucose, capillary     Status: Abnormal   Collection Time: 07/31/14  8:55 PM  Result Value Ref Range   Glucose-Capillary 180 (H) 70 - 99 mg/dL  Glucose, capillary     Status: Abnormal   Collection Time: 07/31/14 11:52 PM  Result Value Ref Range   Glucose-Capillary 208 (H) 70 - 99 mg/dL  Glucose, capillary     Status: Abnormal   Collection Time: 08/01/14  3:49 AM  Result Value Ref Range   Glucose-Capillary 241 (H) 70 - 99 mg/dL  CBC     Status: Abnormal   Collection Time: 08/01/14  6:17 AM  Result Value Ref Range   WBC 12.5 (H) 4.0 - 10.5 K/uL   RBC 3.64 (L) 3.87 - 5.11 MIL/uL   Hemoglobin 10.4 (L) 12.0 - 15.0 g/dL   HCT 31.3 (L) 36.0 - 46.0 %   MCV 86.0 78.0 - 100.0 fL   MCH 28.6 26.0 - 34.0 pg   MCHC 33.2 30.0 - 36.0 g/dL   RDW 13.9 11.5 - 15.5 %   Platelets 425 (H) 150 - 400 K/uL  Basic metabolic panel     Status: Abnormal   Collection Time: 08/01/14  6:17 AM  Result Value Ref Range   Sodium 133 (L) 135 - 145 mmol/L    Comment: Please note change in reference range.   Potassium 4.6 3.5 - 5.1 mmol/L    Comment: Please note change in reference range.   Chloride 100 96 - 112 mEq/L   CO2 26 19 - 32 mmol/L   Glucose,  Bld 256 (H) 70 - 99 mg/dL   BUN 14 6 - 23 mg/dL   Creatinine, Ser 0.83 0.50 - 1.10 mg/dL   Calcium 9.0 8.4 - 10.5 mg/dL   GFR calc non Af Amer 65 (L) >90 mL/min   GFR calc Af Amer 76 (L) >90 mL/min    Comment: (NOTE) The eGFR has been calculated using the CKD EPI equation. This calculation has not been validated in all clinical situations. eGFR's persistently <90 mL/min signify possible Chronic Kidney Disease.    Anion gap 7 5 - 15  Glucose, capillary     Status: Abnormal   Collection Time: 08/01/14  8:10 AM  Result Value Ref Range   Glucose-Capillary 236 (H) 70 - 99 mg/dL  Glucose, capillary     Status: Abnormal   Collection Time: 08/01/14 12:21 PM  Result Value Ref Range   Glucose-Capillary 208 (H) 70  - 99 mg/dL  Glucose, capillary     Status: Abnormal   Collection Time: 08/01/14  4:14 PM  Result Value Ref Range   Glucose-Capillary 221 (H) 70 - 99 mg/dL  Glucose, capillary     Status: Abnormal   Collection Time: 08/01/14  8:43 PM  Result Value Ref Range   Glucose-Capillary 205 (H) 70 - 99 mg/dL  Glucose, capillary     Status: Abnormal   Collection Time: 08/02/14 12:35 AM  Result Value Ref Range   Glucose-Capillary 240 (H) 70 - 99 mg/dL  Glucose, capillary     Status: Abnormal   Collection Time: 08/02/14  3:52 AM  Result Value Ref Range   Glucose-Capillary 271 (H) 70 - 99 mg/dL   Comment 1 Notify RN   Glucose, capillary     Status: Abnormal   Collection Time: 08/02/14 11:34 AM  Result Value Ref Range   Glucose-Capillary 289 (H) 70 - 99 mg/dL  Glucose, capillary     Status: Abnormal   Collection Time: 08/02/14  4:21 PM  Result Value Ref Range   Glucose-Capillary 248 (H) 70 - 99 mg/dL  Glucose, capillary     Status: Abnormal   Collection Time: 08/02/14  7:57 PM  Result Value Ref Range   Glucose-Capillary 256 (H) 70 - 99 mg/dL   Comment 1 Notify RN   Glucose, capillary     Status: Abnormal   Collection Time: 08/03/14 12:22 AM  Result Value Ref Range   Glucose-Capillary 220 (H) 70 - 99 mg/dL   Comment 1 Notify RN   Glucose, capillary     Status: Abnormal   Collection Time: 08/03/14  3:57 AM  Result Value Ref Range   Glucose-Capillary 226 (H) 70 - 99 mg/dL   Comment 1 Notify RN   Glucose, capillary     Status: Abnormal   Collection Time: 08/03/14  7:39 AM  Result Value Ref Range   Glucose-Capillary 211 (H) 70 - 99 mg/dL     HEENT: normal Cardio: RRR and no murmur Resp: CTA B/L and unlabored GI: BS positive and no distension Extremity:  No Edema Skin:   Wound C/D/I Neuro: Alert/Oriented, Normal Sensory and Abnormal Motor 4/5 Bilateral UE, and LE Musc/Skel:  Normal and LB tender Gen NAD Ambulates in the hallway short step length and wide base of support no evidence  of toe drag or need stability  Assessment/Plan: 1. Functional deficits secondary to severe debilitation related to gastric cancer and subsequent gastrectomy with jejunostomy tube 07/20/2014  which require 3+ hours per day of interdisciplinary therapy in a comprehensive inpatient rehab  setting. Physiatrist is providing close team supervision and 24 hour management of active medical problems listed below. Physiatrist and rehab team continue to assess barriers to discharge/monitor patient progress toward functional and medical goals. FIM: FIM - Bathing Bathing Steps Patient Completed: Chest, Right Arm, Left Arm, Front perineal area, Buttocks, Right upper leg, Left upper leg, Right lower leg (including foot), Left lower leg (including foot), Abdomen Bathing: 4: Steadying assist  FIM - Upper Body Dressing/Undressing Upper body dressing/undressing steps patient completed: Thread/unthread right sleeve of pullover shirt/dresss, Thread/unthread left sleeve of pullover shirt/dress, Put head through opening of pull over shirt/dress, Pull shirt over trunk Upper body dressing/undressing: 5: Set-up assist to: Obtain clothing/put away FIM - Lower Body Dressing/Undressing Lower body dressing/undressing steps patient completed: Thread/unthread left underwear leg, Pull underwear up/down, Thread/unthread right pants leg, Thread/unthread left pants leg, Pull pants up/down, Don/Doff right sock, Don/Doff left sock, Thread/unthread right underwear leg Lower body dressing/undressing: 4: Steadying Assist  FIM - Toileting Toileting steps completed by patient: Adjust clothing after toileting, Performs perineal hygiene Toileting Assistive Devices: Grab bar or rail for support Toileting: 4: Steadying assist  FIM - Radio producer Devices: Engineer, civil (consulting), Insurance account manager Transfers: 4-To toilet/BSC: Min A (steadying Pt. > 75%), 4-From toilet/BSC: Min A (steadying Pt. > 75%)  FIM - Bed/Chair  Transfer Bed/Chair Transfer Assistive Devices: HOB elevated, Arm rests, Bed rails, Walker Bed/Chair Transfer: 5: Supine > Sit: Supervision (verbal cues/safety issues), 5: Sit > Supine: Supervision (verbal cues/safety issues), 5: Bed > Chair or W/C: Supervision (verbal cues/safety issues), 5: Chair or W/C > Bed: Supervision (verbal cues/safety issues)  FIM - Locomotion: Wheelchair Distance: 75 Locomotion: Wheelchair: 0: Activity did not occur (Pt ambulatory on unit) FIM - Locomotion: Ambulation Locomotion: Ambulation Assistive Devices: Administrator Ambulation/Gait Assistance: 5: Supervision, 4: Min guard Locomotion: Ambulation: 4: Travels 150 ft or more with minimal assistance (Pt.>75%)  Comprehension Comprehension Mode: Auditory Comprehension: 5-Understands complex 90% of the time/Cues < 10% of the time  Expression Expression Mode: Verbal Expression: 5-Expresses basic 90% of the time/requires cueing < 10% of the time.  Social Interaction Social Interaction: 5-Interacts appropriately 90% of the time - Needs monitoring or encouragement for participation or interaction.  Problem Solving Problem Solving: 5-Solves basic 90% of the time/requires cueing < 10% of the time  Memory Memory: 4-Recognizes or recalls 75 - 89% of the time/requires cueing 10 - 24% of the time  Medical Problem List and Plan: 1. Functional deficits secondary to severe debilitation related to gastric cancer and subsequent gastrectomy with jejunostomy tube 07/20/2014, reviewed GE junction esophagram which showed no evidence of anastomotic leak. Okayed for diet upgrade as tolerated. tolerating clear liquids.patient does not feel like she is ready to advance to full liquids 2. DVT Prophylaxis/Anticoagulation: SCDs. Monitor for any signs of DVT 3. Pain Management: Hycet as needed. Monitor with increased mobility 4. Mood/restlessness: Check sleep chart. Bed alarm for safety 5. Neuropsych: This patient is capable of  making decisions on her own behalf. 6. Skin/Wound Care: Routine skin checks of surgical sites as well as routine turning to maintain skin 7. Fluids/Electrolytes/Nutrition: Strict I and O with follow-up chemistries. Dietary consult for tube feedings 8. Asymptomatic VT. follow-up cardiology services. Lopressor 25 mg twice a day, Lasix 20 mg daily. Cardiac rate controlled 9. Diabetes mellitus with peripheral neuropathy.uncontrolled Latest hemoglobin A1c 8.1. Lantus insulin 40units daily at bedtime. Check blood sugars before meals and at bedtime. Monitor closely while on tube feeds 10. Hyperlipidemia. Crestor 11. Decreased  nutritional storage. Continue nutritional support via jejunostomy tube. Dietary follow-up for cycling of tube feeds. Advancing to clear liquids 12.  HCAP  We will simplify antibiotic regimen based on blood culture results, final report should be available on 1/21 LOS (Days) 6 A FACE TO FACE EVALUATION WAS PERFORMED  Nita Whitmire E 08/03/2014, 9:30 AM

## 2014-08-03 NOTE — Progress Notes (Signed)
  Subjective: Feeling well overall.  No additional fevers.  Tolerating tube feeds.  Pain controlled.    Objective: Vital signs in last 24 hours: Temp:  [97.8 F (36.6 C)-99.5 F (37.5 C)] 98.2 F (36.8 C) (01/20 0617) Pulse Rate:  [60-71] 60 (01/20 0617) Resp:  [18] 18 (01/20 0617) BP: (126-139)/(52-61) 139/56 mmHg (01/20 0617) SpO2:  [96 %-98 %] 96 % (01/20 0617) Last BM Date: 08/02/14  Intake/Output from previous day: 01/19 0701 - 01/20 0700 In: 1444 [P.O.:463; NG/GT:981] Out: -  Intake/Output this shift:    General appearance: alert, cooperative and no distress Chest wall: no tenderness GI: soft, sl distended, non tender, wound c/d/i. J tube in place  Lab Results:   Recent Labs  08/01/14 0617  WBC 12.5*  HGB 10.4*  HCT 31.3*  PLT 425*   BMET  Recent Labs  08/01/14 0617  NA 133*  K 4.6  CL 100  CO2 26  GLUCOSE 256*  BUN 14  CREATININE 0.83  CALCIUM 9.0   PT/INR No results for input(s): LABPROT, INR in the last 72 hours. ABG No results for input(s): PHART, HCO3 in the last 72 hours.  Invalid input(s): PCO2, PO2  Studies/Results: Dg Chest 2 View  08/02/2014   CLINICAL DATA:  Shortness of breath.  EXAM: CHEST  2 VIEW  COMPARISON:  07/29/2014 and 08/06/2013  FINDINGS: Low lung volumes again demonstrated. Coarse prominence of interstitial markings again seen in the peripheral lung zones and lung bases, as noted on prior exams. This is consistent with chronic interstitial lung disease. No evidence of pulmonary consolidation or pleural effusion. Heart size is within normal limits allowing for low lung volumes.  IMPRESSION: Low lung volumes and stable coarse pulmonary interstitial prominence, likely due to chronic interstitial lung disease.   Electronically Signed   By: Earle Gell M.D.   On: 08/02/2014 19:44    Anti-infectives: Anti-infectives    Start     Dose/Rate Route Frequency Ordered Stop   07/31/14 0000  vancomycin (VANCOCIN) IVPB 750 mg/150 ml  premix     750 mg150 mL/hr over 60 Minutes Intravenous Every 12 hours 07/30/14 1036     07/30/14 2200  piperacillin-tazobactam (ZOSYN) IVPB 3.375 g     3.375 g12.5 mL/hr over 240 Minutes Intravenous Every 8 hours 07/30/14 1438     07/30/14 1100  piperacillin-tazobactam (ZOSYN) IVPB 3.375 g  Status:  Discontinued     3.375 g12.5 mL/hr over 240 Minutes Intravenous Every 8 hours 07/30/14 1036 07/30/14 1438   07/30/14 1045  vancomycin (VANCOCIN) IVPB 1000 mg/200 mL premix     1,000 mg200 mL/hr over 60 Minutes Intravenous  Once 07/30/14 1036 07/30/14 1321      Assessment/Plan: s/p * No surgery found * cycle tube feeds  Continue PT Suspect UTI was source of isolated temp.     LOS: 6 days    [facility] 08/03/2014

## 2014-08-03 NOTE — Progress Notes (Signed)
Physical Therapy Session Note  Patient Details  Name: Tammy Hughes MRN: [id number] Date of Birth: [birth year]  Today's Date: 08/03/2014 PT Individual Time: 1010-1030 PT Individual Time Calculation (min): 20 min   Short Term Goals: Week 1:  PT Short Term Goal 1 (Week 1): Pt will perform supine<>sit with mod I with HOB flat, no rails. PT Short Term Goal 2 (Week 1): Pt will  transfer from bed<>w/c with supervision using LRAD. PT Short Term Goal 3 (Week 1): Pt will ambulate x150' using LRAD with min guard and no overt LOB. PT Short Term Goal 4 (Week 1): Pt will perform w/c mobility x150' with supervision and 25% cueing. PT Short Term Goal 5 (Week 1): Pt will negotiate standard curb with LRAD and min A.  Skilled Therapeutic Interventions/Progress Updates:  Make up session: tx focused on neuro re-ed.  AROM and PROM bil heel cords in sitting.  Pt performed [place] A exs for balance reactions/fall risk reduction: calf raises, mini squats, 10 x 1 each.  Balance retraining- Standing on wedge to facilitate ankle strategies, and reaching forward (shile standing on wedge) with R or L hands for cards to facilitate forward wt shift/ hip strategy.    Therapy Documentation Precautions:  Precautions Precautions: Fall Precaution Comments: jejunostomy tube, gastrostomy Restrictions Weight Bearing Restrictions: No  Pain: Pain Assessment Pain Assessment: No/denies pain      See FIM for current functional status  Therapy/Group: Individual Therapy  Seferina Brokaw 08/03/2014, 10:36 AM

## 2014-08-03 NOTE — Progress Notes (Addendum)
Inpatient Diabetes Program Recommendations  AACE/ADA: New Consensus Statement on Inpatient Glycemic Control (2013)  Target Ranges:  Prepandial:   less than 140 mg/dL      Peak postprandial:   less than 180 mg/dL (1-2 hours)      Critically ill patients:  140 - 180 mg/dL   Reason for Visit: Results for JAUNITA, MIKELS (MRN [id number]) as of 08/03/2014 12:20  Ref. Range 08/02/2014 19:57 08/03/2014 00:22 08/03/2014 03:57 08/03/2014 07:39  Glucose-Capillary Latest Range: 70-99 mg/dL 256 (H) 220 (H) 226 (H) 211 (H)   Note plans for nocturnal tube feeds from 6p-7a.  May consider adding Novolog tube feed coverage 4 units at 8p, 12 midnight, and 4a to cover CHO in tube feeds.  Will follow.  Thanks, Adah Perl, RN, BC-ADM Inpatient Diabetes Coordinator Pager [phone]

## 2014-08-03 NOTE — Progress Notes (Signed)
ANTIBIOTIC CONSULT NOTE - INITIAL  Pharmacy Consult:  Vancomycin / Zosyn Indication:  PNA  Allergies  Allergen Reactions  . Pirfenidone Diarrhea and Nausea And Vomiting  . Aspirin     rash    Patient Measurements: Height: 5' (152.4 cm) Weight: 154 lb 9.6 oz (70.126 kg) IBW/kg (Calculated) : 45.5  Vital Signs: Temp: 98.2 F (36.8 C) (01/20 0617) Temp Source: Oral (01/20 0617) BP: 139/56 mmHg (01/20 0617) Pulse Rate: 60 (01/20 0617) Intake/Output from previous day: 01/19 0701 - 01/20 0700 In: 1444 [P.O.:463; NG/GT:981] Out: -  Intake/Output from this shift: Total I/O In: 140 [P.O.:140] Out: -   Labs:  Recent Labs  08/01/14 0617  WBC 12.5*  HGB 10.4*  PLT 425*  CREATININE 0.83   Estimated Creatinine Clearance: 48 mL/min (by C-G formula based on Cr of 0.83).  Recent Labs  08/03/14 1105  [place] 14.3     Microbiology: Recent Results (from the past 720 hour(s))  MRSA PCR Screening     Status: None   Collection Time: 07/21/14  5:27 AM  Result Value Ref Range Status   MRSA by PCR NEGATIVE NEGATIVE Final    Comment:        The GeneXpert MRSA Assay (FDA approved for NASAL specimens only), is one component of a comprehensive MRSA colonization surveillance program. It is not intended to diagnose MRSA infection nor to guide or monitor treatment for MRSA infections.   Culture, blood (routine x 2)     Status: None (Preliminary result)   Collection Time: 07/30/14 12:11 PM  Result Value Ref Range Status   Specimen Description BLOOD RIGHT ARM  Final   Special Requests BOTTLES DRAWN AEROBIC AND ANAEROBIC 10CC EACH  Final   Culture   Final           BLOOD CULTURE RECEIVED NO GROWTH TO DATE CULTURE WILL BE HELD FOR 5 DAYS BEFORE ISSUING A FINAL NEGATIVE REPORT Note: Culture results may be compromised due to an excessive volume of blood received in culture bottles. Performed at [facility]    Report Status PENDING  Incomplete  Culture, blood  (routine x 2)     Status: None (Preliminary result)   Collection Time: 07/30/14 12:15 PM  Result Value Ref Range Status   Specimen Description BLOOD LEFT ARM  Final   Special Requests   Final    BOTTLES DRAWN AEROBIC AND ANAEROBIC 10CC BLUE,5CC RED   Culture   Final           BLOOD CULTURE RECEIVED NO GROWTH TO DATE CULTURE WILL BE HELD FOR 5 DAYS BEFORE ISSUING A FINAL NEGATIVE REPORT Performed at [facility]    Report Status PENDING  Incomplete    Medical History: Past Medical History  Diagnosis Date  . HYPERCHOLESTEROLEMIA 02/01/2008  . ANXIETY 03/08/2008  . PERIPHERAL NEUROPATHY 02/23/2007  . HYPERTENSION 03/08/2008  . GERD 02/23/2007  . DIVERTICULOSIS, COLON 03/08/2008  . OSTEOARTHRITIS 02/23/2007  . OSTEOPOROSIS 02/23/2007  . Urolithiasis   . Allergy     SEASONAL  . Cataract     REMOVED BILATERAL  . DIABETES MELLITUS, TYPE I 02/23/2007  . Depression       Assessment: 10 YOF on D#5 vancomycin and zosyn for HCAP. Tm 99.5. Vancomycin trough 14.3 close to therapeutic range. Most recent scr 0.83 on 1/18, with est. crcl ~ 45 ml/min. Per MD note, will will simplify antibiotic regimen based on blood culture results tomorrow.  Vanc 1/16 >> Zosyn 1/16 >>  1/16 BCx x2 - ngtd   Goal of Therapy:  Vancomycin trough level 15-20 mcg/ml   Plan:  - Continue vancomycin 750mg  IV Q12H - Continue Zosyn 3.375gm IV Q8H, 4 hr infusion - Monitor renal fxn, clinical progress - f/u LOT   Maryanna Shape, PharmD, BCPS  Clinical Pharmacist  Pager: [phone]

## 2014-08-03 NOTE — Progress Notes (Signed)
Occupational Therapy Session Note  Patient Details  Name: Tammy Hughes MRN: [id number] Date of Birth: Apr 21, 1933  Today's Date: 08/03/2014 OT Individual Time: 2449-7530 and 1335-1405 OT Individual Time Calculation (min): 55 min (with 10 min makeup session) and 30 min   Short Term Goals: Week 1:  OT Short Term Goal 1 (Week 1): Pt will complete bathing at shower level with min assist OT Short Term Goal 2 (Week 1): Pt will complete UB dressing (bra included) with supervision OT Short Term Goal 3 (Week 1): Pt will complete LB dressing (including don and tie shoes) with min assist OT Short Term Goal 4 (Week 1): Pt will complete shower transfer with min assist with LRAD OT Short Term Goal 5 (Week 1): Pt will complete toilet transfer with supervision with LRAD  Skilled Therapeutic Interventions/Progress Updates:   1) Engaged in ADL retraining with focus on beginning family education with granddaughter regarding purpose of OT and progress towards goals.  Pt reports having "an accident" and being cleaned up prior to session, therefore requested to only complete UB bathing and dressing at sink.  Pt required increased time due to decreased endurance and hesitancy due to IV placement in Lt hand.  Lowered BSC and had pt complete stand pivot transfer to/from [facility] with supervision.  Discussed possibility of requiring BSC at home due to urgency.  Ambulated to bathroom with RW and supervision, with therapist managing IV pole.  Educated granddaughter on providing supervision with all mobility and self-care tasks.  Discussed bathroom layout and shower accessibility.  Pt requested to return to bed at end of session.  Educated on increased activity tolerance and OOB, with pt reporting understanding but returned to bed to rest until next session.  2) Initiated family training with pt's youngest daughter and husband who both live in the home with the pt.  Pt received seated up in w/c.  Discussed bathroom setup and  simulated walk-in shower transfer with use of "blue box" with 3" ledge to simulate threshold for shower at home.  Recommend pt have a shower seat for safety and energy conservation.  Educated pt on stepping over ledge backwards to allow for use of RW for stability.  Pt demonstrated transfer x2 with supervision.  Ambulated from ADL apt back to room > 150 feet with RW and supervision while therapist managed IV pole.  Pt able to locate room without assist.  Discussed progress towards goals and plan for supervision upon d/c with husband reporting understanding.    Therapy Documentation Precautions:  Precautions Precautions: Fall Precaution Comments: jejunostomy tube, gastrostomy Restrictions Weight Bearing Restrictions: No Pain: Pain Assessment Pain Assessment:  (Entry made in error   [place])  See FIM for current functional status  Therapy/Group: Individual Therapy  Simonne Come 08/03/2014, 12:16 PM

## 2014-08-03 NOTE — Progress Notes (Signed)
Occupational Therapy Note  Patient Details  Name: Tammy Hughes MRN: [id number] Date of Birth: 26-Feb-1933  Attempted time: 1300  Attempted unscheduled make up time but patient refused secondary to not finished with lunch and fatigued.   Phineas Semen 08/03/2014, 1:44 PM

## 2014-08-03 NOTE — Progress Notes (Signed)
NUTRITION FOLLOW UP/CONSULT  INTERVENTION: Stop current tube feeding infusion for anticipation of nocturnal tube feeding.  At 1800 daily, initiate nocturnal tube feeding of Glucerna 1.2 via J-tube at goal rate of 90 ml/hr x 13 hours (6PM-7AM) to provide 1404 kcal, 70 grams of protein, and 948 ml of free water.   Provide 100 ml of free water flushes every 6 hours (4 times daily).  Encourage PO intake.  NUTRITION DIAGNOSIS: Inadequate oral intake related to inability to eat as evidenced by NPO status; ongoing  Goal: Intake to meet >90% of estimated nutrition needs; met  Monitor:  TF tolerance/adequacy, weight trend, labs.  81 y.o. female  Admitting Dx: Gastric cancer  ASSESSMENT: Patient admitted on 1/6 S/P laparoscopy with proximal gastrectomy and feeding jejunostomy.  RD consulted for enteral/tube feeding management and initiate of nocturnal tube feeding. Pt has been tolerating her tube feeding well with no other difficulties. Pt is currently on a clear liquid diet. Meal completion is 30%. Continue encouragement of PO intake at meals. Will continue to monitor.  CBG's 211-256 ml/dL.  Height: Ht Readings from Last 1 Encounters:  07/28/14 5' (1.524 m)    Weight: Wt Readings from Last 1 Encounters:  07/28/14 154 lb 9.6 oz (70.126 kg)    BMI:  30.4 - class I obesity   Re-Estimated Nutritional Needs: Kcal: 1400-1750 Protein: 65-85 gm Fluid: >/=1.5 L  Skin: abdominal incision, non-pitting RUE edema  Diet Order: Diet clear liquid   Intake/Output Summary (Last 24 hours) at 08/03/14 1117 Last data filed at 08/03/14 0800  Gross per 24 hour  Intake   1324 ml  Output      0 ml  Net   1324 ml    Last BM: 1/19  Labs:   Recent Labs Lab 07/29/14 0735 08/01/14 0617  NA 139 133*  K 4.2 4.6  CL 100 100  CO2 28 26  BUN 16 14  CREATININE 0.83 0.83  CALCIUM 9.5 9.0  GLUCOSE 241* 256*    CBG (last 3)   Recent Labs  08/03/14 0022 08/03/14 0357  08/03/14 0739  GLUCAP 220* 226* 211*    Scheduled Meds:  Continuous Infusions:  Past Medical History  Diagnosis Date  . HYPERCHOLESTEROLEMIA 02/01/2008  . ANXIETY 03/08/2008  . PERIPHERAL NEUROPATHY 02/23/2007  . HYPERTENSION 03/08/2008  . GERD 02/23/2007  . DIVERTICULOSIS, COLON 03/08/2008  . OSTEOARTHRITIS 02/23/2007  . OSTEOPOROSIS 02/23/2007  . Urolithiasis   . Allergy     SEASONAL  . Cataract     REMOVED BILATERAL  . DIABETES MELLITUS, TYPE I 02/23/2007  . Depression     Past Surgical History  Procedure Laterality Date  . Cholecystectomy  1995  . Esophagogastroduodenoscopy  06/08/2002  . Gastrectomy N/A 07/20/2014    Procedure: PROXIMAL GASTRECTOMY;  Surgeon: Stark Klein, MD;  Location: [place];  Service: General;  Laterality: N/A;  . Laparoscopy N/A 07/20/2014    Procedure: LAPAROSCOPY DIAGNOSTIC;  Surgeon: Stark Klein, MD;  Location: [place];  Service: General;  Laterality: N/A;  . Gastrojejunostomy N/A 07/20/2014    Procedure: GASTROJEJUNOSTOMY;  Surgeon: Stark Klein, MD;  Location: [place];  Service: General;  Laterality: N/A;    Kallie Locks, MS, RD, LDN Pager # [phone] After hours/ weekend pager # [phone]

## 2014-08-03 NOTE — Progress Notes (Signed)
Physical Therapy Session Note  Patient Details  Name: Tammy Hughes MRN: [id number] Date of Birth: 1932-12-15  Today's Date: 08/03/2014 PT Individual Time: 3557-3220 and 1510-1610 PT Individual Time Calculation (min): 45 min and 60 min  Short Term Goals: Week 1:  PT Short Term Goal 1 (Week 1): Pt will perform supine<>sit with mod I with HOB flat, no rails. PT Short Term Goal 2 (Week 1): Pt will  transfer from bed<>w/c with supervision using LRAD. PT Short Term Goal 3 (Week 1): Pt will ambulate x150' using LRAD with min guard and no overt LOB. PT Short Term Goal 4 (Week 1): Pt will perform w/c mobility x150' with supervision and 25% cueing. PT Short Term Goal 5 (Week 1): Pt will negotiate standard curb with LRAD and min A.  Skilled Therapeutic Interventions/Progress Updates:    Treatment Session 1: Pt received seated in bed, finishing breakfast with daughter present. Pt agreeable to therapy. Session focused on increasing activity tolerance, improving pt independence with functional mobility. Pt ambulated to/from bathroom with rolling walker and supervision/setup for management of IV pole. Pt performed sit<>stand to/from [facility] with rolling walker, supervision/setup for IV pole management. Transitioned to functional ambulation x200' consecutively with supervision in hallway, min guard for obstacle negotiation in room; pt utilized rolling walker for initial 160' of gait trial and performed final 29' without assistive device with no overt LOB; cueing required for increased bilat step length/clearance. Session ended in pt room, where pt was left seated in w/c with all needs within reach. Of note, pt still unable to recall room number, location of room when ambulated back from PT session.  Treatment Session 2: Pt received semi reclined in bed;agreeable to therapy. Session focused on initiation of hands-on training with husband. Explained and demonstrated technique for providing supervision/appropriate  cueing with the following: sit<>stand transfers from multiple height surfaces with rolling walker and supervision, verbal cueing for safe hand placement; sit<>stand transfers from elevated bed height (to simulate home setup) with manual stabilization of rolling walker; ambulation x225' in controlled environment with rolling walker and supervision to min guard; simulated car transfer with rolling walker and supervision; negotiation of standard curb step with manual stabilization of rolling walker and supervision; negotiation of 6 stairs total with initial 3 stairs forward-facing with bilat rails and subsequent 2 stairs laterally with bilat UE support at single rail. Husband gave effective return demonstration of providing supervision with all said aspects of functional mobility. Husband did not provide setup assist for IV pole management, as pt not planned to need IV at D/C.  Discussed pt's fluctuating assist level with pt and husband. Per discussion, all in agreement that pt will need a w/c at D/C if pt continues to fluctuate in functional status/assist level when fatigued, nauseated. This PT also made the following recommendations to husband: that husband participate in hands-on training if/when pt requires more assistance; and that all family members planning to provide supervision/assist at D/C are scheduled to participate in hands-on family training. Pt/husband verbalized understanding and were in full agreement. Session ended in pt room, where pt was left seated semi reclined in bed with 3 rails up, bed alarm on, and family present.  Therapy Documentation Precautions:  Precautions Precautions: Fall Precaution Comments: jejunostomy tube, gastrostomy Restrictions Weight Bearing Restrictions: No Vital Signs: Therapy Vitals Temp: 98.2 F (36.8 C) Temp Source: Oral Pulse Rate: 60 Resp: 18 BP: (!) 139/56 mmHg Patient Position (if appropriate): Lying Oxygen Therapy SpO2: 96 % O2 Device: Not  Delivered Pain:  Pain Assessment Pt denies pain Locomotion : Ambulation Ambulation/Gait Assistance: 5: Supervision;4: Min guard   See FIM for current functional status  Therapy/Group: Individual Therapy  Azrael Huss, Malva Cogan 08/03/2014, 11:23 AM

## 2014-08-03 NOTE — Patient Care Conference (Signed)
Inpatient RehabilitationTeam Conference and Plan of Care Update Date: 08/03/2014   Time: 11;00 AM    Patient Name: Tammy Hughes      Medical Record Number: [id number]  Date of Birth: July 20, 1932 Sex: Female         Room/Bed: 4W10C/4W10C-01 Payor Info: Payor: MEDICARE / Plan: MEDICARE PART A AND B / Product Type: *No Product type* /    Admitting Diagnosis: DEBILITY AFTER PARTIAL GASTRECTOMY  Admit Date/Time:  07/28/2014  2:51 PM Admission Comments: No comment available   Primary Diagnosis:  Debility Principal Problem: Debility  Patient Active Problem List   Diagnosis Date Noted  . HCAP (healthcare-associated pneumonia) 07/30/2014  . Debility 07/29/2014  . Elevated troponin post op - 0.6 07/26/2014  . Aspirin allergy 07/26/2014  . Type 2 diabetes mellitus 07/26/2014  . NSVT post-op 07/24/2014  . Gastric cancer-s/p gastrectomy 07/20/14 06/15/2014  . Dysphagia 04/15/2014  . Pain in joint, lower leg 03/15/2014  . Cough 08/06/2013  . Postinflammatory pulmonary fibrosis 08/06/2013  . NASH (nonalcoholic steatohepatitis) 10/21/2011  . Knee pain, bilateral 10/21/2011  . Encounter for long-term (current) use of other medications 11/23/2010  . Carpal tunnel syndrome of left wrist 11/23/2010  . RECTAL BLEEDING 03/21/2010  . BACK PAIN, LUMBAR 12/19/2009  . TRICHOMONAL URETHRITIS 11/06/2009  . LEUKOPENIA, MILD 02/15/2009  . ANXIETY 03/08/2008  . Essential hypertension 03/08/2008  . Non-allergic rhinitis 03/08/2008  . DIVERTICULOSIS, COLON 03/08/2008  . HYPERCHOLESTEROLEMIA 02/01/2008  . DIABETES MELLITUS, TYPE I 02/23/2007  . PERIPHERAL NEUROPATHY 02/23/2007  . GERD 02/23/2007  . OSTEOARTHRITIS 02/23/2007  . OSTEOPOROSIS 02/23/2007    Expected Discharge Date: Expected Discharge Date: 08/10/14  Team Members Present: Physician leading conference: Dr. Alysia Penna Social Worker Present: Ovidio Kin, LCSW Nurse Present: Heather Roberts, RN PT Present: Georjean Mode, PT;Blair Hobble,  PT OT Present: Simonne Come, Dorothyann Gibbs, OT SLP Present: Windell Moulding, SLP PPS Coordinator present : Daiva Nakayama, RN, CRRN     Current Status/Progress Goal Weekly Team Focus  Medical   declining therapy at times, cognitive dysfunction  improve endurance  advance diet as tolerated   Bowel/Bladder   Continent to bowel and bladder.Frequent loose stools.  To continue continent to bowel and blader.To decrese the loose stools.  To monitore bladder and bowel function Q shift.   Swallow/Nutrition/ Hydration     na        ADL's   min assist overall  supervision overall  dynamic standing balance, activity tolerance and endurance, family education   Mobility   Min A overall; Berg Balance Scale score: 18/56  Supervision overall  Dynamic standing balance, balance recovery reactions, activity tolerance, initiate pt/family education   Communication     wfl        Safety/Cognition/ Behavioral Observations    no unsafe behaviors        Pain   Ocasional complain about abdominal pain.Getting Vicoden or Tylenol PRN.  To keep pain levels less than 3,on scale 1 to 10.  To monitore pain levels Q 2 hrs. and tolerance to activity.   Skin   Skin dry and intact.Midline incision is heeling with out signs of infection.  To keep skin dry and intact,  To monitore skin condition Q shift.      *See Care Plan and progress notes for long and short-term goals.  Barriers to Discharge: poor endurance    Possible Resolutions to Barriers:  cont rehab, will d/c on J tube feeds    Discharge Planning/Teaching Needs:  Home  with husband and daughter's to roatate-will have 24 hr care at discharge      Team Discussion:  Goals-supervision level, will go home with J-tube feedings. Currently upgraded to clear liquid diet. Still nauseated MD is addressing. Team asked for SP eval-cognition. IV may come out tomorrow if culture negative. Begin family education.  Neuro-psych to see tomorrow  Revisions to Treatment Plan:   SP to eval-baseline cognition   Continued Need for Acute Rehabilitation Level of Care: The patient requires daily medical management by a physician with specialized training in physical medicine and rehabilitation for the following conditions: Daily direction of a multidisciplinary physical rehabilitation program to ensure safe treatment while eliciting the highest outcome that is of practical value to the patient.: Yes Daily medical management of patient stability for increased activity during participation in an intensive rehabilitation regime.: Yes Daily analysis of laboratory values and/or radiology reports with any subsequent need for medication adjustment of medical intervention for : Post surgical problems;Other  Lucion Dilger, Gardiner Rhyme 08/03/2014, 1:32 PM

## 2014-08-03 NOTE — Progress Notes (Signed)
Social Work Patient ID: Tammy Hughes, female   DOB: September 15, 1932, 81 y.o.   MRN: [id number] Met with pt and granddaughter to inform of team conference goals-supervision level and targeted discharge date 1/27.  Pt is tired from therapies and would like breaks in her schedule. Still nauseous and MD is addressing. On a clear liquid diet and she is trying to eat it.  She would like all of the tubes taken out of her arm and stomach.  Aware may be able to take IV out tomorrow if cultures come back negative. Talk with daughter when here.

## 2014-08-04 ENCOUNTER — Inpatient Hospital Stay (HOSPITAL_COMMUNITY): Payer: Medicare Other | Admitting: Occupational Therapy

## 2014-08-04 ENCOUNTER — Encounter (HOSPITAL_COMMUNITY): Payer: Medicare Other

## 2014-08-04 ENCOUNTER — Ambulatory Visit (HOSPITAL_COMMUNITY): Payer: Medicare Other | Admitting: *Deleted

## 2014-08-04 ENCOUNTER — Inpatient Hospital Stay (HOSPITAL_COMMUNITY): Payer: Medicare Other | Admitting: Speech Pathology

## 2014-08-04 DIAGNOSIS — E1142 Type 2 diabetes mellitus with diabetic polyneuropathy: Secondary | ICD-10-CM | POA: Diagnosis present

## 2014-08-04 LAB — GLUCOSE, CAPILLARY
GLUCOSE-CAPILLARY: 177 mg/dL — AB (ref 70–99)
GLUCOSE-CAPILLARY: 197 mg/dL — AB (ref 70–99)
GLUCOSE-CAPILLARY: 241 mg/dL — AB (ref 70–99)
Glucose-Capillary: 196 mg/dL — ABNORMAL HIGH (ref 70–99)
Glucose-Capillary: 218 mg/dL — ABNORMAL HIGH (ref 70–99)
Glucose-Capillary: 118 mg/dL — ABNORMAL HIGH (ref 70–99)
Glucose-Capillary: 180 mg/dL — ABNORMAL HIGH (ref 70–99)

## 2014-08-04 MED ORDER — AMOXICILLIN-POT CLAVULANATE 400-57 MG/5ML PO SUSR
875.0000 mg | Freq: Two times a day (BID) | ORAL | Status: DC
  Administered 2014-08-04 – 2014-08-08 (×9): 875 mg via ORAL
  Filled 2014-08-04 (×3): qty 10.9
  Filled 2014-08-04: qty 7.3
  Filled 2014-08-04: qty 10.9
  Filled 2014-08-04: qty 7.3
  Filled 2014-08-04 (×3): qty 10.9
  Filled 2014-08-04: qty 7.3
  Filled 2014-08-04 (×2): qty 10.9

## 2014-08-04 NOTE — Progress Notes (Signed)
Occupational Therapy Weekly Progress Note  Patient Details  Name: Tammy Hughes MRN: [id number] Date of Birth: October 07, 1932  Beginning of progress report period: July 28, 2014 End of progress report period: August 04, 2014   Today's Date: 08/04/2014 OT Individual Time: 0600-4599 OT Individual Time Calculation (min): 60 min    Patient has met 2 of 5 short term goals, and partly met the other 3 of 3 short term goals.  Pt has been unable to complete bathing at shower level due to IV and running feeding tube, but can complete bathing at overall supervision level at sink, nor has she not worn shoes that fasten or a bra but can complete dressing with supervision. Family education has been initiated, as pt has multiple family members who will be providing care.  Pt is continuing to make steady progress towards goals showing overall increase in activity tolerance and endurance.  Patient continues to demonstrate the following deficits: decreased activity tolerance and endurance, decreased balance reactions and therefore will continue to benefit from skilled OT intervention to enhance overall performance with BADL and Reduce care partner burden.  Patient progressing toward long term goals..  Continue plan of care.  OT Short Term Goals Week 1:  OT Short Term Goal 1 (Week 1): Pt will complete bathing at shower level with min assist OT Short Term Goal 1 - Progress (Week 1): Partly met OT Short Term Goal 2 (Week 1): Pt will complete UB dressing (bra included) with supervision OT Short Term Goal 2 - Progress (Week 1): Partly met OT Short Term Goal 3 (Week 1): Pt will complete LB dressing (including don and tie shoes) with min assist OT Short Term Goal 3 - Progress (Week 1): Partly met OT Short Term Goal 4 (Week 1): Pt will complete shower transfer with min assist with LRAD OT Short Term Goal 4 - Progress (Week 1): Met OT Short Term Goal 5 (Week 1): Pt will complete toilet transfer with supervision  with LRAD OT Short Term Goal 5 - Progress (Week 1): Met Week 2:  OT Short Term Goal 1 (Week 2): STG = LTGs due to remaining LOS  Skilled Therapeutic Interventions/Progress Updates:    Engaged in ADL retraining with focus on ADL retraining and increased participation in self-care tasks.  Pt seated on BSC upon arrival reporting having an accident prior to session.  Pt completed perineal hygiene post toileting with supervision.  Bathing and dressing completed at sit > stand level at sink due to still connected to IV.  Pt supervision overall with all self-care tasks this session.  Left seated upright in w/c with breakfast tray.  Therapy Documentation Precautions:  Precautions Precautions: Fall Precaution Comments: jejunostomy tube, gastrostomy Restrictions Weight Bearing Restrictions: No General:   Vital Signs: Therapy Vitals Pulse Rate: 65 BP: (!) 128/58 mmHg Pain:  Pt with no c/o pain  See FIM for current functional status  Therapy/Group: Individual Therapy  Simonne Come 08/04/2014, 10:45 AM

## 2014-08-04 NOTE — Progress Notes (Signed)
81 y.o. right handed female with recent diagnosis of new gastric cancer followed by gastroenterology Dr. Deatra Ina that was incidentally discovered upon endoscopy to follow-up for esophageal stricture. Biopsies positive for adenocarcinoma. Patient independent prior to admission living with her husband. Admitted 07/20/2014 underwent proximal gastrectomy and feeding jejunostomy per Dr. Barry Dienes. Hospital course pain management.     Subjective/Complaints: Discussed with Oncology today No problems overnite with therapy Does not feel ready to upgrade diet to full Review of Systems - Negative except fatigue Objective: Vital Signs: Blood pressure 128/58, pulse 65, temperature 98.7 F (37.1 C), temperature source Oral, resp. rate 18, height 5' (1.524 m), weight 67.7 kg (149 lb 4 oz), SpO2 100 %. Dg Chest 2 View  08/02/2014   CLINICAL DATA:  Shortness of breath.  EXAM: CHEST  2 VIEW  COMPARISON:  07/29/2014 and 08/06/2013  FINDINGS: Low lung volumes again demonstrated. Coarse prominence of interstitial markings again seen in the peripheral lung zones and lung bases, as noted on prior exams. This is consistent with chronic interstitial lung disease. No evidence of pulmonary consolidation or pleural effusion. Heart size is within normal limits allowing for low lung volumes.  IMPRESSION: Low lung volumes and stable coarse pulmonary interstitial prominence, likely due to chronic interstitial lung disease.   Electronically Signed   By: Earle Gell M.D.   On: 08/02/2014 19:44   Results for orders placed or performed during the hospital encounter of 07/28/14 (from the past 72 hour(s))  Glucose, capillary     Status: Abnormal   Collection Time: 08/01/14 12:21 PM  Result Value Ref Range   Glucose-Capillary 208 (H) 70 - 99 mg/dL  Glucose, capillary     Status: Abnormal   Collection Time: 08/01/14  4:14 PM  Result Value Ref Range   Glucose-Capillary 221 (H) 70 - 99 mg/dL  Glucose, capillary     Status: Abnormal    Collection Time: 08/01/14  8:43 PM  Result Value Ref Range   Glucose-Capillary 205 (H) 70 - 99 mg/dL  Glucose, capillary     Status: Abnormal   Collection Time: 08/02/14 12:35 AM  Result Value Ref Range   Glucose-Capillary 240 (H) 70 - 99 mg/dL  Glucose, capillary     Status: Abnormal   Collection Time: 08/02/14  3:52 AM  Result Value Ref Range   Glucose-Capillary 271 (H) 70 - 99 mg/dL   Comment 1 Notify RN   Glucose, capillary     Status: Abnormal   Collection Time: 08/02/14 11:34 AM  Result Value Ref Range   Glucose-Capillary 289 (H) 70 - 99 mg/dL  Glucose, capillary     Status: Abnormal   Collection Time: 08/02/14  4:21 PM  Result Value Ref Range   Glucose-Capillary 248 (H) 70 - 99 mg/dL  Glucose, capillary     Status: Abnormal   Collection Time: 08/02/14  7:57 PM  Result Value Ref Range   Glucose-Capillary 256 (H) 70 - 99 mg/dL   Comment 1 Notify RN   Glucose, capillary     Status: Abnormal   Collection Time: 08/03/14 12:22 AM  Result Value Ref Range   Glucose-Capillary 220 (H) 70 - 99 mg/dL   Comment 1 Notify RN   Glucose, capillary     Status: Abnormal   Collection Time: 08/03/14  3:57 AM  Result Value Ref Range   Glucose-Capillary 226 (H) 70 - 99 mg/dL   Comment 1 Notify RN   Glucose, capillary     Status: Abnormal   Collection Time:  08/03/14  7:39 AM  Result Value Ref Range   Glucose-Capillary 211 (H) 70 - 99 mg/dL  Vancomycin, trough     Status: None   Collection Time: 08/03/14 11:05 AM  Result Value Ref Range   Vancomycin Tr 14.3 10.0 - 20.0 ug/mL  Glucose, capillary     Status: Abnormal   Collection Time: 08/03/14 12:30 PM  Result Value Ref Range   Glucose-Capillary 248 (H) 70 - 99 mg/dL  Glucose, capillary     Status: Abnormal   Collection Time: 08/03/14  4:10 PM  Result Value Ref Range   Glucose-Capillary 213 (H) 70 - 99 mg/dL  Glucose, capillary     Status: Abnormal   Collection Time: 08/03/14  8:26 PM  Result Value Ref Range   Glucose-Capillary 196  (H) 70 - 99 mg/dL  Glucose, capillary     Status: Abnormal   Collection Time: 08/04/14 12:01 AM  Result Value Ref Range   Glucose-Capillary 197 (H) 70 - 99 mg/dL   Comment 1 Notify RN   Glucose, capillary     Status: Abnormal   Collection Time: 08/04/14  3:57 AM  Result Value Ref Range   Glucose-Capillary 241 (H) 70 - 99 mg/dL   Comment 1 Notify RN   Glucose, capillary     Status: Abnormal   Collection Time: 08/04/14  8:13 AM  Result Value Ref Range   Glucose-Capillary 218 (H) 70 - 99 mg/dL     HEENT: normal Cardio: RRR and no murmur Resp: CTA B/L and unlabored GI: BS positive and no distension Extremity:  No Edema Skin:   Wound C/D/I Neuro: Alert/Oriented, Normal Sensory and Abnormal Motor 4/5 Bilateral UE, and LE Musc/Skel:  Normal and LB tender Gen NAD Ambulates in the hallway short step length and wide base of support no evidence of toe drag or need stability  Assessment/Plan: 1. Functional deficits secondary to severe debilitation related to gastric cancer and subsequent gastrectomy with jejunostomy tube 07/20/2014  which require 3+ hours per day of interdisciplinary therapy in a comprehensive inpatient rehab setting. Physiatrist is providing close team supervision and 24 hour management of active medical problems listed below. Physiatrist and rehab team continue to assess barriers to discharge/monitor patient progress toward functional and medical goals. FIM: FIM - Bathing Bathing Steps Patient Completed: Chest, Right Arm, Left Arm, Abdomen, Front perineal area, Buttocks, Right upper leg, Left upper leg, Right lower leg (including foot), Left lower leg (including foot) Bathing: 5: Supervision: Safety issues/verbal cues  FIM - Upper Body Dressing/Undressing Upper body dressing/undressing steps patient completed: Thread/unthread right sleeve of pullover shirt/dresss, Thread/unthread left sleeve of pullover shirt/dress, Put head through opening of pull over shirt/dress, Pull  shirt over trunk Upper body dressing/undressing: 5: Set-up assist to: Obtain clothing/put away FIM - Lower Body Dressing/Undressing Lower body dressing/undressing steps patient completed: Thread/unthread left underwear leg, Pull underwear up/down, Thread/unthread right pants leg, Thread/unthread left pants leg, Pull pants up/down, Don/Doff right sock, Don/Doff left sock, Thread/unthread right underwear leg Lower body dressing/undressing: 5: Set-up assist to: Obtain clothing  FIM - Toileting Toileting steps completed by patient: Adjust clothing prior to toileting, Performs perineal hygiene, Adjust clothing after toileting Toileting Assistive Devices: Grab bar or rail for support Toileting: 5: Set-up assist to: Obtain supplies  FIM - Radio producer Devices: Bedside commode Toilet Transfers: 5-From toilet/BSC: Supervision (verbal cues/safety issues)  FIM - Control and instrumentation engineer Devices: HOB elevated, Arm rests, Bed rails, Walker Bed/Chair Transfer: 5: Supine > Sit:  Supervision (verbal cues/safety issues), 5: Sit > Supine: Supervision (verbal cues/safety issues), 5: Bed > Chair or W/C: Supervision (verbal cues/safety issues), 5: Chair or W/C > Bed: Supervision (verbal cues/safety issues)  FIM - Locomotion: Wheelchair Distance: 75 Locomotion: Wheelchair: 0: Activity did not occur (Pt ambulatory on unit) FIM - Locomotion: Ambulation Locomotion: Ambulation Assistive Devices: Administrator Ambulation/Gait Assistance: 5: Supervision, 4: Min guard Locomotion: Ambulation: 4: Travels 150 ft or more with minimal assistance (Pt.>75%)  Comprehension Comprehension Mode: Auditory Comprehension: 5-Understands complex 90% of the time/Cues < 10% of the time  Expression Expression Mode: Verbal Expression: 5-Expresses complex 90% of the time/cues < 10% of the time  Social Interaction Social Interaction: 5-Interacts appropriately 90% of the time  - Needs monitoring or encouragement for participation or interaction.  Problem Solving Problem Solving: 5-Solves basic 90% of the time/requires cueing < 10% of the time  Memory Memory: 4-Recognizes or recalls 75 - 89% of the time/requires cueing 10 - 24% of the time  Medical Problem List and Plan: 1. Functional deficits secondary to severe debilitation related to gastric cancer and subsequent gastrectomy with jejunostomy tube 07/20/2014, reviewed GE junction esophagram which showed no evidence of anastomotic leak. Okayed for diet upgrade as tolerated. tolerating clear liquids.patient does not feel like she is ready to advance to full liquids, discussed with Onc Dr Benay Spice who plans outpt 5 FU 2. DVT Prophylaxis/Anticoagulation: SCDs. Monitor for any signs of DVT 3. Pain Management: Hycet as needed. Monitor with increased mobility 4. Mood/restlessness: Check sleep chart. Bed alarm for safety 5. Neuropsych: This patient is capable of making decisions on her own behalf. 6. Skin/Wound Care: Routine skin checks of surgical sites as well as routine turning to maintain skin 7. Fluids/Electrolytes/Nutrition: Strict I and O with follow-up chemistries. Dietary consult for tube feedings 8. Asymptomatic VT. follow-up cardiology services. Lopressor 25 mg twice a day, Lasix 20 mg daily. Cardiac rate controlled 9. Diabetes mellitus with peripheral neuropathy.uncontrolled Latest hemoglobin A1c 8.1. Lantus insulin 45units daily at bedtime. Check blood sugars before meals and at bedtime. Monitor closely while on tube feeds 10. Hyperlipidemia. Crestor 11. Decreased nutritional storage. Continue nutritional support via jejunostomy tube. Dietary follow-up for cycling of tube feeds. Advancing to clear liquids 12.  HCAP BC neg, finish 7 d abx course with augmentin LOS (Days) 7 A FACE TO FACE EVALUATION WAS PERFORMED  Harika Laidlaw E 08/04/2014, 9:19 AM

## 2014-08-04 NOTE — Progress Notes (Signed)
Patient resting quietly in bed at this time, eyes closed, resp easy and regular. Medicated previously for C/O pain to lower back, positive results. IV to L wrist intact, continues IV ATB therapy, no adverse effects. IV dressing D&I. Noted slight edema to L wrist above IV insertion point, no redness/warmth noted, no pain at site. Patient states swollen prior to IV insertion. Continent of B/B, stand and pivot X1 assist to wheelchair/toilet/bed. Glucerna 1.2 instilling per J-tube, 32ml/hr, with no difficulties, all meds per J-tube. Dressing D&I, no S/S infection at site. Midline abdominal incision WNL, steri-strips intact, no S/S infection noted. Abdomen slightly tender at incision site. Family at bedside. Patient able to make needs known, call light in reach. Lonell Face, RN

## 2014-08-04 NOTE — Evaluation (Signed)
Speech Language Pathology Assessment and Plan  Patient Details  Name: Tammy Hughes MRN: [id number] Date of Birth: 04/17/1933  SLP Diagnosis: Cognitive Impairments  Rehab Potential: Good ELOS: 1/26    Today's Date: 08/04/2014 SLP Individual Time: 0786-7544 SLP Individual Time Calculation (min): 60 min   Problem List:  Patient Active Problem List   Diagnosis Date Noted  . Type 2 diabetes mellitus with peripheral neuropathy 08/04/2014  . HCAP (healthcare-associated pneumonia) 07/30/2014  . Debility 07/29/2014  . Elevated troponin post op - 0.6 07/26/2014  . Aspirin allergy 07/26/2014  . Type 2 diabetes mellitus 07/26/2014  . NSVT post-op 07/24/2014  . Gastric cancer-s/p gastrectomy 07/20/14 06/15/2014  . Dysphagia 04/15/2014  . Pain in joint, lower leg 03/15/2014  . Cough 08/06/2013  . Postinflammatory pulmonary fibrosis 08/06/2013  . NASH (nonalcoholic steatohepatitis) 10/21/2011  . Knee pain, bilateral 10/21/2011  . Encounter for long-term (current) use of other medications 11/23/2010  . Carpal tunnel syndrome of left wrist 11/23/2010  . RECTAL BLEEDING 03/21/2010  . BACK PAIN, LUMBAR 12/19/2009  . TRICHOMONAL URETHRITIS 11/06/2009  . LEUKOPENIA, MILD 02/15/2009  . ANXIETY 03/08/2008  . Essential hypertension 03/08/2008  . Non-allergic rhinitis 03/08/2008  . DIVERTICULOSIS, COLON 03/08/2008  . HYPERCHOLESTEROLEMIA 02/01/2008  . DIABETES MELLITUS, TYPE I 02/23/2007  . PERIPHERAL NEUROPATHY 02/23/2007  . GERD 02/23/2007  . OSTEOARTHRITIS 02/23/2007  . OSTEOPOROSIS 02/23/2007   Past Medical History:  Past Medical History  Diagnosis Date  . HYPERCHOLESTEROLEMIA 02/01/2008  . ANXIETY 03/08/2008  . PERIPHERAL NEUROPATHY 02/23/2007  . HYPERTENSION 03/08/2008  . GERD 02/23/2007  . DIVERTICULOSIS, COLON 03/08/2008  . OSTEOARTHRITIS 02/23/2007  . OSTEOPOROSIS 02/23/2007  . Urolithiasis   . Allergy     SEASONAL  . Cataract     REMOVED BILATERAL  . DIABETES MELLITUS, TYPE I  02/23/2007  . Depression    Past Surgical History:  Past Surgical History  Procedure Laterality Date  . Cholecystectomy  1995  . Esophagogastroduodenoscopy  06/08/2002  . Gastrectomy N/A 07/20/2014    Procedure: PROXIMAL GASTRECTOMY;  Surgeon: Stark Klein, MD;  Location: [facility];  Service: General;  Laterality: N/A;  . Laparoscopy N/A 07/20/2014    Procedure: LAPAROSCOPY DIAGNOSTIC;  Surgeon: Stark Klein, MD;  Location: [facility];  Service: General;  Laterality: N/A;  . Gastrojejunostomy N/A 07/20/2014    Procedure: GASTROJEJUNOSTOMY;  Surgeon: Stark Klein, MD;  Location: [facility];  Service: General;  Laterality: N/A;    Assessment / Plan / Recommendation Clinical Impression Tammy Hughes is a 81 year old right handed female with recent diagnosis of new gastric cancer followed by gastroenterology Dr. Deatra Ina that was incidentally discovered upon endoscopy to follow-up for esophageal stricture. Biopsies positive for adenocarcinoma. Patient independent prior to admission living with her husband. Admitted 07/20/2014 underwent proximal gastrectomy and feeding jejunostomy; GI following for diet advancement.  Patient with bouts of confusion and restlessness at night. Physical therapy evaluation completed 07/22/2014 recommendations CIR consult. Patient was admitted for comprehensive rehabilitation program 07/28/14.  PT/OT requested SLP evaluation to address cognition.  Orders received and evaluation completed 08/04/14.  Patient completed MoCA with score of 11/30 with normal equal to or greater than 25.  Given that patient was managing household tasks and working part time prior to admission recommend skilled SLP services to address cognitive deficits in hopes of maximizing functional independence and reducing burden of care so that patient may return home with husband.    Skilled Therapeutic Interventions  Cognitive-linguistic evaluation completed with results and recommendations reviewed with patient.  SLP also  initiated education regarding effective memory compensatory strategies with handout left for patient.  Plan to reinforce recommendations in ongoing therapy sessions.      SLP Assessment  Patient will need skilled [facility] during CIR admission    Recommendations  Oral Care Recommendations: Oral care BID Patient destination: Home Follow up Recommendations: None Equipment Recommended: None recommended by SLP    SLP Frequency 5 out of 7 days   SLP Treatment/Interventions Cognitive remediation/compensation;Cueing hierarchy;Functional tasks;Internal/external aids;Medication managment;Patient/family education    Pain Pain Assessment Pain Assessment: No/denies pain Prior Functioning Cognitive/Linguistic Baseline: Within functional limits (per patient ) Type of Home: House  Lives With: Spouse Available Help at Discharge: Family;Available 24 hours/day Education: 12th grade Vocation: Part time employment  Short Term Goals: Week 1: SLP Short Term Goal 1 (Week 1): short term goals = long term goals   See FIM for current functional status Refer to Care Plan for Long Term Goals  Recommendations for other services: None  Discharge Criteria: Patient will be discharged from SLP if patient refuses treatment 3 consecutive times without medical reason, if treatment goals not met, if there is a change in medical status, if patient makes no progress towards goals or if patient is discharged from hospital.  The above assessment, treatment plan, treatment alternatives and goals were discussed and mutually agreed upon: by patient  Gunnar Fusi, M.A., CCC-SLP [phone]  [place] 08/04/2014, 10:55 AM

## 2014-08-04 NOTE — Progress Notes (Signed)
Recreational Therapy Session Note  Patient Details  Name: Tammy Hughes MRN: [id number] Date of Birth: 11/09/1932 Today's Date: 08/04/2014  Order received & chart reviewed.  No c/o pain. Met with pt today during co-treat with PT to discuss leisure interests & community pursuits. Pt is anxious to return to previously enjoyed activities.  Discussed accessibility of community settings & adaptations.  Pt stated understanding.Pt also participated in dynamic balance activities during discussion -dual tasking and alternating attention with close supervision-contact guard assist.  Pt with anticipated discharge on 1/27, therefore no further treatment anticipated.   Amnah Breuer 08/04/2014, 3:10 PM

## 2014-08-04 NOTE — Progress Notes (Addendum)
Physical Therapy Session Note  Patient Details  Name: Tammy Hughes MRN: [id number] Date of Birth: 24-Nov-1932  Today's Date: 08/04/2014 PT Individual Time: 1300-1400 (Co-tx with rec therapist for initial 30 min) PT Individual Time Calculation (min): 60 min   Short Term Goals: Week 1:  PT Short Term Goal 1 (Week 1): Pt will perform supine<>sit with mod I with HOB flat, no rails. PT Short Term Goal 2 (Week 1): Pt will  transfer from bed<>w/c with supervision using LRAD. PT Short Term Goal 3 (Week 1): Pt will ambulate x150' using LRAD with min guard and no overt LOB. PT Short Term Goal 4 (Week 1): Pt will perform w/c mobility x150' with supervision and 25% cueing. PT Short Term Goal 5 (Week 1): Pt will negotiate standard curb with LRAD and min A.  Skilled Therapeutic Interventions/Progress Updates:    Co-treament with rec therapist focusing on dynamic standing balance, dual tasking during functional mobility. Pt received semi reclined in bed; agreeable to therapy. Ambulated to/from bathroom with rolling walker and supervision and transferred to/from [facility] with supervision. Pt ambulated x150' in controlled environment with rolling walker and supervision at gait speed of .61 m/s. NMR focused on balance perturbations, dual tasking during mobility. In standing, pt required supervision while throwing/catching ball from varying directions. Transitioned multiple trials of forward/retro ambulation while throwing/catching large ball with min guard. Returned to pt room via functional ambulation x150' in controlled environment without assistive device with close supervision to min guard. Departed with pt semi reclined in bed with 3 rails up, bed alarm on, and husband present.  Therapy Documentation Precautions:  Precautions Precautions: Fall Precaution Comments: jejunostomy tube, gastrostomy Restrictions Weight Bearing Restrictions: No Vital Signs: Therapy Vitals Temp: 98.3 F (36.8 C) Temp Source:  Oral Pulse Rate: 67 Resp: 18 BP: (!) 123/57 mmHg Patient Position (if appropriate): Lying Oxygen Therapy SpO2: 99 % O2 Device: Not Delivered Pain: Pain Assessment Pain Assessment: No/denies pain Locomotion : Ambulation Ambulation/Gait Assistance: 5: Supervision;4: Min guard   See FIM for current functional status  Therapy/Group: Individual Therapy and Co-Treatment  Brnadon Eoff, Malva Cogan 08/04/2014, 5:14 PM

## 2014-08-04 NOTE — Plan of Care (Signed)
Problem: RH SAFETY Goal: RH STG ADHERE TO SAFETY PRECAUTIONS W/ASSISTANCE/DEVICE STG Adhere to Safety Precautions With mod I Assistance/Device.  Outcome: Not Met (add Reason) Patient reported to transfer self with no assistance at times, not calling for assistance, family present, allowing patient to self transfer

## 2014-08-04 NOTE — Progress Notes (Signed)
L wrist

## 2014-08-05 ENCOUNTER — Inpatient Hospital Stay (HOSPITAL_COMMUNITY): Payer: Medicare Other | Admitting: Occupational Therapy

## 2014-08-05 ENCOUNTER — Inpatient Hospital Stay (HOSPITAL_COMMUNITY): Payer: Medicare Other | Admitting: Speech Pathology

## 2014-08-05 ENCOUNTER — Encounter (HOSPITAL_COMMUNITY): Payer: Medicare Other | Admitting: Occupational Therapy

## 2014-08-05 ENCOUNTER — Inpatient Hospital Stay (HOSPITAL_COMMUNITY): Payer: Medicare Other | Admitting: Physical Therapy

## 2014-08-05 LAB — CULTURE, BLOOD (ROUTINE X 2)
CULTURE: NO GROWTH
Culture: NO GROWTH

## 2014-08-05 LAB — GLUCOSE, CAPILLARY
GLUCOSE-CAPILLARY: 130 mg/dL — AB (ref 70–99)
GLUCOSE-CAPILLARY: 225 mg/dL — AB (ref 70–99)
GLUCOSE-CAPILLARY: 258 mg/dL — AB (ref 70–99)
Glucose-Capillary: 207 mg/dL — ABNORMAL HIGH (ref 70–99)
Glucose-Capillary: 85 mg/dL (ref 70–99)
Glucose-Capillary: 157 mg/dL — ABNORMAL HIGH (ref 70–99)

## 2014-08-05 LAB — BASIC METABOLIC PANEL
ANION GAP: 10 (ref 5–15)
BUN: 15 mg/dL (ref 6–23)
CALCIUM: 9.7 mg/dL (ref 8.4–10.5)
CHLORIDE: 98 meq/L (ref 96–112)
CO2: 28 mmol/L (ref 19–32)
CREATININE: 0.98 mg/dL (ref 0.50–1.10)
GFR calc non Af Amer: 53 mL/min — ABNORMAL LOW (ref >90)
GFR, EST AFRICAN AMERICAN: 62 mL/min — AB (ref >90)
Glucose, Bld: 218 mg/dL — ABNORMAL HIGH (ref 70–99)
Potassium: 4.5 mmol/L (ref 3.5–5.1)
SODIUM: 136 mmol/L (ref 135–145)

## 2014-08-05 NOTE — Progress Notes (Signed)
Speech Language Pathology Daily Session Note  Patient Details  Name: Tammy Hughes MRN: [id number] Date of Birth: [birth year]  Today's Date: 08/05/2014 SLP Individual Time: 2836-6294; [id number] SLP Individual Time Calculation (min): 20 min; 10 min  Short Term Goals: Week 1: SLP Short Term Goal 1 (Week 1): short term goals = long term goals   Skilled Therapeutic Interventions:  Session 1:  Pt was seen for skilled ST targeting cognitive goals.  Upon arrival, pt was sitting upright at the edge of the bed working on a word search puzzle.  SLP provided min cues for organizational strategies to locate at least 1 word.  SLP then facilitated the session with skilled education for use of compensatory aids to facilitate carryover of daily information in between therapy session.  Following education, pt identified at least 4 targeted goals of PT/OT and generated a written list with supervision question cues; exhibiting good intellectual awareness of physical deficits.  Pt also identifies that she is altered from her baseline cognitively.  Pt left with speech "homework" to find 1 more word from word search before laying back down in bed.   Session 2:  Pt was seen for skilled make up session for earlier missed time.  Upon arrival, pt's family was present at bedside and pt was reclined in bed, awake, alert, and agreeable to participate in [place].  Pt recalled previously targeted compensatory strategy for memory to educate family on current goals of ST with supervision question cues.  SLP facilitated the session with skilled education related to activities for cognitive remediation/compensation to facilitate carryover of targeted skills in between therapy session.  Pt had already completed speech "homework" from previous therapy session and was able to recall specifically which word she found in the word search puzzle.  Encouraged pt to continue working on word search over the weekend and to have it finished by Monday  1/22.  Continue per current plan of care.    FIM:  Comprehension Comprehension Mode: Auditory Comprehension: 5-Follows basic conversation/direction: With extra time/assistive device Expression Expression Mode: Verbal Expression: 5-Expresses basic needs/ideas: With extra time/assistive device Social Interaction Social Interaction: 4-Interacts appropriately 75 - 89% of the time - Needs redirection for appropriate language or to initiate interaction. Problem Solving Problem Solving: 4-Solves basic 75 - 89% of the time/requires cueing 10 - 24% of the time Memory Memory: 3-Recognizes or recalls 50 - 74% of the time/requires cueing 25 - 49% of the time  Pain Pain Assessment Pain Assessment: No/denies pain  Therapy/Group: Individual Therapy  Richey Doolittle, Selinda Orion 08/05/2014, 4:00 PM

## 2014-08-05 NOTE — Progress Notes (Signed)
Occupational Therapy Session Note  Patient Details  Name: Tammy Hughes MRN: [id number] Date of Birth: 19-Oct-1932  Today's Date: 08/05/2014 OT Individual Time: 7062-3762 and 8315-1761 OT Individual Time Calculation (min): 30 min and 30 min   Short Term Goals: Week 2:  OT Short Term Goal 1 (Week 2): STG = LTGs due to remaining LOS  Skilled Therapeutic Interventions/Progress Updates:   1) Attempted to engage pt in ADL retraining.  Upon arrival pt reports having been nauseous and vomiting post attempts to eat breakfast, reporting still nauseous and requesting this therapist return at a later time.  15 mins later pt willing to participate at EOB with bathing but requesting to wear hospital gown due to still feeling sick.  Supervision bed mobility and completed UB bathing with setup assist.  Pt required increased time and encouragement.  Encouraged pt to participate in OOB activity to which she reports still feeling nauseous and not able to participate at this time.  Pt returned to supine in bed.  RN present and aware of pt's status and missed therapy.  2) Engaged in therapeutic activity with focus on activity tolerance and OOB activity.  Pt reports still feeling nauseous and not wanting to attempt to eat lunch.  Encouraged pt to sit EOB and participate in word search puzzle (as she reports enjoying puzzles).  Supervision supine to sit and encouragement to remain seated.  Pt with constant reports of "bubbling" in stomach but encouraged to remain upright to promote OOB activity.  Therapy Documentation Precautions:  Precautions Precautions: Fall Precaution Comments: jejunostomy tube, gastrostomy Restrictions Weight Bearing Restrictions: No General: General OT Amount of Missed Time: 30 Minutes Vital Signs: Therapy Vitals Temp: 99.1 F (37.3 C) Temp Source: Oral Pulse Rate: 68 Resp: (!) 22 BP: (!) 130/58 mmHg Patient Position (if appropriate): Lying Oxygen Therapy SpO2: 99 % O2 Device:  Not Delivered Pain:  Pt with no c/o pain  See FIM for current functional status  Therapy/Group: Individual Therapy  Simonne Come 08/05/2014, 9:24 AM

## 2014-08-05 NOTE — Progress Notes (Signed)
81 y.o. right handed female with recent diagnosis of new gastric cancer followed by gastroenterology Dr. Deatra Ina that was incidentally discovered upon endoscopy to follow-up for esophageal stricture. Biopsies positive for adenocarcinoma. Patient independent prior to admission living with her husband. Admitted 07/20/2014 underwent proximal gastrectomy and feeding jejunostomy per Dr. Barry Dienes. Hospital course pain management.     Subjective/Complaints: Still on clear liquids. Does not feel ready to advance her diet. Receiving tube feeding Review of Systems - Negative except fatigue Objective: Vital Signs: Blood pressure 85/66, pulse 68, temperature 99.1 F (37.3 C), temperature source Oral, resp. rate 22, height 5' (1.524 m), weight 72 kg (158 lb 11.7 oz), SpO2 99 %. No results found. Results for orders placed or performed during the hospital encounter of 07/28/14 (from the past 72 hour(s))  Glucose, capillary     Status: Abnormal   Collection Time: 08/02/14 11:34 AM  Result Value Ref Range   Glucose-Capillary 289 (H) 70 - 99 mg/dL  Glucose, capillary     Status: Abnormal   Collection Time: 08/02/14  4:21 PM  Result Value Ref Range   Glucose-Capillary 248 (H) 70 - 99 mg/dL  Glucose, capillary     Status: Abnormal   Collection Time: 08/02/14  7:57 PM  Result Value Ref Range   Glucose-Capillary 256 (H) 70 - 99 mg/dL   Comment 1 Notify RN   Glucose, capillary     Status: Abnormal   Collection Time: 08/03/14 12:22 AM  Result Value Ref Range   Glucose-Capillary 220 (H) 70 - 99 mg/dL   Comment 1 Notify RN   Glucose, capillary     Status: Abnormal   Collection Time: 08/03/14  3:57 AM  Result Value Ref Range   Glucose-Capillary 226 (H) 70 - 99 mg/dL   Comment 1 Notify RN   Glucose, capillary     Status: Abnormal   Collection Time: 08/03/14  7:39 AM  Result Value Ref Range   Glucose-Capillary 211 (H) 70 - 99 mg/dL  Vancomycin, trough     Status: None   Collection Time: 08/03/14 11:05 AM   Result Value Ref Range   Vancomycin Tr 14.3 10.0 - 20.0 ug/mL  Glucose, capillary     Status: Abnormal   Collection Time: 08/03/14 12:30 PM  Result Value Ref Range   Glucose-Capillary 248 (H) 70 - 99 mg/dL  Glucose, capillary     Status: Abnormal   Collection Time: 08/03/14  4:10 PM  Result Value Ref Range   Glucose-Capillary 213 (H) 70 - 99 mg/dL  Glucose, capillary     Status: Abnormal   Collection Time: 08/03/14  8:26 PM  Result Value Ref Range   Glucose-Capillary 196 (H) 70 - 99 mg/dL  Glucose, capillary     Status: Abnormal   Collection Time: 08/04/14 12:01 AM  Result Value Ref Range   Glucose-Capillary 197 (H) 70 - 99 mg/dL   Comment 1 Notify RN   Glucose, capillary     Status: Abnormal   Collection Time: 08/04/14  3:57 AM  Result Value Ref Range   Glucose-Capillary 241 (H) 70 - 99 mg/dL   Comment 1 Notify RN   Glucose, capillary     Status: Abnormal   Collection Time: 08/04/14  8:13 AM  Result Value Ref Range   Glucose-Capillary 218 (H) 70 - 99 mg/dL  Glucose, capillary     Status: Abnormal   Collection Time: 08/04/14 11:37 AM  Result Value Ref Range   Glucose-Capillary 177 (H) 70 - 99 mg/dL  Glucose, capillary     Status: Abnormal   Collection Time: 08/04/14  4:13 PM  Result Value Ref Range   Glucose-Capillary 118 (H) 70 - 99 mg/dL   Comment 1 Notify RN   Glucose, capillary     Status: Abnormal   Collection Time: 08/04/14  8:07 PM  Result Value Ref Range   Glucose-Capillary 180 (H) 70 - 99 mg/dL   Comment 1 Notify RN   Glucose, capillary     Status: Abnormal   Collection Time: 08/05/14 12:55 AM  Result Value Ref Range   Glucose-Capillary 225 (H) 70 - 99 mg/dL   Comment 1 Notify RN   Glucose, capillary     Status: Abnormal   Collection Time: 08/05/14  4:24 AM  Result Value Ref Range   Glucose-Capillary 258 (H) 70 - 99 mg/dL   Comment 1 Notify RN      HEENT: normal Cardio: RRR and no murmur Resp: CTA B/L and unlabored GI: BS positive and no  distension Extremity:  No Edema Skin:   Wound C/D/I Neuro: Alert/Oriented, Normal Sensory and Abnormal Motor 4/5 Bilateral UE, and LE Musc/Skel:  Normal and LB tender Gen NAD Ambulates in the hallway short step length and wide base of support no evidence of toe drag or need stability  Assessment/Plan: 1. Functional deficits secondary to severe debilitation related to gastric cancer and subsequent gastrectomy with jejunostomy tube 07/20/2014  which require 3+ hours per day of interdisciplinary therapy in a comprehensive inpatient rehab setting. Physiatrist is providing close team supervision and 24 hour management of active medical problems listed below. Physiatrist and rehab team continue to assess barriers to discharge/monitor patient progress toward functional and medical goals. FIM: FIM - Bathing Bathing Steps Patient Completed: Chest, Right Arm, Left Arm, Abdomen, Front perineal area, Buttocks, Right upper leg, Left upper leg, Right lower leg (including foot), Left lower leg (including foot) Bathing: 5: Supervision: Safety issues/verbal cues  FIM - Upper Body Dressing/Undressing Upper body dressing/undressing steps patient completed: Thread/unthread right sleeve of pullover shirt/dresss, Thread/unthread left sleeve of pullover shirt/dress, Put head through opening of pull over shirt/dress, Pull shirt over trunk Upper body dressing/undressing: 5: Set-up assist to: Obtain clothing/put away FIM - Lower Body Dressing/Undressing Lower body dressing/undressing steps patient completed: Thread/unthread left underwear leg, Pull underwear up/down, Thread/unthread right pants leg, Thread/unthread left pants leg, Pull pants up/down, Don/Doff right sock, Don/Doff left sock, Thread/unthread right underwear leg Lower body dressing/undressing: 5: Set-up assist to: Obtain clothing  FIM - Toileting Toileting steps completed by patient: Adjust clothing prior to toileting, Performs perineal hygiene, Adjust  clothing after toileting Toileting Assistive Devices: Grab bar or rail for support Toileting: 5: Set-up assist to: Obtain supplies  FIM - Radio producer Devices: Bedside commode Toilet Transfers: 5-From toilet/BSC: Supervision (verbal cues/safety issues), 5-To toilet/BSC: Supervision (verbal cues/safety issues)  FIM - Control and instrumentation engineer Devices: Arm rests, Copy: 5: Supine > Sit: Supervision (verbal cues/safety issues), 5: Sit > Supine: Supervision (verbal cues/safety issues), 5: Bed > Chair or W/C: Supervision (verbal cues/safety issues), 5: Chair or W/C > Bed: Supervision (verbal cues/safety issues)  FIM - Locomotion: Wheelchair Distance: 75 Locomotion: Wheelchair: 0: Activity did not occur (Pt ambulatory on unit) FIM - Locomotion: Ambulation Locomotion: Ambulation Assistive Devices: Walker - Rolling, Other (comment) (rolling walker then no AD) Ambulation/Gait Assistance: 5: Supervision, 4: Min guard Locomotion: Ambulation: 4: Travels 150 ft or more with minimal assistance (Pt.>75%)  Comprehension Comprehension Mode: Auditory Comprehension: 5-Understands  complex 90% of the time/Cues < 10% of the time  Expression Expression Mode: Verbal Expression: 6-Expresses complex ideas: With extra time/assistive device  Social Interaction Social Interaction: 5-Interacts appropriately 90% of the time - Needs monitoring or encouragement for participation or interaction.  Problem Solving Problem Solving: 5-Solves basic 90% of the time/requires cueing < 10% of the time  Memory Memory: 3-Recognizes or recalls 50 - 74% of the time/requires cueing 25 - 49% of the time  Medical Problem List and Plan: 1. Functional deficits secondary to severe debilitation related to gastric cancer and subsequent gastrectomy with jejunostomy tube 07/20/2014, reviewed GE junction esophagram which showed no evidence of anastomotic leak.  Okayed for diet upgrade as tolerated. tolerating clear liquids.patient does not feel like she is ready to advance to full liquids, discussed with Onc Dr Benay Spice who plans outpt 5 FU 2. DVT Prophylaxis/Anticoagulation: SCDs. Monitor for any signs of DVT 3. Pain Management: Hycet as needed. Monitor with increased mobility 4. Mood/restlessness: Check sleep chart. Bed alarm for safety 5. Neuropsych: This patient is capable of making decisions on her own behalf. 6. Skin/Wound Care: Routine skin checks of surgical sites as well as routine turning to maintain skin 7. Fluids/Electrolytes/Nutrition: Strict I and O with follow-up chemistries. Dietary consult for tube feedings.  Low systolic blood pressure today we'll check bmet for hydration status 8. Asymptomatic VT. follow-up cardiology services. Lopressor 25 mg twice a day, Lasix 20 mg daily. Cardiac rate controlled 9. Diabetes mellitus with peripheral neuropathy.uncontrolled Latest hemoglobin A1c 8.1. Lantus insulin 45units daily at bedtime. Check blood sugars before meals and at bedtime. Monitor closely while on tube feeds 10. Hyperlipidemia. Crestor 11. Decreased nutritional storage. Continue nutritional support via jejunostomy tube. Dietary follow-up for cycling of tube feeds. Advancing to clear liquids 12.  HCAP BC neg, finish 7 d abx course with augmentin LOS (Days) 8 A FACE TO FACE EVALUATION WAS PERFORMED  KIRSTEINS,ANDREW E 08/05/2014, 8:13 AM

## 2014-08-05 NOTE — Progress Notes (Signed)
Inpatient Diabetes Program Recommendations  AACE/ADA: New Consensus Statement on Inpatient Glycemice  Control (2013)  Target Ranges:  Prepandial:   less than 140 mg/dL      Peak postprandial:   less than 180 mg/dL (1-2 hours)      Critically ill patients:  140 - 180 mg/dL  As recommended yesterday, please consider addition of tube feed coverage during the time when the  nocturnal tube feeds are being administered.  Please consider adding Novolog tube feed coverage 4 units at 8p, 12 midnight, and 4a to cover CHO in tube feeds. Thank you, Rosita Kea, RN, CNS, Diabetes Coordinator [phone] you, Rosita Kea, RN, CNS, Diabetes Coordinator [phone])

## 2014-08-05 NOTE — Progress Notes (Signed)
Physical Therapy Weekly Progress Note  Patient Details  Name: Tammy Hughes MRN: [id number] Date of Birth: 17-Jun-1933  Beginning of progress report period: July 28, 2014 End of progress report period: August 05, 2014  Today's Date: 08/05/2014 PT Individual Time: 9629-5284 PT Individual Time Calculation (min): 30 min   Patient has met 2 of 5 short term goals. Pt participation in therapies is currently limited by nausea.   Patient continues to demonstrate the following deficits: decreased muscular endurance, limited activity tolerance, decreased dynamic standing balance, decreased balance recovery strategies and therefore will continue to benefit from skilled PT intervention to enhance overall performance with activity tolerance, balance, postural control, attention and coordination.  Patient progressing toward long term goals..  Continue plan of care.  PT Short Term Goals Week 1:  PT Short Term Goal 1 (Week 1): Pt will perform supine<>sit with mod I with HOB flat, no rails. PT Short Term Goal 1 - Progress (Week 1): Progressing toward goal PT Short Term Goal 2 (Week 1): Pt will  transfer from bed<>w/c with supervision using LRAD. PT Short Term Goal 2 - Progress (Week 1): Met PT Short Term Goal 3 (Week 1): Pt will ambulate x150' using LRAD with min guard and no overt LOB. PT Short Term Goal 3 - Progress (Week 1): Discontinued (comment) (LTG addressing w/c mobility D/C'ed) PT Short Term Goal 4 (Week 1): Pt will perform w/c mobility x150' with supervision and 25% cueing. PT Short Term Goal 4 - Progress (Week 1): Not met PT Short Term Goal 5 (Week 1): Pt will negotiate standard curb with LRAD and min A. PT Short Term Goal 5 - Progress (Week 1): Met Week 2:  PT Short Term Goal 1 (Week 2): STG's = LTG's secondary to anticipated LOS.  Skilled Therapeutic Interventions/Progress Updates:    Pt received semi reclined in bed accompanied by son and granddaughter. Pt initially declining PT, but  pt agreeable to attempt OOB activity with encouragement and education. Pt ambulated to/from bathroom with rolling walker and close supervision, increased time. Pt transferred to/from [facility] with supervision and rolling walker. Educated pt on recommendation for personal w/c at D/C based on continued fluctuation in activity tolerance. Pt verbalized understanding and was in full agreement.   Pt declining to continue therapy due to significant nausea. Spoke with RN and PA about fluctuating participation in therapies. Departed with pt semi reclined in bed with 2 rails up, bed alarm on, family present, and all needs within reach.  Therapy Documentation Precautions:  Precautions Precautions: Fall Precaution Comments: jejunostomy tube, gastrostomy Restrictions Weight Bearing Restrictions: No General: PT Amount of Missed Time (min): 30 Minutes PT Missed Treatment Reason: Patient ill (Comment) (nausea) Vital Signs: Therapy Vitals Pulse Rate: 68 BP: (!) 130/58 mmHg Pain: Pain Assessment Pain Assessment: No/denies pain Locomotion : Ambulation Ambulation/Gait Assistance: 5: Supervision (close supervision)   See FIM for current functional status  Therapy/Group: Individual Therapy  Stefano Gaul 08/05/2014, 12:32 PM

## 2014-08-05 NOTE — Progress Notes (Signed)
Physical Therapy Note  Patient Details  Name: MAYCI HANING MRN: [id number] Date of Birth: 1933/02/24 Today's Date: 08/05/2014  Spoke with treatment team about fluctuating patient participation in therapies, decreased activity tolerance due to ongoing nausea. Team in agreement that pt is appropriate to be scheduled for 15/7 to maximize pt participation.   Stefano Gaul 08/05/2014, 12:47 PM

## 2014-08-06 ENCOUNTER — Inpatient Hospital Stay (HOSPITAL_COMMUNITY): Payer: Medicare Other | Admitting: Occupational Therapy

## 2014-08-06 ENCOUNTER — Inpatient Hospital Stay (HOSPITAL_COMMUNITY): Payer: Medicare Other | Admitting: Speech Pathology

## 2014-08-06 LAB — GLUCOSE, CAPILLARY
GLUCOSE-CAPILLARY: 139 mg/dL — AB (ref 70–99)
Glucose-Capillary: 128 mg/dL — ABNORMAL HIGH (ref 70–99)
Glucose-Capillary: 161 mg/dL — ABNORMAL HIGH (ref 70–99)
Glucose-Capillary: 181 mg/dL — ABNORMAL HIGH (ref 70–99)
Glucose-Capillary: 186 mg/dL — ABNORMAL HIGH (ref 70–99)
Glucose-Capillary: 266 mg/dL — ABNORMAL HIGH (ref 70–99)

## 2014-08-06 MED ORDER — ALUM & MAG HYDROXIDE-SIMETH 200-200-20 MG/5 ML NICU TOPICAL
1.0000 "application " | Freq: Four times a day (QID) | TOPICAL | Status: DC | PRN
  Filled 2014-08-06 (×2): qty 355

## 2014-08-06 MED ORDER — ALUM & MAG HYDROXIDE-SIMETH 200-200-20 MG/5ML PO SUSP
30.0000 mL | ORAL | Status: DC | PRN
  Administered 2014-08-06 – 2014-08-09 (×5): 30 mL via ORAL
  Filled 2014-08-06 (×4): qty 30

## 2014-08-06 NOTE — Progress Notes (Signed)
Speech Language Pathology Daily Session Note  Patient Details  Name: Tammy Hughes MRN: [id number] Date of Birth: [birth year]  Today's Date: 08/06/2014 SLP Individual Time: 1430-1500 SLP Individual Time Calculation (min): 30 min  Short Term Goals: Week 1: SLP Short Term Goal 1 (Week 1): short term goals = long term goals   Skilled Therapeutic Interventions:  Pt was seen for skilled ST targeting cognitive goals.  Upon arrival, pt was reclined in bed, awake, alert, and agreeable to participate in [place].  Pt transferred to wheelchair to maximize attention and alertness during structured therapeutic tasks.  SLP facilitated the session with a functional menu planning task targeting recall of basic, familiar information and thought organization.  Pt recalled recipe ingredients and created a shopping list with items sorted into categories based on location in a store with supervision cues.  Pt alternated attention between therapist and the activity in a moderately distracting environment with no cuing needed for redirection.  Continue per current plan of care.     FIM:  Comprehension Comprehension Mode: Auditory Comprehension: 5-Follows basic conversation/direction: With extra time/assistive device Expression Expression Mode: Verbal Expression: 5-Expresses basic needs/ideas: With extra time/assistive device Social Interaction Social Interaction: 5-Interacts appropriately 90% of the time - Needs monitoring or encouragement for participation or interaction. Problem Solving Problem Solving: 5-Solves basic 90% of the time/requires cueing < 10% of the time Memory Memory: 4-Recognizes or recalls 75 - 89% of the time/requires cueing 10 - 24% of the time  Pain Pain Assessment Pain Assessment: No/denies pain  Therapy/Group: Individual Therapy  Sadeen Wiegel, Selinda Orion 08/06/2014, 7:34 PM

## 2014-08-06 NOTE — Progress Notes (Signed)
Patient AAOX4. Small emesis per daughter. Daughter at bedside, verbally aggressive towards staff regarding  "you have to give her zofran. Its ordered. We aren't going back and forth about it" Attempt to explain that patient needs assessed before medicating, and that this nurse has no plan of allowing patient to suffer needlessly.  Patient HOB down, had just had approx 50% of can of ginger ale. Glucerna 1.2 infusing through J-tube @90 /hr. HOB raised, TF stopped (scheduled stop @ 0600). Zofran per J-tube. Patient also complains of "acid" feeling. Explained that having HOB down with TF may have contributed to emesis, and acid reflux taste. Educated that while TF is running to keep [place] elevated. Patient verbalized understanding. Will continue to monitor. Patient uses call light appropriately, able to make needs known.  Lonell Face, RN

## 2014-08-06 NOTE — Progress Notes (Signed)
Occupational Therapy Session Note  Patient Details  Name: Tammy Hughes MRN: [id number] Date of Birth: 06-11-1933  Today's Date: 08/06/2014 OT Individual Time: 1300-1345 OT Individual Time Calculation (min): 45 min    Short Term Goals: Week 2:  OT Short Term Goal 1 (Week 2): STG = LTGs due to remaining LOS  Skilled Therapeutic Interventions/Progress Updates:   Patient seen for functional mobility and therapeutic activities with emphasis on standing balance, standing tolerance, activity tolerance, and postural control, as well as LB dressing retraining.  Patient seated EOB with daughter present upon therapist arrival.  Daughter leaves for lunch.  Patient completes transfer EOB to wheelchair via stand pivot CGA without device.  Patient completes BUE therapeutic activity tolerance task to self propel wheelchair from hospital room to/from therapy gym with increased time and rest breaks.  Patient completes sit to/from stand with CGA, stand balance CGA to SBA, and stand tolerance 5 minutes and 4 minutes while completing 2 handed laundry folding task.  Patient requires moderate rest break between standing trial.  Patient completes LB dressing retraining to doff/don socks by crossing legs with increased time and modified independence.  Patient transfers wheelchair to bed at end of session with CGA to SBA stand pivot without AD.  Daughter present at end of session in hospital room.  Patient with good participation today and is motivated; however, is limited by debility, poor activity tolerance, strength, and overall endurance.  Rehab potential remains good.  Therapy Documentation Precautions:  Precautions Precautions: Fall Precaution Comments: jejunostomy tube, gastrostomy Restrictions Weight Bearing Restrictions: No Pain:  Denies pain  See FIM for current functional status  Therapy/Group: Individual Therapy  Osa Craver 08/06/2014, 1:56 PM

## 2014-08-06 NOTE — Progress Notes (Signed)
81 y.o. right handed female with recent diagnosis of new gastric cancer followed by gastroenterology Dr. Deatra Ina that was incidentally discovered upon endoscopy to follow-up for esophageal stricture. Biopsies positive for adenocarcinoma. Patient independent prior to admission living with her husband. Admitted 07/20/2014 underwent proximal gastrectomy and feeding jejunostomy per Dr. Barry Dienes. Hospital course pain management.     Subjective/Complaints: Had some emesis this morning after drinking ginger ale. Feels like she has some "acid" on her throat. RN reports that patient was nearly supine while drinking and receiving TF. I found the patient supine when I came into room.  Review of Systems - Negative except fatigue Objective: Vital Signs: Blood pressure 142/59, pulse 73, temperature 98.8 F (37.1 C), temperature source Oral, resp. rate 22, height 5' (1.524 m), weight 71.5 kg (157 lb 10.1 oz), SpO2 97 %. No results found. Results for orders placed or performed during the hospital encounter of 07/28/14 (from the past 72 hour(s))  Glucose, capillary     Status: Abnormal   Collection Time: 08/03/14 12:30 PM  Result Value Ref Range   Glucose-Capillary 248 (H) 70 - 99 mg/dL  Glucose, capillary     Status: Abnormal   Collection Time: 08/03/14  4:10 PM  Result Value Ref Range   Glucose-Capillary 213 (H) 70 - 99 mg/dL  Glucose, capillary     Status: Abnormal   Collection Time: 08/03/14  8:26 PM  Result Value Ref Range   Glucose-Capillary 196 (H) 70 - 99 mg/dL  Glucose, capillary     Status: Abnormal   Collection Time: 08/04/14 12:01 AM  Result Value Ref Range   Glucose-Capillary 197 (H) 70 - 99 mg/dL   Comment 1 Notify RN   Glucose, capillary     Status: Abnormal   Collection Time: 08/04/14  3:57 AM  Result Value Ref Range   Glucose-Capillary 241 (H) 70 - 99 mg/dL   Comment 1 Notify RN   Glucose, capillary     Status: Abnormal   Collection Time: 08/04/14  8:13 AM  Result Value Ref Range    Glucose-Capillary 218 (H) 70 - 99 mg/dL  Glucose, capillary     Status: Abnormal   Collection Time: 08/04/14 11:37 AM  Result Value Ref Range   Glucose-Capillary 177 (H) 70 - 99 mg/dL  Glucose, capillary     Status: Abnormal   Collection Time: 08/04/14  4:13 PM  Result Value Ref Range   Glucose-Capillary 118 (H) 70 - 99 mg/dL   Comment 1 Notify RN   Glucose, capillary     Status: Abnormal   Collection Time: 08/04/14  8:07 PM  Result Value Ref Range   Glucose-Capillary 180 (H) 70 - 99 mg/dL   Comment 1 Notify RN   Glucose, capillary     Status: Abnormal   Collection Time: 08/05/14 12:55 AM  Result Value Ref Range   Glucose-Capillary 225 (H) 70 - 99 mg/dL   Comment 1 Notify RN   Glucose, capillary     Status: Abnormal   Collection Time: 08/05/14  4:24 AM  Result Value Ref Range   Glucose-Capillary 258 (H) 70 - 99 mg/dL   Comment 1 Notify RN   Glucose, capillary     Status: Abnormal   Collection Time: 08/05/14  8:21 AM  Result Value Ref Range   Glucose-Capillary 207 (H) 70 - 99 mg/dL  Basic metabolic panel     Status: Abnormal   Collection Time: 08/05/14  9:10 AM  Result Value Ref Range   Sodium 136  135 - 145 mmol/L   Potassium 4.5 3.5 - 5.1 mmol/L   Chloride 98 96 - 112 mEq/L   CO2 28 19 - 32 mmol/L   Glucose, Bld 218 (H) 70 - 99 mg/dL   BUN 15 6 - 23 mg/dL   Creatinine, Ser 0.98 0.50 - 1.10 mg/dL   Calcium 9.7 8.4 - 10.5 mg/dL   GFR calc non Af Amer 53 (L) >90 mL/min   GFR calc Af Amer 62 (L) >90 mL/min    Comment: (NOTE) The eGFR has been calculated using the CKD EPI equation. This calculation has not been validated in all clinical situations. eGFR's persistently <90 mL/min signify possible Chronic Kidney Disease.    Anion gap 10 5 - 15  Glucose, capillary     Status: Abnormal   Collection Time: 08/05/14 11:50 AM  Result Value Ref Range   Glucose-Capillary 130 (H) 70 - 99 mg/dL  Glucose, capillary     Status: None   Collection Time: 08/05/14  4:40 PM  Result  Value Ref Range   Glucose-Capillary 85 70 - 99 mg/dL  Glucose, capillary     Status: Abnormal   Collection Time: 08/05/14  8:06 PM  Result Value Ref Range   Glucose-Capillary 157 (H) 70 - 99 mg/dL   Comment 1 Notify RN   Glucose, capillary     Status: Abnormal   Collection Time: 08/05/14 11:59 PM  Result Value Ref Range   Glucose-Capillary 186 (H) 70 - 99 mg/dL   Comment 1 Notify RN   Glucose, capillary     Status: Abnormal   Collection Time: 08/06/14  4:10 AM  Result Value Ref Range   Glucose-Capillary 266 (H) 70 - 99 mg/dL   Comment 1 Notify RN   Glucose, capillary     Status: Abnormal   Collection Time: 08/06/14  8:23 AM  Result Value Ref Range   Glucose-Capillary 181 (H) 70 - 99 mg/dL   Comment 1 Notify RN      HEENT: normal Cardio: RRR and no murmur Resp: CTA B/L and unlabored GI: BS positive and no distension Extremity:  No Edema Skin:   Wound C/D/I Neuro: Alert/Oriented, Normal Sensory and Abnormal Motor 4/5 Bilateral UE, and LE Musc/Skel:  Normal and LB tender Gen NAD, lying supine in bed  Assessment/Plan: 1. Functional deficits secondary to severe debilitation related to gastric cancer and subsequent gastrectomy with jejunostomy tube 07/20/2014  which require 3+ hours per day of interdisciplinary therapy in a comprehensive inpatient rehab setting. Physiatrist is providing close team supervision and 24 hour management of active medical problems listed below. Physiatrist and rehab team continue to assess barriers to discharge/monitor patient progress toward functional and medical goals. FIM: FIM - Bathing Bathing Steps Patient Completed: Chest, Right Arm, Left Arm, Abdomen Bathing: 5: Set-up assist to: Obtain items  FIM - Upper Body Dressing/Undressing Upper body dressing/undressing steps patient completed: Thread/unthread right sleeve of pullover shirt/dresss, Thread/unthread left sleeve of pullover shirt/dress, Put head through opening of pull over shirt/dress,  Pull shirt over trunk Upper body dressing/undressing: 5: Set-up assist to: Obtain clothing/put away FIM - Lower Body Dressing/Undressing Lower body dressing/undressing steps patient completed: Thread/unthread left underwear leg, Pull underwear up/down, Thread/unthread right pants leg, Thread/unthread left pants leg, Pull pants up/down, Don/Doff right sock, Don/Doff left sock, Thread/unthread right underwear leg Lower body dressing/undressing: 5: Set-up assist to: Obtain clothing  FIM - Toileting Toileting steps completed by patient: Adjust clothing prior to toileting, Performs perineal hygiene, Adjust clothing after toileting  Toileting Assistive Devices: Grab bar or rail for support Toileting: 5: Set-up assist to: Obtain supplies  FIM - Radio producer Devices: Recruitment consultant Transfers: 5-From toilet/BSC: Supervision (verbal cues/safety issues), 5-To toilet/BSC: Supervision (verbal cues/safety issues)  FIM - Control and instrumentation engineer Devices: Arm rests, Walker, HOB elevated Bed/Chair Transfer: 5: Supine > Sit: Supervision (verbal cues/safety issues), 5: Sit > Supine: Supervision (verbal cues/safety issues)  FIM - Locomotion: Wheelchair Distance: 75 Locomotion: Wheelchair: 0: Activity did not occur FIM - Locomotion: Ambulation Locomotion: Ambulation Assistive Devices: Administrator Ambulation/Gait Assistance: 5: Supervision (close supervision) Locomotion: Ambulation: 1: Travels less than 50 ft with supervision/safety issues  Comprehension Comprehension Mode: Auditory Comprehension: 5-Follows basic conversation/direction: With extra time/assistive device  Expression Expression Mode: Verbal Expression: 5-Expresses basic needs/ideas: With extra time/assistive device  Social Interaction Social Interaction: 4-Interacts appropriately 75 - 89% of the time - Needs redirection for appropriate language or to initiate  interaction.  Problem Solving Problem Solving: 4-Solves basic 75 - 89% of the time/requires cueing 10 - 24% of the time  Memory Memory: 3-Recognizes or recalls 50 - 74% of the time/requires cueing 25 - 49% of the time  Medical Problem List and Plan: 1. Functional deficits secondary to severe debilitation related to gastric cancer and subsequent gastrectomy with jejunostomy tube 07/20/2014, reviewed GE junction esophagram which showed no evidence of anastomotic leak. Okayed for diet upgrade as tolerated. tolerating clear liquids.patient does not feel like she is ready to advance to full liquids, discussed with Onc Dr Benay Spice who plans outpt 5 FU 2. DVT Prophylaxis/Anticoagulation: SCDs. Monitor for any signs of DVT 3. Pain Management: Hycet as needed. Monitor with increased mobility 4. Mood/restlessness: Check sleep chart. Bed alarm for safety 5. Neuropsych: This patient is capable of making decisions on her own behalf. 6. Skin/Wound Care: Routine skin checks of surgical sites as well as routine turning to maintain skin 7. Fluids/Electrolytes/Nutrition: Strict I and O with follow-up chemistries. Dietary consult for tube feedings.  Low systolic blood pressure today we'll check bmet for hydration status  -recommended keeping HOB at 30 degrees while receiving TF  -maalox prn for GERD 8. Asymptomatic VT. follow-up cardiology services. Lopressor 25 mg twice a day, Lasix 20 mg daily. Cardiac rate controlled 9. Diabetes mellitus with peripheral neuropathy.uncontrolled Latest hemoglobin A1c 8.1. Lantus insulin 45units daily at bedtime. Check blood sugars before meals and at bedtime. Monitor closely while on tube feeds 10. Hyperlipidemia. Crestor 11. Decreased nutritional storage. Continue nutritional support via jejunostomy tube. Dietary follow-up for cycling of tube feeds. Advancing to clear liquids 12.  HCAP BC neg, finish 7 d abx course with augmentin    LOS (Days) 9 A FACE TO FACE EVALUATION  WAS PERFORMED  Dagmawi Venable T 08/06/2014, 11:53 AM

## 2014-08-07 ENCOUNTER — Inpatient Hospital Stay (HOSPITAL_COMMUNITY): Payer: Medicare Other | Admitting: *Deleted

## 2014-08-07 LAB — GLUCOSE, CAPILLARY
GLUCOSE-CAPILLARY: 235 mg/dL — AB (ref 70–99)
GLUCOSE-CAPILLARY: 160 mg/dL — AB (ref 70–99)
Glucose-Capillary: 109 mg/dL — ABNORMAL HIGH (ref 70–99)
Glucose-Capillary: 123 mg/dL — ABNORMAL HIGH (ref 70–99)
Glucose-Capillary: 199 mg/dL — ABNORMAL HIGH (ref 70–99)
Glucose-Capillary: 216 mg/dL — ABNORMAL HIGH (ref 70–99)
Glucose-Capillary: 239 mg/dL — ABNORMAL HIGH (ref 70–99)

## 2014-08-07 NOTE — Progress Notes (Signed)
81 y.o. right handed female with recent diagnosis of new gastric cancer followed by gastroenterology Dr. Deatra Ina that was incidentally discovered upon endoscopy to follow-up for esophageal stricture. Biopsies positive for adenocarcinoma. Patient independent prior to admission living with her husband. Admitted 07/20/2014 underwent proximal gastrectomy and feeding jejunostomy per Dr. Barry Dienes. Hospital course pain management.     Subjective/Complaints:   Denies nausea today. No Reflux. Slept well. Daughter asked when we would upgrade her to more substantial PO.  Review of Systems - Negative except fatigue Objective: Vital Signs: Blood pressure 120/54, pulse 69, temperature 98.1 F (36.7 C), temperature source Oral, resp. rate 17, height 5' (1.524 m), weight 67.4 kg (148 lb 9.4 oz), SpO2 98 %. No results found. Results for orders placed or performed during the hospital encounter of 07/28/14 (from the past 72 hour(s))  Glucose, capillary     Status: Abnormal   Collection Time: 08/04/14 11:37 AM  Result Value Ref Range   Glucose-Capillary 177 (H) 70 - 99 mg/dL  Glucose, capillary     Status: Abnormal   Collection Time: 08/04/14  4:13 PM  Result Value Ref Range   Glucose-Capillary 118 (H) 70 - 99 mg/dL   Comment 1 Notify RN   Glucose, capillary     Status: Abnormal   Collection Time: 08/04/14  8:07 PM  Result Value Ref Range   Glucose-Capillary 180 (H) 70 - 99 mg/dL   Comment 1 Notify RN   Glucose, capillary     Status: Abnormal   Collection Time: 08/05/14 12:55 AM  Result Value Ref Range   Glucose-Capillary 225 (H) 70 - 99 mg/dL   Comment 1 Notify RN   Glucose, capillary     Status: Abnormal   Collection Time: 08/05/14  4:24 AM  Result Value Ref Range   Glucose-Capillary 258 (H) 70 - 99 mg/dL   Comment 1 Notify RN   Glucose, capillary     Status: Abnormal   Collection Time: 08/05/14  8:21 AM  Result Value Ref Range   Glucose-Capillary 207 (H) 70 - 99 mg/dL  Basic metabolic panel      Status: Abnormal   Collection Time: 08/05/14  9:10 AM  Result Value Ref Range   Sodium 136 135 - 145 mmol/L   Potassium 4.5 3.5 - 5.1 mmol/L   Chloride 98 96 - 112 mEq/L   CO2 28 19 - 32 mmol/L   Glucose, Bld 218 (H) 70 - 99 mg/dL   BUN 15 6 - 23 mg/dL   Creatinine, Ser 0.98 0.50 - 1.10 mg/dL   Calcium 9.7 8.4 - 10.5 mg/dL   GFR calc non Af Amer 53 (L) >90 mL/min   GFR calc Af Amer 62 (L) >90 mL/min    Comment: (NOTE) The eGFR has been calculated using the CKD EPI equation. This calculation has not been validated in all clinical situations. eGFR's persistently <90 mL/min signify possible Chronic Kidney Disease.    Anion gap 10 5 - 15  Glucose, capillary     Status: Abnormal   Collection Time: 08/05/14 11:50 AM  Result Value Ref Range   Glucose-Capillary 130 (H) 70 - 99 mg/dL  Glucose, capillary     Status: None   Collection Time: 08/05/14  4:40 PM  Result Value Ref Range   Glucose-Capillary 85 70 - 99 mg/dL  Glucose, capillary     Status: Abnormal   Collection Time: 08/05/14  8:06 PM  Result Value Ref Range   Glucose-Capillary 157 (H) 70 - 99 mg/dL  Comment 1 Notify RN   Glucose, capillary     Status: Abnormal   Collection Time: 08/05/14 11:59 PM  Result Value Ref Range   Glucose-Capillary 186 (H) 70 - 99 mg/dL   Comment 1 Notify RN   Glucose, capillary     Status: Abnormal   Collection Time: 08/06/14  4:10 AM  Result Value Ref Range   Glucose-Capillary 266 (H) 70 - 99 mg/dL   Comment 1 Notify RN   Glucose, capillary     Status: Abnormal   Collection Time: 08/06/14  8:23 AM  Result Value Ref Range   Glucose-Capillary 181 (H) 70 - 99 mg/dL   Comment 1 Notify RN   Glucose, capillary     Status: Abnormal   Collection Time: 08/06/14 12:03 PM  Result Value Ref Range   Glucose-Capillary 161 (H) 70 - 99 mg/dL   Comment 1 Notify RN   Glucose, capillary     Status: Abnormal   Collection Time: 08/06/14  4:19 PM  Result Value Ref Range   Glucose-Capillary 139 (H) 70 - 99  mg/dL   Comment 1 Notify RN   Glucose, capillary     Status: Abnormal   Collection Time: 08/06/14  8:43 PM  Result Value Ref Range   Glucose-Capillary 128 (H) 70 - 99 mg/dL   Comment 1 Notify RN   Glucose, capillary     Status: Abnormal   Collection Time: 08/07/14 12:33 AM  Result Value Ref Range   Glucose-Capillary 216 (H) 70 - 99 mg/dL   Comment 1 Notify RN   Glucose, capillary     Status: Abnormal   Collection Time: 08/07/14  8:17 AM  Result Value Ref Range   Glucose-Capillary 235 (H) 70 - 99 mg/dL     HEENT: normal Cardio: RRR and no murmur Resp: CTA B/L and unlabored GI: BS positive and no distension Extremity:  No Edema Skin:   Wound C/D/I Neuro: Alert/Oriented, Normal Sensory and Abnormal Motor 4/5 Bilateral UE, and LE Musc/Skel:  Normal and LB tender Gen NAD, lying with head slightly elevated in bed  Assessment/Plan: 1. Functional deficits secondary to severe debilitation related to gastric cancer and subsequent gastrectomy with jejunostomy tube 07/20/2014  which require 3+ hours per day of interdisciplinary therapy in a comprehensive inpatient rehab setting. Physiatrist is providing close team supervision and 24 hour management of active medical problems listed below. Physiatrist and rehab team continue to assess barriers to discharge/monitor patient progress toward functional and medical goals. FIM: FIM - Bathing Bathing Steps Patient Completed: Chest, Right Arm, Left Arm, Abdomen Bathing: 5: Set-up assist to: Obtain items  FIM - Upper Body Dressing/Undressing Upper body dressing/undressing steps patient completed: Thread/unthread right sleeve of pullover shirt/dresss, Thread/unthread left sleeve of pullover shirt/dress, Put head through opening of pull over shirt/dress, Pull shirt over trunk Upper body dressing/undressing: 5: Set-up assist to: Obtain clothing/put away FIM - Lower Body Dressing/Undressing Lower body dressing/undressing steps patient completed:  Thread/unthread left underwear leg, Pull underwear up/down, Thread/unthread right pants leg, Thread/unthread left pants leg, Pull pants up/down, Don/Doff right sock, Don/Doff left sock, Thread/unthread right underwear leg Lower body dressing/undressing: 5: Set-up assist to: Obtain clothing  FIM - Toileting Toileting steps completed by patient: Adjust clothing prior to toileting, Performs perineal hygiene, Adjust clothing after toileting Toileting Assistive Devices: Grab bar or rail for support Toileting: 4: Steadying assist  FIM - Radio producer Devices: Bedside commode Toilet Transfers: 5-From toilet/BSC: Supervision (verbal cues/safety issues), 5-To toilet/BSC: Supervision (verbal  cues/safety issues)  FIM - Control and instrumentation engineer Devices: Arm rests, Walker, HOB elevated Bed/Chair Transfer: 5: Supine > Sit: Supervision (verbal cues/safety issues), 5: Sit > Supine: Supervision (verbal cues/safety issues)  FIM - Locomotion: Wheelchair Distance: 75 Locomotion: Wheelchair: 0: Activity did not occur FIM - Locomotion: Ambulation Locomotion: Ambulation Assistive Devices: Administrator Ambulation/Gait Assistance: 5: Supervision (close supervision) Locomotion: Ambulation: 1: Travels less than 50 ft with supervision/safety issues  Comprehension Comprehension Mode: Auditory Comprehension: 5-Follows basic conversation/direction: With extra time/assistive device  Expression Expression Mode: Verbal Expression: 5-Expresses basic needs/ideas: With extra time/assistive device  Social Interaction Social Interaction: 5-Interacts appropriately 90% of the time - Needs monitoring or encouragement for participation or interaction.  Problem Solving Problem Solving: 5-Solves basic 90% of the time/requires cueing < 10% of the time  Memory Memory: 4-Recognizes or recalls 75 - 89% of the time/requires cueing 10 - 24% of the time  Medical Problem  List and Plan: 1. Functional deficits secondary to severe debilitation related to gastric cancer and subsequent gastrectomy with jejunostomy tube 07/20/2014, reviewed GE junction esophagram which showed no evidence of anastomotic leak. Okayed for diet upgrade as tolerated. tolerating clear liquids.patient does not feel like she is ready to advance to full liquids, discussed with Onc Dr Benay Spice who plans outpt 5 FU 2. DVT Prophylaxis/Anticoagulation: SCDs. Monitor for any signs of DVT 3. Pain Management: Hycet as needed. Monitor with increased mobility 4. Mood/restlessness: Check sleep chart. Bed alarm for safety 5. Neuropsych: This patient is capable of making decisions on her own behalf. 6. Skin/Wound Care: Routine skin checks of surgical sites as well as routine turning to maintain skin 7. Fluids/Electrolytes/Nutrition: Strict I and O with follow-up chemistries.   -Clear liquids with supplemental TF  -recommended keeping HOB at 30 degrees while receiving TF  -maalox prn for GERD  -she's just now tolerating the clears---would be in no hurry to progress diet 8. Asymptomatic VT. follow-up cardiology services. Lopressor 25 mg twice a day, Lasix 20 mg daily. Cardiac rate controlled 9. Diabetes mellitus with peripheral neuropathy.some improvement  - Lantus insulin 40units daily at bedtime.    - Monitor closely while on tube feeds 10. Hyperlipidemia. Crestor 11. Decreased nutritional storage. Continue nutritional support via jejunostomy tube. Dietary follow-up for cycling of tube feeds. Advancing to clear liquids 12.  HCAP BC neg, finish 7 d abx course with augmentin    LOS (Days) 10 A FACE TO FACE EVALUATION WAS PERFORMED  SWARTZ,ZACHARY T 08/07/2014, 8:52 AM

## 2014-08-08 ENCOUNTER — Inpatient Hospital Stay (HOSPITAL_COMMUNITY): Payer: Medicare Other | Admitting: Speech Pathology

## 2014-08-08 ENCOUNTER — Inpatient Hospital Stay (HOSPITAL_COMMUNITY): Payer: Medicare Other | Admitting: Physical Therapy

## 2014-08-08 ENCOUNTER — Inpatient Hospital Stay (HOSPITAL_COMMUNITY): Payer: Medicare Other | Admitting: *Deleted

## 2014-08-08 LAB — GLUCOSE, CAPILLARY
GLUCOSE-CAPILLARY: 144 mg/dL — AB (ref 70–99)
Glucose-Capillary: 255 mg/dL — ABNORMAL HIGH (ref 70–99)
Glucose-Capillary: 196 mg/dL — ABNORMAL HIGH (ref 70–99)
Glucose-Capillary: 229 mg/dL — ABNORMAL HIGH (ref 70–99)
Glucose-Capillary: 219 mg/dL — ABNORMAL HIGH (ref 70–99)

## 2014-08-08 NOTE — Progress Notes (Signed)
Social Work Patient ID: Tammy Hughes, female   DOB: [birth year], 81 y.o.   MRN: [id number] Met with daughter to inform equipment ordered and should be contacting her regarding delivery to pt's home. Spoke with [facility] asking it all goes to pt's home tomorrow.  Stacy-RN to show daughter tube feeds and connecting and disconnecting.

## 2014-08-08 NOTE — Progress Notes (Signed)
81 y.o. right handed female with recent diagnosis of new gastric cancer followed by gastroenterology Dr. Deatra Ina that was incidentally discovered upon endoscopy to follow-up for esophageal stricture. Biopsies positive for adenocarcinoma. Patient independent prior to admission living with her husband. Admitted 07/20/2014 underwent proximal gastrectomy and feeding jejunostomy per Dr. Barry Dienes. Hospital course pain management.     Subjective/Complaints:   Denies nausea today. Pt tolerates clear liquid , up for trying full liquid today  Review of Systems - Negative except fatigue Objective: Vital Signs: Blood pressure 130/56, pulse 63, temperature 97.8 F (36.6 C), temperature source Oral, resp. rate 18, height 5' (1.524 m), weight 69.7 kg (153 lb 10.6 oz), SpO2 97 %. No results found. Results for orders placed or performed during the hospital encounter of 07/28/14 (from the past 72 hour(s))  Glucose, capillary     Status: Abnormal   Collection Time: 08/05/14 11:50 AM  Result Value Ref Range   Glucose-Capillary 130 (H) 70 - 99 mg/dL  Glucose, capillary     Status: None   Collection Time: 08/05/14  4:40 PM  Result Value Ref Range   Glucose-Capillary 85 70 - 99 mg/dL  Glucose, capillary     Status: Abnormal   Collection Time: 08/05/14  8:06 PM  Result Value Ref Range   Glucose-Capillary 157 (H) 70 - 99 mg/dL   Comment 1 Notify RN   Glucose, capillary     Status: Abnormal   Collection Time: 08/05/14 11:59 PM  Result Value Ref Range   Glucose-Capillary 186 (H) 70 - 99 mg/dL   Comment 1 Notify RN   Glucose, capillary     Status: Abnormal   Collection Time: 08/06/14  4:10 AM  Result Value Ref Range   Glucose-Capillary 266 (H) 70 - 99 mg/dL   Comment 1 Notify RN   Glucose, capillary     Status: Abnormal   Collection Time: 08/06/14  8:23 AM  Result Value Ref Range   Glucose-Capillary 181 (H) 70 - 99 mg/dL   Comment 1 Notify RN   Glucose, capillary     Status: Abnormal   Collection Time:  08/06/14 12:03 PM  Result Value Ref Range   Glucose-Capillary 161 (H) 70 - 99 mg/dL   Comment 1 Notify RN   Glucose, capillary     Status: Abnormal   Collection Time: 08/06/14  4:19 PM  Result Value Ref Range   Glucose-Capillary 139 (H) 70 - 99 mg/dL   Comment 1 Notify RN   Glucose, capillary     Status: Abnormal   Collection Time: 08/06/14  8:43 PM  Result Value Ref Range   Glucose-Capillary 128 (H) 70 - 99 mg/dL   Comment 1 Notify RN   Glucose, capillary     Status: Abnormal   Collection Time: 08/07/14 12:33 AM  Result Value Ref Range   Glucose-Capillary 216 (H) 70 - 99 mg/dL   Comment 1 Notify RN   Glucose, capillary     Status: Abnormal   Collection Time: 08/07/14  4:09 AM  Result Value Ref Range   Glucose-Capillary 239 (H) 70 - 99 mg/dL   Comment 1 Notify RN   Glucose, capillary     Status: Abnormal   Collection Time: 08/07/14  8:17 AM  Result Value Ref Range   Glucose-Capillary 235 (H) 70 - 99 mg/dL  Glucose, capillary     Status: Abnormal   Collection Time: 08/07/14 12:19 PM  Result Value Ref Range   Glucose-Capillary 160 (H) 70 - 99 mg/dL  Glucose, capillary     Status: Abnormal   Collection Time: 08/07/14  4:03 PM  Result Value Ref Range   Glucose-Capillary 123 (H) 70 - 99 mg/dL  Glucose, capillary     Status: Abnormal   Collection Time: 08/07/14  8:05 PM  Result Value Ref Range   Glucose-Capillary 109 (H) 70 - 99 mg/dL   Comment 1 Notify RN   Glucose, capillary     Status: Abnormal   Collection Time: 08/07/14 11:56 PM  Result Value Ref Range   Glucose-Capillary 199 (H) 70 - 99 mg/dL   Comment 1 Notify RN   Glucose, capillary     Status: Abnormal   Collection Time: 08/08/14  4:06 AM  Result Value Ref Range   Glucose-Capillary 255 (H) 70 - 99 mg/dL   Comment 1 Notify RN   Glucose, capillary     Status: Abnormal   Collection Time: 08/08/14  8:27 AM  Result Value Ref Range   Glucose-Capillary 196 (H) 70 - 99 mg/dL     HEENT: normal Cardio: RRR and no  murmur Resp: CTA B/L and unlabored GI: BS positive and no distension Extremity:  No Edema Skin:   Wound C/D/I Neuro: Alert/Oriented, Normal Sensory and Abnormal Motor 4/5 Bilateral UE, and LE Musc/Skel:  Normal and LB tender Gen NAD, lying with head slightly elevated in bed  Assessment/Plan: 1. Functional deficits secondary to severe debilitation related to gastric cancer and subsequent gastrectomy with jejunostomy tube 07/20/2014  which require 3+ hours per day of interdisciplinary therapy in a comprehensive inpatient rehab setting. Physiatrist is providing close team supervision and 24 hour management of active medical problems listed below. Physiatrist and rehab team continue to assess barriers to discharge/monitor patient progress toward functional and medical goals. FIM: FIM - Bathing Bathing Steps Patient Completed: Chest, Right Arm, Left Arm, Abdomen Bathing: 5: Set-up assist to: Obtain items  FIM - Upper Body Dressing/Undressing Upper body dressing/undressing steps patient completed: Thread/unthread right sleeve of pullover shirt/dresss, Thread/unthread left sleeve of pullover shirt/dress, Put head through opening of pull over shirt/dress, Pull shirt over trunk Upper body dressing/undressing: 5: Set-up assist to: Obtain clothing/put away FIM - Lower Body Dressing/Undressing Lower body dressing/undressing steps patient completed: Thread/unthread left underwear leg, Pull underwear up/down, Thread/unthread right pants leg, Thread/unthread left pants leg, Pull pants up/down, Don/Doff right sock, Don/Doff left sock, Thread/unthread right underwear leg Lower body dressing/undressing: 5: Set-up assist to: Obtain clothing  FIM - Toileting Toileting steps completed by patient: Adjust clothing prior to toileting, Performs perineal hygiene, Adjust clothing after toileting Toileting Assistive Devices: Grab bar or rail for support Toileting: 4: Steadying assist  FIM - Glass blower/designer Devices: Recruitment consultant Transfers: 5-From toilet/BSC: Supervision (verbal cues/safety issues), 5-To toilet/BSC: Supervision (verbal cues/safety issues)  FIM - Control and instrumentation engineer Devices: Arm rests, Walker, HOB elevated Bed/Chair Transfer: 5: Supine > Sit: Supervision (verbal cues/safety issues), 5: Sit > Supine: Supervision (verbal cues/safety issues)  FIM - Locomotion: Wheelchair Distance: 75 Locomotion: Wheelchair: 0: Activity did not occur FIM - Locomotion: Ambulation Locomotion: Ambulation Assistive Devices: Administrator Ambulation/Gait Assistance: 5: Supervision (close supervision) Locomotion: Ambulation: 1: Travels less than 50 ft with supervision/safety issues  Comprehension Comprehension Mode: Auditory Comprehension: 5-Follows basic conversation/direction: With no assist  Expression Expression Mode: Verbal Expression: 5-Expresses basic needs/ideas: With extra time/assistive device  Social Interaction Social Interaction: 5-Interacts appropriately 90% of the time - Needs monitoring or encouragement for participation or interaction.  Problem Solving Problem  Solving: 5-Solves basic 90% of the time/requires cueing < 10% of the time  Memory Memory: 4-Recognizes or recalls 75 - 89% of the time/requires cueing 10 - 24% of the time  Medical Problem List and Plan: 1. Functional deficits secondary to severe debilitation related to gastric cancer and subsequent gastrectomy with jejunostomy tube 07/20/2014, reviewed GE junction esophagram which showed no evidence of anastomotic leak. Okayed for diet upgrade as tolerated. tolerating clear liquids.patient does not feel like she is ready to advance to full liquids, discussed with Onc Dr Benay Spice who plans outpt 5 FU 2. DVT Prophylaxis/Anticoagulation: SCDs. Monitor for any signs of DVT 3. Pain Management: Hycet as needed. Monitor with increased mobility 4.  Mood/restlessness: Check sleep chart. Bed alarm for safety 5. Neuropsych: This patient is capable of making decisions on her own behalf. 6. Skin/Wound Care: Routine skin checks of surgical sites as well as routine turning to maintain skin 7. Fluids/Electrolytes/Nutrition: Strict I and O with follow-up chemistries.   -Trial full  liquids with supplemental TF  -recommended keeping HOB at 30 degrees while receiving TF  -maalox prn for GERD  - 8. Asymptomatic VT. follow-up cardiology services. Lopressor 25 mg twice a day, Lasix 20 mg daily. Cardiac rate controlled 9. Diabetes mellitus with peripheral neuropathy.some improvement  - Lantus insulin 40units daily at bedtime.    - Monitor closely while on tube feeds 10. Hyperlipidemia. Crestor 11. Decreased nutritional storage. Continue nutritional support via jejunostomy tube. Dietary follow-up for cycling of tube feeds. Advancing to clear liquids 12.  HCAP BC neg,D/C augmentin, monitor temp    LOS (Days) 11 A FACE TO FACE EVALUATION WAS PERFORMED  Adella Manolis E 08/08/2014, 9:38 AM

## 2014-08-08 NOTE — Progress Notes (Signed)
Occupational Therapy Session Note  Patient Details  Name: Tammy Hughes MRN: [id number] Date of Birth: November 05, 1932  Today's Date: 08/08/2014 OT Individual Time:  -   1100-1200  (60 min)      Short Term Goals:  Skilled Therapeutic Interventions/Progress Updates:    Addressed standing balance, transfers, sit to stand.  Pt. Agreed to shower but then changed mind to do sponge bath.  Had pt sponge bath on tub transfer bench with basin and all needs set up.  Pt went from sit to stand with SBA and stood for 2 minutes.  She transferred to toilet and had small BM.  Pt. Elected to transfer back to bed at end of session due to fatigue.    Therapy Documentation Precautions:  Precautions Precautions: Fall Precaution Comments: jejunostomy tube, gastrostomy Restrictions Weight Bearing Restrictions: No General:   Vital Signs: Therapy Vitals Pulse Rate: 63 BP: (!) 130/56 mmHg Pain:  none             See FIM for current functional status  Therapy/Group: Individual Therapy  Lisa Roca 08/08/2014, 11:04 AM

## 2014-08-08 NOTE — Progress Notes (Signed)
Social Work Patient ID: Tammy Hughes, female   DOB: 01-Mar-1933, 81 y.o.   MRN: [id number] Met with pt and husband to inform equipment referral made to [facility] to be contacted for delivery. [facility] to provide the home health follow up. Spoke with Stacy-RN to begin family training on j-tube feedings and connecting and disconnecting. Work toward discharge on Wed.

## 2014-08-08 NOTE — Progress Notes (Signed)
Physical Therapy Session Note  Patient Details  Name: Tammy Hughes MRN: [id number] Date of Birth: 29-Oct-1932  Today's Date: 08/08/2014 PT Individual Time: 0930-1030 PT Individual Time Calculation (min): 60 min   Short Term Goals: Week 2:  PT Short Term Goal 1 (Week 2): STG's = LTG's secondary to anticipated LOS.  Skilled Therapeutic Interventions/Progress Updates:    Pt received semi reclined in bed accompanied by daughter, Tammy Hughes. Pt agreeable to therapy. Session focused on hands-on training with daughter as well as community mobility. Daughter reports that husband will be providing supervision/assist with majority of mobility, but that daughter will assist with short-distance ambulation and going to/from bathroom. This PT explained and demonstrated safe tehcnique for providing supervision and appropriate cueing with the following: sit<>stand transfer with rolling walker and supervision, verbal cueing for safe hand placement; ambulation x15' in controlled environment with rolling walker and supervision; toilet transfer with supervision and rolling walker, verbal cueing for safe positioning/proximity to toilet prior to sitting. Daughter gave effective return demonstration of providing supervision with all mobility described above and provided setup assist with toileting. Remainder of session focused on community mobility. Pt performed functional mobility x210' in controlled, home, and community environments with rolling walker and supervision. In hospital solarium, pt performed sit<>stand transfers from furniture and ambulated over door sills, surface changes with rolling walker and supervision with no overt LOB. Pt performed w/c mobility x100' in community environment with bilat UE's and supervision, increased time. Session ended in pt room, where pt was left seated in w/c with all needs within reach.  Therapy Documentation Precautions:  Precautions Precautions: Fall Precaution Comments:  jejunostomy tube, gastrostomy Restrictions Weight Bearing Restrictions: No Vital Signs: Therapy Vitals Pulse Rate: 63 BP: (!) 130/56 mmHg Pain: Pain Assessment Pain Assessment: No/denies pain Pain Score: 0-No pain Mobility:   Locomotion : Ambulation Ambulation/Gait Assistance: 5: Supervision Wheelchair Mobility Distance: 100   See FIM for current functional status  Therapy/Group: Individual Therapy  [place], Malva Cogan 08/08/2014, 12:17 PM

## 2014-08-08 NOTE — Progress Notes (Signed)
Inpatient Diabetes Program Recommendations  AACE/ADA: New Consensus Statement on Inpatient Glycemic Control (2013)  Target Ranges:  Prepandial:   less than 140 mg/dL      Peak postprandial:   less than 180 mg/dL (1-2 hours)      Critically ill patients:  140 - 180 mg/dL     Results for Tammy Hughes, Tammy Hughes (MRN [id number]) as of 08/08/2014 11:10  Ref. Range 08/07/2014 00:33 08/07/2014 04:09 08/07/2014 08:17 08/07/2014 12:19 08/07/2014 16:03 08/07/2014 20:05  Glucose-Capillary Latest Range: 70-99 mg/dL 216 (H) 239 (H) 235 (H) 160 (H) 123 (H) 109 (H)    Results for Tammy Hughes, Tammy Hughes (MRN [id number]) as of 08/08/2014 11:10  Ref. Range 08/07/2014 23:56 08/08/2014 04:06 08/08/2014 08:27  Glucose-Capillary Latest Range: 70-99 mg/dL 199 (H) 255 (H) 196 (H)     Current Orders: Lantus 40 units QHS     Novolog Resistant SSI Q4 hours   **Note patient is receiving Glucerna tube feeds at 90 cc/hour during the night hours of 6pm-7am.  **Patient is having glucose elevations during the time she receives her tube feedings at night.    MD- Please consider the following:  Please add Novolog tube feed coverage to be given Q4 hours during the night- Novolog 4 units at 8pm, Midnight, and 4am only (hold if tube feedings held for any reason)  To cover the carbohydrates in the tube feedings     Will follow Wyn Quaker RN, MSN, CDE Diabetes Coordinator Inpatient Diabetes Program Team Pager: [phone] (8a-10p)

## 2014-08-08 NOTE — Progress Notes (Signed)
Speech Language Pathology Daily Session Note  Patient Details  Name: Tammy Hughes MRN: [id number] Date of Birth: Jul 20, 1932  Today's Date: 08/08/2014 SLP Individual Time: 1500-1600 SLP Individual Time Calculation (min): 60 min  Short Term Goals: Week 1: SLP Short Term Goal 1 (Week 1): short term goals = long term goals   Skilled Therapeutic Interventions: Skilled treatment session focused on addressing cognition goals.  SLP facilitated session with Min cues to utilize external aids to recall previously discussed memory compensatory strategies.  Patient required Mod multimodal cues during a structured task to demonstrate use of association as a compensatory strategy.  Son and daughter present at end of session and SLP initiated education regarding memory goals and recommendations for discharge.  Continue with current plan of care.    FIM:  Comprehension Comprehension Mode: Auditory Comprehension: 5-Follows basic conversation/direction: With extra time/assistive device Expression Expression Mode: Verbal Expression: 5-Expresses basic needs/ideas: With extra time/assistive device Social Interaction Social Interaction: 5-Interacts appropriately 90% of the time - Needs monitoring or encouragement for participation or interaction. Problem Solving Problem Solving: 5-Solves basic 90% of the time/requires cueing < 10% of the time Memory Memory: 4-Recognizes or recalls 75 - 89% of the time/requires cueing 10 - 24% of the time  Pain Pain Assessment Pain Assessment: No/denies pain  Therapy/Group: Individual Therapy  Carmelia Roller., CCC-SLP [phone]  [place] 08/08/2014, 4:41 PM

## 2014-08-09 ENCOUNTER — Inpatient Hospital Stay (HOSPITAL_COMMUNITY): Payer: Medicare Other | Admitting: Occupational Therapy

## 2014-08-09 ENCOUNTER — Inpatient Hospital Stay (HOSPITAL_COMMUNITY): Payer: Medicare Other | Admitting: Speech Pathology

## 2014-08-09 ENCOUNTER — Inpatient Hospital Stay (HOSPITAL_COMMUNITY): Payer: Medicare Other | Admitting: Physical Therapy

## 2014-08-09 LAB — GLUCOSE, CAPILLARY
GLUCOSE-CAPILLARY: 106 mg/dL — AB (ref 70–99)
GLUCOSE-CAPILLARY: 201 mg/dL — AB (ref 70–99)
GLUCOSE-CAPILLARY: 232 mg/dL — AB (ref 70–99)
GLUCOSE-CAPILLARY: 98 mg/dL (ref 70–99)
Glucose-Capillary: 164 mg/dL — ABNORMAL HIGH (ref 70–99)
Glucose-Capillary: 169 mg/dL — ABNORMAL HIGH (ref 70–99)
Glucose-Capillary: 181 mg/dL — ABNORMAL HIGH (ref 70–99)

## 2014-08-09 MED ORDER — GLUCERNA SHAKE PO LIQD
237.0000 mL | Freq: Every day | ORAL | Status: DC
  Administered 2014-08-09: 237 mL via ORAL

## 2014-08-09 NOTE — Plan of Care (Signed)
Problem: RH Memory Goal: LTG Patient demonstrate ability for day to day recall (PT) LTG: Patient will demonstrate ability for day to day recall/carryover during mobility activities with assist (PT)  Outcome: Not Met (add Reason) Pt required min cueing to carryover during functional mobility.

## 2014-08-09 NOTE — Plan of Care (Signed)
Problem: RH Simple Meal Prep Goal: LTG Patient will perform simple meal prep w/assist (OT) LTG: Patient will perform simple meal prep with assistance, with/without cues (OT).  Not a focus of treatment sessions, and pt and family report that they will be doing all the cooking.

## 2014-08-09 NOTE — Progress Notes (Signed)
Patient ID: Tammy Hughes, female   DOB: 04-25-1933, 81 y.o.   MRN: [id number]  No new issues Tolerated full liquids  Past Medical History  Diagnosis Date  . HYPERCHOLESTEROLEMIA 02/01/2008  . ANXIETY 03/08/2008  . PERIPHERAL NEUROPATHY 02/23/2007  . HYPERTENSION 03/08/2008  . GERD 02/23/2007  . DIVERTICULOSIS, COLON 03/08/2008  . OSTEOARTHRITIS 02/23/2007  . OSTEOPOROSIS 02/23/2007  . Urolithiasis   . Allergy     SEASONAL  . Cataract     REMOVED BILATERAL  . DIABETES MELLITUS, TYPE I 02/23/2007  . Depression    Patient Vitals for the past 24 hrs:  BP Temp Temp src Pulse SpO2 Weight  08/09/14 0300 (!) 151/58 mmHg 98.2 F (36.8 C) Oral 71 97 % 68.6 kg (151 lb 3.8 oz)  08/08/14 2046 (!) 118/56 mmHg - - 64 - -  08/08/14 1620 (!) 145/60 mmHg 97.6 F (36.4 C) Oral 65 99 % -  08/08/14 0905 (!) 130/56 mmHg - - 63 - -     Intake/Output Summary (Last 24 hours) at 08/09/14 0856 Last data filed at 08/09/14 0730  Gross per 24 hour  Intake   1800 ml  Output      0 ml  Net   1800 ml       Objective: Vital Signs: Blood pressure 151/58, pulse 71, temperature 98.2 F (36.8 C), temperature source Oral, resp. rate 18, height 5' (1.524 m), weight 68.6 kg (151 lb 3.8 oz), SpO2 97 %. No results found. Results for orders placed or performed during the hospital encounter of 07/28/14 (from the past 72 hour(s))  Glucose, capillary     Status: Abnormal   Collection Time: 08/06/14 12:03 PM  Result Value Ref Range   Glucose-Capillary 161 (H) 70 - 99 mg/dL   Comment 1 Notify RN   Glucose, capillary     Status: Abnormal   Collection Time: 08/06/14  4:19 PM  Result Value Ref Range   Glucose-Capillary 139 (H) 70 - 99 mg/dL   Comment 1 Notify RN   Glucose, capillary     Status: Abnormal   Collection Time: 08/06/14  8:43 PM  Result Value Ref Range   Glucose-Capillary 128 (H) 70 - 99 mg/dL   Comment 1 Notify RN   Glucose, capillary     Status: Abnormal   Collection Time: 08/07/14 12:33 AM   Result Value Ref Range   Glucose-Capillary 216 (H) 70 - 99 mg/dL   Comment 1 Notify RN   Glucose, capillary     Status: Abnormal   Collection Time: 08/07/14  4:09 AM  Result Value Ref Range   Glucose-Capillary 239 (H) 70 - 99 mg/dL   Comment 1 Notify RN   Glucose, capillary     Status: Abnormal   Collection Time: 08/07/14  8:17 AM  Result Value Ref Range   Glucose-Capillary 235 (H) 70 - 99 mg/dL  Glucose, capillary     Status: Abnormal   Collection Time: 08/07/14 12:19 PM  Result Value Ref Range   Glucose-Capillary 160 (H) 70 - 99 mg/dL  Glucose, capillary     Status: Abnormal   Collection Time: 08/07/14  4:03 PM  Result Value Ref Range   Glucose-Capillary 123 (H) 70 - 99 mg/dL  Glucose, capillary     Status: Abnormal   Collection Time: 08/07/14  8:05 PM  Result Value Ref Range   Glucose-Capillary 109 (H) 70 - 99 mg/dL   Comment 1 Notify RN   Glucose, capillary     Status:  Abnormal   Collection Time: 08/07/14 11:56 PM  Result Value Ref Range   Glucose-Capillary 199 (H) 70 - 99 mg/dL   Comment 1 Notify RN   Glucose, capillary     Status: Abnormal   Collection Time: 08/08/14  4:06 AM  Result Value Ref Range   Glucose-Capillary 255 (H) 70 - 99 mg/dL   Comment 1 Notify RN   Glucose, capillary     Status: Abnormal   Collection Time: 08/08/14  8:27 AM  Result Value Ref Range   Glucose-Capillary 196 (H) 70 - 99 mg/dL  Glucose, capillary     Status: Abnormal   Collection Time: 08/08/14 12:13 PM  Result Value Ref Range   Glucose-Capillary 229 (H) 70 - 99 mg/dL  Glucose, capillary     Status: Abnormal   Collection Time: 08/08/14  4:28 PM  Result Value Ref Range   Glucose-Capillary 144 (H) 70 - 99 mg/dL  Glucose, capillary     Status: Abnormal   Collection Time: 08/08/14  8:12 PM  Result Value Ref Range   Glucose-Capillary 219 (H) 70 - 99 mg/dL  Glucose, capillary     Status: Abnormal   Collection Time: 08/09/14 12:14 AM  Result Value Ref Range   Glucose-Capillary 201 (H)  70 - 99 mg/dL  Glucose, capillary     Status: Abnormal   Collection Time: 08/09/14  4:13 AM  Result Value Ref Range   Glucose-Capillary 164 (H) 70 - 99 mg/dL  Glucose, capillary     Status: Abnormal   Collection Time: 08/09/14  7:09 AM  Result Value Ref Range   Glucose-Capillary 232 (H) 70 - 99 mg/dL   Comment 1 Notify RN     General- alert and appropriate; no distress HEENT- negative Chest- decrease BS both bases, more prominebt on the R; bibasilar rales L>R CV- no tachy Abd-  Midline incision clean; s/p j tube site clean; soft and non tender Extr- neg   Medical Problem List and Plan: 1. Functional deficits secondary to severe debilitation related to gastric cancer and subsequent gastrectomy with jejunostomy tube 07/20/2014- plan discharge in a.m. 2. DVT Prophylaxis/Anticoagulation: SCDs. Monitor for any signs of DVT  3. Skin/Wound Care: Routine skin checks of surgical sites as well as routine turning to maintain skin  4.  Asymptomatic VT. follow-up cardiology services. Lopressor 25 mg twice a day, Lasix 20 mg daily. Cardiac rate controlled 5. Diabetes mellitus with peripheral neuropathy. Latest hemoglobin A1c 8.1. Lantus insulin 35units daily at bedtime. Check blood sugars before meals and at bedtime. Monitor closely while on tube feeds 6. Hyperlipidemia. Crestor 7. Decreased nutritional storage. Continue nutritional support via jejunostomy tube. Dietary follow-up for cycling of tube feeds. Patient is to remain nothing by mouth other than ice chips 8.  HCAP- start Zosyn and Vanco-pharmacy to dose  LOS (Days) 12 A FACE TO FACE EVALUATION WAS PERFORMED  Luetta Piazza E 08/09/2014, 8:56 AM

## 2014-08-09 NOTE — Discharge Summary (Signed)
Discharge summary job [phone]

## 2014-08-09 NOTE — Progress Notes (Addendum)
NUTRITION FOLLOW UP  INTERVENTION: At 1800 daily, continue nocturnal tube feeding of Glucerna 1.2 via J-tube at goal rate of 90 ml/hr x 13 hours (6PM-7AM) to provide 1404 kcal, 70 grams of protein, and 948 ml of free water.   Provide 100 ml of free water flushes every 6 hours (4 times daily).  Provide Glucerna Shake po once daily, each supplement provides 220 kcal and 10 grams of protein  Encourage PO intake.  NUTRITION DIAGNOSIS: Inadequate oral intake related to inability to eat as evidenced by NPO status; ongoing  Goal: Intake to meet >90% of estimated nutrition needs; met  Monitor:  TF tolerance/adequacy, weight trend, labs.  81 y.o. female  Admitting Dx: Gastric cancer  ASSESSMENT: Patient admitted on 1/6 S/P laparoscopy with proximal gastrectomy and feeding jejunostomy.  Pt has been tolerating her nocturnal tube feeding well with no other difficulties per RN. Pt has been advanced to a full liquid diet. Meal completion has been 10-25%. RD to order Glucerna Shake to aid in caloric and protein needs as po intake has been poor. Current plans for pt to be discharged tomorrow and to continue with current TF regimen. Continue encouragement of po intake.  CBG's 164-232 ml/dL.  Height: Ht Readings from Last 1 Encounters:  07/28/14 5' (1.524 m)    Weight: Wt Readings from Last 1 Encounters:  08/09/14 151 lb 3.8 oz (68.6 kg)    BMI:  30.4 - class I obesity   Re-Estimated Nutritional Needs: Kcal: 1700-2000 Protein: 75-85 gm Fluid: >/=1.5 L  Skin: abdominal incision, non-pitting RUE edema  Diet Order: Diet full liquid   Intake/Output Summary (Last 24 hours) at 08/09/14 1438 Last data filed at 08/09/14 1200  Gross per 24 hour  Intake   1800 ml  Output      0 ml  Net   1800 ml    Last BM: 1/26  Labs:   Recent Labs Lab 08/05/14 0910  NA 136  K 4.5  CL 98  CO2 28  BUN 15  CREATININE 0.98  CALCIUM 9.7  GLUCOSE 218*    CBG (last 3)   Recent Labs  08/09/14 0413 08/09/14 0709 08/09/14 1204  GLUCAP 164* 232* 181*    Scheduled Meds:  Continuous Infusions:  Past Medical History  Diagnosis Date  . HYPERCHOLESTEROLEMIA 02/01/2008  . ANXIETY 03/08/2008  . PERIPHERAL NEUROPATHY 02/23/2007  . HYPERTENSION 03/08/2008  . GERD 02/23/2007  . DIVERTICULOSIS, COLON 03/08/2008  . OSTEOARTHRITIS 02/23/2007  . OSTEOPOROSIS 02/23/2007  . Urolithiasis   . Allergy     SEASONAL  . Cataract     REMOVED BILATERAL  . DIABETES MELLITUS, TYPE I 02/23/2007  . Depression     Past Surgical History  Procedure Laterality Date  . Cholecystectomy  1995  . Esophagogastroduodenoscopy  06/08/2002  . Gastrectomy N/A 07/20/2014    Procedure: PROXIMAL GASTRECTOMY;  Surgeon: Stark Klein, MD;  Location: [facility];  Service: General;  Laterality: N/A;  . Laparoscopy N/A 07/20/2014    Procedure: LAPAROSCOPY DIAGNOSTIC;  Surgeon: Stark Klein, MD;  Location: [facility];  Service: General;  Laterality: N/A;  . Gastrojejunostomy N/A 07/20/2014    Procedure: GASTROJEJUNOSTOMY;  Surgeon: Stark Klein, MD;  Location: [facility];  Service: General;  Laterality: N/A;    Kallie Locks, MS, RD, LDN Pager # [phone] After hours/ weekend pager # [phone]

## 2014-08-09 NOTE — Progress Notes (Signed)
Physical Therapy Discharge Summary  Patient Details  Name: NOZOMI METTLER MRN: [id number] Date of Birth: 1932-12-11  Today's Date: 08/09/2014 PT Individual Time: 1300-1400 PT Individual Time Calculation (min): 60 min    Patient has met 9 of 10 long term goals due to improved activity tolerance, improved balance, improved postural control, increased strength and improved coordination.  Patient to discharge at an ambulatory level Supervision.   Patient's care partner is independent to provide the necessary physical and cognitive assistance at discharge.  Reasons goals not met: Goal addressing memory (in context of functional mobility) not met secondary to pt requiring cueing to recall safe technique with functional mobility.  Recommendation:  Patient will benefit from ongoing skilled PT services in home health setting to continue to advance safe functional mobility, address ongoing impairments in stability/independence with functional mobility and minimize fall risk.  Equipment: w/c (16"x16") and cushion; youth rolling walker  Reasons for discharge: treatment goals met and discharge from hospital  Patient/family agrees with progress made and goals achieved: Yes   Skilled Therapeutic Interventions/Progress Updates Pt received semi reclined in bed; agreeable to therapy. Session focused on assessing/addressing stability and independence with functional mobility. See discharge evaluation below for detailed findings. Completed Berg Balance Scale with score of 47/56. See below for details. Provided education on findings and functional implications. Pt verbalized understanding. Educated pt on all findings, progress, and discharge plan. Pt verbalized understanding and was in full agreement with discharge plan. Session ended in pt room, where pt was left semi reclined in bed with 3 rails up, bed alarm on, and son present.  PT Discharge Precautions/Restrictions Precautions Precautions:  Fall Precaution Comments: jejunostomy tube, gastrostomy Restrictions Weight Bearing Restrictions: No Pain Pain Assessment Pain Assessment: No/denies pain Vision/Perception    No apparent visual deficits. Cognition Overall Cognitive Status: Impaired/Different from baseline Arousal/Alertness: Awake/alert Orientation Level: Oriented X4 Attention: Selective Selective Attention: Appears intact Memory: Impaired Memory Impairment: Decreased recall of new information;Retrieval deficit Awareness: Appears intact Problem Solving: Impaired Problem Solving Impairment: Functional complex Executive Function: Self Monitoring;Self Correcting Self Monitoring: Impaired Self Monitoring Impairment: Functional complex Self Correcting: Impaired Self Correcting Impairment: Functional complex Safety/Judgment: Appears intact Sensation Sensation Light Touch: Appears Intact Proprioception: Appears Intact Coordination Gross Motor Movements are Fluid and Coordinated: Yes Fine Motor Movements are Fluid and Coordinated: Yes Finger Nose Finger Test: grossly intact, somewhat slow Heel Shin Test: Grossly intact and equal bilaterally. Motor  Motor Motor: Within Functional Limits  Mobility Bed Mobility Bed Mobility: Supine to Sit;Sit to Supine;Scooting to HOB Supine to Sit: 6: Modified independent (Device/Increase time);HOB flat Sit to Supine: 6: Modified independent (Device/Increase time);HOB flat Scooting to HOB: 6: Modified independent (Device/Increase time) Transfers Sit to Stand: From bed;6: Modified independent (Device/Increase time);From chair/3-in-1 Stand to Sit: 6: Modified independent (Device/Increase time);To bed;To chair/3-in-1 Stand Pivot Transfers: 5: Supervision Stand Pivot Transfer Details: Verbal cues for precautions/safety Locomotion  Ambulation Ambulation: Yes Ambulation/Gait Assistance: 5: Supervision Ambulation Distance (Feet): 250 Feet Assistive device: Rolling  walker;None Gait Gait: Yes Gait Pattern: Impaired Gait Pattern: Step-through pattern;Wide base of support;Decreased stride length (Wide BOS when ambulating without rolling walker) Gait velocity: Self-selected gait speed = .63 m/s High Level Ambulation High Level Ambulation: Side stepping;Backwards walking Side Stepping: supervision with and without rolling walker Backwards Walking: supervision with and without rolling walker Stairs / Additional Locomotion Stairs: Yes Stairs Assistance: 5: Supervision Stairs Assistance Details: Verbal cues for precautions/safety Number of Stairs: 13 Ramp: 5: Supervision (using rolling walker) Curb: 5: Supervision (using rolling walker)  Wheelchair Mobility Wheelchair Mobility: Yes Wheelchair Assistance: 5: Supervision Wheelchair Propulsion: Both upper extremities Wheelchair Parts Management: Supervision/cueing Distance: 150  Trunk/Postural Assessment  Cervical Assessment Cervical Assessment: Within Functional Limits Thoracic Assessment Thoracic Assessment: Within Functional Limits Lumbar Assessment Lumbar Assessment: Within Functional Limits Postural Control Postural Control: Within Functional Limits  Balance Balance Balance Assessed: Yes Standardized Balance Assessment Standardized Balance Assessment: Berg Balance Test Berg Balance Test Sit to Stand: Able to stand without using hands and stabilize independently Standing Unsupported: Able to stand safely 2 minutes Sitting with Back Unsupported but Feet Supported on Floor or Stool: Able to sit safely and securely 2 minutes Stand to Sit: Sits safely with minimal use of hands Transfers: Able to transfer with verbal cueing and /or supervision Standing Unsupported with Eyes Closed: Able to stand 10 seconds safely Standing Ubsupported with Feet Together: Able to place feet together independently and stand 1 minute safely From Standing, Reach Forward with Outstretched Arm: Can reach forward >12 cm  safely (5") From Standing Position, Pick up Object from Floor: Able to pick up shoe, needs supervision From Standing Position, Turn to Look Behind Over each Shoulder: Looks behind one side only/other side shows less weight shift (weight shift to L>R) Turn 360 Degrees: Able to turn 360 degrees safely but slowly Standing Unsupported, Alternately Place Feet on Step/Stool: Able to stand independently and safely and complete 8 steps in 20 seconds Standing Unsupported, One Foot in Front: Able to place foot tandem independently and hold 30 seconds Standing on One Leg: Able to lift leg independently and hold equal to or more than 3 seconds Total Score: 47 Dynamic Sitting Balance Dynamic Sitting - Level of Assistance: 6: Modified independent (Device/Increase time) Dynamic Standing Balance Dynamic Standing - Level of Assistance: 5: Stand by assistance Dynamic Standing - Comments: See Berg Balance Scale above. Extremity Assessment  RLE Assessment RLE Assessment: Within Functional Limits LLE Assessment LLE Assessment: Within Functional Limits  See FIM for current functional status  Hobble, Blair A 08/09/2014, 7:32 PM   

## 2014-08-09 NOTE — Progress Notes (Signed)
Speech Language Pathology Discharge Summary  Patient Details  Name: Tammy Hughes MRN: [id number] Date of Birth: 11-11-1932  Today's Date: 08/09/2014 SLP Individual Time: 1105-1205 SLP Individual Time Calculation (min): 60 min   Skilled Therapeutic Interventions:  Pt was seen for skilled ST targeting cognitive goals. Upon arrival, pt was seated upright in wheelchair, awake, alert, and agreeable to participate in ST.  SLP facilitated the session with a basic medication management task targeting recall of new information.  Pt required overall mod assist to recall function of medication when named, although she was min assist-supervision for recall of medications previously known from prior to admission.  Pt recalled general details about her medication administration with supervision question cues (i.e. All meds crushed and given via J tube except Maalox which is given as a PO liquid).  SLP reviewed and reinforced recommendation that pt have assistance for medication and financial management at discharge.  Pt in agreement.  Pt also recalled at least 1 memory compensatory strategy from previous therapy session with min assist-supervision question cues.  Pt endorsed that she feels she has returned to her cognitive baseline and she demonstrated good awareness of her current cognitive and physical limitations as well as ways to compensate for them.  No word finding difficulties noted during semi-complex conversations related to discharge planning.  Pt is on track for discharge home tomorrow.     Patient has met 2 of 3 long term goals.  Patient to discharge at overall Supervision level.  Reasons goals not met: Pt requires min assist for recall of new information    Clinical Impression/Discharge Summary:  Pt made functional gains while inpatient and is discharging having met 2 out of 3 long term goals due to improved word finding and semi-complex problem solving.  Pt currently requires supervision for  cognitive tasks due to decreased recall of new information and she reports that she has returned to her cognitive baseline.  Pt is discharging home with 24/7 supervision and assistance for medication and financial management from family.  Given that pt reports that she is at her cognitive baseline and that family is willing to provide the recommended level of assistance needed for successful discharge, no further ST needs are indicated at this time.  Pt and family education is complete.    Care Partner:  Caregiver Able to Provide Assistance: Yes  Type of Caregiver Assistance: Physical;Cognitive  Recommendation:  None     Equipment: none recommended by SLP    Reasons for discharge: Discharged from hospital   Patient/Family Agrees with Progress Made and Goals Achieved: Yes   See FIM for current functional status  Emilio Math 08/09/2014, 3:35 PM

## 2014-08-09 NOTE — Progress Notes (Signed)
Occupational Therapy Discharge Summary  Patient Details  Name: Tammy Hughes MRN: [id number] Date of Birth: April 08, 1933  Patient has met 10 of 10 long term goals due to improved activity tolerance, improved balance and ability to compensate for deficits.  Patient to discharge at overall Supervision level.  Patient's care partner is independent to provide the necessary physical and cognitive assistance at discharge.    Reasons goals not met: N/A   Recommendation:  Patient will benefit from ongoing skilled OT services in home health setting to continue to advance functional skills in the area of BADL and Reduce care partner burden.  Equipment: BSC and tub transfer bench  Reasons for discharge: treatment goals met and discharge from hospital  Patient/family agrees with progress made and goals achieved: Yes  OT Discharge Precautions/Restrictions  Precautions Precaution Comments: jejunostomy tube, gastrostomy General   Vital Signs Therapy Vitals Temp: 98.1 F (36.7 C) Temp Source: Oral Pulse Rate: 64 Resp: 18 BP: (!) 131/55 mmHg Patient Position (if appropriate): Lying Oxygen Therapy SpO2: 98 % O2 Device: Not Delivered Pain Pain Assessment Pain Assessment: No/denies pain ADL  See FIM Vision/Perception  Vision- History Baseline Vision/History: Wears glasses Wears Glasses: At all times Patient Visual Report: No change from baseline Vision- Assessment Vision Assessment?: No apparent visual deficits  Cognition Overall Cognitive Status: Impaired/Different from baseline Arousal/Alertness: Awake/alert Orientation Level: Oriented X4 Attention: Selective Selective Attention: Appears intact Memory: Impaired Memory Impairment: Decreased recall of new information;Retrieval deficit Awareness: Appears intact Problem Solving: Impaired Problem Solving Impairment: Functional complex Executive Function: Self Monitoring;Self Correcting Self Monitoring: Impaired Self Monitoring  Impairment: Functional complex Self Correcting: Impaired Self Correcting Impairment: Functional complex Safety/Judgment: Appears intact Sensation Sensation Light Touch: Appears Intact Proprioception: Appears Intact Coordination Fine Motor Movements are Fluid and Coordinated: Yes Finger Nose Finger Test: grossly intact, somewhat slow  Balance Balance Balance Assessed: Yes Standardized Balance Assessment Standardized Balance Assessment: Berg Balance Test Berg Balance Test Sit to Stand: Able to stand without using hands and stabilize independently Standing Unsupported: Able to stand safely 2 minutes Sitting with Back Unsupported but Feet Supported on Floor or Stool: Able to sit safely and securely 2 minutes Stand to Sit: Sits safely with minimal use of hands Transfers: Able to transfer with verbal cueing and /or supervision Standing Unsupported with Eyes Closed: Able to stand 10 seconds safely Standing Ubsupported with Feet Together: Able to place feet together independently and stand 1 minute safely From Standing, Reach Forward with Outstretched Arm: Can reach forward >12 cm safely (5") From Standing Position, Pick up Object from Floor: Able to pick up shoe, needs supervision From Standing Position, Turn to Look Behind Over each Shoulder: Looks behind one side only/other side shows less weight shift (weight shift to L>R) Turn 360 Degrees: Able to turn 360 degrees safely but slowly Standing Unsupported, Alternately Place Feet on Step/Stool: Able to stand independently and safely and complete 8 steps in 20 seconds Standing Unsupported, One Foot in Front: Able to place foot tandem independently and hold 30 seconds Standing on One Leg: Able to lift leg independently and hold equal to or more than 3 seconds Total Score: 47 Dynamic Sitting Balance Dynamic Sitting - Level of Assistance: 6: Modified independent (Device/Increase time) Dynamic Standing Balance Dynamic Standing - Level of  Assistance: 5: Stand by assistance Dynamic Standing - Comments: See Berg Balance Scale above. Extremity/Trunk Assessment RUE Assessment RUE Assessment:  (strength grossly 5/5) LUE Assessment LUE Assessment: Within Functional Limits (strength grossly 5/5)  See FIM for current  functional status  Mylan Lengyel, [facility] 08/09/2014, 4:13 PM

## 2014-08-09 NOTE — Discharge Summary (Signed)
NAMEBEULAH, CAPOBIANCO                ACCOUNT NO.:  [id number]  MEDICAL RECORD NO.:  [id number]  LOCATION:  4W10C                        FACILITY:  [facility]  PHYSICIAN:  Charlett Blake, M.D.DATE OF BIRTH:  [birth year]  DATE OF ADMISSION:  07/28/2014 DATE OF DISCHARGE:  08/10/2014                              DISCHARGE SUMMARY   DISCHARGE DIAGNOSES: 1. Functional deficits secondary to severe debilitation related to     gastric cancer, subsequent gastrectomy and jejunostomy tube,     July 20, 2014. 2. Sequential compression devices for DVT prophylaxis. 3. Pain management. 4. Decreased nutritional storage. 5. Asymptomatic ventricular tachycardia. 6. Diabetes mellitus, peripheral neuropathy. 7. Hyperlipidemia. 8. Healthcare-associated pneumonia, resolved.  HISTORY OF PRESENT ILLNESS:  This is a 81 year old right-handed female with history of recent diagnosis of gastric cancer followed by Gastroenterology with incidental discovery upon endoscopy to follow up for an esophageal stricture.  Biopsy is positive for adenocarcinoma. The patient was independent prior to admission, living with her husband. Admitted on July 20, 2014, underwent proximal gastrectomy and feeding jejunostomy tube per Dr. Barry Dienes.  Hospital course, pain management.  On July 24, 2014, with 18 beats of NSVT.  EKG showed normal sinus rhythm, borderline LVH.  No chest pain or shortness of breath. Cardiology Service is consulted.  Mildly elevated troponin of 0.60. Echocardiogram with ejection fraction of 60% without PFO, placed on low- dose metoprolol.  Tube feeds initiated and cycling of tube feeds with diet slowly advanced.  Physical and occupational therapy ongoing.  The patient was admitted for comprehensive rehab program.  PAST MEDICAL HISTORY:  See discharge diagnoses.  SOCIAL HISTORY:  Lives with spouse.  FUNCTIONAL HISTORY:  Prior to admission, independent.  FUNCTIONAL STATUS:  Upon admission to rehab  service was minimal assist, stand pivot transfers; moderate assist, lateral scoot transfers; min-mod assist, activities of daily living.  PHYSICAL EXAMINATION:  VITAL SIGNS:  Blood pressure 142/58, pulse 69, temperature 99, respirations 18. GENERAL:  This was an alert female.  She was able to provide her name and age, followed simple commands.  Surgical site healing nicely. LUNGS:  Clear to auscultation. CARDIAC:  Regular rate and rhythm. ABDOMEN:  Soft, nontender.  Good bowel sounds.  REHABILITATION HOSPITAL COURSE:  The patient was admitted to inpatient rehab services with therapies initiated on a 3-hour daily basis consisting of physical therapy, occupational therapy, and rehabilitation nursing.  The following issues were addressed during the patient's rehabilitation stay.  Pertaining to Mrs. Purdon' gastric cancer, subsequent gastrectomy, jejunostomy tube, she was undergoing cycling of her tube feeds.  Surgical site is healing nicely.  Diet slowly advanced. She would follow up with General Surgery.  Case also discussed with Oncology, Dr. Benay Spice, who plans outpatient 5-FU as an outpatient. Sequential compression devices for DVT prophylaxis.  Pain management with the use of Hycet and good results.  She had no chest pain or shortness of breath.  She remained on low-dose beta-blocker for asymptomatic V-tach, followed by Cardiology Services.  She continued on Crestor for hyperlipidemia.  The patient received weekly collaborative interdisciplinary team conferences to discuss estimated length of stay, family teaching, and any barriers to her discharge.  Sessions focused on hands on training  with family.  The patient explained and demonstrated safe techniques for providing supervision, appropriate cuing, ambulating short controlled household distances, rolling walker supervision, toilet transfers with supervision rolling walker.  She could increase her ambulation to 210 feet.  Strength  and endurance continued to improve greatly.  The patient was able to shower with min assist supervision, sponge bathe on a tub transfer bench.  Full family teaching was completed.  Plan was discharge to home with ongoing therapy as dictated per rehab services.  DISCHARGE MEDICATIONS:  Included; 1. Plavix 75 mg p.o. daily. 2. Lasix 20 mg p.o. daily. 3. Hycet 15 mL p.o. every 4 hours as needed, moderate pain. 4. Lantus insulin 40 units subcutaneous at bedtime. 5. Ativan 1 mg t.i.d. as needed, anxiety. 6. Lopressor 25 mg p.o. b.i.d. 7. Protonix 40 mg p.o. daily. 8. Crestor 10 mg p.o. daily.  DIET:  Glucerna 1000 mL at 90 mL an hour.  Free water 100 mL every 6 hours by tube.  Her diet was a full liquid diet.  FOLLOWUP:  The patient to follow up with Dr. Alysia Penna at the outpatient rehab service office as needed; Dr. Barry Dienes, call for appointment; Dr. [name] as directed; Dr. Renato Shin on August 17, 2014.Follow up Cardiology DR [facility] call for appointment     Lauraine Rinne, P.A.   ______________________________ Charlett Blake, M.D.    DA/MEDQ  D:  08/09/2014  T:  08/09/2014  Job:  [id number]  cc:   Ladell Pier, M.D. Stark Klein, MD Sean A. Loanne Drilling, MD

## 2014-08-09 NOTE — Progress Notes (Signed)
Occupational Therapy Session Note  Patient Details  Name: Tammy Hughes MRN: [id number] Date of Birth: 1932-11-15  Today's Date: 08/09/2014 OT Individual Time: 0211-1552 OT Individual Time Calculation (min): 60 min    Short Term Goals: Week 2:  OT Short Term Goal 1 (Week 2): STG = LTGs due to remaining LOS  Skilled Therapeutic Interventions/Progress Updates:    Completed ADL retraining at overall supervision level with focus on increased participation in self-care tasks and modifications based on activity tolerance.  Pt requested to bathe at sink this session, reporting not ready to shower yet.  Pt supervision with all bathing and dressing at sit> stand level this session.  In ADL apt pt completed walk-in shower transfer and toilet transfer recalling education on shower transfer technique from last week.  Pt reports frustration with ability to recall tasks performed during OT sessions but unable to recall some other things.  Reiterated writing things down to promote recall.  Therapy Documentation Precautions:  Precautions Precautions: Fall Precaution Comments: jejunostomy tube, gastrostomy Restrictions Weight Bearing Restrictions: No General:   Vital Signs: Therapy Vitals Pulse Rate: 61 BP: (!) 130/54 mmHg Pain:  Pt with no c/o pain  See FIM for current functional status  Therapy/Group: Individual Therapy  Simonne Come 08/09/2014, 10:54 AM

## 2014-08-09 NOTE — Plan of Care (Signed)
Problem: RH Memory Goal: LTG Patient will use memory compensatory aids to (SLP) LTG: Patient will use memory compensatory aids to recall biographical/new, daily complex information with cues (SLP)  Outcome: Progressing Pt requires min assist for recall of new information

## 2014-08-10 ENCOUNTER — Encounter: Payer: Self-pay | Admitting: Internal Medicine

## 2014-08-10 ENCOUNTER — Encounter: Payer: Self-pay | Admitting: Gastroenterology

## 2014-08-10 DIAGNOSIS — F329 Major depressive disorder, single episode, unspecified: Secondary | ICD-10-CM | POA: Diagnosis not present

## 2014-08-10 DIAGNOSIS — C169 Malignant neoplasm of stomach, unspecified: Secondary | ICD-10-CM | POA: Diagnosis not present

## 2014-08-10 DIAGNOSIS — I472 Ventricular tachycardia: Secondary | ICD-10-CM | POA: Diagnosis not present

## 2014-08-10 DIAGNOSIS — Z483 Aftercare following surgery for neoplasm: Secondary | ICD-10-CM | POA: Diagnosis not present

## 2014-08-10 DIAGNOSIS — I10 Essential (primary) hypertension: Secondary | ICD-10-CM | POA: Diagnosis not present

## 2014-08-10 DIAGNOSIS — E1143 Type 2 diabetes mellitus with diabetic autonomic (poly)neuropathy: Secondary | ICD-10-CM | POA: Diagnosis not present

## 2014-08-10 LAB — GLUCOSE, CAPILLARY
Glucose-Capillary: 199 mg/dL — ABNORMAL HIGH (ref 70–99)
Glucose-Capillary: 253 mg/dL — ABNORMAL HIGH (ref 70–99)

## 2014-08-10 MED ORDER — FUROSEMIDE 20 MG PO TABS: ORAL_TABLET | ORAL | Status: DC

## 2014-08-10 MED ORDER — ROSUVASTATIN CALCIUM 10 MG PO TABS: 10.0000 mg | ORAL_TABLET | Freq: Every day | ORAL | Status: DC

## 2014-08-10 MED ORDER — CLOPIDOGREL BISULFATE 75 MG PO TABS: 75.0000 mg | ORAL_TABLET | Freq: Every day | ORAL | Status: DC

## 2014-08-10 MED ORDER — INSULIN GLARGINE 100 UNIT/ML ~~LOC~~ SOLN: 40.0000 [IU] | Freq: Every day | SUBCUTANEOUS | Status: DC

## 2014-08-10 MED ORDER — HYDROCODONE-ACETAMINOPHEN 7.5-325 MG/15ML PO SOLN: 15.0000 mL | ORAL | Status: DC | PRN

## 2014-08-10 MED ORDER — METOPROLOL TARTRATE 25 MG PO TABS: 25.0000 mg | ORAL_TABLET | Freq: Two times a day (BID) | ORAL | Status: DC

## 2014-08-10 MED ORDER — OMEPRAZOLE 20 MG PO CPDR: 20.0000 mg | DELAYED_RELEASE_CAPSULE | Freq: Two times a day (BID) | ORAL | Status: DC

## 2014-08-10 MED ORDER — FUROSEMIDE 20 MG PO TABS: 20.0000 mg | ORAL_TABLET | Freq: Every day | ORAL | Status: DC

## 2014-08-10 MED ORDER — LORAZEPAM 1 MG PO TABS: 1.0000 mg | ORAL_TABLET | Freq: Three times a day (TID) | ORAL | Status: DC | PRN

## 2014-08-10 NOTE — Progress Notes (Signed)
Patient ID: Tammy Hughes, female   DOB: [birth year], 81 y.o.   MRN: [id number]  No new issues Tolerated full liquids  Past Medical History  Diagnosis Date  . HYPERCHOLESTEROLEMIA 02/01/2008  . ANXIETY 03/08/2008  . PERIPHERAL NEUROPATHY 02/23/2007  . HYPERTENSION 03/08/2008  . GERD 02/23/2007  . DIVERTICULOSIS, COLON 03/08/2008  . OSTEOARTHRITIS 02/23/2007  . OSTEOPOROSIS 02/23/2007  . Urolithiasis   . Allergy     SEASONAL  . Cataract     REMOVED BILATERAL  . DIABETES MELLITUS, TYPE I 02/23/2007  . Depression    Patient Vitals for the past 24 hrs:  BP Temp Temp src Pulse Resp SpO2 Weight  08/10/14 0600 - - - - - - 69 kg (152 lb 1.9 oz)  08/10/14 0442 (!) 141/62 mmHg 98.2 F (36.8 C) Oral (!) 59 18 99 % -  08/09/14 2100 (!) 142/60 mmHg - - 69 - - -  08/09/14 1430 (!) 131/55 mmHg 98.1 F (36.7 C) Oral 64 18 98 % -  08/09/14 0916 (!) 130/54 mmHg - - 61 - - -     Intake/Output Summary (Last 24 hours) at 08/10/14 0858 Last data filed at 08/10/14 0730  Gross per 24 hour  Intake    720 ml  Output      0 ml  Net    720 ml       Objective: Vital Signs: Blood pressure 141/62, pulse 59, temperature 98.2 F (36.8 C), temperature source Oral, resp. rate 18, height 5' (1.524 m), weight 69 kg (152 lb 1.9 oz), SpO2 99 %. No results found. Results for orders placed or performed during the hospital encounter of 07/28/14 (from the past 72 hour(s))  Glucose, capillary     Status: Abnormal   Collection Time: 08/07/14 12:19 PM  Result Value Ref Range   Glucose-Capillary 160 (H) 70 - 99 mg/dL  Glucose, capillary     Status: Abnormal   Collection Time: 08/07/14  4:03 PM  Result Value Ref Range   Glucose-Capillary 123 (H) 70 - 99 mg/dL  Glucose, capillary     Status: Abnormal   Collection Time: 08/07/14  8:05 PM  Result Value Ref Range   Glucose-Capillary 109 (H) 70 - 99 mg/dL   Comment 1 Notify RN   Glucose, capillary     Status: Abnormal   Collection Time: 08/07/14 11:56 PM  Result  Value Ref Range   Glucose-Capillary 199 (H) 70 - 99 mg/dL   Comment 1 Notify RN   Glucose, capillary     Status: Abnormal   Collection Time: 08/08/14  4:06 AM  Result Value Ref Range   Glucose-Capillary 255 (H) 70 - 99 mg/dL   Comment 1 Notify RN   Glucose, capillary     Status: Abnormal   Collection Time: 08/08/14  8:27 AM  Result Value Ref Range   Glucose-Capillary 196 (H) 70 - 99 mg/dL  Glucose, capillary     Status: Abnormal   Collection Time: 08/08/14 12:13 PM  Result Value Ref Range   Glucose-Capillary 229 (H) 70 - 99 mg/dL  Glucose, capillary     Status: Abnormal   Collection Time: 08/08/14  4:28 PM  Result Value Ref Range   Glucose-Capillary 144 (H) 70 - 99 mg/dL  Glucose, capillary     Status: Abnormal   Collection Time: 08/08/14  8:12 PM  Result Value Ref Range   Glucose-Capillary 219 (H) 70 - 99 mg/dL  Glucose, capillary     Status: Abnormal  Collection Time: 08/09/14 12:14 AM  Result Value Ref Range   Glucose-Capillary 201 (H) 70 - 99 mg/dL  Glucose, capillary     Status: Abnormal   Collection Time: 08/09/14  4:13 AM  Result Value Ref Range   Glucose-Capillary 164 (H) 70 - 99 mg/dL  Glucose, capillary     Status: Abnormal   Collection Time: 08/09/14  7:09 AM  Result Value Ref Range   Glucose-Capillary 232 (H) 70 - 99 mg/dL   Comment 1 Notify RN   Glucose, capillary     Status: Abnormal   Collection Time: 08/09/14 12:04 PM  Result Value Ref Range   Glucose-Capillary 181 (H) 70 - 99 mg/dL   Comment 1 Notify RN   Glucose, capillary     Status: Abnormal   Collection Time: 08/09/14  4:10 PM  Result Value Ref Range   Glucose-Capillary 106 (H) 70 - 99 mg/dL  Glucose, capillary     Status: None   Collection Time: 08/09/14  8:06 PM  Result Value Ref Range   Glucose-Capillary 98 70 - 99 mg/dL  Glucose, capillary     Status: Abnormal   Collection Time: 08/09/14 11:41 PM  Result Value Ref Range   Glucose-Capillary 169 (H) 70 - 99 mg/dL  Glucose, capillary      Status: Abnormal   Collection Time: 08/10/14  4:03 AM  Result Value Ref Range   Glucose-Capillary 199 (H) 70 - 99 mg/dL  Glucose, capillary     Status: Abnormal   Collection Time: 08/10/14  8:03 AM  Result Value Ref Range   Glucose-Capillary 253 (H) 70 - 99 mg/dL   Comment 1 Notify RN     General- alert and appropriate; no distress HEENT- negative Chest- decrease BS both bases, more prominebt on the R; bibasilar rales L>R CV- no tachy Abd-  Midline incision clean; s/p j tube site clean; soft and non tender Extr- neg   Medical Problem List and Plan: 1. Functional deficits secondary to severe debilitation related to gastric cancer and subsequent gastrectomy with jejunostomy tube 07/20/2014- plan discharge in a.m. 2. DVT Prophylaxis/Anticoagulation: SCDs. Monitor for any signs of DVT  3. Skin/Wound Care: Routine skin checks of surgical sites as well as routine turning to maintain skin  4.  Asymptomatic VT. follow-up cardiology services.Has Appt with Dr Debara Pickett   Lopressor 25 mg twice a day, Lasix 20 mg daily. Cardiac rate controlled 5. Diabetes mellitus with peripheral neuropathy. Latest hemoglobin A1c 8.1. Lantus insulin 35units daily at bedtime. Check blood sugars before meals and at bedtime. Monitor closely while on tube feeds 6. Hyperlipidemia. Crestor 7. Decreased nutritional storage. Continue nutritional support via jejunostomy tube. Dietary follow-up for cycling of tube feeds. Patient is to remain nothing by mouth other than ice chips 8.  HCAP- resolved s/p abx treatment , afeb off abx for >72hrs  LOS (Days) 13 A FACE TO FACE EVALUATION WAS PERFORMED  KIRSTEINS,ANDREW E 08/10/2014, 8:58 AM

## 2014-08-10 NOTE — Progress Notes (Signed)
Social Work Discharge Note Discharge Note  The overall goal for the admission was met for:   Discharge location: [place] 24 HR  Length of Stay: Yes-13 DAYS  Discharge activity level: Yes-SUPERVISION/MIN LEVEL  Home/community participation: Yes  Services provided included: MD, RD, PT, OT, SLP, RN, CM, Pharmacy, Neuropsych and SW  Financial Services: Medicare and Private Insurance: [place] BCBS  Follow-up services arranged: Home Health: [place] [facility]-PT,OT,RN, DME: [facility] BED, TUB SEAT, YOUTH ROLLING WALKER, BEDSIDE COMMODE, [place] 1.2 WITH FEEDING PUMP and Patient/Family has no preference for HH/DME agencies  Comments (or additional information):FAMILY [place], SOMEONE WAS ALWAYS HERE WITH HER. [facility] TO GO OUT TONIGHT TO MAKE SURE FEEL COMFORTABLE WITH CONNECTING AND DISCONNECTING TUBE FEEDS. DAUGHTER AWARE OF THIS AND FEELS IT IS NEEDED.  Patient/Family verbalized understanding of follow-up arrangements: Yes  Individual responsible for coordination of the follow-up plan: [facility] AND BEN-HUSBAND  Confirmed correct DME delivered: Elease Hashimoto 08/10/2014    Elease Hashimoto

## 2014-08-10 NOTE — Discharge Instructions (Signed)
Inpatient Rehab Discharge Instructions  Tammy Hughes Discharge date and time: No discharge date for patient encounter.   Activities/Precautions/ Functional Status: Activity: activity as tolerated Diet: Glucerna 90 mL an hour and free water 100 mL's every 6 hours Wound Care: keep wound clean and dry Functional status:  ___ No restrictions     ___ Walk up steps independently _x__ 24/7 supervision/assistance   ___ Walk up steps with assistance ___ Intermittent supervision/assistance  ___ Bathe/dress independently ___ Walk with walker     ___ Bathe/dress with assistance ___ Walk Independently    ___ Shower independently _x__ Walk with assistance    ___ Shower with assistance ___ No alcohol     ___ Return to work/school ________  Special Instructions:    COMMUNITY REFERRALS UPON DISCHARGE:    Home Health:   PT, OT, SP, RN    Agency:[facility] [place] Phone: [phone] Date of last service:08/10/2014  Medical Equipment/Items Ordered:HOSPTIAL BED, WHEELCHAIR, [place], [place], BEDSIDE COMMODE AND [place]     [phone]    My questions have been answered and I understand these instructions. I will adhere to these goals and the provided educational materials after my discharge from the hospital.  Patient/Caregiver Signature _______________________________ Date __________  Clinician Signature _______________________________________ Date __________  Please bring this form and your medication list with you to all your follow-up doctor's appointments.

## 2014-08-10 NOTE — Progress Notes (Signed)
Patient and patient's daughter given discharge instructions, and educated on administration of medication via PEG tube, and tube feeding using kangaroo pump. Patient's daughter verbalize understanding, and demonstrated understanding of education given. Tammy Hughes., PA given discharge information to patient, husband, and daughter. Patient assisted to car via wheelchair assisted by nurse tech, and belongings packed and taken by daughter. [place]

## 2014-08-11 ENCOUNTER — Telehealth: Payer: Self-pay | Admitting: *Deleted

## 2014-08-11 ENCOUNTER — Telehealth: Payer: Self-pay

## 2014-08-11 NOTE — Telephone Encounter (Signed)
Pt's daughter and [place] home health advised of note below and voiced understanding.

## 2014-08-11 NOTE — Telephone Encounter (Signed)
[place] with [facility] called about pt's sugar readings. Since getting discharged from the hospital her sugars in the morning have been 300. Pt is currently taking Lantus 40 units in the evening. Before going into the hospital blood pt was taking novolog and her sugar were under better control. Wanted to know if Novolog should be added back to current medication list. Please advise, Thanks!

## 2014-08-11 NOTE — Consult Note (Signed)
NEUROCOGNITIVE STATUS EXAMINATION - CONFIDENTIAL  [facility]   Mrs.  Donnarae Rae is a 81 year old, right-handed woman, who was seen for a brief neurocognitive status examination in the setting of gastric cancer.  According to her medical record, she was admitted on 07/20/14 and underwent proximal gastrectomy.  Recently, she was observed to have bouts of confusion and restlessness at night.  Therefore, a consult with neuropsychology was requested in order to determine the nature and extent of any emotional changes and cognitive deficits and to help determine the etiology of any cognitive changes.    Emotional Functioning:  During the clinical interview, Mrs. Lundblad described multiple dynamics between herself and her daughters and she acknowledged some worry over those dynamics.  However, she also stated that she is now feeling comfortable with her discharge plan and is feeling "excited" to get back home and get back to church.  She cited use of prayer and reading the bible to cope with her stressors.  In fact, Mrs. Gillin stated that spirituality has greatly helped her to cope with her diagnosis of cancer.  She clarified that she no longer feels as though she has cancer, stating that she has been "healed."  Still, she said that she is willing to follow any treatment recommendations that her doctors make.  Overall, she described feeling sufficiently supported by family and optimistic regarding her recovery.  Her total scores on self-report measures of mood symptoms were not suggestive of the presence of clinically significant depression or anxiety at this time.    Mental Status:  Mrs. Derden' total score on an overall measure of mental status was suggestive of marked cognitive disruption, at the level of dementia (MoCA = 14/30).  She lost most points for executive functioning difficulties and language problems, but her memory and orientation were intact.  Subjectively, she reported  noticing memory changes.    Impressions and Recommendations:  Mrs. Arata' total score on an overall measure of mental status was suggestive of marked cognitive disruption, at the level of dementia.  Most difficulties were related to executive dysfunction.  The exact etiology of her cognitive deficits is unclear, but could be related to fatigue or stress.  While we cannot definitively rule out the presence of an underlying neurological disorder, the pattern of scores is not suggestive of a neurodegenerative condition (e.g. Alzheimer's disease).  A more comprehensive neuropsychological evaluation as an outpatient is recommended to better clarify the nature and extent of cognitive disruption and to further assist with diagnostic clarification.  From an emotional standpoint, there was no evidence to suggest the presence of clinically significant depression or anxiety and therefore, no medication adjustments are recommended.  She may, however, benefit from continued psychosocial support during her stay.    DIAGNOSIS:   Gastric Cancer  Marlane Hatcher, Psy.D.  Clinical Neuropsychologist

## 2014-08-11 NOTE — Telephone Encounter (Signed)
Please change lantus to NOVOLOG MIX 70/30 FLEXPEN, 50 units qam. Please tell pt i hope she gets well soon.

## 2014-08-11 NOTE — Telephone Encounter (Signed)
Daughter called on pt's behalf, says pt was discharged yesterday under Dr. Letta Pate care. She was given a script for Lantus but not Novalog. Pt normally takes Novalog 70/30 mix, 70 units in the morning and 60 units at night. Blood Sugar = 352, daughter cannot reach Dr. Loanne Drilling and is asking our clinic to please call her today (with emphasis)

## 2014-08-11 NOTE — Telephone Encounter (Signed)
Dr Loanne Drilling called pt back and changed insulin

## 2014-08-12 ENCOUNTER — Telehealth: Payer: Self-pay | Admitting: Endocrinology

## 2014-08-12 ENCOUNTER — Other Ambulatory Visit: Payer: Self-pay

## 2014-08-12 DIAGNOSIS — I10 Essential (primary) hypertension: Secondary | ICD-10-CM | POA: Diagnosis not present

## 2014-08-12 DIAGNOSIS — Z483 Aftercare following surgery for neoplasm: Secondary | ICD-10-CM | POA: Diagnosis not present

## 2014-08-12 DIAGNOSIS — F329 Major depressive disorder, single episode, unspecified: Secondary | ICD-10-CM | POA: Diagnosis not present

## 2014-08-12 DIAGNOSIS — C169 Malignant neoplasm of stomach, unspecified: Secondary | ICD-10-CM | POA: Diagnosis not present

## 2014-08-12 DIAGNOSIS — E1143 Type 2 diabetes mellitus with diabetic autonomic (poly)neuropathy: Secondary | ICD-10-CM | POA: Diagnosis not present

## 2014-08-12 DIAGNOSIS — I472 Ventricular tachycardia: Secondary | ICD-10-CM | POA: Diagnosis not present

## 2014-08-12 MED ORDER — GLUCOSE BLOOD VI STRP: ORAL_STRIP | Status: DC

## 2014-08-12 MED ORDER — GLUCOSE BLOOD VI STRP
ORAL_STRIP | Status: DC
Start: 2014-08-12 — End: 2014-12-12

## 2014-08-12 NOTE — Telephone Encounter (Signed)
Tammy Hughes patient daughter would like to speak to concerning her mom B/S phone # [phone]

## 2014-08-12 NOTE — Telephone Encounter (Signed)
I spoke with pt's daughter. She states this morning her blood sugar was 368. After giving 50 units of Novolog 70/30 blood sugar came down to 240. Daughter states that before pt went into the hospital she was taking 70 units in the morning and 60 units in the evening. Daughter wanted to know if we could change Rx to this dosage as she was having better control with previous dosage.  Please advise,  Thanks!

## 2014-08-12 NOTE — Telephone Encounter (Signed)
Things are different now, since your hospital visit. Please increase to 60 units qam Needs f/u appt next week

## 2014-08-12 NOTE — Telephone Encounter (Signed)
Pt daughter advised of note below and voiced understanding. Rx for pt's test stirps sent per request. Pt has appointment on 08/17/2014.

## 2014-08-15 ENCOUNTER — Telehealth: Payer: Self-pay

## 2014-08-15 DIAGNOSIS — F329 Major depressive disorder, single episode, unspecified: Secondary | ICD-10-CM | POA: Diagnosis not present

## 2014-08-15 DIAGNOSIS — Z483 Aftercare following surgery for neoplasm: Secondary | ICD-10-CM | POA: Diagnosis not present

## 2014-08-15 DIAGNOSIS — I10 Essential (primary) hypertension: Secondary | ICD-10-CM | POA: Diagnosis not present

## 2014-08-15 DIAGNOSIS — C169 Malignant neoplasm of stomach, unspecified: Secondary | ICD-10-CM | POA: Diagnosis not present

## 2014-08-15 DIAGNOSIS — I472 Ventricular tachycardia: Secondary | ICD-10-CM | POA: Diagnosis not present

## 2014-08-15 DIAGNOSIS — E1143 Type 2 diabetes mellitus with diabetic autonomic (poly)neuropathy: Secondary | ICD-10-CM | POA: Diagnosis not present

## 2014-08-15 NOTE — Telephone Encounter (Signed)
Pt's daughter called to report blood sugar reading. Saturday morning after 5 cans of Glucerna reading was 480, Sunday morning after 4 cans for glucerna reading was 383, this morning after 4 cans for glucerna blood sugar was 406.  Please advise, Thanks!

## 2014-08-15 NOTE — Telephone Encounter (Signed)
Increase insulin to 80 units qam Ov this week

## 2014-08-16 NOTE — Telephone Encounter (Signed)
Pt's daughter advised of note below and voiced understanding. Pt's blood sugar this morning after 5 cans of Glucerna was 580. Daughter gave 60 units of insulin the morning she is going to administer the extra 20 units now. Pt has appointment 08/17/2014.

## 2014-08-17 ENCOUNTER — Ambulatory Visit (INDEPENDENT_AMBULATORY_CARE_PROVIDER_SITE_OTHER): Payer: Medicare Other | Admitting: Endocrinology

## 2014-08-17 ENCOUNTER — Encounter: Payer: Self-pay | Admitting: Endocrinology

## 2014-08-17 VITALS — BP 124/72 | HR 79 | Temp 97.9°F | Ht 61.0 in | Wt 147.0 lb

## 2014-08-17 DIAGNOSIS — E1140 Type 2 diabetes mellitus with diabetic neuropathy, unspecified: Secondary | ICD-10-CM | POA: Diagnosis not present

## 2014-08-17 DIAGNOSIS — E1142 Type 2 diabetes mellitus with diabetic polyneuropathy: Secondary | ICD-10-CM

## 2014-08-17 MED ORDER — INSULIN ASPART PROT & ASPART (70-30 MIX) 100 UNIT/ML ~~LOC~~ SUSP: SUBCUTANEOUS | Status: DC

## 2014-08-17 MED ORDER — ESOMEPRAZOLE MAGNESIUM 40 MG PO CPDR: 40.0000 mg | DELAYED_RELEASE_CAPSULE | Freq: Every day | ORAL | Status: DC

## 2014-08-17 MED ORDER — PROMETHAZINE HCL 12.5 MG PO TABS: 12.5000 mg | ORAL_TABLET | ORAL | Status: DC | PRN

## 2014-08-17 MED ORDER — HYDROCODONE-ACETAMINOPHEN 7.5-325 MG/15ML PO SOLN: 15.0000 mL | ORAL | Status: DC | PRN

## 2014-08-17 MED ORDER — MICONAZOLE NITRATE 2 % EX OINT: 1.0000 "application " | TOPICAL_OINTMENT | Freq: Two times a day (BID) | CUTANEOUS | Status: DC

## 2014-08-17 NOTE — Patient Instructions (Addendum)
Please change the insulin to 60 units with breakfast, and 20 units with the evening meal You can stop taking the furosemide Please change the omeprazole to nexium.  i have sent a prescription to your pharmacy Here is a refill of the pain medication.  check your blood sugar twice a day.  vary the time of day when you check, between before the 3 meals, and at bedtime.  also check if you have symptoms of your blood sugar being too high or too low.  please keep a record of the readings and bring it to your next appointment here.  You can write it on any piece of paper.  please call us sooner if your blood sugar goes below 70, or if you have a lot of readings over 200.   Please come back for a follow-up appointment in 1 month.

## 2014-08-17 NOTE — Progress Notes (Signed)
Subjective:    Patient ID: Tammy Hughes, female    DOB: [birth year], 81 y.o.   MRN: [id number]  HPI  The state of at least three ongoing medical problems is addressed today, with interval history of each noted here: Pt returns for f/u of diabetes mellitus: DM type: Insulin-requiring type 2 Dx'ed: 0350 Complications: polyneuropathy Therapy: insulin since 2005 GDM: never DKA: never Severe hypoglycemia: never Pancreatitis: never Other: she chose bid premixed insulin Interval history: She has lost weight. no cbg record, but states cbg's vary from 80-200's.  It is lowest in the afternoon Edema: is resolved Denies LOC.  GERD: persists abd pain: is improved, but persists. Past Medical History  Diagnosis Date  . HYPERCHOLESTEROLEMIA 02/01/2008  . ANXIETY 03/08/2008  . PERIPHERAL NEUROPATHY 02/23/2007  . HYPERTENSION 03/08/2008  . GERD 02/23/2007  . DIVERTICULOSIS, COLON 03/08/2008  . OSTEOARTHRITIS 02/23/2007  . OSTEOPOROSIS 02/23/2007  . Urolithiasis   . Allergy     SEASONAL  . Cataract     REMOVED BILATERAL  . DIABETES MELLITUS, TYPE I 02/23/2007  . Depression     Past Surgical History  Procedure Laterality Date  . Cholecystectomy  1995  . Esophagogastroduodenoscopy  06/08/2002  . Gastrectomy N/A 07/20/2014    Procedure: PROXIMAL GASTRECTOMY;  Surgeon: Stark Klein, MD;  Location: [facility];  Service: General;  Laterality: N/A;  . Laparoscopy N/A 07/20/2014    Procedure: LAPAROSCOPY DIAGNOSTIC;  Surgeon: Stark Klein, MD;  Location: [facility];  Service: General;  Laterality: N/A;  . Gastrojejunostomy N/A 07/20/2014    Procedure: GASTROJEJUNOSTOMY;  Surgeon: Stark Klein, MD;  Location: [facility];  Service: General;  Laterality: N/A;    History   Social History  . Marital Status: Married    Spouse Name: N/A    Number of Children: N/A  . Years of Education: N/A   Occupational History  . Retired    Social History Main Topics  . Smoking status: Never Smoker   . Smokeless tobacco: Never  Used  . Alcohol Use: No  . Drug Use: No  . Sexual Activity: Not on file   Other Topics Concern  . Not on file   Social History Narrative   Dentist          Current Outpatient Prescriptions on File Prior to Visit  Medication Sig Dispense Refill  . Calcium Carbonate-Vit D-Min (CALCIUM 600 + MINERALS) 600-200 MG-UNIT TABS Take 1 tablet by mouth daily.      . clopidogrel (PLAVIX) 75 MG tablet Take 1 tablet (75 mg total) by mouth daily. 30 tablet 1  . glucose blood (BAYER CONTOUR TEST) test strip Use to check blood sugar 2/day. Dx code E11.9 100 each 2  . Insulin Pen Needle 31G X 8 MM MISC USE AS DIRECTED WITH INSULIN TWICE DAILY Dx: 250.01 200 each prn  . metoprolol tartrate (LOPRESSOR) 25 MG tablet Take 1 tablet (25 mg total) by mouth 2 (two) times daily. 60 tablet 1  . rosuvastatin (CRESTOR) 10 MG tablet Take 1 tablet (10 mg total) by mouth daily. 90 tablet 3  . LORazepam (ATIVAN) 1 MG tablet Place 1 tablet (1 mg total) into feeding tube 3 (three) times daily as needed for anxiety. 30 tablet 0   No current facility-administered medications on file prior to visit.    Allergies  Allergen Reactions  . Pirfenidone Diarrhea and Nausea And Vomiting  . Aspirin     rash    Family History  Problem Relation Age of Onset  .  Cancer Father     Prostate Cancer  . Cancer Sister     Breast Cancer  . Diabetes Sister   . Diabetes Sister     BP 124/72 mmHg  Pulse 79  Temp(Src) 97.9 F (36.6 C) (Oral)  Ht 5\' 1"  (1.549 m)  Wt 147 lb (66.679 kg)  BMI 27.79 kg/m2  SpO2 94%    Review of Systems Nausea persists.      Objective:   Physical Exam VITAL SIGNS:  See vs page GENERAL: no distress Pulses: dorsalis pedis intact bilat.   MSK: no deformity of the feet CV: no leg edema Skin:  no ulcer on the feet.  normal color and temp on the feet. Neuro: sensation is intact to touch on the feet.        Assessment & Plan:  DM: mod exacerbation GERD: she needs  increased rx Edema: improved abd pain, improved but persistent    Patient is advised the following: Patient Instructions  Please change the insulin to 60 units with breakfast, and 20 units with the evening meal You can stop taking the furosemide Please change the omeprazole to nexium.  i have sent a prescription to your pharmacy Here is a refill of the pain medication.  check your blood sugar twice a day.  vary the time of day when you check, between before the 3 meals, and at bedtime.  also check if you have symptoms of your blood sugar being too high or too low.  please keep a record of the readings and bring it to your next appointment here.  You can write it on any piece of paper.  please call us sooner if your blood sugar goes below 70, or if you have a lot of readings over 200.   Please come back for a follow-up appointment in 1 month.

## 2014-08-18 ENCOUNTER — Encounter (HOSPITAL_COMMUNITY): Payer: Medicare Other | Admitting: Occupational Therapy

## 2014-08-18 ENCOUNTER — Ambulatory Visit (INDEPENDENT_AMBULATORY_CARE_PROVIDER_SITE_OTHER): Payer: Medicare Other | Admitting: Internal Medicine

## 2014-08-18 ENCOUNTER — Encounter: Payer: Self-pay | Admitting: Internal Medicine

## 2014-08-18 VITALS — BP 128/70 | HR 70 | Ht 60.0 in | Wt 147.5 lb

## 2014-08-18 DIAGNOSIS — I472 Ventricular tachycardia: Secondary | ICD-10-CM

## 2014-08-18 DIAGNOSIS — C169 Malignant neoplasm of stomach, unspecified: Secondary | ICD-10-CM

## 2014-08-18 DIAGNOSIS — R778 Other specified abnormalities of plasma proteins: Secondary | ICD-10-CM

## 2014-08-18 DIAGNOSIS — R7989 Other specified abnormal findings of blood chemistry: Secondary | ICD-10-CM

## 2014-08-18 DIAGNOSIS — I4729 Other ventricular tachycardia: Secondary | ICD-10-CM

## 2014-08-18 NOTE — Patient Instructions (Signed)
Your physician wants you to follow-up in: 3 months with Dr. Debara Pickett.

## 2014-08-18 NOTE — Progress Notes (Signed)
OFFICE NOTE  Chief Complaint:  Hospital follow-up  Primary Care Physician: Renato Shin, MD  HPI:  Tammy Hughes is a 81 y.o. female with a past medical history significant for DM2, dyslipidemia, and gastric cancer - no known history of CAD. She is POD #3 today and getting tube feeds, without resumption of bowel function as of yet. She was noted overnight to have an asymptomatic 18 beat run of NSVT, which self-terminated. She denies any recent chest pain or worsening shortness of breath. Her last EKG in the system was in 10/2013, which showed NSR with borderline LVH. She underwent a repeat echocardiogram in the hospital which showed a preserved EF of 55-60% with no wall motion abnormalities. I recommended starting her on Lopressor 25 mg twice daily which is taking without difficulty. She's been asymptomatic from a cardiac standpoint. She did undergo partial gastrectomy for her cancer on July 20 2014. She tolerated surgery well and did not have any preoperative stress testing. Was felt that she might benefit from stress testing as an outpatient however since she did so well for his surgery it's not clear that that is necessary. She may or may not be undergoing chemotherapy as there was evidence that the cancer has spread to lymph nodes. She is scheduled to see Dr. Benay Spice next week.  PMHx:  Past Medical History  Diagnosis Date  . HYPERCHOLESTEROLEMIA 02/01/2008  . ANXIETY 03/08/2008  . PERIPHERAL NEUROPATHY 02/23/2007  . HYPERTENSION 03/08/2008  . GERD 02/23/2007  . DIVERTICULOSIS, COLON 03/08/2008  . OSTEOARTHRITIS 02/23/2007  . OSTEOPOROSIS 02/23/2007  . Urolithiasis   . Allergy     SEASONAL  . Cataract     REMOVED BILATERAL  . DIABETES MELLITUS, TYPE I 02/23/2007  . Depression     Past Surgical History  Procedure Laterality Date  . Cholecystectomy  1995  . Esophagogastroduodenoscopy  06/08/2002  . Gastrectomy N/A 07/20/2014    Procedure: PROXIMAL GASTRECTOMY;  Surgeon: Stark Klein, MD;  Location: [place];  Service: General;  Laterality: N/A;  . Laparoscopy N/A 07/20/2014    Procedure: LAPAROSCOPY DIAGNOSTIC;  Surgeon: Stark Klein, MD;  Location: [place];  Service: General;  Laterality: N/A;  . Gastrojejunostomy N/A 07/20/2014    Procedure: GASTROJEJUNOSTOMY;  Surgeon: Stark Klein, MD;  Location[facility];  Service: General;  Laterality: N/A;    FAMHx:  Family History  Problem Relation Age of Onset  . Cancer Father     Prostate Cancer  . Cancer Sister     Breast Cancer  . Diabetes Sister   . Diabetes Sister     SOCHx:   reports that she has never smoked. She has never used smokeless tobacco. She reports that she does not drink alcohol or use illicit drugs.  ALLERGIES:  Allergies  Allergen Reactions  . Pirfenidone Diarrhea and Nausea And Vomiting  . Aspirin     rash    ROS: A comprehensive review of systems was negative except for: Gastrointestinal: positive for abdominal pain  HOME MEDS: Current Outpatient Prescriptions  Medication Sig Dispense Refill  . Calcium Carbonate-Vit D-Min (CALCIUM 600 + MINERALS) 600-200 MG-UNIT TABS Take 1 tablet by mouth daily.      . clopidogrel (PLAVIX) 75 MG tablet Take 1 tablet (75 mg total) by mouth daily. 30 tablet 1  . esomeprazole (NEXIUM) 40 MG capsule Take 1 capsule (40 mg total) by mouth daily at 12 noon. 30 capsule 11  . glucose blood (BAYER CONTOUR TEST) test strip Use to check  blood sugar 2/day. Dx code E11.9 100 each 2  . HYDROcodone-acetaminophen (HYCET) 7.5-325 mg/15 ml solution Take 15 mLs by mouth every 4 (four) hours as needed for moderate pain. 240 mL 0  . insulin aspart protamine- aspart (NOVOLOG MIX 70/30) (70-30) 100 UNIT/ML injection 60 units with breakfast, and 20 units with the evening meal 90 mL 3  . Insulin Pen Needle 31G X 8 MM MISC USE AS DIRECTED WITH INSULIN TWICE DAILY Dx: 250.01 200 each prn  . LORazepam (ATIVAN) 1 MG tablet Place 1 tablet (1 mg total) into feeding tube 3 (three) times daily  as needed for anxiety. 30 tablet 0  . metoprolol tartrate (LOPRESSOR) 25 MG tablet Take 1 tablet (25 mg total) by mouth 2 (two) times daily. 60 tablet 1  . Miconazole Nitrate 2 % OINT Apply 1 application topically 2 (two) times daily. 71 g 11  . promethazine (PHENERGAN) 12.5 MG tablet Take 1 tablet (12.5 mg total) by mouth every 4 (four) hours as needed for nausea or vomiting. 50 tablet 2  . rosuvastatin (CRESTOR) 10 MG tablet Take 1 tablet (10 mg total) by mouth daily. 90 tablet 3   No current facility-administered medications for this visit.    LABS/IMAGING: No results found for this or any previous visit (from the past 48 hour(s)). No results found.  VITALS: BP 128/70 mmHg  Pulse 70  Ht 5' (1.524 m)  Wt 147 lb 8 oz (66.906 kg)  BMI 28.81 kg/m2  EXAM: General appearance: alert and no distress Neck: no carotid bruit and no JVD Lungs: clear to auscultation bilaterally Heart: regular rate and rhythm, S1, S2 normal, no murmur, click, rub or gallop Abdomen: Large anterior midline scar-healing Extremities: extremities normal, atraumatic, no cyanosis or edema Pulses: 2+ and symmetric Skin: Skin color, texture, turgor normal. No rashes or lesions Neurologic: Grossly normal Psych: Pleasant  EKG: Deferred  ASSESSMENT: 1. NSVT postop 2. Normal LV function on echo  PLAN: 1.   Tammy Hughes had nonsustained VT on the monitor postoperatively. She was started on beta blockers and presumably has had no recurrence. She denies any angina. Her echo shows normal LV function and normal wall motion. She successfully had surgery for partial gastrectomy without evidence of heart attack although there was question of small stroke. She was placed on Plavix for this. This is good anti-anginal treatment as well. I recommend she stays on Plavix, her cholesterol medication and metoprolol. I'm not recommending further stress testing at this time unless he becomes symptomatic. I do not want to delay possible  chemotherapy if she is a candidate for that. Should it be felt that she needs further risk stratification prior to chemotherapy, I'm happy to perform a stress test.  Pixie Casino, MD, [facility] Attending Cardiologist [facility]  Tammy Hughes,Tammy Hughes 08/18/2014, 5:08 PM

## 2014-08-19 DIAGNOSIS — E1143 Type 2 diabetes mellitus with diabetic autonomic (poly)neuropathy: Secondary | ICD-10-CM | POA: Diagnosis not present

## 2014-08-19 DIAGNOSIS — Z483 Aftercare following surgery for neoplasm: Secondary | ICD-10-CM | POA: Diagnosis not present

## 2014-08-19 DIAGNOSIS — I10 Essential (primary) hypertension: Secondary | ICD-10-CM | POA: Diagnosis not present

## 2014-08-19 DIAGNOSIS — I472 Ventricular tachycardia: Secondary | ICD-10-CM | POA: Diagnosis not present

## 2014-08-19 DIAGNOSIS — C169 Malignant neoplasm of stomach, unspecified: Secondary | ICD-10-CM | POA: Diagnosis not present

## 2014-08-19 DIAGNOSIS — F329 Major depressive disorder, single episode, unspecified: Secondary | ICD-10-CM | POA: Diagnosis not present

## 2014-08-19 DIAGNOSIS — Z0279 Encounter for issue of other medical certificate: Secondary | ICD-10-CM

## 2014-08-22 DIAGNOSIS — C169 Malignant neoplasm of stomach, unspecified: Secondary | ICD-10-CM | POA: Diagnosis not present

## 2014-08-22 DIAGNOSIS — I472 Ventricular tachycardia: Secondary | ICD-10-CM | POA: Diagnosis not present

## 2014-08-22 DIAGNOSIS — E1143 Type 2 diabetes mellitus with diabetic autonomic (poly)neuropathy: Secondary | ICD-10-CM | POA: Diagnosis not present

## 2014-08-22 DIAGNOSIS — I10 Essential (primary) hypertension: Secondary | ICD-10-CM | POA: Diagnosis not present

## 2014-08-22 DIAGNOSIS — Z483 Aftercare following surgery for neoplasm: Secondary | ICD-10-CM | POA: Diagnosis not present

## 2014-08-22 DIAGNOSIS — F329 Major depressive disorder, single episode, unspecified: Secondary | ICD-10-CM | POA: Diagnosis not present

## 2014-08-23 ENCOUNTER — Telehealth: Payer: Self-pay | Admitting: Oncology

## 2014-08-23 ENCOUNTER — Ambulatory Visit (HOSPITAL_BASED_OUTPATIENT_CLINIC_OR_DEPARTMENT_OTHER): Payer: Medicare Other | Admitting: Oncology

## 2014-08-23 VITALS — BP 128/59 | HR 66 | Temp 98.5°F | Resp 18 | Ht 60.0 in | Wt 147.8 lb

## 2014-08-23 DIAGNOSIS — C165 Malignant neoplasm of lesser curvature of stomach, unspecified: Secondary | ICD-10-CM

## 2014-08-23 DIAGNOSIS — C169 Malignant neoplasm of stomach, unspecified: Secondary | ICD-10-CM

## 2014-08-23 DIAGNOSIS — I472 Ventricular tachycardia: Secondary | ICD-10-CM | POA: Diagnosis not present

## 2014-08-23 DIAGNOSIS — E1143 Type 2 diabetes mellitus with diabetic autonomic (poly)neuropathy: Secondary | ICD-10-CM | POA: Diagnosis not present

## 2014-08-23 DIAGNOSIS — Z483 Aftercare following surgery for neoplasm: Secondary | ICD-10-CM | POA: Diagnosis not present

## 2014-08-23 DIAGNOSIS — F329 Major depressive disorder, single episode, unspecified: Secondary | ICD-10-CM | POA: Diagnosis not present

## 2014-08-23 DIAGNOSIS — I10 Essential (primary) hypertension: Secondary | ICD-10-CM | POA: Diagnosis not present

## 2014-08-23 NOTE — Telephone Encounter (Signed)
Gave avs & calendar for February. °

## 2014-08-23 NOTE — Progress Notes (Signed)
  [place] OFFICE PROGRESS NOTE   Diagnosis: Gastric cancer  INTERVAL HISTORY:   She underwent a partial gastrectomy on 07/20/2014. She had  postoperative ventricular tachycardia and a NSTEMI. She developed hospital acquired pneumonia. Tammy Hughes completed a stay on the rehabilitation service and was discharged to home 08/09/2014. She continues nighttime tube feedings. Limited oral intake and activity level. She is able to ambulate in the home with assistance. She is completing a home physical therapy. Tammy Hughes reports intermittent nausea, and occasional cough, and she had dyspnea yesterday.  The pathology from the proximal gastrectomy ([id number]) revealed an invasive adenocarcinoma with signet ring cell features, poorly differentiated, measuring 3.2 cm with tumor extending into the muscular propria. A separate polyp had similar appearing intramucosal poorly differential carcinoma. Perineural invasion was identified. Metastatic carcinoma was identified in 1 of 5 lymph nodes. The resection margins are negative. Multiple other hyperplastic gastric polyps. The tumor was located at the greater curvature.   Objective:  Vital signs in last 24 hours:  Blood pressure 128/59, pulse 66, temperature 98.5 F (36.9 C), temperature source Oral, resp. rate 18, height 5' (1.524 m), weight 147 lb 12.8 oz (67.042 kg), SpO2 98 %.    HEENT: Neck without mass Resp: Inspiratory rales at the lower posterior chest bilaterally, no respiratory distress Cardio: Regular rate and rhythm GI: No hepatomegaly, left upper quadrant feeding tube site without evidence of infection, healed midline incision with Steri-Strips in place Vascular: Trace edema at the right lower leg and ankle   Medications: I have reviewed the patient's current medications.  Assessment/Plan: 1. Gastric cancer, status post an endoscopic biopsy of a lesser curvature mass on 05/31/2014 confirming adenocarcinoma  Staging CTs of  the chest and abdomen on 06/08/2014 revealed no evidence of metastatic disease  Proximal gastrectomy and feeding jejunostomy 07/20/2014, pT2,pN1  2. Pulmonary fibrosis  3. Esophageal stricture, status post a dilatation procedure 05/31/2014  4. Diabetes  5.   Postoperative ventricle tachycardia, NSTEMI January 2016  6.   Hospital-acquired pneumonia January 2016    Disposition:  Tammy Hughes is recovering from the partial gastrectomy procedure. I reviewed the details of the pathology report with Tammy Hughes and her family. She has a significant chance of developing recurrent gastric cancer over the next few years. I discussed data confirming a benefit with adjuvant 5-fluorouracil chemotherapy and radiation in this setting. I recommend 5-fluorouracil/leucovorin to be followed by concurrent capecitabine/radiation when she has recovered from surgery.  She will return for a chemotherapy teaching class and further discussion 09/06/2014.  I encouraged her to return to usual activities as tolerated. Her family plans to work with her on days she does not have physical therapy and get her out of the house as tolerated.  She is scheduled to see Dr. Barry Dienes next week.  [name], MD  08/23/2014  5:22 PM

## 2014-08-24 DIAGNOSIS — C169 Malignant neoplasm of stomach, unspecified: Secondary | ICD-10-CM | POA: Diagnosis not present

## 2014-08-24 DIAGNOSIS — Z483 Aftercare following surgery for neoplasm: Secondary | ICD-10-CM | POA: Diagnosis not present

## 2014-08-24 DIAGNOSIS — E1143 Type 2 diabetes mellitus with diabetic autonomic (poly)neuropathy: Secondary | ICD-10-CM | POA: Diagnosis not present

## 2014-08-24 DIAGNOSIS — F329 Major depressive disorder, single episode, unspecified: Secondary | ICD-10-CM

## 2014-08-24 DIAGNOSIS — I472 Ventricular tachycardia: Secondary | ICD-10-CM | POA: Diagnosis not present

## 2014-08-24 DIAGNOSIS — I10 Essential (primary) hypertension: Secondary | ICD-10-CM

## 2014-08-26 DIAGNOSIS — I10 Essential (primary) hypertension: Secondary | ICD-10-CM | POA: Diagnosis not present

## 2014-08-26 DIAGNOSIS — Z483 Aftercare following surgery for neoplasm: Secondary | ICD-10-CM | POA: Diagnosis not present

## 2014-08-26 DIAGNOSIS — F329 Major depressive disorder, single episode, unspecified: Secondary | ICD-10-CM | POA: Diagnosis not present

## 2014-08-26 DIAGNOSIS — C169 Malignant neoplasm of stomach, unspecified: Secondary | ICD-10-CM | POA: Diagnosis not present

## 2014-08-26 DIAGNOSIS — E1143 Type 2 diabetes mellitus with diabetic autonomic (poly)neuropathy: Secondary | ICD-10-CM | POA: Diagnosis not present

## 2014-08-26 DIAGNOSIS — I472 Ventricular tachycardia: Secondary | ICD-10-CM | POA: Diagnosis not present

## 2014-08-30 ENCOUNTER — Other Ambulatory Visit (INDEPENDENT_AMBULATORY_CARE_PROVIDER_SITE_OTHER): Payer: Self-pay | Admitting: General Surgery

## 2014-08-30 ENCOUNTER — Encounter: Payer: Self-pay | Admitting: Endocrinology

## 2014-08-30 ENCOUNTER — Other Ambulatory Visit (INDEPENDENT_AMBULATORY_CARE_PROVIDER_SITE_OTHER): Payer: Medicare Other

## 2014-08-30 ENCOUNTER — Other Ambulatory Visit (INDEPENDENT_AMBULATORY_CARE_PROVIDER_SITE_OTHER): Payer: Self-pay | Admitting: *Deleted

## 2014-08-30 ENCOUNTER — Ambulatory Visit (INDEPENDENT_AMBULATORY_CARE_PROVIDER_SITE_OTHER): Payer: Medicare Other | Admitting: Endocrinology

## 2014-08-30 VITALS — BP 132/84 | HR 61 | Temp 98.8°F | Ht 60.0 in

## 2014-08-30 DIAGNOSIS — R109 Unspecified abdominal pain: Secondary | ICD-10-CM

## 2014-08-30 DIAGNOSIS — C169 Malignant neoplasm of stomach, unspecified: Secondary | ICD-10-CM

## 2014-08-30 MED ORDER — INSULIN ASPART PROT & ASPART (70-30 MIX) 100 UNIT/ML ~~LOC~~ SUSP: SUBCUTANEOUS | Status: DC

## 2014-08-30 MED ORDER — LACTULOSE 10 GM/15ML PO SOLN: 30.0000 g | Freq: Two times a day (BID) | ORAL | Status: DC

## 2014-08-30 NOTE — Progress Notes (Signed)
Subjective:    Patient ID: Tammy Hughes, female    DOB: 1932/12/13, 81 y.o.   MRN: [id number]  HPI The state of at least three ongoing medical problems is addressed today, with interval history of each noted here: Pt returns for f/u of diabetes mellitus: DM type: Insulin-requiring type 2 Dx'ed: 1027 Complications: polyneuropathy  Therapy: insulin since 2005 GDM: never DKA: never Severe hypoglycemia: never Pancreatitis: never Other: she chose bid premixed insulin Interval history: She has lost weight. no cbg record, but states cbg's vary from 60-250.  It is lowest in the afternoon.  She takes tube-feeding overnight.  she eats very little during the day.   Constipation persists.   GERD: is less now.   She has few days of moderate pain at the left flank, but no assoc hematuria Past Medical History  Diagnosis Date  . HYPERCHOLESTEROLEMIA 02/01/2008  . ANXIETY 03/08/2008  . PERIPHERAL NEUROPATHY 02/23/2007  . HYPERTENSION 03/08/2008  . GERD 02/23/2007  . DIVERTICULOSIS, COLON 03/08/2008  . OSTEOARTHRITIS 02/23/2007  . OSTEOPOROSIS 02/23/2007  . Urolithiasis   . Allergy     SEASONAL  . Cataract     REMOVED BILATERAL  . DIABETES MELLITUS, TYPE I 02/23/2007  . Depression     Past Surgical History  Procedure Laterality Date  . Cholecystectomy  1995  . Esophagogastroduodenoscopy  06/08/2002  . Gastrectomy N/A 07/20/2014    Procedure: PROXIMAL GASTRECTOMY;  Surgeon: Stark Klein, MD;  Location: [place];  Service: General;  Laterality: N/A;  . Laparoscopy N/A 07/20/2014    Procedure: LAPAROSCOPY DIAGNOSTIC;  Surgeon: Stark Klein, MD;  Location: [place];  Service: General;  Laterality: N/A;  . Gastrojejunostomy N/A 07/20/2014    Procedure: GASTROJEJUNOSTOMY;  Surgeon: Stark Klein, MD;  Location: [facility];  Service: General;  Laterality: N/A;    History   Social History  . Marital Status: Married    Spouse Name: N/A  . Number of Children: N/A  . Years of Education: N/A   Occupational  History  . Retired    Social History Main Topics  . Smoking status: Never Smoker   . Smokeless tobacco: Never Used  . Alcohol Use: No  . Drug Use: No  . Sexual Activity: Not on file   Other Topics Concern  . Not on file   Social History Narrative   Dentist          Current Outpatient Prescriptions on File Prior to Visit  Medication Sig Dispense Refill  . Calcium Carbonate-Vit D-Min (CALCIUM 600 + MINERALS) 600-200 MG-UNIT TABS Take 1 tablet by mouth daily.      . clopidogrel (PLAVIX) 75 MG tablet Take 1 tablet (75 mg total) by mouth daily. 30 tablet 1  . esomeprazole (NEXIUM) 40 MG capsule Take 1 capsule (40 mg total) by mouth daily at 12 noon. 30 capsule 11  . glucose blood (BAYER CONTOUR TEST) test strip Use to check blood sugar 2/day. Dx code E11.9 100 each 2  . HYDROcodone-acetaminophen (HYCET) 7.5-325 mg/15 ml solution Take 15 mLs by mouth every 4 (four) hours as needed for moderate pain. 240 mL 0  . Insulin Pen Needle 31G X 8 MM MISC USE AS DIRECTED WITH INSULIN TWICE DAILY Dx: 250.01 200 each prn  . LORazepam (ATIVAN) 1 MG tablet Place 1 tablet (1 mg total) into feeding tube 3 (three) times daily as needed for anxiety. 30 tablet 0  . metoprolol tartrate (LOPRESSOR) 25 MG tablet Take 1 tablet (25 mg total) by  mouth 2 (two) times daily. 60 tablet 1  . Miconazole Nitrate 2 % OINT Apply 1 application topically 2 (two) times daily. 71 g 11  . promethazine (PHENERGAN) 12.5 MG tablet Take 1 tablet (12.5 mg total) by mouth every 4 (four) hours as needed for nausea or vomiting. 50 tablet 2  . rosuvastatin (CRESTOR) 10 MG tablet Take 1 tablet (10 mg total) by mouth daily. 90 tablet 3   No current facility-administered medications on file prior to visit.    Allergies  Allergen Reactions  . Pirfenidone Diarrhea and Nausea And Vomiting  . Aspirin     rash    Family History  Problem Relation Age of Onset  . Cancer Father     Prostate Cancer  . Cancer Sister      Breast Cancer  . Diabetes Sister   . Diabetes Sister     BP 132/84 mmHg  Pulse 61  Temp(Src) 98.8 F (37.1 C) (Oral)  Ht 5' (1.524 m)  SpO2 98%  Review of Systems She has regained a few lbs.  She has constipation. Denies fever and dysuria.    Objective:   Physical Exam VITAL SIGNS:  See vs page GENERAL: no distress.  In wheelchair.   Left flank: slightly tender Abd: PEG in place.  Healing midline surgical scar. nontender.  no hepatosplenomegaly.   not distended.  no hernia.    UA: mildly abnormal    Assessment & Plan:  DM: moderate exacerbation.  Based on the pattern of her cbg's (presumed due to nocturnal tube-feeding), she needs some adjustment in her therapy Abd pain, worse, uncertain etiology Anemia.  She should try to resume the fe tabs. Constipation: this could cause or contribute to abd pain GERD: well-controlled.  Please continue the same medication for this.    Patient is advised the following: Patient Instructions  Please change the insulin to 50 units with breakfast, and 30 units with the evening meal Please continue the same pain medication.  A urine test is requested for you today.  We'll let you know about the results. check your blood sugar twice a day.  vary the time of day when you check, between before the 3 meals, and at bedtime.  also check if you have symptoms of your blood sugar being too high or too low.  please keep a record of the readings and bring it to your next appointment here.  You can write it on any piece of paper.  please call us sooner if your blood sugar goes below 70, or if you have a lot of readings over 200.   Please come back for a follow-up appointment in 1 month.  Please take lactulose twice a day, via the tube.  i have sent a prescription to your pharmacy.   I hope you feel better soon.  If you don't feel better by next week, please call back.  Please call sooner if you get worse.  In either case, we'll check an ultrasound.     Please resume the iron pills, one pill, once per day.

## 2014-08-30 NOTE — Patient Instructions (Addendum)
Please change the insulin to 50 units with breakfast, and 30 units with the evening meal Please continue the same pain medication.  A urine test is requested for you today.  We'll let you know about the results. check your blood sugar twice a day.  vary the time of day when you check, between before the 3 meals, and at bedtime.  also check if you have symptoms of your blood sugar being too high or too low.  please keep a record of the readings and bring it to your next appointment here.  You can write it on any piece of paper.  please call us sooner if your blood sugar goes below 70, or if you have a lot of readings over 200.   Please come back for a follow-up appointment in 1 month.  Please take lactulose twice a day, via the tube.  i have sent a prescription to your pharmacy.   I hope you feel better soon.  If you don't feel better by next week, please call back.  Please call sooner if you get worse.  In either case, we'll check an ultrasound.   Please resume the iron pills, one pill, once per day.

## 2014-08-31 DIAGNOSIS — I10 Essential (primary) hypertension: Secondary | ICD-10-CM | POA: Diagnosis not present

## 2014-08-31 DIAGNOSIS — Z483 Aftercare following surgery for neoplasm: Secondary | ICD-10-CM | POA: Diagnosis not present

## 2014-08-31 DIAGNOSIS — C169 Malignant neoplasm of stomach, unspecified: Secondary | ICD-10-CM | POA: Diagnosis not present

## 2014-08-31 DIAGNOSIS — I472 Ventricular tachycardia: Secondary | ICD-10-CM | POA: Diagnosis not present

## 2014-08-31 DIAGNOSIS — F329 Major depressive disorder, single episode, unspecified: Secondary | ICD-10-CM | POA: Diagnosis not present

## 2014-08-31 DIAGNOSIS — E1143 Type 2 diabetes mellitus with diabetic autonomic (poly)neuropathy: Secondary | ICD-10-CM | POA: Diagnosis not present

## 2014-08-31 LAB — URINALYSIS, ROUTINE W REFLEX MICROSCOPIC
BILIRUBIN URINE: NEGATIVE
Hgb urine dipstick: NEGATIVE
Ketones, ur: NEGATIVE
LEUKOCYTES UA: NEGATIVE
NITRITE: NEGATIVE
PH: 7.5 (ref 5.0–8.0)
RBC / HPF: NONE SEEN (ref 0–?)
Specific Gravity, Urine: 1.01 (ref 1.000–1.030)
Total Protein, Urine: NEGATIVE
UROBILINOGEN UA: 0.2 (ref 0.0–1.0)
Urine Glucose: NEGATIVE

## 2014-09-01 DIAGNOSIS — I472 Ventricular tachycardia: Secondary | ICD-10-CM | POA: Diagnosis not present

## 2014-09-01 DIAGNOSIS — I10 Essential (primary) hypertension: Secondary | ICD-10-CM | POA: Diagnosis not present

## 2014-09-01 DIAGNOSIS — C169 Malignant neoplasm of stomach, unspecified: Secondary | ICD-10-CM | POA: Diagnosis not present

## 2014-09-01 DIAGNOSIS — Z483 Aftercare following surgery for neoplasm: Secondary | ICD-10-CM | POA: Diagnosis not present

## 2014-09-01 DIAGNOSIS — F329 Major depressive disorder, single episode, unspecified: Secondary | ICD-10-CM | POA: Diagnosis not present

## 2014-09-01 DIAGNOSIS — E1143 Type 2 diabetes mellitus with diabetic autonomic (poly)neuropathy: Secondary | ICD-10-CM | POA: Diagnosis not present

## 2014-09-02 ENCOUNTER — Ambulatory Visit
Admission: RE | Admit: 2014-09-02 | Discharge: 2014-09-02 | Disposition: A | Discharge status: Home / Self Care | Payer: Medicare Other | Source: Ambulatory Visit | Attending: General Surgery | Admitting: General Surgery

## 2014-09-02 DIAGNOSIS — C169 Malignant neoplasm of stomach, unspecified: Secondary | ICD-10-CM

## 2014-09-02 DIAGNOSIS — K5900 Constipation, unspecified: Secondary | ICD-10-CM | POA: Diagnosis not present

## 2014-09-02 MED ORDER — IOHEXOL 300 MG/ML  SOLN
100.0000 mL | Freq: Once | INTRAMUSCULAR | Status: AC | PRN
  Administered 2014-09-02: 100 mL via INTRAVENOUS

## 2014-09-05 DIAGNOSIS — I472 Ventricular tachycardia: Secondary | ICD-10-CM | POA: Diagnosis not present

## 2014-09-05 DIAGNOSIS — I10 Essential (primary) hypertension: Secondary | ICD-10-CM | POA: Diagnosis not present

## 2014-09-05 DIAGNOSIS — Z483 Aftercare following surgery for neoplasm: Secondary | ICD-10-CM | POA: Diagnosis not present

## 2014-09-05 DIAGNOSIS — E1143 Type 2 diabetes mellitus with diabetic autonomic (poly)neuropathy: Secondary | ICD-10-CM | POA: Diagnosis not present

## 2014-09-05 DIAGNOSIS — C169 Malignant neoplasm of stomach, unspecified: Secondary | ICD-10-CM | POA: Diagnosis not present

## 2014-09-05 DIAGNOSIS — F329 Major depressive disorder, single episode, unspecified: Secondary | ICD-10-CM | POA: Diagnosis not present

## 2014-09-06 ENCOUNTER — Telehealth: Payer: Self-pay | Admitting: Nurse Practitioner

## 2014-09-06 ENCOUNTER — Other Ambulatory Visit: Payer: Medicare Other

## 2014-09-06 ENCOUNTER — Telehealth: Payer: Self-pay | Admitting: *Deleted

## 2014-09-06 ENCOUNTER — Other Ambulatory Visit: Payer: Self-pay

## 2014-09-06 ENCOUNTER — Telehealth: Payer: Self-pay | Admitting: Endocrinology

## 2014-09-06 ENCOUNTER — Ambulatory Visit (HOSPITAL_BASED_OUTPATIENT_CLINIC_OR_DEPARTMENT_OTHER): Payer: Medicare Other | Admitting: Nurse Practitioner

## 2014-09-06 VITALS — BP 137/58 | HR 59 | Temp 97.3°F | Resp 18 | Ht 60.0 in | Wt 149.7 lb

## 2014-09-06 DIAGNOSIS — F329 Major depressive disorder, single episode, unspecified: Secondary | ICD-10-CM | POA: Diagnosis not present

## 2014-09-06 DIAGNOSIS — E119 Type 2 diabetes mellitus without complications: Secondary | ICD-10-CM | POA: Diagnosis not present

## 2014-09-06 DIAGNOSIS — C169 Malignant neoplasm of stomach, unspecified: Secondary | ICD-10-CM

## 2014-09-06 DIAGNOSIS — C165 Malignant neoplasm of lesser curvature of stomach, unspecified: Secondary | ICD-10-CM | POA: Diagnosis not present

## 2014-09-06 DIAGNOSIS — Z483 Aftercare following surgery for neoplasm: Secondary | ICD-10-CM | POA: Diagnosis not present

## 2014-09-06 DIAGNOSIS — E1143 Type 2 diabetes mellitus with diabetic autonomic (poly)neuropathy: Secondary | ICD-10-CM | POA: Diagnosis not present

## 2014-09-06 DIAGNOSIS — I472 Ventricular tachycardia: Secondary | ICD-10-CM | POA: Diagnosis not present

## 2014-09-06 DIAGNOSIS — I10 Essential (primary) hypertension: Secondary | ICD-10-CM | POA: Diagnosis not present

## 2014-09-06 MED ORDER — CLOPIDOGREL BISULFATE 75 MG PO TABS: 75.0000 mg | ORAL_TABLET | Freq: Every day | ORAL | Status: DC

## 2014-09-06 MED ORDER — METOPROLOL TARTRATE 25 MG PO TABS: 25.0000 mg | ORAL_TABLET | Freq: Two times a day (BID) | ORAL | Status: DC

## 2014-09-06 MED ORDER — ESOMEPRAZOLE MAGNESIUM 40 MG PO CPDR: 40.0000 mg | DELAYED_RELEASE_CAPSULE | Freq: Every day | ORAL | Status: DC

## 2014-09-06 NOTE — Telephone Encounter (Signed)
Per staff message and POF I have scheduled appts. Advised scheduler of appts. JMW  

## 2014-09-06 NOTE — Progress Notes (Addendum)
  [place] OFFICE PROGRESS NOTE   Diagnosis:  Gastric cancer  INTERVAL HISTORY:   Tammy Hughes returns as scheduled. She is accompanied by her daughter and husband. Her daughter reports that overall she has been doing well since her last visit until last night when she developed nausea/vomiting. She took Phenergan which has made her somewhat sleepy today. Prior to last night appetite and energy were better. She is working physical therapy. She spends the majority of the day out of bed. She denies diarrhea. Bowels moving. She continues tube feedings. She is tolerating small frequent meals. She denies fever.  Objective:  Vital signs in last 24 hours:  Blood pressure 137/58, pulse 59, temperature 97.3 F (36.3 C), temperature source Oral, resp. rate 18, height 5' (1.524 m), weight 149 lb 11.2 oz (67.903 kg), SpO2 99 %.    HEENT: No thrush or ulcers. Resp: Lungs clear bilaterally. Cardio: Regular rate and rhythm. GI: Abdomen soft and nontender. No hepatomegaly. Healed midline incision. Feeding tube left upper abdomen. Vascular: No leg edema.  Lab Results:  Lab Results  Component Value Date   WBC 12.5* 08/01/2014   HGB 10.4* 08/01/2014   HCT 31.3* 08/01/2014   MCV 86.0 08/01/2014   PLT 425* 08/01/2014   NEUTROABS 12.6* 07/29/2014    Imaging:  No results found.  Medications: I have reviewed the patient's current medications.  Assessment/Plan: 1. Gastric cancer, status post an endoscopic biopsy of a lesser curvature mass on 05/31/2014 confirming adenocarcinoma  Staging CTs of the chest and abdomen on 06/08/2014 revealed no evidence of metastatic disease  Proximal gastrectomy and feeding jejunostomy 07/20/2014, pT2,pN1  2. Pulmonary fibrosis  3. Esophageal stricture, status post a dilatation procedure 05/31/2014  4. Diabetes  5. Postoperative ventricle tachycardia, NSTEMI January 2016  6. Hospital-acquired pneumonia January 2016  7.   CT  abdomen/pelvis 09/02/2014 with postsurgical changes. Focal fluid collection in the midline abutting the distal greater curvature of the stomach measuring 2.2 x 2.9 x 3.4 cm. Mild stranding in the adjacent fat.    Disposition: Overall Tammy Hughes's performance status appears improved. Dr. Benay Spice recommends beginning adjuvant 5-fluorouracil/leucovorin. We reviewed potential toxicities associated with 5-fluorouracil including nausea, mouth sores, diarrhea, rash, skin hyperpigmentation. The  5-fluorouracil/leucovorin will be given weekly 4 beginning 09/14/2014. We will see her in follow-up prior to proceeding with treatment on 09/14/2014.  We recommended she decrease the Phenergan dose from 12.5 mg to 6.25 mg due to excessive sedation.  Patient seen with Dr. Benay Spice. 25 minutes were spent face-to-face at today's visit with the majority of that time involved in counseling/coordination of care.    Tammy Hughes, Tammy Hughes ANP/GNP-BC   09/06/2014  11:01 AM  This was a shared visit with Ned Card. We discussed the risk/benefits of 5-fu/leucovorin with Tammy Hughes and her family.  She agrees to proceed with adjuvant therapy.  Julieanne Manson, MD

## 2014-09-06 NOTE — Telephone Encounter (Signed)
Rx sent to pharmacy   

## 2014-09-06 NOTE — Telephone Encounter (Signed)
Gave avs & calendar for March. Sent message to schedule treatment. Sent messge to verify appt with MD.

## 2014-09-06 NOTE — Telephone Encounter (Signed)
Please refill both prn

## 2014-09-06 NOTE — Telephone Encounter (Signed)
Please call in to her plavix and the metotoporol 90 day calling into mail order pharmacy [facility]

## 2014-09-06 NOTE — Telephone Encounter (Signed)
Please advise if ok to refill Plavix and Metoprolol. Medications are listed under a historical provider.  Thanks!

## 2014-09-06 NOTE — Telephone Encounter (Signed)
Please also send in nexium

## 2014-09-07 DIAGNOSIS — Z483 Aftercare following surgery for neoplasm: Secondary | ICD-10-CM | POA: Diagnosis not present

## 2014-09-07 DIAGNOSIS — E1143 Type 2 diabetes mellitus with diabetic autonomic (poly)neuropathy: Secondary | ICD-10-CM | POA: Diagnosis not present

## 2014-09-07 DIAGNOSIS — F329 Major depressive disorder, single episode, unspecified: Secondary | ICD-10-CM | POA: Diagnosis not present

## 2014-09-07 DIAGNOSIS — I10 Essential (primary) hypertension: Secondary | ICD-10-CM | POA: Diagnosis not present

## 2014-09-07 DIAGNOSIS — C169 Malignant neoplasm of stomach, unspecified: Secondary | ICD-10-CM | POA: Diagnosis not present

## 2014-09-07 DIAGNOSIS — I472 Ventricular tachycardia: Secondary | ICD-10-CM | POA: Diagnosis not present

## 2014-09-08 DIAGNOSIS — Z483 Aftercare following surgery for neoplasm: Secondary | ICD-10-CM | POA: Diagnosis not present

## 2014-09-08 DIAGNOSIS — I10 Essential (primary) hypertension: Secondary | ICD-10-CM | POA: Diagnosis not present

## 2014-09-08 DIAGNOSIS — E1143 Type 2 diabetes mellitus with diabetic autonomic (poly)neuropathy: Secondary | ICD-10-CM | POA: Diagnosis not present

## 2014-09-08 DIAGNOSIS — C169 Malignant neoplasm of stomach, unspecified: Secondary | ICD-10-CM | POA: Diagnosis not present

## 2014-09-08 DIAGNOSIS — F329 Major depressive disorder, single episode, unspecified: Secondary | ICD-10-CM | POA: Diagnosis not present

## 2014-09-08 DIAGNOSIS — I472 Ventricular tachycardia: Secondary | ICD-10-CM | POA: Diagnosis not present

## 2014-09-09 DIAGNOSIS — E1143 Type 2 diabetes mellitus with diabetic autonomic (poly)neuropathy: Secondary | ICD-10-CM | POA: Diagnosis not present

## 2014-09-09 DIAGNOSIS — I10 Essential (primary) hypertension: Secondary | ICD-10-CM | POA: Diagnosis not present

## 2014-09-09 DIAGNOSIS — F329 Major depressive disorder, single episode, unspecified: Secondary | ICD-10-CM | POA: Diagnosis not present

## 2014-09-09 DIAGNOSIS — Z483 Aftercare following surgery for neoplasm: Secondary | ICD-10-CM | POA: Diagnosis not present

## 2014-09-09 DIAGNOSIS — C169 Malignant neoplasm of stomach, unspecified: Secondary | ICD-10-CM | POA: Diagnosis not present

## 2014-09-09 DIAGNOSIS — I472 Ventricular tachycardia: Secondary | ICD-10-CM | POA: Diagnosis not present

## 2014-09-11 ENCOUNTER — Other Ambulatory Visit: Payer: Self-pay | Admitting: Oncology

## 2014-09-12 DIAGNOSIS — Z483 Aftercare following surgery for neoplasm: Secondary | ICD-10-CM | POA: Diagnosis not present

## 2014-09-12 DIAGNOSIS — C169 Malignant neoplasm of stomach, unspecified: Secondary | ICD-10-CM | POA: Diagnosis not present

## 2014-09-12 DIAGNOSIS — I10 Essential (primary) hypertension: Secondary | ICD-10-CM | POA: Diagnosis not present

## 2014-09-12 DIAGNOSIS — I472 Ventricular tachycardia: Secondary | ICD-10-CM | POA: Diagnosis not present

## 2014-09-12 DIAGNOSIS — F329 Major depressive disorder, single episode, unspecified: Secondary | ICD-10-CM | POA: Diagnosis not present

## 2014-09-12 DIAGNOSIS — E1143 Type 2 diabetes mellitus with diabetic autonomic (poly)neuropathy: Secondary | ICD-10-CM | POA: Diagnosis not present

## 2014-09-13 DIAGNOSIS — I10 Essential (primary) hypertension: Secondary | ICD-10-CM | POA: Diagnosis not present

## 2014-09-13 DIAGNOSIS — E1143 Type 2 diabetes mellitus with diabetic autonomic (poly)neuropathy: Secondary | ICD-10-CM | POA: Diagnosis not present

## 2014-09-13 DIAGNOSIS — I472 Ventricular tachycardia: Secondary | ICD-10-CM | POA: Diagnosis not present

## 2014-09-13 DIAGNOSIS — C169 Malignant neoplasm of stomach, unspecified: Secondary | ICD-10-CM | POA: Diagnosis not present

## 2014-09-13 DIAGNOSIS — Z483 Aftercare following surgery for neoplasm: Secondary | ICD-10-CM | POA: Diagnosis not present

## 2014-09-13 DIAGNOSIS — F329 Major depressive disorder, single episode, unspecified: Secondary | ICD-10-CM | POA: Diagnosis not present

## 2014-09-14 ENCOUNTER — Ambulatory Visit (HOSPITAL_BASED_OUTPATIENT_CLINIC_OR_DEPARTMENT_OTHER): Payer: Medicare Other

## 2014-09-14 ENCOUNTER — Other Ambulatory Visit: Payer: Medicare Other

## 2014-09-14 ENCOUNTER — Telehealth: Payer: Self-pay | Admitting: Oncology

## 2014-09-14 ENCOUNTER — Ambulatory Visit: Payer: Medicare Other | Admitting: Nurse Practitioner

## 2014-09-14 ENCOUNTER — Ambulatory Visit (HOSPITAL_BASED_OUTPATIENT_CLINIC_OR_DEPARTMENT_OTHER): Payer: Medicare Other | Admitting: Oncology

## 2014-09-14 ENCOUNTER — Other Ambulatory Visit (HOSPITAL_BASED_OUTPATIENT_CLINIC_OR_DEPARTMENT_OTHER): Payer: Medicare Other

## 2014-09-14 ENCOUNTER — Telehealth: Payer: Self-pay | Admitting: *Deleted

## 2014-09-14 VITALS — BP 147/61 | HR 60 | Temp 97.2°F | Resp 18 | Ht 60.0 in | Wt 150.8 lb

## 2014-09-14 DIAGNOSIS — C169 Malignant neoplasm of stomach, unspecified: Secondary | ICD-10-CM

## 2014-09-14 DIAGNOSIS — C165 Malignant neoplasm of lesser curvature of stomach, unspecified: Secondary | ICD-10-CM

## 2014-09-14 DIAGNOSIS — J8410 Pulmonary fibrosis, unspecified: Secondary | ICD-10-CM

## 2014-09-14 DIAGNOSIS — Z5111 Encounter for antineoplastic chemotherapy: Secondary | ICD-10-CM

## 2014-09-14 DIAGNOSIS — E119 Type 2 diabetes mellitus without complications: Secondary | ICD-10-CM | POA: Diagnosis not present

## 2014-09-14 LAB — COMPREHENSIVE METABOLIC PANEL (CC13)
ALT: 20 U/L (ref 0–55)
ANION GAP: 13 meq/L — AB (ref 3–11)
AST: 35 U/L — ABNORMAL HIGH (ref 5–34)
Albumin: 3.2 g/dL — ABNORMAL LOW (ref 3.5–5.0)
Alkaline Phosphatase: 122 U/L (ref 40–150)
BUN: 19.4 mg/dL (ref 7.0–26.0)
CHLORIDE: 104 meq/L (ref 98–109)
CO2: 22 meq/L (ref 22–29)
CREATININE: 0.8 mg/dL (ref 0.6–1.1)
Calcium: 10.5 mg/dL — ABNORMAL HIGH (ref 8.4–10.4)
EGFR: 78 mL/min/{1.73_m2} — ABNORMAL LOW (ref >90)
Glucose: 172 mg/dl — ABNORMAL HIGH (ref 70–140)
Potassium: 4.4 mEq/L (ref 3.5–5.1)
Sodium: 139 mEq/L (ref 136–145)
Total Bilirubin: 0.27 mg/dL (ref 0.20–1.20)
Total Protein: 8.2 g/dL (ref 6.4–8.3)

## 2014-09-14 LAB — CBC WITH DIFFERENTIAL/PLATELET
BASO%: 1.3 % (ref 0.0–2.0)
Basophils Absolute: 0.1 10*3/uL (ref 0.0–0.1)
EOS ABS: 0.2 10*3/uL (ref 0.0–0.5)
EOS%: 2.9 % (ref 0.0–7.0)
HEMATOCRIT: 37.9 % (ref 34.8–46.6)
HEMOGLOBIN: 11.9 g/dL (ref 11.6–15.9)
LYMPH%: 38.3 % (ref 14.0–49.7)
MCH: 26.7 pg (ref 25.1–34.0)
MCHC: 31.3 g/dL — ABNORMAL LOW (ref 31.5–36.0)
MCV: 85.5 fL (ref 79.5–101.0)
MONO#: 0.6 10*3/uL (ref 0.1–0.9)
MONO%: 7.8 % (ref 0.0–14.0)
NEUT%: 49.7 % (ref 38.4–76.8)
NEUTROS ABS: 3.8 10*3/uL (ref 1.5–6.5)
Platelets: 406 10*3/uL — ABNORMAL HIGH (ref 145–400)
RBC: 4.43 10*6/uL (ref 3.70–5.45)
RDW: 16.9 % — AB (ref 11.2–14.5)
WBC: 7.6 10*3/uL (ref 3.9–10.3)
lymph#: 2.9 10*3/uL (ref 0.9–3.3)

## 2014-09-14 MED ORDER — DEXTROSE 5 % IV SOLN
400.0000 mg/m2 | Freq: Once | INTRAVENOUS | Status: AC
  Administered 2014-09-14: 680 mg via INTRAVENOUS
  Filled 2014-09-14: qty 34

## 2014-09-14 MED ORDER — SODIUM CHLORIDE 0.9 % IV SOLN
Freq: Once | INTRAVENOUS | Status: AC
  Administered 2014-09-14: 14:00:00 via INTRAVENOUS

## 2014-09-14 MED ORDER — PROCHLORPERAZINE MALEATE 10 MG PO TABS
5.0000 mg | ORAL_TABLET | Freq: Once | ORAL | Status: AC
  Administered 2014-09-14: 5 mg via ORAL

## 2014-09-14 MED ORDER — FLUOROURACIL CHEMO INJECTION 2.5 GM/50ML
400.0000 mg/m2 | Freq: Once | INTRAVENOUS | Status: AC
  Administered 2014-09-14: 700 mg via INTRAVENOUS
  Filled 2014-09-14: qty 14

## 2014-09-14 MED ORDER — PROCHLORPERAZINE MALEATE 10 MG PO TABS
ORAL_TABLET | ORAL | Status: AC
  Filled 2014-09-14: qty 1

## 2014-09-14 NOTE — Progress Notes (Signed)
  [place] OFFICE PROGRESS NOTE   Diagnosis: Gastric cancer  INTERVAL HISTORY:   Ms. Whitebread returns as scheduled. She is eating and ambulating in the home. She continues nighttime tube feedings. No new complaint.  Objective:  Vital signs in last 24 hours:  Blood pressure 147/61, pulse 60, temperature 97.2 F (36.2 C), temperature source Oral, resp. rate 18, height 5' (1.524 m), weight 150 lb 12.8 oz (68.402 kg).   Resp: Inspiratory rales at the lower posterior chest bilaterally, no respiratory distress Cardio: Regular rate and rhythm GI: No hepatosplenomegaly, healed midline incision, left upper quadrant feeding tube site without evidence of infection Vascular: Trace edema at the right ankle   Lab Results:  Lab Results  Component Value Date   WBC 7.6 09/14/2014   HGB 11.9 09/14/2014   HCT 37.9 09/14/2014   MCV 85.5 09/14/2014   PLT 406* 09/14/2014   NEUTROABS 3.8 09/14/2014    Lab Results  Component Value Date   NA 139 09/14/2014    No results found for: CEA  Imaging:  No results found.  Medications: I have reviewed the patient's current medications.  Assessment/Plan: 1. Gastric cancer, status post an endoscopic biopsy of a lesser curvature mass on 05/31/2014 confirming adenocarcinoma  Staging CTs of the chest and abdomen on 06/08/2014 revealed no evidence of metastatic disease  Proximal gastrectomy and feeding jejunostomy 07/20/2014, pT2,pN1  2. Pulmonary fibrosis  3. Esophageal stricture, status post a dilatation procedure 05/31/2014  4. Diabetes  5. Postoperative ventricle tachycardia, NSTEMI January 2016  6. Hospital-acquired pneumonia January 2016  7. CT abdomen/pelvis 09/02/2014 with postsurgical changes. Focal fluid collection in the midline abutting the distal greater curvature of the stomach measuring 2.2 x 2.9 x 3.4 cm. Mild stranding in the adjacent fat.  Disposition:  Her performance status appears improved.  The plan is to begin adjuvant 5-FU/leucovorin chemotherapy today. She will return for an office visit in 2 weeks.  I will refer her to Dr. Lisbeth Renshaw to consider adjuvant radiation to begin after the completion of 4 weeks of 5-FU/leucovorin.  [name], MD  09/14/2014  12:55 PM

## 2014-09-14 NOTE — Patient Instructions (Signed)
[facility] Discharge Instructions for Patients Receiving Chemotherapy  Today you received the following chemotherapy agents: Leucovorin, Adrucil (5FU push)  To help prevent nausea and vomiting after your treatment, we encourage you to take your nausea medication: Phenergan 12.5 mg every 12 hrs as needed for nausea.   If you develop nausea and vomiting that is not controlled by your nausea medication, call the clinic.   BELOW ARE SYMPTOMS THAT SHOULD BE REPORTED IMMEDIATELY:  *FEVER GREATER THAN 100.5 F  *CHILLS WITH OR WITHOUT FEVER  NAUSEA AND VOMITING THAT IS NOT CONTROLLED WITH YOUR NAUSEA MEDICATION  *UNUSUAL SHORTNESS OF BREATH  *UNUSUAL BRUISING OR BLEEDING  TENDERNESS IN MOUTH AND THROAT WITH OR WITHOUT PRESENCE OF ULCERS  *URINARY PROBLEMS  *BOWEL PROBLEMS  UNUSUAL RASH Items with * indicate a potential emergency and should be followed up as soon as possible.  Feel free to call the clinic you have any questions or concerns. The clinic phone number is [phone].  Leucovorin injection What is this medicine? LEUCOVORIN (loo koe VOR in) is used to prevent or treat the harmful effects of some medicines. This medicine is used to treat anemia caused by a low amount of folic acid in the body. It is also used with 5-fluorouracil (5-FU) to treat colon cancer. This medicine may be used for other purposes; ask your health care provider or pharmacist if you have questions. What should I tell my health care provider before I take this medicine? They need to know if you have any of these conditions: -anemia from low levels of vitamin B-12 in the blood -an unusual or allergic reaction to leucovorin, folic acid, other medicines, foods, dyes, or preservatives -pregnant or trying to get pregnant -breast-feeding How should I use this medicine? This medicine is for injection into a muscle or into a vein. It is given by a health care professional in a hospital or  clinic setting. Talk to your pediatrician regarding the use of this medicine in children. Special care may be needed. Overdosage: If you think you have taken too much of this medicine contact a poison control center or emergency room at once. NOTE: This medicine is only for you. Do not share this medicine with others. What if I miss a dose? This does not apply. What may interact with this medicine? -capecitabine -fluorouracil -phenobarbital -phenytoin -primidone -trimethoprim-sulfamethoxazole This list may not describe all possible interactions. Give your health care provider a list of all the medicines, herbs, non-prescription drugs, or dietary supplements you use. Also tell them if you smoke, drink alcohol, or use illegal drugs. Some items may interact with your medicine. What should I watch for while using this medicine? Your condition will be monitored carefully while you are receiving this medicine. This medicine may increase the side effects of 5-fluorouracil, 5-FU. Tell your doctor or health care professional if you have diarrhea or mouth sores that do not get better or that get worse. What side effects may I notice from receiving this medicine? Side effects that you should report to your doctor or health care professional as soon as possible: -allergic reactions like skin rash, itching or hives, swelling of the face, lips, or tongue -breathing problems -fever, infection -mouth sores -unusual bleeding or bruising -unusually weak or tired Side effects that usually do not require medical attention (report to your doctor or health care professional if they continue or are bothersome): -constipation or diarrhea -loss of appetite -nausea, vomiting This list may not describe all possible  side effects. Call your doctor for medical advice about side effects. You may report side effects to FDA at 1-800-FDA-1088. Where should I keep my medicine? This drug is given in a hospital or clinic  and will not be stored at home. NOTE: This sheet is a summary. It may not cover all possible information. If you have questions about this medicine, talk to your doctor, pharmacist, or health care provider.  2015, Elsevier/Gold Standard. (2008-01-05 16:50:29)  Fluorouracil, 5-FU injection/push What is this medicine? FLUOROURACIL, 5-FU (flure oh YOOR a sil) is a chemotherapy drug. It slows the growth of cancer cells. This medicine is used to treat many types of cancer like breast cancer, colon or rectal cancer, pancreatic cancer, and stomach cancer. This medicine may be used for other purposes; ask your health care provider or pharmacist if you have questions. COMMON BRAND NAME(S): Adrucil What should I tell my health care provider before I take this medicine? They need to know if you have any of these conditions: -blood disorders -dihydropyrimidine dehydrogenase (DPD) deficiency -infection (especially a virus infection such as chickenpox, cold sores, or herpes) -kidney disease -liver disease -malnourished, poor nutrition -recent or ongoing radiation therapy -an unusual or allergic reaction to fluorouracil, other chemotherapy, other medicines, foods, dyes, or preservatives -pregnant or trying to get pregnant -breast-feeding How should I use this medicine? This drug is given as an infusion or injection into a vein. It is administered in a hospital or clinic by a specially trained health care professional. Talk to your pediatrician regarding the use of this medicine in children. Special care may be needed. Overdosage: If you think you have taken too much of this medicine contact a poison control center or emergency room at once. NOTE: This medicine is only for you. Do not share this medicine with others. What if I miss a dose? It is important not to miss your dose. Call your doctor or health care professional if you are unable to keep an appointment. What may interact with this  medicine? -allopurinol -cimetidine -dapsone -digoxin -hydroxyurea -leucovorin -levamisole -medicines for seizures like ethotoin, fosphenytoin, phenytoin -medicines to increase blood counts like filgrastim, pegfilgrastim, sargramostim -medicines that treat or prevent blood clots like warfarin, enoxaparin, and dalteparin -methotrexate -metronidazole -pyrimethamine -some other chemotherapy drugs like busulfan, cisplatin, estramustine, vinblastine -trimethoprim -trimetrexate -vaccines Talk to your doctor or health care professional before taking any of these medicines: -acetaminophen -aspirin -ibuprofen -ketoprofen -naproxen This list may not describe all possible interactions. Give your health care provider a list of all the medicines, herbs, non-prescription drugs, or dietary supplements you use. Also tell them if you smoke, drink alcohol, or use illegal drugs. Some items may interact with your medicine. What should I watch for while using this medicine? Visit your doctor for checks on your progress. This drug may make you feel generally unwell. This is not uncommon, as chemotherapy can affect healthy cells as well as cancer cells. Report any side effects. Continue your course of treatment even though you feel ill unless your doctor tells you to stop. In some cases, you may be given additional medicines to help with side effects. Follow all directions for their use. Call your doctor or health care professional for advice if you get a fever, chills or sore throat, or other symptoms of a cold or flu. Do not treat yourself. This drug decreases your body's ability to fight infections. Try to avoid being around people who are sick. This medicine may increase your risk to bruise or  bleed. Call your doctor or health care professional if you notice any unusual bleeding. Be careful brushing and flossing your teeth or using a toothpick because you may get an infection or bleed more easily. If you  have any dental work done, tell your dentist you are receiving this medicine. Avoid taking products that contain aspirin, acetaminophen, ibuprofen, naproxen, or ketoprofen unless instructed by your doctor. These medicines may hide a fever. Do not become pregnant while taking this medicine. Women should inform their doctor if they wish to become pregnant or think they might be pregnant. There is a potential for serious side effects to an unborn child. Talk to your health care professional or pharmacist for more information. Do not breast-feed an infant while taking this medicine. Men should inform their doctor if they wish to father a child. This medicine may lower sperm counts. Do not treat diarrhea with over the counter products. Contact your doctor if you have diarrhea that lasts more than 2 days or if it is severe and watery. This medicine can make you more sensitive to the sun. Keep out of the sun. If you cannot avoid being in the sun, wear protective clothing and use sunscreen. Do not use sun lamps or tanning beds/booths. What side effects may I notice from receiving this medicine? Side effects that you should report to your doctor or health care professional as soon as possible: -allergic reactions like skin rash, itching or hives, swelling of the face, lips, or tongue -low blood counts - this medicine may decrease the number of white blood cells, red blood cells and platelets. You may be at increased risk for infections and bleeding. -signs of infection - fever or chills, cough, sore throat, pain or difficulty passing urine -signs of decreased platelets or bleeding - bruising, pinpoint red spots on the skin, black, tarry stools, blood in the urine -signs of decreased red blood cells - unusually weak or tired, fainting spells, lightheadedness -breathing problems -changes in vision -chest pain -mouth sores -nausea and vomiting -pain, swelling, redness at site where injected -pain, tingling,  numbness in the hands or feet -redness, swelling, or sores on hands or feet -stomach pain -unusual bleeding Side effects that usually do not require medical attention (report to your doctor or health care professional if they continue or are bothersome): -changes in finger or toe nails -diarrhea -dry or itchy skin -hair loss -headache -loss of appetite -sensitivity of eyes to the light -stomach upset -unusually teary eyes This list may not describe all possible side effects. Call your doctor for medical advice about side effects. You may report side effects to FDA at 1-800-FDA-1088. Where should I keep my medicine? This drug is given in a hospital or clinic and will not be stored at home. NOTE: This sheet is a summary. It may not cover all possible information. If you have questions about this medicine, talk to your doctor, pharmacist, or health care provider.  2015, [employer]vier/Gold Standard. (2007-11-04 13:53:16)

## 2014-09-14 NOTE — Telephone Encounter (Signed)
I have adjusted appts 

## 2014-09-14 NOTE — Telephone Encounter (Signed)
Gave avs & calendar for March. Sent message to adjust treatment. °

## 2014-09-15 ENCOUNTER — Telehealth: Payer: Self-pay | Admitting: *Deleted

## 2014-09-15 DIAGNOSIS — Z483 Aftercare following surgery for neoplasm: Secondary | ICD-10-CM | POA: Diagnosis not present

## 2014-09-15 DIAGNOSIS — E1143 Type 2 diabetes mellitus with diabetic autonomic (poly)neuropathy: Secondary | ICD-10-CM | POA: Diagnosis not present

## 2014-09-15 DIAGNOSIS — I10 Essential (primary) hypertension: Secondary | ICD-10-CM | POA: Diagnosis not present

## 2014-09-15 DIAGNOSIS — F329 Major depressive disorder, single episode, unspecified: Secondary | ICD-10-CM | POA: Diagnosis not present

## 2014-09-15 DIAGNOSIS — I472 Ventricular tachycardia: Secondary | ICD-10-CM | POA: Diagnosis not present

## 2014-09-15 DIAGNOSIS — C169 Malignant neoplasm of stomach, unspecified: Secondary | ICD-10-CM | POA: Diagnosis not present

## 2014-09-15 NOTE — Telephone Encounter (Signed)
-----   Message from [employer]. Quentin Cornwall, RN sent at 09/14/2014  2:47 PM EST ----- Regarding: chemo f/u call 1st time leucovorin/16fu

## 2014-09-15 NOTE — Telephone Encounter (Signed)
Chemotherapy follow up: Patient has no question/concerns. Instructed patient to call the Cancer center if they have any issues. Patient verbalized understanding.

## 2014-09-18 ENCOUNTER — Other Ambulatory Visit: Payer: Self-pay | Admitting: Oncology

## 2014-09-19 DIAGNOSIS — F329 Major depressive disorder, single episode, unspecified: Secondary | ICD-10-CM | POA: Diagnosis not present

## 2014-09-19 DIAGNOSIS — I10 Essential (primary) hypertension: Secondary | ICD-10-CM | POA: Diagnosis not present

## 2014-09-19 DIAGNOSIS — I472 Ventricular tachycardia: Secondary | ICD-10-CM | POA: Diagnosis not present

## 2014-09-19 DIAGNOSIS — C169 Malignant neoplasm of stomach, unspecified: Secondary | ICD-10-CM | POA: Diagnosis not present

## 2014-09-19 DIAGNOSIS — Z483 Aftercare following surgery for neoplasm: Secondary | ICD-10-CM | POA: Diagnosis not present

## 2014-09-19 DIAGNOSIS — E1143 Type 2 diabetes mellitus with diabetic autonomic (poly)neuropathy: Secondary | ICD-10-CM | POA: Diagnosis not present

## 2014-09-19 NOTE — Progress Notes (Signed)
GI Location of Tumor / Histology: Gastric Cancer  Tammy Hughes presented  months ago with symptoms of: choking / strangling episodes after eating liquids and solids,   Biopsies of (if applicable) revealed: Diagnosis 05/31/14: Stomach, biopsy, mass- ADENOCARCINOMA. Microscopic Comment- There is adenocarcinoma with focal signet ring features.  Past/Anticipated interventions by surgeon, if PYY:[id number] 07/20/14: Stomach, resection for tumor, proximal gastrectomy and omentum- INVASIVE ADENOCARCINOMA WITH SIGNET RING CELL FEATURES, POORLY DIFFERENTIATED, SPANNING 3.2 CM. - ADENOCARCINOMA EXTENDS INTO THE MUSCULARIS PROPRIA.  SEPARATE POLYP HARBORING SIMILAR APPEARING INTRAMUCOSAL POORLY DIFFERENTIATED CARCINOMA WITHSIGNET RING CELL FEATURES.- PERINEURAL INVASION IS IDENTIFIED.- METASTATIC CARCINOMA IN 1 OF 1 LYMPH NODE (1/1).- THE SURGICAL RESECTION MARGINS ARE NEGATIVE FOR ADENOCARCINOMA.- MULTIPLE OTHER HYPERPLASTIC-TYPE GASTRIC POLYPS. - SEE ONCOLOGY TABLE BELOW. Dr. Ralene Muskrat Microscopic CommentSTOMACH: Specimen: Stomach.Procedure: Proximal partial gastrectomy.Tumor Site: Greater curvature.Tumor Size: 3.2 cm (gross measurement). Histologic Type: Adenocarcinoma with signet ring cell features.Histologic Grade: G3: Poorly differentiated.Microscopic Extent of Tumor: Adenocarcinoma extends into the muscularis propria. Margins (select all that apply): Negative for adenocarcinoma.Proximal Margin: 0.4 cm.   Past/Anticipated interventions by medical oncology, if any:Dr. Benay Spice, 09/14/14, , Chemo class 09/06/14, 5 -Fu/leucovorin 1st 09/15/14  Weight changes, if any: after surgery 17 lbs, 07/20/14,   Bowel/Bladder complaints, if any: nausea, no bladder or bowel problems,   Nausea / Vomiting, if any: , Jejunostomy   tube feedings nighttime glucerna 1.2 3 cans   Pain issues, if any:  no  SAFETY ISSUES:yes, weak fatigued,   Prior radiation? No  Pacemaker/ICD?No  Possible current pregnancy?  NO  Is the patient on methotrexate? NO  Current Complaints / other details:  Married, G5,  Pneumonia hospital acquired 07/2014,  Post op V-Tach and a NSTEMI, Pulmonary fibrosis, esophageal stricture/dialatation  2003 & 05/31/14, Diabetic Father prostate cancer, sister breast cancer,in her 75's, no cigarette smoking,smokeless tobacco or alcohol, no illicit drugs,   Allergies: ASA=rash,Pirfenidone, =N,V,D

## 2014-09-20 ENCOUNTER — Encounter: Payer: Self-pay | Admitting: Radiation Oncology

## 2014-09-20 DIAGNOSIS — C169 Malignant neoplasm of stomach, unspecified: Secondary | ICD-10-CM | POA: Diagnosis not present

## 2014-09-20 DIAGNOSIS — I10 Essential (primary) hypertension: Secondary | ICD-10-CM | POA: Diagnosis not present

## 2014-09-20 DIAGNOSIS — Z483 Aftercare following surgery for neoplasm: Secondary | ICD-10-CM | POA: Diagnosis not present

## 2014-09-20 DIAGNOSIS — E1143 Type 2 diabetes mellitus with diabetic autonomic (poly)neuropathy: Secondary | ICD-10-CM | POA: Diagnosis not present

## 2014-09-20 DIAGNOSIS — I472 Ventricular tachycardia: Secondary | ICD-10-CM | POA: Diagnosis not present

## 2014-09-20 DIAGNOSIS — F329 Major depressive disorder, single episode, unspecified: Secondary | ICD-10-CM | POA: Diagnosis not present

## 2014-09-21 ENCOUNTER — Encounter: Payer: Self-pay | Admitting: Radiation Oncology

## 2014-09-21 ENCOUNTER — Ambulatory Visit
Admission: RE | Admit: 2014-09-21 | Discharge: 2014-09-21 | Disposition: A | Discharge status: Home / Self Care | Payer: Medicare Other | Source: Ambulatory Visit | Attending: Radiation Oncology | Admitting: Radiation Oncology

## 2014-09-21 ENCOUNTER — Other Ambulatory Visit (HOSPITAL_BASED_OUTPATIENT_CLINIC_OR_DEPARTMENT_OTHER): Payer: Medicare Other

## 2014-09-21 ENCOUNTER — Ambulatory Visit (HOSPITAL_BASED_OUTPATIENT_CLINIC_OR_DEPARTMENT_OTHER): Payer: Medicare Other

## 2014-09-21 VITALS — BP 163/76 | HR 65 | Temp 97.6°F | Resp 20 | Ht 60.0 in | Wt 152.8 lb

## 2014-09-21 DIAGNOSIS — C169 Malignant neoplasm of stomach, unspecified: Secondary | ICD-10-CM

## 2014-09-21 DIAGNOSIS — Z9049 Acquired absence of other specified parts of digestive tract: Secondary | ICD-10-CM | POA: Insufficient documentation

## 2014-09-21 DIAGNOSIS — C165 Malignant neoplasm of lesser curvature of stomach, unspecified: Secondary | ICD-10-CM

## 2014-09-21 DIAGNOSIS — E780 Pure hypercholesterolemia: Secondary | ICD-10-CM | POA: Diagnosis not present

## 2014-09-21 DIAGNOSIS — K219 Gastro-esophageal reflux disease without esophagitis: Secondary | ICD-10-CM | POA: Insufficient documentation

## 2014-09-21 DIAGNOSIS — Z934 Other artificial openings of gastrointestinal tract status: Secondary | ICD-10-CM | POA: Diagnosis not present

## 2014-09-21 DIAGNOSIS — F419 Anxiety disorder, unspecified: Secondary | ICD-10-CM | POA: Diagnosis not present

## 2014-09-21 DIAGNOSIS — Z794 Long term (current) use of insulin: Secondary | ICD-10-CM | POA: Insufficient documentation

## 2014-09-21 DIAGNOSIS — Z7902 Long term (current) use of antithrombotics/antiplatelets: Secondary | ICD-10-CM | POA: Insufficient documentation

## 2014-09-21 DIAGNOSIS — Z79899 Other long term (current) drug therapy: Secondary | ICD-10-CM | POA: Insufficient documentation

## 2014-09-21 DIAGNOSIS — I10 Essential (primary) hypertension: Secondary | ICD-10-CM | POA: Diagnosis not present

## 2014-09-21 DIAGNOSIS — Z5111 Encounter for antineoplastic chemotherapy: Secondary | ICD-10-CM | POA: Diagnosis not present

## 2014-09-21 DIAGNOSIS — E109 Type 1 diabetes mellitus without complications: Secondary | ICD-10-CM | POA: Diagnosis not present

## 2014-09-21 DIAGNOSIS — Z510 Encounter for antineoplastic radiation therapy: Secondary | ICD-10-CM | POA: Insufficient documentation

## 2014-09-21 HISTORY — DX: Malignant neoplasm of stomach, unspecified: C16.9

## 2014-09-21 LAB — BASIC METABOLIC PANEL (CC13)
Anion Gap: 11 mEq/L (ref 3–11)
BUN: 17.8 mg/dL (ref 7.0–26.0)
CHLORIDE: 102 meq/L (ref 98–109)
CO2: 23 mEq/L (ref 22–29)
Calcium: 10.6 mg/dL — ABNORMAL HIGH (ref 8.4–10.4)
Creatinine: 0.8 mg/dL (ref 0.6–1.1)
EGFR: 77 mL/min/{1.73_m2} — ABNORMAL LOW (ref >90)
GLUCOSE: 172 mg/dL — AB (ref 70–140)
Potassium: 4.5 mEq/L (ref 3.5–5.1)
Sodium: 136 mEq/L (ref 136–145)

## 2014-09-21 LAB — CBC WITH DIFFERENTIAL/PLATELET
BASO%: 0.8 % (ref 0.0–2.0)
Basophils Absolute: 0 10*3/uL (ref 0.0–0.1)
EOS%: 3.1 % (ref 0.0–7.0)
Eosinophils Absolute: 0.2 10*3/uL (ref 0.0–0.5)
HEMATOCRIT: 38.9 % (ref 34.8–46.6)
HGB: 12.2 g/dL (ref 11.6–15.9)
LYMPH%: 39.1 % (ref 14.0–49.7)
MCH: 27 pg (ref 25.1–34.0)
MCHC: 31.5 g/dL (ref 31.5–36.0)
MCV: 85.8 fL (ref 79.5–101.0)
MONO#: 0.6 10*3/uL (ref 0.1–0.9)
MONO%: 8.8 % (ref 0.0–14.0)
NEUT#: 3.1 10*3/uL (ref 1.5–6.5)
NEUT%: 48.2 % (ref 38.4–76.8)
Platelets: 362 10*3/uL (ref 145–400)
RBC: 4.53 10*6/uL (ref 3.70–5.45)
RDW: 16.8 % — AB (ref 11.2–14.5)
WBC: 6.4 10*3/uL (ref 3.9–10.3)
lymph#: 2.5 10*3/uL (ref 0.9–3.3)

## 2014-09-21 MED ORDER — FLUOROURACIL CHEMO INJECTION 2.5 GM/50ML
400.0000 mg/m2 | Freq: Once | INTRAVENOUS | Status: AC
  Administered 2014-09-21: 700 mg via INTRAVENOUS
  Filled 2014-09-21: qty 14

## 2014-09-21 MED ORDER — LEUCOVORIN CALCIUM INJECTION 350 MG
400.0000 mg/m2 | Freq: Once | INTRAVENOUS | Status: AC
  Administered 2014-09-21: 680 mg via INTRAVENOUS
  Filled 2014-09-21: qty 34

## 2014-09-21 MED ORDER — PROCHLORPERAZINE MALEATE 10 MG PO TABS
5.0000 mg | ORAL_TABLET | Freq: Once | ORAL | Status: AC
  Administered 2014-09-21: 5 mg via ORAL

## 2014-09-21 MED ORDER — PROCHLORPERAZINE MALEATE 10 MG PO TABS
ORAL_TABLET | ORAL | Status: AC
  Filled 2014-09-21: qty 1

## 2014-09-21 MED ORDER — SODIUM CHLORIDE 0.9 % IV SOLN
Freq: Once | INTRAVENOUS | Status: AC
  Administered 2014-09-21: 14:00:00 via INTRAVENOUS

## 2014-09-21 NOTE — Patient Instructions (Signed)
[facility] Discharge Instructions for Patients Receiving Chemotherapy  Today you received the following chemotherapy agents: Leucovorin and 5FU   To help prevent nausea and vomiting after your treatment, we encourage you to take your nausea medication as prescribed.    If you develop nausea and vomiting that is not controlled by your nausea medication, call the clinic.   BELOW ARE SYMPTOMS THAT SHOULD BE REPORTED IMMEDIATELY:  *FEVER GREATER THAN 100.5 F  *CHILLS WITH OR WITHOUT FEVER  NAUSEA AND VOMITING THAT IS NOT CONTROLLED WITH YOUR NAUSEA MEDICATION  *UNUSUAL SHORTNESS OF BREATH  *UNUSUAL BRUISING OR BLEEDING  TENDERNESS IN MOUTH AND THROAT WITH OR WITHOUT PRESENCE OF ULCERS  *URINARY PROBLEMS  *BOWEL PROBLEMS  UNUSUAL RASH Items with * indicate a potential emergency and should be followed up as soon as possible.  Feel free to call the clinic you have any questions or concerns. The clinic phone number is [phone].    

## 2014-09-21 NOTE — Progress Notes (Signed)
Radiation Oncology         [phone] ________________________________  Name: Tammy Hughes MRN: [id number]  Date: 09/21/2014  DOB: Mar 22, 1933  TK:[id number], Hilliard Clark, MD  Ladell Pier, MD     REFERRING PHYSICIAN: Ladell Pier, MD   DIAGNOSIS: The encounter diagnosis was Gastric cancer-s/p gastrectomy 07/20/14.   HISTORY OF PRESENT ILLNESS::Tammy Hughes is a 81 y.o. female who is seen for an initial consultation visit regarding the patient's diagnosis of gastric cancer.  The patient presented with a finding on endoscopy during evaluation of an esophageal stricture. This procedure revealed a 3x4 cm friable mass along the lesser curvature of the stomach.  Biopsy was positive for adenocarcinoma.  The patient proceeded with a proximal gastrectomy and feeding jejunostomy on 07/20/2014. Final pathology revealed a pT2N1 tumor. 1 out of 5 lymph nodes were positive.  Perineural invasion was identified.  The resection margins were negative.  The patient has proceeded with postoperative chemotherapy through Dr. Benay Spice. She states she has done OK with this. She has had some nausea. She states she sees Dr. Barry Dienes Friday and plans to discuss possible removal of the feeding tube.  I have been asked to see her today to discuss possible adjuvant radiation treatment.    PREVIOUS RADIATION THERAPY: No   PAST MEDICAL HISTORY:  has a past medical history of HYPERCHOLESTEROLEMIA (02/01/2008); ANXIETY (03/08/2008); PERIPHERAL NEUROPATHY (02/23/2007); HYPERTENSION (03/08/2008); GERD (02/23/2007); DIVERTICULOSIS, COLON (03/08/2008); OSTEOARTHRITIS (02/23/2007); OSTEOPOROSIS (02/23/2007); Urolithiasis; Allergy; Cataract; Depression; Stomach cancer (07/20/14); and DIABETES MELLITUS, TYPE I (02/23/2007).     PAST SURGICAL HISTORY: Past Surgical History  Procedure Laterality Date  . Cholecystectomy  1995  . Esophagogastroduodenoscopy  06/08/2002  . Gastrectomy N/A 07/20/2014    Procedure: PROXIMAL GASTRECTOMY;   Surgeon: Stark Klein, MD;  Location: [facility];  Service: General;  Laterality: N/A;  . Laparoscopy N/A 07/20/2014    Procedure: LAPAROSCOPY DIAGNOSTIC;  Surgeon: Stark Klein, MD;  Location: [facility];  Service: General;  Laterality: N/A;  . Gastrojejunostomy N/A 07/20/2014    Procedure: GASTROJEJUNOSTOMY;  Surgeon: Stark Klein, MD;  Location: [facility];  Service: General;  Laterality: N/A;     FAMILY HISTORY: family history includes Cancer in her father and sister; Diabetes in her sister and sister.   SOCIAL HISTORY:  reports that she has never smoked. She has never used smokeless tobacco. She reports that she does not drink alcohol or use illicit drugs.   ALLERGIES: Pirfenidone and Aspirin   MEDICATIONS:  Current Outpatient Prescriptions  Medication Sig Dispense Refill  . clopidogrel (PLAVIX) 75 MG tablet Take 1 tablet (75 mg total) by mouth daily. 90 tablet 1  . esomeprazole (NEXIUM) 40 MG capsule Take 1 capsule (40 mg total) by mouth daily at 12 noon. 90 capsule 2  . HYDROcodone-acetaminophen (HYCET) 7.5-325 mg/15 ml solution Take 15 mLs by mouth every 4 (four) hours as needed for moderate pain. 240 mL 0  . insulin aspart protamine- aspart (NOVOLOG MIX 70/30) (70-30) 100 UNIT/ML injection 50 units with breakfast, and 30 units with the evening meal. 90 mL 3  . Insulin Pen Needle 31G X 8 MM MISC USE AS DIRECTED WITH INSULIN TWICE DAILY Dx: 250.01 200 each prn  . LORazepam (ATIVAN) 1 MG tablet Place 1 tablet (1 mg total) into feeding tube 3 (three) times daily as needed for anxiety. 30 tablet 0  . metoprolol tartrate (LOPRESSOR) 25 MG tablet Take 1 tablet (25 mg total) by mouth 2 (two) times daily. 180 tablet 1  .  promethazine (PHENERGAN) 12.5 MG tablet Take 1 tablet (12.5 mg total) by mouth every 4 (four) hours as needed for nausea or vomiting. 50 tablet 2  . rosuvastatin (CRESTOR) 10 MG tablet Take 1 tablet (10 mg total) by mouth daily. 90 tablet 3  . Calcium Carbonate-Vit D-Min (CALCIUM 600 +  MINERALS) 600-200 MG-UNIT TABS Take 1 tablet by mouth daily.      Marland Kitchen glucose blood (BAYER CONTOUR TEST) test strip Use to check blood sugar 2/day. Dx code E11.9 (Patient not taking: Reported on 09/21/2014) 100 each 2  . lactulose (CHRONULAC) 10 GM/15ML solution Take 45 mLs (30 g total) by mouth 2 (two) times daily. (Patient not taking: Reported on 09/21/2014) 946 mL 11   No current facility-administered medications for this encounter.   Facility-Administered Medications Ordered in Other Encounters  Medication Dose Route Frequency Provider Last Rate Last Dose  . leucovorin 680 mg in dextrose 5 % 250 mL infusion  400 mg/m2 (Treatment Plan Actual) Intravenous Once Ladell Pier, MD 142 mL/hr at 09/21/14 1441 680 mg at 09/21/14 1441     REVIEW OF SYSTEMS:  A 15 point review of systems is documented in the electronic medical record. This was obtained by the nursing staff. However, I reviewed this with the patient to discuss relevant findings and make appropriate changes.  Pertinent items are noted in HPI.    PHYSICAL EXAM:  height is 5' (1.524 m) and weight is 152 lb 12.8 oz (69.31 kg). Her oral temperature is 97.6 F (36.4 C). Her blood pressure is 163/76 and her pulse is 65. Her respiration is 20 and oxygen saturation is 98%.   ECOG = 1  0 - Asymptomatic (Fully active, able to carry on all predisease activities without restriction)  1 - Symptomatic but completely ambulatory (Restricted in physically strenuous activity but ambulatory and able to carry out work of a light or sedentary nature. For example, light housework, office work)  2 - Symptomatic, <50% in bed during the day (Ambulatory and capable of all self care but unable to carry out any work activities. Up and about more than 50% of waking hours)  3 - Symptomatic, >50% in bed, but not bedbound (Capable of only limited self-care, confined to bed or chair 50% or more of waking hours)  4 - Bedbound (Completely disabled. Cannot carry on any  self-care. Totally confined to bed or chair)  5 - Death   Eustace Pen MM, Creech RH, Tormey DC, et al. [phone]). "Toxicity and response criteria of the [facility] Group". [place] Oncol. 5 (6): 649-55  General: Well-developed, in no acute distress HEENT: Normocephalic, atraumatic; oral cavity clear Neck: Supple without any lymphadenopathy Cardiovascular: Regular rate and rhythm Respiratory: Clear to auscultation bilaterally GI: Soft, nontender, normal bowel sounds, feeding tube in place; the incision shows no infection - it is still healing especially superiorly where a scab came off recently Extremities: No edema present Neuro: No focal deficits     LABORATORY DATA:  Lab Results  Component Value Date   WBC 6.4 09/21/2014   HGB 12.2 09/21/2014   HCT 38.9 09/21/2014   MCV 85.8 09/21/2014   PLT 362 09/21/2014   Lab Results  Component Value Date   NA 136 09/21/2014   K 4.5 09/21/2014   CL 98 08/05/2014   CO2 23 09/21/2014   Lab Results  Component Value Date   ALT 20 09/14/2014   AST 35* 09/14/2014   ALKPHOS 122 09/14/2014   BILITOT 0.27  09/14/2014      RADIOGRAPHY: Ct Abdomen Pelvis W Contrast  09/02/2014   CLINICAL DATA:  LEFT FLANK PAIN AND LOW BACK PAIN X 6 DAYS // PT HAD CONSTIPATION // PAIN HAS IMPROVED AFTER HAVING BM // PARTIAL GASTRECTOMY ON 07/20/14 C/O GASTRIC CA // PREV IN PACS // LOW GRADE FEVER LAST WEEK X 1 DAY //  EXAM: CT ABDOMEN AND PELVIS WITH CONTRAST  TECHNIQUE: Multidetector CT imaging of the abdomen and pelvis was performed using the standard protocol following bolus administration of intravenous contrast.  CONTRAST:  158mL OMNIPAQUE IOHEXOL 300 MG/ML  SOLN  COMPARISON:  06/08/2014  FINDINGS: Lung bases demonstrate mild peripheral increased interstitial markings unchanged. No effusion. There is fluid within the distal esophagus.  Abdominal images demonstrate evidence of a previous cholecystectomy. There is a percutaneous jejunostomy tube  entering the left anterior abdomen with tip over the posterior right mid abdomen. There is evidence of patient's recent partial gastrectomy. There is a focal fluid collection in the midline abutting the greater curvature of the distal body of the stomach measuring 2.2 x 2.9 x 3.4 cm in AP, transverse and longitudinal dimensions. There is mild stranding of the adjacent fat. This may represent a non infected postoperative collection versus abscess. There is mild heterogeneous low attenuation within the adjacent lateral segment of the left lobe the liver likely postsurgical although cannot exclude contiguous spread of infectious process. No evidence of free peritoneal air.  The spleen, pancreas and adrenal glands are within normal. Kidneys are normal in size, shape and position without hydronephrosis. 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 Stable 1.6 cm cyst over the upper pole left kidney. A few small bilateral sub cm renal cortical hypodensities likely cysts but too small to characterize. Ureters are within normal. There is calcified plaque over the abdominal aorta and iliac arteries. There is moderate diverticulosis throughout the colon. Appendix is normal.  Pelvic images demonstrate the uterus, ovaries, bladder and rectum to within normal. There are degenerative changes of the spine and hips.  IMPRESSION: Postsurgical changes compatible patient's recent partial gastrectomy. There is a focal fluid collection in the midline abutting the distal greater curvature of the stomach measuring 2.2 x 2.9 x 3.4 cm. There is mild stranding of the adjacent fat. This may represent an abscess versus non infectious postoperative collection. Mild heterogeneous low attenuation of the adjacent lateral segment of the left lobe of the liver which may be postsurgical, although cannot exclude contiguous spread of infection.  Percutaneous jejunostomy tube in adequate position.  Fluid within the distal  esophagus which may be due to reflux.  Other chronic stable ancillary findings as described.  These results were called by telephone at the time of interpretation on 09/02/2014 at 6:25 pm to Dr. Stark Klein , who verbally acknowledged these results.   Electronically Signed   By: Marin Olp M.D.   On: 09/02/2014 18:25       IMPRESSION:  The patient is s/p proximal gastrectomy for a T2N1M0 gastric adenocarcinoma. She is proceeding with postoperative chemotherapy. She is an appropriate candidate I believe for adjuvant radiation treatment after an initial 4 weeks of chemotherapy.  We discussed therefore a potential 5 1/2 week course of adjuvant radiation treatment. We discussed the possible benefit of such a treatment in terms of improved local/regional control. We also discussed the possible side effects and risks. All of her question were answered.    PLAN: The patient will be scheduled for a  simulation within the next couple of weeks.    I spent 60 minutes face to face with the patient and more than 50% of that time was spent in counseling and/or coordination of care.    ________________________________   Jodelle Gross, MD, PhD   **Disclaimer: This note was dictated with voice recognition software. Similar sounding words can inadvertently be transcribed and this note may contain transcription errors which may not have been corrected upon publication of note.**

## 2014-09-21 NOTE — Progress Notes (Signed)
Please see the Nurse Progress Note in the MD Initial Consult Encounter for this patient. 

## 2014-09-22 ENCOUNTER — Telehealth: Payer: Self-pay | Admitting: Endocrinology

## 2014-09-22 ENCOUNTER — Other Ambulatory Visit: Payer: Self-pay | Admitting: *Deleted

## 2014-09-22 DIAGNOSIS — F329 Major depressive disorder, single episode, unspecified: Secondary | ICD-10-CM | POA: Diagnosis not present

## 2014-09-22 DIAGNOSIS — E1143 Type 2 diabetes mellitus with diabetic autonomic (poly)neuropathy: Secondary | ICD-10-CM | POA: Diagnosis not present

## 2014-09-22 DIAGNOSIS — C169 Malignant neoplasm of stomach, unspecified: Secondary | ICD-10-CM

## 2014-09-22 DIAGNOSIS — Z483 Aftercare following surgery for neoplasm: Secondary | ICD-10-CM | POA: Diagnosis not present

## 2014-09-22 DIAGNOSIS — I10 Essential (primary) hypertension: Secondary | ICD-10-CM | POA: Diagnosis not present

## 2014-09-22 DIAGNOSIS — I472 Ventricular tachycardia: Secondary | ICD-10-CM | POA: Diagnosis not present

## 2014-09-22 NOTE — Telephone Encounter (Signed)
Home nurse calling again wants order for home health aid for 3 times a week for 3 wks.

## 2014-09-22 NOTE — Telephone Encounter (Signed)
HH is ok Please ask HH to send the report of incision to surgery office.

## 2014-09-22 NOTE — Telephone Encounter (Signed)
At home nurse called and would like for Dr. Loanne Drilling to know that her incision had opened at the top    Thank you

## 2014-09-23 ENCOUNTER — Encounter: Payer: Self-pay | Admitting: *Deleted

## 2014-09-23 NOTE — Telephone Encounter (Signed)
Left message on machine for patient to have her home health nurse to give [place] a call back for verbal orders.

## 2014-09-23 NOTE — Progress Notes (Signed)
[facility] Psychosocial Distress Screening Clinical Social Work  Clinical Social Work was referred by distress screening protocol.  The patient scored a 6 on the Psychosocial Distress Thermometer which indicates moderate distress. Clinical Social Worker phoned pt to assess for distress and other psychosocial needs. Pt reports transportation is the biggest issue for her is transportation. Pt lives on the other side of [place] and is about an hour away. Pt has to get family to come from [place] to go get her and bring her back. CSW discussed gas cards as an assistance option, but explained there were no other resources to go get her. Pt has family that are are supportive as well. She shared she feels somewhat depressed and feels this is related to financial concerns. Pt plans to try to meet with the financial advocates and CSW provided contact information. Pt agrees to [place] team following up at future appointments.   ONCBCN DISTRESS SCREENING 09/21/2014  Screening Type Initial Screening  Distress experienced in past week (1-10) 6  Practical problem type Transportation;Food  Emotional problem type Depression;Nervousness/Anxiety  Physical Problem type Pain;Nausea/vomiting;Sleep/insomnia;Loss of appetitie  Physician notified of physical symptoms Yes  Referral to clinical social work Yes  Referral to dietition Yes    Clinical Social Worker follow up needed: Yes.    If yes, follow up plan: See above Loren Racer, [place] Worker [place]  [facility] Phone: [phone] Fax: [phone]

## 2014-09-25 ENCOUNTER — Other Ambulatory Visit: Payer: Self-pay | Admitting: Oncology

## 2014-09-26 ENCOUNTER — Ambulatory Visit: Payer: Medicare Other

## 2014-09-26 ENCOUNTER — Ambulatory Visit: Payer: Medicare Other | Admitting: Radiation Oncology

## 2014-09-27 DIAGNOSIS — F329 Major depressive disorder, single episode, unspecified: Secondary | ICD-10-CM | POA: Diagnosis not present

## 2014-09-27 DIAGNOSIS — I10 Essential (primary) hypertension: Secondary | ICD-10-CM | POA: Diagnosis not present

## 2014-09-27 DIAGNOSIS — I472 Ventricular tachycardia: Secondary | ICD-10-CM | POA: Diagnosis not present

## 2014-09-27 DIAGNOSIS — C169 Malignant neoplasm of stomach, unspecified: Secondary | ICD-10-CM | POA: Diagnosis not present

## 2014-09-27 DIAGNOSIS — E1143 Type 2 diabetes mellitus with diabetic autonomic (poly)neuropathy: Secondary | ICD-10-CM | POA: Diagnosis not present

## 2014-09-27 DIAGNOSIS — Z483 Aftercare following surgery for neoplasm: Secondary | ICD-10-CM | POA: Diagnosis not present

## 2014-09-28 ENCOUNTER — Ambulatory Visit (HOSPITAL_BASED_OUTPATIENT_CLINIC_OR_DEPARTMENT_OTHER): Payer: Medicare Other

## 2014-09-28 ENCOUNTER — Ambulatory Visit (INDEPENDENT_AMBULATORY_CARE_PROVIDER_SITE_OTHER): Payer: Medicare Other | Admitting: Endocrinology

## 2014-09-28 ENCOUNTER — Other Ambulatory Visit: Payer: Medicare Other

## 2014-09-28 ENCOUNTER — Telehealth: Payer: Self-pay | Admitting: Oncology

## 2014-09-28 ENCOUNTER — Encounter: Payer: Self-pay | Admitting: Endocrinology

## 2014-09-28 ENCOUNTER — Ambulatory Visit (HOSPITAL_BASED_OUTPATIENT_CLINIC_OR_DEPARTMENT_OTHER): Payer: Medicare Other | Admitting: Oncology

## 2014-09-28 ENCOUNTER — Other Ambulatory Visit (HOSPITAL_BASED_OUTPATIENT_CLINIC_OR_DEPARTMENT_OTHER): Payer: Medicare Other

## 2014-09-28 VITALS — BP 128/60 | HR 68 | Temp 98.2°F | Wt 148.0 lb

## 2014-09-28 VITALS — BP 135/57 | HR 63 | Temp 97.6°F | Resp 18 | Ht 60.0 in | Wt 147.9 lb

## 2014-09-28 DIAGNOSIS — C169 Malignant neoplasm of stomach, unspecified: Secondary | ICD-10-CM

## 2014-09-28 DIAGNOSIS — C165 Malignant neoplasm of lesser curvature of stomach, unspecified: Secondary | ICD-10-CM

## 2014-09-28 DIAGNOSIS — Z5111 Encounter for antineoplastic chemotherapy: Secondary | ICD-10-CM

## 2014-09-28 DIAGNOSIS — E1142 Type 2 diabetes mellitus with diabetic polyneuropathy: Secondary | ICD-10-CM

## 2014-09-28 DIAGNOSIS — E1140 Type 2 diabetes mellitus with diabetic neuropathy, unspecified: Secondary | ICD-10-CM | POA: Diagnosis not present

## 2014-09-28 LAB — COMPREHENSIVE METABOLIC PANEL (CC13)
ALT: 21 U/L (ref 0–55)
AST: 30 U/L (ref 5–34)
Albumin: 3.4 g/dL — ABNORMAL LOW (ref 3.5–5.0)
Alkaline Phosphatase: 71 U/L (ref 40–150)
Anion Gap: 11 mEq/L (ref 3–11)
BILIRUBIN TOTAL: 0.46 mg/dL (ref 0.20–1.20)
BUN: 13.6 mg/dL (ref 7.0–26.0)
CHLORIDE: 105 meq/L (ref 98–109)
CO2: 24 mEq/L (ref 22–29)
CREATININE: 0.8 mg/dL (ref 0.6–1.1)
Calcium: 10.1 mg/dL (ref 8.4–10.4)
EGFR: 76 mL/min/{1.73_m2} — ABNORMAL LOW (ref >90)
Glucose: 115 mg/dl (ref 70–140)
Potassium: 4.1 mEq/L (ref 3.5–5.1)
Sodium: 140 mEq/L (ref 136–145)
Total Protein: 8.2 g/dL (ref 6.4–8.3)

## 2014-09-28 LAB — CBC WITH DIFFERENTIAL/PLATELET
BASO%: 0.5 % (ref 0.0–2.0)
Basophils Absolute: 0 10*3/uL (ref 0.0–0.1)
EOS ABS: 0.2 10*3/uL (ref 0.0–0.5)
EOS%: 2.4 % (ref 0.0–7.0)
HCT: 37.9 % (ref 34.8–46.6)
HGB: 12 g/dL (ref 11.6–15.9)
LYMPH%: 37.8 % (ref 14.0–49.7)
MCH: 27.7 pg (ref 25.1–34.0)
MCHC: 31.8 g/dL (ref 31.5–36.0)
MCV: 87.1 fL (ref 79.5–101.0)
MONO#: 0.5 10*3/uL (ref 0.1–0.9)
MONO%: 6.7 % (ref 0.0–14.0)
NEUT%: 52.6 % (ref 38.4–76.8)
NEUTROS ABS: 3.6 10*3/uL (ref 1.5–6.5)
Platelets: 362 10*3/uL (ref 145–400)
RBC: 4.35 10*6/uL (ref 3.70–5.45)
RDW: 16.9 % — AB (ref 11.2–14.5)
WBC: 6.8 10*3/uL (ref 3.9–10.3)
lymph#: 2.6 10*3/uL (ref 0.9–3.3)

## 2014-09-28 MED ORDER — INSULIN ASPART PROT & ASPART (70-30 MIX) 100 UNIT/ML ~~LOC~~ SUSP: 40.0000 [IU] | Freq: Two times a day (BID) | SUBCUTANEOUS | Status: DC

## 2014-09-28 MED ORDER — SODIUM CHLORIDE 0.9 % IV SOLN
Freq: Once | INTRAVENOUS | Status: AC
  Administered 2014-09-28: 11:00:00 via INTRAVENOUS

## 2014-09-28 MED ORDER — LEUCOVORIN CALCIUM INJECTION 350 MG
400.0000 mg/m2 | Freq: Once | INTRAVENOUS | Status: AC
  Administered 2014-09-28: 680 mg via INTRAVENOUS
  Filled 2014-09-28: qty 34

## 2014-09-28 MED ORDER — ONDANSETRON HCL 40 MG/20ML IJ SOLN
Freq: Once | INTRAMUSCULAR | Status: AC
  Administered 2014-09-28: 11:00:00 via INTRAVENOUS
  Filled 2014-09-28: qty 4

## 2014-09-28 MED ORDER — FLUOROURACIL CHEMO INJECTION 2.5 GM/50ML
400.0000 mg/m2 | Freq: Once | INTRAVENOUS | Status: AC
  Administered 2014-09-28: 700 mg via INTRAVENOUS
  Filled 2014-09-28: qty 14

## 2014-09-28 NOTE — Telephone Encounter (Signed)
gv and printed appt sched and avs for pt for March...LVM to T[facility] in IR

## 2014-09-28 NOTE — Patient Instructions (Signed)
[facility] Discharge Instructions for Patients Receiving Chemotherapy  Today you received the following chemotherapy agents Leucovorin and 5FU.  To help prevent nausea and vomiting after your treatment, we encourage you to take your nausea medication as prescribed.   If you develop nausea and vomiting that is not controlled by your nausea medication, call the clinic.   BELOW ARE SYMPTOMS THAT SHOULD BE REPORTED IMMEDIATELY:  *FEVER GREATER THAN 100.5 F  *CHILLS WITH OR WITHOUT FEVER  NAUSEA AND VOMITING THAT IS NOT CONTROLLED WITH YOUR NAUSEA MEDICATION  *UNUSUAL SHORTNESS OF BREATH  *UNUSUAL BRUISING OR BLEEDING  TENDERNESS IN MOUTH AND THROAT WITH OR WITHOUT PRESENCE OF ULCERS  *URINARY PROBLEMS  *BOWEL PROBLEMS  UNUSUAL RASH Items with * indicate a potential emergency and should be followed up as soon as possible.  Feel free to call the clinic you have any questions or concerns. The clinic phone number is [phone].  Please show the [place] at check to the Emergency Department and triage nurse.

## 2014-09-28 NOTE — Patient Instructions (Addendum)
Please change the insulin to 40 units, twice a day check your blood sugar twice a day.  vary the time of day when you check, between before the 3 meals, and at bedtime.  also check if you have symptoms of your blood sugar being too high or too low.  please keep a record of the readings and bring it to your next appointment here.  You can write it on any piece of paper.  please call us sooner if your blood sugar goes below 70, or if you have a lot of readings over 200.   Please come back for a follow-up appointment in 6 weeks.  Please take lactulose twice a day, via the tube.  i have sent a prescription to your pharmacy.  Please take the lorazepam as needed for sleep.  Please continue to take the promethazine as needed for nausea.

## 2014-09-28 NOTE — Progress Notes (Signed)
  [facility] OFFICE PROGRESS NOTE   Diagnosis: Gastric  INTERVAL HISTORY:   She returns as scheduled. She has completed 2 weeks of 5-FU/leucovorin.  She reports mild nausea following chemotherapy. No emesis.  She is eating and no longer using the feeding tube.  She is more active at home.  Objective:  Vital signs in last 24 hours:  Blood pressure 135/57, pulse 63, temperature 97.6 F (36.4 C), temperature source Oral, resp. rate 18, height 5' (1.524 m), weight 147 lb 14.4 oz (67.087 kg).    HEENT: no thrush or ulcers Resp: lungs with rales at bases Cardio: rrr GI: soft and nontender, feeding tube site without evidence of infection Vascular: no leg edema   Lab Results:  Lab Results  Component Value Date   WBC 6.8 09/28/2014   HGB 12.0 09/28/2014   HCT 37.9 09/28/2014   MCV 87.1 09/28/2014   PLT 362 09/28/2014   NEUTROABS 3.6 09/28/2014     Medications: I have reviewed the patient's current medications.  Assessment/Plan: 1. Gastric cancer, status post an endoscopic biopsy of a lesser curvature mass on 05/31/2014 confirming adenocarcinoma  Staging CTs of the chest and abdomen on 06/08/2014 revealed no evidence of metastatic disease  Proximal gastrectomy and feeding jejunostomy 07/20/2014, pT2,pN1  Adjuvant 5-Fu/leucovorin starting 09/14/2014  2. Pulmonary fibrosis  3. Esophageal stricture, status post a dilatation procedure 05/31/2014  4. Diabetes  5. Postoperative ventricle tachycardia, NSTEMI January 2016  6. Hospital-acquired pneumonia January 2016  7. CT abdomen/pelvis 09/02/2014 with postsurgical changes. Focal fluid collection in the midline abutting the distal greater curvature of the stomach measuring 2.2 x 2.9 x 3.4 cm. Mild stranding in the adjacent fat.    Disposition:  She appears well. Plan is to complete 2 more weeks of 5-Fu/leucovorin.  She will begin radiation and infusional 5-FU 10/17/2014. She doe not want to take  xeloda.  [name], MD  09/28/2014  7:34 PM

## 2014-09-28 NOTE — Progress Notes (Signed)
Subjective:    Patient ID: Tammy Hughes, female    DOB: [birth year], 81 y.o.   MRN: [id number]  HPI The state of at least three ongoing medical problems is addressed today, with interval history of each noted here: Pt returns for f/u of diabetes mellitus: DM type: Insulin-requiring type 2 Dx'ed: 0086 Complications: polyneuropathy  Therapy: insulin since 2005 GDM: never DKA: never Severe hypoglycemia: never Pancreatitis: never Other: she chose bid premixed insulin Interval history: hx is from pt and dtr, who says chemo does not seem to affect cbg's.  no cbg record, but states cbg's are well-controlled.  It is lowest in the afternoon.  She takes tube-feeding overnight.  she eats very little during the day.  She gets chemotx every Wednesday.  She has nausea, but phenergan helps. abd pain: she now seldom takes pain med.  Past Medical History  Diagnosis Date  . HYPERCHOLESTEROLEMIA 02/01/2008  . ANXIETY 03/08/2008  . PERIPHERAL NEUROPATHY 02/23/2007  . HYPERTENSION 03/08/2008  . GERD 02/23/2007  . DIVERTICULOSIS, COLON 03/08/2008  . OSTEOARTHRITIS 02/23/2007  . OSTEOPOROSIS 02/23/2007  . Urolithiasis   . Allergy     SEASONAL  . Cataract     REMOVED BILATERAL  . Depression   . Stomach cancer 07/20/14    invasive adenocarcinoma w/signet rings features  . DIABETES MELLITUS, TYPE I 02/23/2007    Past Surgical History  Procedure Laterality Date  . Cholecystectomy  1995  . Esophagogastroduodenoscopy  06/08/2002  . Gastrectomy N/A 07/20/2014    Procedure: PROXIMAL GASTRECTOMY;  Surgeon: Stark Klein, MD;  Location: [facility];  Service: General;  Laterality: N/A;  . Laparoscopy N/A 07/20/2014    Procedure: LAPAROSCOPY DIAGNOSTIC;  Surgeon: Stark Klein, MD;  Location: [facility];  Service: General;  Laterality: N/A;  . Gastrojejunostomy N/A 07/20/2014    Procedure: GASTROJEJUNOSTOMY;  Surgeon: Stark Klein, MD;  Location: [facility];  Service: General;  Laterality: N/A;    History   Social History  .  Marital Status: Married    Spouse Name: N/A  . Number of Children: N/A  . Years of Education: N/A   Occupational History  . Retired    Social History Main Topics  . Smoking status: Never Smoker   . Smokeless tobacco: Never Used  . Alcohol Use: No  . Drug Use: No  . Sexual Activity: Not on file   Other Topics Concern  . Not on file   Social History Narrative   Dentist          Current Outpatient Prescriptions on File Prior to Visit  Medication Sig Dispense Refill  . Calcium Carbonate-Vit D-Min (CALCIUM 600 + MINERALS) 600-200 MG-UNIT TABS Take 1 tablet by mouth daily.      . clopidogrel (PLAVIX) 75 MG tablet Take 1 tablet (75 mg total) by mouth daily. 90 tablet 1  . esomeprazole (NEXIUM) 40 MG capsule Take 1 capsule (40 mg total) by mouth daily at 12 noon. 90 capsule 2  . glucose blood (BAYER CONTOUR TEST) test strip Use to check blood sugar 2/day. Dx code E11.9 100 each 2  . Insulin Pen Needle 31G X 8 MM MISC USE AS DIRECTED WITH INSULIN TWICE DAILY Dx: 250.01 200 each prn  . lactulose (CHRONULAC) 10 GM/15ML solution Take 45 mLs (30 g total) by mouth 2 (two) times daily. 946 mL 11  . metoprolol tartrate (LOPRESSOR) 25 MG tablet Take 1 tablet (25 mg total) by mouth 2 (two) times daily. 180 tablet 1  . promethazine (  PHENERGAN) 12.5 MG tablet Take 1 tablet (12.5 mg total) by mouth every 4 (four) hours as needed for nausea or vomiting. 50 tablet 2  . rosuvastatin (CRESTOR) 10 MG tablet Take 1 tablet (10 mg total) by mouth daily. 90 tablet 3   No current facility-administered medications on file prior to visit.    Allergies  Allergen Reactions  . Pirfenidone Diarrhea and Nausea And Vomiting  . Aspirin     rash    Family History  Problem Relation Age of Onset  . Cancer Father     Prostate Cancer  . Cancer Sister     Breast Cancer  . Diabetes Sister   . Diabetes Sister     BP 128/60 mmHg  Pulse 68  Temp(Src) 98.2 F (36.8 C) (Oral)  Wt 148 lb  (67.132 kg)  SpO2 94%    Review of Systems Denies weight change.  She has insomnia.    Objective:   Physical Exam VITAL SIGNS:  See vs page GENERAL: no distress Pulses: dorsalis pedis intact bilat.   MSK: no deformity of the feet CV: no leg edema Skin:  no ulcer on the feet.  normal color and temp on the feet. Neuro: sensation is intact to touch on the feet.       Assessment & Plan:  abd pain, well-controlled Insomnia: new.  In the setting of cancer, we should accept the risk of several controled substances together Nausea: well-controlled DM: Based on the pattern of her cbg's, she needs some adjustment in her therapy.   Patient is advised the following: Patient Instructions  Please change the insulin to 40 units, twice a day check your blood sugar twice a day.  vary the time of day when you check, between before the 3 meals, and at bedtime.  also check if you have symptoms of your blood sugar being too high or too low.  please keep a record of the readings and bring it to your next appointment here.  You can write it on any piece of paper.  please call us sooner if your blood sugar goes below 70, or if you have a lot of readings over 200.   Please come back for a follow-up appointment in 6 weeks.  Please take lactulose twice a day, via the tube.  i have sent a prescription to your pharmacy.  Please take the lorazepam as needed for sleep.  Please continue to take the promethazine as needed for nausea.

## 2014-09-29 ENCOUNTER — Telehealth: Payer: Self-pay

## 2014-09-29 MED ORDER — HYDROCODONE-ACETAMINOPHEN 7.5-325 MG/15ML PO SOLN: 15.0000 mL | ORAL | Status: DC | PRN

## 2014-09-29 NOTE — Telephone Encounter (Signed)
Contacted pt's daughter and advised Rx is ready for pick up. Rx placed up front.

## 2014-09-29 NOTE — Telephone Encounter (Signed)
Pt's daughter called stating the pt is running out of her Hydrocodone(lquid form). Since taking her Chemo yesterday the pt has been having severe back pain.  Rx was last written on 08/17/2014. Thanks!

## 2014-09-29 NOTE — Telephone Encounter (Signed)
i printed 

## 2014-10-02 ENCOUNTER — Other Ambulatory Visit: Payer: Self-pay | Admitting: Oncology

## 2014-10-04 DIAGNOSIS — E1143 Type 2 diabetes mellitus with diabetic autonomic (poly)neuropathy: Secondary | ICD-10-CM | POA: Diagnosis not present

## 2014-10-04 DIAGNOSIS — I472 Ventricular tachycardia: Secondary | ICD-10-CM | POA: Diagnosis not present

## 2014-10-04 DIAGNOSIS — I10 Essential (primary) hypertension: Secondary | ICD-10-CM | POA: Diagnosis not present

## 2014-10-04 DIAGNOSIS — F329 Major depressive disorder, single episode, unspecified: Secondary | ICD-10-CM | POA: Diagnosis not present

## 2014-10-04 DIAGNOSIS — C169 Malignant neoplasm of stomach, unspecified: Secondary | ICD-10-CM | POA: Diagnosis not present

## 2014-10-04 DIAGNOSIS — Z483 Aftercare following surgery for neoplasm: Secondary | ICD-10-CM | POA: Diagnosis not present

## 2014-10-05 ENCOUNTER — Ambulatory Visit (HOSPITAL_BASED_OUTPATIENT_CLINIC_OR_DEPARTMENT_OTHER): Payer: Medicare Other

## 2014-10-05 ENCOUNTER — Telehealth: Payer: Self-pay | Admitting: *Deleted

## 2014-10-05 ENCOUNTER — Ambulatory Visit: Payer: Medicare Other | Admitting: Nutrition

## 2014-10-05 ENCOUNTER — Other Ambulatory Visit (HOSPITAL_BASED_OUTPATIENT_CLINIC_OR_DEPARTMENT_OTHER): Payer: Medicare Other

## 2014-10-05 ENCOUNTER — Other Ambulatory Visit: Payer: Self-pay | Admitting: *Deleted

## 2014-10-05 ENCOUNTER — Ambulatory Visit
Admission: RE | Admit: 2014-10-05 | Discharge: 2014-10-05 | Disposition: A | Discharge status: Home / Self Care | Payer: Medicare Other | Source: Ambulatory Visit | Attending: Radiation Oncology | Admitting: Radiation Oncology

## 2014-10-05 DIAGNOSIS — F419 Anxiety disorder, unspecified: Secondary | ICD-10-CM | POA: Diagnosis not present

## 2014-10-05 DIAGNOSIS — C165 Malignant neoplasm of lesser curvature of stomach, unspecified: Secondary | ICD-10-CM | POA: Diagnosis not present

## 2014-10-05 DIAGNOSIS — I10 Essential (primary) hypertension: Secondary | ICD-10-CM | POA: Diagnosis not present

## 2014-10-05 DIAGNOSIS — K219 Gastro-esophageal reflux disease without esophagitis: Secondary | ICD-10-CM | POA: Diagnosis not present

## 2014-10-05 DIAGNOSIS — C169 Malignant neoplasm of stomach, unspecified: Secondary | ICD-10-CM | POA: Diagnosis not present

## 2014-10-05 DIAGNOSIS — E780 Pure hypercholesterolemia: Secondary | ICD-10-CM | POA: Diagnosis not present

## 2014-10-05 DIAGNOSIS — Z510 Encounter for antineoplastic radiation therapy: Secondary | ICD-10-CM | POA: Diagnosis not present

## 2014-10-05 DIAGNOSIS — Z5111 Encounter for antineoplastic chemotherapy: Secondary | ICD-10-CM | POA: Diagnosis not present

## 2014-10-05 LAB — CBC WITH DIFFERENTIAL/PLATELET
BASO%: 0.5 % (ref 0.0–2.0)
Basophils Absolute: 0 10*3/uL (ref 0.0–0.1)
EOS ABS: 0.1 10*3/uL (ref 0.0–0.5)
EOS%: 2.1 % (ref 0.0–7.0)
HEMATOCRIT: 38.6 % (ref 34.8–46.6)
HGB: 12.5 g/dL (ref 11.6–15.9)
LYMPH%: 45.2 % (ref 14.0–49.7)
MCH: 28.2 pg (ref 25.1–34.0)
MCHC: 32.4 g/dL (ref 31.5–36.0)
MCV: 87.1 fL (ref 79.5–101.0)
MONO#: 0.5 10*3/uL (ref 0.1–0.9)
MONO%: 7.9 % (ref 0.0–14.0)
NEUT%: 44.3 % (ref 38.4–76.8)
NEUTROS ABS: 2.8 10*3/uL (ref 1.5–6.5)
NRBC: 0 % (ref 0–0)
PLATELETS: 276 10*3/uL (ref 145–400)
RBC: 4.43 10*6/uL (ref 3.70–5.45)
RDW: 17.8 % — ABNORMAL HIGH (ref 11.2–14.5)
WBC: 6.3 10*3/uL (ref 3.9–10.3)
lymph#: 2.8 10*3/uL (ref 0.9–3.3)

## 2014-10-05 MED ORDER — MIRTAZAPINE 15 MG PO TABS: 15.0000 mg | ORAL_TABLET | Freq: Every day | ORAL | Status: DC

## 2014-10-05 MED ORDER — FLUOROURACIL CHEMO INJECTION 2.5 GM/50ML
400.0000 mg/m2 | Freq: Once | INTRAVENOUS | Status: AC
  Administered 2014-10-05: 700 mg via INTRAVENOUS
  Filled 2014-10-05: qty 14

## 2014-10-05 MED ORDER — SODIUM CHLORIDE 0.9 % IV SOLN
Freq: Once | INTRAVENOUS | Status: AC
  Administered 2014-10-05: 12:00:00 via INTRAVENOUS
  Filled 2014-10-05: qty 4

## 2014-10-05 MED ORDER — SODIUM CHLORIDE 0.9 % IV SOLN
Freq: Once | INTRAVENOUS | Status: AC
  Administered 2014-10-05: 12:00:00 via INTRAVENOUS

## 2014-10-05 MED ORDER — LEUCOVORIN CALCIUM INJECTION 350 MG
400.0000 mg/m2 | Freq: Once | INTRAVENOUS | Status: AC
  Administered 2014-10-05: 680 mg via INTRAVENOUS
  Filled 2014-10-05: qty 34

## 2014-10-05 NOTE — Patient Instructions (Signed)
[facility] Discharge Instructions for Patients Receiving Chemotherapy  Today you received the following chemotherapy agents: Leucovorin, 5FU (push)  To help prevent nausea and vomiting after your treatment, we encourage you to take your nausea medication as prescribed by your physician.   If you develop nausea and vomiting that is not controlled by your nausea medication, call the clinic.   BELOW ARE SYMPTOMS THAT SHOULD BE REPORTED IMMEDIATELY:  *FEVER GREATER THAN 100.5 F  *CHILLS WITH OR WITHOUT FEVER  NAUSEA AND VOMITING THAT IS NOT CONTROLLED WITH YOUR NAUSEA MEDICATION  *UNUSUAL SHORTNESS OF BREATH  *UNUSUAL BRUISING OR BLEEDING  TENDERNESS IN MOUTH AND THROAT WITH OR WITHOUT PRESENCE OF ULCERS  *URINARY PROBLEMS  *BOWEL PROBLEMS  UNUSUAL RASH Items with * indicate a potential emergency and should be followed up as soon as possible.  Feel free to call the clinic you have any questions or concerns. The clinic phone number is [phone].  Please show the [facility] at check-in to the Emergency Department and triage nurse.

## 2014-10-05 NOTE — Progress Notes (Signed)
Pt arrived to unit with daughter; voices concern of PEG leaking bile; has been going on for a couple of days and was mentioned in radiation. Cheryl in radiation called up to assess and was able to provide red cap for PEG tube to prevent further leakage. Cap placed; will continue to assess site while in infusion.  1453- pt PEG clean/dry/intact and capped, off.

## 2014-10-05 NOTE — Progress Notes (Signed)
81 year old female diagnosed with gastric cancer status post gastrectomy and jejunostomy feeding tube placement.  She is a patient of Dr. Julieanne Manson.  Past medical history includes hypercholesterolemia, anxiety, hypertension, GERD, diverticulosis, osteoarthritis, osteoporosis, depression, and type 1 diabetes.  Medications include vitamin D/calcium, Nexium, insulin, Chronulac, Ativan, Phenergan, and Crestor.  Labs include albumin 3.4 and glucose 115 on March 16.  Height: 5 feet 0 inches. Weight: 147.9 pounds. Usual body weight: 163 pounds in December 2015. BMI: 28.88.  Met with patient and with patient's daughter, during infusion. Daughter is very upset that patient's jejunostomy feeding tube is still in place. Daughter reports patient has not used Jejunostomy feeding tube for nutrition in the past 10 days. Patient able to eat soft moist foods by mouth. Weight is relatively stable over the past 6 weeks. Daughter concerned patient relying on feeding tube for medications when she is able to swallow pills in applesauce. Patient refuses to drink Glucerna supplement because she does not like it. Daughter also complains that patient is depressed, and no one will order antidepressant medication.  Daughter is requesting Remeron.  Nutrition diagnosis: Unintended weight loss related to diagnosis of gastric cancer as evidenced by 9% weight loss over 3 months.  Intervention:  Educated patient to consume small frequent meals and snacks utilizing soft, high-protein foods.   Encouraged patient to communicate with family regarding what food she would enjoy. Provided fact sheets on soft moist protein foods. Teach back method used and contact information was provided. Communicated with GI navigator regarding daughter's request.  Monitoring, evaluation, goals: Patient will continue to tolerate oral intake to promote weight maintenance.  Next visit: Monday, April 4.  **Disclaimer: This note was  dictated with voice recognition software. Similar sounding words can inadvertently be transcribed and this note may contain transcription errors which may not have been corrected upon publication of note.**

## 2014-10-05 NOTE — Telephone Encounter (Signed)
Dr. Barry Dienes reporting that a daughter is very insistent on getting the feeding tube removed now. Asking for Dr. Benay Spice to evaluate and give his suggestion. Reporting she has diarrhea with every feeding-sees Ernestene Kiel, RD today at 12:45 in treatment area.  Weight has been fairly stable for last month or so, but she is concerned about pulling the tube before she finishes her treatment.

## 2014-10-05 NOTE — Progress Notes (Signed)
  Called to Ct sim, patient opeg tube leaing,Changed  Drain dressing soiled and changed  on abdomen where peg tube was, plugged 1 open tubing and where foil wrapped was leaking bile in plastic gag, placed 2x2 Gause over foil and replaced plastic bag,  ,patient going to med onc afterwards 10:26 AM

## 2014-10-06 ENCOUNTER — Telehealth: Payer: Self-pay | Admitting: *Deleted

## 2014-10-06 DIAGNOSIS — C169 Malignant neoplasm of stomach, unspecified: Secondary | ICD-10-CM | POA: Diagnosis not present

## 2014-10-06 DIAGNOSIS — E1143 Type 2 diabetes mellitus with diabetic autonomic (poly)neuropathy: Secondary | ICD-10-CM | POA: Diagnosis not present

## 2014-10-06 DIAGNOSIS — F329 Major depressive disorder, single episode, unspecified: Secondary | ICD-10-CM | POA: Diagnosis not present

## 2014-10-06 DIAGNOSIS — I472 Ventricular tachycardia: Secondary | ICD-10-CM | POA: Diagnosis not present

## 2014-10-06 DIAGNOSIS — Z483 Aftercare following surgery for neoplasm: Secondary | ICD-10-CM | POA: Diagnosis not present

## 2014-10-06 DIAGNOSIS — I10 Essential (primary) hypertension: Secondary | ICD-10-CM | POA: Diagnosis not present

## 2014-10-06 NOTE — Telephone Encounter (Signed)
Spoke with daughter about feeding tube and that she is most likely going to need it 2-3 weeks into her RT/chemo regimen, so it is best to leave it in for now. Would be much more difficult to place another tube since she has had the surgery. She understands and is doing better since the tube is no longer leaking with the new plug. Was also aware of the antidepressant script and is grateful. Reminded her that her mother needs to eat small frequent snacks, since she has had some of her stomach removed.

## 2014-10-07 DIAGNOSIS — C169 Malignant neoplasm of stomach, unspecified: Secondary | ICD-10-CM | POA: Diagnosis not present

## 2014-10-07 DIAGNOSIS — Z483 Aftercare following surgery for neoplasm: Secondary | ICD-10-CM | POA: Diagnosis not present

## 2014-10-07 DIAGNOSIS — I10 Essential (primary) hypertension: Secondary | ICD-10-CM | POA: Diagnosis not present

## 2014-10-07 DIAGNOSIS — Z510 Encounter for antineoplastic radiation therapy: Secondary | ICD-10-CM | POA: Diagnosis not present

## 2014-10-07 DIAGNOSIS — F419 Anxiety disorder, unspecified: Secondary | ICD-10-CM | POA: Diagnosis not present

## 2014-10-07 DIAGNOSIS — I472 Ventricular tachycardia: Secondary | ICD-10-CM | POA: Diagnosis not present

## 2014-10-07 DIAGNOSIS — K219 Gastro-esophageal reflux disease without esophagitis: Secondary | ICD-10-CM | POA: Diagnosis not present

## 2014-10-07 DIAGNOSIS — F329 Major depressive disorder, single episode, unspecified: Secondary | ICD-10-CM | POA: Diagnosis not present

## 2014-10-07 DIAGNOSIS — E780 Pure hypercholesterolemia: Secondary | ICD-10-CM | POA: Diagnosis not present

## 2014-10-07 DIAGNOSIS — E1143 Type 2 diabetes mellitus with diabetic autonomic (poly)neuropathy: Secondary | ICD-10-CM | POA: Diagnosis not present

## 2014-10-10 DIAGNOSIS — F329 Major depressive disorder, single episode, unspecified: Secondary | ICD-10-CM | POA: Diagnosis not present

## 2014-10-10 DIAGNOSIS — C169 Malignant neoplasm of stomach, unspecified: Secondary | ICD-10-CM | POA: Diagnosis not present

## 2014-10-10 DIAGNOSIS — E1143 Type 2 diabetes mellitus with diabetic autonomic (poly)neuropathy: Secondary | ICD-10-CM | POA: Diagnosis not present

## 2014-10-10 DIAGNOSIS — I472 Ventricular tachycardia: Secondary | ICD-10-CM | POA: Diagnosis not present

## 2014-10-10 DIAGNOSIS — F419 Anxiety disorder, unspecified: Secondary | ICD-10-CM | POA: Diagnosis not present

## 2014-10-10 DIAGNOSIS — I10 Essential (primary) hypertension: Secondary | ICD-10-CM | POA: Diagnosis not present

## 2014-10-13 DIAGNOSIS — C169 Malignant neoplasm of stomach, unspecified: Secondary | ICD-10-CM | POA: Diagnosis not present

## 2014-10-13 DIAGNOSIS — F329 Major depressive disorder, single episode, unspecified: Secondary | ICD-10-CM | POA: Diagnosis not present

## 2014-10-13 DIAGNOSIS — E1143 Type 2 diabetes mellitus with diabetic autonomic (poly)neuropathy: Secondary | ICD-10-CM | POA: Diagnosis not present

## 2014-10-13 DIAGNOSIS — I10 Essential (primary) hypertension: Secondary | ICD-10-CM | POA: Diagnosis not present

## 2014-10-13 DIAGNOSIS — F419 Anxiety disorder, unspecified: Secondary | ICD-10-CM | POA: Diagnosis not present

## 2014-10-13 DIAGNOSIS — I472 Ventricular tachycardia: Secondary | ICD-10-CM | POA: Diagnosis not present

## 2014-10-14 DIAGNOSIS — Z934 Other artificial openings of gastrointestinal tract status: Secondary | ICD-10-CM | POA: Diagnosis not present

## 2014-10-17 ENCOUNTER — Other Ambulatory Visit (HOSPITAL_BASED_OUTPATIENT_CLINIC_OR_DEPARTMENT_OTHER): Payer: Medicare Other

## 2014-10-17 ENCOUNTER — Ambulatory Visit
Admission: RE | Admit: 2014-10-17 | Discharge: 2014-10-17 | Disposition: A | Discharge status: Home / Self Care | Payer: Medicare Other | Source: Ambulatory Visit | Attending: Radiation Oncology | Admitting: Radiation Oncology

## 2014-10-17 ENCOUNTER — Ambulatory Visit (HOSPITAL_BASED_OUTPATIENT_CLINIC_OR_DEPARTMENT_OTHER): Payer: Medicare Other

## 2014-10-17 ENCOUNTER — Ambulatory Visit (HOSPITAL_BASED_OUTPATIENT_CLINIC_OR_DEPARTMENT_OTHER): Payer: Medicare Other | Admitting: Nurse Practitioner

## 2014-10-17 ENCOUNTER — Telehealth: Payer: Self-pay | Admitting: Oncology

## 2014-10-17 ENCOUNTER — Other Ambulatory Visit: Payer: Self-pay | Admitting: Oncology

## 2014-10-17 ENCOUNTER — Ambulatory Visit: Payer: Medicare Other | Admitting: Nutrition

## 2014-10-17 VITALS — BP 122/54 | HR 72 | Resp 17 | Ht 60.0 in | Wt 144.8 lb

## 2014-10-17 DIAGNOSIS — C165 Malignant neoplasm of lesser curvature of stomach, unspecified: Secondary | ICD-10-CM

## 2014-10-17 DIAGNOSIS — C169 Malignant neoplasm of stomach, unspecified: Secondary | ICD-10-CM

## 2014-10-17 DIAGNOSIS — Z5111 Encounter for antineoplastic chemotherapy: Secondary | ICD-10-CM

## 2014-10-17 DIAGNOSIS — K219 Gastro-esophageal reflux disease without esophagitis: Secondary | ICD-10-CM | POA: Diagnosis not present

## 2014-10-17 DIAGNOSIS — Z510 Encounter for antineoplastic radiation therapy: Secondary | ICD-10-CM | POA: Diagnosis not present

## 2014-10-17 DIAGNOSIS — Z452 Encounter for adjustment and management of vascular access device: Secondary | ICD-10-CM | POA: Insufficient documentation

## 2014-10-17 DIAGNOSIS — E780 Pure hypercholesterolemia: Secondary | ICD-10-CM | POA: Diagnosis not present

## 2014-10-17 DIAGNOSIS — I10 Essential (primary) hypertension: Secondary | ICD-10-CM | POA: Diagnosis not present

## 2014-10-17 DIAGNOSIS — F419 Anxiety disorder, unspecified: Secondary | ICD-10-CM | POA: Diagnosis not present

## 2014-10-17 LAB — COMPREHENSIVE METABOLIC PANEL (CC13)
ALK PHOS: 104 U/L (ref 40–150)
ALT: 18 U/L (ref 0–55)
AST: 25 U/L (ref 5–34)
Albumin: 3.3 g/dL — ABNORMAL LOW (ref 3.5–5.0)
Anion Gap: 12 mEq/L — ABNORMAL HIGH (ref 3–11)
BUN: 15.5 mg/dL (ref 7.0–26.0)
CHLORIDE: 104 meq/L (ref 98–109)
CO2: 23 mEq/L (ref 22–29)
Calcium: 9.6 mg/dL (ref 8.4–10.4)
Creatinine: 0.8 mg/dL (ref 0.6–1.1)
EGFR: 86 mL/min/{1.73_m2} — ABNORMAL LOW (ref >90)
Glucose: 197 mg/dl — ABNORMAL HIGH (ref 70–140)
Potassium: 4.1 mEq/L (ref 3.5–5.1)
Sodium: 139 mEq/L (ref 136–145)
Total Bilirubin: 0.42 mg/dL (ref 0.20–1.20)
Total Protein: 7.5 g/dL (ref 6.4–8.3)

## 2014-10-17 LAB — CBC WITH DIFFERENTIAL/PLATELET
BASO%: 0.1 % (ref 0.0–2.0)
Basophils Absolute: 0 10*3/uL (ref 0.0–0.1)
EOS ABS: 0.1 10*3/uL (ref 0.0–0.5)
EOS%: 1 % (ref 0.0–7.0)
HCT: 33.4 % — ABNORMAL LOW (ref 34.8–46.6)
HEMOGLOBIN: 10.8 g/dL — AB (ref 11.6–15.9)
LYMPH#: 1.2 10*3/uL (ref 0.9–3.3)
LYMPH%: 13.3 % — ABNORMAL LOW (ref 14.0–49.7)
MCH: 28.1 pg (ref 25.1–34.0)
MCHC: 32.3 g/dL (ref 31.5–36.0)
MCV: 86.8 fL (ref 79.5–101.0)
MONO#: 0.8 10*3/uL (ref 0.1–0.9)
MONO%: 8.6 % (ref 0.0–14.0)
NEUT#: 6.7 10*3/uL — ABNORMAL HIGH (ref 1.5–6.5)
NEUT%: 77 % — ABNORMAL HIGH (ref 38.4–76.8)
NRBC: 0 % (ref 0–0)
Platelets: 290 10*3/uL (ref 145–400)
RBC: 3.85 10*6/uL (ref 3.70–5.45)
RDW: 19.4 % — AB (ref 11.2–14.5)
WBC: 8.7 10*3/uL (ref 3.9–10.3)

## 2014-10-17 MED ORDER — FLUOROURACIL CHEMO INJECTION 5 GM/100ML
200.0000 mg/m2/d | INTRAVENOUS | Status: DC
  Administered 2014-10-17: 2350 mg via INTRAVENOUS
  Filled 2014-10-17: qty 47

## 2014-10-17 MED ORDER — LIDOCAINE HCL 1 % IJ SOLN
INTRAMUSCULAR | Status: AC
  Filled 2014-10-17: qty 20

## 2014-10-17 NOTE — Patient Instructions (Signed)
[facility] Discharge Instructions for Patients Receiving Chemotherapy  Today you received the following chemotherapy agents 5FU  To help prevent nausea and vomiting after your treatment, we encourage you to take your nausea medication as prescribed.   If you develop nausea and vomiting that is not controlled by your nausea medication, call the clinic.   BELOW ARE SYMPTOMS THAT SHOULD BE REPORTED IMMEDIATELY:  *FEVER GREATER THAN 100.5 F  *CHILLS WITH OR WITHOUT FEVER  NAUSEA AND VOMITING THAT IS NOT CONTROLLED WITH YOUR NAUSEA MEDICATION  *UNUSUAL SHORTNESS OF BREATH  *UNUSUAL BRUISING OR BLEEDING  TENDERNESS IN MOUTH AND THROAT WITH OR WITHOUT PRESENCE OF ULCERS  *URINARY PROBLEMS  *BOWEL PROBLEMS  UNUSUAL RASH Items with * indicate a potential emergency and should be followed up as soon as possible.  Feel free to call the clinic you have any questions or concerns. The clinic phone number is [phone].  Please show the [facility] at check-in to the Emergency Department and triage nurse.

## 2014-10-17 NOTE — Progress Notes (Signed)
Nutrition follow-up completed with patient and husband in infusion.  Patient is being treated for gastric cancer status post gastrectomy and jejunostomy feeding tube placement.  Patient's weight has decreased and was documented as 144.8 pounds April 4, down from 147.9 pounds March 16 and 163 pounds December 2015. Patient reports poor appetite with very little oral intake. Patient's husband states he has been using 4 cans of Glucerna 1.2 via jejunostomy feeding tube. Patient denies nutrition side effects from tube feedings. Jejunostomy feeding tube is being flushed with 120 cc of free water before and after continuous feedings.  In addition patient's husband will add additional water throughout the day. Patient is able to eat however food does not taste good to her. She will drink Gatorade and water and some milk. Today she feels tired and weak.  She has not eaten or taken insulin today.  Nutrition diagnosis: Unintended weight loss continues.  Intervention: Recommended patient increase oral intake during the day utilizing soft easy to swallow, high-protein foods. Encouraged patient to drink milk and eat ice cream since these are food she enjoys Recommended patient's spouse increase tube feeding to total of 5 cans daily and continue free water to meet minimum needs. Teach back method was used.  Estimated nutrition needs: 1800-2050 calories, 95-1 05 g protein, 2.1 L fluid.  5 cans Glucerna 1.2 provides 1425 cal, 71 g protein, 960 mL free water.  This is greater than 75% minimum estimated needs. Patient will require 7 cans Glucerna 1.2 to provide 1995 cal, 99 g protein, and 1344 mL free water.  This will meet greater than 100% of estimated needs.  Monitoring, evaluation, goals: Patient will work to increase oral intake and increase tube feedings to minimize weight loss.  Next visit: Monday, April 18, during infusion.  **Disclaimer: This note was dictated with voice recognition software.  Similar sounding words can inadvertently be transcribed and this note may contain transcription errors which may not have been corrected upon publication of note.**

## 2014-10-17 NOTE — Telephone Encounter (Signed)
Gave and printed appt sched and avs for pt for April...sed Added tx.

## 2014-10-17 NOTE — Procedures (Signed)
RUE PICC 35 cm SVC RA No comp

## 2014-10-17 NOTE — Progress Notes (Addendum)
  [name] OFFICE PROGRESS NOTE   Diagnosis:  Gastric cancer  INTERVAL HISTORY:   Tammy Hughes returns as scheduled. She completed week 4 5-FU/leucovorin 10/05/2014. She has periodic nausea/vomiting. No diarrhea. No mouth sores. No hand or foot pain or redness. Appetite continues to be poor. She is receiving tube feedings 12-13 hours a day. She denies shortness of breath. No fever. Energy level is poor.  Objective:  Vital signs in last 24 hours:  Blood pressure 122/54, pulse 72, resp. rate 17, height 5' (1.524 m), weight 144 lb 12.8 oz (65.681 kg), SpO2 97 %.    HEENT: No thrush or ulcers. Resp: Lungs with rales at the bases. Cardio: Regular rate and rhythm. GI: Abdomen soft and nontender. Feeding tube site without erythema. Vascular: No leg edema. Calves soft and nontender. Right upper extremity PICC site without erythema.   Lab Results:  Lab Results  Component Value Date   WBC 8.7 10/17/2014   HGB 10.8* 10/17/2014   HCT 33.4* 10/17/2014   MCV 86.8 10/17/2014   PLT 290 10/17/2014   NEUTROABS 6.7* 10/17/2014    Imaging:  No results found.  Medications: I have reviewed the patient's current medications.  Assessment/Plan: 1. Gastric cancer, status post an endoscopic biopsy of a lesser curvature mass on 05/31/2014 confirming adenocarcinoma  Staging CTs of the chest and abdomen on 06/08/2014 revealed no evidence of metastatic disease  Proximal gastrectomy and feeding jejunostomy 07/20/2014, pT2,pN1  Adjuvant weekly 5-Fu/leucovorin 09/14/2014, 09/21/2014, 09/28/2014 and 10/05/2014  Initiation of radiation and infusional 5-FU 10/17/2014  2. Pulmonary fibrosis  3. Esophageal stricture, status post a dilatation procedure 05/31/2014  4. Diabetes  5. Postoperative ventricle tachycardia, NSTEMI January 2016  6. Hospital-acquired pneumonia January 2016  7. CT abdomen/pelvis 09/02/2014 with postsurgical changes. Focal fluid collection in the  midline abutting the distal greater curvature of the stomach measuring 2.2 x 2.9 x 3.4 cm. Mild stranding in the adjacent fat.  8. PICC placement 10/17/2014   Disposition: [name] will begin radiation and infusional 5-FU today. Her performance status is poor. We will see her on a weekly basis for now. She and her husband understand to contact the office with any problems prior to her next visit in one week.  Patient seen with Dr. Benay Spice.    Bobbiejo, Ishikawa ANP/GNP-BC   10/17/2014  11:31 AM  This was a shared visit with Ned Card. Tammy Hughes is scheduled to begin infusional 5-fluorouracil and radiation today. We discussed the indication for proceeding with adjuvant therapy versus following her with observation. She agrees to proceed. She has a borderline performance status. She will return for an office visit next week. The feeding tube will remain in place.  Julieanne Manson, M.D.

## 2014-10-18 ENCOUNTER — Ambulatory Visit
Admission: RE | Admit: 2014-10-18 | Discharge: 2014-10-18 | Disposition: A | Discharge status: Home / Self Care | Payer: Medicare Other | Source: Ambulatory Visit | Attending: Radiation Oncology | Admitting: Radiation Oncology

## 2014-10-18 ENCOUNTER — Ambulatory Visit
Admission: RE | Admit: 2014-10-18 | Discharge: 2014-10-18 | Disposition: A | Discharge status: Home / Self Care | Payer: Medicare Other | Source: Ambulatory Visit | Attending: Interventional Radiology | Admitting: Interventional Radiology

## 2014-10-18 DIAGNOSIS — C169 Malignant neoplasm of stomach, unspecified: Secondary | ICD-10-CM | POA: Insufficient documentation

## 2014-10-18 DIAGNOSIS — Z510 Encounter for antineoplastic radiation therapy: Secondary | ICD-10-CM | POA: Diagnosis not present

## 2014-10-18 DIAGNOSIS — F419 Anxiety disorder, unspecified: Secondary | ICD-10-CM | POA: Diagnosis not present

## 2014-10-18 DIAGNOSIS — K219 Gastro-esophageal reflux disease without esophagitis: Secondary | ICD-10-CM | POA: Diagnosis not present

## 2014-10-18 DIAGNOSIS — E780 Pure hypercholesterolemia: Secondary | ICD-10-CM | POA: Diagnosis not present

## 2014-10-18 DIAGNOSIS — I10 Essential (primary) hypertension: Secondary | ICD-10-CM | POA: Diagnosis not present

## 2014-10-18 MED ORDER — RADIAPLEXRX EX GEL
Freq: Once | CUTANEOUS | Status: AC
  Administered 2014-10-18: 12:00:00 via TOPICAL

## 2014-10-18 NOTE — Progress Notes (Addendum)
Patient education, gastric cancer, gave radiaplex gel and Radiation therapy and you book, my business card, discussed with patient and daughter ways to manage side effects, nausea, fatigue, skin irritation, vomiting, diarrhea, weight loss, dehydration,  Bladder changes, increase protein in diet, may need to eat 5-6 smaller meals instead of 3 large ones, has a jejunostomy feeding tube, daughter gives feeding all night for patient, glucerna cans of nutritional supplement, and 120cc fee water, , to start using radiaplex on abdomen once skin becomes irritated or itchy daily after rad txs, stay hydrated, if having n,d, ow fiber diet discussed, and may need rx for nausea, will see MD weekly/prn,after rad txs, patient eating very little  Stated, had chemotherapy yesterday and rash on chest and back was noted, may have the need for IVF'S also,  all questions answered, teach back given 12:28 PM

## 2014-10-19 ENCOUNTER — Ambulatory Visit
Admission: RE | Admit: 2014-10-19 | Discharge: 2014-10-19 | Disposition: A | Discharge status: Home / Self Care | Payer: Medicare Other | Source: Ambulatory Visit | Attending: Radiation Oncology | Admitting: Radiation Oncology

## 2014-10-19 ENCOUNTER — Encounter: Payer: Self-pay | Admitting: Radiation Oncology

## 2014-10-19 VITALS — BP 141/65 | HR 63 | Temp 97.4°F | Resp 20 | Wt 143.4 lb

## 2014-10-19 DIAGNOSIS — E780 Pure hypercholesterolemia: Secondary | ICD-10-CM | POA: Diagnosis not present

## 2014-10-19 DIAGNOSIS — Z510 Encounter for antineoplastic radiation therapy: Secondary | ICD-10-CM | POA: Diagnosis not present

## 2014-10-19 DIAGNOSIS — F419 Anxiety disorder, unspecified: Secondary | ICD-10-CM | POA: Diagnosis not present

## 2014-10-19 DIAGNOSIS — K219 Gastro-esophageal reflux disease without esophagitis: Secondary | ICD-10-CM | POA: Diagnosis not present

## 2014-10-19 DIAGNOSIS — C169 Malignant neoplasm of stomach, unspecified: Secondary | ICD-10-CM | POA: Diagnosis not present

## 2014-10-19 DIAGNOSIS — I10 Essential (primary) hypertension: Secondary | ICD-10-CM | POA: Diagnosis not present

## 2014-10-19 NOTE — Progress Notes (Signed)
Weekly rad txs abdomen 3/28 completed, no c/o pain or nausea, or gas , takes lactulose for constipation,  Has bowel movements daily,  very weak in w/c, unsteady, appetite fair, had grits, eggs, pineapple, gatorade,  Glucerna 4 cans night via jejunostomy feeding tube there is drainage around tubing, with dressing, changed last night, free water 120cc after feedings 12:16 PM

## 2014-10-19 NOTE — Progress Notes (Signed)
Department of Radiation Oncology  Phone:  [phone] Fax:        [phone]  Weekly Treatment Note    Name: Tammy Hughes Date: 10/19/2014 MRN: [id number] DOB: Jul 08, 1933   Current dose: 5.4 Gy  Current fraction: 3   MEDICATIONS: Current Outpatient Prescriptions  Medication Sig Dispense Refill  . Calcium Carbonate-Vit D-Min (CALCIUM 600 + MINERALS) 600-200 MG-UNIT TABS Take 1 tablet by mouth daily.      . clopidogrel (PLAVIX) 75 MG tablet Take 1 tablet (75 mg total) by mouth daily. 90 tablet 1  . esomeprazole (NEXIUM) 40 MG capsule Take 1 capsule (40 mg total) by mouth daily at 12 noon. 90 capsule 2  . glucose blood (BAYER CONTOUR TEST) test strip Use to check blood sugar 2/day. Dx code E11.9 100 each 2  . hyaluronate sodium (RADIAPLEXRX) GEL Apply 1 application topically daily.    Marland Kitchen HYDROcodone-acetaminophen (HYCET) 7.5-325 mg/15 ml solution Take 15 mLs by mouth every 4 (four) hours as needed for moderate pain. 240 mL 0  . insulin aspart protamine- aspart (NOVOLOG MIX 70/30) (70-30) 100 UNIT/ML injection Inject 0.4 mLs (40 Units total) into the skin 2 (two) times daily with a meal. 90 mL 3  . Insulin Pen Needle 31G X 8 MM MISC USE AS DIRECTED WITH INSULIN TWICE DAILY Dx: 250.01 200 each prn  . lactulose (CHRONULAC) 10 GM/15ML solution Take 45 mLs (30 g total) by mouth 2 (two) times daily. 946 mL 11  . LORazepam (ATIVAN) 1 MG tablet Take 1 mg by mouth at bedtime. As needed for sleep    . metoprolol tartrate (LOPRESSOR) 25 MG tablet Take 1 tablet (25 mg total) by mouth 2 (two) times daily. 180 tablet 1  . mirtazapine (REMERON) 15 MG tablet Take 1 tablet (15 mg total) by mouth at bedtime. 30 tablet 0  . promethazine (PHENERGAN) 12.5 MG tablet Take 1 tablet (12.5 mg total) by mouth every 4 (four) hours as needed for nausea or vomiting. 50 tablet 2  . rosuvastatin (CRESTOR) 10 MG tablet Take 1 tablet (10 mg total) by mouth daily. 90 tablet 3   No current facility-administered  medications for this encounter.     ALLERGIES: Pirfenidone and Aspirin   LABORATORY DATA:  Lab Results  Component Value Date   WBC 8.7 10/17/2014   HGB 10.8* 10/17/2014   HCT 33.4* 10/17/2014   MCV 86.8 10/17/2014   PLT 290 10/17/2014   Lab Results  Component Value Date   NA 139 10/17/2014   K 4.1 10/17/2014   CL 98 08/05/2014   CO2 23 10/17/2014   Lab Results  Component Value Date   ALT 18 10/17/2014   AST 25 10/17/2014   ALKPHOS 104 10/17/2014   BILITOT 0.42 10/17/2014     NARRATIVE: Tammy Hughes was seen today for weekly treatment management. The chart was checked and the patient's films were reviewed.  Weekly rad txs abdomen 3/28 completed, no c/o pain or nausea, or gas , takes lactulose for constipation,  Has bowel movements daily,  very weak in w/c, unsteady, appetite fair, had grits, eggs, pineapple, gatorade,  Glucerna 4 cans night via jejunostomy feeding tube there is drainage around tubing, with dressing, changed last night, free water 120cc after feedings 12:25 PM  The patient complains of a headache today as her dominant complaint. Overall she is doing well.  PHYSICAL EXAMINATION: weight is 143 lb 6.4 oz (65.046 kg). Her oral temperature is 97.4 F (36.3 C). Her blood  pressure is 141/65 and her pulse is 63. Her respiration is 20.        ASSESSMENT: The patient is doing satisfactorily with treatment.  PLAN: We will continue with the patient's radiation treatment as planned.

## 2014-10-20 ENCOUNTER — Ambulatory Visit
Admission: RE | Admit: 2014-10-20 | Discharge: 2014-10-20 | Disposition: A | Discharge status: Home / Self Care | Payer: Medicare Other | Source: Ambulatory Visit | Attending: Radiation Oncology | Admitting: Radiation Oncology

## 2014-10-20 DIAGNOSIS — E780 Pure hypercholesterolemia: Secondary | ICD-10-CM | POA: Diagnosis not present

## 2014-10-20 DIAGNOSIS — I10 Essential (primary) hypertension: Secondary | ICD-10-CM | POA: Diagnosis not present

## 2014-10-20 DIAGNOSIS — Z510 Encounter for antineoplastic radiation therapy: Secondary | ICD-10-CM | POA: Diagnosis not present

## 2014-10-20 DIAGNOSIS — F329 Major depressive disorder, single episode, unspecified: Secondary | ICD-10-CM | POA: Diagnosis not present

## 2014-10-20 DIAGNOSIS — K219 Gastro-esophageal reflux disease without esophagitis: Secondary | ICD-10-CM | POA: Diagnosis not present

## 2014-10-20 DIAGNOSIS — I472 Ventricular tachycardia: Secondary | ICD-10-CM | POA: Diagnosis not present

## 2014-10-20 DIAGNOSIS — C169 Malignant neoplasm of stomach, unspecified: Secondary | ICD-10-CM | POA: Diagnosis not present

## 2014-10-20 DIAGNOSIS — E1143 Type 2 diabetes mellitus with diabetic autonomic (poly)neuropathy: Secondary | ICD-10-CM | POA: Diagnosis not present

## 2014-10-20 DIAGNOSIS — F419 Anxiety disorder, unspecified: Secondary | ICD-10-CM | POA: Diagnosis not present

## 2014-10-21 ENCOUNTER — Ambulatory Visit
Admission: RE | Admit: 2014-10-21 | Discharge: 2014-10-21 | Disposition: A | Discharge status: Home / Self Care | Payer: Medicare Other | Source: Ambulatory Visit | Attending: Radiation Oncology | Admitting: Radiation Oncology

## 2014-10-21 DIAGNOSIS — E780 Pure hypercholesterolemia: Secondary | ICD-10-CM | POA: Diagnosis not present

## 2014-10-21 DIAGNOSIS — C169 Malignant neoplasm of stomach, unspecified: Secondary | ICD-10-CM | POA: Diagnosis not present

## 2014-10-21 DIAGNOSIS — I10 Essential (primary) hypertension: Secondary | ICD-10-CM | POA: Diagnosis not present

## 2014-10-21 DIAGNOSIS — K219 Gastro-esophageal reflux disease without esophagitis: Secondary | ICD-10-CM | POA: Diagnosis not present

## 2014-10-21 DIAGNOSIS — Z510 Encounter for antineoplastic radiation therapy: Secondary | ICD-10-CM | POA: Diagnosis not present

## 2014-10-21 DIAGNOSIS — F329 Major depressive disorder, single episode, unspecified: Secondary | ICD-10-CM | POA: Diagnosis not present

## 2014-10-21 DIAGNOSIS — E1143 Type 2 diabetes mellitus with diabetic autonomic (poly)neuropathy: Secondary | ICD-10-CM | POA: Diagnosis not present

## 2014-10-21 DIAGNOSIS — I472 Ventricular tachycardia: Secondary | ICD-10-CM | POA: Diagnosis not present

## 2014-10-21 DIAGNOSIS — F419 Anxiety disorder, unspecified: Secondary | ICD-10-CM | POA: Diagnosis not present

## 2014-10-23 ENCOUNTER — Other Ambulatory Visit: Payer: Self-pay | Admitting: Oncology

## 2014-10-24 ENCOUNTER — Other Ambulatory Visit (HOSPITAL_BASED_OUTPATIENT_CLINIC_OR_DEPARTMENT_OTHER): Payer: Medicare Other

## 2014-10-24 ENCOUNTER — Ambulatory Visit (HOSPITAL_BASED_OUTPATIENT_CLINIC_OR_DEPARTMENT_OTHER): Payer: Medicare Other

## 2014-10-24 ENCOUNTER — Other Ambulatory Visit: Payer: Self-pay | Admitting: Oncology

## 2014-10-24 ENCOUNTER — Ambulatory Visit
Admission: RE | Admit: 2014-10-24 | Discharge: 2014-10-24 | Disposition: A | Discharge status: Home / Self Care | Payer: Medicare Other | Source: Ambulatory Visit | Attending: Radiation Oncology | Admitting: Radiation Oncology

## 2014-10-24 ENCOUNTER — Ambulatory Visit (HOSPITAL_BASED_OUTPATIENT_CLINIC_OR_DEPARTMENT_OTHER): Payer: Medicare Other | Admitting: Nurse Practitioner

## 2014-10-24 VITALS — BP 141/56 | HR 65 | Temp 98.1°F | Resp 18 | Ht 60.0 in | Wt 145.3 lb

## 2014-10-24 DIAGNOSIS — Z510 Encounter for antineoplastic radiation therapy: Secondary | ICD-10-CM | POA: Diagnosis not present

## 2014-10-24 DIAGNOSIS — C165 Malignant neoplasm of lesser curvature of stomach, unspecified: Secondary | ICD-10-CM

## 2014-10-24 DIAGNOSIS — Z5111 Encounter for antineoplastic chemotherapy: Secondary | ICD-10-CM | POA: Diagnosis present

## 2014-10-24 DIAGNOSIS — I472 Ventricular tachycardia: Secondary | ICD-10-CM | POA: Diagnosis not present

## 2014-10-24 DIAGNOSIS — C169 Malignant neoplasm of stomach, unspecified: Secondary | ICD-10-CM

## 2014-10-24 DIAGNOSIS — E780 Pure hypercholesterolemia: Secondary | ICD-10-CM | POA: Diagnosis not present

## 2014-10-24 DIAGNOSIS — Z452 Encounter for adjustment and management of vascular access device: Secondary | ICD-10-CM

## 2014-10-24 DIAGNOSIS — I10 Essential (primary) hypertension: Secondary | ICD-10-CM | POA: Diagnosis not present

## 2014-10-24 DIAGNOSIS — R21 Rash and other nonspecific skin eruption: Secondary | ICD-10-CM

## 2014-10-24 DIAGNOSIS — E1143 Type 2 diabetes mellitus with diabetic autonomic (poly)neuropathy: Secondary | ICD-10-CM | POA: Diagnosis not present

## 2014-10-24 DIAGNOSIS — F329 Major depressive disorder, single episode, unspecified: Secondary | ICD-10-CM | POA: Diagnosis not present

## 2014-10-24 DIAGNOSIS — K219 Gastro-esophageal reflux disease without esophagitis: Secondary | ICD-10-CM | POA: Diagnosis not present

## 2014-10-24 DIAGNOSIS — F419 Anxiety disorder, unspecified: Secondary | ICD-10-CM | POA: Diagnosis not present

## 2014-10-24 LAB — CBC WITH DIFFERENTIAL/PLATELET
BASO%: 0.3 % (ref 0.0–2.0)
BASOS ABS: 0 10*3/uL (ref 0.0–0.1)
EOS%: 3 % (ref 0.0–7.0)
Eosinophils Absolute: 0.1 10*3/uL (ref 0.0–0.5)
HCT: 35.6 % (ref 34.8–46.6)
HEMOGLOBIN: 11.6 g/dL (ref 11.6–15.9)
LYMPH#: 0.8 10*3/uL — AB (ref 0.9–3.3)
LYMPH%: 24.5 % (ref 14.0–49.7)
MCH: 28.6 pg (ref 25.1–34.0)
MCHC: 32.6 g/dL (ref 31.5–36.0)
MCV: 87.7 fL (ref 79.5–101.0)
MONO#: 0.3 10*3/uL (ref 0.1–0.9)
MONO%: 9 % (ref 0.0–14.0)
NEUT#: 2.1 10*3/uL (ref 1.5–6.5)
NEUT%: 63.2 % (ref 38.4–76.8)
Platelets: 331 10*3/uL (ref 145–400)
RBC: 4.06 10*6/uL (ref 3.70–5.45)
RDW: 19.5 % — AB (ref 11.2–14.5)
WBC: 3.4 10*3/uL — ABNORMAL LOW (ref 3.9–10.3)
nRBC: 0 % (ref 0–0)

## 2014-10-24 LAB — COMPREHENSIVE METABOLIC PANEL (CC13)
ALT: 17 U/L (ref 0–55)
AST: 27 U/L (ref 5–34)
Albumin: 3.3 g/dL — ABNORMAL LOW (ref 3.5–5.0)
Alkaline Phosphatase: 132 U/L (ref 40–150)
Anion Gap: 8 mEq/L (ref 3–11)
BUN: 12.9 mg/dL (ref 7.0–26.0)
CO2: 28 mEq/L (ref 22–29)
Calcium: 10 mg/dL (ref 8.4–10.4)
Chloride: 103 mEq/L (ref 98–109)
Creatinine: 0.8 mg/dL (ref 0.6–1.1)
EGFR: 82 mL/min/{1.73_m2} — ABNORMAL LOW (ref >90)
GLUCOSE: 213 mg/dL — AB (ref 70–140)
POTASSIUM: 4.7 meq/L (ref 3.5–5.1)
Sodium: 138 mEq/L (ref 136–145)
TOTAL PROTEIN: 7.8 g/dL (ref 6.4–8.3)
Total Bilirubin: 0.39 mg/dL (ref 0.20–1.20)

## 2014-10-24 MED ORDER — SODIUM CHLORIDE 0.9 % IV SOLN
100.0000 mg/m2/d | INTRAVENOUS | Status: DC
  Administered 2014-10-24: 1150 mg via INTRAVENOUS
  Filled 2014-10-24: qty 23

## 2014-10-24 MED ORDER — HEPARIN SOD (PORK) LOCK FLUSH 100 UNIT/ML IV SOLN
500.0000 [IU] | Freq: Once | INTRAVENOUS | Status: AC
  Administered 2014-10-24: 250 [IU] via INTRAVENOUS
  Filled 2014-10-24: qty 5

## 2014-10-24 MED ORDER — SODIUM CHLORIDE 0.9 % IV SOLN
Freq: Once | INTRAVENOUS | Status: AC
  Administered 2014-10-24: 13:00:00 via INTRAVENOUS

## 2014-10-24 MED ORDER — SODIUM CHLORIDE 0.9 % IJ SOLN
10.0000 mL | INTRAMUSCULAR | Status: DC | PRN
  Administered 2014-10-24: 10 mL via INTRAVENOUS
  Filled 2014-10-24: qty 10

## 2014-10-24 NOTE — Patient Instructions (Signed)
PICC Home Guide A peripherally inserted central catheter (PICC) is a long, thin, flexible tube that is inserted into a vein in the upper arm. It is a form of intravenous (IV) access. It is considered to be a "central" line because the tip of the PICC ends in a large vein in your chest. This large vein is called the superior vena cava (SVC). The PICC tip ends in the SVC because there is a lot of blood flow in the SVC. This allows medicines and IV fluids to be quickly distributed throughout the body. The PICC is inserted using a sterile technique by a specially trained nurse or physician. After the PICC is inserted, a chest X-ray exam is done to be sure it is in the correct place.  A PICC may be placed for different reasons, such as:  To give medicines and liquid nutrition that can only be given through a central line. Examples are:  Certain antibiotic treatments.  Chemotherapy.  Total parenteral nutrition (TPN).  To take frequent blood samples.  To give IV fluids and blood products.  If there is difficulty placing a peripheral intravenous (PIV) catheter. If taken care of properly, a PICC can remain in place for several months. A PICC can also allow a person to go home from the hospital early. Medicine and PICC care can be managed at home by a family member or home health care team. WHAT PROBLEMS CAN HAPPEN WHEN I HAVE A PICC? Problems with a PICC can occasionally occur. These may include the following:  A blood clot (thrombus) forming in or at the tip of the PICC. This can cause the PICC to become clogged. A clot-dissolving medicine called tissue plasminogen activator (tPA) can be given through the PICC to help break up the clot.  Inflammation of the vein (phlebitis) in which the PICC is placed. Signs of inflammation may include redness, pain at the insertion site, red streaks, or being able to feel a "cord" in the vein where the PICC is located.  Infection in the PICC or at the insertion  site. Signs of infection may include fever, chills, redness, swelling, or pus drainage from the PICC insertion site.  PICC movement (malposition). The PICC tip may move from its original position due to excessive physical activity, forceful coughing, sneezing, or vomiting.  A break or cut in the PICC. It is important to not use scissors near the PICC.  Nerve or tendon irritation or injury during PICC insertion. WHAT SHOULD I KEEP IN MIND ABOUT ACTIVITIES WHEN I HAVE A PICC?  You may bend your arm and move it freely. If your PICC is near or at the bend of your elbow, avoid activity with repeated motion at the elbow.  Rest at home for the remainder of the day following PICC line insertion.  Avoid lifting heavy objects as instructed by your health care provider.  Avoid using a crutch with the arm on the same side as your PICC. You may need to use a walker. WHAT SHOULD I KNOW ABOUT MY PICC DRESSING?  Keep your PICC bandage (dressing) clean and dry to prevent infection.  Ask your health care provider when you may shower. Ask your health care provider to teach you how to wrap the PICC when you do take a shower.  Change the PICC dressing as instructed by your health care provider.  Change your PICC dressing if it becomes loose or wet. WHAT SHOULD I KNOW ABOUT PICC CARE?  Check the PICC insertion site   daily for leakage, redness, swelling, or pain.  Do not take a bath, swim, or use hot tubs when you have a PICC. Cover PICC line with clear plastic wrap and tape to keep it dry while showering.  Flush the PICC as directed by your health care provider. Let your health care provider know right away if the PICC is difficult to flush or does not flush. Do not use force to flush the PICC.  Do not use a syringe that is less than 10 mL to flush the PICC.  Never pull or tug on the PICC.  Avoid blood pressure checks on the arm with the PICC.  Keep your PICC identification card with you at all  times.  Do not take the PICC out yourself. Only a trained clinical professional should remove the PICC. SEEK IMMEDIATE MEDICAL CARE IF:  Your PICC is accidentally pulled all the way out. If this happens, cover the insertion site with a bandage or gauze dressing. Do not throw the PICC away. Your health care provider will need to inspect it.  Your PICC was tugged or pulled and has partially come out. Do not  push the PICC back in.  There is any type of drainage, redness, or swelling where the PICC enters the skin.  You cannot flush the PICC, it is difficult to flush, or the PICC leaks around the insertion site when it is flushed.  You hear a "flushing" sound when the PICC is flushed.  You have pain, discomfort, or numbness in your arm, shoulder, or jaw on the same side as the PICC.  You feel your heart "racing" or skipping beats.  You notice a hole or tear in the PICC.  You develop chills or a fever. MAKE SURE YOU:   Understand these instructions.  Will watch your condition.  Will get help right away if you are not doing well or get worse. Document Released: 01/05/2003 Document Revised: 11/15/2013 Document Reviewed: 03/08/2013 [employer] Patient Information 2015 [employer]. This information is not intended to replace advice given to you by your health care provider. Make sure you discuss any questions you have with your health care provider.  

## 2014-10-24 NOTE — Progress Notes (Signed)
All labs drawn by Rachel Moulds, Phlebotomist today.

## 2014-10-24 NOTE — Progress Notes (Signed)
Charted in error.

## 2014-10-24 NOTE — Progress Notes (Addendum)
[place] OFFICE PROGRESS NOTE   Diagnosis:  Gastric cancer  INTERVAL HISTORY:   Tammy Hughes returns as scheduled. She began concurrent radiation/infusional 5-fluorouracil on 10/17/2014. She denies nausea/vomiting. No mouth sores. She notes that her lips are chapped. No diarrhea. She notes increased rash around the neck and on her arms. Appetite continues to be poor. She receivestube feedings 12-13 hours a day. She developed a callus at the left heel. Her daughter removed the overlying skin and used a "stone" to remove skin on both feet. She now notes a blister filled with blood and marked tenderness.  Objective:  Vital signs in last 24 hours:  Blood pressure 141/56, pulse 65, temperature 98.1 F (36.7 C), temperature source Oral, resp. rate 18, height 5' (1.524 m), weight 145 lb 4.8 oz (65.908 kg), SpO2 99 %.    HEENT: No thrush or ulcers. Resp: Lungs with rales at the bases. Cardio: Regular rate and rhythm. GI: Abdomen soft and nontender. Feeding tube site without erythema. Vascular: No leg edema.  Skin: Erythematous lesions scattered around the neck, upper back and chest, forearms. Patchy erythema over the scalp. Soles are dry appearing. Large, question blood filled, blister at the left medial heel with surrounding erythema and marked associated tenderness. Both large toes with erythema, associated tenderness, medially. No significant erythema over the palms. PICC site with an ecchymosis superiorly.   Lab Results:  Lab Results  Component Value Date   WBC 3.4* 10/24/2014   HGB 11.6 10/24/2014   HCT 35.6 10/24/2014   MCV 87.7 10/24/2014   PLT 331 10/24/2014   NEUTROABS 2.1 10/24/2014    Imaging:  No results found.  Medications: I have reviewed the patient's current medications.  Assessment/Plan: 1. Gastric cancer, status post an endoscopic biopsy of a lesser curvature mass on 05/31/2014 confirming adenocarcinoma  Staging CTs of the chest and abdomen  on 06/08/2014 revealed no evidence of metastatic disease  Proximal gastrectomy and feeding jejunostomy 07/20/2014, pT2,pN1  Adjuvant weekly 5-Fu/leucovorin 09/14/2014, 09/21/2014, 09/28/2014 and 10/05/2014  Initiation of radiation and infusional 5-FU 10/17/2014  2. Pulmonary fibrosis 3. Esophageal stricture, status post a dilatation procedure 05/31/2014 4. Diabetes 5. Postoperative ventricle tachycardia, NSTEMI January 2016 6. Hospital-acquired pneumonia January 2016 7. CT abdomen/pelvis 09/02/2014 with postsurgical changes. Focal fluid collection in the midline abutting the distal greater curvature of the stomach measuring 2.2 x 2.9 x 3.4 cm. Mild stranding in the adjacent fat. 8. PICC placement 10/17/2014 9. Rash. Most likely related to the 5-fluorouracil.   Disposition: Ms. Stohr appears stable. She continues radiation and infusional 5-fluorouracil. The rash is most likely related to the 5-FU. The 5-FU will be dose reduced beginning today. She and her husband understand to contact the office if the rash worsens.  The erythema and tenderness over the feet is likely a result of skin removal with a "stone". We cautioned her against further use of this. We discussed applying lotion 2-3 times a day. She will contact the office with increased pain or spreading redness.  She will return for a follow-up visit in one week. She will contact the office in the interim as outlined above or with any other problems.  Patient seen with Dr. Benay Spice.     Alira, Fretwell ANP/GNP-BC   10/24/2014  11:30 AM  This was a shared visit with Ned Card. Ms. Skorupski was interviewed and examined. She has developed a 5-fluorouracil skin rash. We dose reduced the 5 fluorouracil. She will contact us if the rash progresses. Ms. Cocke  will return for an office visit in one week.  Julieanne Manson, M.D.

## 2014-10-24 NOTE — Patient Instructions (Signed)
[facility] Discharge Instructions for Patients Receiving Chemotherapy  Today you received the following chemotherapy agent: Adrucil   To help prevent nausea and vomiting after your treatment, we encourage you to take your nausea medication as prescribed.    If you develop nausea and vomiting that is not controlled by your nausea medication, call the clinic.   BELOW ARE SYMPTOMS THAT SHOULD BE REPORTED IMMEDIATELY:  *FEVER GREATER THAN 100.5 F  *CHILLS WITH OR WITHOUT FEVER  NAUSEA AND VOMITING THAT IS NOT CONTROLLED WITH YOUR NAUSEA MEDICATION  *UNUSUAL SHORTNESS OF BREATH  *UNUSUAL BRUISING OR BLEEDING  TENDERNESS IN MOUTH AND THROAT WITH OR WITHOUT PRESENCE OF ULCERS  *URINARY PROBLEMS  *BOWEL PROBLEMS  UNUSUAL RASH Items with * indicate a potential emergency and should be followed up as soon as possible.  Feel free to call the clinic you have any questions or concerns. The clinic phone number is [phone].  Please show the [facility] at check-in to the Emergency Department and triage nurse.

## 2014-10-25 ENCOUNTER — Ambulatory Visit (HOSPITAL_COMMUNITY)
Admission: RE | Admit: 2014-10-25 | Discharge: 2014-10-25 | Disposition: A | Discharge status: Home / Self Care | Payer: Medicare Other | Source: Ambulatory Visit | Attending: Nurse Practitioner | Admitting: Nurse Practitioner

## 2014-10-25 ENCOUNTER — Ambulatory Visit
Admission: RE | Admit: 2014-10-25 | Discharge: 2014-10-25 | Disposition: A | Discharge status: Home / Self Care | Payer: Medicare Other | Source: Ambulatory Visit | Attending: Radiation Oncology | Admitting: Radiation Oncology

## 2014-10-25 ENCOUNTER — Encounter: Payer: Self-pay | Admitting: Endocrinology

## 2014-10-25 ENCOUNTER — Telehealth: Payer: Self-pay | Admitting: Nurse Practitioner

## 2014-10-25 ENCOUNTER — Ambulatory Visit (INDEPENDENT_AMBULATORY_CARE_PROVIDER_SITE_OTHER): Payer: Medicare Other | Admitting: Endocrinology

## 2014-10-25 ENCOUNTER — Encounter: Payer: Self-pay | Admitting: Nurse Practitioner

## 2014-10-25 ENCOUNTER — Ambulatory Visit (HOSPITAL_BASED_OUTPATIENT_CLINIC_OR_DEPARTMENT_OTHER): Payer: Medicare Other | Admitting: Nurse Practitioner

## 2014-10-25 ENCOUNTER — Telehealth: Payer: Self-pay | Admitting: Internal Medicine

## 2014-10-25 ENCOUNTER — Telehealth: Payer: Self-pay | Admitting: Endocrinology

## 2014-10-25 VITALS — BP 139/61 | HR 68 | Temp 97.7°F | Resp 18 | Wt 146.9 lb

## 2014-10-25 VITALS — BP 124/62 | HR 73 | Temp 97.7°F | Ht 60.0 in | Wt 146.0 lb

## 2014-10-25 DIAGNOSIS — E780 Pure hypercholesterolemia: Secondary | ICD-10-CM | POA: Diagnosis not present

## 2014-10-25 DIAGNOSIS — R938 Abnormal findings on diagnostic imaging of other specified body structures: Secondary | ICD-10-CM | POA: Insufficient documentation

## 2014-10-25 DIAGNOSIS — L539 Erythematous condition, unspecified: Secondary | ICD-10-CM | POA: Diagnosis not present

## 2014-10-25 DIAGNOSIS — C169 Malignant neoplasm of stomach, unspecified: Secondary | ICD-10-CM

## 2014-10-25 DIAGNOSIS — C165 Malignant neoplasm of lesser curvature of stomach, unspecified: Secondary | ICD-10-CM

## 2014-10-25 DIAGNOSIS — Z7901 Long term (current) use of anticoagulants: Secondary | ICD-10-CM | POA: Diagnosis not present

## 2014-10-25 DIAGNOSIS — G893 Neoplasm related pain (acute) (chronic): Secondary | ICD-10-CM | POA: Diagnosis not present

## 2014-10-25 DIAGNOSIS — E1142 Type 2 diabetes mellitus with diabetic polyneuropathy: Secondary | ICD-10-CM

## 2014-10-25 DIAGNOSIS — F419 Anxiety disorder, unspecified: Secondary | ICD-10-CM | POA: Diagnosis not present

## 2014-10-25 DIAGNOSIS — R600 Localized edema: Secondary | ICD-10-CM

## 2014-10-25 DIAGNOSIS — I10 Essential (primary) hypertension: Secondary | ICD-10-CM | POA: Diagnosis not present

## 2014-10-25 DIAGNOSIS — E1140 Type 2 diabetes mellitus with diabetic neuropathy, unspecified: Secondary | ICD-10-CM | POA: Diagnosis not present

## 2014-10-25 DIAGNOSIS — Z510 Encounter for antineoplastic radiation therapy: Secondary | ICD-10-CM | POA: Diagnosis not present

## 2014-10-25 DIAGNOSIS — K219 Gastro-esophageal reflux disease without esophagitis: Secondary | ICD-10-CM | POA: Diagnosis not present

## 2014-10-25 DIAGNOSIS — S90829A Blister (nonthermal), unspecified foot, initial encounter: Secondary | ICD-10-CM | POA: Insufficient documentation

## 2014-10-25 DIAGNOSIS — S90821D Blister (nonthermal), right foot, subsequent encounter: Secondary | ICD-10-CM

## 2014-10-25 DIAGNOSIS — R21 Rash and other nonspecific skin eruption: Secondary | ICD-10-CM

## 2014-10-25 MED ORDER — RIVAROXABAN (XARELTO) VTE STARTER PACK (15 & 20 MG): ORAL_TABLET | ORAL | Status: DC

## 2014-10-25 MED ORDER — HYDROCODONE-ACETAMINOPHEN 7.5-325 MG/15ML PO SOLN: 15.0000 mL | ORAL | Status: DC | PRN

## 2014-10-25 MED ORDER — INSULIN ASPART PROT & ASPART (70-30 MIX) 100 UNIT/ML PEN: PEN_INJECTOR | SUBCUTANEOUS | Status: DC

## 2014-10-25 NOTE — Telephone Encounter (Signed)
She was placed on plavix per neurology for TIA/stroke. Not for CAD. I would advise she stop Plavix if she is going on xarelto for her upper extremity DVT.  Dr. Lemmie Evens

## 2014-10-25 NOTE — Assessment & Plan Note (Signed)
Patient initiated cycle 2 of her infusional 5-FU pump yesterday 10/24/2014.  The dose was reduced due to chronic chemotherapy-induced rash.  Patient also continues with concurrent radiation therapy.  Her final radiation treatment is scheduled for 11/23/2014.  Patient is scheduled to return on 10/31/2014 for her next cycle of chemotherapy.

## 2014-10-25 NOTE — Progress Notes (Signed)
SYMPTOM MANAGEMENT CLINIC   HPI: Tammy Hughes 81 y.o. female diagnosed with gastric cancer.  Currently undergoing infusional 5-FU pump and concurrent radiation therapy.  Patient initiated cycle 2 of her chemotherapy just yesterday 10/24/2014.  Her chemotherapy was dose reduced due to chronic rash.  Patient is complaining of new onset right upper extremity pain that is radiating to her right shoulder.  She denies any chest pain, chest pressure, shortness breath, or pain with inspiration.  She denies any recent fevers or chills.  She had a new PICC line placed to her right upper extremity on 10/17/2014.  Also, patients daughter used a pumice stone to patient's feet recently; and patient now has some healing blisters to her bilateral great toe; as well as a large blood blister to her right foot.  Patient has a history of known diabetes; and states that she continues to take her diabetic medications as prescribed.  Also, patient has a PEG tube; and continues with tube feeds.  Patient has plans to meet with her primary care provider later this afternoon; and plans to ask for pain medication refill at that time.   HPI  ROS  Past Medical History  Diagnosis Date  . HYPERCHOLESTEROLEMIA 02/01/2008  . ANXIETY 03/08/2008  . PERIPHERAL NEUROPATHY 02/23/2007  . HYPERTENSION 03/08/2008  . GERD 02/23/2007  . DIVERTICULOSIS, COLON 03/08/2008  . OSTEOARTHRITIS 02/23/2007  . OSTEOPOROSIS 02/23/2007  . Urolithiasis   . Allergy     SEASONAL  . Cataract     REMOVED BILATERAL  . Depression   . Stomach cancer 07/20/14    invasive adenocarcinoma w/signet rings features  . DIABETES MELLITUS, TYPE I 02/23/2007    Past Surgical History  Procedure Laterality Date  . Cholecystectomy  1995  . Esophagogastroduodenoscopy  06/08/2002  . Gastrectomy N/A 07/20/2014    Procedure: PROXIMAL GASTRECTOMY;  Surgeon: Stark Klein, MD;  Location: [facility];  Service: General;  Laterality: N/A;  . Laparoscopy N/A 07/20/2014     Procedure: LAPAROSCOPY DIAGNOSTIC;  Surgeon: Stark Klein, MD;  Location: [facility];  Service: General;  Laterality: N/A;  . Gastrojejunostomy N/A 07/20/2014    Procedure: GASTROJEJUNOSTOMY;  Surgeon: Stark Klein, MD;  Location: [facility];  Service: General;  Laterality: N/A;    has TRICHOMONAL URETHRITIS; DIABETES MELLITUS, TYPE I; HYPERCHOLESTEROLEMIA; LEUKOPENIA, MILD; ANXIETY; PERIPHERAL NEUROPATHY; Essential hypertension; Non-allergic rhinitis; GERD; DIVERTICULOSIS, COLON; RECTAL BLEEDING; OSTEOARTHRITIS; BACK PAIN, LUMBAR; OSTEOPOROSIS; Encounter for long-term (current) use of other medications; Carpal tunnel syndrome of left wrist; NASH (nonalcoholic steatohepatitis); Knee pain, bilateral; Cough; Postinflammatory pulmonary fibrosis; Pain in joint, lower leg; Dysphagia; Gastric cancer-s/p gastrectomy 07/20/14; NSVT post-op; Elevated troponin post op - 0.6; Aspirin allergy; Type 2 diabetes mellitus; Debility; HCAP (healthcare-associated pneumonia); Type 2 diabetes mellitus with peripheral neuropathy; Abdominal pain; Cancer associated pain; Rash; Long term current use of anticoagulant; Leg edema, right; and Blister of foot on her problem list.    is allergic to pirfenidone and aspirin.    Medication List       This list is accurate as of: 10/25/14  5:45 PM.  Always use your most recent med list.               CALCIUM 600 + MINERALS 600-200 MG-UNIT Tabs  Take 1 tablet by mouth daily.     clopidogrel 75 MG tablet  Commonly known as:  PLAVIX  Take 1 tablet (75 mg total) by mouth daily.     esomeprazole 40 MG capsule  Commonly known as:  NEXIUM  Take 1 capsule (40 mg total) by mouth daily at 12 noon.     glucose blood test strip  Commonly known as:  BAYER CONTOUR TEST  Use to check blood sugar 2/day. Dx code E11.9     hyaluronate sodium Gel  Apply 1 application topically daily.     HYDROcodone-acetaminophen 7.5-325 mg/15 ml solution  Commonly known as:  HYCET  Take 15 mLs by mouth  every 4 (four) hours as needed for moderate pain.     insulin aspart protamine - aspart (70-30) 100 UNIT/ML FlexPen  Commonly known as:  NOVOLOG MIX 70/30 FLEXPEN  35 units each morning and 55 units at bedtime.     Insulin Pen Needle 31G X 8 MM Misc  USE AS DIRECTED WITH INSULIN TWICE DAILY Dx: 250.01     lactulose 10 GM/15ML solution  Commonly known as:  CHRONULAC  Take 45 mLs (30 g total) by mouth 2 (two) times daily.     LORazepam 1 MG tablet  Commonly known as:  ATIVAN  Take 1 mg by mouth at bedtime. As needed for sleep     metoprolol tartrate 25 MG tablet  Commonly known as:  LOPRESSOR  Take 1 tablet (25 mg total) by mouth 2 (two) times daily.     mirtazapine 15 MG tablet  Commonly known as:  REMERON  Take 1 tablet (15 mg total) by mouth at bedtime.     promethazine 12.5 MG tablet  Commonly known as:  PHENERGAN  Take 1 tablet (12.5 mg total) by mouth every 4 (four) hours as needed for nausea or vomiting.     Rivaroxaban 15 & 20 MG Tbpk  Commonly known as:  XARELTO STARTER PACK  Take as directed on package: Start with one 28m tablet PO twice daily with food. On Day 22, switch to one 236mtablet once daily with food.     rosuvastatin 10 MG tablet  Commonly known as:  CRESTOR  Take 1 tablet (10 mg total) by mouth daily.         PHYSICAL EXAMINATION  Oncology Vitals 10/25/2014 10/25/2014 10/24/2014 10/19/2014 10/17/2014 10/05/2014 09/28/2014  Height 152 cm - 152 cm - 152 cm - -  Weight 66.225 kg 66.633 kg 65.908 kg 65.046 kg 65.681 kg - 67.132 kg  Weight (lbs) 146 lbs 146 lbs 14 oz 145 lbs 5 oz 143 lbs 6 oz 144 lbs 13 oz - 148 lbs  BMI (kg/m2) 28.51 kg/m2 - 28.38 kg/m2 - 28.28 kg/m2 - -  Temp 97.7 97.7 98.1 97.4 - - 98.2  Pulse 73 68 65 63 72 65 68  Resp - '18 18 20 17 17 ' -  SpO2 96 98 99 - 97 100 94  BSA (m2) 1.67 m2 - 1.67 m2 - 1.67 m2 - -   BP Readings from Last 3 Encounters:  10/25/14 139/61  10/25/14 124/62  10/24/14 141/56    Physical Exam  Constitutional:  She is oriented to person, place, and time. Vital signs are normal. She appears unhealthy.  Patient appears fatigued, weak, and chronically ill.  HENT:  Head: Normocephalic and atraumatic.  Mouth/Throat: Oropharynx is clear and moist.  Eyes: Conjunctivae and EOM are normal. Pupils are equal, round, and reactive to light. Right eye exhibits no discharge. Left eye exhibits no discharge. No scleral icterus.  Neck: Normal range of motion. Neck supple. No JVD present. No tracheal deviation present. No thyromegaly present.  Cardiovascular: Normal rate, regular rhythm, normal heart sounds and intact distal pulses.  Pulmonary/Chest: Effort normal and breath sounds normal. No respiratory distress. She has no wheezes. She has no rales. She exhibits no tenderness.  Abdominal: Soft. Bowel sounds are normal. She exhibits no distension and no mass. There is no tenderness. There is no rebound and no guarding.  Left abdominal wall with PEG tube intact.  Musculoskeletal: Normal range of motion. She exhibits edema and tenderness.  Right lower extremity with edema; but no calf tenderness, no erythema, no warmth, and no red streaks.  Right upper arm with PICC intact with healing bruise.  Mild tenderness to right upper arm above elbow.  No evidence of edema, erythema, warmth, or red streaks.  Lymphadenopathy:    She has no cervical adenopathy.  Neurological: She is alert and oriented to person, place, and time.  Patient in a wheelchair during the exam.  Skin: Skin is warm and dry. Rash noted. No erythema. No pallor.  Patient with scattered pink/red rash/lesion to her scalp, face, neck, anterior chest wall, upper back, and bilateral arms.  No evidence of infection to rash.  Psychiatric: Affect normal.  Nursing note and vitals reviewed.   LABORATORY DATA:. Appointment on 10/24/2014  Component Date Value Ref Range Status  . WBC 10/24/2014 3.4* 3.9 - 10.3 10e3/uL Final  . NEUT# 10/24/2014 2.1  1.5 - 6.5 10e3/uL  Final  . HGB 10/24/2014 11.6  11.6 - 15.9 g/dL Final  . HCT 10/24/2014 35.6  34.8 - 46.6 % Final  . Platelets 10/24/2014 331  145 - 400 10e3/uL Final  . MCV 10/24/2014 87.7  79.5 - 101.0 fL Final  . MCH 10/24/2014 28.6  25.1 - 34.0 pg Final  . MCHC 10/24/2014 32.6  31.5 - 36.0 g/dL Final  . RBC 10/24/2014 4.06  3.70 - 5.45 10e6/uL Final  . RDW 10/24/2014 19.5* 11.2 - 14.5 % Final  . lymph# 10/24/2014 0.8* 0.9 - 3.3 10e3/uL Final  . MONO# 10/24/2014 0.3  0.1 - 0.9 10e3/uL Final  . Eosinophils Absolute 10/24/2014 0.1  0.0 - 0.5 10e3/uL Final  . Basophils Absolute 10/24/2014 0.0  0.0 - 0.1 10e3/uL Final  . NEUT% 10/24/2014 63.2  38.4 - 76.8 % Final  . LYMPH% 10/24/2014 24.5  14.0 - 49.7 % Final  . MONO% 10/24/2014 9.0  0.0 - 14.0 % Final  . EOS% 10/24/2014 3.0  0.0 - 7.0 % Final  . BASO% 10/24/2014 0.3  0.0 - 2.0 % Final  . nRBC 10/24/2014 0  0 - 0 % Final  . Sodium 10/24/2014 138  136 - 145 mEq/L Final  . Potassium 10/24/2014 4.7  3.5 - 5.1 mEq/L Final  . Chloride 10/24/2014 103  98 - 109 mEq/L Final  . CO2 10/24/2014 28  22 - 29 mEq/L Final  . Glucose 10/24/2014 213* 70 - 140 mg/dl Final  . BUN 10/24/2014 12.9  7.0 - 26.0 mg/dL Final  . Creatinine 10/24/2014 0.8  0.6 - 1.1 mg/dL Final  . Total Bilirubin 10/24/2014 0.39  0.20 - 1.20 mg/dL Final  . Alkaline Phosphatase 10/24/2014 132  40 - 150 U/L Final  . AST 10/24/2014 27  5 - 34 U/L Final  . ALT 10/24/2014 17  0 - 55 U/L Final  . Total Protein 10/24/2014 7.8  6.4 - 8.3 g/dL Final  . Albumin 10/24/2014 3.3* 3.5 - 5.0 g/dL Final  . Calcium 10/24/2014 10.0  8.4 - 10.4 mg/dL Final  . Anion Gap 10/24/2014 8  3 - 11 mEq/L Final  . EGFR 10/24/2014 82* >90 ml/min/1.73  m2 Final   eGFR is calculated using the CKD-EPI Creatinine Equation (2009)   Doppler US: Summary:  - No evidence of deep vein or superficial thrombosis involving the right lower extremity and left common femoral vein. - Findings consistent with acute deep vein  thrombosis involving the right axillary vein. - No evidence of Baker&'s cyst on the right.   RADIOGRAPHIC STUDIES: No results found.  ASSESSMENT/PLAN:    Gastric cancer-s/p gastrectomy 07/20/14 Patient initiated cycle 2 of her infusional 5-FU pump yesterday 10/24/2014.  The dose was reduced due to chronic chemotherapy-induced rash.  Patient also continues with concurrent radiation therapy.  Her final radiation treatment is scheduled for 11/23/2014.  Patient is scheduled to return on 10/31/2014 for her next cycle of chemotherapy.   Cancer associated pain Patient complains of some generalized chronic pain; is also complaining of increased pain to her right upper extremity.  Patient was diagnosed with a right upper extremity DVT earlier today.  Patient left the cancer Center this afternoon; and went to her previously arranged primary care provider appointment.  Patient was able to obtain her hydrocodone 7.5/325 mg elixir refill prescription from her primary care provider.   Rash Patient has a chronic chemotherapy therapy-induced rash to her face and scalp; as well as both her anterior chest wall and upper back.  She also has some rash to her bilateral forearms as well.  Patient does complain of some intermittent pruritus to the rash.  Chemotherapy initiated just yesterday 10/24/2014 was dose reduced due to the rash.  On exam-patient with pink/red scattered rash/lesions to scalp/face, anterior chest wall, upper back, and bilateral forearms.  Does appear patient has been scratching; as some of the lesions have scabbed.  No evidence of active infection.    Hopefully, the chemotherapy dose reduction will help resolve the rash.  Patient was advised to call/return if her rash does not improve.   Long term current use of anticoagulant Patient had a right upper arm PICC line inserted on 10/17/2014.  She began complaining last night of right upper extremity pain that radiates to her right shoulder  area.   On exam- no evidence of erythema, edema, warmth, or red streaks to the right arm.  There is some mild tenderness to the right upper posterior arm.  Patient has full range of motion with her right arm.  Doppler ultrasound obtained today did reveal a DVT to the right axilla region.  Results of ultrasound were called to patient's daughter-who is a Equities trader.  Advised daughter would prescribe Xarelto starter pack for patient for treatment of the DVT.  Patient takes chronic Plavix per her cardiologist Dr. Debara Pickett.  Have placed a call with Dr. Lysbeth Penner office regarding the continuation of the Plavix.  As soon as we care back from Dr. Lysbeth Penner office-will notify patient of plan for Plavix continuation.   Leg edema, right Patient noted to have some right lower extremity edema.  Patient with no calf tenderness.  Also, no evidence of erythema, warmth, or red streaks.  Doppler ultrasound obtained of the right lower extremity was negative for DVT.  Patient was advised to elevate her legs above the level of for heart when at rest if at all possible.   Blister of foot Patient's daughter used an apparent pumice stone to patient's bilateral feet recently.  Patient has 2 healing blistered areas to her bilateral great toes.  She also has a large (approx 3 cm in diameter) blood blister to the side of her right foot  as well.  This area is somewhat tender; but does not appear infected at this time.  Patient was afebrile while at the cancer center.  Patient was advised to discontinue all use of the pumice stone in the future.  She was encouraged to use a moisturizing agent to her feet; and to keep close watch of these blistered areas to her feet.  She has been advised to call/return or go directly to the emergency department for any worsening of any of these areas to her feet.   Patient stated understanding of all instructions; and was in agreement with this plan of care. The patient knows to call the  clinic with any problems, questions or concerns.   Review/collaboration with Dr. Benay Spice regarding all aspects of patient's visit today.   Total time spent with patient was 40 minutes;  with greater than 75 percent of that time spent in face to face counseling regarding patient's symptoms,  and coordination of care and follow up.  Disclaimer: This note was dictated with voice recognition software. Similar sounding words can inadvertently be transcribed and may not be corrected upon review.   Drue Second, NP 10/25/2014

## 2014-10-25 NOTE — Assessment & Plan Note (Signed)
Patient noted to have some right lower extremity edema.  Patient with no calf tenderness.  Also, no evidence of erythema, warmth, or red streaks.  Doppler ultrasound obtained of the right lower extremity was negative for DVT.  Patient was advised to elevate her legs above the level of for heart when at rest if at all possible.

## 2014-10-25 NOTE — Telephone Encounter (Signed)
POF sent to scheduler 

## 2014-10-25 NOTE — Progress Notes (Signed)
Subjective:    Patient ID: Tammy Hughes, female    DOB: January 16, 1933, 81 y.o.   MRN: [id number]  HPI The state of at least three ongoing medical problems is addressed today, with interval history of each noted here: Pt returns for f/u of diabetes mellitus: DM type: Insulin-requiring type 2 Dx'ed: 5176 Complications: polyneuropathy.   Therapy: insulin since 2005 GDM: never DKA: never Severe hypoglycemia: never Pancreatitis: never Other: she chose bid premixed insulin.   Interval history: hx is from pt and husband, who says chemo does not seem to affect cbg's.  no cbg record, but states cbg's vary from 141-200's.  It is lowest in the afternoon, and highest in am.  She takes tube-feeding overnight.  she eats very little during the day.  She receives chemotx and XRT.  Nausea is less recently.  phenergan helps. abd pain: pain is worse x the past week.  Past Medical History  Diagnosis Date  . HYPERCHOLESTEROLEMIA 02/01/2008  . ANXIETY 03/08/2008  . PERIPHERAL NEUROPATHY 02/23/2007  . HYPERTENSION 03/08/2008  . GERD 02/23/2007  . DIVERTICULOSIS, COLON 03/08/2008  . OSTEOARTHRITIS 02/23/2007  . OSTEOPOROSIS 02/23/2007  . Urolithiasis   . Allergy     SEASONAL  . Cataract     REMOVED BILATERAL  . Depression   . Stomach cancer 07/20/14    invasive adenocarcinoma w/signet rings features  . DIABETES MELLITUS, TYPE I 02/23/2007  . DVT (deep venous thrombosis)     Past Surgical History  Procedure Laterality Date  . Cholecystectomy  1995  . Esophagogastroduodenoscopy  06/08/2002  . Gastrectomy N/A 07/20/2014    Procedure: PROXIMAL GASTRECTOMY;  Surgeon: Stark Klein, MD;  Location: [facility];  Service: General;  Laterality: N/A;  . Laparoscopy N/A 07/20/2014    Procedure: LAPAROSCOPY DIAGNOSTIC;  Surgeon: Stark Klein, MD;  Location: [facility];  Service: General;  Laterality: N/A;  . Gastrojejunostomy N/A 07/20/2014    Procedure: GASTROJEJUNOSTOMY;  Surgeon: Stark Klein, MD;  Location: [facility];  Service:  General;  Laterality: N/A;    History   Social History  . Marital Status: Married    Spouse Name: N/A  . Number of Children: N/A  . Years of Education: N/A   Occupational History  . Retired    Social History Main Topics  . Smoking status: Never Smoker   . Smokeless tobacco: Never Used  . Alcohol Use: No  . Drug Use: No  . Sexual Activity: Not on file   Other Topics Concern  . Not on file   Social History Narrative   Dentist          No current facility-administered medications on file prior to visit.   Current Outpatient Prescriptions on File Prior to Visit  Medication Sig Dispense Refill  . clopidogrel (PLAVIX) 75 MG tablet Take 1 tablet (75 mg total) by mouth daily. 90 tablet 1  . esomeprazole (NEXIUM) 40 MG capsule Take 1 capsule (40 mg total) by mouth daily at 12 noon. 90 capsule 2  . glucose blood (BAYER CONTOUR TEST) test strip Use to check blood sugar 2/day. Dx code E11.9 100 each 2  . hyaluronate sodium (RADIAPLEXRX) GEL Apply 1 application topically daily.    Marland Kitchen HYDROcodone-acetaminophen (HYCET) 7.5-325 mg/15 ml solution Take 15 mLs by mouth every 4 (four) hours as needed for moderate pain. 473 mL 0  . Insulin Pen Needle 31G X 8 MM MISC USE AS DIRECTED WITH INSULIN TWICE DAILY Dx: 250.01 200 each prn  . LORazepam (ATIVAN)  1 MG tablet Take 1 mg by mouth at bedtime. As needed for sleep    . metoprolol tartrate (LOPRESSOR) 25 MG tablet Take 1 tablet (25 mg total) by mouth 2 (two) times daily. 180 tablet 1  . mirtazapine (REMERON) 15 MG tablet Take 1 tablet (15 mg total) by mouth at bedtime. 30 tablet 0  . promethazine (PHENERGAN) 12.5 MG tablet Take 1 tablet (12.5 mg total) by mouth every 4 (four) hours as needed for nausea or vomiting. 50 tablet 2  . rosuvastatin (CRESTOR) 10 MG tablet Take 1 tablet (10 mg total) by mouth daily. 90 tablet 3    Allergies  Allergen Reactions  . Pirfenidone Diarrhea and Nausea And Vomiting  . Aspirin     rash      Family History  Problem Relation Age of Onset  . Cancer Father     Prostate Cancer  . Cancer Sister     Breast Cancer  . Diabetes Sister   . Diabetes Sister     BP 124/62 mmHg  Pulse 73  Temp(Src) 97.7 F (36.5 C) (Oral)  Ht 5' (1.524 m)  Wt 146 lb (66.225 kg)  BMI 28.51 kg/m2  SpO2 96%  Review of Systems She has lost 2 lbs over the past month.  She denies hypoglycemia.     Objective:   Physical Exam VITAL SIGNS:  See vs page GENERAL: no distress Pulses: dorsalis pedis intact bilat.   MSK: no deformity of the feet CV: no leg edema Skin:  no ulcer on the feet.  normal color and temp on the feet, except 5x7 cm ecchymosis at the right heel, and slight erythema at both great toe paronychial areas (husb says this is due to dtr giving pt a pedicure). Neuro: sensation is intact to touch on the feet.      Assessment & Plan:  abd pain: persistent.  DM: lower glucose may stimulate appetite.  Weight loss, mild.  We'll follow.  Ecchymoses, new, due to pedicure.    Patient is advised the following: Patient Instructions  Please change the insulin to 35 units each morning and 55 units at bedtime. check your blood sugar twice a day.  vary the time of day when you check, between before the 3 meals, and at bedtime.  also check if you have symptoms of your blood sugar being too high or too low.  please keep a record of the readings and bring it to your next appointment here.  You can write it on any piece of paper.  please call us sooner if your blood sugar goes below 70, or if you have a lot of readings over 200.   Please come back for a follow-up appointment in 6 weeks.  Here is a refill of your pain medication. Please carefully watch the discolored areas on both feet.  Call us if it is worse.   i have requested for you the a1c blood test.  Please have it drawn with your next blood test at the cancer center.

## 2014-10-25 NOTE — Progress Notes (Signed)
Ok to discontinue plavix - this was started by neurology for TIA/Stroke. Agree with xarelto monotherapy for UE DVT.   Dr. Debara Pickett

## 2014-10-25 NOTE — Assessment & Plan Note (Signed)
Patient has a chronic chemotherapy therapy-induced rash to her face and scalp; as well as both her anterior chest wall and upper back.  She also has some rash to her bilateral forearms as well.  Patient does complain of some intermittent pruritus to the rash.  Chemotherapy initiated just yesterday 10/24/2014 was dose reduced due to the rash.  On exam-patient with pink/red scattered rash/lesions to scalp/face, anterior chest wall, upper back, and bilateral forearms.  Does appear patient has been scratching; as some of the lesions have scabbed.  No evidence of active infection.    Hopefully, the chemotherapy dose reduction will help resolve the rash.  Patient was advised to call/return if her rash does not improve.

## 2014-10-25 NOTE — Telephone Encounter (Signed)
LEFT MESSAGE ON [facility] VOICE MAIL LEFT MESSAGE ON PATIENT'S ANSWER MACHINE- ASK PATIENT TO CONTACT CANCER CENTER.

## 2014-10-25 NOTE — Progress Notes (Signed)
Right upper and lower extremity venous duplex completed.  Right:  No evidence of DVT, superficial thrombosis, or Baker's cyst in the right lower extremity.  DVT noted in the right axillary vein in the right upper extremity.  Right basilic vein and right brachial vein not visualized due to PICC.  Left:  Negative for DVT in the left common femoral vein and left subclavian vein.

## 2014-10-25 NOTE — Telephone Encounter (Signed)
See note below and please advise, Thanks! 

## 2014-10-25 NOTE — Telephone Encounter (Signed)
Patients daughter called and would like Mrs. Fullbright pain medication filled   Thank you

## 2014-10-25 NOTE — Assessment & Plan Note (Signed)
Patient had a right upper arm PICC line inserted on 10/17/2014.  She began complaining last night of right upper extremity pain that radiates to her right shoulder area.   On exam- no evidence of erythema, edema, warmth, or red streaks to the right arm.  There is some mild tenderness to the right upper posterior arm.  Patient has full range of motion with her right arm.  Doppler ultrasound obtained today did reveal a DVT to the right axilla region.  Results of ultrasound were called to patient's daughter-who is a Equities trader.  Advised daughter would prescribe Xarelto starter pack for patient for treatment of the DVT.  Patient takes chronic Plavix per her cardiologist Dr. Debara Pickett.  Have placed a call with Dr. Lysbeth Penner office regarding the continuation of the Plavix.  As soon as we care back from Dr. Lysbeth Penner office-will notify patient of plan for Plavix continuation.

## 2014-10-25 NOTE — Telephone Encounter (Signed)
Left voicemail advising that rx is ready for pick up. Rx placed upfront.

## 2014-10-25 NOTE — Assessment & Plan Note (Addendum)
Patient complains of some generalized chronic pain; is also complaining of increased pain to her right upper extremity.  Patient was diagnosed with a right upper extremity DVT earlier today.  Patient left the cancer Center this afternoon; and went to her previously arranged primary care provider appointment.  Patient was able to obtain her hydrocodone 7.5/325 mg elixir refill prescription from her primary care provider.

## 2014-10-25 NOTE — Telephone Encounter (Signed)
SPOKE TO [place] NP/DR SHERRILL PATIENT WAS DIAGNOSIS WITH RIGHT ARM DVT TODAY.  PATIENT WAS STARTED ON PLAVIX BY DR HILTY IN THE PAST.  Ms. Tammy Harrow, NP WANTED TO KNOW IF PLAVIX CAN BE STOPPED SINCE [name].  [name], WILL DEFER TO Dr Debara Pickett (out of office today) Please contact CANCER CENTER,AND PATIENT

## 2014-10-25 NOTE — Telephone Encounter (Signed)
Received a message from Dr. Lysbeth Penner (cardiologist) office.  He has advised that patient hold Plavix; since she will initiate Xarelto for newly diagnosed DVT.  Called patient's daughter (main contact #) to inform her that patient should hold any future Plavix when she initiates the Xarelto therapy.  Patient's daughter states that patient's pharmacy will not be able to fill the Xarelto starter pack until tomorrow morning due to availability.  Advised patient to call/return or go directly to the emergency department for any new worries or concerns whatsoever.

## 2014-10-25 NOTE — Telephone Encounter (Signed)
Left message for pt to call back regarding Xarelto and plavix prescription. Also called Dr. Lysbeth Penner office to see if plavix can be stopped. Per triage nurse at Dr. Lysbeth Penner office she will send Dr. Debara Pickett an inbasket and then let us know. Informed Cyndee Bacon,NP

## 2014-10-25 NOTE — Patient Instructions (Addendum)
Please change the insulin to 35 units each morning and 55 units at bedtime. check your blood sugar twice a day.  vary the time of day when you check, between before the 3 meals, and at bedtime.  also check if you have symptoms of your blood sugar being too high or too low.  please keep a record of the readings and bring it to your next appointment here.  You can write it on any piece of paper.  please call us sooner if your blood sugar goes below 70, or if you have a lot of readings over 200.   Please come back for a follow-up appointment in 6 weeks.  Here is a refill of your pain medication. Please carefully watch the discolored areas on both feet.  Call us if it is worse.   i have requested for you the a1c blood test.  Please have it drawn with your next blood test at the cancer center.

## 2014-10-25 NOTE — Assessment & Plan Note (Signed)
Patient's daughter used an apparent pumice stone to patient's bilateral feet recently.  Patient has 2 healing blistered areas to her bilateral great toes.  She also has a large (approx 3 cm in diameter) blood blister to the side of her right foot as well.  This area is somewhat tender; but does not appear infected at this time.  Patient was afebrile while at the cancer center.  Patient was advised to discontinue all use of the pumice stone in the future.  She was encouraged to use a moisturizing agent to her feet; and to keep close watch of these blistered areas to her feet.  She has been advised to call/return or go directly to the emergency department for any worsening of any of these areas to her feet.

## 2014-10-25 NOTE — Telephone Encounter (Signed)
i printed 

## 2014-10-26 ENCOUNTER — Ambulatory Visit
Admission: RE | Admit: 2014-10-26 | Discharge: 2014-10-26 | Disposition: A | Discharge status: Home / Self Care | Payer: Medicare Other | Source: Ambulatory Visit | Attending: Radiation Oncology | Admitting: Radiation Oncology

## 2014-10-26 ENCOUNTER — Other Ambulatory Visit: Payer: Self-pay

## 2014-10-26 DIAGNOSIS — F419 Anxiety disorder, unspecified: Secondary | ICD-10-CM | POA: Diagnosis not present

## 2014-10-26 DIAGNOSIS — K219 Gastro-esophageal reflux disease without esophagitis: Secondary | ICD-10-CM | POA: Diagnosis not present

## 2014-10-26 DIAGNOSIS — E1143 Type 2 diabetes mellitus with diabetic autonomic (poly)neuropathy: Secondary | ICD-10-CM | POA: Diagnosis not present

## 2014-10-26 DIAGNOSIS — Z510 Encounter for antineoplastic radiation therapy: Secondary | ICD-10-CM | POA: Diagnosis not present

## 2014-10-26 DIAGNOSIS — F329 Major depressive disorder, single episode, unspecified: Secondary | ICD-10-CM | POA: Diagnosis not present

## 2014-10-26 DIAGNOSIS — I10 Essential (primary) hypertension: Secondary | ICD-10-CM | POA: Diagnosis not present

## 2014-10-26 DIAGNOSIS — C169 Malignant neoplasm of stomach, unspecified: Secondary | ICD-10-CM | POA: Diagnosis not present

## 2014-10-26 DIAGNOSIS — I472 Ventricular tachycardia: Secondary | ICD-10-CM | POA: Diagnosis not present

## 2014-10-26 DIAGNOSIS — E780 Pure hypercholesterolemia: Secondary | ICD-10-CM | POA: Diagnosis not present

## 2014-10-26 MED ORDER — MAGIC MOUTHWASH W/LIDOCAINE
5.0000 mL | Freq: Four times a day (QID) | ORAL | Status: DC | PRN
Start: 2014-10-26 — End: 2015-10-31

## 2014-10-26 NOTE — Telephone Encounter (Signed)
Pt daughter called stating pt had some mouth sores she spoke with Selena Lesser, NP yesterday. Informed NP order for magic mouthwash called into pharmacy. Called pt husband and informed him the prescription will be at there preferred pharmacy. Pt husband verbalized understanding.

## 2014-10-27 ENCOUNTER — Telehealth: Payer: Self-pay | Admitting: *Deleted

## 2014-10-27 ENCOUNTER — Ambulatory Visit: Payer: Medicare Other

## 2014-10-27 ENCOUNTER — Emergency Department (HOSPITAL_COMMUNITY): Payer: Medicare Other

## 2014-10-27 ENCOUNTER — Observation Stay (HOSPITAL_COMMUNITY)
Admission: EM | Admit: 2014-10-27 | Discharge: 2014-10-28 | Disposition: A | Discharge status: Home / Self Care | Payer: Medicare Other | Attending: Internal Medicine | Admitting: Internal Medicine

## 2014-10-27 ENCOUNTER — Encounter (HOSPITAL_COMMUNITY): Payer: Self-pay | Admitting: *Deleted

## 2014-10-27 ENCOUNTER — Other Ambulatory Visit: Payer: Self-pay

## 2014-10-27 DIAGNOSIS — Z794 Long term (current) use of insulin: Secondary | ICD-10-CM | POA: Diagnosis not present

## 2014-10-27 DIAGNOSIS — Z7902 Long term (current) use of antithrombotics/antiplatelets: Secondary | ICD-10-CM | POA: Insufficient documentation

## 2014-10-27 DIAGNOSIS — F419 Anxiety disorder, unspecified: Secondary | ICD-10-CM | POA: Insufficient documentation

## 2014-10-27 DIAGNOSIS — T451X5A Adverse effect of antineoplastic and immunosuppressive drugs, initial encounter: Secondary | ICD-10-CM | POA: Insufficient documentation

## 2014-10-27 DIAGNOSIS — S90821D Blister (nonthermal), right foot, subsequent encounter: Secondary | ICD-10-CM

## 2014-10-27 DIAGNOSIS — Z923 Personal history of irradiation: Secondary | ICD-10-CM | POA: Insufficient documentation

## 2014-10-27 DIAGNOSIS — Z79899 Other long term (current) drug therapy: Secondary | ICD-10-CM | POA: Diagnosis not present

## 2014-10-27 DIAGNOSIS — M818 Other osteoporosis without current pathological fracture: Secondary | ICD-10-CM | POA: Diagnosis not present

## 2014-10-27 DIAGNOSIS — L988 Other specified disorders of the skin and subcutaneous tissue: Secondary | ICD-10-CM | POA: Insufficient documentation

## 2014-10-27 DIAGNOSIS — J984 Other disorders of lung: Secondary | ICD-10-CM | POA: Diagnosis not present

## 2014-10-27 DIAGNOSIS — M79621 Pain in right upper arm: Principal | ICD-10-CM | POA: Insufficient documentation

## 2014-10-27 DIAGNOSIS — I82409 Acute embolism and thrombosis of unspecified deep veins of unspecified lower extremity: Secondary | ICD-10-CM | POA: Diagnosis present

## 2014-10-27 DIAGNOSIS — I10 Essential (primary) hypertension: Secondary | ICD-10-CM | POA: Diagnosis not present

## 2014-10-27 DIAGNOSIS — R21 Rash and other nonspecific skin eruption: Secondary | ICD-10-CM

## 2014-10-27 DIAGNOSIS — Z85028 Personal history of other malignant neoplasm of stomach: Secondary | ICD-10-CM | POA: Diagnosis not present

## 2014-10-27 DIAGNOSIS — R1111 Vomiting without nausea: Secondary | ICD-10-CM

## 2014-10-27 DIAGNOSIS — Z7901 Long term (current) use of anticoagulants: Secondary | ICD-10-CM | POA: Diagnosis not present

## 2014-10-27 DIAGNOSIS — M79601 Pain in right arm: Secondary | ICD-10-CM | POA: Diagnosis not present

## 2014-10-27 DIAGNOSIS — E1140 Type 2 diabetes mellitus with diabetic neuropathy, unspecified: Secondary | ICD-10-CM

## 2014-10-27 DIAGNOSIS — C169 Malignant neoplasm of stomach, unspecified: Secondary | ICD-10-CM

## 2014-10-27 DIAGNOSIS — E780 Pure hypercholesterolemia: Secondary | ICD-10-CM | POA: Diagnosis not present

## 2014-10-27 DIAGNOSIS — R112 Nausea with vomiting, unspecified: Secondary | ICD-10-CM | POA: Diagnosis not present

## 2014-10-27 DIAGNOSIS — I82621 Acute embolism and thrombosis of deep veins of right upper extremity: Secondary | ICD-10-CM

## 2014-10-27 DIAGNOSIS — F329 Major depressive disorder, single episode, unspecified: Secondary | ICD-10-CM | POA: Diagnosis not present

## 2014-10-27 DIAGNOSIS — Z87442 Personal history of urinary calculi: Secondary | ICD-10-CM | POA: Insufficient documentation

## 2014-10-27 DIAGNOSIS — K219 Gastro-esophageal reflux disease without esophagitis: Secondary | ICD-10-CM | POA: Diagnosis not present

## 2014-10-27 DIAGNOSIS — R000 Tachycardia, unspecified: Secondary | ICD-10-CM | POA: Diagnosis not present

## 2014-10-27 DIAGNOSIS — M7989 Other specified soft tissue disorders: Secondary | ICD-10-CM | POA: Diagnosis not present

## 2014-10-27 DIAGNOSIS — R1110 Vomiting, unspecified: Secondary | ICD-10-CM | POA: Diagnosis not present

## 2014-10-27 DIAGNOSIS — R1310 Dysphagia, unspecified: Secondary | ICD-10-CM

## 2014-10-27 DIAGNOSIS — M1990 Unspecified osteoarthritis, unspecified site: Secondary | ICD-10-CM | POA: Diagnosis not present

## 2014-10-27 DIAGNOSIS — C165 Malignant neoplasm of lesser curvature of stomach, unspecified: Secondary | ICD-10-CM

## 2014-10-27 DIAGNOSIS — G629 Polyneuropathy, unspecified: Secondary | ICD-10-CM | POA: Diagnosis not present

## 2014-10-27 DIAGNOSIS — L271 Localized skin eruption due to drugs and medicaments taken internally: Secondary | ICD-10-CM

## 2014-10-27 DIAGNOSIS — M79602 Pain in left arm: Secondary | ICD-10-CM | POA: Diagnosis present

## 2014-10-27 DIAGNOSIS — T148XXA Other injury of unspecified body region, initial encounter: Secondary | ICD-10-CM

## 2014-10-27 DIAGNOSIS — E109 Type 1 diabetes mellitus without complications: Secondary | ICD-10-CM | POA: Diagnosis not present

## 2014-10-27 DIAGNOSIS — Z86718 Personal history of other venous thrombosis and embolism: Secondary | ICD-10-CM | POA: Diagnosis not present

## 2014-10-27 DIAGNOSIS — E1142 Type 2 diabetes mellitus with diabetic polyneuropathy: Secondary | ICD-10-CM | POA: Diagnosis present

## 2014-10-27 DIAGNOSIS — R110 Nausea: Secondary | ICD-10-CM

## 2014-10-27 DIAGNOSIS — S40821A Blister (nonthermal) of right upper arm, initial encounter: Secondary | ICD-10-CM | POA: Diagnosis not present

## 2014-10-27 DIAGNOSIS — S90829A Blister (nonthermal), unspecified foot, initial encounter: Secondary | ICD-10-CM | POA: Diagnosis present

## 2014-10-27 DIAGNOSIS — E1159 Type 2 diabetes mellitus with other circulatory complications: Secondary | ICD-10-CM | POA: Diagnosis present

## 2014-10-27 HISTORY — DX: Acute embolism and thrombosis of unspecified deep veins of unspecified lower extremity: I82.409

## 2014-10-27 LAB — I-STAT CHEM 8, ED
BUN: 14 mg/dL (ref 6–23)
CALCIUM ION: 1.26 mmol/L (ref 1.13–1.30)
Chloride: 101 mmol/L (ref 96–112)
Creatinine, Ser: 0.7 mg/dL (ref 0.50–1.10)
GLUCOSE: 134 mg/dL — AB (ref 70–99)
HCT: 35 % — ABNORMAL LOW (ref 36.0–46.0)
HEMOGLOBIN: 11.9 g/dL — AB (ref 12.0–15.0)
Potassium: 4.3 mmol/L (ref 3.5–5.1)
SODIUM: 138 mmol/L (ref 135–145)
TCO2: 23 mmol/L (ref 0–100)

## 2014-10-27 LAB — CBC WITH DIFFERENTIAL/PLATELET
Basophils Absolute: 0 10*3/uL (ref 0.0–0.1)
Basophils Relative: 0 % (ref 0–1)
EOS ABS: 0.1 10*3/uL (ref 0.0–0.7)
Eosinophils Relative: 3 % (ref 0–5)
HCT: 32.8 % — ABNORMAL LOW (ref 36.0–46.0)
Hemoglobin: 10.6 g/dL — ABNORMAL LOW (ref 12.0–15.0)
LYMPHS PCT: 18 % (ref 12–46)
Lymphs Abs: 0.7 10*3/uL (ref 0.7–4.0)
MCH: 28.4 pg (ref 26.0–34.0)
MCHC: 32.3 g/dL (ref 30.0–36.0)
MCV: 87.9 fL (ref 78.0–100.0)
Monocytes Absolute: 0.5 10*3/uL (ref 0.1–1.0)
Monocytes Relative: 14 % — ABNORMAL HIGH (ref 3–12)
NEUTROS PCT: 65 % (ref 43–77)
Neutro Abs: 2.4 10*3/uL (ref 1.7–7.7)
PLATELETS: 288 10*3/uL (ref 150–400)
RBC: 3.73 MIL/uL — ABNORMAL LOW (ref 3.87–5.11)
RDW: 20 % — ABNORMAL HIGH (ref 11.5–15.5)
WBC: 3.7 10*3/uL — ABNORMAL LOW (ref 4.0–10.5)

## 2014-10-27 LAB — PROTIME-INR
INR: 1.74 — ABNORMAL HIGH (ref 0.00–1.49)
INR: 1.25 (ref 0.00–1.49)
PROTHROMBIN TIME: 20.5 s — AB (ref 11.6–15.2)
PROTHROMBIN TIME: 15.8 s — AB (ref 11.6–15.2)

## 2014-10-27 LAB — GLUCOSE, CAPILLARY
GLUCOSE-CAPILLARY: 174 mg/dL — AB (ref 70–99)
GLUCOSE-CAPILLARY: 194 mg/dL — AB (ref 70–99)
GLUCOSE-CAPILLARY: 253 mg/dL — AB (ref 70–99)
Glucose-Capillary: 135 mg/dL — ABNORMAL HIGH (ref 70–99)

## 2014-10-27 LAB — I-STAT TROPONIN, ED: TROPONIN I, POC: 0.03 ng/mL (ref 0.00–0.08)

## 2014-10-27 LAB — TROPONIN I: Troponin I: 0.12 ng/mL — ABNORMAL HIGH (ref <0.031)

## 2014-10-27 LAB — TSH: TSH: 1.166 u[IU]/mL (ref 0.350–4.500)

## 2014-10-27 MED ORDER — ONDANSETRON HCL 4 MG PO TABS: 4.0000 mg | ORAL_TABLET | Freq: Four times a day (QID) | ORAL | Status: DC | PRN

## 2014-10-27 MED ORDER — JEVITY 1.2 CAL PO LIQD
1000.0000 mL | ORAL | Status: DC
Start: 2014-10-27 — End: 2014-10-27

## 2014-10-27 MED ORDER — GLUCERNA 1.2 CAL PO LIQD
1000.0000 mL | ORAL | Status: DC
  Filled 2014-10-27: qty 1000

## 2014-10-27 MED ORDER — MORPHINE SULFATE 2 MG/ML IJ SOLN: 2.0000 mg | Freq: Once | INTRAMUSCULAR | Status: DC

## 2014-10-27 MED ORDER — ACETAMINOPHEN 325 MG PO TABS
650.0000 mg | ORAL_TABLET | Freq: Four times a day (QID) | ORAL | Status: DC | PRN
Start: 2014-10-27 — End: 2014-10-28

## 2014-10-27 MED ORDER — ENOXAPARIN SODIUM 80 MG/0.8ML ~~LOC~~ SOLN
70.0000 mg | Freq: Once | SUBCUTANEOUS | Status: AC
  Administered 2014-10-27: 70 mg via SUBCUTANEOUS
  Filled 2014-10-27: qty 0.8

## 2014-10-27 MED ORDER — METOPROLOL TARTRATE 25 MG PO TABS
25.0000 mg | ORAL_TABLET | Freq: Two times a day (BID) | ORAL | Status: DC
  Administered 2014-10-27 – 2014-10-28 (×3): 25 mg via ORAL
  Filled 2014-10-27 (×4): qty 1

## 2014-10-27 MED ORDER — INSULIN ASPART PROT & ASPART (70-30 MIX) 100 UNIT/ML ~~LOC~~ SUSP
30.0000 [IU] | Freq: Two times a day (BID) | SUBCUTANEOUS | Status: DC
  Administered 2014-10-27 – 2014-10-28 (×2): 30 [IU] via SUBCUTANEOUS
  Filled 2014-10-27: qty 10

## 2014-10-27 MED ORDER — INSULIN ASPART 100 UNIT/ML ~~LOC~~ SOLN
0.0000 [IU] | Freq: Three times a day (TID) | SUBCUTANEOUS | Status: DC
  Administered 2014-10-27: 5 [IU] via SUBCUTANEOUS
  Administered 2014-10-28 (×2): 3 [IU] via SUBCUTANEOUS

## 2014-10-27 MED ORDER — LORAZEPAM 1 MG PO TABS: 1.0000 mg | ORAL_TABLET | Freq: Every evening | ORAL | Status: DC | PRN

## 2014-10-27 MED ORDER — ONDANSETRON HCL 4 MG/2ML IJ SOLN
4.0000 mg | Freq: Four times a day (QID) | INTRAMUSCULAR | Status: DC | PRN
  Administered 2014-10-27: 4 mg via INTRAVENOUS
  Filled 2014-10-27: qty 2

## 2014-10-27 MED ORDER — ENOXAPARIN SODIUM 80 MG/0.8ML ~~LOC~~ SOLN
1.0000 mg/kg | Freq: Two times a day (BID) | SUBCUTANEOUS | Status: DC
  Administered 2014-10-27 – 2014-10-28 (×2): 65 mg via SUBCUTANEOUS
  Filled 2014-10-27 (×4): qty 0.8

## 2014-10-27 MED ORDER — SODIUM CHLORIDE 0.9 % IJ SOLN
10.0000 mL | INTRAMUSCULAR | Status: DC | PRN
  Administered 2014-10-27: 10 mL via INTRAVENOUS

## 2014-10-27 MED ORDER — HYDROCODONE-ACETAMINOPHEN 7.5-325 MG/15ML PO SOLN: 15.0000 mL | ORAL | Status: DC | PRN

## 2014-10-27 MED ORDER — MORPHINE SULFATE 2 MG/ML IJ SOLN
1.0000 mg | INTRAMUSCULAR | Status: DC | PRN
Start: 2014-10-27 — End: 2014-10-28

## 2014-10-27 MED ORDER — PANTOPRAZOLE SODIUM 40 MG PO TBEC
40.0000 mg | DELAYED_RELEASE_TABLET | Freq: Every day | ORAL | Status: DC
  Administered 2014-10-28: 40 mg via ORAL
  Filled 2014-10-27: qty 1

## 2014-10-27 MED ORDER — PROMETHAZINE HCL 25 MG PO TABS: 12.5000 mg | ORAL_TABLET | ORAL | Status: DC | PRN

## 2014-10-27 MED ORDER — HYDROCODONE-ACETAMINOPHEN 5-325 MG PO TABS: 1.0000 | ORAL_TABLET | ORAL | Status: DC | PRN

## 2014-10-27 MED ORDER — ROSUVASTATIN CALCIUM 10 MG PO TABS
10.0000 mg | ORAL_TABLET | Freq: Every day | ORAL | Status: DC
  Administered 2014-10-28: 10 mg via ORAL
  Filled 2014-10-27 (×2): qty 1

## 2014-10-27 MED ORDER — HEPARIN SOD (PORK) LOCK FLUSH 100 UNIT/ML IV SOLN
500.0000 [IU] | Freq: Once | INTRAVENOUS | Status: AC
  Administered 2014-10-27: 500 [IU]

## 2014-10-27 MED ORDER — ENOXAPARIN (LOVENOX) PATIENT EDUCATION KIT
PACK | Freq: Once | Status: AC
  Administered 2014-10-27: 16:00:00
  Filled 2014-10-27: qty 1

## 2014-10-27 MED ORDER — ACETAMINOPHEN 650 MG RE SUPP: 650.0000 mg | Freq: Four times a day (QID) | RECTAL | Status: DC | PRN

## 2014-10-27 MED ORDER — RADIAPLEXRX EX GEL: 1.0000 "application " | Freq: Every day | CUTANEOUS | Status: DC

## 2014-10-27 MED ORDER — IOHEXOL 350 MG/ML SOLN
100.0000 mL | Freq: Once | INTRAVENOUS | Status: AC | PRN
  Administered 2014-10-27: 100 mL via INTRAVENOUS

## 2014-10-27 MED ORDER — INSULIN ASPART PROT & ASPART (70-30 MIX) 100 UNIT/ML PEN: 30.0000 [IU] | PEN_INJECTOR | Freq: Two times a day (BID) | SUBCUTANEOUS | Status: DC

## 2014-10-27 MED ORDER — SODIUM CHLORIDE 0.9 % IV SOLN
INTRAVENOUS | Status: DC
  Administered 2014-10-27: 16:00:00 via INTRAVENOUS

## 2014-10-27 MED ORDER — GLUCERNA 1.2 CAL PO LIQD
1000.0000 mL | ORAL | Status: DC
  Administered 2014-10-27: 1000 mL
  Filled 2014-10-27 (×3): qty 1000

## 2014-10-27 MED ORDER — MIRTAZAPINE 15 MG PO TABS
15.0000 mg | ORAL_TABLET | Freq: Every day | ORAL | Status: DC
  Administered 2014-10-27: 15 mg via ORAL
  Filled 2014-10-27 (×2): qty 1

## 2014-10-27 MED ORDER — MAGIC MOUTHWASH W/LIDOCAINE
5.0000 mL | Freq: Four times a day (QID) | ORAL | Status: DC | PRN
  Filled 2014-10-27: qty 5

## 2014-10-27 MED ORDER — ONDANSETRON HCL 4 MG/2ML IJ SOLN: 4.0000 mg | Freq: Once | INTRAMUSCULAR | Status: DC

## 2014-10-27 NOTE — Progress Notes (Signed)
IP PROGRESS NOTE  Subjective:   Tammy Hughes presented with right arm discomfort yesterday and was diagnosed with a PICC related DVT. She was prescribed Xarelto and reports vomiting after taking Xarelto. She presented today with nausea/vomiting and left arm discomfort. The pain is better at present.  Objective: Vital signs in last 24 hours: Blood pressure 153/56, pulse 65, temperature 98.1 F (36.7 C), temperature source Oral, resp. rate 16, height 5' (1.524 m), weight 146 lb (66.225 kg), SpO2 99 %.  Intake/Output from previous day:    Physical Exam:  HEENT: No thrush or ulcers Lungs: Clear bilaterally Cardiac: Regular rate and rhythm Abdomen: Soft and nontender, left upper quadrant feeding tube site with a gauze dressing Extremities: No edema of the right or left arm. Skin: Erythema and skin thickening over the palms. Erythema at the soles with several areas of blistering at the heel and distal toes. Erythematous macular rash at the upper neck/back, scalp, and arms  Portacath/PICC-without erythema  Lab Results:  Recent Labs  10/27/14 0241 10/27/14 0250  WBC 3.7*  --   HGB 10.6* 11.9*  HCT 32.8* 35.0*  PLT 288  --     BMET  Recent Labs  10/27/14 0250  NA 138  K 4.3  CL 101  GLUCOSE 134*  BUN 14  CREATININE 0.70    Studies/Results: Dg Chest 2 View  10/27/2014   CLINICAL DATA:  Right arm DVT, now with left arm pain.  Nausea.  EXAM: CHEST  2 VIEW  COMPARISON:  08/02/14, 07/29/2014  FINDINGS: Tip of the right central line in the SVC. Lung volumes remain low. Borderline mild cardiomegaly. There are diffuse coarsened interstitial markings. Peripheral bibasilar scarring. Minimal patchy opacity in the right upper lung, also likely related to scar. No large pleural effusion or pneumothorax.  IMPRESSION: 1. Chronic lung disease with peripheral scarring. 2. Borderline mild cardiomegaly.   Electronically Signed   By: Jeb Levering M.D.   On: 10/27/2014 04:05   Ct Angio  Chest Pe W/cm &/or Wo Cm  10/27/2014   CLINICAL DATA:  Left arm pain and swelling. Known DVT in the right arm.  EXAM: CT ANGIOGRAPHY CHEST WITH CONTRAST  TECHNIQUE: Multidetector CT imaging of the chest was performed using the standard protocol during bolus administration of intravenous contrast. Multiplanar CT image reconstructions and MIPs were obtained to evaluate the vascular anatomy.  CONTRAST:  156mL OMNIPAQUE IOHEXOL 350 MG/ML SOLN  COMPARISON:  06/08/2014  FINDINGS: THORACIC INLET/BODY WALL:  Right upper extremity PICC is in good position. The left arm was injected, with no gross obstructive thrombus.  MEDIASTINUM:  Chronic cardiomegaly. No pericardial effusion. Diffuse atherosclerosis, including the coronary arteries. Faint opacification of the upper aorta, with no evidence of dissection. No pulmonary embolism identified.  LUNG WINDOWS:  There is chronic subpleural reticulation consistent with interstitial lung disease. Mild fibrotic features are seen, with occasional bronchial wall beading and interstitial distortion. No evidence for superimposed pneumonia or edema. No effusion or pneumothorax.  UPPER ABDOMEN:  Partially imaged gastric surgery changes.  OSSEOUS:  No acute fracture.  No suspicious lytic or blastic lesions.  Review of the MIP images confirms the above findings.  IMPRESSION: 1. Negative for pulmonary embolism. 2. Interstitial lung disease, favor fibrotic NSIP, stable from 2015.   Electronically Signed   By: Monte Fantasia M.D.   On: 10/27/2014 05:46    Medications: I have reviewed the patient's current medications.  Assessment/Plan: 1. Gastric cancer, status post an endoscopic biopsy of a lesser curvature  mass on 05/31/2014 confirming adenocarcinoma  Staging CTs of the chest and abdomen on 06/08/2014 revealed no evidence of metastatic disease  Proximal gastrectomy and feeding jejunostomy 07/20/2014, pT2,pN1  Adjuvant weekly 5-Fu/leucovorin 09/14/2014, 09/21/2014, 09/28/2014 and  10/05/2014  Initiation of radiation and infusional 5-FU 10/17/2014  2. Pulmonary fibrosis 3. Esophageal stricture, status post a dilatation procedure 05/31/2014 4. Diabetes 5. Postoperative ventricle tachycardia, NSTEMI January 2016 6. Hospital-acquired pneumonia January 2016 7. CT abdomen/pelvis 09/02/2014 with postsurgical changes. Focal fluid collection in the midline abutting the distal greater curvature of the stomach measuring 2.2 x 2.9 x 3.4 cm. Mild stranding in the adjacent fat. 8. PICC placement 10/17/2014 9. Rash. Most likely related to the 5-fluorouracil. 10. Hand-foot syndrome secondary to 5-fluorouracil 11. Right axillary vein DVT diagnosed 10/25/2014, PICC related 12. Nausea-likely related to gastric radiation  Tammy Hughes has a history of gastric cancer. She is completing adjuvant therapy with infusional 5-fluorouracil and radiation. She has developed skin toxicity from the 5-fluorouracil. The 5-fluorouracil will be placed on hold.  She has a PICC related right upper extremity DVT. I recommend daily Lovenox if she is unable to take Xarelto.  Recommendations: 1. Discontinue 5-fluorouracil 2. Lovenox anticoagulation 3. Advance diet as tolerated 4. We will need to consider a treatment break from radiation if the nausea persists       [place], Landa  10/27/2014, 2:06 PM

## 2014-10-27 NOTE — Progress Notes (Signed)
INITIAL NUTRITION ASSESSMENT  DOCUMENTATION CODES Per approved criteria  -Not Applicable   INTERVENTION: -Diet advancement per MD -At 2100: Initiate Glucerna 1.2 @ goal rate of 100 ml/hr via j-tube for 12 hours overnight (9pm-9am).  -Tube feeding regimen provides 1440 kcal (80% of needs), 86 grams of protein, and 1159 ml of H2O.   -When diet is advanced, recommend Glucerna Shake po TID, each supplement provides 220 kcal and 10 grams of protein  -RD to continue to monitor  NUTRITION DIAGNOSIS: Inadequate oral intake related to inability to eat as evidenced by NPO status.   Goal: Pt to meet >/= 90% of their estimated nutrition needs   Monitor:  TF regimen & tolerance, weight, labs, I/O's  Reason for Assessment: Consult received to initiate and manage enteral nutrition support.  Admitting Dx: Left upper extremity pain and nausea.  ASSESSMENT: 81 y.o. female with past medical history of gastric cancer status post gastrectomy in January 2016. Patient is on 5-FU pump and concurrent radiation therapy. Patient complained about nausea and vomiting not able to keep down, she is anyway that eating or drinking very much because of previous gastrectomy.  Pt followed by [place] RD, last seen 4/04. Home TF regimen per patient: 4 cans Glucerna 1.2 @ 90 ml/hr overnight (9pm-6am). Free water flushes of 120 ml before and after continuous feedings. [place] RD recommended increase feedings to 5 cans Glucerna 1.2 continuously overnight. RD to order and RN notified of time of initiation and goal rate.  Pt states that she is able to eat. Sometimes she drinks Glucerna shakes during the day. RD to order when diet is advanced.  Pt's weight has remained stable. Nutrition focused physical exam shows no sign of depletion of muscle mass or body fat.  Labs reviewed: WNL  Height: Ht Readings from Last 1 Encounters:  10/27/14 5' (1.524 m)    Weight: Wt Readings from Last 1 Encounters:   10/27/14 146 lb (66.225 kg)    Ideal Body Weight: 100 lb  % Ideal Body Weight: 146 %  Wt Readings from Last 10 Encounters:  10/27/14 146 lb (66.225 kg)  10/25/14 146 lb 14.4 oz (66.633 kg)  10/25/14 146 lb (66.225 kg)  10/24/14 145 lb 4.8 oz (65.908 kg)  10/17/14 144 lb 12.8 oz (65.681 kg)  09/28/14 148 lb (67.132 kg)  09/28/14 147 lb 14.4 oz (67.087 kg)  09/14/14 150 lb 12.8 oz (68.402 kg)  09/06/14 149 lb 11.2 oz (67.903 kg)  08/23/14 147 lb 12.8 oz (67.042 kg)    Usual Body Weight: 163 lb  % Usual Body Weight: 90%  BMI:  Body mass index is 28.51 kg/(m^2).  Estimated Nutritional Needs: Kcal: 1800-2000 Protein: 100-110g Fluid: 1.8L/day  Skin: intact  Diet Order: Diet NPO time specified  EDUCATION NEEDS: -No education needs identified at this time   Intake/Output Summary (Last 24 hours) at 10/27/14 1356 Last data filed at 10/27/14 0942  Gross per 24 hour  Intake      0 ml  Output    550 ml  Net   -550 ml    Last BM: PTA   Labs:   Recent Labs Lab 10/24/14 1015 10/27/14 0250  NA 138 138  K 4.7 4.3  CL  --  101  CO2 28  --   BUN 12.9 14  CREATININE 0.8 0.70  CALCIUM 10.0  --   GLUCOSE 213* 134*    CBG (last 3)   Recent Labs  10/27/14 0943 10/27/14 1232  GLUCAP 174* 194*    Scheduled Meds: . enoxaparin (LOVENOX) injection  1 mg/kg Subcutaneous Q12H  . enoxaparin   Does not apply Once  . insulin aspart protamine- aspart  30 Units Subcutaneous BID WC  . metoprolol tartrate  25 mg Oral BID  . mirtazapine  15 mg Oral QHS  . pantoprazole  40 mg Oral Daily  . rosuvastatin  10 mg Oral Daily    Continuous Infusions: . sodium chloride    . feeding supplement (GLUCERNA 1.2 CAL)      Past Medical History  Diagnosis Date  . HYPERCHOLESTEROLEMIA 02/01/2008  . ANXIETY 03/08/2008  . PERIPHERAL NEUROPATHY 02/23/2007  . HYPERTENSION 03/08/2008  . GERD 02/23/2007  . DIVERTICULOSIS, COLON 03/08/2008  . OSTEOARTHRITIS 02/23/2007  . OSTEOPOROSIS  02/23/2007  . Urolithiasis   . Allergy     SEASONAL  . Cataract     REMOVED BILATERAL  . Depression   . Stomach cancer 07/20/14    invasive adenocarcinoma w/signet rings features  . DIABETES MELLITUS, TYPE I 02/23/2007  . DVT (deep venous thrombosis)     Past Surgical History  Procedure Laterality Date  . Cholecystectomy  1995  . Esophagogastroduodenoscopy  06/08/2002  . Gastrectomy N/A 07/20/2014    Procedure: PROXIMAL GASTRECTOMY;  Surgeon: Stark Klein, MD;  Location: [facility];  Service: General;  Laterality: N/A;  . Laparoscopy N/A 07/20/2014    Procedure: LAPAROSCOPY DIAGNOSTIC;  Surgeon: Stark Klein, MD;  Location: [facility];  Service: General;  Laterality: N/A;  . Gastrojejunostomy N/A 07/20/2014    Procedure: GASTROJEJUNOSTOMY;  Surgeon: Stark Klein, MD;  Location: [facility];  Service: General;  Laterality: N/A;    Clayton Bibles, MS, RD, LDN Pager: [phone] After Hours Pager: [phone]

## 2014-10-27 NOTE — ED Notes (Signed)
Pt was diagnosed with a DVT to right arm on 4/12; pt c/o left arm swelling and pain today; pt c/o nausea; pt is on chemo and radiation

## 2014-10-27 NOTE — ED Provider Notes (Signed)
CSN: [id number]     Arrival date & time 10/27/14  0031 History   First MD Initiated Contact with Patient 10/27/14 0140     Chief Complaint  Patient presents with  . Arm Pain     (Consider location/radiation/quality/duration/timing/severity/associated sxs/prior Treatment) Patient is a 81 y.o. female presenting with arm pain. The history is provided by the patient.  Arm Pain This is a new problem. The current episode started more than 2 days ago. The problem occurs constantly. The problem has not changed since onset.Pertinent negatives include no chest pain, no abdominal pain, no headaches and no shortness of breath. Nothing aggravates the symptoms. Nothing relieves the symptoms. She has tried nothing for the symptoms. The treatment provided no relief.  Diagnosed with RUE DVT secondary to picc line 4/12 has been having B arm pain.  Also vomiting secondary to chemo and radiation.    Past Medical History  Diagnosis Date  . HYPERCHOLESTEROLEMIA 02/01/2008  . ANXIETY 03/08/2008  . PERIPHERAL NEUROPATHY 02/23/2007  . HYPERTENSION 03/08/2008  . GERD 02/23/2007  . DIVERTICULOSIS, COLON 03/08/2008  . OSTEOARTHRITIS 02/23/2007  . OSTEOPOROSIS 02/23/2007  . Urolithiasis   . Allergy     SEASONAL  . Cataract     REMOVED BILATERAL  . Depression   . Stomach cancer 07/20/14    invasive adenocarcinoma w/signet rings features  . DIABETES MELLITUS, TYPE I 02/23/2007  . DVT (deep venous thrombosis)    Past Surgical History  Procedure Laterality Date  . Cholecystectomy  1995  . Esophagogastroduodenoscopy  06/08/2002  . Gastrectomy N/A 07/20/2014    Procedure: PROXIMAL GASTRECTOMY;  Surgeon: Stark Klein, MD;  Location: [facility];  Service: General;  Laterality: N/A;  . Laparoscopy N/A 07/20/2014    Procedure: LAPAROSCOPY DIAGNOSTIC;  Surgeon: Stark Klein, MD;  Location: [facility];  Service: General;  Laterality: N/A;  . Gastrojejunostomy N/A 07/20/2014    Procedure: GASTROJEJUNOSTOMY;  Surgeon: Stark Klein, MD;   Location: [facility];  Service: General;  Laterality: N/A;   Family History  Problem Relation Age of Onset  . Cancer Father     Prostate Cancer  . Cancer Sister     Breast Cancer  . Diabetes Sister   . Diabetes Sister    History  Substance Use Topics  . Smoking status: Never Smoker   . Smokeless tobacco: Never Used  . Alcohol Use: No   OB History    No data available     Review of Systems  Respiratory: Negative for shortness of breath.   Cardiovascular: Negative for chest pain.  Gastrointestinal: Negative for abdominal pain.  Neurological: Negative for headaches.  All other systems reviewed and are negative.     Allergies  Pirfenidone and Aspirin  Home Medications   Prior to Admission medications   Medication Sig Start Date End Date Taking? Authorizing Provider  Alum & Mag Hydroxide-Simeth (MAGIC MOUTHWASH W/LIDOCAINE) SOLN Take 5 mLs by mouth 4 (four) times daily as needed for mouth pain. 10/26/14  Yes Susanne Borders, NP  esomeprazole (NEXIUM) 40 MG capsule Take 1 capsule (40 mg total) by mouth daily at 12 noon. 09/06/14  Yes Renato Shin, MD  hyaluronate sodium (RADIAPLEXRX) GEL Apply 1 application topically daily. 10/18/14  Yes Kyung Rudd, MD  HYDROcodone-acetaminophen (HYCET) 7.5-325 mg/15 ml solution Take 15 mLs by mouth every 4 (four) hours as needed for moderate pain. 10/25/14  Yes Renato Shin, MD  insulin aspart protamine - aspart (NOVOLOG MIX 70/30 FLEXPEN) (70-30) 100 UNIT/ML FlexPen 35 units each  morning and 55 units at bedtime. Patient taking differently: Inject 40 Units into the skin 2 (two) times daily with a meal.  10/25/14  Yes Renato Shin, MD  LORazepam (ATIVAN) 1 MG tablet Take 1 mg by mouth at bedtime. As needed for sleep   Yes Historical Provider, MD  metoprolol tartrate (LOPRESSOR) 25 MG tablet Take 1 tablet (25 mg total) by mouth 2 (two) times daily. 09/06/14  Yes Renato Shin, MD  mirtazapine (REMERON) 15 MG tablet Take 1 tablet (15 mg total) by mouth at  bedtime. 10/05/14  Yes Ladell Pier, MD  promethazine (PHENERGAN) 12.5 MG tablet Take 1 tablet (12.5 mg total) by mouth every 4 (four) hours as needed for nausea or vomiting. 08/17/14  Yes Renato Shin, MD  Rivaroxaban (XARELTO STARTER PACK) 15 & 20 MG TBPK Take as directed on package: Start with one 15mg  tablet PO twice daily with food. On Day 22, switch to one 20mg  tablet once daily with food. 10/25/14  Yes Susanne Borders, NP  rosuvastatin (CRESTOR) 10 MG tablet Take 1 tablet (10 mg total) by mouth daily. 08/10/14  Yes Daniel J Angiulli, PA-C  clopidogrel (PLAVIX) 75 MG tablet Take 1 tablet (75 mg total) by mouth daily. 09/06/14   Renato Shin, MD  glucose blood (BAYER CONTOUR TEST) test strip Use to check blood sugar 2/day. Dx code E11.9 08/12/14   Renato Shin, MD  Insulin Pen Needle 31G X 8 MM MISC USE AS DIRECTED WITH INSULIN TWICE DAILY Dx: 250.01 02/01/13   Renato Shin, MD   BP 119/58 mmHg  Pulse 60  Temp(Src) 98.3 F (36.8 C) (Oral)  Resp 18  SpO2 95% Physical Exam  Constitutional: She is oriented to person, place, and time. She appears well-developed and well-nourished. No distress.  HENT:  Head: Normocephalic and atraumatic.  Mouth/Throat: Oropharynx is clear and moist.  Eyes: Conjunctivae are normal. Pupils are equal, round, and reactive to light.  Neck: Normal range of motion. Neck supple.  Cardiovascular: Normal rate, regular rhythm and intact distal pulses.   Pulmonary/Chest: Effort normal and breath sounds normal. No respiratory distress. She has no wheezes. She has no rales.  Abdominal: Soft. Bowel sounds are normal. There is no tenderness. There is no rebound and no guarding.  Musculoskeletal: Normal range of motion. She exhibits no tenderness.  Neurological: She is alert and oriented to person, place, and time.  Skin: Skin is warm and dry. No rash noted. No erythema.  Psychiatric: She has a normal mood and affect.    ED Course  Procedures (including critical care  time) Labs Review Labs Reviewed  CBC WITH DIFFERENTIAL/PLATELET - Abnormal; Notable for the following:    WBC 3.7 (*)    RBC 3.73 (*)    Hemoglobin 10.6 (*)    HCT 32.8 (*)    RDW 20.0 (*)    Monocytes Relative 14 (*)    All other components within normal limits  PROTIME-INR - Abnormal; Notable for the following:    Prothrombin Time 20.5 (*)    INR 1.74 (*)    All other components within normal limits  I-STAT CHEM 8, ED - Abnormal; Notable for the following:    Glucose, Bld 134 (*)    Hemoglobin 11.9 (*)    HCT 35.0 (*)    All other components within normal limits  I-STAT TROPOININ, ED    Imaging Review No results found.   EKG Interpretation   Date/Time:  Thursday Betti Goodenow 14 2016 03:03:46 EDT Ventricular Rate:  56 PR Interval:  188 QRS Duration: 80 QT Interval:  408 QTC Calculation: 393 R Axis:     Text Interpretation:  Sinus bradycardia Confirmed by [place]  MD,  Dafney Farler ([id number]) on 10/27/2014 3:23:40 AM      MDM   Final diagnoses:  None   Results for orders placed or performed during the hospital encounter of 10/27/14  CBC with Differential/Platelet  Result Value Ref Range   WBC 3.7 (L) 4.0 - 10.5 K/uL   RBC 3.73 (L) 3.87 - 5.11 MIL/uL   Hemoglobin 10.6 (L) 12.0 - 15.0 g/dL   HCT 32.8 (L) 36.0 - 46.0 %   MCV 87.9 78.0 - 100.0 fL   MCH 28.4 26.0 - 34.0 pg   MCHC 32.3 30.0 - 36.0 g/dL   RDW 20.0 (H) 11.5 - 15.5 %   Platelets 288 150 - 400 K/uL   Neutrophils Relative % 65 43 - 77 %   Lymphocytes Relative 18 12 - 46 %   Monocytes Relative 14 (H) 3 - 12 %   Eosinophils Relative 3 0 - 5 %   Basophils Relative 0 0 - 1 %   Neutro Abs 2.4 1.7 - 7.7 K/uL   Lymphs Abs 0.7 0.7 - 4.0 K/uL   Monocytes Absolute 0.5 0.1 - 1.0 K/uL   Eosinophils Absolute 0.1 0.0 - 0.7 K/uL   Basophils Absolute 0.0 0.0 - 0.1 K/uL   RBC Morphology RARE NRBCs    WBC Morphology MILD LEFT SHIFT (1-5% METAS, OCC MYELO, OCC BANDS)   Protime-INR  Result Value Ref Range   Prothrombin  Time 20.5 (H) 11.6 - 15.2 seconds   INR 1.74 (H) 0.00 - 1.49  I-stat chem 8, ed  Result Value Ref Range   Sodium 138 135 - 145 mmol/L   Potassium 4.3 3.5 - 5.1 mmol/L   Chloride 101 96 - 112 mmol/L   BUN 14 6 - 23 mg/dL   Creatinine, Ser 0.70 0.50 - 1.10 mg/dL   Glucose, Bld 134 (H) 70 - 99 mg/dL   Calcium, Ion 1.26 1.13 - 1.30 mmol/L   TCO2 23 0 - 100 mmol/L   Hemoglobin 11.9 (L) 12.0 - 15.0 g/dL   HCT 35.0 (L) 36.0 - 46.0 %  I-stat troponin, ED  Result Value Ref Range   Troponin i, poc 0.03 0.00 - 0.08 ng/mL   Comment 3           Dg Chest 2 View  10/27/2014   CLINICAL DATA:  Right arm DVT, now with left arm pain.  Nausea.  EXAM: CHEST  2 VIEW  COMPARISON:  08/02/14, 07/29/2014  FINDINGS: Tip of the right central line in the SVC. Lung volumes remain low. Borderline mild cardiomegaly. There are diffuse coarsened interstitial markings. Peripheral bibasilar scarring. Minimal patchy opacity in the right upper lung, also likely related to scar. No large pleural effusion or pneumothorax.  IMPRESSION: 1. Chronic lung disease with peripheral scarring. 2. Borderline mild cardiomegaly.   Electronically Signed   By: Jeb Levering M.D.   On: 10/27/2014 04:05   Ct Angio Chest Pe W/cm &/or Wo Cm  10/27/2014   CLINICAL DATA:  Left arm pain and swelling. Known DVT in the right arm.  EXAM: CT ANGIOGRAPHY CHEST WITH CONTRAST  TECHNIQUE: Multidetector CT imaging of the chest was performed using the standard protocol during bolus administration of intravenous contrast. Multiplanar CT image reconstructions and MIPs were obtained to evaluate the vascular anatomy.  CONTRAST:  133mL OMNIPAQUE IOHEXOL 350  MG/ML SOLN  COMPARISON:  06/08/2014  FINDINGS: THORACIC INLET/BODY WALL:  Right upper extremity PICC is in good position. The left arm was injected, with no gross obstructive thrombus.  MEDIASTINUM:  Chronic cardiomegaly. No pericardial effusion. Diffuse atherosclerosis, including the coronary arteries. Faint  opacification of the upper aorta, with no evidence of dissection. No pulmonary embolism identified.  LUNG WINDOWS:  There is chronic subpleural reticulation consistent with interstitial lung disease. Mild fibrotic features are seen, with occasional bronchial wall beading and interstitial distortion. No evidence for superimposed pneumonia or edema. No effusion or pneumothorax.  UPPER ABDOMEN:  Partially imaged gastric surgery changes.  OSSEOUS:  No acute fracture.  No suspicious lytic or blastic lesions.  Review of the MIP images confirms the above findings.  IMPRESSION: 1. Negative for pulmonary embolism. 2. Interstitial lung disease, favor fibrotic NSIP, stable from 2015.   Electronically Signed   By: Monte Fantasia M.D.   On: 10/27/2014 05:46   Ir Fluoro Guide Cv Line Right  10/17/2014   CLINICAL DATA:  Gastric carcinoma  EXAM: RIGHT UPPER EXTREMITY PICC LINE PLACEMENT WITH ULTRASOUND AND FLUOROSCOPIC GUIDANCE  FLUOROSCOPY TIME:  6 seconds.  PROCEDURE: The patient was advised of the possible risks andcomplications and agreed to undergo the procedure. The patient was then brought to the angiographic suite for the procedure.  The right arm was prepped with chlorhexidine, drapedin the usual sterile fashion using maximum barrier technique (cap and mask, sterile gown, sterile gloves, large sterile sheet, hand hygiene and cutaneous antisepsis) and infiltrated locally with 1% Lidocaine.  Ultrasound demonstrated patency of the right basilic vein, and this was documented with an image. Under real-time ultrasound guidance, this vein was accessed with a 21 gauge micropuncture needle and image documentation was performed. A 0.018 wire was introduced in to the vein. Over this, a 5 [place] single lumen power PICC was advanced to the lower SVC/right atrial junction. Fluoroscopy during the procedure and fluoro spot radiograph confirms appropriate catheter position. The catheter was flushed and covered with asterile dressing.   Catheter length: 35 cm.  Complications: None  IMPRESSION: Successful right arm Power PICC line placement with ultrasound and fluoroscopic guidance. The catheter is ready for use.   Electronically Signed   By: Marybelle Killings M.D.   On: 10/17/2014 11:30   Ir US Guide Vasc Access Right  10/18/2014   CLINICAL DATA:  PICC line  EXAM: IR ULTRASOUND GUIDANCE VASC ACCESS RIGHT  COMPARISON:  None.  FINDINGS: Please refer to accession # [id number].  IMPRESSION: Please refer to Accession # [id number].   Electronically Signed   By: Marybelle Killings M.D.   On: 10/18/2014 09:01      Patient has a DVT in the RUE that is the extremity with a picc line this will need admission as needs removal and replacement for Chemo.  Moreover, cannot hold anything down due to chemo and radiation and therefore cannot hold down PO anticoagulation have given lovenox will need doppler of LUE as now is having pain there as well  Called for admission x 5    Tammy Fletchall, MD 10/27/14 [phone]

## 2014-10-27 NOTE — ED Notes (Signed)
Nurse will pull labs from PICC line

## 2014-10-27 NOTE — ED Notes (Signed)
Patient transported to X-ray 

## 2014-10-27 NOTE — Consult Note (Signed)
WOC wound consult note Reason for Consult: sacrum and feet Met with oncologist, CNS and RN at the bedside.  Pt with foot /mouth dx from 5FU, however prior to the arrival of oncologist on the floor we were concerned about a recent pedicure per the daughter at home.  She apparently used a pumice stone on the toes and heels where she now has intact serum and serous filled blisters.  The right heel presents like DTI, however unclear if from trauma of pressure.  Pt reports she has only been up out of bed to use the bathroom. High risk with DM and immunosuppression.  Wound type: foot mouth dx, blisters (? Trauma from pedicure)  Measurement: Bilateral medial great toes each aprox. 3cm x 1.0cm x 0, serous filled with some purple centrally Right medial heel: 6cm x 4cm x 0: blood filled blisters Left medial foot: small purple area 0.5cm x 0.5cm x0 dark, maroon area Wound bed: see above Drainage (amount, consistency, odor) none Periwound:ertyhema from foot/mouth dx from chemo Dressing procedure/placement/frequency: Off load heels with Prevalon boots, oncology to stop 5FU.  No topical wound care.   Discussed POC with patient and bedside nurse.  Re consult if needed, will not follow at this time. Thanks  Epifania Littrell Kellogg, [place] [phone])

## 2014-10-27 NOTE — Telephone Encounter (Signed)
Pt 52fu pump d/c after being admitted to rm 1322. Line flushed with NS and  heprin flush. Total volume infused from bag 34ml out of 173ml bag. Pt admitted due to left arm pain. Pt complaining of bilateral foot tenderness. RN looked at pt feet, noticed large bruise to right heel, several red areas on tips of toes. Husband reports pt had a pedicure by a family member on Sunday. Will review with Dr. Learta Codding and RN.

## 2014-10-27 NOTE — ED Notes (Signed)
Dr Elmahi at bedside. 

## 2014-10-27 NOTE — ED Notes (Signed)
Pt's daughter is irate d/t the wait time.  Pt's daughter was verbally aggressive w/ this Probation officer when I tried to apologize for the wait time and explain what tests were being ran as well as the rationale behind each one.  Pt's daughter screamed, "I am tiered and I am leaving now."  This writer explained that if pt were to leave now it would be against medical advise as we have not completed the workup.  Pt's daughter stormed out slamming the bedside table into the wall as well as slamming the door to the room into the wall.

## 2014-10-27 NOTE — Progress Notes (Signed)
*  Preliminary Results* Left upper extremity venous duplex completed. Left upper extremity is negative for deep and superficial vein thrombosis.  10/27/2014 3:23 PM  Maudry Mayhew, RVT, RDCS, RDMS

## 2014-10-27 NOTE — Telephone Encounter (Signed)
Received call from family member stating that patient is in the [facility] ED since midnight with L arm pain and is being admitted. No room assignment as this time.Family member wanted Dr. Benay Spice to know. Notified him and his nurse, Amy.

## 2014-10-27 NOTE — Progress Notes (Addendum)
Inpatient Diabetes Program Recommendations  AACE/ADA: New Consensus Statement on Inpatient Glycemic Control (2013)  Target Ranges:  Prepandial:   less than 140 mg/dL      Peak postprandial:   less than 180 mg/dL (1-2 hours)      Critically ill patients:  140 - 180 mg/dL     Results for JUNI, GLAAB (MRN [id number]) as of 10/27/2014 15:38  Ref. Range 10/27/2014 09:43 10/27/2014 12:32  Glucose-Capillary Latest Ref Range: 70-99 mg/dL 174 (H) 194 (H)     Admit with: Nausea and DVT from PICC line  History: DM, HTN, Gastric Cancer  Home DM Meds: 70/30 insulin- 40 units bidwc  Current DM Orders: 70/30 insulin- 30 units bidwc            Novolog Sensitive SSI tid     **Note patient is currently NPO.  **Note that patient will be getting TF at night only starting at 9PM.  **70/30 insulin not the greatest option for patients who are not eating well.  Patient at higher risk for Hypoglycemia when given 70/30 insulin while NPO b/c of the built in dose of Novolog into the 70/30 insulin dose.       MD- Please consider placing 70/30 insulin on hold for now until patient taking POs.  Please also consider changing Novolog Sensitive SSI to Q4 hour coverage.     Will follow Wyn Quaker RN, MSN, CDE Diabetes Coordinator Inpatient Diabetes Program Team Pager: [phone] (8a-5p)

## 2014-10-27 NOTE — H&P (Signed)
[facility] History and Physical  Tammy Hughes:[id number] DOB: [birth year] DOA: 10/27/2014  Referring physician: EDP PCP: Renato Shin, MD   Chief Complaint: Left upper extremity pain and nausea.  HPI: Tammy Hughes is a 81 y.o. female with past medical history of gastric cancer status post gastrectomy in January 2016. Patient is on 5-FU pump and concurrent radiation therapy. Patient had right-sided PICC line placed on 10/17/2014, soon after that she started to have right upper extremity pain, she was diagnosed with right upper extremity DVT on 10/24/2014, she seen [name] in the cancer center in place and Xarelto. Patient complained about nausea and vomiting not able to keep down, she is anyway that eating or drinking very much because of previous gastrectomy. Patient also is complaining about left upper extremity pain and she thinks she might have another clot in the other arm. In the ED initial troponin was negative, CT scan of the chest was done and showed no evidence of PE, started on Lovenox and admitted overnight for observation.  Review of Systems:  Constitutional: negative for anorexia, fevers and sweats Eyes: negative for irritation, redness and visual disturbance Ears, nose, mouth, throat, and face: negative for earaches, epistaxis, nasal congestion and sore throat Respiratory: negative for cough, dyspnea on exertion, sputum and wheezing Cardiovascular: negative for chest pain, dyspnea, lower extremity edema, orthopnea, palpitations and syncope Gastrointestinal:  Nausea and vomiting Genitourinary:negative for dysuria, frequency and hematuria Hematologic/lymphatic: negative for bleeding, easy bruising and lymphadenopathy Musculoskeletal: Left upper extremity pain  Neurological: negative for coordination problems, gait problems, headaches and weakness Endocrine: negative for diabetic symptoms including polydipsia, polyuria and weight loss Allergic/Immunologic:  negative for anaphylaxis, hay fever and urticaria  Past Medical History  Diagnosis Date  . HYPERCHOLESTEROLEMIA 02/01/2008  . ANXIETY 03/08/2008  . PERIPHERAL NEUROPATHY 02/23/2007  . HYPERTENSION 03/08/2008  . GERD 02/23/2007  . DIVERTICULOSIS, COLON 03/08/2008  . OSTEOARTHRITIS 02/23/2007  . OSTEOPOROSIS 02/23/2007  . Urolithiasis   . Allergy     SEASONAL  . Cataract     REMOVED BILATERAL  . Depression   . Stomach cancer 07/20/14    invasive adenocarcinoma w/signet rings features  . DIABETES MELLITUS, TYPE I 02/23/2007  . DVT (deep venous thrombosis)    Past Surgical History  Procedure Laterality Date  . Cholecystectomy  1995  . Esophagogastroduodenoscopy  06/08/2002  . Gastrectomy N/A 07/20/2014    Procedure: PROXIMAL GASTRECTOMY;  Surgeon: Stark Klein, MD;  Location: [facility];  Service: General;  Laterality: N/A;  . Laparoscopy N/A 07/20/2014    Procedure: LAPAROSCOPY DIAGNOSTIC;  Surgeon: Stark Klein, MD;  Location: [place];  Service: General;  Laterality: N/A;  . Gastrojejunostomy N/A 07/20/2014    Procedure: GASTROJEJUNOSTOMY;  Surgeon: Stark Klein, MD;  Location: [place];  Service: General;  Laterality: N/A;   Social History:   reports that she has never smoked. She has never used smokeless tobacco. She reports that she does not drink alcohol or use illicit drugs.  Allergies  Allergen Reactions  . Pirfenidone Diarrhea and Nausea And Vomiting  . Aspirin     rash    Family History  Problem Relation Age of Onset  . Cancer Father     Prostate Cancer  . Cancer Sister     Breast Cancer  . Diabetes Sister   . Diabetes Sister     Prior to Admission medications   Medication Sig Start Date End Date Taking? Authorizing Provider  Alum & Mag Hydroxide-Simeth (MAGIC MOUTHWASH W/LIDOCAINE) SOLN Take  5 mLs by mouth 4 (four) times daily as needed for mouth pain. 10/26/14  Yes Susanne Borders, NP  esomeprazole (NEXIUM) 40 MG capsule Take 1 capsule (40 mg total) by mouth daily at 12 noon.  09/06/14  Yes Renato Shin, MD  hyaluronate sodium (RADIAPLEXRX) GEL Apply 1 application topically daily. 10/18/14  Yes Kyung Rudd, MD  HYDROcodone-acetaminophen (HYCET) 7.5-325 mg/15 ml solution Take 15 mLs by mouth every 4 (four) hours as needed for moderate pain. 10/25/14  Yes Renato Shin, MD  insulin aspart protamine - aspart (NOVOLOG MIX 70/30 FLEXPEN) (70-30) 100 UNIT/ML FlexPen 35 units each morning and 55 units at bedtime. Patient taking differently: Inject 40 Units into the skin 2 (two) times daily with a meal.  10/25/14  Yes Renato Shin, MD  LORazepam (ATIVAN) 1 MG tablet Take 1 mg by mouth at bedtime. As needed for sleep   Yes Historical Provider, MD  metoprolol tartrate (LOPRESSOR) 25 MG tablet Take 1 tablet (25 mg total) by mouth 2 (two) times daily. 09/06/14  Yes Renato Shin, MD  mirtazapine (REMERON) 15 MG tablet Take 1 tablet (15 mg total) by mouth at bedtime. 10/05/14  Yes Ladell Pier, MD  promethazine (PHENERGAN) 12.5 MG tablet Take 1 tablet (12.5 mg total) by mouth every 4 (four) hours as needed for nausea or vomiting. 08/17/14  Yes Renato Shin, MD  Rivaroxaban (XARELTO STARTER PACK) 15 & 20 MG TBPK Take as directed on package: Start with one 15mg  tablet PO twice daily with food. On Day 22, switch to one 20mg  tablet once daily with food. 10/25/14  Yes Susanne Borders, NP  rosuvastatin (CRESTOR) 10 MG tablet Take 1 tablet (10 mg total) by mouth daily. 08/10/14  Yes Daniel J Angiulli, PA-C  clopidogrel (PLAVIX) 75 MG tablet Take 1 tablet (75 mg total) by mouth daily. 09/06/14   Renato Shin, MD  glucose blood (BAYER CONTOUR TEST) test strip Use to check blood sugar 2/day. Dx code E11.9 08/12/14   Renato Shin, MD  Insulin Pen Needle 31G X 8 MM MISC USE AS DIRECTED WITH INSULIN TWICE DAILY Dx: 250.01 02/01/13   Renato Shin, MD   Physical Exam: Filed Vitals:   10/27/14 0942  BP: 133/58  Pulse: 62  Temp: 97.8 F (36.6 C)  Resp: 16   Constitutional: Oriented to person, place, and  time. Well-developed and well-nourished. Cooperative.  Head: Normocephalic and atraumatic.  Nose: Nose normal.  Mouth/Throat: Uvula is midline, oropharynx is clear and moist and mucous membranes are normal.  Eyes: Conjunctivae and EOM are normal. Pupils are equal, round, and reactive to light.  Neck: Trachea normal and normal range of motion. Neck supple.  Cardiovascular: Normal rate, regular rhythm, S1 normal, S2 normal, normal heart sounds and intact distal pulses.   Pulmonary/Chest: Effort normal and breath sounds normal.  Abdominal: Soft. Bowel sounds are normal. There is no hepatosplenomegaly. There is no tenderness.  Musculoskeletal: Normal range of motion.  Neurological: Alert and oriented to person, place, and time. Has normal strength. No cranial nerve deficit or sensory deficit.  Skin: Skin is warm, dry and intact.  Psychiatric: Has a normal mood and affect. Speech is normal and behavior is normal.   Labs on Admission:  Basic Metabolic Panel:  Recent Labs Lab 10/24/14 1015 10/27/14 0250  NA 138 138  K 4.7 4.3  CL  --  101  CO2 28  --   GLUCOSE 213* 134*  BUN 12.9 14  CREATININE 0.8 0.70  CALCIUM 10.0  --  Liver Function Tests:  Recent Labs Lab 10/24/14 1015  AST 27  ALT 17  ALKPHOS 132  BILITOT 0.39  PROT 7.8  ALBUMIN 3.3*   No results for input(s): LIPASE, AMYLASE in the last 168 hours. No results for input(s): AMMONIA in the last 168 hours. CBC:  Recent Labs Lab 10/24/14 1015 10/27/14 0241 10/27/14 0250  WBC 3.4* 3.7*  --   NEUTROABS 2.1 2.4  --   HGB 11.6 10.6* 11.9*  HCT 35.6 32.8* 35.0*  MCV 87.7 87.9  --   PLT 331 288  --    Cardiac Enzymes: No results for input(s): CKTOTAL, CKMB, CKMBINDEX, TROPONINI in the last 168 hours.  BNP (last 3 results) No results for input(s): BNP in the last 8760 hours.  ProBNP (last 3 results) No results for input(s): PROBNP in the last 8760 hours.  CBG: No results for input(s): GLUCAP in the last 168  hours.  Radiological Exams on Admission: Dg Chest 2 View  10/27/2014   CLINICAL DATA:  Right arm DVT, now with left arm pain.  Nausea.  EXAM: CHEST  2 VIEW  COMPARISON:  08/02/14, 07/29/2014  FINDINGS: Tip of the right central line in the SVC. Lung volumes remain low. Borderline mild cardiomegaly. There are diffuse coarsened interstitial markings. Peripheral bibasilar scarring. Minimal patchy opacity in the right upper lung, also likely related to scar. No large pleural effusion or pneumothorax.  IMPRESSION: 1. Chronic lung disease with peripheral scarring. 2. Borderline mild cardiomegaly.   Electronically Signed   By: Jeb Levering M.D.   On: 10/27/2014 04:05   Ct Angio Chest Pe W/cm &/or Wo Cm  10/27/2014   CLINICAL DATA:  Left arm pain and swelling. Known DVT in the right arm.  EXAM: CT ANGIOGRAPHY CHEST WITH CONTRAST  TECHNIQUE: Multidetector CT imaging of the chest was performed using the standard protocol during bolus administration of intravenous contrast. Multiplanar CT image reconstructions and MIPs were obtained to evaluate the vascular anatomy.  CONTRAST:  13mL OMNIPAQUE IOHEXOL 350 MG/ML SOLN  COMPARISON:  06/08/2014  FINDINGS: THORACIC INLET/BODY WALL:  Right upper extremity PICC is in good position. The left arm was injected, with no gross obstructive thrombus.  MEDIASTINUM:  Chronic cardiomegaly. No pericardial effusion. Diffuse atherosclerosis, including the coronary arteries. Faint opacification of the upper aorta, with no evidence of dissection. No pulmonary embolism identified.  LUNG WINDOWS:  There is chronic subpleural reticulation consistent with interstitial lung disease. Mild fibrotic features are seen, with occasional bronchial wall beading and interstitial distortion. No evidence for superimposed pneumonia or edema. No effusion or pneumothorax.  UPPER ABDOMEN:  Partially imaged gastric surgery changes.  OSSEOUS:  No acute fracture.  No suspicious lytic or blastic lesions.  Review  of the MIP images confirms the above findings.  IMPRESSION: 1. Negative for pulmonary embolism. 2. Interstitial lung disease, favor fibrotic NSIP, stable from 2015.   Electronically Signed   By: Monte Fantasia M.D.   On: 10/27/2014 05:46    EKG: Independently reviewed. Sinus bradycardia at 56  Assessment/Plan Active Problems:   Essential hypertension   Type 2 diabetes mellitus with peripheral neuropathy   Long term current use of anticoagulant   Blister of foot   Left arm pain   DVT (deep venous thrombosis)    Left upper extremity pain Unclear etiology, patient had a recent right upper extremity DVT likely secondary to right-sided PICC line Obtain Doppler ultrasound of the left upper extremity. Rule out referred pain, rule out ACS by 3  sets of cardiac enzymes.   DVT, RUE Right upper extremity DVT, related to right sided PICC line. CT scan angiography of the chest show no evidence of PE. Started on Xarelto as outpatient, was not able to hold down. Switched to Lovenox.  Nausea and vomiting Patient has history of gastric cancer, status post gastrectomy and radiation. She is getting tube feeding, through a PEG tube, she eats for pleasure.  Type 2 diabetes mellitus with peripheral neuropathy Patient is on 70/30 insulin mix, restarted at 30 units twice a day. Patient also was restarted on her to feeding along with antiemetics.  Blister of the foot Patient has erythematous rash in her torso, she has blisters in both feet. Patient repeated that to recent pedicure she got, this is probably hand-foot syndrome secondary to the 5-FU.   Code Status:  Full code Family Communication: plan discussed with the patient in the presence of her husband bedside Disposition Plan:  Observation, MedSurg  Time spent: 39 minutes  Rome Echavarria A, MD [facility] Pager [phone]

## 2014-10-27 NOTE — ED Notes (Signed)
Patient transported to CT 

## 2014-10-27 NOTE — ED Notes (Signed)
Delay in EKG d/t equipment malfunction.  Dr. Randal Buba and Karna Christmas, CN are aware.  NT at bedside w/ portable EKG.

## 2014-10-28 ENCOUNTER — Ambulatory Visit
Admission: RE | Admit: 2014-10-28 | Discharge: 2014-10-28 | Disposition: A | Discharge status: Home / Self Care | Payer: Medicare Other | Source: Ambulatory Visit | Attending: Radiation Oncology | Admitting: Radiation Oncology

## 2014-10-28 ENCOUNTER — Telehealth: Payer: Self-pay | Admitting: *Deleted

## 2014-10-28 DIAGNOSIS — I82621 Acute embolism and thrombosis of deep veins of right upper extremity: Secondary | ICD-10-CM | POA: Diagnosis not present

## 2014-10-28 DIAGNOSIS — S90821D Blister (nonthermal), right foot, subsequent encounter: Secondary | ICD-10-CM | POA: Diagnosis not present

## 2014-10-28 DIAGNOSIS — E780 Pure hypercholesterolemia: Secondary | ICD-10-CM | POA: Diagnosis not present

## 2014-10-28 DIAGNOSIS — M79601 Pain in right arm: Secondary | ICD-10-CM | POA: Diagnosis not present

## 2014-10-28 DIAGNOSIS — Z510 Encounter for antineoplastic radiation therapy: Secondary | ICD-10-CM | POA: Diagnosis not present

## 2014-10-28 DIAGNOSIS — C165 Malignant neoplasm of lesser curvature of stomach, unspecified: Secondary | ICD-10-CM | POA: Diagnosis not present

## 2014-10-28 DIAGNOSIS — Z7901 Long term (current) use of anticoagulants: Secondary | ICD-10-CM | POA: Diagnosis not present

## 2014-10-28 DIAGNOSIS — M79602 Pain in left arm: Secondary | ICD-10-CM | POA: Diagnosis not present

## 2014-10-28 DIAGNOSIS — C169 Malignant neoplasm of stomach, unspecified: Secondary | ICD-10-CM

## 2014-10-28 DIAGNOSIS — K219 Gastro-esophageal reflux disease without esophagitis: Secondary | ICD-10-CM | POA: Diagnosis not present

## 2014-10-28 DIAGNOSIS — R21 Rash and other nonspecific skin eruption: Secondary | ICD-10-CM | POA: Diagnosis not present

## 2014-10-28 DIAGNOSIS — I10 Essential (primary) hypertension: Secondary | ICD-10-CM | POA: Diagnosis not present

## 2014-10-28 DIAGNOSIS — F419 Anxiety disorder, unspecified: Secondary | ICD-10-CM | POA: Diagnosis not present

## 2014-10-28 DIAGNOSIS — M79621 Pain in right upper arm: Secondary | ICD-10-CM | POA: Diagnosis not present

## 2014-10-28 LAB — GLUCOSE, CAPILLARY
GLUCOSE-CAPILLARY: 244 mg/dL — AB (ref 70–99)
Glucose-Capillary: 224 mg/dL — ABNORMAL HIGH (ref 70–99)

## 2014-10-28 LAB — CBC
HEMATOCRIT: 33.1 % — AB (ref 36.0–46.0)
Hemoglobin: 10.5 g/dL — ABNORMAL LOW (ref 12.0–15.0)
MCH: 28.4 pg (ref 26.0–34.0)
MCHC: 31.7 g/dL (ref 30.0–36.0)
MCV: 89.5 fL (ref 78.0–100.0)
Platelets: 295 10*3/uL (ref 150–400)
RBC: 3.7 MIL/uL — ABNORMAL LOW (ref 3.87–5.11)
RDW: 20.5 % — AB (ref 11.5–15.5)
WBC: 5.6 10*3/uL (ref 4.0–10.5)

## 2014-10-28 LAB — BASIC METABOLIC PANEL
Anion gap: 6 (ref 5–15)
BUN: 18 mg/dL (ref 6–23)
CO2: 23 mmol/L (ref 19–32)
CREATININE: 0.77 mg/dL (ref 0.50–1.10)
Calcium: 8.7 mg/dL (ref 8.4–10.5)
Chloride: 108 mmol/L (ref 96–112)
GFR calc non Af Amer: 78 mL/min — ABNORMAL LOW (ref >90)
Glucose, Bld: 199 mg/dL — ABNORMAL HIGH (ref 70–99)
POTASSIUM: 4 mmol/L (ref 3.5–5.1)
Sodium: 137 mmol/L (ref 135–145)

## 2014-10-28 LAB — HEMOGLOBIN A1C
HEMOGLOBIN A1C: 8.2 % — AB (ref 4.8–5.6)
Mean Plasma Glucose: 189 mg/dL

## 2014-10-28 LAB — TROPONIN I
TROPONIN I: 0.12 ng/mL — AB (ref <0.031)
TROPONIN I: 0.11 ng/mL — AB (ref <0.031)

## 2014-10-28 MED ORDER — ENOXAPARIN SODIUM 100 MG/ML ~~LOC~~ SOLN: 100.0000 mg | SUBCUTANEOUS | Status: DC

## 2014-10-28 MED ORDER — HEPARIN SOD (PORK) LOCK FLUSH 10 UNIT/ML IV SOLN: 10.0000 [IU] | INTRAVENOUS | Status: DC | PRN

## 2014-10-28 MED ORDER — PHENOL 1.4 % MT LIQD
1.0000 | OROMUCOSAL | Status: DC | PRN
  Filled 2014-10-28: qty 177

## 2014-10-28 NOTE — Progress Notes (Signed)
UR completed 

## 2014-10-28 NOTE — Progress Notes (Signed)
IP PROGRESS NOTE  Subjective:   The arm pain has improved. She had multiple bowel movements last night and this morning. She complains of throat soreness.  Objective: Vital signs in last 24 hours: Blood pressure 113/60, pulse 74, temperature 98.3 F (36.8 C), temperature source Oral, resp. rate 16, height 5' (1.524 m), weight 145 lb 12.8 oz (66.134 kg), SpO2 96 %.  Intake/Output from previous day: 04/14 0701 - 04/15 0700 In: 0  Out: 1050 [Urine:1050]  Physical Exam:  HEENT: No thrush or ulcers  Abdomen: Soft and nontender, left upper quadrant feeding tube site with a gauze dressing  Skin: Erythema and skin thickening over the palms. Erythema at the soles with several areas of blistering at the heel and distal toes. Erythematous macular rash at the upper neck/back, scalp, and arms, the erythema appears partially improved over the arms and hands/feet today     Lab Results:  Recent Labs  10/27/14 0241 10/27/14 0250 10/28/14 0618  WBC 3.7*  --  5.6  HGB 10.6* 11.9* 10.5*  HCT 32.8* 35.0* 33.1*  PLT 288  --  295    BMET  Recent Labs  10/27/14 0250 10/28/14 0618  NA 138 137  K 4.3 4.0  CL 101 108  CO2  --  23  GLUCOSE 134* 199*  BUN 14 18  CREATININE 0.70 0.77  CALCIUM  --  8.7    Studies/Results: Dg Chest 2 View  10/27/2014   CLINICAL DATA:  Right arm DVT, now with left arm pain.  Nausea.  EXAM: CHEST  2 VIEW  COMPARISON:  08/02/14, 07/29/2014  FINDINGS: Tip of the right central line in the SVC. Lung volumes remain low. Borderline mild cardiomegaly. There are diffuse coarsened interstitial markings. Peripheral bibasilar scarring. Minimal patchy opacity in the right upper lung, also likely related to scar. No large pleural effusion or pneumothorax.  IMPRESSION: 1. Chronic lung disease with peripheral scarring. 2. Borderline mild cardiomegaly.   Electronically Signed   By: Jeb Levering M.D.   On: 10/27/2014 04:05   Ct Angio Chest Pe W/cm &/or Wo Cm  10/27/2014    CLINICAL DATA:  Left arm pain and swelling. Known DVT in the right arm.  EXAM: CT ANGIOGRAPHY CHEST WITH CONTRAST  TECHNIQUE: Multidetector CT imaging of the chest was performed using the standard protocol during bolus administration of intravenous contrast. Multiplanar CT image reconstructions and MIPs were obtained to evaluate the vascular anatomy.  CONTRAST:  129mL OMNIPAQUE IOHEXOL 350 MG/ML SOLN  COMPARISON:  06/08/2014  FINDINGS: THORACIC INLET/BODY WALL:  Right upper extremity PICC is in good position. The left arm was injected, with no gross obstructive thrombus.  MEDIASTINUM:  Chronic cardiomegaly. No pericardial effusion. Diffuse atherosclerosis, including the coronary arteries. Faint opacification of the upper aorta, with no evidence of dissection. No pulmonary embolism identified.  LUNG WINDOWS:  There is chronic subpleural reticulation consistent with interstitial lung disease. Mild fibrotic features are seen, with occasional bronchial wall beading and interstitial distortion. No evidence for superimposed pneumonia or edema. No effusion or pneumothorax.  UPPER ABDOMEN:  Partially imaged gastric surgery changes.  OSSEOUS:  No acute fracture.  No suspicious lytic or blastic lesions.  Review of the MIP images confirms the above findings.  IMPRESSION: 1. Negative for pulmonary embolism. 2. Interstitial lung disease, favor fibrotic NSIP, stable from 2015.   Electronically Signed   By: Monte Fantasia M.D.   On: 10/27/2014 05:46    Medications: I have reviewed the patient's current medications.  Assessment/Plan:  1. Gastric cancer, status post an endoscopic biopsy of a lesser curvature mass on 05/31/2014 confirming adenocarcinoma  Staging CTs of the chest and abdomen on 06/08/2014 revealed no evidence of metastatic disease  Proximal gastrectomy and feeding jejunostomy 07/20/2014, pT2,pN1  Adjuvant weekly 5-Fu/leucovorin 09/14/2014, 09/21/2014, 09/28/2014 and 10/05/2014  Initiation of radiation  and infusional 5-FU 10/17/2014, 5-fluorouracil stopped 10/27/2014  2. Pulmonary fibrosis 3. Esophageal stricture, status post a dilatation procedure 05/31/2014 4. Diabetes 5. Postoperative ventricle tachycardia, NSTEMI January 2016 6. Hospital-acquired pneumonia January 2016 7. CT abdomen/pelvis 09/02/2014 with postsurgical changes. Focal fluid collection in the midline abutting the distal greater curvature of the stomach measuring 2.2 x 2.9 x 3.4 cm. Mild stranding in the adjacent fat. 8. PICC placement 10/17/2014 9. Rash. Most likely related to the 5-fluorouracil. 10. Hand-foot syndrome secondary to 5-fluorouracil 11. Right axillary vein DVT diagnosed 10/25/2014, PICC related, maintained on Lovenox 12. Nausea-likely related to gastric radiation  The hand/foot and skin changes appear partially improved today. She is stable for discharge from an oncology standpoint if she can tolerate the tube feedings and liquids by mouth. We will consider resuming the 5-FU at a reduced dose next week.  Recommendations: 1. Hold 5-fluorouracil 2. Lovenox anticoagulation-can convert to once daily dosing 3. Keep PICC in place, follow-up as scheduled at the Cancer center 10/31/2014        [facility] [place]  10/28/2014, 9:17 AM

## 2014-10-28 NOTE — Telephone Encounter (Signed)
Received VM message this am from pt's daughter asking if patient will be continuing her radiation treatments. Please call daughter, Rip Harbour back @ [phone]

## 2014-10-28 NOTE — Discharge Summary (Signed)
Physician Discharge Summary  Tammy Hughes SNK:[id number] DOB: July 19, 1932 DOA: 10/27/2014  PCP: Tammy Shin, MD  Admit date: 10/27/2014 Discharge date: 10/28/2014  Time spent: 40 minutes  Recommendations for Outpatient Follow-up:  1. Follow-up with Tammy Hughes in 1 week.  Discharge Diagnoses:  Active Problems:   Essential hypertension   Type 2 diabetes mellitus with peripheral neuropathy   Long term current use of anticoagulant   Blister of foot   Left arm pain   DVT (deep venous thrombosis)   Discharge Condition: Stable  Diet recommendation: Heart healthy, patient is on tube feeding 90 mL/hour for nighttime.  Filed Weights   10/27/14 0942 10/28/14 0444  Weight: 66.225 kg (146 lb) 66.134 kg (145 lb 12.8 oz)    History of present illness:  Tammy Hughes is a 81 y.o. female with past medical history of gastric cancer status post gastrectomy in January 2016. Patient is on 5-FU pump and concurrent radiation therapy. Patient had right-sided PICC line placed on 10/17/2014, soon after that she started to have right upper extremity pain, she was diagnosed with right upper extremity DVT on 10/24/2014, she seen Tammy Hughes in the cancer center in place and Xarelto. Patient complained about nausea and vomiting not able to keep down, she is anyway that eating or drinking very much because of previous gastrectomy. Patient also is complaining about left upper extremity pain and she thinks she might have another clot in the other arm. In the ED initial troponin was negative, CT scan of the chest was done and showed no evidence of PE, started on Lovenox and admitted overnight for observation.  Hospital Course:   Left upper extremity pain Unclear etiology, patient had a recent right upper extremity DVT likely secondary to right-sided PICC line Doppler ultrasound of the left upper extremity showed no evidence of DVT. The left upper extremity pain resolved. Had slight elevation of troponin,  pattern is not consistent with ACS.  DVT, RUE Right upper extremity DVT, related to right sided PICC line. CT scan angiography of the chest show no evidence of PE. Started on Xarelto as outpatient, was not able to hold down. Switched to Lovenox 100 mg daily.  Troponin elevation Patient has slight troponin of elevation of 0.12, 0.12 and 0.11. Patient denies any chest pain, no EKG changes. Patient had same problem before after surgery. Patient getting gastric radiation which probably affecting the heart and can probably cause the slight troponin elevation. Anyway the flat curve of troponin does not suggest ACS, patient is on Plavix, metoprolol and will be on Lovenox.  Nausea and vomiting Patient has history of gastric cancer, status post gastrectomy and radiation. She is getting tube feeding, through a PEG tube, she eats for pleasure. This is much better than yesterday.  Type 2 diabetes mellitus with peripheral neuropathy Patient is on 70/30 insulin mix, home dose restarted. Patient also was restarted on her to feeding along with antiemetics.  Blister of the foot Patient has erythematous rash in her torso, she has blisters in both feet. Patient repeated that to recent pedicure she got, this is probably hand-foot syndrome secondary to the 5-FU.  Procedures:  None  Consultations:  Oncology  Discharge Exam: Filed Vitals:   10/28/14 0444  BP: 113/60  Pulse: 74  Temp: 98.3 F (36.8 C)  Resp: 16   General: Alert and awake, oriented x3, not in any acute distress. HEENT: anicteric sclera, pupils reactive to light and accommodation, EOMI CVS: S1-S2 clear, no murmur rubs or gallops  Chest: clear to auscultation bilaterally, no wheezing, rales or rhonchi Abdomen: soft nontender, nondistended, normal bowel sounds, no organomegaly Extremities: no cyanosis, clubbing or edema noted bilaterally Neuro: Cranial nerves II-XII intact, no focal neurological deficits Skin: There is  erythematous rash in her chest, lower extremities. Boluses in both feet.  Discharge Instructions   Discharge Instructions    Diet - low sodium heart healthy    Complete by:  As directed      Increase activity slowly    Complete by:  As directed           Current Discharge Medication List    START taking these medications   Details  enoxaparin (LOVENOX) 100 MG/ML injection Inject 1 mL (100 mg total) into the skin daily. Qty: 30 Syringe, Refills: 5      CONTINUE these medications which have NOT CHANGED   Details  Alum & Mag Hydroxide-Simeth (MAGIC MOUTHWASH W/LIDOCAINE) SOLN Take 5 mLs by mouth 4 (four) times daily as needed for mouth pain. Qty: 120 mL, Refills: 1    esomeprazole (NEXIUM) 40 MG capsule Take 1 capsule (40 mg total) by mouth daily at 12 noon. Qty: 90 capsule, Refills: 2    hyaluronate sodium (RADIAPLEXRX) GEL Apply 1 application topically daily.    HYDROcodone-acetaminophen (HYCET) 7.5-325 mg/15 ml solution Take 15 mLs by mouth every 4 (four) hours as needed for moderate pain. Qty: 473 mL, Refills: 0    insulin aspart protamine - aspart (NOVOLOG MIX 70/30 FLEXPEN) (70-30) 100 UNIT/ML FlexPen 35 units each morning and 55 units at bedtime. Qty: 30 mL, Refills: 11    LORazepam (ATIVAN) 1 MG tablet Take 1 mg by mouth at bedtime. As needed for sleep    metoprolol tartrate (LOPRESSOR) 25 MG tablet Take 1 tablet (25 mg total) by mouth 2 (two) times daily. Qty: 180 tablet, Refills: 1    mirtazapine (REMERON) 15 MG tablet Take 1 tablet (15 mg total) by mouth at bedtime. Qty: 30 tablet, Refills: 0    promethazine (PHENERGAN) 12.5 MG tablet Take 1 tablet (12.5 mg total) by mouth every 4 (four) hours as needed for nausea or vomiting. Qty: 50 tablet, Refills: 2    rosuvastatin (CRESTOR) 10 MG tablet Take 1 tablet (10 mg total) by mouth daily. Qty: 90 tablet, Refills: 3    clopidogrel (PLAVIX) 75 MG tablet Take 1 tablet (75 mg total) by mouth daily. Qty: 90 tablet,  Refills: 1    glucose blood (BAYER CONTOUR TEST) test strip Use to check blood sugar 2/day. Dx code E11.9 Qty: 100 each, Refills: 2    Insulin Pen Needle 31G X 8 MM MISC USE AS DIRECTED WITH INSULIN TWICE DAILY Dx: 250.01 Qty: 200 each, Refills: prn      STOP taking these medications     Rivaroxaban (XARELTO STARTER PACK) 15 & 20 MG TBPK        Allergies  Allergen Reactions  . Pirfenidone Diarrhea and Nausea And Vomiting  . Aspirin     rash   Follow-up Information    Follow up with [name], MD In 1 week.   Specialty:  Oncology   Contact information:   [place] [address] [phone]        The results of significant diagnostics from this hospitalization (including imaging, microbiology, ancillary and laboratory) are listed below for reference.    Significant Diagnostic Studies: Dg Chest 2 View  10/27/2014   CLINICAL DATA:  Right arm DVT, now with left  arm pain.  Nausea.  EXAM: CHEST  2 VIEW  COMPARISON:  08/02/14, 07/29/2014  FINDINGS: Tip of the right central line in the SVC. Lung volumes remain low. Borderline mild cardiomegaly. There are diffuse coarsened interstitial markings. Peripheral bibasilar scarring. Minimal patchy opacity in the right upper lung, also likely related to scar. No large pleural effusion or pneumothorax.  IMPRESSION: 1. Chronic lung disease with peripheral scarring. 2. Borderline mild cardiomegaly.   Electronically Signed   By: Jeb Levering M.D.   On: 10/27/2014 04:05   Ct Angio Chest Pe W/cm &/or Wo Cm  10/27/2014   CLINICAL DATA:  Left arm pain and swelling. Known DVT in the right arm.  EXAM: CT ANGIOGRAPHY CHEST WITH CONTRAST  TECHNIQUE: Multidetector CT imaging of the chest was performed using the standard protocol during bolus administration of intravenous contrast. Multiplanar CT image reconstructions and MIPs were obtained to evaluate the vascular anatomy.  CONTRAST:  132mL OMNIPAQUE IOHEXOL 350 MG/ML SOLN   COMPARISON:  06/08/2014  FINDINGS: THORACIC INLET/BODY WALL:  Right upper extremity PICC is in good position. The left arm was injected, with no gross obstructive thrombus.  MEDIASTINUM:  Chronic cardiomegaly. No pericardial effusion. Diffuse atherosclerosis, including the coronary arteries. Faint opacification of the upper aorta, with no evidence of dissection. No pulmonary embolism identified.  LUNG WINDOWS:  There is chronic subpleural reticulation consistent with interstitial lung disease. Mild fibrotic features are seen, with occasional bronchial wall beading and interstitial distortion. No evidence for superimposed pneumonia or edema. No effusion or pneumothorax.  UPPER ABDOMEN:  Partially imaged gastric surgery changes.  OSSEOUS:  No acute fracture.  No suspicious lytic or blastic lesions.  Review of the MIP images confirms the above findings.  IMPRESSION: 1. Negative for pulmonary embolism. 2. Interstitial lung disease, favor fibrotic NSIP, stable from 2015.   Electronically Signed   By: Monte Fantasia M.D.   On: 10/27/2014 05:46   Ir Fluoro Guide Cv Line Right  10/17/2014   CLINICAL DATA:  Gastric carcinoma  EXAM: RIGHT UPPER EXTREMITY PICC LINE PLACEMENT WITH ULTRASOUND AND FLUOROSCOPIC GUIDANCE  FLUOROSCOPY TIME:  6 seconds.  PROCEDURE: The patient was advised of the possible risks andcomplications and agreed to undergo the procedure. The patient was then brought to the angiographic suite for the procedure.  The right arm was prepped with chlorhexidine, drapedin the usual sterile fashion using maximum barrier technique (cap and mask, sterile gown, sterile gloves, large sterile sheet, hand hygiene and cutaneous antisepsis) and infiltrated locally with 1% Lidocaine.  Ultrasound demonstrated patency of the right basilic vein, and this was documented with an image. Under real-time ultrasound guidance, this vein was accessed with a 21 gauge micropuncture needle and image documentation was performed. A 0.018  wire was introduced in to the vein. Over this, a 5 [place] single lumen power PICC was advanced to the lower SVC/right atrial junction. Fluoroscopy during the procedure and fluoro spot radiograph confirms appropriate catheter position. The catheter was flushed and covered with asterile dressing.  Catheter length: 35 cm.  Complications: None  IMPRESSION: Successful right arm Power PICC line placement with ultrasound and fluoroscopic guidance. The catheter is ready for use.   Electronically Signed   By: Marybelle Killings M.D.   On: 10/17/2014 11:30   Ir US Guide Vasc Access Right  10/18/2014   CLINICAL DATA:  PICC line  EXAM: IR ULTRASOUND GUIDANCE VASC ACCESS RIGHT  COMPARISON:  None.  FINDINGS: Please refer to accession # [id number].  IMPRESSION: Please refer to  Accession # [id number].   Electronically Signed   By: Marybelle Killings M.D.   On: 10/18/2014 09:01    Microbiology: No results found for this or any previous visit (from the past 240 hour(s)).   Labs: Basic Metabolic Panel:  Recent Labs Lab 10/24/14 1015 10/27/14 0250 10/28/14 0618  NA 138 138 137  K 4.7 4.3 4.0  CL  --  101 108  CO2 28  --  23  GLUCOSE 213* 134* 199*  BUN 12.9 14 18   CREATININE 0.8 0.70 0.77  CALCIUM 10.0  --  8.7   Liver Function Tests:  Recent Labs Lab 10/24/14 1015  AST 27  ALT 17  ALKPHOS 132  BILITOT 0.39  PROT 7.8  ALBUMIN 3.3*   No results for input(s): LIPASE, AMYLASE in the last 168 hours. No results for input(s): AMMONIA in the last 168 hours. CBC:  Recent Labs Lab 10/24/14 1015 10/27/14 0241 10/27/14 0250 10/28/14 0618  WBC 3.4* 3.7*  --  5.6  NEUTROABS 2.1 2.4  --   --   HGB 11.6 10.6* 11.9* 10.5*  HCT 35.6 32.8* 35.0* 33.1*  MCV 87.7 87.9  --  89.5  PLT 331 288  --  295   Cardiac Enzymes:  Recent Labs Lab 10/27/14 1730 10/28/14 0300 10/28/14 0618  TROPONINI 0.12* 0.12* 0.11*   BNP: BNP (last 3 results) No results for input(s): BNP in the last 8760 hours.  ProBNP (last 3  results) No results for input(s): PROBNP in the last 8760 hours.  CBG:  Recent Labs Lab 10/27/14 0943 10/27/14 1232 10/27/14 1723 10/27/14 2057 10/28/14 0822  GLUCAP 174* 194* 253* 135* 224*       Signed:  Orissa Arreaga A  [facility] 10/28/2014, 11:21 AM

## 2014-10-28 NOTE — Telephone Encounter (Signed)
Returned call to pt's daughter, confirmed pt is to continue radiation while chemo is on hold. She voiced understanding.

## 2014-10-28 NOTE — Progress Notes (Signed)
[facility] Therapy Treatment Record Phone [phone]   Radiation Therapy was administered to Tammy Hughes on: 10/28/2014  2:41 PM and was treatment # 9 out of a planned course of 25 treatments.

## 2014-10-28 NOTE — Progress Notes (Signed)
Department of Radiation Oncology  Phone:  [phone] Fax:        [phone]   Inpatient  Weekly Treatment Note    Name: Tammy Hughes Date: 10/28/2014 MRN: [id number] DOB: 1932-10-23   Current dose: 16.2 Gy  Current fraction: 9   MEDICATIONS: No current facility-administered medications for this encounter.   Current Outpatient Prescriptions  Medication Sig Dispense Refill  . enoxaparin (LOVENOX) 100 MG/ML injection Inject 1 mL (100 mg total) into the skin daily. 30 Syringe 5   Facility-Administered Medications Ordered in Other Encounters  Medication Dose Route Frequency Provider Last Rate Last Dose  . 0.9 %  sodium chloride infusion   Intravenous Continuous Verlee Monte, MD 10 mL/hr at 10/27/14 1602    . acetaminophen (TYLENOL) tablet 650 mg  650 mg Oral Q6H PRN Verlee Monte, MD       Or  . acetaminophen (TYLENOL) suppository 650 mg  650 mg Rectal Q6H PRN Verlee Monte, MD      . enoxaparin (LOVENOX) injection 65 mg  1 mg/kg Subcutaneous Q12H Verlee Monte, MD   65 mg at 10/28/14 0608  . feeding supplement (GLUCERNA 1.2 CAL) liquid 1,000 mL  1,000 mL Per Tube Continuous Clayton Bibles, RD 100 mL/hr at 10/27/14 2255 1,000 mL at 10/27/14 2255  . HYDROcodone-acetaminophen (NORCO/VICODIN) 5-325 MG per tablet 1-2 tablet  1-2 tablet Oral Q4H PRN Verlee Monte, MD      . insulin aspart (novoLOG) injection 0-9 Units  0-9 Units Subcutaneous TID WC Verlee Monte, MD   3 Units at 10/28/14 1216  . insulin aspart protamine- aspart (NOVOLOG MIX 70/30) injection 30 Units  30 Units Subcutaneous BID WC Verlee Monte, MD   30 Units at 10/28/14 0949  . LORazepam (ATIVAN) tablet 1 mg  1 mg Oral QHS PRN Verlee Monte, MD      . magic mouthwash w/lidocaine  5 mL Oral QID PRN Verlee Monte, MD      . metoprolol tartrate (LOPRESSOR) tablet 25 mg  25 mg Oral BID Verlee Monte, MD   25 mg at 10/28/14 1101  . mirtazapine (REMERON) tablet 15 mg  15 mg Oral QHS Verlee Monte, MD   15 mg at 10/27/14 2254    . morphine 2 MG/ML injection 1 mg  1 mg Intravenous Q4H PRN Verlee Monte, MD      . ondansetron (ZOFRAN) tablet 4 mg  4 mg Oral Q6H PRN Verlee Monte, MD       Or  . ondansetron (ZOFRAN) injection 4 mg  4 mg Intravenous Q6H PRN Verlee Monte, MD   4 mg at 10/27/14 1620  . pantoprazole (PROTONIX) EC tablet 40 mg  40 mg Oral Daily Verlee Monte, MD   40 mg at 10/28/14 1101  . phenol (CHLORASEPTIC) mouth spray 1 spray  1 spray Mouth/Throat PRN Ladell Pier, MD      . promethazine (PHENERGAN) tablet 12.5 mg  12.5 mg Oral Q4H PRN Verlee Monte, MD      . rosuvastatin (CRESTOR) tablet 10 mg  10 mg Oral Daily Verlee Monte, MD   10 mg at 10/28/14 1101  . sodium chloride 0.9 % injection 10 mL  10 mL Intravenous PRN Ladell Pier, MD   10 mL at 10/27/14 0955     ALLERGIES: Pirfenidone and Aspirin   LABORATORY DATA:  Lab Results  Component Value Date   WBC 5.6 10/28/2014   HGB 10.5* 10/28/2014   HCT 33.1* 10/28/2014   MCV 89.5 10/28/2014  PLT 295 10/28/2014   Lab Results  Component Value Date   NA 137 10/28/2014   K 4.0 10/28/2014   CL 108 10/28/2014   CO2 23 10/28/2014   Lab Results  Component Value Date   ALT 17 10/24/2014   AST 27 10/24/2014   ALKPHOS 132 10/24/2014   BILITOT 0.39 10/24/2014     NARRATIVE: Tammy Hughes was seen today for weekly treatment management. The chart was checked and the patient's films were reviewed.  The patient states that she is feeling much better. She currently is an inpatient. She states that she came into the hospital thinking that she was having a blood clot in the arm. She believes that she will be going home later today. No difficulties with nausea or diarrhea.  PHYSICAL EXAMINATION: vitals were not taken for this visit.     alert, somewhat frail appearing,  ASSESSMENT: The patient is doing satisfactorily with treatment.  PLAN: We will continue with the patient's radiation treatment as planned.

## 2014-10-31 ENCOUNTER — Other Ambulatory Visit (HOSPITAL_BASED_OUTPATIENT_CLINIC_OR_DEPARTMENT_OTHER): Payer: Medicare Other

## 2014-10-31 ENCOUNTER — Encounter: Payer: Self-pay | Admitting: Nutrition

## 2014-10-31 ENCOUNTER — Ambulatory Visit (HOSPITAL_BASED_OUTPATIENT_CLINIC_OR_DEPARTMENT_OTHER): Payer: Medicare Other | Admitting: Nurse Practitioner

## 2014-10-31 ENCOUNTER — Ambulatory Visit: Payer: Medicare Other

## 2014-10-31 ENCOUNTER — Ambulatory Visit
Admission: RE | Admit: 2014-10-31 | Discharge: 2014-10-31 | Disposition: A | Discharge status: Home / Self Care | Payer: Medicare Other | Source: Ambulatory Visit | Attending: Radiation Oncology | Admitting: Radiation Oncology

## 2014-10-31 ENCOUNTER — Encounter: Payer: Medicare Other | Admitting: Nutrition

## 2014-10-31 VITALS — BP 126/60 | HR 74 | Temp 97.7°F | Resp 17 | Ht 60.0 in | Wt 141.9 lb

## 2014-10-31 DIAGNOSIS — C169 Malignant neoplasm of stomach, unspecified: Secondary | ICD-10-CM

## 2014-10-31 DIAGNOSIS — I10 Essential (primary) hypertension: Secondary | ICD-10-CM | POA: Diagnosis not present

## 2014-10-31 DIAGNOSIS — C165 Malignant neoplasm of lesser curvature of stomach, unspecified: Secondary | ICD-10-CM

## 2014-10-31 DIAGNOSIS — E780 Pure hypercholesterolemia: Secondary | ICD-10-CM | POA: Diagnosis not present

## 2014-10-31 DIAGNOSIS — F419 Anxiety disorder, unspecified: Secondary | ICD-10-CM | POA: Diagnosis not present

## 2014-10-31 DIAGNOSIS — Z510 Encounter for antineoplastic radiation therapy: Secondary | ICD-10-CM | POA: Diagnosis not present

## 2014-10-31 DIAGNOSIS — I82621 Acute embolism and thrombosis of deep veins of right upper extremity: Secondary | ICD-10-CM

## 2014-10-31 DIAGNOSIS — K219 Gastro-esophageal reflux disease without esophagitis: Secondary | ICD-10-CM | POA: Diagnosis not present

## 2014-10-31 DIAGNOSIS — R21 Rash and other nonspecific skin eruption: Secondary | ICD-10-CM | POA: Diagnosis not present

## 2014-10-31 LAB — COMPREHENSIVE METABOLIC PANEL (CC13)
ALK PHOS: 99 U/L (ref 40–150)
ALT: 15 U/L (ref 0–55)
AST: 20 U/L (ref 5–34)
Albumin: 3.1 g/dL — ABNORMAL LOW (ref 3.5–5.0)
Anion Gap: 12 mEq/L — ABNORMAL HIGH (ref 3–11)
BILIRUBIN TOTAL: 0.29 mg/dL (ref 0.20–1.20)
BUN: 21.5 mg/dL (ref 7.0–26.0)
CO2: 25 mEq/L (ref 22–29)
CREATININE: 0.8 mg/dL (ref 0.6–1.1)
Calcium: 9.8 mg/dL (ref 8.4–10.4)
Chloride: 105 mEq/L (ref 98–109)
EGFR: 85 mL/min/{1.73_m2} — ABNORMAL LOW (ref >90)
Glucose: 163 mg/dl — ABNORMAL HIGH (ref 70–140)
Potassium: 3.9 mEq/L (ref 3.5–5.1)
Sodium: 142 mEq/L (ref 136–145)
Total Protein: 7.3 g/dL (ref 6.4–8.3)

## 2014-10-31 LAB — CBC WITH DIFFERENTIAL/PLATELET
BASO%: 0.4 % (ref 0.0–2.0)
BASOS ABS: 0 10*3/uL (ref 0.0–0.1)
EOS ABS: 0.1 10*3/uL (ref 0.0–0.5)
EOS%: 1.9 % (ref 0.0–7.0)
HEMATOCRIT: 35.8 % (ref 34.8–46.6)
HGB: 11.3 g/dL — ABNORMAL LOW (ref 11.6–15.9)
LYMPH%: 10.2 % — AB (ref 14.0–49.7)
MCH: 28.1 pg (ref 25.1–34.0)
MCHC: 31.6 g/dL (ref 31.5–36.0)
MCV: 88.7 fL (ref 79.5–101.0)
MONO#: 0.7 10*3/uL (ref 0.1–0.9)
MONO%: 10.7 % (ref 0.0–14.0)
NEUT%: 76.8 % (ref 38.4–76.8)
NEUTROS ABS: 4.8 10*3/uL (ref 1.5–6.5)
PLATELETS: 283 10*3/uL (ref 145–400)
RBC: 4.03 10*6/uL (ref 3.70–5.45)
RDW: 23.1 % — ABNORMAL HIGH (ref 11.2–14.5)
WBC: 6.2 10*3/uL (ref 3.9–10.3)
lymph#: 0.6 10*3/uL — ABNORMAL LOW (ref 0.9–3.3)

## 2014-10-31 NOTE — Progress Notes (Signed)
Chemotherapy was cancelled therefore, Nutrition follow up could not be completed.

## 2014-10-31 NOTE — Progress Notes (Addendum)
  [facility] OFFICE PROGRESS NOTE   Diagnosis:  Gastric cancer  INTERVAL HISTORY:   Tammy Hughes returns as scheduled. She was hospitalized on 10/27/2014 with left arm pain. She was noted to have significant skin toxicity from the 5-fluorouracil. The 5 for yourself pump was discontinued. She was discharged home on 10/28/2014.  She reports difficulty swallowing due to throat pain. Appetite is poor. She continues tube feedings. Her daughter has been giving her extra water through the feeding tube. Hands and feet are better. Diarrhea has improved as well. She had a normal bowel movement earlier today. No significant nausea.  Objective:  Vital signs in last 24 hours:  Blood pressure 126/60, pulse 74, temperature 97.7 F (36.5 C), temperature source Axillary, resp. rate 17, height 5' (1.524 m), weight 141 lb 14.4 oz (64.365 kg), SpO2 99 %.    HEENT: Large scabbed lesion at the lower lip. Mucous membranes are moist. No ulcers were visualized within the oral cavity. Cardio: Regular rate and rhythm. GI: Abdomen soft and nontender. No hepatomegaly. Feeding tube site without signs of infection. Vascular: No leg edema. The right lower leg is slightly larger than the left lower leg.  Skin: Palms and soles are dry appearing. Large blood blister at the left inner heel region. Erythematous lesions scattered around the neck, upper back and chest, forearms showing signs of healing. Right upper extremity PICC site is without erythema.    Lab Results:  Lab Results  Component Value Date   WBC 6.2 10/31/2014   HGB 11.3* 10/31/2014   HCT 35.8 10/31/2014   MCV 88.7 10/31/2014   PLT 283 10/31/2014   NEUTROABS 4.8 10/31/2014    Imaging:  No results found.  Medications: I have reviewed the patient's current medications.  Assessment/Plan: 1. Gastric cancer, status post an endoscopic biopsy of a lesser curvature mass on 05/31/2014 confirming adenocarcinoma  Staging CTs of the chest  and abdomen on 06/08/2014 revealed no evidence of metastatic disease  Proximal gastrectomy and feeding jejunostomy 07/20/2014, pT2,pN1  Adjuvant weekly 5-Fu/leucovorin 09/14/2014, 09/21/2014, 09/28/2014 and 10/05/2014  Initiation of radiation and infusional 5-FU 10/17/2014  5-fluorouracil pump discontinued on 10/27/2014 due to toxicity.  2. Pulmonary fibrosis 3. Esophageal stricture, status post a dilatation procedure 05/31/2014 4. Diabetes 5. Postoperative ventricle tachycardia, NSTEMI January 2016 6. Hospital-acquired pneumonia January 2016 7. CT abdomen/pelvis 09/02/2014 with postsurgical changes. Focal fluid collection in the midline abutting the distal greater curvature of the stomach measuring 2.2 x 2.9 x 3.4 cm. Mild stranding in the adjacent fat. 8. PICC placement 10/17/2014 9. Rash. Most likely related to the 5-fluorouracil. 10. Right upper extremity DVT 10/25/2014. On Xarelto. 11. Hospitalization 10/27/2014 through 10/28/2014 nausea/vomiting and left arm discomfort. Noted to have skin toxicity from the 5-fluorouracil. The 5-fluorouracil was discontinued.   Disposition: Tammy Hughes developed significant skin toxicity related to the 5-fluorouracil. The 5-fluorouracil was discontinued during the recent hospitalization. We will continue to hold further chemotherapy. She will continue radiation. We will see her in follow-up in one week.  Patient seen with Dr. Benay Spice.  Tammy Hughes, Tammy Hughes ANP/GNP-BC   10/31/2014  11:08 AM  This was a shared visit with Ned Card. Tammy Hughes was interviewed and examined.  5-fluorouracil will be discontinued for now. We will consider resuming bolus 5-FU/leucovorin at the completion of radiation. The skin changes appear partially improved today.  Julieanne Manson, M.D.

## 2014-11-01 ENCOUNTER — Telehealth: Payer: Self-pay | Admitting: *Deleted

## 2014-11-01 ENCOUNTER — Ambulatory Visit
Admission: RE | Admit: 2014-11-01 | Discharge: 2014-11-01 | Disposition: A | Discharge status: Home / Self Care | Payer: Medicare Other | Source: Ambulatory Visit | Attending: Radiation Oncology | Admitting: Radiation Oncology

## 2014-11-01 ENCOUNTER — Ambulatory Visit (HOSPITAL_BASED_OUTPATIENT_CLINIC_OR_DEPARTMENT_OTHER): Payer: Medicare Other | Admitting: Nurse Practitioner

## 2014-11-01 DIAGNOSIS — R21 Rash and other nonspecific skin eruption: Secondary | ICD-10-CM

## 2014-11-01 DIAGNOSIS — R1310 Dysphagia, unspecified: Secondary | ICD-10-CM | POA: Diagnosis not present

## 2014-11-01 DIAGNOSIS — C165 Malignant neoplasm of lesser curvature of stomach, unspecified: Secondary | ICD-10-CM | POA: Diagnosis not present

## 2014-11-01 DIAGNOSIS — E780 Pure hypercholesterolemia: Secondary | ICD-10-CM | POA: Diagnosis not present

## 2014-11-01 DIAGNOSIS — F419 Anxiety disorder, unspecified: Secondary | ICD-10-CM | POA: Diagnosis not present

## 2014-11-01 DIAGNOSIS — K219 Gastro-esophageal reflux disease without esophagitis: Secondary | ICD-10-CM | POA: Diagnosis not present

## 2014-11-01 DIAGNOSIS — C169 Malignant neoplasm of stomach, unspecified: Secondary | ICD-10-CM | POA: Diagnosis not present

## 2014-11-01 DIAGNOSIS — I10 Essential (primary) hypertension: Secondary | ICD-10-CM | POA: Diagnosis not present

## 2014-11-01 DIAGNOSIS — Z510 Encounter for antineoplastic radiation therapy: Secondary | ICD-10-CM | POA: Diagnosis not present

## 2014-11-01 DIAGNOSIS — R531 Weakness: Secondary | ICD-10-CM | POA: Diagnosis not present

## 2014-11-01 NOTE — Telephone Encounter (Signed)
Received message from pt's daughter Tammy Hughes stating "she wants to stop radiation"  Returned call to Ms. Landis/unable to leave message to return call.  Pt scheduled noted today for Rad Onc apt @  11:15.

## 2014-11-01 NOTE — Telephone Encounter (Signed)
Pt's daughter returned call; states "she does want to come in to be looked at; she feels like she's choking when she swallows."  Advised pt can be seen @ 3pm with Selena Lesser, NP in [facility].  Rip Harbour verbalized understanding.

## 2014-11-01 NOTE — Telephone Encounter (Signed)
Rip Harbour calls back and reports that Pt did receive radiation tx today and after tx pt told daughter "I don't want to go back"  Rip Harbour states she is c/o of her throat hurting and gave her "Gatorade to drink and she threw it all up"  Offered pt to be seen in [facility] today at 3pm re: symptoms and Rip Harbour stated she will call back and confirm if she wants to come.  Ned Card, NP made aware of situation/Dr. Benay Spice out of office today.

## 2014-11-02 ENCOUNTER — Ambulatory Visit: Payer: Medicare Other

## 2014-11-02 ENCOUNTER — Encounter: Payer: Self-pay | Admitting: Nurse Practitioner

## 2014-11-02 ENCOUNTER — Telehealth: Payer: Self-pay | Admitting: *Deleted

## 2014-11-02 ENCOUNTER — Ambulatory Visit
Admission: RE | Admit: 2014-11-02 | Discharge: 2014-11-02 | Disposition: A | Discharge status: Home / Self Care | Payer: Medicare Other | Source: Ambulatory Visit | Attending: Radiation Oncology | Admitting: Radiation Oncology

## 2014-11-02 ENCOUNTER — Telehealth: Payer: Self-pay | Admitting: Radiation Oncology

## 2014-11-02 DIAGNOSIS — R531 Weakness: Secondary | ICD-10-CM | POA: Insufficient documentation

## 2014-11-02 NOTE — Assessment & Plan Note (Signed)
Patient continues to complain of some mucositis/esophagitis; as well as some dysphagia.  Patient states that she is occasionally choking on both her liquids and food.  Patient states that she has had throat dilatation in the past; may very well need a repeat dilatation soon.  Advised patient to continue with only small amounts of oral intake; with a plan to obtain most of her hydration and nutrition via her PEG tube.  Advised patient and family to go ahead and arrange for a repeat throat dilatation per her GI physician within the next 3-4 weeks if possible.

## 2014-11-02 NOTE — Telephone Encounter (Signed)
Spoke to pt daughter. Confirmed appt with Dr. Benay Spice on 4/25. Strongly encouraged pt daughter to have her come to this appt to discuss treatment plan and future treatments. Pt has decided to refuse the current treatment plan as her quality of life is nonexistent. She feels worse than she has ever felt and would like to stop chemo and radiation. Pt is not feeling any better today. She is complaining of body aches including her hips and legs. They are pushing fluids through her tube and letting her get rest.  Daughter confirms they will come to see Dr Benay Spice.

## 2014-11-02 NOTE — Telephone Encounter (Signed)
Received call from Jethro Bolus, RN for Tammy Lesser, NP. She reports this patient no longer wants to pursue radiation therapy. Informed Emily, RT on linac 1 of this finding. Also, informed Dr. Lisbeth Renshaw of these findings via Epic and Outlook inbasket.

## 2014-11-02 NOTE — Assessment & Plan Note (Signed)
5-FU infusional pump dosage was reduced due to skin toxicity/rash.  Patient last received chemotherapy on 10/24/2014.  Does appear the patient scattered rash is slowly resolving.  All areas are healing/scabbing.  No evidence of active infection.

## 2014-11-02 NOTE — Assessment & Plan Note (Signed)
Patient completed cycle 2 of her 5-FU infusional pump on 10/24/2014.  The chemotherapy therapy dose had been reduced due to skin toxicity/rash.  Has been withholding all further chemotherapy due to skin toxicity and continued poor tolerability/weakness.  Patient has been undergoing daily radiation treatments as well.  She was scheduled to complete radiation on 11/23/2014.  However, patient presented to the [place] this afternoon stating that she prefers to withhold any further radiation treatments whatsoever; so she can recover.  Patient and family has multiple questions regarding any future restaging scans and treatment plan.  Advised patient and family would cancel radiation treatments scheduled for this week.  Would also review all with Dr. Benay Spice; and be back in contact with them regarding next appointment and future plans.

## 2014-11-02 NOTE — Assessment & Plan Note (Signed)
Patient reports that the combination of chemotherapy and radiation treatments have made her increasingly weak; and she prefers to withhold any further cancer treatment for the time being and so she may recover.  On exam-patient does appear fairly weak and slightly dehydrated.  Advised patient to try just sips of liquid and soft foods orally; and to plan to receive most of her hydration and nutrition the her PEG tube for at least the next several weeks.  Offered IV fluid rehydration today; but family prefers to hydrate with water boluses per the PEG tube

## 2014-11-02 NOTE — Progress Notes (Signed)
SYMPTOM MANAGEMENT CLINIC   HPI: Tammy Hughes 81 y.o. female diagnosed with gastric cancer.  Currently undergoing infusional 5-FU pump and concurrent radiation therapy.  Patient completed cycle 2 of her 5-FU infusional pump on 10/24/2014.  The chemotherapy therapy dose had been reduced due to skin toxicity/rash.  Has been withholding all further chemotherapy due to skin toxicity and continued poor tolerability/weakness.  Patient has been undergoing daily radiation treatments as well.  She was scheduled to complete radiation on 11/23/2014.  However, patient presented to the [facility] this afternoon stating that she prefers to withhold any further radiation treatments whatsoever; so she can recover.  Patient and family has multiple questions regarding any future restaging scans and treatment plan.  HPI  ROS  Past Medical History  Diagnosis Date  . HYPERCHOLESTEROLEMIA 02/01/2008  . ANXIETY 03/08/2008  . PERIPHERAL NEUROPATHY 02/23/2007  . HYPERTENSION 03/08/2008  . GERD 02/23/2007  . DIVERTICULOSIS, COLON 03/08/2008  . OSTEOARTHRITIS 02/23/2007  . OSTEOPOROSIS 02/23/2007  . Urolithiasis   . Allergy     SEASONAL  . Cataract     REMOVED BILATERAL  . Depression   . Stomach cancer 07/20/14    invasive adenocarcinoma w/signet rings features  . DIABETES MELLITUS, TYPE I 02/23/2007  . DVT (deep venous thrombosis)     Past Surgical History  Procedure Laterality Date  . Cholecystectomy  1995  . Esophagogastroduodenoscopy  06/08/2002  . Gastrectomy N/A 07/20/2014    Procedure: PROXIMAL GASTRECTOMY;  Surgeon: Stark Klein, MD;  Location: [place];  Service: General;  Laterality: N/A;  . Laparoscopy N/A 07/20/2014    Procedure: LAPAROSCOPY DIAGNOSTIC;  Surgeon: Stark Klein, MD;  Location: [place];  Service: General;  Laterality: N/A;  . Gastrojejunostomy N/A 07/20/2014    Procedure: GASTROJEJUNOSTOMY;  Surgeon: Stark Klein, MD;  Location: [place];  Service: General;  Laterality: N/A;    has  TRICHOMONAL URETHRITIS; DIABETES MELLITUS, TYPE I; HYPERCHOLESTEROLEMIA; LEUKOPENIA, MILD; ANXIETY; PERIPHERAL NEUROPATHY; Essential hypertension; Non-allergic rhinitis; GERD; DIVERTICULOSIS, COLON; RECTAL BLEEDING; OSTEOARTHRITIS; BACK PAIN, LUMBAR; OSTEOPOROSIS; Encounter for long-term (current) use of other medications; Carpal tunnel syndrome of left wrist; NASH (nonalcoholic steatohepatitis); Knee pain, bilateral; Cough; Postinflammatory pulmonary fibrosis; Pain in joint, lower leg; Dysphagia; Gastric cancer-s/p gastrectomy 07/20/14; NSVT post-op; Elevated troponin post op - 0.6; Aspirin allergy; Type 2 diabetes mellitus; Debility; HCAP (healthcare-associated pneumonia); Type 2 diabetes mellitus with peripheral neuropathy; Abdominal pain; Cancer associated pain; Rash; Long term current use of anticoagulant; Leg edema, right; Blister of foot; Left arm pain; DVT (deep venous thrombosis); and Weakness on her problem list.    is allergic to pirfenidone and aspirin.    Medication List       This list is accurate as of: 11/01/14 11:59 PM.  Always use your most recent med list.               clopidogrel 75 MG tablet  Commonly known as:  PLAVIX  Take 1 tablet (75 mg total) by mouth daily.     enoxaparin 100 MG/ML injection  Commonly known as:  LOVENOX  Inject 1 mL (100 mg total) into the skin daily.     esomeprazole 40 MG capsule  Commonly known as:  NEXIUM  Take 1 capsule (40 mg total) by mouth daily at 12 noon.     glucose blood test strip  Commonly known as:  BAYER CONTOUR TEST  Use to check blood sugar 2/day. Dx code E11.9     hyaluronate sodium Gel  Apply 1 application  topically daily.     HYDROcodone-acetaminophen 7.5-325 mg/15 ml solution  Commonly known as:  HYCET  Take 15 mLs by mouth every 4 (four) hours as needed for moderate pain.     insulin aspart protamine - aspart (70-30) 100 UNIT/ML FlexPen  Commonly known as:  NOVOLOG MIX 70/30 FLEXPEN  35 units each morning and 55  units at bedtime.     Insulin Pen Needle 31G X 8 MM Misc  USE AS DIRECTED WITH INSULIN TWICE DAILY Dx: 250.01     LORazepam 1 MG tablet  Commonly known as:  ATIVAN  Take 1 mg by mouth at bedtime. As needed for sleep     magic mouthwash w/lidocaine Soln  Take 5 mLs by mouth 4 (four) times daily as needed for mouth pain.     metoprolol tartrate 25 MG tablet  Commonly known as:  LOPRESSOR  Take 1 tablet (25 mg total) by mouth 2 (two) times daily.     mirtazapine 15 MG tablet  Commonly known as:  REMERON  Take 1 tablet (15 mg total) by mouth at bedtime.     promethazine 12.5 MG tablet  Commonly known as:  PHENERGAN  Take 1 tablet (12.5 mg total) by mouth every 4 (four) hours as needed for nausea or vomiting.     rosuvastatin 10 MG tablet  Commonly known as:  CRESTOR  Take 1 tablet (10 mg total) by mouth daily.         PHYSICAL EXAMINATION  Oncology Vitals 10/31/2014 10/28/2014 10/28/2014 10/27/2014 10/27/2014 10/27/2014 10/27/2014  Height 152 cm - - - - 152 cm -  Weight 64.365 kg - 66.134 kg - - 66.225 kg -  Weight (lbs) 141 lbs 14 oz - 145 lbs 13 oz - - 146 lbs -  BMI (kg/m2) 27.71 kg/m2 - 28.47 kg/m2 - - 28.51 kg/m2 -  Temp 97.7 98.3 98.3 98.7 98.1 97.8 -  Pulse 74 61 74 72 65 62 65  Resp '17 16 16 16 16 16 ' -  SpO2 99 96 96 96 99 99 97  BSA (m2) 1.65 m2 - 1.67 m2 - - 1.67 m2 -   BP Readings from Last 3 Encounters:  10/31/14 126/60  10/28/14 117/52  10/25/14 139/61    Physical Exam  Constitutional: She is oriented to person, place, and time. Vital signs are normal. She appears unhealthy.  Patient appears fatigued, weak, and chronically ill.  HENT:  Head: Normocephalic and atraumatic.  Mouth/Throat: Oropharynx is clear and moist.  Eyes: Conjunctivae and EOM are normal. Pupils are equal, round, and reactive to light. Right eye exhibits no discharge. Left eye exhibits no discharge. No scleral icterus.  Neck: Normal range of motion.  Cardiovascular: Regular rhythm.     Pulmonary/Chest: Effort normal. No respiratory distress.  Abdominal:  Left abdominal wall with PEG tube intact.  Musculoskeletal:  Right lower extremity with edema; but no calf tenderness, no erythema, no warmth, and no red streaks.  Right upper arm with PICC intact with healing bruise.  Mild tenderness to right upper arm above elbow.  No evidence of edema, erythema, warmth, or red streaks.  Neurological: She is alert and oriented to person, place, and time.  Patient in a wheelchair during the exam.  Skin: Rash noted.  Patient with scattered pink/red rash/lesion to her scalp, face, neck, anterior chest wall, upper back, and bilateral arms.  No evidence of infection to rash.  Psychiatric: Affect normal.  Nursing note and vitals reviewed.  is  LABORATORY  DATA:. Appointment on 10/31/2014  Component Date Value Ref Range Status  . WBC 10/31/2014 6.2  3.9 - 10.3 10e3/uL Final  . NEUT# 10/31/2014 4.8  1.5 - 6.5 10e3/uL Final  . HGB 10/31/2014 11.3* 11.6 - 15.9 g/dL Final  . HCT 10/31/2014 35.8  34.8 - 46.6 % Final  . Platelets 10/31/2014 283  145 - 400 10e3/uL Final  . MCV 10/31/2014 88.7  79.5 - 101.0 fL Final  . MCH 10/31/2014 28.1  25.1 - 34.0 pg Final  . MCHC 10/31/2014 31.6  31.5 - 36.0 g/dL Final  . RBC 10/31/2014 4.03  3.70 - 5.45 10e6/uL Final  . RDW 10/31/2014 23.1* 11.2 - 14.5 % Final  . lymph# 10/31/2014 0.6* 0.9 - 3.3 10e3/uL Final  . MONO# 10/31/2014 0.7  0.1 - 0.9 10e3/uL Final  . Eosinophils Absolute 10/31/2014 0.1  0.0 - 0.5 10e3/uL Final  . Basophils Absolute 10/31/2014 0.0  0.0 - 0.1 10e3/uL Final  . NEUT% 10/31/2014 76.8  38.4 - 76.8 % Final  . LYMPH% 10/31/2014 10.2* 14.0 - 49.7 % Final  . MONO% 10/31/2014 10.7  0.0 - 14.0 % Final  . EOS% 10/31/2014 1.9  0.0 - 7.0 % Final  . BASO% 10/31/2014 0.4  0.0 - 2.0 % Final  . Sodium 10/31/2014 142  136 - 145 mEq/L Final  . Potassium 10/31/2014 3.9  3.5 - 5.1 mEq/L Final  . Chloride 10/31/2014 105  98 - 109 mEq/L Final  .  CO2 10/31/2014 25  22 - 29 mEq/L Final  . Glucose 10/31/2014 163* 70 - 140 mg/dl Final  . BUN 10/31/2014 21.5  7.0 - 26.0 mg/dL Final  . Creatinine 10/31/2014 0.8  0.6 - 1.1 mg/dL Final  . Total Bilirubin 10/31/2014 0.29  0.20 - 1.20 mg/dL Final  . Alkaline Phosphatase 10/31/2014 99  40 - 150 U/L Final  . AST 10/31/2014 20  5 - 34 U/L Final  . ALT 10/31/2014 15  0 - 55 U/L Final  . Total Protein 10/31/2014 7.3  6.4 - 8.3 g/dL Final  . Albumin 10/31/2014 3.1* 3.5 - 5.0 g/dL Final  . Calcium 10/31/2014 9.8  8.4 - 10.4 mg/dL Final  . Anion Gap 10/31/2014 12* 3 - 11 mEq/L Final  . EGFR 10/31/2014 85* >90 ml/min/1.73 m2 Final   eGFR is calculated using the CKD-EPI Creatinine Equation (2009)    RADIOGRAPHIC STUDIES: No results found.  ASSESSMENT/PLAN:    Dysphagia Patient continues to complain of some mucositis/esophagitis; as well as some dysphagia.  Patient states that she is occasionally choking on both her liquids and food.  Patient states that she has had throat dilatation in the past; may very well need a repeat dilatation soon.  Advised patient to continue with only small amounts of oral intake; with a plan to obtain most of her hydration and nutrition via her PEG tube.  Advised patient and family to go ahead and arrange for a repeat throat dilatation per her GI physician within the next 3-4 weeks if possible.   Gastric cancer-s/p gastrectomy 07/20/14 Patient completed cycle 2 of her 5-FU infusional pump on 10/24/2014.  The chemotherapy therapy dose had been reduced due to skin toxicity/rash.  Has been withholding all further chemotherapy due to skin toxicity and continued poor tolerability/weakness.  Patient has been undergoing daily radiation treatments as well.  She was scheduled to complete radiation on 11/23/2014.  However, patient presented to the [facility] this afternoon stating that she prefers to withhold any further radiation  treatments whatsoever; so she can  recover.  Patient and family has multiple questions regarding any future restaging scans and treatment plan.  Advised patient and family would cancel radiation treatments scheduled for this week.  Would also review all with Dr. Benay Spice; and be back in contact with them regarding next appointment and future plans.   Rash 5-FU infusional pump dosage was reduced due to skin toxicity/rash.  Patient last received chemotherapy on 10/24/2014.  Does appear the patient scattered rash is slowly resolving.  All areas are healing/scabbing.  No evidence of active infection.   Weakness Patient reports that the combination of chemotherapy and radiation treatments have made her increasingly weak; and she prefers to withhold any further cancer treatment for the time being and so she may recover.  On exam-patient does appear fairly weak and slightly dehydrated.  Advised patient to try just sips of liquid and soft foods orally; and to plan to receive most of her hydration and nutrition the her PEG tube for at least the next several weeks.  Offered IV fluid rehydration today; but family prefers to hydrate with water boluses per the PEG tube    Patient stated understanding of all instructions; and was in agreement with this plan of care. The patient knows to call the clinic with any problems, questions or concerns.   Review/collaboration with Dr. Benay Spice regarding all aspects of patient's visit today.   Total time spent with patient was 40 minutes;  with greater than 75 percent of that time spent in face to face counseling regarding patient's symptoms,  and coordination of care and follow up.  Disclaimer: This note was dictated with voice recognition software. Similar sounding words can inadvertently be transcribed and may not be corrected upon review.   Drue Second, NP 11/02/2014

## 2014-11-03 ENCOUNTER — Ambulatory Visit: Payer: Medicare Other

## 2014-11-03 ENCOUNTER — Other Ambulatory Visit: Payer: Self-pay | Admitting: Oncology

## 2014-11-03 DIAGNOSIS — C169 Malignant neoplasm of stomach, unspecified: Secondary | ICD-10-CM

## 2014-11-04 ENCOUNTER — Ambulatory Visit: Payer: Medicare Other | Admitting: Radiation Oncology

## 2014-11-04 ENCOUNTER — Ambulatory Visit: Payer: Medicare Other

## 2014-11-04 ENCOUNTER — Ambulatory Visit
Admission: RE | Admit: 2014-11-04 | Discharge: 2014-11-04 | Disposition: A | Discharge status: Home / Self Care | Payer: Medicare Other | Source: Ambulatory Visit | Attending: Radiation Oncology | Admitting: Radiation Oncology

## 2014-11-07 ENCOUNTER — Other Ambulatory Visit (HOSPITAL_BASED_OUTPATIENT_CLINIC_OR_DEPARTMENT_OTHER): Payer: Medicare Other

## 2014-11-07 ENCOUNTER — Ambulatory Visit: Payer: Medicare Other

## 2014-11-07 ENCOUNTER — Ambulatory Visit (HOSPITAL_BASED_OUTPATIENT_CLINIC_OR_DEPARTMENT_OTHER): Payer: Medicare Other | Admitting: Oncology

## 2014-11-07 ENCOUNTER — Telehealth: Payer: Self-pay | Admitting: Oncology

## 2014-11-07 ENCOUNTER — Encounter: Payer: Self-pay | Admitting: *Deleted

## 2014-11-07 VITALS — BP 122/56 | HR 66 | Temp 97.6°F | Resp 17 | Ht 60.0 in | Wt 143.4 lb

## 2014-11-07 DIAGNOSIS — C169 Malignant neoplasm of stomach, unspecified: Secondary | ICD-10-CM | POA: Diagnosis not present

## 2014-11-07 DIAGNOSIS — I82621 Acute embolism and thrombosis of deep veins of right upper extremity: Secondary | ICD-10-CM

## 2014-11-07 DIAGNOSIS — R1310 Dysphagia, unspecified: Secondary | ICD-10-CM

## 2014-11-07 DIAGNOSIS — F419 Anxiety disorder, unspecified: Secondary | ICD-10-CM | POA: Diagnosis not present

## 2014-11-07 DIAGNOSIS — E1143 Type 2 diabetes mellitus with diabetic autonomic (poly)neuropathy: Secondary | ICD-10-CM | POA: Diagnosis not present

## 2014-11-07 DIAGNOSIS — M79601 Pain in right arm: Secondary | ICD-10-CM | POA: Diagnosis not present

## 2014-11-07 DIAGNOSIS — R21 Rash and other nonspecific skin eruption: Secondary | ICD-10-CM | POA: Diagnosis not present

## 2014-11-07 DIAGNOSIS — C165 Malignant neoplasm of lesser curvature of stomach, unspecified: Secondary | ICD-10-CM

## 2014-11-07 DIAGNOSIS — F329 Major depressive disorder, single episode, unspecified: Secondary | ICD-10-CM | POA: Diagnosis not present

## 2014-11-07 DIAGNOSIS — I10 Essential (primary) hypertension: Secondary | ICD-10-CM | POA: Diagnosis not present

## 2014-11-07 DIAGNOSIS — I472 Ventricular tachycardia: Secondary | ICD-10-CM | POA: Diagnosis not present

## 2014-11-07 LAB — COMPREHENSIVE METABOLIC PANEL (CC13)
ALBUMIN: 2.8 g/dL — AB (ref 3.5–5.0)
ALT: 15 U/L (ref 0–55)
ANION GAP: 11 meq/L (ref 3–11)
AST: 23 U/L (ref 5–34)
Alkaline Phosphatase: 99 U/L (ref 40–150)
BUN: 15.8 mg/dL (ref 7.0–26.0)
CHLORIDE: 102 meq/L (ref 98–109)
CO2: 26 mEq/L (ref 22–29)
Calcium: 9.8 mg/dL (ref 8.4–10.4)
Creatinine: 0.8 mg/dL (ref 0.6–1.1)
EGFR: 80 mL/min/{1.73_m2} — ABNORMAL LOW (ref >90)
GLUCOSE: 189 mg/dL — AB (ref 70–140)
Potassium: 4.6 mEq/L (ref 3.5–5.1)
Sodium: 139 mEq/L (ref 136–145)
Total Bilirubin: 0.28 mg/dL (ref 0.20–1.20)
Total Protein: 7.1 g/dL (ref 6.4–8.3)

## 2014-11-07 LAB — CBC WITH DIFFERENTIAL/PLATELET
BASO%: 0.4 % (ref 0.0–2.0)
BASOS ABS: 0 10*3/uL (ref 0.0–0.1)
EOS ABS: 0.2 10*3/uL (ref 0.0–0.5)
EOS%: 3.7 % (ref 0.0–7.0)
HCT: 34.3 % — ABNORMAL LOW (ref 34.8–46.6)
HEMOGLOBIN: 11 g/dL — AB (ref 11.6–15.9)
LYMPH#: 0.4 10*3/uL — AB (ref 0.9–3.3)
LYMPH%: 6.7 % — AB (ref 14.0–49.7)
MCH: 28.7 pg (ref 25.1–34.0)
MCHC: 32.1 g/dL (ref 31.5–36.0)
MCV: 89.6 fL (ref 79.5–101.0)
MONO#: 0.8 10*3/uL (ref 0.1–0.9)
MONO%: 13.3 % (ref 0.0–14.0)
NEUT#: 4.3 10*3/uL (ref 1.5–6.5)
NEUT%: 75.9 % (ref 38.4–76.8)
PLATELETS: 242 10*3/uL (ref 145–400)
RBC: 3.83 10*6/uL (ref 3.70–5.45)
RDW: 21.5 % — ABNORMAL HIGH (ref 11.2–14.5)
WBC: 5.7 10*3/uL (ref 3.9–10.3)

## 2014-11-07 NOTE — Progress Notes (Signed)
  [facility] OFFICE PROGRESS NOTE   Diagnosis: Gastric cancer  INTERVAL HISTORY:   Tammy Hughes returns as scheduled. She decided to discontinue radiation. She has mild discomfort in the right arm. She complains of nausea and pain after swallowing. She is using the feeding tube for nutrition. She is ambulatory in the home.  Objective:  Vital signs in last 24 hours:  Blood pressure 122/56, pulse 66, temperature 97.6 F (36.4 C), temperature source Oral, resp. rate 17, height 5' (1.524 m), SpO2 98 %.    HEENT: Oropharynx without thrush or ulcers Resp: Inspiratory rales at the lower posterior chest bilaterally, no respiratory distress Cardio: Regular rate and rhythm GI: No hepatosplenomegaly, nontender, left upper quadrant feeding tube site without evidence of infection Vascular: No leg edema  Skin: Resolving 5-fluorouracil rash over the trunk and extremities. Stable hematomas at the right heel and several toes. Decreased hand and foot erythema.   Portacath/PICC-without erythema  Lab Results:  Lab Results  Component Value Date   WBC 5.7 11/07/2014   HGB 11.0* 11/07/2014   HCT 34.3* 11/07/2014   MCV 89.6 11/07/2014   PLT 242 11/07/2014   NEUTROABS 4.3 11/07/2014    Medications: I have reviewed the patient's current medications.  Assessment/Plan: 1. Gastric cancer, status post an endoscopic biopsy of a lesser curvature mass on 05/31/2014 confirming adenocarcinoma  Staging CTs of the chest and abdomen on 06/08/2014 revealed no evidence of metastatic disease  Proximal gastrectomy and feeding jejunostomy 07/20/2014, pT2,pN1  Adjuvant weekly 5-Fu/leucovorin 09/14/2014, 09/21/2014, 09/28/2014 and 10/05/2014  Initiation of radiation and infusional 5-FU 10/17/2014  5-fluorouracil pump discontinued on 10/27/2014 due to toxicity.  Radiation discontinued 11/02/2014  2. Pulmonary fibrosis 3. Esophageal stricture, status post a dilatation procedure  05/31/2014 4. Diabetes 5. Postoperative ventricle tachycardia, NSTEMI January 2016 6. Hospital-acquired pneumonia January 2016 7. CT abdomen/pelvis 09/02/2014 with postsurgical changes. Focal fluid collection in the midline abutting the distal greater curvature of the stomach measuring 2.2 x 2.9 x 3.4 cm. Mild stranding in the adjacent fat. 8. PICC placement 10/17/2014 9. Rash. Most likely related to the 5-fluorouracil. Improved. 10. Right upper extremity DVT 10/25/2014. On Xarelto. 11. Hospitalization 10/27/2014 through 10/28/2014 nausea/vomiting and left arm discomfort. Noted to have skin toxicity from the 5-fluorouracil. The 5-fluorouracil was discontinued.   Disposition:  Tammy Hughes continues to have a poor performance status. The skin changes appear to be resolving. She has decided against further chemotherapy and radiation. I referred her to Dr. Deatra Ina to evaluate the swallowing difficulty. She will continue using the jejunostomy for feedings and hydration.  She will return for an office visit on 11/17/2014. We will see her sooner as needed. We discontinued the right PICC today.  I discussed the indication for restaging CTs with her family. I do not recommend CT scans unless there is clinical evidence of progressive disease. I suspect her current symptoms are related to the gastrectomy and adjuvant therapy.  [name], MD  11/07/2014  10:40 AM

## 2014-11-07 NOTE — Telephone Encounter (Signed)
Pt confirmed labs/ov per 04/25 POF, gave pt AVS and calendar..... KJ, s/w Amy confirming referral in workqueue for Dr. Deatra Ina, they will contact pt with D/T pt confirmed.Marland KitchenMarland KitchenMarland Kitchen

## 2014-11-07 NOTE — Patient Instructions (Signed)
PICC Removal °A peripherally inserted central catheter (PICC) is a long, thin, flexible tube that a health care provider can insert into a vein in your upper arm. It is a type of IV. Having a PICC in place gives health care providers quick access to your veins. It is a good way to distribute medicines and fluids quickly throughout your body. °LET YOUR HEALTH CARE PROVIDER KNOW ABOUT: °· Any allergies you have. °· All medicines you are taking, including vitamins, herbs, eye drops, creams, and over-the-counter medicines. °· Previous problems you or members of your family have had with the use of anesthetics. °· Any blood disorders you have. °· Previous surgeries you have had. °· Medical conditions you have. °RISKS AND COMPLICATIONS °Generally, this is a safe procedure. However, as with any procedure, problems can occur. Possible problems include: °· Bleeding. °· Infection. °BEFORE THE PROCEDURE °You need an order from your health care provider to have your PICC removed. Only a health care provider trained in PICC removal should take it out. You may have your PICC removed in the hospital or in an outpatient setting. °PROCEDURE °Having a PICC removed is usually painless. Removal of the tape that holds the PICC in place may be the most uncomfortable part. Do not take out the PICC yourself. Only a trained clinical professional, such as a PICC nurse, should remove it. If your health care provider thinks your PICC is infected, the tip may be sent to the lab for testing. °After taking out your PICC, your health care provider may:  °· Hold gentle pressure on the exit site. °· Apply some antibiotic ointment. °· Place a small bandage over the insertion site. °AFTER THE PROCEDURE °You should be able to remove the bandage after 24 hours. Follow all your health care provider's instructions.  °· Keep the insertion site clean by washing it gently with soap and water. °· Do not pick or remove a scab. °· Avoid strenuous physical  activity for a day or two. °· Let your health care provider know if you develop redness, soreness, bleeding, swelling, or drainage from the insertion site. °· Let your health care provider know if you develop chills or fever. °Document Released: 12/19/2009 Document Revised: 07/06/2013 Document Reviewed: 04/23/2013 °Exit[employer]® Patient Information ©2015 [employer]. This information is not intended to replace advice given to you by your health care provider. Make sure you discuss any questions you have with your health care provider. ° °

## 2014-11-07 NOTE — Progress Notes (Signed)
35 cm PICC catheter removed from patient right AC. Patient in supine position and observed for 30 minutes. Patient was instructed to remove bandage in 24 hours. Patient vital signs taken and discharged in stable condition.

## 2014-11-08 ENCOUNTER — Telehealth: Payer: Self-pay | Admitting: Endocrinology

## 2014-11-08 ENCOUNTER — Ambulatory Visit: Payer: Medicare Other

## 2014-11-08 DIAGNOSIS — I472 Ventricular tachycardia: Secondary | ICD-10-CM | POA: Diagnosis not present

## 2014-11-08 DIAGNOSIS — F419 Anxiety disorder, unspecified: Secondary | ICD-10-CM | POA: Diagnosis not present

## 2014-11-08 DIAGNOSIS — F329 Major depressive disorder, single episode, unspecified: Secondary | ICD-10-CM | POA: Diagnosis not present

## 2014-11-08 DIAGNOSIS — I10 Essential (primary) hypertension: Secondary | ICD-10-CM | POA: Diagnosis not present

## 2014-11-08 DIAGNOSIS — C169 Malignant neoplasm of stomach, unspecified: Secondary | ICD-10-CM | POA: Diagnosis not present

## 2014-11-08 DIAGNOSIS — E1143 Type 2 diabetes mellitus with diabetic autonomic (poly)neuropathy: Secondary | ICD-10-CM | POA: Diagnosis not present

## 2014-11-08 NOTE — Telephone Encounter (Signed)
See note below and please advise, Thanks! 

## 2014-11-08 NOTE — Telephone Encounter (Signed)
i would be ok with being attending dr, or hospice dr would be ok

## 2014-11-08 NOTE — Telephone Encounter (Signed)
Hospice called and needs to know if Dr. Loanne Drilling has agreed to be attending for pt of hospice and if so they needs orders to admit pt to hospice and standing orders  Call hospice at [phone]  And please fax pt medical records to hospice at [phone]

## 2014-11-09 ENCOUNTER — Telehealth: Payer: Self-pay | Admitting: *Deleted

## 2014-11-09 ENCOUNTER — Encounter: Payer: Self-pay | Admitting: Endocrinology

## 2014-11-09 ENCOUNTER — Ambulatory Visit (INDEPENDENT_AMBULATORY_CARE_PROVIDER_SITE_OTHER): Payer: Medicare Other | Admitting: Endocrinology

## 2014-11-09 ENCOUNTER — Ambulatory Visit: Payer: Medicare Other

## 2014-11-09 ENCOUNTER — Encounter: Payer: Self-pay | Admitting: Radiation Oncology

## 2014-11-09 VITALS — BP 126/80 | HR 66 | Ht 60.0 in | Wt 142.0 lb

## 2014-11-09 DIAGNOSIS — E119 Type 2 diabetes mellitus without complications: Secondary | ICD-10-CM

## 2014-11-09 NOTE — Patient Instructions (Addendum)
Please continue the same medications.   check your blood sugar twice a day.  vary the time of day when you check, between before the 3 meals, and at bedtime.  also check if you have symptoms of your blood sugar being too high or too low.  please keep a record of the readings and bring it to your next appointment here.  You can write it on any piece of paper.  please call us sooner if your blood sugar goes below 70, or if you have a lot of readings over 200.   Please call when you need a refill of your pain medication, or for any other need. Please further discuss where we go from here in terms of the cancer, when you see Dr Benay Spice next week.  I would be happy to see you back here whenever you want.

## 2014-11-09 NOTE — Telephone Encounter (Signed)
I contacted Levada Dy with Hospice and advised of note below. She stated it would probably be better for the hospice MD to be the attending physician. She stated if this was a problem with the hospice Dr's she would notify our office.

## 2014-11-09 NOTE — Telephone Encounter (Signed)
[place] from [facility] called; reports that Dr. Loanne Drilling made Hospice referral and Okay from [facility] that Hospice MD manage pt.  Requesting Okay from Dr. Benay Spice.  Per Dr. Benay Spice notified Shirlean Mylar for Hospice MD to manage pt; records sent to [facility] @ fax # [phone]

## 2014-11-09 NOTE — Progress Notes (Signed)
Subjective:    Patient ID: Tammy Hughes, female    DOB: [birth year], 81 y.o.   MRN: [id number]  HPI Pt returns for f/u of diabetes mellitus: DM type: Insulin-requiring type 2 Dx'ed: 0865 Complications: polyneuropathy.   Therapy: insulin since 2005 GDM: never DKA: never Severe hypoglycemia: never Pancreatitis: never Other: she chose bid premixed insulin; She takes tube-feeding overnight. Interval history: hx is from pt and husband, who says chemo does not seem to affect cbg's.  no cbg record, but states cbg's are well-controlled.  There is no trend throughout the day.  She denies hypoglycemia.  She is eating a small amount by mouth (appetite is improved since she finished chemo and XRT. She is holding off on chemotx and XRT, at least for now.  Husband says pt feels better in general.  abd pain: pain persists, but is improved.   Past Medical History  Diagnosis Date  . HYPERCHOLESTEROLEMIA 02/01/2008  . ANXIETY 03/08/2008  . PERIPHERAL NEUROPATHY 02/23/2007  . HYPERTENSION 03/08/2008  . GERD 02/23/2007  . DIVERTICULOSIS, COLON 03/08/2008  . OSTEOARTHRITIS 02/23/2007  . OSTEOPOROSIS 02/23/2007  . Urolithiasis   . Allergy     SEASONAL  . Cataract     REMOVED BILATERAL  . Depression   . Stomach cancer 07/20/14    invasive adenocarcinoma w/signet rings features  . DIABETES MELLITUS, TYPE I 02/23/2007  . DVT (deep venous thrombosis)     Past Surgical History  Procedure Laterality Date  . Cholecystectomy  1995  . Esophagogastroduodenoscopy  06/08/2002  . Gastrectomy N/A 07/20/2014    Procedure: PROXIMAL GASTRECTOMY;  Surgeon: Stark Klein, MD;  Location: [facility];  Service: General;  Laterality: N/A;  . Laparoscopy N/A 07/20/2014    Procedure: LAPAROSCOPY DIAGNOSTIC;  Surgeon: Stark Klein, MD;  Location: [facility];  Service: General;  Laterality: N/A;  . Gastrojejunostomy N/A 07/20/2014    Procedure: GASTROJEJUNOSTOMY;  Surgeon: Stark Klein, MD;  Location: [facility];  Service: General;  Laterality:  N/A;    History   Social History  . Marital Status: Married    Spouse Name: N/A  . Number of Children: N/A  . Years of Education: N/A   Occupational History  . Retired    Social History Main Topics  . Smoking status: Never Smoker   . Smokeless tobacco: Never Used  . Alcohol Use: No  . Drug Use: No  . Sexual Activity: Not on file   Other Topics Concern  . Not on file   Social History Narrative   Dentist          Current Outpatient Prescriptions on File Prior to Visit  Medication Sig Dispense Refill  . Alum & Mag Hydroxide-Simeth (MAGIC MOUTHWASH W/LIDOCAINE) SOLN Take 5 mLs by mouth 4 (four) times daily as needed for mouth pain. 120 mL 1  . clopidogrel (PLAVIX) 75 MG tablet Take 1 tablet (75 mg total) by mouth daily. 90 tablet 1  . esomeprazole (NEXIUM) 40 MG capsule Take 1 capsule (40 mg total) by mouth daily at 12 noon. 90 capsule 2  . glucose blood (BAYER CONTOUR TEST) test strip Use to check blood sugar 2/day. Dx code E11.9 100 each 2  . hyaluronate sodium (RADIAPLEXRX) GEL Apply 1 application topically daily.    Marland Kitchen HYDROcodone-acetaminophen (HYCET) 7.5-325 mg/15 ml solution Take 15 mLs by mouth every 4 (four) hours as needed for moderate pain. 473 mL 0  . insulin aspart protamine - aspart (NOVOLOG MIX 70/30 FLEXPEN) (70-30) 100 UNIT/ML FlexPen 35  units each morning and 55 units at bedtime. (Patient taking differently: Inject 40 Units into the skin 2 (two) times daily with a meal. ) 30 mL 11  . Insulin Pen Needle 31G X 8 MM MISC USE AS DIRECTED WITH INSULIN TWICE DAILY Dx: 250.01 200 each prn  . LORazepam (ATIVAN) 1 MG tablet Take 1 mg by mouth at bedtime. As needed for sleep    . metoprolol tartrate (LOPRESSOR) 25 MG tablet Take 1 tablet (25 mg total) by mouth 2 (two) times daily. 180 tablet 1  . mirtazapine (REMERON) 15 MG tablet Take 1 tablet (15 mg total) by mouth at bedtime as needed. 30 tablet 0  . promethazine (PHENERGAN) 12.5 MG tablet Take 1  tablet (12.5 mg total) by mouth every 4 (four) hours as needed for nausea or vomiting. 50 tablet 2  . rivaroxaban (XARELTO) 20 MG TABS tablet Take 20 mg by mouth daily with supper.    . rosuvastatin (CRESTOR) 10 MG tablet Take 1 tablet (10 mg total) by mouth daily. 90 tablet 3   No current facility-administered medications on file prior to visit.    Allergies  Allergen Reactions  . Pirfenidone Diarrhea and Nausea And Vomiting  . Aspirin     rash    Family History  Problem Relation Age of Onset  . Cancer Father     Prostate Cancer  . Cancer Sister     Breast Cancer  . Diabetes Sister   . Diabetes Sister     BP 126/80 mmHg  Pulse 66  Ht 5' (1.524 m)  Wt 142 lb (64.411 kg)  BMI 27.73 kg/m2  SpO2 94%    Review of Systems She has lost a few more lbs.  Nausea is unchanged.    Objective:   Physical Exam Vital signs: see vs page.  Gen: elderly, frail, no distress.  In wheelchair.   ABDOMEN: abdomen is soft, nontender.  no hepatosplenomegaly.  not distended.  no hernia.  Jejunostomy is present. Old healed surgical scar is noted        Assessment & Plan:  Adenocarcinoma of the stomach: i told pt and her husband i am happy to be attending dr for her hospice care. DM: well-controlled.  Please continue the same insulin abd pain: well-controlled.  Please continue the same medication for pain and nausea.  Patient is advised the following: Patient Instructions  Please continue the same medications.   check your blood sugar twice a day.  vary the time of day when you check, between before the 3 meals, and at bedtime.  also check if you have symptoms of your blood sugar being too high or too low.  please keep a record of the readings and bring it to your next appointment here.  You can write it on any piece of paper.  please call us sooner if your blood sugar goes below 70, or if you have a lot of readings over 200.   Please call when you need a refill of your pain medication, or  for any other need. Please further discuss where we go from here in terms of the cancer, when you see Dr Benay Spice next week.  I would be happy to see you back here whenever you want.

## 2014-11-10 ENCOUNTER — Ambulatory Visit: Payer: Medicare Other

## 2014-11-11 ENCOUNTER — Ambulatory Visit: Payer: Medicare Other

## 2014-11-14 ENCOUNTER — Ambulatory Visit: Payer: Medicare Other

## 2014-11-14 NOTE — Progress Notes (Signed)
  Radiation Oncology         [phone] ________________________________  Name: Tammy Hughes MRN: [id number]  Date: 11/09/2014  DOB: 1932-12-10  End of Treatment Note  Diagnosis:   Gastric cancer     Indication for treatment::  curative       Radiation treatment dates:   10/17/14 - 11/01/14  Site/dose:   The patient was planned to receive 45 Gy initially to the abdomen in 25 Fractions using a 3D conformal technique.  Narrative: The patient tolerated radiation treatment. However, she began tolerating chemo-radiation poorly and was hospitalized. She ultimately decided to forego further XRT and her treatment was discontinued after she received 19.8 Gy.  Plan: The patient has completed radiation treatment. The patient will return to radiation oncology clinic for routine followup in one month. I advised the patient to call or return sooner if they have any questions or concerns related to their recovery or treatment. ________________________________  Jodelle Gross, M.D., Ph.D.

## 2014-11-14 NOTE — Progress Notes (Signed)
  Radiation Oncology         [phone] ________________________________  Name: Tammy Hughes MRN: [id number]  Date: 10/05/2014  DOB: 08-09-1932  SIMULATION AND TREATMENT PLANNING NOTE  DIAGNOSIS:  Gastric cancer  Site:  abdomen  NARRATIVE:  The patient was brought to the [facility].  Identity was confirmed.  All relevant records and images related to the planned course of therapy were reviewed.   Written consent to proceed with treatment was confirmed which was freely given after reviewing the details related to the planned course of therapy had been reviewed with the patient.  Then, the patient was set-up in a stable reproducible  supine position for radiation therapy.  CT images were obtained.  Surface markings were placed.    Medically necessary complex treatment device(s) for immobilization:  Vac-lock bag.   The CT images were loaded into the planning software.  Then the target and avoidance structures were contoured.  Treatment planning then occurred.  The radiation prescription was entered and confirmed.  A total of 3 complex treatment devices were fabricated which relate to the designed radiation treatment fields. Each of these customized fields/ complex treatment devices will be used on a daily basis during the radiation course. I have requested : 3D Simulation  I have requested a DVH of the following structures: target colume, cord, kidneys, liver.   PLAN:  The patient will receive 45 Gy in 25 fractions initially. A 5.4 Gy boost may be given subsequently.   Special treatment procedure The patient will also receive concurrent chemotherapy during the treatment. The patient may therefore experience increased toxicity or side effects and the patient will be monitored for such problems. This may require extra lab work as necessary. This therefore constitutes a special treatment procedure..  ________________________________   Jodelle Gross, MD, PhD

## 2014-11-14 NOTE — Addendum Note (Signed)
Encounter addended by: Kyung Rudd, MD on: 11/14/2014  3:21 PM<BR>     Documentation filed: Notes Section, Visit Diagnoses

## 2014-11-15 ENCOUNTER — Ambulatory Visit: Payer: Medicare Other

## 2014-11-15 ENCOUNTER — Telehealth: Payer: Self-pay | Admitting: *Deleted

## 2014-11-15 ENCOUNTER — Telehealth: Payer: Self-pay | Admitting: Endocrinology

## 2014-11-15 NOTE — Telephone Encounter (Signed)
HOSPICE NURSE CALLING TO SEE IF DR.Copemish. THERE IS NO INDICATION IN PT.'S CHART OF A REFERRAL TO HOSPICE. NOTIFIED HOSPICE NURSE. SHE WILL RETURN A CALL TO THIS OFFICE.

## 2014-11-15 NOTE — Telephone Encounter (Signed)
Robin from hospice called following up on patient medical records please advise [phone]

## 2014-11-15 NOTE — Telephone Encounter (Signed)
Medical records were faxed on 11/08/2014. Last office note faxed again on 11/15/2014. Left voicemail with Levada Dy advising.

## 2014-11-16 ENCOUNTER — Ambulatory Visit: Payer: Medicare Other

## 2014-11-16 DIAGNOSIS — C169 Malignant neoplasm of stomach, unspecified: Secondary | ICD-10-CM | POA: Diagnosis not present

## 2014-11-16 DIAGNOSIS — I472 Ventricular tachycardia: Secondary | ICD-10-CM | POA: Diagnosis not present

## 2014-11-16 DIAGNOSIS — F419 Anxiety disorder, unspecified: Secondary | ICD-10-CM | POA: Diagnosis not present

## 2014-11-16 DIAGNOSIS — I10 Essential (primary) hypertension: Secondary | ICD-10-CM | POA: Diagnosis not present

## 2014-11-16 DIAGNOSIS — F329 Major depressive disorder, single episode, unspecified: Secondary | ICD-10-CM | POA: Diagnosis not present

## 2014-11-16 DIAGNOSIS — E1143 Type 2 diabetes mellitus with diabetic autonomic (poly)neuropathy: Secondary | ICD-10-CM | POA: Diagnosis not present

## 2014-11-17 ENCOUNTER — Telehealth: Payer: Self-pay | Admitting: Oncology

## 2014-11-17 ENCOUNTER — Ambulatory Visit (HOSPITAL_BASED_OUTPATIENT_CLINIC_OR_DEPARTMENT_OTHER): Payer: Medicare Other | Admitting: Nurse Practitioner

## 2014-11-17 ENCOUNTER — Ambulatory Visit: Payer: Medicare Other

## 2014-11-17 VITALS — BP 133/61 | HR 69 | Temp 97.5°F | Resp 18 | Ht 60.0 in | Wt 144.9 lb

## 2014-11-17 DIAGNOSIS — R1010 Upper abdominal pain, unspecified: Secondary | ICD-10-CM

## 2014-11-17 DIAGNOSIS — E1143 Type 2 diabetes mellitus with diabetic autonomic (poly)neuropathy: Secondary | ICD-10-CM | POA: Diagnosis not present

## 2014-11-17 DIAGNOSIS — F419 Anxiety disorder, unspecified: Secondary | ICD-10-CM | POA: Diagnosis not present

## 2014-11-17 DIAGNOSIS — Z931 Gastrostomy status: Secondary | ICD-10-CM | POA: Diagnosis not present

## 2014-11-17 DIAGNOSIS — I10 Essential (primary) hypertension: Secondary | ICD-10-CM | POA: Diagnosis not present

## 2014-11-17 DIAGNOSIS — Z86718 Personal history of other venous thrombosis and embolism: Secondary | ICD-10-CM | POA: Diagnosis not present

## 2014-11-17 DIAGNOSIS — L270 Generalized skin eruption due to drugs and medicaments taken internally: Secondary | ICD-10-CM

## 2014-11-17 DIAGNOSIS — R1310 Dysphagia, unspecified: Secondary | ICD-10-CM

## 2014-11-17 DIAGNOSIS — C169 Malignant neoplasm of stomach, unspecified: Secondary | ICD-10-CM

## 2014-11-17 DIAGNOSIS — I472 Ventricular tachycardia: Secondary | ICD-10-CM | POA: Diagnosis not present

## 2014-11-17 DIAGNOSIS — F329 Major depressive disorder, single episode, unspecified: Secondary | ICD-10-CM | POA: Diagnosis not present

## 2014-11-17 NOTE — Progress Notes (Addendum)
[facility] OFFICE PROGRESS NOTE   Diagnosis:  Gastric cancer  INTERVAL HISTORY:   Tammy Hughes returns as scheduled. She overall is feeling better. Her skin is healing. She continues to have mild dysphagia and odynophagia. She has appointment to see Dr. Deatra Ina in the near future. The dysphagia occurs with liquids and solids. She continues tube feedings. She and her family report a 2 month history of intermittent pain at the left mid to low back. The pain does not occur on a daily basis and last occurred 3 or 4 days ago. She takes pain medication with good relief. They report a scan showed a "hematoma" in the area of pain. She denies fever. She at times takes the pain medication to help her sleep.   Objective:  Vital signs in last 24 hours:  Blood pressure 133/61, pulse 69, temperature 97.5 F (36.4 C), temperature source Oral, resp. rate 18, height 5' (1.524 m), weight 144 lb 14.4 oz (65.726 kg), SpO2 99 %.    HEENT: No ulcers. White coating over tongue. Resp: Lungs with rales at the lower lung fields bilaterally. No respiratory distress. Cardio: Regular rate and rhythm. GI: Abdomen soft and nontender. No hepatomegaly. Left abdomen feeding tube site without signs of infection. Vascular: Trace edema right ankle. Soft and nontender.  Skin: Healing rash over the trunk and extremities. Hands and feet without erythema. Stable hematomas at the heels and involving the great toes.  Musculoskeletal: She indicates the pain is occurring at the left lateral abdomen/low chest. This area is nontender. No palpable mass.   Lab Results:  Lab Results  Component Value Date   WBC 5.7 11/07/2014   HGB 11.0* 11/07/2014   HCT 34.3* 11/07/2014   MCV 89.6 11/07/2014   PLT 242 11/07/2014   NEUTROABS 4.3 11/07/2014    Imaging:  No results found.  Medications: I have reviewed the patient's current medications.  Assessment/Plan: 1. Gastric cancer, status post an endoscopic biopsy of a  lesser curvature mass on 05/31/2014 confirming adenocarcinoma  Staging CTs of the chest and abdomen on 06/08/2014 revealed no evidence of metastatic disease  Proximal gastrectomy and feeding jejunostomy 07/20/2014, pT2,pN1  Adjuvant weekly 5-Fu/leucovorin 09/14/2014, 09/21/2014, 09/28/2014 and 10/05/2014  Initiation of radiation and infusional 5-FU 10/17/2014  5-fluorouracil pump discontinued on 10/27/2014 due to toxicity.  Radiation discontinued 11/02/2014  2. Pulmonary fibrosis 3. Esophageal stricture, status post a dilatation procedure 05/31/2014 4. Diabetes 5. Postoperative ventricle tachycardia, NSTEMI January 2016 6. Hospital-acquired pneumonia January 2016 7. CT abdomen/pelvis 09/02/2014 with postsurgical changes. Focal fluid collection in the midline abutting the distal greater curvature of the stomach measuring 2.2 x 2.9 x 3.4 cm. Mild stranding in the adjacent fat. 8. PICC placement 10/17/2014 9. Rash. Most likely related to the 5-fluorouracil. Improved. 10. Right upper extremity DVT 10/25/2014. On Xarelto. Three-month course planned. 11. Hospitalization 10/27/2014 through 10/28/2014 nausea/vomiting and left arm discomfort. Noted to have skin toxicity from the 5-fluorouracil. The 5-fluorouracil was discontinued.   Disposition: Tammy Hughes appears improved. The skin toxicity is resolving. She will follow-up with Dr. Deatra Ina regarding the dysphagia/odynophagia. She will continue the tube feedings for now and follow-up with Drs. Deatra Ina and Quamba regarding the timing of removal of the feeding tube.  She has been experiencing intermittent pain at the left side for the past 2 months. If the pain worsens or occurs more frequently we will proceed with CT evaluation.  We reviewed the indications for restaging CT scans with [name] and her family. They understand  Dr. Benay Spice does not recommend CT scans unless there is clinical evidence of progressive disease.  We recommended she  try over-the-counter Benadryl for sleep rather than taking the pain medication.  She will return for a follow-up visit in 2 months. She will contact the office in the interim as outlined above or with any other problems.  Patient seen with Dr. Benay Spice. 25 minutes were spent face-to-face at today's visit with the majority of that time involved in counseling/coordination of care.    Orion, Vandervort ANP/GNP-BC   11/17/2014  11:33 AM   This was a shared visit with Ned Card. Tammy Hughes was interviewed and examined. She is recovering from the toxicity of chemotherapy. We discussed the prognosis and surveillance plan with her family. She has intermittent discomfort at the left upper lateral abdomen of unclear etiology. This is most likely not related to recurrent gastric cancer. Her family will contact us if the pain becomes consistent or more intense.  She will continue anticoagulation therapy and return for an office visit in 2 months.  Julieanne Manson, M.D.

## 2014-11-17 NOTE — Telephone Encounter (Signed)
Gave and printed appt sched and avs for pt for July  °

## 2014-11-18 ENCOUNTER — Telehealth: Payer: Self-pay

## 2014-11-18 ENCOUNTER — Ambulatory Visit: Payer: Medicare Other

## 2014-11-18 DIAGNOSIS — I472 Ventricular tachycardia: Secondary | ICD-10-CM | POA: Diagnosis not present

## 2014-11-18 DIAGNOSIS — F419 Anxiety disorder, unspecified: Secondary | ICD-10-CM | POA: Diagnosis not present

## 2014-11-18 DIAGNOSIS — F329 Major depressive disorder, single episode, unspecified: Secondary | ICD-10-CM | POA: Diagnosis not present

## 2014-11-18 DIAGNOSIS — E1143 Type 2 diabetes mellitus with diabetic autonomic (poly)neuropathy: Secondary | ICD-10-CM | POA: Diagnosis not present

## 2014-11-18 DIAGNOSIS — C169 Malignant neoplasm of stomach, unspecified: Secondary | ICD-10-CM | POA: Diagnosis not present

## 2014-11-18 DIAGNOSIS — I10 Essential (primary) hypertension: Secondary | ICD-10-CM | POA: Diagnosis not present

## 2014-11-18 NOTE — Telephone Encounter (Signed)
ok 

## 2014-11-18 NOTE — Telephone Encounter (Signed)
Angie with [facility] notified via voicemail.

## 2014-11-18 NOTE — Telephone Encounter (Signed)
Tammy Hughes from [facility] called requesting verbal orders for heel protectors and Physical Therapy evaluation. Pt has declined to have hospice come in and is requesting home health to start coming back to her home.  Please advise, Thanks!

## 2014-11-21 ENCOUNTER — Ambulatory Visit: Payer: Medicare Other

## 2014-11-21 DIAGNOSIS — I472 Ventricular tachycardia: Secondary | ICD-10-CM | POA: Diagnosis not present

## 2014-11-21 DIAGNOSIS — I10 Essential (primary) hypertension: Secondary | ICD-10-CM | POA: Diagnosis not present

## 2014-11-21 DIAGNOSIS — F419 Anxiety disorder, unspecified: Secondary | ICD-10-CM | POA: Diagnosis not present

## 2014-11-21 DIAGNOSIS — E1143 Type 2 diabetes mellitus with diabetic autonomic (poly)neuropathy: Secondary | ICD-10-CM | POA: Diagnosis not present

## 2014-11-21 DIAGNOSIS — C169 Malignant neoplasm of stomach, unspecified: Secondary | ICD-10-CM | POA: Diagnosis not present

## 2014-11-21 DIAGNOSIS — F329 Major depressive disorder, single episode, unspecified: Secondary | ICD-10-CM | POA: Diagnosis not present

## 2014-11-22 ENCOUNTER — Ambulatory Visit: Payer: Medicare Other

## 2014-11-23 ENCOUNTER — Ambulatory Visit: Payer: Medicare Other

## 2014-11-23 DIAGNOSIS — E1143 Type 2 diabetes mellitus with diabetic autonomic (poly)neuropathy: Secondary | ICD-10-CM | POA: Diagnosis not present

## 2014-11-23 DIAGNOSIS — F329 Major depressive disorder, single episode, unspecified: Secondary | ICD-10-CM | POA: Diagnosis not present

## 2014-11-23 DIAGNOSIS — C169 Malignant neoplasm of stomach, unspecified: Secondary | ICD-10-CM | POA: Diagnosis not present

## 2014-11-23 DIAGNOSIS — I10 Essential (primary) hypertension: Secondary | ICD-10-CM | POA: Diagnosis not present

## 2014-11-23 DIAGNOSIS — I472 Ventricular tachycardia: Secondary | ICD-10-CM | POA: Diagnosis not present

## 2014-11-23 DIAGNOSIS — F419 Anxiety disorder, unspecified: Secondary | ICD-10-CM | POA: Diagnosis not present

## 2014-11-24 ENCOUNTER — Ambulatory Visit: Payer: Medicare Other

## 2014-11-24 DIAGNOSIS — C169 Malignant neoplasm of stomach, unspecified: Secondary | ICD-10-CM | POA: Diagnosis not present

## 2014-11-24 DIAGNOSIS — F329 Major depressive disorder, single episode, unspecified: Secondary | ICD-10-CM | POA: Diagnosis not present

## 2014-11-24 DIAGNOSIS — I472 Ventricular tachycardia: Secondary | ICD-10-CM | POA: Diagnosis not present

## 2014-11-24 DIAGNOSIS — E1143 Type 2 diabetes mellitus with diabetic autonomic (poly)neuropathy: Secondary | ICD-10-CM | POA: Diagnosis not present

## 2014-11-24 DIAGNOSIS — F419 Anxiety disorder, unspecified: Secondary | ICD-10-CM | POA: Diagnosis not present

## 2014-11-24 DIAGNOSIS — I10 Essential (primary) hypertension: Secondary | ICD-10-CM | POA: Diagnosis not present

## 2014-11-25 ENCOUNTER — Ambulatory Visit: Payer: Medicare Other

## 2014-11-25 DIAGNOSIS — F419 Anxiety disorder, unspecified: Secondary | ICD-10-CM | POA: Diagnosis not present

## 2014-11-25 DIAGNOSIS — C169 Malignant neoplasm of stomach, unspecified: Secondary | ICD-10-CM | POA: Diagnosis not present

## 2014-11-25 DIAGNOSIS — I10 Essential (primary) hypertension: Secondary | ICD-10-CM | POA: Diagnosis not present

## 2014-11-25 DIAGNOSIS — E1143 Type 2 diabetes mellitus with diabetic autonomic (poly)neuropathy: Secondary | ICD-10-CM | POA: Diagnosis not present

## 2014-11-25 DIAGNOSIS — F329 Major depressive disorder, single episode, unspecified: Secondary | ICD-10-CM | POA: Diagnosis not present

## 2014-11-25 DIAGNOSIS — I472 Ventricular tachycardia: Secondary | ICD-10-CM | POA: Diagnosis not present

## 2014-11-28 ENCOUNTER — Telehealth: Payer: Self-pay

## 2014-11-28 DIAGNOSIS — C169 Malignant neoplasm of stomach, unspecified: Secondary | ICD-10-CM | POA: Diagnosis not present

## 2014-11-28 DIAGNOSIS — Z934 Other artificial openings of gastrointestinal tract status: Secondary | ICD-10-CM | POA: Diagnosis not present

## 2014-11-28 NOTE — Telephone Encounter (Signed)
Rip Harbour - daughter - states mother c/o blisters on her arms and legs that showed up over the weekend. Arms itch but not legs.  She wants her to be seen. Pt unable to come in at 245 today.  lvm for pt to call us back to find out more details of the blisters.

## 2014-11-28 NOTE — Telephone Encounter (Signed)
S/w daughter that attempted to call pt 3 x with no answer. Instructed her to call back tomorrow to find out if our symptom management clinic was available. Told Rip Harbour I was going to ask about redness, swelling, oozing, pain, fever. She said it was not oozing nor painful but could not answer any of the other questions. Rip Harbour will go see pt tomorrow and get more information.

## 2014-11-29 DIAGNOSIS — I10 Essential (primary) hypertension: Secondary | ICD-10-CM | POA: Diagnosis not present

## 2014-11-29 DIAGNOSIS — E1143 Type 2 diabetes mellitus with diabetic autonomic (poly)neuropathy: Secondary | ICD-10-CM | POA: Diagnosis not present

## 2014-11-29 DIAGNOSIS — F419 Anxiety disorder, unspecified: Secondary | ICD-10-CM | POA: Diagnosis not present

## 2014-11-29 DIAGNOSIS — C169 Malignant neoplasm of stomach, unspecified: Secondary | ICD-10-CM | POA: Diagnosis not present

## 2014-11-29 DIAGNOSIS — F329 Major depressive disorder, single episode, unspecified: Secondary | ICD-10-CM | POA: Diagnosis not present

## 2014-11-29 DIAGNOSIS — I472 Ventricular tachycardia: Secondary | ICD-10-CM | POA: Diagnosis not present

## 2014-11-30 ENCOUNTER — Ambulatory Visit (INDEPENDENT_AMBULATORY_CARE_PROVIDER_SITE_OTHER): Payer: Medicare Other | Admitting: Nurse Practitioner

## 2014-11-30 ENCOUNTER — Encounter: Payer: Self-pay | Admitting: Nurse Practitioner

## 2014-11-30 VITALS — BP 130/70 | HR 72 | Ht 60.0 in | Wt 145.4 lb

## 2014-11-30 DIAGNOSIS — R1310 Dysphagia, unspecified: Secondary | ICD-10-CM | POA: Diagnosis not present

## 2014-11-30 NOTE — Patient Instructions (Signed)
You have been scheduled for a Barium Esophogram at [facility] (1st floor of the hospital) on 12/06/14 at 1030 am. Please arrive 15 minutes prior to your appointment for registration. Make certain not to have anything to eat or drink 3 hours prior to your test. If you need to reschedule for any reason, please contact radiology at [phone] to do so. __________________________________________________________________ A barium swallow is an examination that concentrates on views of the esophagus. This tends to be a double contrast exam (barium and two liquids which, when combined, create a gas to distend the wall of the oesophagus) or single contrast (non-ionic iodine based). The study is usually tailored to your symptoms so a good history is essential. Attention is paid during the study to the form, structure and configuration of the esophagus, looking for functional disorders (such as aspiration, dysphagia, achalasia, motility and reflux) EXAMINATION You may be asked to change into a gown, depending on the type of swallow being performed. A radiologist and radiographer will perform the procedure. The radiologist will advise you of the type of contrast selected for your procedure and direct you during the exam. You will be asked to stand, sit or lie in several different positions and to hold a small amount of fluid in your mouth before being asked to swallow while the imaging is performed .In some instances you may be asked to swallow barium coated marshmallows to assess the motility of a solid food bolus. The exam can be recorded as a digital or video fluoroscopy procedure. POST PROCEDURE It will take 1-2 days for the barium to pass through your system. To facilitate this, it is important, unless otherwise directed, to increase your fluids for the next 24-48hrs and to resume your normal diet.  This test typically takes about 30 minutes to perform.

## 2014-12-01 DIAGNOSIS — R079 Chest pain, unspecified: Secondary | ICD-10-CM | POA: Diagnosis not present

## 2014-12-01 DIAGNOSIS — R0789 Other chest pain: Secondary | ICD-10-CM | POA: Diagnosis not present

## 2014-12-01 DIAGNOSIS — R918 Other nonspecific abnormal finding of lung field: Secondary | ICD-10-CM | POA: Diagnosis not present

## 2014-12-02 ENCOUNTER — Encounter: Payer: Self-pay | Admitting: Nurse Practitioner

## 2014-12-02 NOTE — Progress Notes (Signed)
     History of Present Illness:  Patient is a 81 year old female known to Dr. Deatra Ina. I saw her in October with dysphasia, subsequent EGD revealed an early esophageal stricture which was dilated. There was also a gastric fundus mass which in fact turned out to be adenocarcinoma. CT negative for metastatic disease. Patient underwent a proximal gastrectomy and feeding jejunostomy in January of this year. She took a few doses of adjuvant chemotherapy and radiation but then discontinued treatment secondary to toxic effects.  Patient comes in today for evaluation of dysphagia. She gets enteral feedings at night but was able to tolerate solids up until recently. No significant pain with swallowing. No nausea.    Current Medications, Allergies, Past Medical History, Past Surgical History, Family History and Social History were reviewed in [employer] record.  Physical Exam: General: Pleasant, well developed , white female in no acute distress Head: Normocephalic and atraumatic Eyes:  sclerae anicteric, conjunctiva pink  Ears: Normal auditory acuity Lungs: Clear throughout to auscultation Heart: Regular rate and rhythm Abdomen: Soft, non distended, non-tender. No masses, no hepatomegaly. Normal bowel sounds Musculoskeletal: Symmetrical with no gross deformities  Extremities: No edema  Neurological: Alert oriented x 4, grossly nonfocal Psychological:  Alert and cooperative. Normal mood and affect  Assessment and Recommendations:   81 year old female with worsening chronic dysphagia. Patient was diagnosed with cancer of the gastric fundus November 2015. She is status post proximal gastrectomy with feeding jejunostomy placement in January. She has discontinued chemotherapy and radiation secondary to side effects. Now with worsening dysphagia. Rule out stricture. She got very limited chemoXRT so doubt radiation esophagitis, viral or candida esophagitis. For further evaluation of  solid food dysphagia will first obtain a barium swallow with tablet. Patient is on Plavix which would need to be held should EGD become necessary. Will call her with barium swallow results. We discussed the importance of eating slowly, taking small bites of food, chewing well and consuming adequate amounts of fluid in between bites to avoid food impaction.

## 2014-12-05 ENCOUNTER — Encounter: Payer: Self-pay | Admitting: Internal Medicine

## 2014-12-05 ENCOUNTER — Ambulatory Visit (INDEPENDENT_AMBULATORY_CARE_PROVIDER_SITE_OTHER): Payer: Medicare Other | Admitting: Internal Medicine

## 2014-12-05 VITALS — BP 140/68 | HR 58 | Ht 60.0 in | Wt 144.4 lb

## 2014-12-05 DIAGNOSIS — I472 Ventricular tachycardia: Secondary | ICD-10-CM

## 2014-12-05 DIAGNOSIS — C169 Malignant neoplasm of stomach, unspecified: Secondary | ICD-10-CM | POA: Diagnosis not present

## 2014-12-05 DIAGNOSIS — F419 Anxiety disorder, unspecified: Secondary | ICD-10-CM | POA: Diagnosis not present

## 2014-12-05 DIAGNOSIS — I4729 Other ventricular tachycardia: Secondary | ICD-10-CM

## 2014-12-05 DIAGNOSIS — R072 Precordial pain: Secondary | ICD-10-CM

## 2014-12-05 DIAGNOSIS — F411 Generalized anxiety disorder: Secondary | ICD-10-CM

## 2014-12-05 DIAGNOSIS — E119 Type 2 diabetes mellitus without complications: Secondary | ICD-10-CM

## 2014-12-05 DIAGNOSIS — F329 Major depressive disorder, single episode, unspecified: Secondary | ICD-10-CM | POA: Diagnosis not present

## 2014-12-05 DIAGNOSIS — R079 Chest pain, unspecified: Secondary | ICD-10-CM

## 2014-12-05 DIAGNOSIS — E785 Hyperlipidemia, unspecified: Secondary | ICD-10-CM

## 2014-12-05 DIAGNOSIS — E1143 Type 2 diabetes mellitus with diabetic autonomic (poly)neuropathy: Secondary | ICD-10-CM | POA: Diagnosis not present

## 2014-12-05 DIAGNOSIS — I10 Essential (primary) hypertension: Secondary | ICD-10-CM | POA: Diagnosis not present

## 2014-12-05 NOTE — Progress Notes (Signed)
OFFICE NOTE  Chief Complaint:  Hospital follow-up  Primary Care Physician: Renato Shin, MD  HPI:  Tammy Hughes is a 81 y.o. female with a past medical history significant for DM2, dyslipidemia, and gastric cancer - no known history of CAD. She is POD #3 today and getting tube feeds, without resumption of bowel function as of yet. She was noted overnight to have an asymptomatic 18 beat run of NSVT, which self-terminated. She denies any recent chest pain or worsening shortness of breath. Her last EKG in the system was in 10/2013, which showed NSR with borderline LVH. She underwent a repeat echocardiogram in the hospital which showed a preserved EF of 55-60% with no wall motion abnormalities. I recommended starting her on Lopressor 25 mg twice daily which is taking without difficulty. She's been asymptomatic from a cardiac standpoint. She did undergo partial gastrectomy for her cancer on July 20 2014. She tolerated surgery well and did not have any preoperative stress testing. Was felt that she might benefit from stress testing as an outpatient however since she did so well for his surgery it's not clear that that is necessary. She may or may not be undergoing chemotherapy as there was evidence that the cancer has spread to lymph nodes. She is scheduled to see Dr. Benay Spice next week.  I saw Mr. Jourdan back in the office today. She was recently seen in the hospital and [facility] for chest pain. She had not previously had chest pain. An echocardiogram was performed for nonsustained VT which demonstrated normal systolic function. She does have risk factors however for coronary disease. She ruled out for MI and [place] and no further testing was undertaken. That was last week which she was in the emergency room. She also developed an upper extremity DVT related to her chemotherapy port. She was treated with 3 weeks of Xarelto and taken off of Plavix and then placed back on plavix.  PMHx:  Past  Medical History  Diagnosis Date  . HYPERCHOLESTEROLEMIA 02/01/2008  . ANXIETY 03/08/2008  . PERIPHERAL NEUROPATHY 02/23/2007  . HYPERTENSION 03/08/2008  . GERD 02/23/2007  . DIVERTICULOSIS, COLON 03/08/2008  . OSTEOARTHRITIS 02/23/2007  . OSTEOPOROSIS 02/23/2007  . Urolithiasis   . Allergy     SEASONAL  . Cataract     REMOVED BILATERAL  . Depression   . Stomach cancer 07/20/14    invasive adenocarcinoma w/signet rings features  . DIABETES MELLITUS, TYPE I 02/23/2007  . DVT (deep venous thrombosis)     Past Surgical History  Procedure Laterality Date  . Cholecystectomy  1995  . Esophagogastroduodenoscopy  06/08/2002  . Gastrectomy N/A 07/20/2014    Procedure: PROXIMAL GASTRECTOMY;  Surgeon: Stark Klein, MD;  Location: [place];  Service: General;  Laterality: N/A;  . Laparoscopy N/A 07/20/2014    Procedure: LAPAROSCOPY DIAGNOSTIC;  Surgeon: Stark Klein, MD;  Location: [place];  Service: General;  Laterality: N/A;  . Gastrojejunostomy N/A 07/20/2014    Procedure: GASTROJEJUNOSTOMY;  Surgeon: Stark Klein, MD;  [facility];  Service: General;  Laterality: N/A;    FAMHx:  Family History  Problem Relation Age of Onset  . Cancer Father     Prostate Cancer  . Cancer Sister     Breast Cancer  . Diabetes Sister   . Diabetes Sister     SOCHx:   reports that she has never smoked. She has never used smokeless tobacco. She reports that she does not drink alcohol or use illicit drugs.  ALLERGIES:  Allergies  Allergen Reactions  . Pirfenidone Diarrhea and Nausea And Vomiting  . Aspirin     rash    ROS: A comprehensive review of systems was negative except for: Cardiovascular: positive for chest pain  HOME MEDS: Current Outpatient Prescriptions  Medication Sig Dispense Refill  . Alum & Mag Hydroxide-Simeth (MAGIC MOUTHWASH W/LIDOCAINE) SOLN Take 5 mLs by mouth 4 (four) times daily as needed for mouth pain. 120 mL 1  . clopidogrel (PLAVIX) 75 MG tablet Take 1 tablet (75 mg total) by  mouth daily. 90 tablet 1  . esomeprazole (NEXIUM) 40 MG capsule Take 1 capsule (40 mg total) by mouth daily at 12 noon. 90 capsule 2  . glucose blood (BAYER CONTOUR TEST) test strip Use to check blood sugar 2/day. Dx code E11.9 100 each 2  . hyaluronate sodium (RADIAPLEXRX) GEL Apply 1 application topically daily.    Marland Kitchen HYDROcodone-acetaminophen (HYCET) 7.5-325 mg/15 ml solution Take 15 mLs by mouth every 4 (four) hours as needed for moderate pain. 473 mL 0  . insulin aspart protamine - aspart (NOVOLOG MIX 70/30 FLEXPEN) (70-30) 100 UNIT/ML FlexPen 35 units each morning and 55 units at bedtime. (Patient taking differently: Inject 40 Units into the skin 2 (two) times daily with a meal. ) 30 mL 11  . Insulin Pen Needle 31G X 8 MM MISC USE AS DIRECTED WITH INSULIN TWICE DAILY Dx: 250.01 200 each prn  . LORazepam (ATIVAN) 1 MG tablet Take 1 mg by mouth at bedtime. As needed for sleep    . metoprolol tartrate (LOPRESSOR) 25 MG tablet Take 1 tablet (25 mg total) by mouth 2 (two) times daily. 180 tablet 1  . mirtazapine (REMERON) 15 MG tablet Take 1 tablet (15 mg total) by mouth at bedtime as needed. 30 tablet 0  . promethazine (PHENERGAN) 12.5 MG tablet Take 1 tablet (12.5 mg total) by mouth every 4 (four) hours as needed for nausea or vomiting. 50 tablet 2  . rosuvastatin (CRESTOR) 10 MG tablet Take 1 tablet (10 mg total) by mouth daily. 90 tablet 3   No current facility-administered medications for this visit.    LABS/IMAGING: No results found for this or any previous visit (from the past 48 hour(s)). No results found.  VITALS: BP 140/68 mmHg  Pulse 58  Ht 5' (1.524 m)  Wt 144 lb 6.4 oz (65.499 kg)  BMI 28.20 kg/m2  EXAM: General appearance: alert and no distress Neck: no carotid bruit and no JVD Lungs: clear to auscultation bilaterally Heart: regular rate and rhythm, S1, S2 normal, no murmur, click, rub or gallop Abdomen: Large anterior midline scar-healing Extremities: extremities  normal, atraumatic, no cyanosis or edema Pulses: 2+ and symmetric Skin: Skin color, texture, turgor normal. No rashes or lesions Neurologic: Grossly normal Psych: Pleasant  EKG: Deferred  ASSESSMENT: 1. Chest pain 2. Recent upper extremity DVT associated with a port-status post 3 weeks of Xarelto, now back on Plavix 3. NSVT postop 4. Normal LV function on echo  PLAN: 1.   Mrs. Ahlquist recently had upper extremity DVT and was on Xarelto for 3 weeks, now she is back on Plavix. She recently was seen in the ER at [facility] for chest pain. She does have multiple coronary risk factors but ruled out for MI. EF was normal recently on echo but she did not undergo stress testing due to lack of symptoms. Since she is now having chest pain I would recommend a Lexiscan nuclear stress test. We'll contact her with those results  and if abnormal she may need further evaluation.  Pixie Casino, MD, [facility] Attending Cardiologist [place] 12/05/2014, 1:17 PM

## 2014-12-05 NOTE — Progress Notes (Signed)
Reviewed and agree with management. [place] Serpe D. Aedin Jeansonne, M.D., FACG  

## 2014-12-05 NOTE — Patient Instructions (Signed)
Your physician has requested that you have a lexiscan myoview. For further information please visit [email or web address]. Please follow instruction sheet, as given.  Your physician recommends that you schedule a follow-up appointment after your test.  

## 2014-12-06 ENCOUNTER — Ambulatory Visit (HOSPITAL_COMMUNITY)
Admission: RE | Admit: 2014-12-06 | Discharge: 2014-12-06 | Disposition: A | Discharge status: Home / Self Care | Payer: Medicare Other | Source: Ambulatory Visit | Attending: Nurse Practitioner | Admitting: Nurse Practitioner

## 2014-12-06 DIAGNOSIS — K219 Gastro-esophageal reflux disease without esophagitis: Secondary | ICD-10-CM | POA: Insufficient documentation

## 2014-12-06 DIAGNOSIS — R1310 Dysphagia, unspecified: Secondary | ICD-10-CM

## 2014-12-07 ENCOUNTER — Telehealth: Payer: Self-pay

## 2014-12-07 DIAGNOSIS — F329 Major depressive disorder, single episode, unspecified: Secondary | ICD-10-CM | POA: Diagnosis not present

## 2014-12-07 DIAGNOSIS — I472 Ventricular tachycardia: Secondary | ICD-10-CM | POA: Diagnosis not present

## 2014-12-07 DIAGNOSIS — I10 Essential (primary) hypertension: Secondary | ICD-10-CM | POA: Diagnosis not present

## 2014-12-07 DIAGNOSIS — C169 Malignant neoplasm of stomach, unspecified: Secondary | ICD-10-CM | POA: Diagnosis not present

## 2014-12-07 DIAGNOSIS — F419 Anxiety disorder, unspecified: Secondary | ICD-10-CM | POA: Diagnosis not present

## 2014-12-07 DIAGNOSIS — E1143 Type 2 diabetes mellitus with diabetic autonomic (poly)neuropathy: Secondary | ICD-10-CM | POA: Diagnosis not present

## 2014-12-07 NOTE — Telephone Encounter (Signed)
Physical therapist Junie Panning notified.

## 2014-12-07 NOTE — Telephone Encounter (Signed)
Junie Panning, Physical Therapist with [facility] called requesting verbal orders to extend PT until the patient is more stable.  Please advise if ok to proceed. Thanks!

## 2014-12-07 NOTE — Telephone Encounter (Signed)
ok 

## 2014-12-09 DIAGNOSIS — F329 Major depressive disorder, single episode, unspecified: Secondary | ICD-10-CM | POA: Diagnosis not present

## 2014-12-09 DIAGNOSIS — M810 Age-related osteoporosis without current pathological fracture: Secondary | ICD-10-CM | POA: Diagnosis not present

## 2014-12-09 DIAGNOSIS — I472 Ventricular tachycardia: Secondary | ICD-10-CM | POA: Diagnosis not present

## 2014-12-09 DIAGNOSIS — I10 Essential (primary) hypertension: Secondary | ICD-10-CM | POA: Diagnosis not present

## 2014-12-09 DIAGNOSIS — E1143 Type 2 diabetes mellitus with diabetic autonomic (poly)neuropathy: Secondary | ICD-10-CM | POA: Diagnosis not present

## 2014-12-09 DIAGNOSIS — C169 Malignant neoplasm of stomach, unspecified: Secondary | ICD-10-CM | POA: Diagnosis not present

## 2014-12-12 ENCOUNTER — Emergency Department (HOSPITAL_COMMUNITY)
Admission: EM | Admit: 2014-12-12 | Discharge: 2014-12-13 | Disposition: A | Discharge status: Home / Self Care | Payer: Medicare Other | Attending: Emergency Medicine | Admitting: Emergency Medicine

## 2014-12-12 ENCOUNTER — Encounter (HOSPITAL_COMMUNITY): Payer: Self-pay | Admitting: Nurse Practitioner

## 2014-12-12 DIAGNOSIS — Z7902 Long term (current) use of antithrombotics/antiplatelets: Secondary | ICD-10-CM | POA: Diagnosis not present

## 2014-12-12 DIAGNOSIS — Z87448 Personal history of other diseases of urinary system: Secondary | ICD-10-CM | POA: Insufficient documentation

## 2014-12-12 DIAGNOSIS — E109 Type 1 diabetes mellitus without complications: Secondary | ICD-10-CM | POA: Insufficient documentation

## 2014-12-12 DIAGNOSIS — Z79899 Other long term (current) drug therapy: Secondary | ICD-10-CM | POA: Insufficient documentation

## 2014-12-12 DIAGNOSIS — Z794 Long term (current) use of insulin: Secondary | ICD-10-CM | POA: Insufficient documentation

## 2014-12-12 DIAGNOSIS — Z85028 Personal history of other malignant neoplasm of stomach: Secondary | ICD-10-CM | POA: Insufficient documentation

## 2014-12-12 DIAGNOSIS — M810 Age-related osteoporosis without current pathological fracture: Secondary | ICD-10-CM | POA: Diagnosis not present

## 2014-12-12 DIAGNOSIS — I10 Essential (primary) hypertension: Secondary | ICD-10-CM | POA: Insufficient documentation

## 2014-12-12 DIAGNOSIS — K9413 Enterostomy malfunction: Secondary | ICD-10-CM | POA: Insufficient documentation

## 2014-12-12 DIAGNOSIS — M1990 Unspecified osteoarthritis, unspecified site: Secondary | ICD-10-CM | POA: Insufficient documentation

## 2014-12-12 DIAGNOSIS — K219 Gastro-esophageal reflux disease without esophagitis: Secondary | ICD-10-CM | POA: Diagnosis not present

## 2014-12-12 DIAGNOSIS — F329 Major depressive disorder, single episode, unspecified: Secondary | ICD-10-CM | POA: Insufficient documentation

## 2014-12-12 DIAGNOSIS — C169 Malignant neoplasm of stomach, unspecified: Secondary | ICD-10-CM | POA: Diagnosis not present

## 2014-12-12 DIAGNOSIS — E1143 Type 2 diabetes mellitus with diabetic autonomic (poly)neuropathy: Secondary | ICD-10-CM | POA: Diagnosis not present

## 2014-12-12 DIAGNOSIS — Z934 Other artificial openings of gastrointestinal tract status: Secondary | ICD-10-CM

## 2014-12-12 DIAGNOSIS — Z86718 Personal history of other venous thrombosis and embolism: Secondary | ICD-10-CM | POA: Insufficient documentation

## 2014-12-12 DIAGNOSIS — F419 Anxiety disorder, unspecified: Secondary | ICD-10-CM | POA: Insufficient documentation

## 2014-12-12 DIAGNOSIS — I472 Ventricular tachycardia: Secondary | ICD-10-CM | POA: Diagnosis not present

## 2014-12-12 DIAGNOSIS — E780 Pure hypercholesterolemia: Secondary | ICD-10-CM | POA: Diagnosis not present

## 2014-12-12 DIAGNOSIS — Z8669 Personal history of other diseases of the nervous system and sense organs: Secondary | ICD-10-CM | POA: Insufficient documentation

## 2014-12-12 LAB — CBG MONITORING, ED: GLUCOSE-CAPILLARY: 82 mg/dL (ref 65–99)

## 2014-12-12 MED ORDER — SODIUM CHLORIDE 0.9 % IV SOLN
1000.0000 mL | Freq: Once | INTRAVENOUS | Status: AC
  Administered 2014-12-12: 1000 mL via INTRAVENOUS

## 2014-12-12 MED ORDER — SODIUM CHLORIDE 0.9 % IV SOLN
1000.0000 mL | INTRAVENOUS | Status: DC
  Administered 2014-12-13: 1000 mL via INTRAVENOUS

## 2014-12-12 MED ORDER — INSULIN ASPART 100 UNIT/ML ~~LOC~~ SOLN: 0.0000 [IU] | SUBCUTANEOUS | Status: DC

## 2014-12-12 MED ORDER — LORAZEPAM 2 MG/ML IJ SOLN
1.0000 mg | Freq: Every evening | INTRAMUSCULAR | Status: DC | PRN
  Administered 2014-12-13: 1 mg via INTRAVENOUS
  Filled 2014-12-12: qty 1

## 2014-12-12 NOTE — ED Notes (Signed)
ED sec called to order PEG from facilities

## 2014-12-12 NOTE — ED Notes (Signed)
Caregiver states her feeding tube is stopped up and he can not administer medication through it. He attempted to flush the tube but was unable. Pt denies pain or other complaints

## 2014-12-12 NOTE — ED Notes (Signed)
ED Secretary called for A11 form to be filled out for new PEG 22Fr from materials to be sent to [facility]

## 2014-12-12 NOTE — ED Notes (Signed)
Materials responded to A11 form and stated "I dont think we have what you are looking for"; this RN called OR staff to see if they have a 22Fr PEG tube and gave tube station number and would send if they have one.

## 2014-12-12 NOTE — ED Provider Notes (Signed)
CSN: [id number]     Arrival date & time 12/12/14  1711 History   First MD Initiated Contact with Patient 12/12/14 1808     Chief Complaint  Patient presents with  . GI Problem     (Consider location/radiation/quality/duration/timing/severity/associated sxs/prior Treatment) HPI Comments: Patient is a 81 year old female presenting to the emergency department for evaluation of a clogged J tube. Caregiver states that he attempted to administer the medication through this morning but it would not go. Caregiver states he attempted to flush the tube was unable to. Patient said she has been feeling well otherwise and has no complaints. Denies any fevers, chills, nausea, vomiting, abdominal pain chest pain breath, urinary symptoms, diarrhea or constipation. J tube has been in since January.  Patient is a 81 y.o. female presenting with GI illness.  GI Problem    Past Medical History  Diagnosis Date  . HYPERCHOLESTEROLEMIA 02/01/2008  . ANXIETY 03/08/2008  . PERIPHERAL NEUROPATHY 02/23/2007  . HYPERTENSION 03/08/2008  . GERD 02/23/2007  . DIVERTICULOSIS, COLON 03/08/2008  . OSTEOARTHRITIS 02/23/2007  . OSTEOPOROSIS 02/23/2007  . Urolithiasis   . Allergy     SEASONAL  . Cataract     REMOVED BILATERAL  . Depression   . Stomach cancer 07/20/14    invasive adenocarcinoma w/signet rings features  . DIABETES MELLITUS, TYPE I 02/23/2007  . DVT (deep venous thrombosis)    Past Surgical History  Procedure Laterality Date  . Cholecystectomy  1995  . Esophagogastroduodenoscopy  06/08/2002  . Gastrectomy N/A 07/20/2014    Procedure: PROXIMAL GASTRECTOMY;  Surgeon: Stark Klein, MD;  Location: [place];  Service: General;  Laterality: N/A;  . Laparoscopy N/A 07/20/2014    Procedure: LAPAROSCOPY DIAGNOSTIC;  Surgeon: Stark Klein, MD;  Location: [place];  Service: General;  Laterality: N/A;  . Gastrojejunostomy N/A 07/20/2014    Procedure: GASTROJEJUNOSTOMY;  Surgeon: Stark Klein, MD;  [facility];  Service:  General;  Laterality: N/A;   Family History  Problem Relation Age of Onset  . Cancer Father     Prostate Cancer  . Cancer Sister     Breast Cancer  . Diabetes Sister   . Diabetes Sister    History  Substance Use Topics  . Smoking status: Never Smoker   . Smokeless tobacco: Never Used  . Alcohol Use: No   OB History    No data available     Review of Systems  All other systems reviewed and are negative.     Allergies  Pirfenidone and Aspirin  Home Medications   Prior to Admission medications   Medication Sig Start Date End Date Taking? Authorizing Provider  Alum & Mag Hydroxide-Simeth (MAGIC MOUTHWASH W/LIDOCAINE) SOLN Take 5 mLs by mouth 4 (four) times daily as needed for mouth pain. 10/26/14  Yes Susanne Borders, NP  clopidogrel (PLAVIX) 75 MG tablet Take 1 tablet (75 mg total) by mouth daily. 09/06/14  Yes Renato Shin, MD  esomeprazole (NEXIUM) 40 MG capsule Take 1 capsule (40 mg total) by mouth daily at 12 noon. 09/06/14  Yes Renato Shin, MD  hyaluronate sodium (RADIAPLEXRX) GEL Apply 1 application topically daily. 10/18/14  Yes Kyung Rudd, MD  HYDROcodone-acetaminophen (HYCET) 7.5-325 mg/15 ml solution Take 15 mLs by mouth every 4 (four) hours as needed for moderate pain. 10/25/14  Yes Renato Shin, MD  insulin aspart protamine - aspart (NOVOLOG MIX 70/30 FLEXPEN) (70-30) 100 UNIT/ML FlexPen 35 units each morning and 55 units at bedtime. Patient taking differently: Inject 40 Units  into the skin 2 (two) times daily with a meal.  10/25/14  Yes Renato Shin, MD  LORazepam (ATIVAN) 1 MG tablet Take 1 mg by mouth at bedtime. As needed for sleep   Yes Historical Provider, MD  metoprolol tartrate (LOPRESSOR) 25 MG tablet Take 1 tablet (25 mg total) by mouth 2 (two) times daily. 09/06/14  Yes Renato Shin, MD  mirtazapine (REMERON) 15 MG tablet Take 1 tablet (15 mg total) by mouth at bedtime as needed. Patient taking differently: Take 15 mg by mouth at bedtime.  11/03/14  Yes Owens Shark, NP  promethazine (PHENERGAN) 12.5 MG tablet Take 1 tablet (12.5 mg total) by mouth every 4 (four) hours as needed for nausea or vomiting. 08/17/14  Yes Renato Shin, MD  rosuvastatin (CRESTOR) 10 MG tablet Take 1 tablet (10 mg total) by mouth daily. 08/10/14  Yes Daniel J Angiulli, PA-C   BP 121/57 mmHg  Pulse 67  Temp(Src) 98.1 F (36.7 C) (Oral)  Resp 18  SpO2 95% Physical Exam  Constitutional: She is oriented to person, place, and time. She appears well-developed and well-nourished. No distress.  HENT:  Head: Normocephalic and atraumatic.  Right Ear: External ear normal.  Left Ear: External ear normal.  Nose: Nose normal.  Mouth/Throat: Oropharynx is clear and moist.  Eyes: Conjunctivae are normal.  Neck: Normal range of motion. Neck supple.  No nuchal rigidity.   Cardiovascular: Normal rate, regular rhythm and normal heart sounds.   Pulmonary/Chest: Effort normal and breath sounds normal.  Abdominal: Soft. There is no tenderness.  J tube in place without purulent drainage or surrounding erythema or warmth.  Musculoskeletal: Normal range of motion.  Neurological: She is alert and oriented to person, place, and time.  Skin: Skin is warm and dry. She is not diaphoretic.  Psychiatric: She has a normal mood and affect.  Nursing note and vitals reviewed.   ED Course  Procedures (including critical care time) Medications  0.9 %  sodium chloride infusion (not administered)    Followed by  0.9 %  sodium chloride infusion (not administered)  LORazepam (ATIVAN) injection 1 mg (not administered)  insulin aspart (novoLOG) injection 0-15 Units (not administered)    Labs Review Labs Reviewed - No data to display  Imaging Review No results found.   EKG Interpretation None      MDM   Final diagnoses:  Jejunostomy tube present  Malfunctioning jejunostomy tube    Filed Vitals:   12/12/14 2154  BP: 121/57  Pulse: 67  Temp:   Resp: 18   Afebrile, NAD, non-toxic  appearing, AAOx4.   Patient with J-tube in place with clogged. Abdomen is otherwise soft, nontender, nondistended. There is no erythema, warmth or drainage from G-tube site. G-tube was unsuccessfully declogged in emergency department. Case was discussed with radiology, I have guided J-tube replacement as ordered and patient will board in the emergency department overnight for procedure in the morning. Patient has been made nothing by mouth after midnight. She's been placed on gentle IV hydration and on insulin sliding scale. Family is agreeable to the plan. Patient d/w with Dr. Betsey Holiday, agrees with plan.    Signed out to Dr. Kathrynn Humble.   Baron Sane, PA-C 12/12/14 2346  Orpah Greek, MD 12/13/14 [phone]

## 2014-12-13 ENCOUNTER — Telehealth (HOSPITAL_COMMUNITY): Payer: Self-pay

## 2014-12-13 ENCOUNTER — Emergency Department (HOSPITAL_COMMUNITY): Payer: Medicare Other

## 2014-12-13 DIAGNOSIS — K9413 Enterostomy malfunction: Secondary | ICD-10-CM | POA: Diagnosis not present

## 2014-12-13 LAB — CBG MONITORING, ED: Glucose-Capillary: 113 mg/dL — ABNORMAL HIGH (ref 65–99)

## 2014-12-13 NOTE — ED Notes (Signed)
Pt returned from IR.

## 2014-12-13 NOTE — Discharge Instructions (Signed)
Care of a Feeding Tube People who have trouble swallowing or cannot take food or medicine by mouth are sometimes given feeding tubes. A feeding tube can go into the nose and down to the stomach or through the skin in the abdomen and into the stomach or small bowel. Some of the names of these feeding tubes are gastrostomy tubes, PEG lines, nasogastric tubes, and gastrojejunostomy tubes.  SUPPLIES NEEDED TO CARE FOR THE TUBE SITE  Clean gloves.  Clean wash cloth, gauze pads, or soft paper towel.  Cotton swabs.  Skin barrier ointment or cream.  Soap and water.  Pre-cut foam pads or gauze (that go around the tube).  Tube tape. TUBE SITE CARE 1. Have all supplies ready and available. 2. Wash hands well. 3. Put on clean gloves. 4. Remove the soiled foam pad or gauze, if present, that is found under the tube stabilizer. Change the foam pad or gauze daily or when soiled or moist. 5. Check the skin around the tube site for redness, rash, swelling, drainage, or extra tissue growth. If you notice any of these, call your caregiver. 6. Moisten gauze and cotton swabs with water and soap. 7. Wipe the area closest to the tube (right near the stoma) with cotton swabs. Wipe the surrounding skin with moistened gauze. Rinse with water. 8. Dry the skin and stoma site with a dry gauze pad or soft paper towel. Do not use antibiotic ointments at the tube site. 9. If the skin is red, apply a skin barrier cream or ointment (such as petroleum jelly) in a circular motion, using a cotton swab. The cream or ointment will provide a moisture barrier for the skin and helps with wound healing. 10. Apply a new pre-cut foam pad or gauze around the tube. Secure it with tape around the edges. If no drainage is present, foam pads or gauze may be left off. 11. Use tape or an anchoring device to fasten the feeding tube to the skin for comfort or as directed. Rotate where you tape the tube to avoid skin damage from the  adhesive. 12. Position the person in a semi-upright position (30-45 degree angle). 13. Throw away used supplies. 14. Remove gloves. 15. Wash hands. SUPPLIES NEEDED TO FLUSH A FEEDING TUBE  Clean gloves.  60 mL syringe (that connects to the feeding tube).  Towel.  Water. FLUSHING A FEEDING TUBE  1. Have all supplies ready and available. 2. Wash hands well. 3. Put on clean gloves. 4. Draw up 30 mL of water in the syringe. 5. Kink the feeding tube while disconnecting it from the feeding-bag tubing or while removing the plug at the end of the tube. Kinking closes the tube and prevents secretions in the tube from spilling out. 6. Insert the tip of the syringe into the end of the feeding tube. Release the kink. Slowly inject the water. 7. If unable to inject the water, the person with the feeding tube should lay on his or her left side. The tip of the tube may be against the stomach wall, blocking fluid flow. Changing positions may move the tip away from the stomach wall. After repositioning, try injecting the water again. 8. After injecting the water, remove the syringe. 9. Always flush before giving the first medicine, between medicines, and after the final medicine before starting a feeding. This prevents medicines from clogging the tube. 10. Throw away used supplies. 11. Remove gloves. 12. Wash hands. Document Released: 07/01/2005 Document Revised: 06/17/2012 Document Reviewed: 02/13/2012  ExitCare® Patient Information ©2015 [employer]. This information is not intended to replace advice given to you by your health care provider. Make sure you discuss any questions you have with your health care provider. ° °

## 2014-12-13 NOTE — ED Notes (Signed)
IR sending transportation for pt.

## 2014-12-13 NOTE — ED Notes (Signed)
Per ED provider: Presented to the ER for evaluation of a blocked feeding tube. Patient not experiencing any pain. There is no drainage or signs of infection at the site. Patient has a jejunostomy tube that cannot be changed in the emergency department. Multiple attempts by myself were unsuccessful to unblock the tube. Patient will be held in the ER until morning when interventional radiology can replace the jejunostomy tube.

## 2014-12-13 NOTE — Telephone Encounter (Signed)
Encounter complete. 

## 2014-12-13 NOTE — Progress Notes (Signed)
Tammy Hughes, Dr. Deatra Ina agrees that EGD with dilation probably won't be of benefit if you can let patient know. Thanks

## 2014-12-13 NOTE — ED Notes (Signed)
Pt transported to IR 

## 2014-12-14 DIAGNOSIS — I472 Ventricular tachycardia: Secondary | ICD-10-CM | POA: Diagnosis not present

## 2014-12-14 DIAGNOSIS — M810 Age-related osteoporosis without current pathological fracture: Secondary | ICD-10-CM | POA: Diagnosis not present

## 2014-12-14 DIAGNOSIS — I10 Essential (primary) hypertension: Secondary | ICD-10-CM | POA: Diagnosis not present

## 2014-12-14 DIAGNOSIS — E1143 Type 2 diabetes mellitus with diabetic autonomic (poly)neuropathy: Secondary | ICD-10-CM | POA: Diagnosis not present

## 2014-12-14 DIAGNOSIS — C169 Malignant neoplasm of stomach, unspecified: Secondary | ICD-10-CM | POA: Diagnosis not present

## 2014-12-14 DIAGNOSIS — F329 Major depressive disorder, single episode, unspecified: Secondary | ICD-10-CM | POA: Diagnosis not present

## 2014-12-15 ENCOUNTER — Ambulatory Visit (HOSPITAL_COMMUNITY)
Admission: RE | Admit: 2014-12-15 | Discharge: 2014-12-15 | Disposition: A | Discharge status: Home / Self Care | Payer: Medicare Other | Source: Ambulatory Visit | Attending: Cardiovascular Disease | Admitting: Cardiovascular Disease

## 2014-12-15 DIAGNOSIS — I472 Ventricular tachycardia: Secondary | ICD-10-CM | POA: Diagnosis not present

## 2014-12-15 DIAGNOSIS — I4729 Other ventricular tachycardia: Secondary | ICD-10-CM

## 2014-12-15 DIAGNOSIS — E785 Hyperlipidemia, unspecified: Secondary | ICD-10-CM | POA: Insufficient documentation

## 2014-12-15 DIAGNOSIS — E119 Type 2 diabetes mellitus without complications: Secondary | ICD-10-CM | POA: Insufficient documentation

## 2014-12-15 DIAGNOSIS — R079 Chest pain, unspecified: Secondary | ICD-10-CM | POA: Insufficient documentation

## 2014-12-15 LAB — MYOCARDIAL PERFUSION IMAGING
CHL CUP RESTING HR STRESS: 61 {beats}/min
CHL CUP STRESS STAGE 2 SPEED: 0 mph
CHL CUP STRESS STAGE 3 GRADE: 0 %
Estimated workload: 1 METS
LVDIAVOL: 52 mL
LVSYSVOL: 19 mL
NUC STRESS EF: 63 %
Peak HR: 81 {beats}/min
Percent of predicted max HR: 57 %
SDS: 0
SRS: 2
SSS: 2
Stage 1 DBP: 74 mmHg
Stage 1 Grade: 0 %
Stage 1 HR: 62 {beats}/min
Stage 1 SBP: 147 mmHg
Stage 1 Speed: 0 mph
Stage 2 Grade: 0 %
Stage 2 HR: 61 {beats}/min
Stage 3 HR: 81 {beats}/min
Stage 3 Speed: 0 mph
Stage 4 DBP: 79 mmHg
Stage 4 Grade: 0 %
Stage 4 HR: 68 {beats}/min
Stage 4 SBP: 165 mmHg
Stage 4 Speed: 0 mph
TID: 1.01

## 2014-12-15 MED ORDER — TECHNETIUM TC 99M SESTAMIBI GENERIC - CARDIOLITE
30.1000 | Freq: Once | INTRAVENOUS | Status: AC | PRN
  Administered 2014-12-15: 30.1 via INTRAVENOUS

## 2014-12-15 MED ORDER — TECHNETIUM TC 99M SESTAMIBI GENERIC - CARDIOLITE
10.4000 | Freq: Once | INTRAVENOUS | Status: AC | PRN
  Administered 2014-12-15: 10 via INTRAVENOUS

## 2014-12-15 MED ORDER — AMINOPHYLLINE 25 MG/ML IV SOLN
75.0000 mg | Freq: Once | INTRAVENOUS | Status: AC
  Administered 2014-12-15: 75 mg via INTRAVENOUS

## 2014-12-15 MED ORDER — REGADENOSON 0.4 MG/5ML IV SOLN
0.4000 mg | Freq: Once | INTRAVENOUS | Status: AC
  Administered 2014-12-15: 0.4 mg via INTRAVENOUS

## 2014-12-19 ENCOUNTER — Other Ambulatory Visit: Payer: Self-pay | Admitting: Nurse Practitioner

## 2014-12-19 DIAGNOSIS — I472 Ventricular tachycardia: Secondary | ICD-10-CM | POA: Diagnosis not present

## 2014-12-19 DIAGNOSIS — M810 Age-related osteoporosis without current pathological fracture: Secondary | ICD-10-CM | POA: Diagnosis not present

## 2014-12-19 DIAGNOSIS — C169 Malignant neoplasm of stomach, unspecified: Secondary | ICD-10-CM | POA: Diagnosis not present

## 2014-12-19 DIAGNOSIS — E1143 Type 2 diabetes mellitus with diabetic autonomic (poly)neuropathy: Secondary | ICD-10-CM | POA: Diagnosis not present

## 2014-12-19 DIAGNOSIS — F329 Major depressive disorder, single episode, unspecified: Secondary | ICD-10-CM | POA: Diagnosis not present

## 2014-12-19 DIAGNOSIS — I10 Essential (primary) hypertension: Secondary | ICD-10-CM | POA: Diagnosis not present

## 2014-12-20 DIAGNOSIS — E109 Type 1 diabetes mellitus without complications: Secondary | ICD-10-CM | POA: Diagnosis not present

## 2014-12-20 LAB — HM DIABETES EYE EXAM

## 2014-12-21 DIAGNOSIS — I10 Essential (primary) hypertension: Secondary | ICD-10-CM | POA: Diagnosis not present

## 2014-12-21 DIAGNOSIS — I472 Ventricular tachycardia: Secondary | ICD-10-CM | POA: Diagnosis not present

## 2014-12-21 DIAGNOSIS — F329 Major depressive disorder, single episode, unspecified: Secondary | ICD-10-CM | POA: Diagnosis not present

## 2014-12-21 DIAGNOSIS — C169 Malignant neoplasm of stomach, unspecified: Secondary | ICD-10-CM | POA: Diagnosis not present

## 2014-12-21 DIAGNOSIS — M810 Age-related osteoporosis without current pathological fracture: Secondary | ICD-10-CM | POA: Diagnosis not present

## 2014-12-21 DIAGNOSIS — E1143 Type 2 diabetes mellitus with diabetic autonomic (poly)neuropathy: Secondary | ICD-10-CM | POA: Diagnosis not present

## 2014-12-22 DIAGNOSIS — C169 Malignant neoplasm of stomach, unspecified: Secondary | ICD-10-CM | POA: Diagnosis not present

## 2014-12-23 DIAGNOSIS — C169 Malignant neoplasm of stomach, unspecified: Secondary | ICD-10-CM | POA: Diagnosis not present

## 2014-12-26 DIAGNOSIS — I472 Ventricular tachycardia: Secondary | ICD-10-CM | POA: Diagnosis not present

## 2014-12-26 DIAGNOSIS — C169 Malignant neoplasm of stomach, unspecified: Secondary | ICD-10-CM | POA: Diagnosis not present

## 2014-12-26 DIAGNOSIS — E1143 Type 2 diabetes mellitus with diabetic autonomic (poly)neuropathy: Secondary | ICD-10-CM | POA: Diagnosis not present

## 2014-12-26 DIAGNOSIS — I10 Essential (primary) hypertension: Secondary | ICD-10-CM | POA: Diagnosis not present

## 2014-12-26 DIAGNOSIS — F329 Major depressive disorder, single episode, unspecified: Secondary | ICD-10-CM | POA: Diagnosis not present

## 2014-12-26 DIAGNOSIS — M810 Age-related osteoporosis without current pathological fracture: Secondary | ICD-10-CM | POA: Diagnosis not present

## 2014-12-27 DIAGNOSIS — C169 Malignant neoplasm of stomach, unspecified: Secondary | ICD-10-CM | POA: Diagnosis not present

## 2014-12-27 DIAGNOSIS — I10 Essential (primary) hypertension: Secondary | ICD-10-CM | POA: Diagnosis not present

## 2014-12-27 DIAGNOSIS — F329 Major depressive disorder, single episode, unspecified: Secondary | ICD-10-CM | POA: Diagnosis not present

## 2014-12-27 DIAGNOSIS — M810 Age-related osteoporosis without current pathological fracture: Secondary | ICD-10-CM | POA: Diagnosis not present

## 2014-12-27 DIAGNOSIS — E1143 Type 2 diabetes mellitus with diabetic autonomic (poly)neuropathy: Secondary | ICD-10-CM | POA: Diagnosis not present

## 2014-12-27 DIAGNOSIS — I472 Ventricular tachycardia: Secondary | ICD-10-CM | POA: Diagnosis not present

## 2014-12-28 ENCOUNTER — Telehealth: Payer: Self-pay | Admitting: Endocrinology

## 2014-12-28 DIAGNOSIS — E1143 Type 2 diabetes mellitus with diabetic autonomic (poly)neuropathy: Secondary | ICD-10-CM | POA: Diagnosis not present

## 2014-12-28 DIAGNOSIS — I472 Ventricular tachycardia: Secondary | ICD-10-CM | POA: Diagnosis not present

## 2014-12-28 DIAGNOSIS — F329 Major depressive disorder, single episode, unspecified: Secondary | ICD-10-CM | POA: Diagnosis not present

## 2014-12-28 DIAGNOSIS — M810 Age-related osteoporosis without current pathological fracture: Secondary | ICD-10-CM | POA: Diagnosis not present

## 2014-12-28 DIAGNOSIS — C169 Malignant neoplasm of stomach, unspecified: Secondary | ICD-10-CM | POA: Diagnosis not present

## 2014-12-28 DIAGNOSIS — I10 Essential (primary) hypertension: Secondary | ICD-10-CM | POA: Diagnosis not present

## 2014-12-28 NOTE — Telephone Encounter (Signed)
See note below and please advise Dr. Cordelia Pen absence. Thanks!

## 2014-12-28 NOTE — Telephone Encounter (Signed)
Pt called and has a rash and wants apt, would doctor kumar  Be willing to treat

## 2014-12-28 NOTE — Telephone Encounter (Signed)
Apply Triple antibiotic 2x daily

## 2014-12-28 NOTE — Telephone Encounter (Signed)
[place] with [place] calling regarding her feeding tube. The skin around her insertion site is pink and puffy, this happened overnight # [phone]

## 2014-12-29 DIAGNOSIS — E1143 Type 2 diabetes mellitus with diabetic autonomic (poly)neuropathy: Secondary | ICD-10-CM | POA: Diagnosis not present

## 2014-12-29 DIAGNOSIS — I472 Ventricular tachycardia: Secondary | ICD-10-CM | POA: Diagnosis not present

## 2014-12-29 DIAGNOSIS — I10 Essential (primary) hypertension: Secondary | ICD-10-CM | POA: Diagnosis not present

## 2014-12-29 DIAGNOSIS — M810 Age-related osteoporosis without current pathological fracture: Secondary | ICD-10-CM | POA: Diagnosis not present

## 2014-12-29 DIAGNOSIS — C169 Malignant neoplasm of stomach, unspecified: Secondary | ICD-10-CM | POA: Diagnosis not present

## 2014-12-29 DIAGNOSIS — F329 Major depressive disorder, single episode, unspecified: Secondary | ICD-10-CM | POA: Diagnosis not present

## 2014-12-29 NOTE — Telephone Encounter (Signed)
I contacted [place] and advised of note below. Requested call back if she would like to discuss.

## 2014-12-30 DIAGNOSIS — E1143 Type 2 diabetes mellitus with diabetic autonomic (poly)neuropathy: Secondary | ICD-10-CM | POA: Diagnosis not present

## 2014-12-30 DIAGNOSIS — I472 Ventricular tachycardia: Secondary | ICD-10-CM | POA: Diagnosis not present

## 2014-12-30 DIAGNOSIS — M810 Age-related osteoporosis without current pathological fracture: Secondary | ICD-10-CM | POA: Diagnosis not present

## 2014-12-30 DIAGNOSIS — F329 Major depressive disorder, single episode, unspecified: Secondary | ICD-10-CM | POA: Diagnosis not present

## 2014-12-30 DIAGNOSIS — I10 Essential (primary) hypertension: Secondary | ICD-10-CM | POA: Diagnosis not present

## 2014-12-30 DIAGNOSIS — C169 Malignant neoplasm of stomach, unspecified: Secondary | ICD-10-CM | POA: Diagnosis not present

## 2015-01-02 DIAGNOSIS — E1143 Type 2 diabetes mellitus with diabetic autonomic (poly)neuropathy: Secondary | ICD-10-CM | POA: Diagnosis not present

## 2015-01-02 DIAGNOSIS — M810 Age-related osteoporosis without current pathological fracture: Secondary | ICD-10-CM | POA: Diagnosis not present

## 2015-01-02 DIAGNOSIS — F329 Major depressive disorder, single episode, unspecified: Secondary | ICD-10-CM | POA: Diagnosis not present

## 2015-01-02 DIAGNOSIS — I472 Ventricular tachycardia: Secondary | ICD-10-CM | POA: Diagnosis not present

## 2015-01-02 DIAGNOSIS — C169 Malignant neoplasm of stomach, unspecified: Secondary | ICD-10-CM | POA: Diagnosis not present

## 2015-01-02 DIAGNOSIS — I10 Essential (primary) hypertension: Secondary | ICD-10-CM | POA: Diagnosis not present

## 2015-01-03 DIAGNOSIS — E1143 Type 2 diabetes mellitus with diabetic autonomic (poly)neuropathy: Secondary | ICD-10-CM | POA: Diagnosis not present

## 2015-01-03 DIAGNOSIS — C169 Malignant neoplasm of stomach, unspecified: Secondary | ICD-10-CM | POA: Diagnosis not present

## 2015-01-03 DIAGNOSIS — F329 Major depressive disorder, single episode, unspecified: Secondary | ICD-10-CM | POA: Diagnosis not present

## 2015-01-03 DIAGNOSIS — M810 Age-related osteoporosis without current pathological fracture: Secondary | ICD-10-CM | POA: Diagnosis not present

## 2015-01-03 DIAGNOSIS — I472 Ventricular tachycardia: Secondary | ICD-10-CM | POA: Diagnosis not present

## 2015-01-03 DIAGNOSIS — I10 Essential (primary) hypertension: Secondary | ICD-10-CM | POA: Diagnosis not present

## 2015-01-04 ENCOUNTER — Encounter: Payer: Self-pay | Admitting: Endocrinology

## 2015-01-05 ENCOUNTER — Encounter (HOSPITAL_COMMUNITY): Payer: Self-pay | Admitting: Emergency Medicine

## 2015-01-05 ENCOUNTER — Emergency Department (HOSPITAL_COMMUNITY)
Admission: EM | Admit: 2015-01-05 | Discharge: 2015-01-05 | Disposition: A | Discharge status: Home / Self Care | Payer: Medicare Other | Attending: Emergency Medicine | Admitting: Emergency Medicine

## 2015-01-05 ENCOUNTER — Emergency Department (HOSPITAL_COMMUNITY): Payer: Medicare Other

## 2015-01-05 DIAGNOSIS — Z794 Long term (current) use of insulin: Secondary | ICD-10-CM | POA: Insufficient documentation

## 2015-01-05 DIAGNOSIS — E780 Pure hypercholesterolemia: Secondary | ICD-10-CM | POA: Insufficient documentation

## 2015-01-05 DIAGNOSIS — Z86718 Personal history of other venous thrombosis and embolism: Secondary | ICD-10-CM | POA: Diagnosis not present

## 2015-01-05 DIAGNOSIS — H269 Unspecified cataract: Secondary | ICD-10-CM | POA: Diagnosis not present

## 2015-01-05 DIAGNOSIS — Z85028 Personal history of other malignant neoplasm of stomach: Secondary | ICD-10-CM | POA: Diagnosis not present

## 2015-01-05 DIAGNOSIS — I10 Essential (primary) hypertension: Secondary | ICD-10-CM | POA: Diagnosis not present

## 2015-01-05 DIAGNOSIS — M4726 Other spondylosis with radiculopathy, lumbar region: Secondary | ICD-10-CM | POA: Diagnosis not present

## 2015-01-05 DIAGNOSIS — E109 Type 1 diabetes mellitus without complications: Secondary | ICD-10-CM | POA: Diagnosis not present

## 2015-01-05 DIAGNOSIS — K219 Gastro-esophageal reflux disease without esophagitis: Secondary | ICD-10-CM | POA: Insufficient documentation

## 2015-01-05 DIAGNOSIS — Z79899 Other long term (current) drug therapy: Secondary | ICD-10-CM | POA: Diagnosis not present

## 2015-01-05 DIAGNOSIS — Z87448 Personal history of other diseases of urinary system: Secondary | ICD-10-CM | POA: Insufficient documentation

## 2015-01-05 DIAGNOSIS — C169 Malignant neoplasm of stomach, unspecified: Secondary | ICD-10-CM | POA: Diagnosis not present

## 2015-01-05 DIAGNOSIS — F419 Anxiety disorder, unspecified: Secondary | ICD-10-CM | POA: Insufficient documentation

## 2015-01-05 DIAGNOSIS — M545 Low back pain: Secondary | ICD-10-CM | POA: Diagnosis present

## 2015-01-05 DIAGNOSIS — F329 Major depressive disorder, single episode, unspecified: Secondary | ICD-10-CM | POA: Insufficient documentation

## 2015-01-05 DIAGNOSIS — E1143 Type 2 diabetes mellitus with diabetic autonomic (poly)neuropathy: Secondary | ICD-10-CM | POA: Diagnosis not present

## 2015-01-05 DIAGNOSIS — I472 Ventricular tachycardia: Secondary | ICD-10-CM | POA: Diagnosis not present

## 2015-01-05 DIAGNOSIS — M810 Age-related osteoporosis without current pathological fracture: Secondary | ICD-10-CM | POA: Diagnosis not present

## 2015-01-05 DIAGNOSIS — M5442 Lumbago with sciatica, left side: Secondary | ICD-10-CM | POA: Insufficient documentation

## 2015-01-05 DIAGNOSIS — M5416 Radiculopathy, lumbar region: Secondary | ICD-10-CM | POA: Diagnosis not present

## 2015-01-05 DIAGNOSIS — M47816 Spondylosis without myelopathy or radiculopathy, lumbar region: Secondary | ICD-10-CM | POA: Diagnosis not present

## 2015-01-05 MED ORDER — HYDROMORPHONE HCL 1 MG/ML IJ SOLN
0.5000 mg | Freq: Once | INTRAMUSCULAR | Status: AC
  Administered 2015-01-05: 0.5 mg via INTRAMUSCULAR
  Filled 2015-01-05: qty 1

## 2015-01-05 MED ORDER — TRAMADOL HCL 50 MG PO TABS: 50.0000 mg | ORAL_TABLET | Freq: Four times a day (QID) | ORAL | Status: DC | PRN

## 2015-01-05 MED ORDER — HYDROCODONE-ACETAMINOPHEN 5-325 MG PO TABS
1.0000 | ORAL_TABLET | Freq: Once | ORAL | Status: DC
  Filled 2015-01-05: qty 1

## 2015-01-05 NOTE — ED Provider Notes (Signed)
CSN: [id number]     Arrival date & time 01/05/15  1158 History   First MD Initiated Contact with Patient 01/05/15 1512     Chief Complaint  Patient presents with  . Back Pain     (Consider location/radiation/quality/duration/timing/severity/associated sxs/prior Treatment) HPI  Tammy Hughes is a 81 y.o. female who presents for evaluation of left lower back pain which has been present for several days, and not being improved with home treatment, by physical therapy. She feels like she injured her back when she pulled herself over in bed, several days ago. There is been no fall. She continues to be able to ambulate, but is using a walker at this time. The pain radiates to her left leg. She is using pain medicines at home with partial relief. No reported fever, chills, nausea, vomiting, stool or urine incontinence. There are no other known modifying factors.    Past Medical History  Diagnosis Date  . HYPERCHOLESTEROLEMIA 02/01/2008  . ANXIETY 03/08/2008  . PERIPHERAL NEUROPATHY 02/23/2007  . HYPERTENSION 03/08/2008  . GERD 02/23/2007  . DIVERTICULOSIS, COLON 03/08/2008  . OSTEOARTHRITIS 02/23/2007  . OSTEOPOROSIS 02/23/2007  . Urolithiasis   . Allergy     SEASONAL  . Cataract     REMOVED BILATERAL  . Depression   . Stomach cancer 07/20/14    invasive adenocarcinoma w/signet rings features  . DIABETES MELLITUS, TYPE I 02/23/2007  . DVT (deep venous thrombosis)    Past Surgical History  Procedure Laterality Date  . Cholecystectomy  1995  . Esophagogastroduodenoscopy  06/08/2002  . Gastrectomy N/A 07/20/2014    Procedure: PROXIMAL GASTRECTOMY;  Surgeon: Stark Klein, MD;  Location: [place];  Service: General;  Laterality: N/A;  . Laparoscopy N/A 07/20/2014    Procedure: LAPAROSCOPY DIAGNOSTIC;  Surgeon: Stark Klein, MD;  Location: [place];  Service: General;  Laterality: N/A;  . Gastrojejunostomy N/A 07/20/2014    Procedure: GASTROJEJUNOSTOMY;  Surgeon: Stark Klein, MD;  [facility];   Service: General;  Laterality: N/A;   Family History  Problem Relation Age of Onset  . Cancer Father     Prostate Cancer  . Cancer Sister     Breast Cancer  . Diabetes Sister   . Diabetes Sister    History  Substance Use Topics  . Smoking status: Never Smoker   . Smokeless tobacco: Never Used  . Alcohol Use: No   OB History    No data available     Review of Systems  All other systems reviewed and are negative.     Allergies  Pirfenidone and Aspirin  Home Medications   Prior to Admission medications   Medication Sig Start Date End Date Taking? Authorizing Provider  Alum & Mag Hydroxide-Simeth (MAGIC MOUTHWASH W/LIDOCAINE) SOLN Take 5 mLs by mouth 4 (four) times daily as needed for mouth pain. 10/26/14   Susanne Borders, NP  clopidogrel (PLAVIX) 75 MG tablet Take 1 tablet (75 mg total) by mouth daily. 09/06/14   Renato Shin, MD  esomeprazole (NEXIUM) 40 MG capsule Take 1 capsule (40 mg total) by mouth daily at 12 noon. 09/06/14   Renato Shin, MD  hyaluronate sodium (RADIAPLEXRX) GEL Apply 1 application topically daily. 10/18/14   Kyung Rudd, MD  HYDROcodone-acetaminophen (HYCET) 7.5-325 mg/15 ml solution Take 15 mLs by mouth every 4 (four) hours as needed for moderate pain. 10/25/14   Renato Shin, MD  insulin aspart protamine - aspart (NOVOLOG MIX 70/30 FLEXPEN) (70-30) 100 UNIT/ML FlexPen 35 units each morning  and 55 units at bedtime. Patient taking differently: Inject 40 Units into the skin 2 (two) times daily with a meal.  10/25/14   Renato Shin, MD  LORazepam (ATIVAN) 1 MG tablet Take 1 mg by mouth at bedtime. As needed for sleep    Historical Provider, MD  metoprolol tartrate (LOPRESSOR) 25 MG tablet Take 1 tablet (25 mg total) by mouth 2 (two) times daily. 09/06/14   Renato Shin, MD  mirtazapine (REMERON) 15 MG tablet Take 1 tablet (15 mg total) by mouth at bedtime as needed. Patient taking differently: Take 15 mg by mouth at bedtime.  11/03/14   Owens Shark, NP   promethazine (PHENERGAN) 12.5 MG tablet Take 1 tablet (12.5 mg total) by mouth every 4 (four) hours as needed for nausea or vomiting. 08/17/14   Renato Shin, MD  rosuvastatin (CRESTOR) 10 MG tablet Take 1 tablet (10 mg total) by mouth daily. 08/10/14   Lavon Paganini Angiulli, PA-C  traMADol (ULTRAM) 50 MG tablet Take 1 tablet (50 mg total) by mouth every 6 (six) hours as needed. 01/05/15   Daleen Bo, MD   BP 110/66 mmHg  Pulse 70  Temp(Src) 97.6 F (36.4 C) (Oral)  Resp 16  SpO2 100% Physical Exam  Constitutional: She appears well-developed. She appears distressed (She is uncomfortable).  Elderly, frail  HENT:  Head: Normocephalic and atraumatic.  Right Ear: External ear normal.  Left Ear: External ear normal.  Eyes: Conjunctivae and EOM are normal. Pupils are equal, round, and reactive to light.  Neck: Normal range of motion and phonation normal. Neck supple.  Cardiovascular: Normal rate, regular rhythm and normal heart sounds.   Pulmonary/Chest: Effort normal and breath sounds normal. She exhibits no bony tenderness.  Abdominal: Soft. There is no tenderness.  Musculoskeletal: Normal range of motion.  Mild left lumbar, and left hip tenderness to palpation and resistance of movement secondary to pain at each of these sites.  Neurological: She is alert. No cranial nerve deficit or sensory deficit. She exhibits normal muscle tone. Coordination normal.  Skin: Skin is warm, dry and intact.  Psychiatric: She has a normal mood and affect. Her behavior is normal.  Nursing note and vitals reviewed.   ED Course  Procedures (including critical care time)  Medications  HYDROmorphone (DILAUDID) injection 0.5 mg (0.5 mg Intramuscular Given 01/05/15 1623)    Patient Vitals for the past 24 hrs:  BP Temp Temp src Pulse Resp SpO2  01/05/15 1446 110/66 mmHg 97.6 F (36.4 C) Oral 70 16 100 %  01/05/15 1223 144/72 mmHg 97.6 F (36.4 C) Oral 66 18 100 %    5:38 PM Reevaluation with update and  discussion. After initial assessment and treatment, an updated evaluation reveals she is somewhat sedated, but feels better after the treatment, which has been given. Patient's daughter reports that she is using tramadol, at home. Findings discussed with patient and family members, all questions answered. [place] Review Labs Reviewed - No data to display  Imaging Review Dg Lumbar Spine Complete  01/05/2015   CLINICAL DATA:  Low back pain for 4 days radiating into left knee. Initial encounter.  EXAM: LUMBAR SPINE - COMPLETE 4+ VIEW  COMPARISON:  CT abdomen and pelvis 09/02/2014.  FINDINGS: There is no evidence of lumbar spine fracture. Alignment is normal. Intervertebral disc spaces are maintained. There is some lower lumbar facet degenerative disease. Jejunostomy tube is noted. Atherosclerotic vascular disease is seen.  IMPRESSION: Mild lower lumbar facet  arthropathy.   Electronically Signed   By: Inge Rise M.D.   On: 01/05/2015 17:20     EKG Interpretation None      MDM   Final diagnoses:  Left-sided low back pain with left-sided sciatica  Osteoarthritis of spine with radiculopathy, lumbar region    Lumbar pain, with sciatica and mild degenerative joint disease. Doubt cauda equina syndrome, spinal myelopathy or fracture.  Nursing Notes Reviewed/ Care Coordinated Applicable Imaging Reviewed Interpretation of Laboratory Data incorporated into ED treatment  The patient appears reasonably screened and/or stabilized for discharge and I doubt any other medical condition or other [facility] requiring further screening, evaluation, or treatment in the ED at this time prior to discharge.  Plan: Home Medications- Tramadol; Home Treatments- rest, heat ; return here if the recommended treatment, does not improve the symptoms; Recommended follow up- PCP 1 week     Daleen Bo, MD 01/05/15 1739

## 2015-01-05 NOTE — Discharge Instructions (Signed)
Back Pain, Adult °Back pain is very common. The pain often gets better over time. The cause of back pain is usually not dangerous. Most people can learn to manage their back pain on their own.  °HOME CARE  °· Stay active. Start with short walks on flat ground if you can. Try to walk farther each day. °· Do not sit, drive, or stand in one place for more than 30 minutes. Do not stay in bed. °· Do not avoid exercise or work. Activity can help your back heal faster. °· Be careful when you bend or lift an object. Bend at your knees, keep the object close to you, and do not twist. °· Sleep on a firm mattress. Lie on your side, and bend your knees. If you lie on your back, put a pillow under your knees. °· Only take medicines as told by your doctor. °· Put ice on the injured area. °¨ Put ice in a plastic bag. °¨ Place a towel between your skin and the bag. °¨ Leave the ice on for 15-20 minutes, 03-04 times a day for the first 2 to 3 days. After that, you can switch between ice and heat packs. °· Ask your doctor about back exercises or massage. °· Avoid feeling anxious or stressed. Find good ways to deal with stress, such as exercise. °GET HELP RIGHT AWAY IF:  °· Your pain does not go away with rest or medicine. °· Your pain does not go away in 1 week. °· You have new problems. °· You do not feel well. °· The pain spreads into your legs. °· You cannot control when you poop (bowel movement) or pee (urinate). °· Your arms or legs feel weak or lose feeling (numbness). °· You feel sick to your stomach (nauseous) or throw up (vomit). °· You have belly (abdominal) pain. °· You feel like you may pass out (faint). °MAKE SURE YOU:  °· Understand these instructions. °· Will watch your condition. °· Will get help right away if you are not doing well or get worse. °Document Released: 12/18/2007 Document Revised: 09/23/2011 Document Reviewed: 11/02/2013 °ExitCare® Patient Information ©2015 [employer]. This information is not intended  to replace advice given to you by your health care provider. Make sure you discuss any questions you have with your health care provider. ° °

## 2015-01-05 NOTE — ED Notes (Signed)
Pt c/o lower back pain on left side with radiation down left leg; pt sts hx of stomach CA and has feeding tube; pt sts started after moving and pulling in bed

## 2015-01-11 ENCOUNTER — Encounter: Payer: Self-pay | Admitting: Internal Medicine

## 2015-01-11 ENCOUNTER — Ambulatory Visit (INDEPENDENT_AMBULATORY_CARE_PROVIDER_SITE_OTHER): Payer: Medicare Other | Admitting: Internal Medicine

## 2015-01-11 VITALS — BP 144/80 | HR 67 | Ht 60.0 in | Wt 144.0 lb

## 2015-01-11 DIAGNOSIS — I82409 Acute embolism and thrombosis of unspecified deep veins of unspecified lower extremity: Secondary | ICD-10-CM | POA: Diagnosis not present

## 2015-01-11 DIAGNOSIS — R0789 Other chest pain: Secondary | ICD-10-CM | POA: Diagnosis not present

## 2015-01-11 DIAGNOSIS — R1310 Dysphagia, unspecified: Secondary | ICD-10-CM | POA: Diagnosis not present

## 2015-01-11 NOTE — Progress Notes (Signed)
OFFICE NOTE  Chief Complaint:  Follow-up stress test  Primary Care Physician: Renato Shin, MD  HPI:  Tammy Hughes is a 81 y.o. female with a past medical history significant for DM2, dyslipidemia, and gastric cancer - no known history of CAD. She is POD #3 today and getting tube feeds, without resumption of bowel function as of yet. She was noted overnight to have an asymptomatic 18 beat run of NSVT, which self-terminated. She denies any recent chest pain or worsening shortness of breath. Her last EKG in the system was in 10/2013, which showed NSR with borderline LVH. She underwent a repeat echocardiogram in the hospital which showed a preserved EF of 55-60% with no wall motion abnormalities. I recommended starting her on Lopressor 25 mg twice daily which is taking without difficulty. She's been asymptomatic from a cardiac standpoint. She did undergo partial gastrectomy for her cancer on July 20 2014. She tolerated surgery well and did not have any preoperative stress testing. Was felt that she might benefit from stress testing as an outpatient however since she did so well for his surgery it's not clear that that is necessary. She may or may not be undergoing chemotherapy as there was evidence that the cancer has spread to lymph nodes. She is scheduled to see Dr. Benay Spice next week.  I saw Tammy Hughes back in the office today. She was recently seen in the hospital and [facility] for chest pain. She had not previously had chest pain. An echocardiogram was performed for nonsustained VT which demonstrated normal systolic function. She does have risk factors however for coronary disease. She ruled out for MI and Tammy Hughes and no further testing was undertaken. That was last week which she was in the emergency room. She also developed an upper extremity DVT related to her chemotherapy port. She was treated with 3 weeks of Xarelto and taken off of Plavix and then placed back on plavix.  Tammy Hughes  return to the office today for follow-up of her stress test. This was negative for ischemia. She reports her chest discomfort has resolved.   PMHx:  Past Medical History  Diagnosis Date  . HYPERCHOLESTEROLEMIA 02/01/2008  . ANXIETY 03/08/2008  . PERIPHERAL NEUROPATHY 02/23/2007  . HYPERTENSION 03/08/2008  . GERD 02/23/2007  . DIVERTICULOSIS, COLON 03/08/2008  . OSTEOARTHRITIS 02/23/2007  . OSTEOPOROSIS 02/23/2007  . Urolithiasis   . Allergy     SEASONAL  . Cataract     REMOVED BILATERAL  . Depression   . Stomach cancer 07/20/14    invasive adenocarcinoma w/signet rings features  . DIABETES MELLITUS, TYPE I 02/23/2007  . DVT (deep venous thrombosis)     Past Surgical History  Procedure Laterality Date  . Cholecystectomy  1995  . Esophagogastroduodenoscopy  06/08/2002  . Gastrectomy N/A 07/20/2014    Procedure: PROXIMAL GASTRECTOMY;  Surgeon: Stark Klein, MD;  Location: [place];  Service: General;  Laterality: N/A;  . Laparoscopy N/A 07/20/2014    Procedure: LAPAROSCOPY DIAGNOSTIC;  Surgeon: Stark Klein, MD;  Location: [place];  Service: General;  Laterality: N/A;  . Gastrojejunostomy N/A 07/20/2014    Procedure: GASTROJEJUNOSTOMY;  Surgeon: Stark Klein, MD;  Location: [facility];  Service: General;  Laterality: N/A;    FAMHx:  Family History  Problem Relation Age of Onset  . Cancer Father     Prostate Cancer  . Cancer Sister     Breast Cancer  . Diabetes Sister   . Diabetes Sister     SOCHx:  reports that she has never smoked. She has never used smokeless tobacco. She reports that she does not drink alcohol or use illicit drugs.  ALLERGIES:  Allergies  Allergen Reactions  . Pirfenidone Diarrhea and Nausea And Vomiting  . Aspirin     rash    ROS: A comprehensive review of systems was negative.  HOME MEDS: Current Outpatient Prescriptions  Medication Sig Dispense Refill  . Alum & Mag Hydroxide-Simeth (MAGIC MOUTHWASH W/LIDOCAINE) SOLN Take 5 mLs by mouth 4 (four) times daily  as needed for mouth pain. 120 mL 1  . clopidogrel (PLAVIX) 75 MG tablet Take 1 tablet (75 mg total) by mouth daily. 90 tablet 1  . esomeprazole (NEXIUM) 40 MG capsule Take 1 capsule (40 mg total) by mouth daily at 12 noon. 90 capsule 2  . hyaluronate sodium (RADIAPLEXRX) GEL Apply 1 application topically daily.    Marland Kitchen HYDROcodone-acetaminophen (HYCET) 7.5-325 mg/15 ml solution Take 15 mLs by mouth every 4 (four) hours as needed for moderate pain. 473 mL 0  . insulin aspart protamine - aspart (NOVOLOG MIX 70/30 FLEXPEN) (70-30) 100 UNIT/ML FlexPen 35 units each morning and 55 units at bedtime. (Patient taking differently: Inject 40 Units into the skin 2 (two) times daily with a meal. ) 30 mL 11  . LORazepam (ATIVAN) 1 MG tablet Take 1 mg by mouth at bedtime. As needed for sleep    . metoprolol tartrate (LOPRESSOR) 25 MG tablet Take 1 tablet (25 mg total) by mouth 2 (two) times daily. 180 tablet 1  . mirtazapine (REMERON) 15 MG tablet Take 1 tablet (15 mg total) by mouth at bedtime as needed. (Patient taking differently: Take 15 mg by mouth at bedtime. ) 30 tablet 0  . promethazine (PHENERGAN) 12.5 MG tablet Take 1 tablet (12.5 mg total) by mouth every 4 (four) hours as needed for nausea or vomiting. 50 tablet 2  . rosuvastatin (CRESTOR) 10 MG tablet Take 1 tablet (10 mg total) by mouth daily. 90 tablet 3  . traMADol (ULTRAM) 50 MG tablet Take 1 tablet (50 mg total) by mouth every 6 (six) hours as needed. 30 tablet 0   No current facility-administered medications for this visit.    LABS/IMAGING: No results found for this or any previous visit (from the past 48 hour(s)). No results found.  VITALS: BP 144/80 mmHg  Pulse 67  Ht 5' (1.524 m)  Wt 144 lb (65.318 kg)  BMI 28.12 kg/m2  EXAM: Deferred  EKG: Deferred  ASSESSMENT: 1. Chest pain - negative nuclear stress test for ischemia - EF 63% 2. Recent upper extremity DVT associated with a port-status post 3 weeks of Xarelto, now back on  Plavix 3. NSVT postop 4. Normal LV function on echo  PLAN: 1.   Tammy Hughes had a negative nuclear stress test for ischemia. Her chest pain is resolved. I recommend she continue on Plavix, as she has an aspirin allergy. Her DVT has resolved. We'll plan to see her back in 6 months or sooner as necessary.  Pixie Casino, MD, [facility] Attending Cardiologist [place] 01/11/2015, 10:21 AM

## 2015-01-11 NOTE — Patient Instructions (Signed)
Your physician wants you to follow-up in: 6 months with Dr. Hilty. You will receive a reminder letter in the mail two months in advance. If you don't receive a letter, please call our office to schedule the follow-up appointment.    

## 2015-01-12 ENCOUNTER — Telehealth: Payer: Self-pay | Admitting: Endocrinology

## 2015-01-12 NOTE — Telephone Encounter (Signed)
Tammy Hughes # [id number] They are requesting for one time a week for one week to reeval for PT her current orders are out

## 2015-01-12 NOTE — Telephone Encounter (Signed)
See note below and please advise if ok to give verbal orders.

## 2015-01-13 DIAGNOSIS — F329 Major depressive disorder, single episode, unspecified: Secondary | ICD-10-CM | POA: Diagnosis not present

## 2015-01-13 DIAGNOSIS — I10 Essential (primary) hypertension: Secondary | ICD-10-CM | POA: Diagnosis not present

## 2015-01-13 DIAGNOSIS — C169 Malignant neoplasm of stomach, unspecified: Secondary | ICD-10-CM | POA: Diagnosis not present

## 2015-01-13 DIAGNOSIS — I472 Ventricular tachycardia: Secondary | ICD-10-CM | POA: Diagnosis not present

## 2015-01-13 DIAGNOSIS — E1143 Type 2 diabetes mellitus with diabetic autonomic (poly)neuropathy: Secondary | ICD-10-CM | POA: Diagnosis not present

## 2015-01-13 DIAGNOSIS — M810 Age-related osteoporosis without current pathological fracture: Secondary | ICD-10-CM | POA: Diagnosis not present

## 2015-01-13 NOTE — Telephone Encounter (Signed)
ok 

## 2015-01-13 NOTE — Telephone Encounter (Signed)
Left voicemail advising of verbal orders for PT. Requested call back if the PT needs to discuss orders.

## 2015-01-17 DIAGNOSIS — E1143 Type 2 diabetes mellitus with diabetic autonomic (poly)neuropathy: Secondary | ICD-10-CM | POA: Diagnosis not present

## 2015-01-17 DIAGNOSIS — C169 Malignant neoplasm of stomach, unspecified: Secondary | ICD-10-CM | POA: Diagnosis not present

## 2015-01-17 DIAGNOSIS — M810 Age-related osteoporosis without current pathological fracture: Secondary | ICD-10-CM | POA: Diagnosis not present

## 2015-01-17 DIAGNOSIS — I472 Ventricular tachycardia: Secondary | ICD-10-CM | POA: Diagnosis not present

## 2015-01-17 DIAGNOSIS — F329 Major depressive disorder, single episode, unspecified: Secondary | ICD-10-CM | POA: Diagnosis not present

## 2015-01-17 DIAGNOSIS — I10 Essential (primary) hypertension: Secondary | ICD-10-CM | POA: Diagnosis not present

## 2015-01-19 ENCOUNTER — Telehealth: Payer: Self-pay | Admitting: Oncology

## 2015-01-19 ENCOUNTER — Ambulatory Visit (HOSPITAL_BASED_OUTPATIENT_CLINIC_OR_DEPARTMENT_OTHER): Payer: Medicare Other | Admitting: Oncology

## 2015-01-19 VITALS — BP 153/67 | HR 69 | Temp 98.4°F | Resp 18 | Ht 60.0 in | Wt 144.9 lb

## 2015-01-19 DIAGNOSIS — C169 Malignant neoplasm of stomach, unspecified: Secondary | ICD-10-CM

## 2015-01-19 DIAGNOSIS — Z86718 Personal history of other venous thrombosis and embolism: Secondary | ICD-10-CM

## 2015-01-19 DIAGNOSIS — C165 Malignant neoplasm of lesser curvature of stomach, unspecified: Secondary | ICD-10-CM | POA: Diagnosis not present

## 2015-01-19 NOTE — Telephone Encounter (Signed)
Pt confirmed labs/ov per 07/07 POF, gave pt AVS and Calendar.... KJ, lft msg with Stephanie at [facility] to contact pt to schedule this established pt to have her port removed by Dr. Barry Dienes... KJ

## 2015-01-19 NOTE — Progress Notes (Signed)
  [facility] OFFICE PROGRESS NOTE   Diagnosis: Gastric cancer  INTERVAL HISTORY:   Tammy Hughes reports feeling much better. She has been improved energy level. The skin changes have resolved. She is eating. She takes 3 cans of feeding supplement per the jejunostomy tube daily. She would like to have the tube removed.  Objective:  Vital signs in last 24 hours:  Blood pressure 153/67, pulse 69, temperature 98.4 F (36.9 C), temperature source Oral, resp. rate 18, height 5' (1.524 m), weight 144 lb 14.4 oz (65.726 kg), SpO2 97 %.    HEENT: Neck without mass Lymphatics: No cervical, supra-clavicular, or axillary nodes Resp: Diffuse inspiratory rales Cardio: Regular rate and rhythm GI: No hepatosplenomegaly, no mass, mild tenderness in left upper quadrant, jejunostomy tube site with granulation tissue with a skin exit, no surrounding erythema Vascular: No leg edema  Skin: Mild skin thickening at the hands   Medications: I have reviewed the patient's current medications.  Assessment/Plan: 1. Gastric cancer, status post an endoscopic biopsy of a lesser curvature mass on 05/31/2014 confirming adenocarcinoma  Staging CTs of the chest and abdomen on 06/08/2014 revealed no evidence of metastatic disease  Proximal gastrectomy and feeding jejunostomy 07/20/2014, pT2,pN1  Adjuvant weekly 5-Fu/leucovorin 09/14/2014, 09/21/2014, 09/28/2014 and 10/05/2014  Initiation of radiation and infusional 5-FU 10/17/2014  5-fluorouracil pump discontinued on 10/27/2014 due to toxicity.  Radiation discontinued 11/02/2014  2. Pulmonary fibrosis 3. Esophageal stricture, status post a dilatation procedure 05/31/2014 4. Diabetes 5. Postoperative ventricle tachycardia, NSTEMI January 2016 6. Hospital-acquired pneumonia January 2016 7. CT abdomen/pelvis 09/02/2014 with postsurgical changes. Focal fluid collection in the midline abutting the distal greater curvature of the stomach measuring  2.2 x 2.9 x 3.4 cm. Mild stranding in the adjacent fat. 8. PICC placement 10/17/2014 9. Rash. Most likely related to the 5-fluorouracil. Resolved. 10. Right upper extremity DVT 10/25/2014. On Xarelto. Three-month course planned. 11. Hospitalization 10/27/2014 through 10/28/2014 nausea/vomiting and left arm discomfort. Noted to have skin toxicity from the 5-fluorouracil. The 5-fluorouracil was discontinued.  Disposition:  Tammy Hughes is in clinical remission from gastric cancer. Her performance status appears much improved. We will refer her to Dr. Barry Dienes to consider removal of the  feeding tube. Tammy Hughes will return for an office visit in 3 months.  [name], MD  01/19/2015  12:39 PM

## 2015-01-20 ENCOUNTER — Telehealth: Payer: Self-pay | Admitting: Endocrinology

## 2015-01-20 DIAGNOSIS — C169 Malignant neoplasm of stomach, unspecified: Secondary | ICD-10-CM | POA: Diagnosis not present

## 2015-01-20 DIAGNOSIS — F329 Major depressive disorder, single episode, unspecified: Secondary | ICD-10-CM | POA: Diagnosis not present

## 2015-01-20 DIAGNOSIS — E1143 Type 2 diabetes mellitus with diabetic autonomic (poly)neuropathy: Secondary | ICD-10-CM | POA: Diagnosis not present

## 2015-01-20 DIAGNOSIS — M810 Age-related osteoporosis without current pathological fracture: Secondary | ICD-10-CM | POA: Diagnosis not present

## 2015-01-20 DIAGNOSIS — I472 Ventricular tachycardia: Secondary | ICD-10-CM | POA: Diagnosis not present

## 2015-01-20 DIAGNOSIS — I10 Essential (primary) hypertension: Secondary | ICD-10-CM | POA: Diagnosis not present

## 2015-01-20 NOTE — Telephone Encounter (Signed)
See below and please advise, Thanks!  

## 2015-01-20 NOTE — Telephone Encounter (Signed)
ok 

## 2015-01-20 NOTE — Telephone Encounter (Signed)
[facility] advised of verbal ok. They stated they are going to try washing the feeding tube out with productus found in the home.

## 2015-01-20 NOTE — Telephone Encounter (Signed)
[facility] needs orders for prn visit so they can see her today for possible clogged feeding tube [phone]

## 2015-01-21 ENCOUNTER — Other Ambulatory Visit: Payer: Self-pay | Admitting: Nurse Practitioner

## 2015-01-23 ENCOUNTER — Telehealth: Payer: Self-pay | Admitting: Endocrinology

## 2015-01-23 DIAGNOSIS — C169 Malignant neoplasm of stomach, unspecified: Secondary | ICD-10-CM

## 2015-01-23 MED ORDER — MIRTAZAPINE 15 MG PO TABS: 15.0000 mg | ORAL_TABLET | Freq: Every day | ORAL | Status: DC

## 2015-01-23 NOTE — Telephone Encounter (Signed)
Rx sent. And pt advised rx has been sent.

## 2015-01-23 NOTE — Telephone Encounter (Signed)
Please refill prn 

## 2015-01-23 NOTE — Telephone Encounter (Signed)
See note below and please advise. Remeron was filled under a different provider. Please advise if ok to refill. Thanks!

## 2015-01-23 NOTE — Telephone Encounter (Signed)
Patients daughter called and would like to know if her mothers medication was faxed to pharmacy  Rx: Remeron   Pharmacy: [facility]    Thank you

## 2015-01-24 DIAGNOSIS — I472 Ventricular tachycardia: Secondary | ICD-10-CM | POA: Diagnosis not present

## 2015-01-24 DIAGNOSIS — I10 Essential (primary) hypertension: Secondary | ICD-10-CM | POA: Diagnosis not present

## 2015-01-24 DIAGNOSIS — M810 Age-related osteoporosis without current pathological fracture: Secondary | ICD-10-CM | POA: Diagnosis not present

## 2015-01-24 DIAGNOSIS — E1143 Type 2 diabetes mellitus with diabetic autonomic (poly)neuropathy: Secondary | ICD-10-CM | POA: Diagnosis not present

## 2015-01-24 DIAGNOSIS — C169 Malignant neoplasm of stomach, unspecified: Secondary | ICD-10-CM | POA: Diagnosis not present

## 2015-01-24 DIAGNOSIS — F329 Major depressive disorder, single episode, unspecified: Secondary | ICD-10-CM | POA: Diagnosis not present

## 2015-01-25 ENCOUNTER — Encounter: Payer: Self-pay | Admitting: Endocrinology

## 2015-01-25 ENCOUNTER — Ambulatory Visit (INDEPENDENT_AMBULATORY_CARE_PROVIDER_SITE_OTHER): Payer: Medicare Other | Admitting: Endocrinology

## 2015-01-25 VITALS — BP 140/74 | HR 63 | Temp 97.2°F | Resp 16 | Ht 60.0 in | Wt 142.0 lb

## 2015-01-25 DIAGNOSIS — R109 Unspecified abdominal pain: Secondary | ICD-10-CM | POA: Diagnosis not present

## 2015-01-25 DIAGNOSIS — E1140 Type 2 diabetes mellitus with diabetic neuropathy, unspecified: Secondary | ICD-10-CM | POA: Diagnosis not present

## 2015-01-25 DIAGNOSIS — K7581 Nonalcoholic steatohepatitis (NASH): Secondary | ICD-10-CM

## 2015-01-25 DIAGNOSIS — E1142 Type 2 diabetes mellitus with diabetic polyneuropathy: Secondary | ICD-10-CM

## 2015-01-25 LAB — URINALYSIS, ROUTINE W REFLEX MICROSCOPIC
BILIRUBIN URINE: NEGATIVE
Hgb urine dipstick: NEGATIVE
Ketones, ur: NEGATIVE
Nitrite: NEGATIVE
PH: 5.5 (ref 5.0–8.0)
Specific Gravity, Urine: 1.02 (ref 1.000–1.030)
URINE GLUCOSE: NEGATIVE
Urobilinogen, UA: 0.2 (ref 0.0–1.0)

## 2015-01-25 LAB — POCT GLYCOSYLATED HEMOGLOBIN (HGB A1C): HEMOGLOBIN A1C: 7.1

## 2015-01-25 MED ORDER — INSULIN ASPART PROT & ASPART (70-30 MIX) 100 UNIT/ML PEN: 38.0000 [IU] | PEN_INJECTOR | Freq: Two times a day (BID) | SUBCUTANEOUS | Status: DC

## 2015-01-25 NOTE — Patient Instructions (Addendum)
check your blood sugar twice a day.  vary the time of day when you check, between before the 3 meals, and at bedtime.  also check if you have symptoms of your blood sugar being too high or too low.  please keep a record of the readings and bring it to your next appointment here.  You can write it on any piece of paper.  please call us sooner if your blood sugar goes below 70, or if you have a lot of readings over 200.   Please call when you need a refill of your pain medication, or for any other need.   Please come back for a follow-up appointment in 2 months.   Please decrease the insulin to 38 units twice a day (with first and last meals of the day).   On this type of insulin schedule, you should eat meals on a regular schedule.  If a meal is missed or significantly delayed, your blood sugar could go low.

## 2015-01-25 NOTE — Progress Notes (Signed)
Subjective:    Patient ID: Tammy Hughes, female    DOB: [birth year], 81 y.o.   MRN: [id number]  HPI Pt returns for f/u of diabetes mellitus: DM type: Insulin-requiring type 2 Dx'ed: 9983 Complications: polyneuropathy.   Therapy: insulin since 2005 GDM: never DKA: never Severe hypoglycemia: never Pancreatitis: never Other: she chose bid premixed insulin; She takes tube-feeding overnight. Interval history: hx is from pt and husband, who says she takes 40 units bid.  no cbg record, but he states cbg's vary from 53-200.  There is no trend throughout the day.  Husband says pt stopped tube-feeding 1 week ago.   Past Medical History  Diagnosis Date  . HYPERCHOLESTEROLEMIA 02/01/2008  . ANXIETY 03/08/2008  . PERIPHERAL NEUROPATHY 02/23/2007  . HYPERTENSION 03/08/2008  . GERD 02/23/2007  . DIVERTICULOSIS, COLON 03/08/2008  . OSTEOARTHRITIS 02/23/2007  . OSTEOPOROSIS 02/23/2007  . Urolithiasis   . Allergy     SEASONAL  . Cataract     REMOVED BILATERAL  . Depression   . Stomach cancer 07/20/14    invasive adenocarcinoma w/signet rings features  . DIABETES MELLITUS, TYPE I 02/23/2007  . DVT (deep venous thrombosis)     Past Surgical History  Procedure Laterality Date  . Cholecystectomy  1995  . Esophagogastroduodenoscopy  06/08/2002  . Gastrectomy N/A 07/20/2014    Procedure: PROXIMAL GASTRECTOMY;  Surgeon: Stark Klein, MD;  Location: [place];  Service: General;  Laterality: N/A;  . Laparoscopy N/A 07/20/2014    Procedure: LAPAROSCOPY DIAGNOSTIC;  Surgeon: Stark Klein, MD;  Location: [place];  Service: General;  Laterality: N/A;  . Gastrojejunostomy N/A 07/20/2014    Procedure: GASTROJEJUNOSTOMY;  Surgeon: Stark Klein, MD;  Location: [place];  Service: General;  Laterality: N/A;    History   Social History  . Marital Status: Married    Spouse Name: N/A  . Number of Children: N/A  . Years of Education: N/A   Occupational History  . Retired    Social History Main Topics  . Smoking  status: Never Smoker   . Smokeless tobacco: Never Used  . Alcohol Use: No  . Drug Use: No  . Sexual Activity: Not on file   Other Topics Concern  . Not on file   Social History Narrative   Dentist          Current Outpatient Prescriptions on File Prior to Visit  Medication Sig Dispense Refill  . Alum & Mag Hydroxide-Simeth (MAGIC MOUTHWASH W/LIDOCAINE) SOLN Take 5 mLs by mouth 4 (four) times daily as needed for mouth pain. 120 mL 1  . clopidogrel (PLAVIX) 75 MG tablet Take 1 tablet (75 mg total) by mouth daily. 90 tablet 1  . esomeprazole (NEXIUM) 40 MG capsule Take 1 capsule (40 mg total) by mouth daily at 12 noon. 90 capsule 2  . hyaluronate sodium (RADIAPLEXRX) GEL Apply 1 application topically daily.    Marland Kitchen HYDROcodone-acetaminophen (HYCET) 7.5-325 mg/15 ml solution Take 15 mLs by mouth every 4 (four) hours as needed for moderate pain. 473 mL 0  . LORazepam (ATIVAN) 1 MG tablet Take 1 mg by mouth at bedtime. As needed for sleep    . metoprolol tartrate (LOPRESSOR) 25 MG tablet Take 1 tablet (25 mg total) by mouth 2 (two) times daily. 180 tablet 1  . mirtazapine (REMERON) 15 MG tablet Take 1 tablet (15 mg total) by mouth at bedtime. 30 tablet 3  . promethazine (PHENERGAN) 12.5 MG tablet Take 1 tablet (12.5 mg total) by mouth  every 4 (four) hours as needed for nausea or vomiting. 50 tablet 2  . rosuvastatin (CRESTOR) 10 MG tablet Take 1 tablet (10 mg total) by mouth daily. 90 tablet 3  . traMADol (ULTRAM) 50 MG tablet Take 1 tablet (50 mg total) by mouth every 6 (six) hours as needed. 30 tablet 0   No current facility-administered medications on file prior to visit.    Allergies  Allergen Reactions  . Pirfenidone Diarrhea and Nausea And Vomiting  . Aspirin     rash    Family History  Problem Relation Age of Onset  . Cancer Father     Prostate Cancer  . Cancer Sister     Breast Cancer  . Diabetes Sister   . Diabetes Sister     BP 140/74 mmHg  Pulse  63  Temp(Src) 97.2 F (36.2 C) (Oral)  Resp 16  Ht 5' (1.524 m)  Wt 142 lb (64.411 kg)  BMI 27.73 kg/m2  SpO2 96%  Review of Systems Appetite is still poor, but no weight change.  No n/v/abd pain.      Objective:   Physical Exam VITAL SIGNS:  See vs page GENERAL: no distress Pulses: dorsalis pedis intact bilat.   MSK: no deformity of the feet CV: no leg edema Skin:  no ulcer on the feet.  normal color and temp on the feet. Neuro: sensation is intact to touch on the feet, but decreased from normal.     A1c=7.1%    Assessment & Plan:  DM: overcontrolled, given this regimen, which does match insulin to her changing needs throughout the day.   Poor appetite: we'll check albumin today. As weight [place] unchanged  Patient is advised the following: Patient Instructions  check your blood sugar twice a day.  vary the time of day when you check, between before the 3 meals, and at bedtime.  also check if you have symptoms of your blood sugar being too high or too low.  please keep a record of the readings and bring it to your next appointment here.  You can write it on any piece of paper.  please call us sooner if your blood sugar goes below 70, or if you have a lot of readings over 200.   Please call when you need a refill of your pain medication, or for any other need.   Please come back for a follow-up appointment in 2 months.   Please decrease the insulin to 38 units twice a day (with first and last meals of the day).   On this type of insulin schedule, you should eat meals on a regular schedule.  If a meal is missed or significantly delayed, your blood sugar could go low.

## 2015-01-26 DIAGNOSIS — M810 Age-related osteoporosis without current pathological fracture: Secondary | ICD-10-CM | POA: Diagnosis not present

## 2015-01-26 DIAGNOSIS — I10 Essential (primary) hypertension: Secondary | ICD-10-CM | POA: Diagnosis not present

## 2015-01-26 DIAGNOSIS — E1143 Type 2 diabetes mellitus with diabetic autonomic (poly)neuropathy: Secondary | ICD-10-CM | POA: Diagnosis not present

## 2015-01-26 DIAGNOSIS — I472 Ventricular tachycardia: Secondary | ICD-10-CM | POA: Diagnosis not present

## 2015-01-26 DIAGNOSIS — F329 Major depressive disorder, single episode, unspecified: Secondary | ICD-10-CM | POA: Diagnosis not present

## 2015-01-26 DIAGNOSIS — C169 Malignant neoplasm of stomach, unspecified: Secondary | ICD-10-CM | POA: Diagnosis not present

## 2015-01-26 LAB — HEPATIC FUNCTION PANEL
ALBUMIN: 3.9 g/dL (ref 3.5–5.2)
ALK PHOS: 78 U/L (ref 39–117)
ALT: 19 U/L (ref 0–35)
AST: 33 U/L (ref 0–37)
BILIRUBIN DIRECT: 0 mg/dL (ref 0.0–0.3)
BILIRUBIN TOTAL: 0.2 mg/dL (ref 0.2–1.2)
Total Protein: 8.7 g/dL — ABNORMAL HIGH (ref 6.0–8.3)

## 2015-01-26 LAB — HEMOGLOBIN A1C: HEMOGLOBIN A1C: 6.7 % — AB (ref 4.6–6.5)

## 2015-01-30 ENCOUNTER — Telehealth: Payer: Self-pay | Admitting: Endocrinology

## 2015-01-30 ENCOUNTER — Other Ambulatory Visit: Payer: Self-pay

## 2015-01-30 MED ORDER — INSULIN PEN NEEDLE 31G X 5 MM MISC: Status: DC

## 2015-01-30 MED ORDER — GLUCOSE BLOOD VI STRP: ORAL_STRIP | Status: DC

## 2015-01-30 NOTE — Telephone Encounter (Signed)
[phone] [facility]-calling for a new rx to see if we can rx either 6 mm or 8 mm needles

## 2015-01-30 NOTE — Telephone Encounter (Signed)
I contacted [facility] and gave the verbal order to change the pen needle size to 7mm.

## 2015-01-31 DIAGNOSIS — M810 Age-related osteoporosis without current pathological fracture: Secondary | ICD-10-CM | POA: Diagnosis not present

## 2015-01-31 DIAGNOSIS — C169 Malignant neoplasm of stomach, unspecified: Secondary | ICD-10-CM | POA: Diagnosis not present

## 2015-01-31 DIAGNOSIS — F329 Major depressive disorder, single episode, unspecified: Secondary | ICD-10-CM | POA: Diagnosis not present

## 2015-01-31 DIAGNOSIS — I10 Essential (primary) hypertension: Secondary | ICD-10-CM | POA: Diagnosis not present

## 2015-01-31 DIAGNOSIS — I472 Ventricular tachycardia: Secondary | ICD-10-CM | POA: Diagnosis not present

## 2015-01-31 DIAGNOSIS — E1143 Type 2 diabetes mellitus with diabetic autonomic (poly)neuropathy: Secondary | ICD-10-CM | POA: Diagnosis not present

## 2015-02-02 DIAGNOSIS — F329 Major depressive disorder, single episode, unspecified: Secondary | ICD-10-CM | POA: Diagnosis not present

## 2015-02-02 DIAGNOSIS — I472 Ventricular tachycardia: Secondary | ICD-10-CM | POA: Diagnosis not present

## 2015-02-02 DIAGNOSIS — E1143 Type 2 diabetes mellitus with diabetic autonomic (poly)neuropathy: Secondary | ICD-10-CM | POA: Diagnosis not present

## 2015-02-02 DIAGNOSIS — I10 Essential (primary) hypertension: Secondary | ICD-10-CM | POA: Diagnosis not present

## 2015-02-02 DIAGNOSIS — C169 Malignant neoplasm of stomach, unspecified: Secondary | ICD-10-CM | POA: Diagnosis not present

## 2015-02-02 DIAGNOSIS — M810 Age-related osteoporosis without current pathological fracture: Secondary | ICD-10-CM | POA: Diagnosis not present

## 2015-02-06 ENCOUNTER — Telehealth: Payer: Self-pay | Admitting: Oncology

## 2015-02-06 DIAGNOSIS — F329 Major depressive disorder, single episode, unspecified: Secondary | ICD-10-CM | POA: Diagnosis not present

## 2015-02-06 DIAGNOSIS — E1143 Type 2 diabetes mellitus with diabetic autonomic (poly)neuropathy: Secondary | ICD-10-CM | POA: Diagnosis not present

## 2015-02-06 DIAGNOSIS — C169 Malignant neoplasm of stomach, unspecified: Secondary | ICD-10-CM | POA: Diagnosis not present

## 2015-02-06 DIAGNOSIS — I472 Ventricular tachycardia: Secondary | ICD-10-CM | POA: Diagnosis not present

## 2015-02-06 DIAGNOSIS — M810 Age-related osteoporosis without current pathological fracture: Secondary | ICD-10-CM | POA: Diagnosis not present

## 2015-02-06 DIAGNOSIS — I10 Essential (primary) hypertension: Secondary | ICD-10-CM | POA: Diagnosis not present

## 2015-02-06 NOTE — Telephone Encounter (Signed)
Faxed pt office notes to Dr. Barry Dienes

## 2015-02-07 DIAGNOSIS — C169 Malignant neoplasm of stomach, unspecified: Secondary | ICD-10-CM | POA: Diagnosis not present

## 2015-02-07 DIAGNOSIS — I10 Essential (primary) hypertension: Secondary | ICD-10-CM | POA: Diagnosis not present

## 2015-02-07 DIAGNOSIS — F329 Major depressive disorder, single episode, unspecified: Secondary | ICD-10-CM | POA: Diagnosis not present

## 2015-02-07 DIAGNOSIS — E1143 Type 2 diabetes mellitus with diabetic autonomic (poly)neuropathy: Secondary | ICD-10-CM | POA: Diagnosis not present

## 2015-02-07 DIAGNOSIS — I472 Ventricular tachycardia: Secondary | ICD-10-CM | POA: Diagnosis not present

## 2015-02-07 DIAGNOSIS — M810 Age-related osteoporosis without current pathological fracture: Secondary | ICD-10-CM | POA: Diagnosis not present

## 2015-02-08 DIAGNOSIS — I10 Essential (primary) hypertension: Secondary | ICD-10-CM | POA: Diagnosis not present

## 2015-02-08 DIAGNOSIS — C169 Malignant neoplasm of stomach, unspecified: Secondary | ICD-10-CM | POA: Diagnosis not present

## 2015-02-08 DIAGNOSIS — E1143 Type 2 diabetes mellitus with diabetic autonomic (poly)neuropathy: Secondary | ICD-10-CM | POA: Diagnosis not present

## 2015-02-08 DIAGNOSIS — M810 Age-related osteoporosis without current pathological fracture: Secondary | ICD-10-CM | POA: Diagnosis not present

## 2015-02-08 DIAGNOSIS — F329 Major depressive disorder, single episode, unspecified: Secondary | ICD-10-CM | POA: Diagnosis not present

## 2015-02-08 DIAGNOSIS — I472 Ventricular tachycardia: Secondary | ICD-10-CM | POA: Diagnosis not present

## 2015-02-09 DIAGNOSIS — E1143 Type 2 diabetes mellitus with diabetic autonomic (poly)neuropathy: Secondary | ICD-10-CM | POA: Diagnosis not present

## 2015-02-09 DIAGNOSIS — C169 Malignant neoplasm of stomach, unspecified: Secondary | ICD-10-CM | POA: Diagnosis not present

## 2015-02-09 DIAGNOSIS — F329 Major depressive disorder, single episode, unspecified: Secondary | ICD-10-CM | POA: Diagnosis not present

## 2015-02-09 DIAGNOSIS — I472 Ventricular tachycardia: Secondary | ICD-10-CM | POA: Diagnosis not present

## 2015-02-09 DIAGNOSIS — M810 Age-related osteoporosis without current pathological fracture: Secondary | ICD-10-CM | POA: Diagnosis not present

## 2015-02-09 DIAGNOSIS — I10 Essential (primary) hypertension: Secondary | ICD-10-CM | POA: Diagnosis not present

## 2015-02-10 ENCOUNTER — Telehealth: Payer: Self-pay | Admitting: Endocrinology

## 2015-02-10 NOTE — Telephone Encounter (Signed)
Patient daughter called stating that Mrs. Ragin' blood pressure has been high    Complains of being light headed and headaches   Please advise    Thank you

## 2015-02-10 NOTE — Telephone Encounter (Signed)
Pt was advised to take her blood pressure medication every night and every morning as instructed.Also asked pt to check blood pressure once in the morning and once at night over the weekend and call back with the readings on Monday.

## 2015-02-13 DIAGNOSIS — E1143 Type 2 diabetes mellitus with diabetic autonomic (poly)neuropathy: Secondary | ICD-10-CM | POA: Diagnosis not present

## 2015-02-13 DIAGNOSIS — C169 Malignant neoplasm of stomach, unspecified: Secondary | ICD-10-CM | POA: Diagnosis not present

## 2015-02-13 DIAGNOSIS — I472 Ventricular tachycardia: Secondary | ICD-10-CM | POA: Diagnosis not present

## 2015-02-13 DIAGNOSIS — F329 Major depressive disorder, single episode, unspecified: Secondary | ICD-10-CM | POA: Diagnosis not present

## 2015-02-13 DIAGNOSIS — M810 Age-related osteoporosis without current pathological fracture: Secondary | ICD-10-CM | POA: Diagnosis not present

## 2015-02-13 DIAGNOSIS — I10 Essential (primary) hypertension: Secondary | ICD-10-CM | POA: Diagnosis not present

## 2015-02-14 DIAGNOSIS — C169 Malignant neoplasm of stomach, unspecified: Secondary | ICD-10-CM | POA: Diagnosis not present

## 2015-02-14 DIAGNOSIS — M810 Age-related osteoporosis without current pathological fracture: Secondary | ICD-10-CM | POA: Diagnosis not present

## 2015-02-14 DIAGNOSIS — F329 Major depressive disorder, single episode, unspecified: Secondary | ICD-10-CM | POA: Diagnosis not present

## 2015-02-14 DIAGNOSIS — I472 Ventricular tachycardia: Secondary | ICD-10-CM | POA: Diagnosis not present

## 2015-02-14 DIAGNOSIS — E1143 Type 2 diabetes mellitus with diabetic autonomic (poly)neuropathy: Secondary | ICD-10-CM | POA: Diagnosis not present

## 2015-02-14 DIAGNOSIS — I10 Essential (primary) hypertension: Secondary | ICD-10-CM | POA: Diagnosis not present

## 2015-02-16 DIAGNOSIS — C169 Malignant neoplasm of stomach, unspecified: Secondary | ICD-10-CM | POA: Diagnosis not present

## 2015-02-16 DIAGNOSIS — I10 Essential (primary) hypertension: Secondary | ICD-10-CM | POA: Diagnosis not present

## 2015-02-16 DIAGNOSIS — M810 Age-related osteoporosis without current pathological fracture: Secondary | ICD-10-CM | POA: Diagnosis not present

## 2015-02-16 DIAGNOSIS — F329 Major depressive disorder, single episode, unspecified: Secondary | ICD-10-CM | POA: Diagnosis not present

## 2015-02-16 DIAGNOSIS — E1143 Type 2 diabetes mellitus with diabetic autonomic (poly)neuropathy: Secondary | ICD-10-CM | POA: Diagnosis not present

## 2015-02-16 DIAGNOSIS — I472 Ventricular tachycardia: Secondary | ICD-10-CM | POA: Diagnosis not present

## 2015-02-17 ENCOUNTER — Telehealth: Payer: Self-pay | Admitting: Endocrinology

## 2015-02-17 DIAGNOSIS — M810 Age-related osteoporosis without current pathological fracture: Secondary | ICD-10-CM | POA: Diagnosis not present

## 2015-02-17 DIAGNOSIS — F329 Major depressive disorder, single episode, unspecified: Secondary | ICD-10-CM | POA: Diagnosis not present

## 2015-02-17 DIAGNOSIS — E1143 Type 2 diabetes mellitus with diabetic autonomic (poly)neuropathy: Secondary | ICD-10-CM | POA: Diagnosis not present

## 2015-02-17 DIAGNOSIS — I10 Essential (primary) hypertension: Secondary | ICD-10-CM | POA: Diagnosis not present

## 2015-02-17 DIAGNOSIS — C169 Malignant neoplasm of stomach, unspecified: Secondary | ICD-10-CM | POA: Diagnosis not present

## 2015-02-17 DIAGNOSIS — I472 Ventricular tachycardia: Secondary | ICD-10-CM | POA: Diagnosis not present

## 2015-02-17 MED ORDER — GLUCOSE BLOOD VI STRP: ORAL_STRIP | Status: DC

## 2015-02-17 NOTE — Telephone Encounter (Signed)
Rx sent with Dx code.

## 2015-02-17 NOTE — Telephone Encounter (Addendum)
Pt's duaghter advised the rx has been resubmitted with the dx's code.

## 2015-02-17 NOTE — Telephone Encounter (Signed)
Pharmacy needs redo on rx for the contour test strips needs diagnosis

## 2015-02-17 NOTE — Telephone Encounter (Signed)
Patients daughter called stating that her Rx is missing a diagnosis code   Rx: test strips  Please advise

## 2015-02-20 ENCOUNTER — Telehealth: Payer: Self-pay

## 2015-02-20 DIAGNOSIS — F329 Major depressive disorder, single episode, unspecified: Secondary | ICD-10-CM | POA: Diagnosis not present

## 2015-02-20 DIAGNOSIS — E1143 Type 2 diabetes mellitus with diabetic autonomic (poly)neuropathy: Secondary | ICD-10-CM | POA: Diagnosis not present

## 2015-02-20 DIAGNOSIS — I472 Ventricular tachycardia: Secondary | ICD-10-CM | POA: Diagnosis not present

## 2015-02-20 DIAGNOSIS — I10 Essential (primary) hypertension: Secondary | ICD-10-CM | POA: Diagnosis not present

## 2015-02-20 DIAGNOSIS — M810 Age-related osteoporosis without current pathological fracture: Secondary | ICD-10-CM | POA: Diagnosis not present

## 2015-02-20 DIAGNOSIS — C169 Malignant neoplasm of stomach, unspecified: Secondary | ICD-10-CM | POA: Diagnosis not present

## 2015-02-20 NOTE — Telephone Encounter (Signed)
Pt understands that these blood sugars are actually okay and to continue with the same medications.

## 2015-02-20 NOTE — Telephone Encounter (Signed)
Pt daughter called in stating that her mother blood sugars are too high Please advise below:(Morning blood sugar readings) 08/01/02016 186 02/14/2015 160 02/15/2015 132 02/16/2015 133 02/17/2015 140 02/18/2015 130 02/19/2015 112 02/20/2015 160  Pt daughter called in stating her mother blood pressure was too high Please advise below: 08/02016 142/70 02/19/2015 130/175 02/18/2015 123/198

## 2015-02-20 NOTE — Telephone Encounter (Signed)
Reading these numbers, these are pretty good. Please continue the same medications

## 2015-02-21 DIAGNOSIS — M810 Age-related osteoporosis without current pathological fracture: Secondary | ICD-10-CM | POA: Diagnosis not present

## 2015-02-21 DIAGNOSIS — E1143 Type 2 diabetes mellitus with diabetic autonomic (poly)neuropathy: Secondary | ICD-10-CM | POA: Diagnosis not present

## 2015-02-21 DIAGNOSIS — I10 Essential (primary) hypertension: Secondary | ICD-10-CM | POA: Diagnosis not present

## 2015-02-21 DIAGNOSIS — F329 Major depressive disorder, single episode, unspecified: Secondary | ICD-10-CM | POA: Diagnosis not present

## 2015-02-21 DIAGNOSIS — I472 Ventricular tachycardia: Secondary | ICD-10-CM | POA: Diagnosis not present

## 2015-02-21 DIAGNOSIS — C169 Malignant neoplasm of stomach, unspecified: Secondary | ICD-10-CM | POA: Diagnosis not present

## 2015-02-22 DIAGNOSIS — E1143 Type 2 diabetes mellitus with diabetic autonomic (poly)neuropathy: Secondary | ICD-10-CM | POA: Diagnosis not present

## 2015-02-22 DIAGNOSIS — I10 Essential (primary) hypertension: Secondary | ICD-10-CM | POA: Diagnosis not present

## 2015-02-22 DIAGNOSIS — F329 Major depressive disorder, single episode, unspecified: Secondary | ICD-10-CM | POA: Diagnosis not present

## 2015-02-22 DIAGNOSIS — M810 Age-related osteoporosis without current pathological fracture: Secondary | ICD-10-CM | POA: Diagnosis not present

## 2015-02-22 DIAGNOSIS — C169 Malignant neoplasm of stomach, unspecified: Secondary | ICD-10-CM | POA: Diagnosis not present

## 2015-02-22 DIAGNOSIS — I472 Ventricular tachycardia: Secondary | ICD-10-CM | POA: Diagnosis not present

## 2015-02-23 DIAGNOSIS — I472 Ventricular tachycardia: Secondary | ICD-10-CM | POA: Diagnosis not present

## 2015-02-23 DIAGNOSIS — C169 Malignant neoplasm of stomach, unspecified: Secondary | ICD-10-CM | POA: Diagnosis not present

## 2015-02-23 DIAGNOSIS — M810 Age-related osteoporosis without current pathological fracture: Secondary | ICD-10-CM | POA: Diagnosis not present

## 2015-02-23 DIAGNOSIS — F329 Major depressive disorder, single episode, unspecified: Secondary | ICD-10-CM | POA: Diagnosis not present

## 2015-02-23 DIAGNOSIS — E1143 Type 2 diabetes mellitus with diabetic autonomic (poly)neuropathy: Secondary | ICD-10-CM | POA: Diagnosis not present

## 2015-02-23 DIAGNOSIS — I10 Essential (primary) hypertension: Secondary | ICD-10-CM | POA: Diagnosis not present

## 2015-02-28 ENCOUNTER — Telehealth: Payer: Self-pay | Admitting: Endocrinology

## 2015-02-28 DIAGNOSIS — C169 Malignant neoplasm of stomach, unspecified: Secondary | ICD-10-CM | POA: Diagnosis not present

## 2015-02-28 DIAGNOSIS — M810 Age-related osteoporosis without current pathological fracture: Secondary | ICD-10-CM | POA: Diagnosis not present

## 2015-02-28 DIAGNOSIS — I10 Essential (primary) hypertension: Secondary | ICD-10-CM | POA: Diagnosis not present

## 2015-02-28 DIAGNOSIS — I472 Ventricular tachycardia: Secondary | ICD-10-CM | POA: Diagnosis not present

## 2015-02-28 DIAGNOSIS — F329 Major depressive disorder, single episode, unspecified: Secondary | ICD-10-CM | POA: Diagnosis not present

## 2015-02-28 DIAGNOSIS — E1143 Type 2 diabetes mellitus with diabetic autonomic (poly)neuropathy: Secondary | ICD-10-CM | POA: Diagnosis not present

## 2015-03-01 ENCOUNTER — Ambulatory Visit (INDEPENDENT_AMBULATORY_CARE_PROVIDER_SITE_OTHER): Payer: Medicare Other | Admitting: Endocrinology

## 2015-03-01 ENCOUNTER — Encounter: Payer: Self-pay | Admitting: Endocrinology

## 2015-03-01 VITALS — BP 104/70 | HR 57 | Temp 97.9°F | Ht 60.0 in | Wt 136.0 lb

## 2015-03-01 DIAGNOSIS — E119 Type 2 diabetes mellitus without complications: Secondary | ICD-10-CM

## 2015-03-01 MED ORDER — INSULIN ASPART PROT & ASPART (70-30 MIX) 100 UNIT/ML PEN: PEN_INJECTOR | SUBCUTANEOUS | Status: DC

## 2015-03-01 MED ORDER — MEGESTROL ACETATE 20 MG PO TABS: 20.0000 mg | ORAL_TABLET | Freq: Every day | ORAL | Status: DC

## 2015-03-01 NOTE — Patient Instructions (Addendum)
check your blood sugar twice a day.  vary the time of day when you check, between before the 3 meals, and at bedtime.  also check if you have symptoms of your blood sugar being too high or too low.  please keep a record of the readings and bring it to your next appointment here.  You can write it on any piece of paper.  please call us sooner if your blood sugar goes below 70, or if you have a lot of readings over 200.   Please call when you need a refill of your pain medication, or for any other need.   Please come back for a follow-up appointment in 2 months.   Please decrease the insulin to 40 units each morning and 20 units each evening.   On this type of insulin schedule, you should eat meals on a regular schedule.  If a meal is missed or significantly delayed, your blood sugar could go low.   i have sent a prescription to your pharmacy, for megace. Please take the pain medication as needed for the neck pain.

## 2015-03-01 NOTE — Progress Notes (Signed)
Subjective:    Patient ID: Tammy Hughes, female    DOB: September 24, 1932, 81 y.o.   MRN: [id number]  HPI Pt returns for f/u of diabetes mellitus: DM type: Insulin-requiring type 2 Dx'ed: 9767 Complications: polyneuropathy.   Therapy: insulin since 2005 GDM: never DKA: never Severe hypoglycemia: never Pancreatitis: never Other: she chose bid premixed insulin; She no longer takes tube-feeding. Interval history: hx is from pt and husband, who says she takes 38 units bid.  she brings a record of her cbg's which i have reviewed today.  It varies from 96-228.  It is in general higher as the day goes on.  She has few weeks of moderate pain at the left posterior neck, but no assoc numbness.  Past Medical History  Diagnosis Date  . HYPERCHOLESTEROLEMIA 02/01/2008  . ANXIETY 03/08/2008  . PERIPHERAL NEUROPATHY 02/23/2007  . HYPERTENSION 03/08/2008  . GERD 02/23/2007  . DIVERTICULOSIS, COLON 03/08/2008  . OSTEOARTHRITIS 02/23/2007  . OSTEOPOROSIS 02/23/2007  . Urolithiasis   . Allergy     SEASONAL  . Cataract     REMOVED BILATERAL  . Depression   . Stomach cancer 07/20/14    invasive adenocarcinoma w/signet rings features  . DIABETES MELLITUS, TYPE I 02/23/2007  . DVT (deep venous thrombosis)     Past Surgical History  Procedure Laterality Date  . Cholecystectomy  1995  . Esophagogastroduodenoscopy  06/08/2002  . Gastrectomy N/A 07/20/2014    Procedure: PROXIMAL GASTRECTOMY;  Surgeon: Stark Klein, MD;  Location: [facility];  Service: General;  Laterality: N/A;  . Laparoscopy N/A 07/20/2014    Procedure: LAPAROSCOPY DIAGNOSTIC;  Surgeon: Stark Klein, MD;  Location: [facility];  Service: General;  Laterality: N/A;  . Gastrojejunostomy N/A 07/20/2014    Procedure: GASTROJEJUNOSTOMY;  Surgeon: Stark Klein, MD;  Location: [facility];  Service: General;  Laterality: N/A;    Social History   Social History  . Marital Status: Married    Spouse Name: N/A  . Number of Children: N/A  . Years of Education: N/A    Occupational History  . Retired    Social History Main Topics  . Smoking status: Never Smoker   . Smokeless tobacco: Never Used  . Alcohol Use: No  . Drug Use: No  . Sexual Activity: Not on file   Other Topics Concern  . Not on file   Social History Narrative   Dentist          Current Outpatient Prescriptions on File Prior to Visit  Medication Sig Dispense Refill  . Alum & Mag Hydroxide-Simeth (MAGIC MOUTHWASH W/LIDOCAINE) SOLN Take 5 mLs by mouth 4 (four) times daily as needed for mouth pain. 120 mL 1  . clopidogrel (PLAVIX) 75 MG tablet Take 1 tablet (75 mg total) by mouth daily. 90 tablet 1  . esomeprazole (NEXIUM) 40 MG capsule Take 1 capsule (40 mg total) by mouth daily at 12 noon. 90 capsule 2  . glucose blood (BAYER CONTOUR TEST) test strip Use to check blood sugar 3 times per day. Dx Code E11.9 200 each 2  . hyaluronate sodium (RADIAPLEXRX) GEL Apply 1 application topically daily.    Marland Kitchen HYDROcodone-acetaminophen (HYCET) 7.5-325 mg/15 ml solution Take 15 mLs by mouth every 4 (four) hours as needed for moderate pain. 473 mL 0  . Insulin Pen Needle 31G X 5 MM MISC Use to inject insulin 3 times per day. 200 each 2  . LORazepam (ATIVAN) 1 MG tablet Take 1 mg by mouth at bedtime.  As needed for sleep    . metoprolol tartrate (LOPRESSOR) 25 MG tablet Take 1 tablet (25 mg total) by mouth 2 (two) times daily. 180 tablet 1  . mirtazapine (REMERON) 15 MG tablet Take 1 tablet (15 mg total) by mouth at bedtime. 30 tablet 3  . promethazine (PHENERGAN) 12.5 MG tablet Take 1 tablet (12.5 mg total) by mouth every 4 (four) hours as needed for nausea or vomiting. 50 tablet 2  . rosuvastatin (CRESTOR) 10 MG tablet Take 1 tablet (10 mg total) by mouth daily. 90 tablet 3  . traMADol (ULTRAM) 50 MG tablet Take 1 tablet (50 mg total) by mouth every 6 (six) hours as needed. 30 tablet 0   No current facility-administered medications on file prior to visit.    Allergies   Allergen Reactions  . Pirfenidone Diarrhea and Nausea And Vomiting  . Aspirin     rash    Family History  Problem Relation Age of Onset  . Cancer Father     Prostate Cancer  . Cancer Sister     Breast Cancer  . Diabetes Sister   . Diabetes Sister     BP 104/70 mmHg  Pulse 57  Temp(Src) 97.9 F (36.6 C) (Oral)  Ht 5' (1.524 m)  Wt 136 lb (61.689 kg)  BMI 26.56 kg/m2  SpO2 96%   Review of Systems She has lost more weight.  Depression is well-controlled    Objective:   Physical Exam VITAL SIGNS:  See vs page GENERAL: no distress Neck: full rom, but rom is painful.  Nontender Neuro: sensation is intact to touch on the LUE      Assessment & Plan:  Weight loss, persistent. DM: still overcontrolled. Neck strain, new.  She declines dx/rx.  Patient is advised the following: Patient Instructions  check your blood sugar twice a day.  vary the time of day when you check, between before the 3 meals, and at bedtime.  also check if you have symptoms of your blood sugar being too high or too low.  please keep a record of the readings and bring it to your next appointment here.  You can write it on any piece of paper.  please call us sooner if your blood sugar goes below 70, or if you have a lot of readings over 200.   Please call when you need a refill of your pain medication, or for any other need.   Please come back for a follow-up appointment in 2 months.   Please decrease the insulin to 40 units each morning and 20 units each evening.   On this type of insulin schedule, you should eat meals on a regular schedule.  If a meal is missed or significantly delayed, your blood sugar could go low.   i have sent a prescription to your pharmacy, for megace. Please take the pain medication as needed for the neck pain.

## 2015-03-02 DIAGNOSIS — I10 Essential (primary) hypertension: Secondary | ICD-10-CM | POA: Diagnosis not present

## 2015-03-02 DIAGNOSIS — I472 Ventricular tachycardia: Secondary | ICD-10-CM | POA: Diagnosis not present

## 2015-03-02 DIAGNOSIS — M810 Age-related osteoporosis without current pathological fracture: Secondary | ICD-10-CM | POA: Diagnosis not present

## 2015-03-02 DIAGNOSIS — C169 Malignant neoplasm of stomach, unspecified: Secondary | ICD-10-CM | POA: Diagnosis not present

## 2015-03-02 DIAGNOSIS — E1143 Type 2 diabetes mellitus with diabetic autonomic (poly)neuropathy: Secondary | ICD-10-CM | POA: Diagnosis not present

## 2015-03-02 DIAGNOSIS — F329 Major depressive disorder, single episode, unspecified: Secondary | ICD-10-CM | POA: Diagnosis not present

## 2015-03-03 DIAGNOSIS — C169 Malignant neoplasm of stomach, unspecified: Secondary | ICD-10-CM | POA: Diagnosis not present

## 2015-03-03 DIAGNOSIS — F329 Major depressive disorder, single episode, unspecified: Secondary | ICD-10-CM | POA: Diagnosis not present

## 2015-03-03 DIAGNOSIS — I10 Essential (primary) hypertension: Secondary | ICD-10-CM | POA: Diagnosis not present

## 2015-03-03 DIAGNOSIS — E1143 Type 2 diabetes mellitus with diabetic autonomic (poly)neuropathy: Secondary | ICD-10-CM | POA: Diagnosis not present

## 2015-03-03 DIAGNOSIS — M810 Age-related osteoporosis without current pathological fracture: Secondary | ICD-10-CM | POA: Diagnosis not present

## 2015-03-03 DIAGNOSIS — I472 Ventricular tachycardia: Secondary | ICD-10-CM | POA: Diagnosis not present

## 2015-03-03 NOTE — Telephone Encounter (Signed)
error 

## 2015-03-07 ENCOUNTER — Other Ambulatory Visit: Payer: Self-pay | Admitting: Endocrinology

## 2015-03-07 DIAGNOSIS — M810 Age-related osteoporosis without current pathological fracture: Secondary | ICD-10-CM | POA: Diagnosis not present

## 2015-03-07 DIAGNOSIS — C169 Malignant neoplasm of stomach, unspecified: Secondary | ICD-10-CM | POA: Diagnosis not present

## 2015-03-07 DIAGNOSIS — E1143 Type 2 diabetes mellitus with diabetic autonomic (poly)neuropathy: Secondary | ICD-10-CM | POA: Diagnosis not present

## 2015-03-07 DIAGNOSIS — F329 Major depressive disorder, single episode, unspecified: Secondary | ICD-10-CM | POA: Diagnosis not present

## 2015-03-07 DIAGNOSIS — I472 Ventricular tachycardia: Secondary | ICD-10-CM | POA: Diagnosis not present

## 2015-03-07 DIAGNOSIS — I10 Essential (primary) hypertension: Secondary | ICD-10-CM | POA: Diagnosis not present

## 2015-03-14 DIAGNOSIS — C169 Malignant neoplasm of stomach, unspecified: Secondary | ICD-10-CM | POA: Diagnosis not present

## 2015-03-14 DIAGNOSIS — M810 Age-related osteoporosis without current pathological fracture: Secondary | ICD-10-CM | POA: Diagnosis not present

## 2015-03-14 DIAGNOSIS — E1143 Type 2 diabetes mellitus with diabetic autonomic (poly)neuropathy: Secondary | ICD-10-CM | POA: Diagnosis not present

## 2015-03-14 DIAGNOSIS — I10 Essential (primary) hypertension: Secondary | ICD-10-CM | POA: Diagnosis not present

## 2015-03-14 DIAGNOSIS — I472 Ventricular tachycardia: Secondary | ICD-10-CM | POA: Diagnosis not present

## 2015-03-14 DIAGNOSIS — F329 Major depressive disorder, single episode, unspecified: Secondary | ICD-10-CM | POA: Diagnosis not present

## 2015-03-23 DIAGNOSIS — C169 Malignant neoplasm of stomach, unspecified: Secondary | ICD-10-CM | POA: Diagnosis not present

## 2015-03-23 DIAGNOSIS — E1143 Type 2 diabetes mellitus with diabetic autonomic (poly)neuropathy: Secondary | ICD-10-CM | POA: Diagnosis not present

## 2015-03-23 DIAGNOSIS — I472 Ventricular tachycardia: Secondary | ICD-10-CM | POA: Diagnosis not present

## 2015-03-23 DIAGNOSIS — M810 Age-related osteoporosis without current pathological fracture: Secondary | ICD-10-CM | POA: Diagnosis not present

## 2015-03-23 DIAGNOSIS — F329 Major depressive disorder, single episode, unspecified: Secondary | ICD-10-CM | POA: Diagnosis not present

## 2015-03-23 DIAGNOSIS — I10 Essential (primary) hypertension: Secondary | ICD-10-CM | POA: Diagnosis not present

## 2015-03-28 ENCOUNTER — Ambulatory Visit (INDEPENDENT_AMBULATORY_CARE_PROVIDER_SITE_OTHER): Payer: Medicare Other | Admitting: Endocrinology

## 2015-03-28 VITALS — BP 132/74 | HR 62 | Temp 98.4°F | Ht 60.0 in | Wt 132.0 lb

## 2015-03-28 DIAGNOSIS — E119 Type 2 diabetes mellitus without complications: Secondary | ICD-10-CM | POA: Diagnosis not present

## 2015-03-28 DIAGNOSIS — R634 Abnormal weight loss: Secondary | ICD-10-CM

## 2015-03-28 DIAGNOSIS — Z0000 Encounter for general adult medical examination without abnormal findings: Secondary | ICD-10-CM

## 2015-03-28 DIAGNOSIS — Z23 Encounter for immunization: Secondary | ICD-10-CM | POA: Diagnosis not present

## 2015-03-28 DIAGNOSIS — C169 Malignant neoplasm of stomach, unspecified: Secondary | ICD-10-CM

## 2015-03-28 LAB — POCT GLYCOSYLATED HEMOGLOBIN (HGB A1C): Hemoglobin A1C: 7.9

## 2015-03-28 MED ORDER — MEGESTROL ACETATE 625 MG/5ML PO SUSP: 625.0000 mg | Freq: Every day | ORAL | Status: DC

## 2015-03-28 NOTE — Progress Notes (Signed)
Subjective:    Patient ID: Tammy Hughes, female    DOB: 12/06/1932, 81 y.o.   MRN: [id number]  HPI Pt returns for f/u of diabetes mellitus: DM type: Insulin-requiring type 2 Dx'ed: 4098 Complications: polyneuropathy.   Therapy: insulin since 2005 GDM: never DKA: never Severe hypoglycemia: never Pancreatitis: never Other: she chose bid premixed insulin; She no longer takes tube-feeding. Interval history: She denies hypoglycemia.  no cbg record, but states cbg's are in general higher as the day goes on.  appetite is still poor Past Medical History  Diagnosis Date  . HYPERCHOLESTEROLEMIA 02/01/2008  . ANXIETY 03/08/2008  . PERIPHERAL NEUROPATHY 02/23/2007  . HYPERTENSION 03/08/2008  . GERD 02/23/2007  . DIVERTICULOSIS, COLON 03/08/2008  . OSTEOARTHRITIS 02/23/2007  . OSTEOPOROSIS 02/23/2007  . Urolithiasis   . Allergy     SEASONAL  . Cataract     REMOVED BILATERAL  . Depression   . Stomach cancer 07/20/14    invasive adenocarcinoma w/signet rings features  . DIABETES MELLITUS, TYPE I 02/23/2007  . DVT (deep venous thrombosis)     Past Surgical History  Procedure Laterality Date  . Cholecystectomy  1995  . Esophagogastroduodenoscopy  06/08/2002  . Gastrectomy N/A 07/20/2014    Procedure: PROXIMAL GASTRECTOMY;  Surgeon: Stark Klein, MD;  Location: [facility] [place];  Service: General;  Laterality: N/A;  . Laparoscopy N/A 07/20/2014    Procedure: LAPAROSCOPY DIAGNOSTIC;  Surgeon: Stark Klein, MD;  Location: [facility];  Service: General;  Laterality: N/A;  . Gastrojejunostomy N/A 07/20/2014    Procedure: GASTROJEJUNOSTOMY;  Surgeon: Stark Klein, MD;  Location: [facility];  Service: General;  Laterality: N/A;    Social History   Social History  . Marital Status: Married    Spouse Name: N/A  . Number of Children: N/A  . Years of Education: N/A   Occupational History  . Retired    Social History Main Topics  . Smoking status: Never Smoker   . Smokeless tobacco: Never Used  . Alcohol Use: No    . Drug Use: No  . Sexual Activity: Not on file   Other Topics Concern  . Not on file   Social History Narrative   Dentist          Current Outpatient Prescriptions on File Prior to Visit  Medication Sig Dispense Refill  . Alum & Mag Hydroxide-Simeth (MAGIC MOUTHWASH W/LIDOCAINE) SOLN Take 5 mLs by mouth 4 (four) times daily as needed for mouth pain. 120 mL 1  . clopidogrel (PLAVIX) 75 MG tablet Take 1 tablet (75 mg total) by mouth daily. 90 tablet 1  . esomeprazole (NEXIUM) 40 MG capsule Take 1 capsule (40 mg total) by mouth daily at 12 noon. 90 capsule 2  . glucose blood (BAYER CONTOUR TEST) test strip Use to check blood sugar 3 times per day. Dx Code E11.9 200 each 2  . hyaluronate sodium (RADIAPLEXRX) GEL Apply 1 application topically daily.    Marland Kitchen HYDROcodone-acetaminophen (HYCET) 7.5-325 mg/15 ml solution Take 15 mLs by mouth every 4 (four) hours as needed for moderate pain. 473 mL 0  . insulin aspart protamine - aspart (NOVOLOG MIX 70/30 FLEXPEN) (70-30) 100 UNIT/ML FlexPen 40 units each morning, and 20 units each evening, and pen needles 2/day 30 mL 11  . Insulin Pen Needle 31G X 5 MM MISC Use to inject insulin 3 times per day. 200 each 2  . LORazepam (ATIVAN) 1 MG tablet Take 1 mg by mouth at bedtime. As needed for sleep    .  metoprolol tartrate (LOPRESSOR) 25 MG tablet TAKE 1 TABLET TWICE A DAY 180 tablet 1  . mirtazapine (REMERON) 15 MG tablet Take 1 tablet (15 mg total) by mouth at bedtime. 30 tablet 3  . promethazine (PHENERGAN) 12.5 MG tablet Take 1 tablet (12.5 mg total) by mouth every 4 (four) hours as needed for nausea or vomiting. 50 tablet 2  . rosuvastatin (CRESTOR) 10 MG tablet Take 1 tablet (10 mg total) by mouth daily. 90 tablet 3  . traMADol (ULTRAM) 50 MG tablet Take 1 tablet (50 mg total) by mouth every 6 (six) hours as needed. 30 tablet 0   No current facility-administered medications on file prior to visit.    Allergies  Allergen  Reactions  . Pirfenidone Diarrhea and Nausea And Vomiting  . Aspirin     rash    Family History  Problem Relation Age of Onset  . Cancer Father     Prostate Cancer  . Cancer Sister     Breast Cancer  . Diabetes Sister   . Diabetes Sister     BP 132/74 mmHg  Pulse 62  Temp(Src) 98.4 F (36.9 C) (Oral)  Ht 5' (1.524 m)  Wt 132 lb (59.875 kg)  BMI 25.78 kg/m2  SpO2 96%  Review of Systems She has lost 4 more lbs. Appetite is still poor (she says changing megace to liquid might help).       Objective:   Physical Exam VITAL SIGNS:  See vs page.  GENERAL: no distress. Pulses: dorsalis pedis intact bilat.   MSK: no deformity of the feet. CV: no leg edema.  Skin:  no ulcer on the feet.  normal color and temp on the feet. Neuro: sensation is intact to touch on the feet.    A1c=7.9%    Assessment & Plan:  DM: this is the best control this pt should aim for, given this regimen, which does match insulin to her changing needs throughout the day Weight loss, persistent.  Ref dietician.  i changed megace to liquid.  Subjective:   Patient here for Medicare annual wellness visit and management of other chronic and acute problems.     Risk factors: advanced age    81 of Physicians Providing Medical Care to Patient:  See "snapshot"   Activities of Daily Living: In your present state of health, do you have any difficulty performing the following activities?:  Preparing food and eating?: No  Bathing yourself: No  Getting dressed: No  Using the toilet:No  Moving around from place to place: No  In the past year have you fallen or had a near fall?: No    Home Safety: Has smoke detector and wears seat belts. No firearms. No excess sun exposure.  Diet and Exercise  Current exercise habits: now poor, due to cancer Dietary issues discussed: appetite is poor  Depression Screen  Q1: Over the past two weeks, have you felt down, depressed or hopeless? no  Q2: Over the past  two weeks, have you felt little interest or pleasure in doing things? no   The following portions of the patient's history were reviewed and updated as appropriate: allergies, current medications, past family history, past medical history, past social history, past surgical history and problem list.   Review of Systems  Denies hearing loss, and visual loss Objective:   Vision:  Sees opthalmologist Hearing: grossly normal Body mass index:  See vs page Msk: pt easily and quickly performs "get-up-and-go" from a sitting position Cognitive Impairment  Assessment: cognition, memory and judgment appear normal.  remembers 2/3 at 5 minutes.  excellent recall.  can easily read and write a sentence.  alert and oriented x 3.     Assessment:   Medicare wellness utd on preventive parameters    Plan:   During the course of the visit the patient was educated and counseled about appropriate screening and preventive services including:       Fall prevention   Screening mammography Diabetes screening  Nutrition counseling   Vaccines / LAB  Patient Instructions (the written plan) was given to the patient.

## 2015-03-28 NOTE — Progress Notes (Signed)
we discussed code status.  pt requests full code, but would not want to be started or maintained on artificial life-support measures if there was not a reasonable chance of recovery 

## 2015-03-28 NOTE — Patient Instructions (Addendum)
please consider these measures for your health:  minimize alcohol.  do not use tobacco products.  have a colonoscopy at least every 10 years from age 79.  Women should have an annual mammogram from age 62.  keep firearms safely stored.  always use seat belts.  have working smoke alarms in your home.  see an eye doctor and dentist regularly.  never drive under the influence of alcohol or drugs (including prescription drugs).  those with fair skin should take precautions against the sun. it is critically important to prevent falling down (keep floor areas well-lit, dry, and free of loose objects.  If you have a cane, walker, or wheelchair, you should use it, even for short trips around the house.  Also, try not to rush).   Please come back for a follow-up appointment in 4 months.

## 2015-03-29 DIAGNOSIS — F329 Major depressive disorder, single episode, unspecified: Secondary | ICD-10-CM | POA: Diagnosis not present

## 2015-03-29 DIAGNOSIS — C169 Malignant neoplasm of stomach, unspecified: Secondary | ICD-10-CM | POA: Diagnosis not present

## 2015-03-29 DIAGNOSIS — I472 Ventricular tachycardia: Secondary | ICD-10-CM | POA: Diagnosis not present

## 2015-03-29 DIAGNOSIS — E1143 Type 2 diabetes mellitus with diabetic autonomic (poly)neuropathy: Secondary | ICD-10-CM | POA: Diagnosis not present

## 2015-03-29 DIAGNOSIS — I10 Essential (primary) hypertension: Secondary | ICD-10-CM | POA: Diagnosis not present

## 2015-03-29 DIAGNOSIS — M810 Age-related osteoporosis without current pathological fracture: Secondary | ICD-10-CM | POA: Diagnosis not present

## 2015-04-03 DIAGNOSIS — C169 Malignant neoplasm of stomach, unspecified: Secondary | ICD-10-CM | POA: Diagnosis not present

## 2015-04-03 DIAGNOSIS — I10 Essential (primary) hypertension: Secondary | ICD-10-CM | POA: Diagnosis not present

## 2015-04-03 DIAGNOSIS — E1143 Type 2 diabetes mellitus with diabetic autonomic (poly)neuropathy: Secondary | ICD-10-CM | POA: Diagnosis not present

## 2015-04-03 DIAGNOSIS — F329 Major depressive disorder, single episode, unspecified: Secondary | ICD-10-CM | POA: Diagnosis not present

## 2015-04-03 DIAGNOSIS — I472 Ventricular tachycardia: Secondary | ICD-10-CM | POA: Diagnosis not present

## 2015-04-03 DIAGNOSIS — M810 Age-related osteoporosis without current pathological fracture: Secondary | ICD-10-CM | POA: Diagnosis not present

## 2015-04-04 ENCOUNTER — Telehealth: Payer: Self-pay | Admitting: Endocrinology

## 2015-04-04 NOTE — Telephone Encounter (Signed)
Please send in an emergency supply to [facility] and the mail order for 90 day supply

## 2015-04-04 NOTE — Telephone Encounter (Signed)
Daughter calling to see if we can call in the generic for megace liguid 40 mg per mL please

## 2015-04-04 NOTE — Telephone Encounter (Signed)
I contacted the patient and advised we are currently trying to get this med approved through her insurance. Pt voiced understanding.

## 2015-04-05 ENCOUNTER — Other Ambulatory Visit: Payer: Self-pay

## 2015-04-05 MED ORDER — MEGESTROL ACETATE 625 MG/5ML PO SUSP: 625.0000 mg | Freq: Every day | ORAL | Status: DC

## 2015-04-17 ENCOUNTER — Other Ambulatory Visit: Payer: Self-pay

## 2015-04-17 MED ORDER — CLOPIDOGREL BISULFATE 75 MG PO TABS
75.0000 mg | ORAL_TABLET | Freq: Every day | ORAL | Status: DC
Start: 2015-04-17 — End: 2015-08-24

## 2015-04-18 ENCOUNTER — Encounter: Payer: Self-pay | Admitting: Endocrinology

## 2015-04-18 ENCOUNTER — Ambulatory Visit (INDEPENDENT_AMBULATORY_CARE_PROVIDER_SITE_OTHER): Payer: Medicare Other | Admitting: Endocrinology

## 2015-04-18 VITALS — BP 134/88 | HR 59 | Temp 97.9°F | Ht 60.0 in | Wt 132.0 lb

## 2015-04-18 DIAGNOSIS — Z794 Long term (current) use of insulin: Secondary | ICD-10-CM

## 2015-04-18 DIAGNOSIS — E119 Type 2 diabetes mellitus without complications: Secondary | ICD-10-CM | POA: Diagnosis not present

## 2015-04-18 DIAGNOSIS — K5900 Constipation, unspecified: Secondary | ICD-10-CM

## 2015-04-18 MED ORDER — LACTULOSE 20 GM/30ML PO SOLN: 30.0000 mL | Freq: Four times a day (QID) | ORAL | Status: DC

## 2015-04-18 NOTE — Patient Instructions (Addendum)
i have sent a prescription to your pharmacy, for a liquid for the bowels. when you have results, you should reduce the medication to once a day. Another option is a once a day pill.  Let me know if you want this.   Please continue the same insulin.  i'll see you next time.        Constipation Constipation is when a person:  Poops (has a bowel movement) less than 3 times a week.  Has a hard time pooping.  Has poop that is dry, hard, or bigger than normal. HOME CARE   Eat foods with a lot of fiber in them. This includes fruits, vegetables, beans, and whole grains such as brown rice.  Avoid fatty foods and foods with a lot of sugar. This includes french fries, hamburgers, cookies, candy, and soda.  If you are not getting enough fiber from food, take products with added fiber in them (supplements).  Drink enough fluid to keep your pee (urine) clear or pale yellow.  Exercise on a regular basis, or as told by your doctor.  Go to the restroom when you feel like you need to poop. Do not hold it.  Only take medicine as told by your doctor. Do not take medicines that help you poop (laxatives) without talking to your doctor first. GET HELP RIGHT AWAY IF:   You have bright red blood in your poop (stool).  Your constipation lasts more than 4 days or gets worse.  You have belly (abdominal) or butt (rectal) pain.  You have thin poop (as thin as a pencil).  You lose weight, and it cannot be explained. MAKE SURE YOU:   Understand these instructions.  Will watch your condition.  Will get help right away if you are not doing well or get worse. Document Released: 12/18/2007 Document Revised: 07/06/2013 Document Reviewed: 04/12/2013 [facility] Patient Information 2015 [place], [place]. This information is not intended to replace advice given to you by your health care provider. Make sure you discuss any questions you have with your health care provider.

## 2015-04-18 NOTE — Progress Notes (Signed)
Subjective:    Patient ID: Tammy Hughes, female    DOB: [birth year], 81 y.o.   MRN: [id number]  HPI Pt states 5 days of severe constipation.  She now has 1 day of slight pain, throughout the abdomen.  No assoc BRBPR.  No help with MOM, stool softener, prune juice, or fleet emema.  Past Medical History  Diagnosis Date  . HYPERCHOLESTEROLEMIA 02/01/2008  . ANXIETY 03/08/2008  . PERIPHERAL NEUROPATHY 02/23/2007  . HYPERTENSION 03/08/2008  . GERD 02/23/2007  . DIVERTICULOSIS, COLON 03/08/2008  . OSTEOARTHRITIS 02/23/2007  . OSTEOPOROSIS 02/23/2007  . Urolithiasis   . Allergy     SEASONAL  . Cataract     REMOVED BILATERAL  . Depression   . Stomach cancer ([facility]) 07/20/14    invasive adenocarcinoma w/signet rings features  . DIABETES MELLITUS, TYPE I 02/23/2007  . DVT (deep venous thrombosis) [facility])     Past Surgical History  Procedure Laterality Date  . Cholecystectomy  1995  . Esophagogastroduodenoscopy  06/08/2002  . Gastrectomy N/A 07/20/2014    Procedure: PROXIMAL GASTRECTOMY;  Surgeon: Stark Klein, MD;  Location: [facility];  Service: General;  Laterality: N/A;  . Laparoscopy N/A 07/20/2014    Procedure: LAPAROSCOPY DIAGNOSTIC;  Surgeon: Stark Klein, MD;  Location: [facility];  Service: General;  Laterality: N/A;  . Gastrojejunostomy N/A 07/20/2014    Procedure: GASTROJEJUNOSTOMY;  Surgeon: Stark Klein, MD;  Location: [place];  Service: General;  Laterality: N/A;    Social History   Social History  . Marital Status: Married    Spouse Name: N/A  . Number of Children: N/A  . Years of Education: N/A   Occupational History  . Retired    Social History Main Topics  . Smoking status: Never Smoker   . Smokeless tobacco: Never Used  . Alcohol Use: No  . Drug Use: No  . Sexual Activity: Not on file   Other Topics Concern  . Not on file   Social History Narrative   Dentist          Current Outpatient Prescriptions on File Prior to Visit  Medication Sig Dispense  Refill  . Alum & Mag Hydroxide-Simeth (MAGIC MOUTHWASH W/LIDOCAINE) SOLN Take 5 mLs by mouth 4 (four) times daily as needed for mouth pain. 120 mL 1  . clopidogrel (PLAVIX) 75 MG tablet Take 1 tablet (75 mg total) by mouth daily. 90 tablet 1  . esomeprazole (NEXIUM) 40 MG capsule Take 1 capsule (40 mg total) by mouth daily at 12 noon. 90 capsule 2  . glucose blood (BAYER CONTOUR TEST) test strip Use to check blood sugar 3 times per day. Dx Code E11.9 200 each 2  . hyaluronate sodium (RADIAPLEXRX) GEL Apply 1 application topically daily.    Marland Kitchen HYDROcodone-acetaminophen (HYCET) 7.5-325 mg/15 ml solution Take 15 mLs by mouth every 4 (four) hours as needed for moderate pain. 473 mL 0  . insulin aspart protamine - aspart (NOVOLOG MIX 70/30 FLEXPEN) (70-30) 100 UNIT/ML FlexPen 40 units each morning, and 20 units each evening, and pen needles 2/day 30 mL 11  . Insulin Pen Needle 31G X 5 MM MISC Use to inject insulin 3 times per day. 200 each 2  . LORazepam (ATIVAN) 1 MG tablet Take 1 mg by mouth at bedtime. As needed for sleep    . megestrol (MEGACE ES) 625 MG/5ML suspension Take 5 mLs (625 mg total) by mouth daily. 150 mL 3  . metoprolol tartrate (LOPRESSOR) 25 MG tablet TAKE  1 TABLET TWICE A DAY 180 tablet 1  . mirtazapine (REMERON) 15 MG tablet Take 1 tablet (15 mg total) by mouth at bedtime. 30 tablet 3  . promethazine (PHENERGAN) 12.5 MG tablet Take 1 tablet (12.5 mg total) by mouth every 4 (four) hours as needed for nausea or vomiting. 50 tablet 2  . rosuvastatin (CRESTOR) 10 MG tablet Take 1 tablet (10 mg total) by mouth daily. 90 tablet 3  . traMADol (ULTRAM) 50 MG tablet Take 1 tablet (50 mg total) by mouth every 6 (six) hours as needed. 30 tablet 0   No current facility-administered medications on file prior to visit.    Allergies  Allergen Reactions  . Pirfenidone Diarrhea and Nausea And Vomiting  . Aspirin     rash    Family History  Problem Relation Age of Onset  . Cancer Father      Prostate Cancer  . Cancer Sister     Breast Cancer  . Diabetes Sister   . Diabetes Sister     BP 134/88 mmHg  Pulse 59  Temp(Src) 97.9 F (36.6 C) (Oral)  Ht 5' (1.524 m)  Wt 132 lb (59.875 kg)  BMI 25.78 kg/m2  SpO2 97%  Review of Systems Denies n/v.  She denies hypoglycemia.     Objective:   Physical Exam Vital signs: see vs page Gen: elderly, frail, no distress ABDOMEN: abdomen is soft, nontender.  no hepatosplenomegaly.  not distended.  no hernia      Assessment & Plan:  Constipation: worse abd pain: prob exec by constipation, but improved overall over the past few months.  DM: apparently well-controlled  Patient is advised the following: Patient Instructions  i have sent a prescription to your pharmacy, for a liquid for the bowels. when you have results, you should reduce the medication to once a day. Another option is a once a day pill.  Let me know if you want this.   Please continue the same insulin.  i'll see you next time.        Constipation Constipation is when a person:  Poops (has a bowel movement) less than 3 times a week.  Has a hard time pooping.  Has poop that is dry, hard, or bigger than normal. HOME CARE   Eat foods with a lot of fiber in them. This includes fruits, vegetables, beans, and whole grains such as brown rice.  Avoid fatty foods and foods with a lot of sugar. This includes french fries, hamburgers, cookies, candy, and soda.  If you are not getting enough fiber from food, take products with added fiber in them (supplements).  Drink enough fluid to keep your pee (urine) clear or pale yellow.  Exercise on a regular basis, or as told by your doctor.  Go to the restroom when you feel like you need to poop. Do not hold it.  Only take medicine as told by your doctor. Do not take medicines that help you poop (laxatives) without talking to your doctor first. GET HELP RIGHT AWAY IF:   You have bright red blood in your poop  (stool).  Your constipation lasts more than 4 days or gets worse.  You have belly (abdominal) or butt (rectal) pain.  You have thin poop (as thin as a pencil).  You lose weight, and it cannot be explained. MAKE SURE YOU:   Understand these instructions.  Will watch your condition.  Will get help right away if you are not doing well or get  worse. Document Released: 12/18/2007 Document Revised: 07/06/2013 Document Reviewed: 04/12/2013 [facility] Patient Information 2015 [place], [place]. This information is not intended to replace advice given to you by your health care provider. Make sure you discuss any questions you have with your health care provider.

## 2015-04-19 ENCOUNTER — Other Ambulatory Visit: Payer: Self-pay

## 2015-04-20 ENCOUNTER — Ambulatory Visit: Payer: Medicare Other | Admitting: Nurse Practitioner

## 2015-04-20 ENCOUNTER — Telehealth: Payer: Self-pay | Admitting: Oncology

## 2015-04-20 ENCOUNTER — Telehealth: Payer: Self-pay | Admitting: Dietician

## 2015-04-20 ENCOUNTER — Ambulatory Visit: Payer: Medicare Other | Admitting: Dietician

## 2015-04-20 NOTE — Telephone Encounter (Signed)
Patient missed nutrition appointment this am. She is having diarrhea due to medication to treat constipation.  Chart reviewed.  She has been seen in the past by Dory Peru, RD but patient is currently not undergoing any cancer treatments. Patient stated that she is concerned that she is not eating the right foods.  She had no specific questions at this time.  She is unable to come in today for afternoon appointment as well.  Appointment has been rescheduled for 05/04/15.  Antonieta Iba, RD, LDN

## 2015-04-20 NOTE — Telephone Encounter (Signed)
PT SPOUSE CALLED TO R/S DUE TO PT SICK - MOVED F/U FROM 10/6 TO 10/17.

## 2015-04-24 ENCOUNTER — Other Ambulatory Visit: Payer: Self-pay | Admitting: Endocrinology

## 2015-05-01 ENCOUNTER — Telehealth: Payer: Self-pay | Admitting: Nurse Practitioner

## 2015-05-01 ENCOUNTER — Ambulatory Visit (HOSPITAL_BASED_OUTPATIENT_CLINIC_OR_DEPARTMENT_OTHER): Payer: Medicare Other | Admitting: Nurse Practitioner

## 2015-05-01 ENCOUNTER — Ambulatory Visit: Payer: Medicare Other | Admitting: Endocrinology

## 2015-05-01 VITALS — BP 167/67 | HR 60 | Temp 97.5°F | Resp 18 | Ht 60.0 in | Wt 131.2 lb

## 2015-05-01 DIAGNOSIS — C169 Malignant neoplasm of stomach, unspecified: Secondary | ICD-10-CM

## 2015-05-01 DIAGNOSIS — R634 Abnormal weight loss: Secondary | ICD-10-CM

## 2015-05-01 DIAGNOSIS — Z86718 Personal history of other venous thrombosis and embolism: Secondary | ICD-10-CM

## 2015-05-01 NOTE — Telephone Encounter (Signed)
per pof to sch pt appt-gave pt copy of avs °

## 2015-05-01 NOTE — Progress Notes (Signed)
  [place] OFFICE PROGRESS NOTE   Diagnosis:  Gastric cancer  INTERVAL HISTORY:   Tammy Hughes returns as scheduled. She reports the feeding tube was removed about a week following her last visit here in July. Since then she has noted weight loss. She notes a decrease in appetite as well. She was prescribed Megace. She eats 3 meals a day. She is scheduled to see a nutritionist later this week. No significant dysphagia. She denies pain.  Objective:  Vital signs in last 24 hours:  Blood pressure 167/67, pulse 60, temperature 97.5 F (36.4 C), temperature source Oral, resp. rate 18, height 5' (1.524 m), weight 131 lb 3.2 oz (59.512 kg), SpO2 99 %.    HEENT: No thrush or ulcers. Lymphatics: No palpable cervical, supraclavicular, axillary or inguinal lymph nodes. Resp: Fine inspiratory rales at both lung bases. Cardio: Regular rate and rhythm. GI: Abdomen soft and nontender. No mass. No organomegaly.  Vascular: No leg edema.   Lab Results:  Lab Results  Component Value Date   WBC 5.7 11/07/2014   HGB 11.0* 11/07/2014   HCT 34.3* 11/07/2014   MCV 89.6 11/07/2014   PLT 242 11/07/2014   NEUTROABS 4.3 11/07/2014    Imaging:  No results found.  Medications: I have reviewed the patient's current medications.  Assessment/Plan: 1. Gastric cancer, status post an endoscopic biopsy of a lesser curvature mass on 05/31/2014 confirming adenocarcinoma  Staging CTs of the chest and abdomen on 06/08/2014 revealed no evidence of metastatic disease  Proximal gastrectomy and feeding jejunostomy 07/20/2014, pT2,pN1  Adjuvant weekly 5-Fu/leucovorin 09/14/2014, 09/21/2014, 09/28/2014 and 10/05/2014  Initiation of radiation and infusional 5-FU 10/17/2014  5-fluorouracil pump discontinued on 10/27/2014 due to toxicity.  Radiation discontinued 11/02/2014  2. Pulmonary fibrosis 3. Esophageal stricture, status post a dilatation procedure  05/31/2014 4. Diabetes 5. Postoperative ventricle tachycardia, NSTEMI January 2016 6. Hospital-acquired pneumonia January 2016 7. CT abdomen/pelvis 09/02/2014 with postsurgical changes. Focal fluid collection in the midline abutting the distal greater curvature of the stomach measuring 2.2 x 2.9 x 3.4 cm. Mild stranding in the adjacent fat. 8. PICC placement 10/17/2014 9. Rash. Most likely related to the 5-fluorouracil. Resolved. 10. Right upper extremity DVT 10/25/2014. On Xarelto. Three-month course planned. 11. Hospitalization 10/27/2014 through 10/28/2014 nausea/vomiting and left arm discomfort. Noted to have skin toxicity from the 5-fluorouracil. The 5-fluorouracil was discontinued.   Disposition: Tammy Hughes appears stable. She has lost about 15 pounds over the past 3 months. Her weight has remained stable over the past month. The weight loss may be related to removal of the feeding tube which she was utilizing up until the time it was removed about 3 months ago.  Due to the weight loss we will see her back at a sooner interval, 4-6 weeks, to reevaluate. She will contact the office in the interim with any problems.  Plan reviewed with Dr. Benay Spice.    Tammy Hughes, Tammy Hughes ANP/GNP-BC   05/01/2015  2:21 PM

## 2015-05-04 ENCOUNTER — Encounter: Payer: Medicare Other | Attending: Endocrinology | Admitting: Dietician

## 2015-05-04 ENCOUNTER — Encounter: Payer: Self-pay | Admitting: Dietician

## 2015-05-04 VITALS — Ht 61.0 in | Wt 130.0 lb

## 2015-05-04 DIAGNOSIS — Z794 Long term (current) use of insulin: Secondary | ICD-10-CM | POA: Diagnosis not present

## 2015-05-04 DIAGNOSIS — IMO0001 Reserved for inherently not codable concepts without codable children: Secondary | ICD-10-CM

## 2015-05-04 DIAGNOSIS — E119 Type 2 diabetes mellitus without complications: Secondary | ICD-10-CM | POA: Insufficient documentation

## 2015-05-04 DIAGNOSIS — R634 Abnormal weight loss: Secondary | ICD-10-CM | POA: Insufficient documentation

## 2015-05-04 DIAGNOSIS — Z713 Dietary counseling and surveillance: Secondary | ICD-10-CM | POA: Insufficient documentation

## 2015-05-04 NOTE — Patient Instructions (Signed)
Eat a meal or snack every 3 hours. Do not skip meals. Supplement with 2 Glucerna Shakes or Boost Glucose Control daily. Add calories with added butter, oil, sour cream,  Cook cream of wheat with milk rather than water. Dip chips in humus or guacamole or dice avocado on a bowl of chili with cheese and sour cream. Whole milk rather than skim.

## 2015-05-04 NOTE — Progress Notes (Signed)
  Medical Nutrition Therapy:  Appt start time: 3474 end time:  2595.   Assessment:  Primary concerns today: Patient is here today with her husband.  Hx includes type 2 diabetes for around 20 years, gastric cancer (not receiving treatments) followed at the [facility]  She had a jejunostomy feeding tube from 07/20/2014 until 01/2015.  She received 3-5 cans in this each night and tolerated it very well at that time.  Her weight prior to cancer diagnosis about 1 year ago was 160 lbs.  In July when the feeding tube was removed her weight was 145 lbs and today's weight  Is 130 lbs.  She has no appetite although reports that this has improved Remeron and Megace which were added about 1 1/2 months ago.  Her intake is poor and weight loss and poor intake are the reasons for the visit today.  Her HgbA1C was 7.9% 03/28/15 which was increased from 01/25/15.    Patient lives with her husband.  He does the cooking and shows obvious frustration that she does not want to eat. They were here >1 hour early for their appointment due to the intention of eating lunch first and then Felton had no appetite and did not want to eat.    Nutrition focussed physical exam performed.  Patient has mild loss of muscle mass.  Fat stores are within normal limits.  Based on diet recall and husbands report oral intake is inadequate.  She did drink a Glucerna in the office during our visit.  She tried the Glucerna 1.2 at home and did not like this.  She has tried no other supplements.  Preferred Learning Style:   No preference indicated   Learning Readiness:   Ready  MEDICATIONS: see list to include 70/30 insulin (0-40 units in the am depending on her blood sugar and 20 units with dinner).   DIETARY INTAKE:  24-hr recall:  B (9:30-10AM): Fried egg and grits with butter (50%)  Snk ( AM): chips  L ( 12:30-2PM): Shrimp fried rice Snk ( PM): chips or crackers or apple with peanut butter D ( PM): 2-3 spoons chicken and  greens or stew Snk ( PM): none Beverages: water  Usual physical activity: none  Estimated energy needs: 1800-2000 calories 80 g protein  Progress Towards Goal(s):  In progress.   Nutritional Diagnosis:  [place]-3.2 Unintentional weight loss As related to inadequate oral intake.  As evidenced by diet recall and patient report as well as 15 lb weight loss (unintentional in the last 3 months).    Intervention:  Nutrition counseling/education on increasing protein and calories in her diet.  Provided samples of Glucerna and coupons.  Eat a meal or snack every 3 hours. Do not skip meals. Supplement with 2 Glucerna Shakes or Boost Glucose Control daily. Add calories with added butter, oil, sour cream,  Cook cream of wheat with milk rather than water. Dip chips in humus or guacamole or dice avocado on a bowl of chili with cheese and sour cream. Whole milk rather than skim.   Teaching Method Utilized:  Visual Auditory  Handouts given during visit include:  Increasing calories and protein in your diet.  Barriers to learning/adherence to lifestyle change: poor appetite  Demonstrated degree of understanding via:  Teach Back   Monitoring/Evaluation:  Dietary intake, exercise, and body weight prn.

## 2015-05-10 ENCOUNTER — Other Ambulatory Visit: Payer: Self-pay | Admitting: Endocrinology

## 2015-05-30 ENCOUNTER — Other Ambulatory Visit: Payer: Self-pay

## 2015-05-30 ENCOUNTER — Other Ambulatory Visit: Payer: Self-pay | Admitting: *Deleted

## 2015-05-30 ENCOUNTER — Telehealth: Payer: Self-pay | Admitting: Oncology

## 2015-05-30 ENCOUNTER — Ambulatory Visit (HOSPITAL_BASED_OUTPATIENT_CLINIC_OR_DEPARTMENT_OTHER): Payer: Medicare Other | Admitting: Oncology

## 2015-05-30 VITALS — BP 136/64 | HR 70 | Temp 97.6°F | Resp 18 | Ht 61.0 in | Wt 129.4 lb

## 2015-05-30 DIAGNOSIS — F329 Major depressive disorder, single episode, unspecified: Secondary | ICD-10-CM

## 2015-05-30 DIAGNOSIS — Z86718 Personal history of other venous thrombosis and embolism: Secondary | ICD-10-CM

## 2015-05-30 DIAGNOSIS — C169 Malignant neoplasm of stomach, unspecified: Secondary | ICD-10-CM

## 2015-05-30 DIAGNOSIS — R634 Abnormal weight loss: Secondary | ICD-10-CM

## 2015-05-30 MED ORDER — MIRTAZAPINE 15 MG PO TABS: 30.0000 mg | ORAL_TABLET | Freq: Every day | ORAL | Status: DC

## 2015-05-30 MED ORDER — ROSUVASTATIN CALCIUM 10 MG PO TABS: 10.0000 mg | ORAL_TABLET | Freq: Every day | ORAL | Status: DC

## 2015-05-30 NOTE — Progress Notes (Signed)
  [place] OFFICE PROGRESS NOTE   Diagnosis: Gastric cancer  INTERVAL HISTORY:   Tammy Hughes returns with multiple family members. She denies nausea and pain. No difficulty with eating. She stays at home most of the time. She only goes out to church. She is physically capable of performing activities at home and getting out, but the family report she has no interest. She dresses herself and can go to the bathroom on her own, but her family does everything else.  Objective:  Vital signs in last 24 hours:  Blood pressure 136/64, pulse 70, temperature 97.6 F (36.4 C), temperature source Oral, resp. rate 18, height 5\' 1"  (1.549 m), weight 129 lb 6.4 oz (58.695 kg), SpO2 98 %.    HEENT: Neck without mass Lymphatics: No cervical, supraclavicular, or axillary nodes Resp: Inspiratory rales throughout the posterior chest, no respiratory distress Cardio: Regular rate and rhythm GI: No hepatomegaly, no mass, nontender, no apparent ascites Vascular: No leg edema, the right lower leg near the ankle is slightly larger than the left side   Medications: I have reviewed the patient's current medications.  Assessment/Plan: 1. Gastric cancer, status post an endoscopic biopsy of a lesser curvature mass on 05/31/2014 confirming adenocarcinoma  Staging CTs of the chest and abdomen on 06/08/2014 revealed no evidence of metastatic disease  Proximal gastrectomy and feeding jejunostomy 07/20/2014, pT2,pN1  Adjuvant weekly 5-Fu/leucovorin 09/14/2014, 09/21/2014, 09/28/2014 and 10/05/2014  Initiation of radiation and infusional 5-FU 10/17/2014  5-fluorouracil pump discontinued on 10/27/2014 due to toxicity.  Radiation discontinued 11/02/2014  2. Pulmonary fibrosis 3. Esophageal stricture, status post a dilatation procedure 05/31/2014 4. Diabetes 5. Postoperative ventricle tachycardia, NSTEMI January 2016 6. Hospital-acquired pneumonia January 2016 7. CT abdomen/pelvis 09/02/2014  with postsurgical changes. Focal fluid collection in the midline abutting the distal greater curvature of the stomach measuring 2.2 x 2.9 x 3.4 cm. Mild stranding in the adjacent fat. 8. PICC placement 10/17/2014 9. Rash. Most likely related to the 5-fluorouracil. Resolved. 10. Right upper extremity DVT 10/25/2014. On Xarelto. Three-month course planned. 11. Hospitalization 10/27/2014 through 10/28/2014 nausea/vomiting and left arm discomfort. Noted to have skin toxicity from the 5-fluorouracil. The 5-fluorouracil was discontinued. 12. Depression   Disposition:  Tammy Hughes remains in clinical remission from gastric cancer. Her weight is down a few pounds over the past few months. This may be related to depression. We decided to increase the Remeron to 30 mg each night. She agrees to meet with the [place] worker/psychology department.  Tammy Hughes will return for an office visit in one month.  [name], MD  05/30/2015  10:54 AM

## 2015-05-30 NOTE — Telephone Encounter (Signed)
per pof to sch pt appt-cld & spoke to pt and gave pt time & date of appt °

## 2015-05-31 ENCOUNTER — Telehealth: Payer: Self-pay | Admitting: *Deleted

## 2015-05-31 NOTE — Telephone Encounter (Signed)
Received message from Vaughan Sine, Counseling Intern. She will be contacting pt.

## 2015-06-01 ENCOUNTER — Encounter: Payer: Self-pay | Admitting: *Deleted

## 2015-06-02 ENCOUNTER — Telehealth: Payer: Self-pay

## 2015-06-02 NOTE — Telephone Encounter (Signed)
Counseling Intern Note:  Attempted to call Pt using phone number listed in chart, but was not able to connect. Phone number may be invalid or out of service at this time.  FU: Counseling intern will attempt to contact Pt again on 11/28 (will be out of the office until then)  Vaughan Sine  Counseling Intern [phone]

## 2015-06-02 NOTE — Progress Notes (Signed)
Oncology Nurse Navigator Documentation  Oncology Nurse Navigator Flowsheets 06/01/2015  Navigator Encounter Type Routine visit  Patient Visit Type Medonc  Treatment Phase Completed-in remission  Barriers/Navigation Needs Family concerns-depression  Interventions Referrals  Referrals Social Work  Support Groups/Services GI  Time Spent with Patient 30  Spent some time alone with Tammy Hughes and she reports she feels like her family does love and care for her, but she wants some independence. Feels they are smothering her--fixes meals (she likes) but is told what to eat on what day. 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 She agrees to speak with CSW today, who may suggest counseling in future.

## 2015-06-18 DIAGNOSIS — R0602 Shortness of breath: Secondary | ICD-10-CM | POA: Diagnosis not present

## 2015-06-26 ENCOUNTER — Telehealth: Payer: Self-pay | Admitting: Oncology

## 2015-06-26 NOTE — Telephone Encounter (Signed)
DUE to LT out per BS moved from 12/14 to jan. Spoke with patient re new appointment for 1/11.

## 2015-06-28 ENCOUNTER — Ambulatory Visit: Payer: Medicare Other | Admitting: Nurse Practitioner

## 2015-07-26 ENCOUNTER — Ambulatory Visit (HOSPITAL_BASED_OUTPATIENT_CLINIC_OR_DEPARTMENT_OTHER): Payer: Medicare Other | Admitting: Nurse Practitioner

## 2015-07-26 ENCOUNTER — Telehealth: Payer: Self-pay | Admitting: Oncology

## 2015-07-26 VITALS — BP 139/68 | HR 61 | Temp 97.6°F | Resp 18 | Ht 61.0 in | Wt 128.3 lb

## 2015-07-26 DIAGNOSIS — F329 Major depressive disorder, single episode, unspecified: Secondary | ICD-10-CM | POA: Diagnosis not present

## 2015-07-26 DIAGNOSIS — C165 Malignant neoplasm of lesser curvature of stomach, unspecified: Secondary | ICD-10-CM | POA: Diagnosis not present

## 2015-07-26 DIAGNOSIS — Z86718 Personal history of other venous thrombosis and embolism: Secondary | ICD-10-CM | POA: Diagnosis not present

## 2015-07-26 DIAGNOSIS — C161 Malignant neoplasm of fundus of stomach: Secondary | ICD-10-CM

## 2015-07-26 NOTE — Progress Notes (Signed)
  [place] OFFICE PROGRESS NOTE   Diagnosis:  Gastric cancer  INTERVAL HISTORY:   [name] returns as scheduled. She overall feels better than she did the time of her last visit about 2 months ago. She feels she is more active. She denies nausea. No dysphagia. A few days ago she noted pain at the right shoulder and right upper arm. She thinks that she "slept funny". The pain is improving. No weakness or numbness in the arm.  Objective:  Vital signs in last 24 hours:  Blood pressure 139/68, pulse 61, temperature 97.6 F (36.4 C), temperature source Oral, resp. rate 18, height 5\' 1"  (1.549 m), weight 128 lb 4.8 oz (58.196 kg), SpO2 97 %.    HEENT: No thrush or ulcers. Lymphatics: No palpable cervical, supraclavicular, axillary or inguinal lymph nodes. Resp: Inspiratory rales at the lower lung fields bilaterally. No respiratory distress. Cardio: Regular rate and rhythm. GI: Abdomen soft and nontender. No hepatomegaly. Vascular: No leg edema. Neuro: Arm strength is intact bilaterally.    Lab Results:  Lab Results  Component Value Date   WBC 5.7 11/07/2014   HGB 11.0* 11/07/2014   HCT 34.3* 11/07/2014   MCV 89.6 11/07/2014   PLT 242 11/07/2014   NEUTROABS 4.3 11/07/2014    Imaging:  No results found.  Medications: I have reviewed the patient's current medications.  Assessment/Plan: 1. Gastric cancer, status post an endoscopic biopsy of a lesser curvature mass on 05/31/2014 confirming adenocarcinoma  Staging CTs of the chest and abdomen on 06/08/2014 revealed no evidence of metastatic disease  Proximal gastrectomy and feeding jejunostomy 07/20/2014, pT2,pN1  Adjuvant weekly 5-Fu/leucovorin 09/14/2014, 09/21/2014, 09/28/2014 and 10/05/2014  Initiation of radiation and infusional 5-FU 10/17/2014  5-fluorouracil pump discontinued on 10/27/2014 due to toxicity.  Radiation discontinued 11/02/2014  2. Pulmonary fibrosis 3. Esophageal stricture,  status post a dilatation procedure 05/31/2014 4. Diabetes 5. Postoperative ventricle tachycardia, NSTEMI January 2016 6. Hospital-acquired pneumonia January 2016 7. CT abdomen/pelvis 09/02/2014 with postsurgical changes. Focal fluid collection in the midline abutting the distal greater curvature of the stomach measuring 2.2 x 2.9 x 3.4 cm. Mild stranding in the adjacent fat. 8. PICC placement 10/17/2014 9. Rash. Most likely related to the 5-fluorouracil. Resolved. 10. Right upper extremity DVT 10/25/2014. She completed a course of Xarelto. 11. Hospitalization 10/27/2014 through 10/28/2014 nausea/vomiting and left arm discomfort. Noted to have skin toxicity from the 5-fluorouracil. The 5-fluorouracil was discontinued. 12. Depression. She continues Remeron.   Disposition: Tammy Hughes appears stable. She remains in clinical remission from gastric cancer. She will return for a follow-up visit in 3 months. She will contact the office in the interim with any problems.  Plan reviewed with Dr. Benay Spice.    [place], [place] ANP/GNP-BC   07/26/2015  10:35 AM

## 2015-07-26 NOTE — Telephone Encounter (Signed)
Gv pt appt for 10/26/15.

## 2015-07-28 ENCOUNTER — Ambulatory Visit: Payer: Medicare Other | Admitting: Endocrinology

## 2015-08-02 ENCOUNTER — Ambulatory Visit (INDEPENDENT_AMBULATORY_CARE_PROVIDER_SITE_OTHER): Payer: Medicare Other | Admitting: Endocrinology

## 2015-08-02 ENCOUNTER — Encounter: Payer: Self-pay | Admitting: Endocrinology

## 2015-08-02 VITALS — BP 132/64 | HR 58 | Temp 97.9°F | Ht 61.0 in | Wt 128.0 lb

## 2015-08-02 DIAGNOSIS — C161 Malignant neoplasm of fundus of stomach: Secondary | ICD-10-CM

## 2015-08-02 DIAGNOSIS — R413 Other amnesia: Secondary | ICD-10-CM | POA: Diagnosis not present

## 2015-08-02 DIAGNOSIS — Z794 Long term (current) use of insulin: Secondary | ICD-10-CM | POA: Diagnosis not present

## 2015-08-02 DIAGNOSIS — IMO0001 Reserved for inherently not codable concepts without codable children: Secondary | ICD-10-CM

## 2015-08-02 DIAGNOSIS — E119 Type 2 diabetes mellitus without complications: Secondary | ICD-10-CM | POA: Diagnosis not present

## 2015-08-02 LAB — CBC WITH DIFFERENTIAL/PLATELET
BASOS ABS: 0 10*3/uL (ref 0.0–0.1)
BASOS PCT: 0.3 % (ref 0.0–3.0)
EOS ABS: 0.1 10*3/uL (ref 0.0–0.7)
Eosinophils Relative: 2.3 % (ref 0.0–5.0)
HCT: 37.5 % (ref 36.0–46.0)
Hemoglobin: 12.1 g/dL (ref 12.0–15.0)
Lymphocytes Relative: 29.3 % (ref 12.0–46.0)
Lymphs Abs: 1.1 10*3/uL (ref 0.7–4.0)
MCHC: 32.2 g/dL (ref 30.0–36.0)
MCV: 91.1 fl (ref 78.0–100.0)
MONO ABS: 0.3 10*3/uL (ref 0.1–1.0)
Monocytes Relative: 8.6 % (ref 3.0–12.0)
NEUTROS ABS: 2.3 10*3/uL (ref 1.4–7.7)
Neutrophils Relative %: 59.5 % (ref 43.0–77.0)
PLATELETS: 291 10*3/uL (ref 150.0–400.0)
RBC: 4.12 Mil/uL (ref 3.87–5.11)
RDW: 13.9 % (ref 11.5–15.5)
WBC: 3.9 10*3/uL — AB (ref 4.0–10.5)

## 2015-08-02 LAB — MICROALBUMIN / CREATININE URINE RATIO
CREATININE, U: 96.8 mg/dL
Microalb Creat Ratio: 11.9 mg/g (ref 0.0–30.0)
Microalb, Ur: 11.5 mg/dL — ABNORMAL HIGH (ref 0.0–1.9)

## 2015-08-02 LAB — BASIC METABOLIC PANEL
BUN: 9 mg/dL (ref 6–23)
CHLORIDE: 106 meq/L (ref 96–112)
CO2: 31 meq/L (ref 19–32)
CREATININE: 0.74 mg/dL (ref 0.40–1.20)
Calcium: 10.2 mg/dL (ref 8.4–10.5)
GFR: 97.04 mL/min (ref >60.00)
Glucose, Bld: 122 mg/dL — ABNORMAL HIGH (ref 70–99)
POTASSIUM: 3.6 meq/L (ref 3.5–5.1)
Sodium: 143 mEq/L (ref 135–145)

## 2015-08-02 LAB — HEPATIC FUNCTION PANEL
ALT: 19 U/L (ref 0–35)
AST: 25 U/L (ref 0–37)
Albumin: 3.8 g/dL (ref 3.5–5.2)
Alkaline Phosphatase: 66 U/L (ref 39–117)
BILIRUBIN TOTAL: 0.3 mg/dL (ref 0.2–1.2)
Bilirubin, Direct: 0.1 mg/dL (ref 0.0–0.3)
TOTAL PROTEIN: 7.6 g/dL (ref 6.0–8.3)

## 2015-08-02 LAB — TSH: TSH: 0.65 u[IU]/mL (ref 0.35–4.50)

## 2015-08-02 LAB — POCT GLYCOSYLATED HEMOGLOBIN (HGB A1C): HEMOGLOBIN A1C: 7.5

## 2015-08-02 LAB — VITAMIN B12: VITAMIN B 12: 497 pg/mL (ref 211–911)

## 2015-08-02 MED ORDER — DONEPEZIL HCL 5 MG PO TABS: 5.0000 mg | ORAL_TABLET | Freq: Every day | ORAL | Status: DC

## 2015-08-02 NOTE — Patient Instructions (Addendum)
Please continue the same insulin. blood tests are requested for you today.  We'll let you know about the results. i have sent a prescription to your pharmacy, for the memory. Please call in 1 week or so, if the memory loss persists.  If so, we can increase this medication, or add another.  check your blood sugar twice.  vary the time of day when you check, between before the 3 meals, and at bedtime.  also check if you have symptoms of your blood sugar being too high or too low.  please keep a record of the readings and bring it to your next appointment here (or you can bring the meter itself).  You can write it on any piece of paper.  please call us sooner if your blood sugar goes below 70, or if you have a lot of readings over 200.

## 2015-08-02 NOTE — Progress Notes (Signed)
Subjective:    Patient ID: Tammy Hughes, female    DOB: [birth year], 82 y.o.   MRN: [id number]  HPI Pt returns for f/u of diabetes mellitus: DM type: Insulin-requiring type 2.  [id number].   Complications: polyneuropathy.   Therapy: insulin since 2005.  GDM: never.  DKA: never. Severe hypoglycemia: never.   Pancreatitis: never.  Other: she chose bid premixed insulin; She no longer takes tube-feeding.  Interval history: She denies hypoglycemia.  no cbg record, but states cbg's very from 85-193.  It is in general higher as the day goes on. appetite is slightly better.  She denies hypoglycemia.  Past Medical History  Diagnosis Date  . HYPERCHOLESTEROLEMIA 02/01/2008  . ANXIETY 03/08/2008  . PERIPHERAL NEUROPATHY 02/23/2007  . HYPERTENSION 03/08/2008  . GERD 02/23/2007  . DIVERTICULOSIS, COLON 03/08/2008  . OSTEOARTHRITIS 02/23/2007  . OSTEOPOROSIS 02/23/2007  . Urolithiasis   . Allergy     SEASONAL  . Cataract     REMOVED BILATERAL  . Depression   . Stomach cancer ([place]) 07/20/14    invasive adenocarcinoma w/signet rings features  . DIABETES MELLITUS, TYPE I 02/23/2007  . DVT (deep venous thrombosis) [facility])     Past Surgical History  Procedure Laterality Date  . Cholecystectomy  1995  . Esophagogastroduodenoscopy  06/08/2002  . Gastrectomy N/A 07/20/2014    Procedure: PROXIMAL GASTRECTOMY;  Surgeon: Stark Klein, MD;  Location: [place];  Service: General;  Laterality: N/A;  . Laparoscopy N/A 07/20/2014    Procedure: LAPAROSCOPY DIAGNOSTIC;  Surgeon: Stark Klein, MD;  Location: [employer];  Service: General;  Laterality: N/A;  . Gastrojejunostomy N/A 07/20/2014    Procedure: GASTROJEJUNOSTOMY;  Surgeon: Stark Klein, MD;  Location: [place];  Service: General;  Laterality: N/A;    Social History   Social History  . Marital Status: Married    Spouse Name: N/A  . Number of Children: N/A  . Years of Education: N/A   Occupational History  . Retired    Social History Main Topics  .  Smoking status: Never Smoker   . Smokeless tobacco: Never Used  . Alcohol Use: No  . Drug Use: No  . Sexual Activity: Not on file   Other Topics Concern  . Not on file   Social History Narrative   Dentist          Current Outpatient Prescriptions on File Prior to Visit  Medication Sig Dispense Refill  . Alum & Mag Hydroxide-Simeth (MAGIC MOUTHWASH W/LIDOCAINE) SOLN Take 5 mLs by mouth 4 (four) times daily as needed for mouth pain. 120 mL 1  . clopidogrel (PLAVIX) 75 MG tablet Take 1 tablet (75 mg total) by mouth daily. 90 tablet 1  . esomeprazole (NEXIUM) 40 MG capsule Take 1 capsule (40 mg total) by mouth daily at 12 noon. 90 capsule 2  . glucose blood (BAYER CONTOUR TEST) test strip Use to check blood sugar 3 times per day. Dx Code E11.9 200 each 2  . hyaluronate sodium (RADIAPLEXRX) GEL Apply 1 application topically daily.    Marland Kitchen HYDROcodone-acetaminophen (HYCET) 7.5-325 mg/15 ml solution Take 15 mLs by mouth every 4 (four) hours as needed for moderate pain. 473 mL 0  . insulin aspart protamine - aspart (NOVOLOG MIX 70/30 FLEXPEN) (70-30) 100 UNIT/ML FlexPen 40 units each morning, and 20 units each evening, and pen needles 2/day 30 mL 11  . Insulin Pen Needle 31G X 5 MM MISC Use to inject insulin 3 times per day. 200 each  2  . Lactulose 20 GM/30ML SOLN Take 30 mLs (20 g total) by mouth 4 (four) times daily. 1892 mL 11  . LORazepam (ATIVAN) 1 MG tablet Take 1 mg by mouth at bedtime. As needed for sleep    . megestrol (MEGACE ES) 625 MG/5ML suspension TAKE 1 TEASPOONFUL BY MOUTH DAILY AS DIRECTED 150 mL 2  . metoprolol tartrate (LOPRESSOR) 25 MG tablet TAKE 1 TABLET TWICE A DAY 180 tablet 1  . mirtazapine (REMERON) 15 MG tablet Take 2 tablets (30 mg total) by mouth at bedtime. 60 tablet 2  . promethazine (PHENERGAN) 12.5 MG tablet Take 1 tablet (12.5 mg total) by mouth every 4 (four) hours as needed for nausea or vomiting. 50 tablet 2  . rosuvastatin (CRESTOR) 10 MG  tablet Take 1 tablet (10 mg total) by mouth daily. 90 tablet 3  . traMADol (ULTRAM) 50 MG tablet Take 1 tablet (50 mg total) by mouth every 6 (six) hours as needed. 30 tablet 0  . vitamin B-12 (CYANOCOBALAMIN) 100 MCG tablet Take 100 mcg by mouth daily. Sublingual, unknown dose     No current facility-administered medications on file prior to visit.    Allergies  Allergen Reactions  . Pirfenidone Diarrhea and Nausea And Vomiting  . Aspirin     rash    Family History  Problem Relation Age of Onset  . Cancer Father     Prostate Cancer  . Cancer Sister     Breast Cancer  . Diabetes Sister   . Diabetes Sister     BP 132/64 mmHg  Pulse 58  Temp(Src) 97.9 F (36.6 C) (Oral)  Ht 5\' 1"  (1.549 m)  Wt 128 lb (58.06 kg)  BMI 24.20 kg/m2  SpO2 96%  Review of Systems She has lost a few more lbs.  She has slight memory loss.  I asked husband who says pt does not have probs with ADL's or behavior.      Objective:   Physical Exam VITAL SIGNS:  See vs page GENERAL: no distress Pulses: dorsalis pedis intact bilat.   MSK: no deformity of the feet CV: no leg edema Skin:  no ulcer on the feet.  normal color and temp on the feet. Neuro: sensation is intact to touch on the feet PSYCH: Alert and well-oriented.  Does not appear anxious nor depressed.   Remembers 3/3 at 5 minutes.     A1c=7.5%  Lab Results  Component Value Date   WBC 3.9* 08/02/2015   HGB 12.1 08/02/2015   HCT 37.5 08/02/2015   MCV 91.1 08/02/2015   PLT 291.0 08/02/2015       Assessment & Plan:  DM: this is the best control this pt should aim for, given this regimen, which does match insulin to her changing needs throughout the day.  Memory loss, new.  Leukopenia, mild.  We'll follow.   Patient is advised the following: Patient Instructions  Please continue the same insulin. blood tests are requested for you today.  We'll let you know about the results. i have sent a prescription to your pharmacy, for the  memory. Please call in 1 week or so, if the memory loss persists.  If so, we can increase this medication, or add another.  check your blood sugar twice.  vary the time of day when you check, between before the 3 meals, and at bedtime.  also check if you have symptoms of your blood sugar being too high or too low.  please keep a  record of the readings and bring it to your next appointment here (or you can bring the meter itself).  You can write it on any piece of paper.  please call us sooner if your blood sugar goes below 70, or if you have a lot of readings over 200.

## 2015-08-04 IMAGING — US IR FLUORO GUIDE CV LINE*R*
1 series · 1 of 1 positions shown · non-contrast
Comparison: none

CLINICAL DATA: Gastric carcinoma

[Series 1: ir fluoro guide cv line*right* · 1 of 1 slices shown]
[im 1/1]
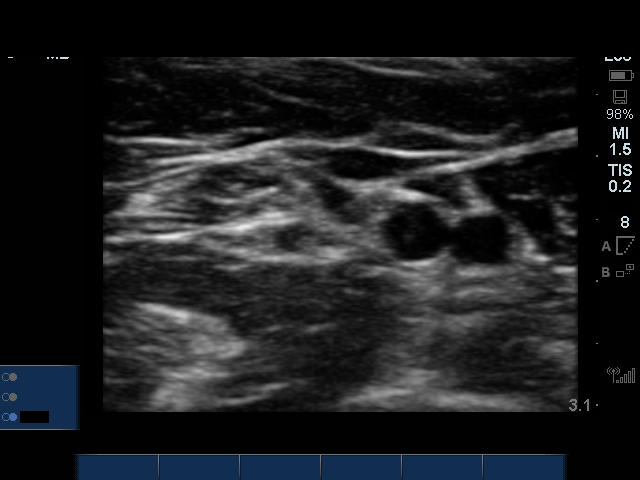

[1 of 1 positions shown; findings below may reference images not displayed]

EXAM:
RIGHT UPPER EXTREMITY PICC LINE PLACEMENT WITH ULTRASOUND AND
FLUOROSCOPIC GUIDANCE

FLUOROSCOPY TIME:  6 seconds.

PROCEDURE:
The patient was advised of the possible risks andcomplications and
agreed to undergo the procedure. The patient was then brought to the
angiographic suite for the procedure.

The right arm was prepped with chlorhexidine, drapedin the usual
sterile fashion using maximum barrier technique (cap and mask,
sterile gown, sterile gloves, large sterile sheet, hand hygiene and
cutaneous antisepsis) and infiltrated locally with 1% Lidocaine.

Ultrasound demonstrated patency of the right basilic vein, and this
was documented with an image. Under real-time ultrasound guidance,
this vein was accessed with a 21 gauge micropuncture needle and
image documentation was performed. A [DATE] wire was introduced in to
the vein. Over this, a 5 French single lumen power PICC was advanced
to the lower SVC/right atrial junction. Fluoroscopy during the
procedure and fluoro spot radiograph confirms appropriate catheter
position. The catheter was flushed and covered with asterile
dressing.

Catheter length: 35 cm.

Complications: None
IMPRESSION: Successful right arm Power PICC line placement with ultrasound and
fluoroscopic guidance. The catheter is ready for use.

## 2015-08-14 IMAGING — CT CT ANGIO CHEST
1 of 2 series · 19 of 31 positions shown · IV contrast (100 ML OMNI 300)
Comparison: 06/08/2014

CLINICAL DATA: Left arm pain and swelling. Known DVT in the right
arm.

EXAM:
CT ANGIOGRAPHY CHEST WITH CONTRAST
TECHNIQUE: Multidetector CT imaging of the chest was performed using the
standard protocol during bolus administration of intravenous
contrast. Multiplanar CT image reconstructions and MIPs were
obtained to evaluate the vascular anatomy.
CONTRAST:  100mL OMNIPAQUE IOHEXOL 350 MG/ML SOLN

[Series 6: thins for pacs · axial · 0.55mm/px · z∈[+111,+286]mm · 19 of 195 slices shown]
[im 10/195  lung]
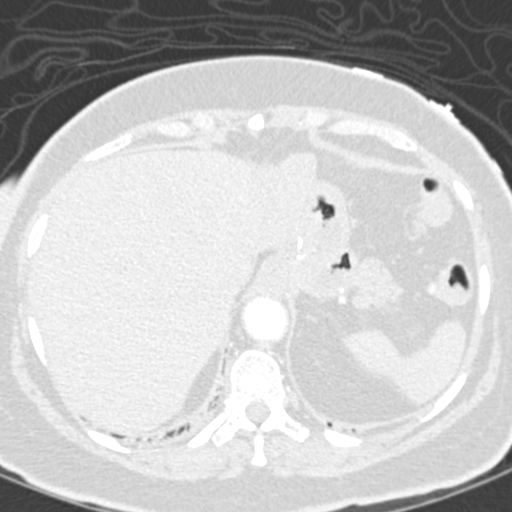
[im 20/195  mediastinal]
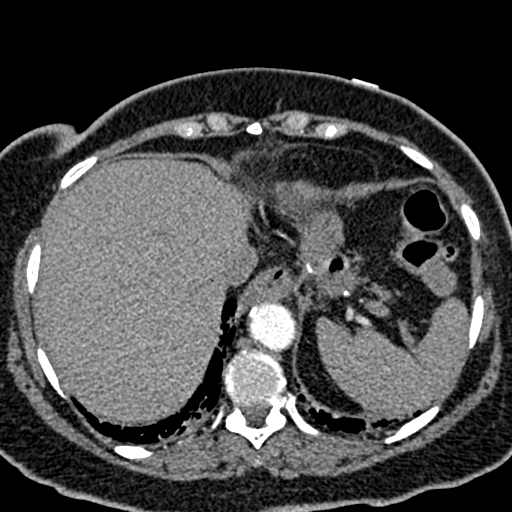
[im 30/195  lung]
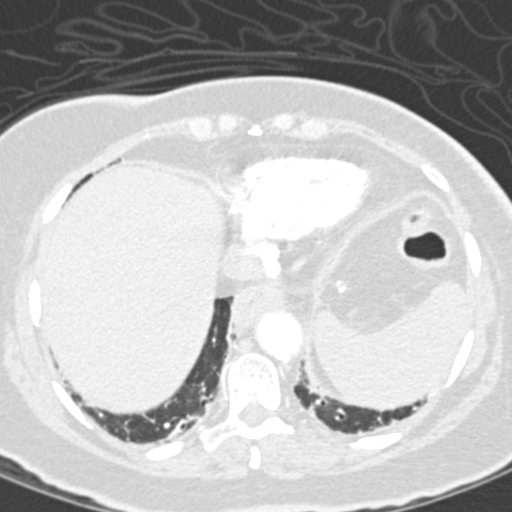
[im 39/195  mediastinal]
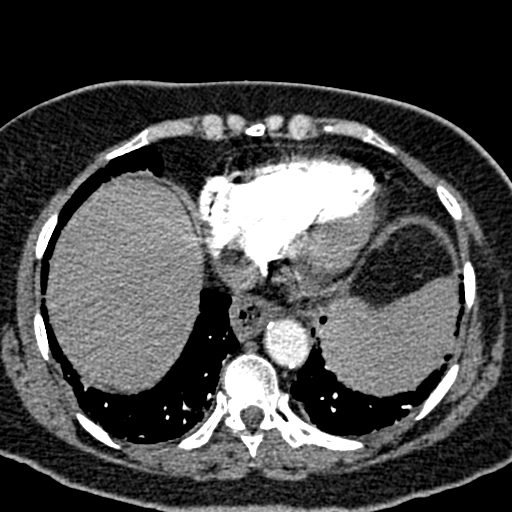
[im 49/195  lung]
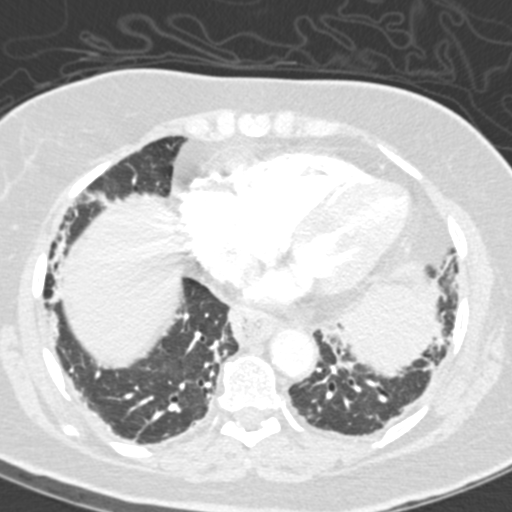
[im 65/195  mediastinal]
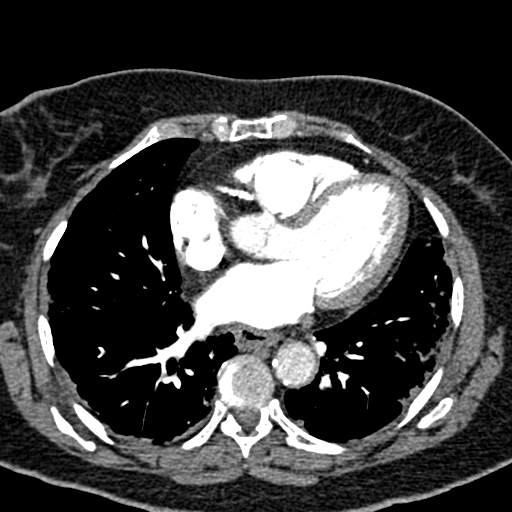
[im 68/195  lung]
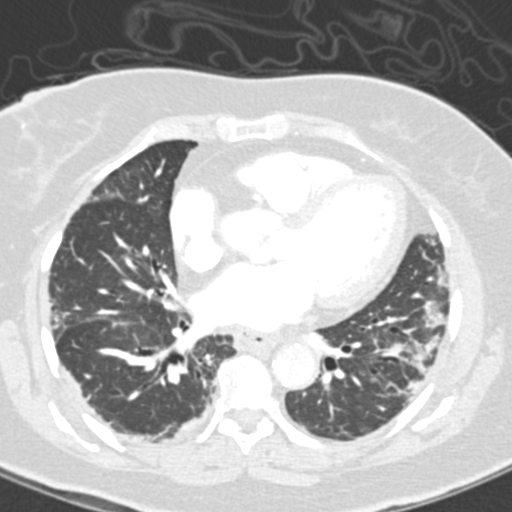
[im 78/195  mediastinal]
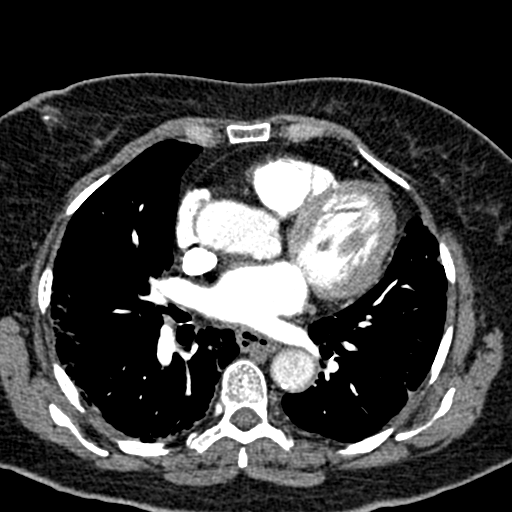
[im 88/195  lung]
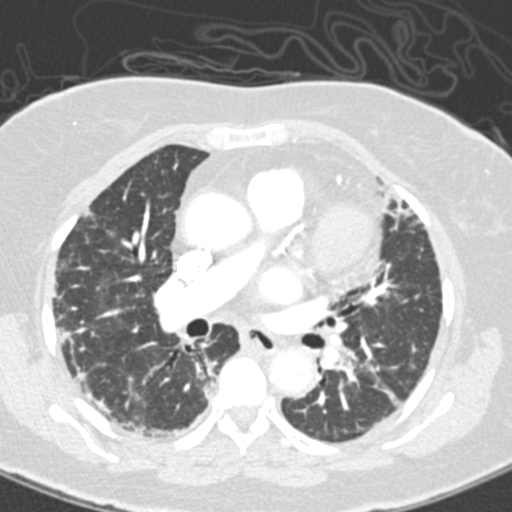
[im 98/195  mediastinal]
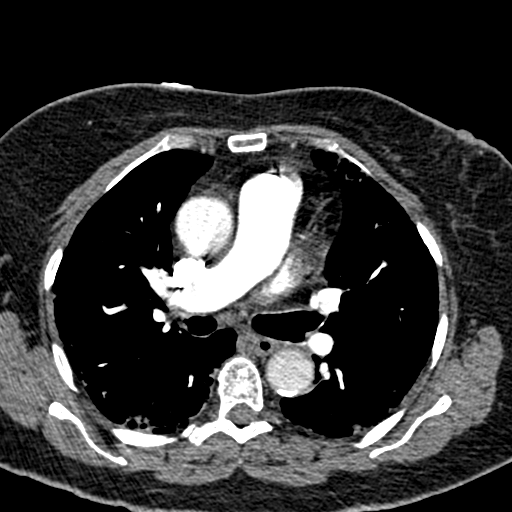
[im 107/195  lung]
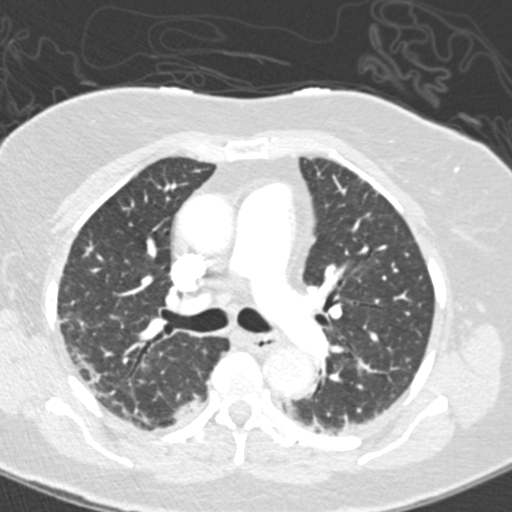
[im 117/195  mediastinal]
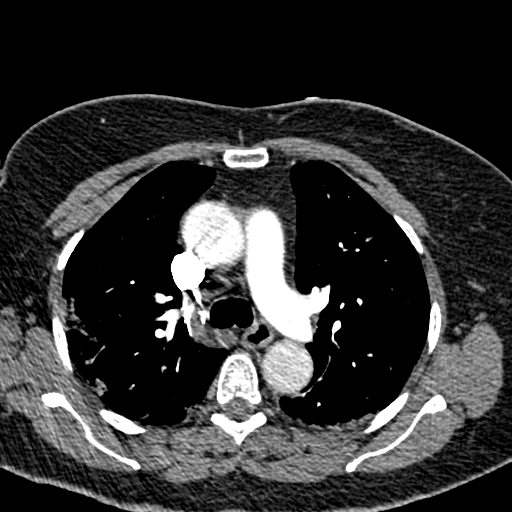
[im 127/195  lung]
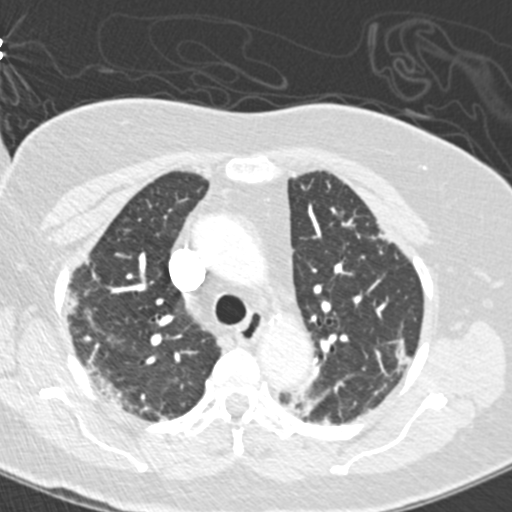
[im 130/195  mediastinal]
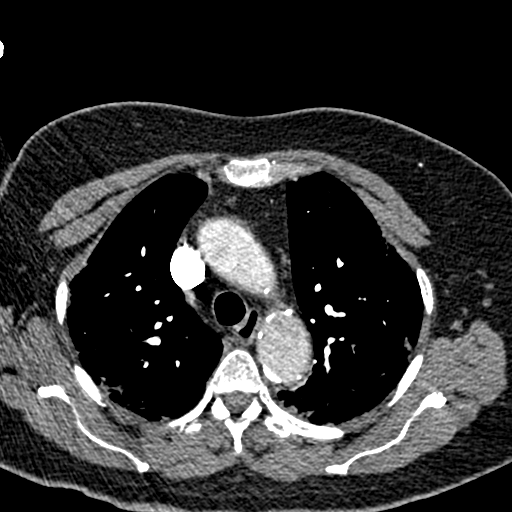
[im 146/195  lung]
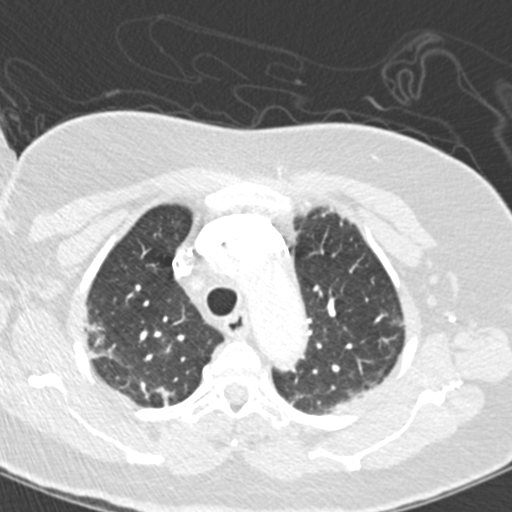
[im 156/195  mediastinal]
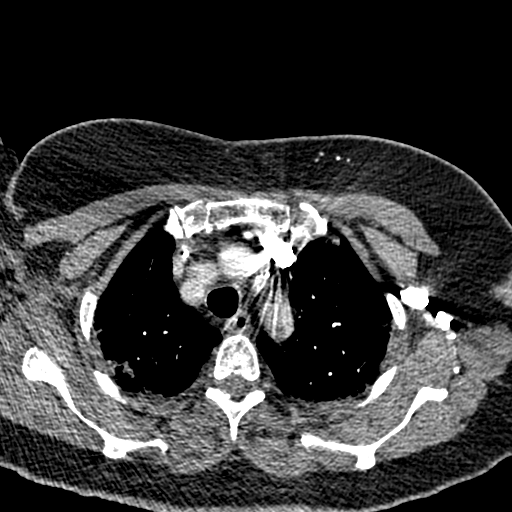
[im 165/195  lung]
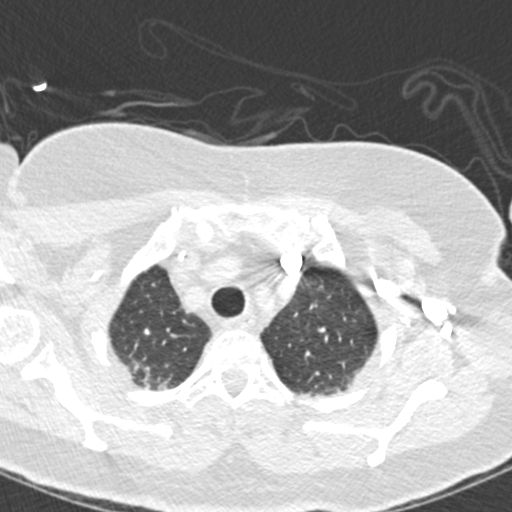
[im 175/195  mediastinal]
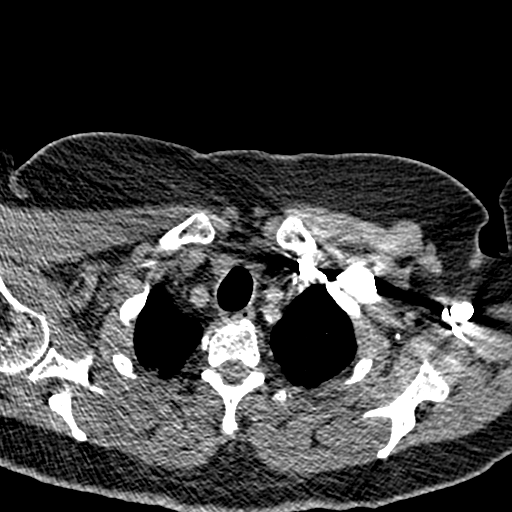
[im 185/195  lung]
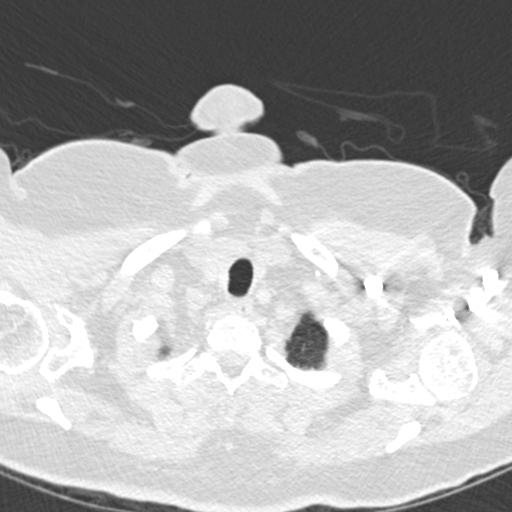

[19 of 31 positions shown; findings below may reference images not displayed]

FINDINGS: THORACIC INLET/BODY WALL:

Right upper extremity PICC is in good position. The left arm was
injected, with no gross obstructive thrombus.

MEDIASTINUM:

Chronic cardiomegaly. No pericardial effusion. Diffuse
atherosclerosis, including the coronary arteries. Faint
opacification of the upper aorta, with no evidence of dissection. No
pulmonary embolism identified.

LUNG WINDOWS:

There is chronic subpleural reticulation consistent with
interstitial lung disease. Mild fibrotic features are seen, with
occasional bronchial wall beading and interstitial distortion. No
evidence for superimposed pneumonia or edema. No effusion or
pneumothorax.

UPPER ABDOMEN:

Partially imaged gastric surgery changes.

OSSEOUS:

No acute fracture.  No suspicious lytic or blastic lesions.

Review of the MIP images confirms the above findings.
IMPRESSION: 1. Negative for pulmonary embolism.
2. Interstitial lung disease, favor fibrotic NSIP, stable from 5806.

## 2015-08-22 ENCOUNTER — Other Ambulatory Visit: Payer: Self-pay | Admitting: Endocrinology

## 2015-08-23 ENCOUNTER — Other Ambulatory Visit: Payer: Self-pay | Admitting: Endocrinology

## 2015-08-24 ENCOUNTER — Telehealth: Payer: Self-pay | Admitting: Endocrinology

## 2015-08-24 MED ORDER — ROSUVASTATIN CALCIUM 10 MG PO TABS: 10.0000 mg | ORAL_TABLET | Freq: Every day | ORAL | Status: DC

## 2015-08-24 MED ORDER — DEXLANSOPRAZOLE 30 MG PO CPDR: 30.0000 mg | DELAYED_RELEASE_CAPSULE | Freq: Every day | ORAL | Status: DC

## 2015-08-24 MED ORDER — CLOPIDOGREL BISULFATE 75 MG PO TABS: 75.0000 mg | ORAL_TABLET | Freq: Every day | ORAL | Status: DC

## 2015-08-24 NOTE — Telephone Encounter (Signed)
See note below and please advise if you would like to proceed with the pa?

## 2015-08-24 NOTE — Telephone Encounter (Signed)
Pt needs nexium PA thru [place].  She has been out 14 days, once the PA is done can we call it into her local pharmacy [facility] for a 30 day supply  Also Crestor and Plavix needs new refills to [facility]

## 2015-08-24 NOTE — Telephone Encounter (Signed)
Pt daughter called and said the Dexilant was too expensive and pt doesn't want that and she would like to speak with someone about what to do.

## 2015-08-24 NOTE — Telephone Encounter (Signed)
i need to know what ins prefers (i looked up, and it said dexilant) If it is just omeprazole, i'll do PA

## 2015-08-24 NOTE — Telephone Encounter (Signed)
dexilant is preferred over nexium (also avoids drug interaction).  i have sent a prescription to [facility] Others are refilled

## 2015-08-24 NOTE — Telephone Encounter (Signed)
Attempted to reach the pt. Pt's phone number was busy will try again at a later time.

## 2015-08-24 NOTE — Telephone Encounter (Signed)
See note below and please advise, Thanks! 

## 2015-08-24 NOTE — Telephone Encounter (Signed)
I contacted the pt's daughter. PA placed on your desk for omeprazole.

## 2015-08-25 ENCOUNTER — Telehealth: Payer: Self-pay | Admitting: Endocrinology

## 2015-08-25 MED ORDER — DEXLANSOPRAZOLE 30 MG PO CPDR: 30.0000 mg | DELAYED_RELEASE_CAPSULE | Freq: Every day | ORAL | Status: DC

## 2015-08-25 NOTE — Telephone Encounter (Signed)
Rx resubmitted

## 2015-08-25 NOTE — Telephone Encounter (Signed)
please call patient: Because of an interaction, let's try lansoprazole. i have sent a prescription to your pharmacy. Please let me know if i need to do PA for this.

## 2015-08-25 NOTE — Telephone Encounter (Signed)
Left a voicemail advising the pt's daughter of note below. Requested a call back if the daughter would like to discuss.

## 2015-08-25 NOTE — Telephone Encounter (Signed)
[facility] needs the rx for the generic prevacid called in before they can find out whether it is covered or not

## 2015-09-13 ENCOUNTER — Other Ambulatory Visit: Payer: Self-pay | Admitting: Endocrinology

## 2015-09-13 NOTE — Telephone Encounter (Signed)
Please advise if ok to refill. Medication is listed under another provider. Thanks!

## 2015-09-26 ENCOUNTER — Other Ambulatory Visit: Payer: Self-pay | Admitting: Endocrinology

## 2015-09-28 ENCOUNTER — Telehealth: Payer: Self-pay | Admitting: Endocrinology

## 2015-09-28 NOTE — Telephone Encounter (Signed)
Patient's daughter called stating that her Rx for test strips needs a Dx code   Pharmacy: [facility]   Please advise    Thank you

## 2015-09-28 NOTE — Telephone Encounter (Signed)
rx re-faxed with dx code

## 2015-09-29 ENCOUNTER — Telehealth: Payer: Self-pay | Admitting: Endocrinology

## 2015-09-29 NOTE — Telephone Encounter (Signed)
Pt daughter called concerning her Rx for Nexium and said that the mail order pharmacy said they sent over a request for it, but haven't heard anything back.

## 2015-09-29 NOTE — Telephone Encounter (Signed)
I left a vm for the pt's daughter advising the nexium is not approved under the pt's insurance formulary and we haven't received a fax regarding the medication from the pharmacy,

## 2015-10-03 DIAGNOSIS — Z7689 Persons encountering health services in other specified circumstances: Secondary | ICD-10-CM

## 2015-10-25 ENCOUNTER — Telehealth: Payer: Self-pay | Admitting: Oncology

## 2015-10-25 NOTE — Telephone Encounter (Signed)
pt left voicemail wanting to r/s-cld pt & r/s appt to 4/18@3 

## 2015-10-26 ENCOUNTER — Ambulatory Visit: Payer: Medicare Other | Admitting: Oncology

## 2015-10-31 ENCOUNTER — Telehealth: Payer: Self-pay | Admitting: Oncology

## 2015-10-31 ENCOUNTER — Ambulatory Visit (HOSPITAL_BASED_OUTPATIENT_CLINIC_OR_DEPARTMENT_OTHER): Payer: Medicare Other | Admitting: Oncology

## 2015-10-31 VITALS — BP 172/56 | HR 61 | Temp 97.5°F | Resp 18 | Wt 129.7 lb

## 2015-10-31 DIAGNOSIS — F329 Major depressive disorder, single episode, unspecified: Secondary | ICD-10-CM

## 2015-10-31 DIAGNOSIS — Z86718 Personal history of other venous thrombosis and embolism: Secondary | ICD-10-CM

## 2015-10-31 DIAGNOSIS — C161 Malignant neoplasm of fundus of stomach: Secondary | ICD-10-CM

## 2015-10-31 DIAGNOSIS — C165 Malignant neoplasm of lesser curvature of stomach, unspecified: Secondary | ICD-10-CM

## 2015-10-31 NOTE — Telephone Encounter (Signed)
Gave and printed appt sched and avs fo rpt for OCT °

## 2015-10-31 NOTE — Progress Notes (Signed)
  [facility] OFFICE PROGRESS NOTE   Diagnosis: Gastric cancer  INTERVAL HISTORY:   Tammy Hughes returns as scheduled. She feels well. She is now active getting out of the house and driving. She has a good appetite. She has early satiety. She has occasional right-sided abdominal pain when she gets full.  Objective:  Vital signs in last 24 hours:  Blood pressure 172/56, pulse 61, temperature 97.5 F (36.4 C), temperature source Oral, resp. rate 18, weight 129 lb 11.2 oz (58.832 kg), SpO2 98 %.    HEENT: Neck without mass Lymphatics: No cervical, supraclavicular, axillary, or inguinal nodes Resp: Lungs with diffuse rales, no respiratory distress Cardio: Regular rate and rhythm GI: No hepatomegaly, nontender, no mass Vascular: No leg edema-the right lower leg is slightly larger than the left side   Medications: I have reviewed the patient's current medications.  Assessment/Plan: 1. Gastric cancer, status post an endoscopic biopsy of a lesser curvature mass on 05/31/2014 confirming adenocarcinoma  Staging CTs of the chest and abdomen on 06/08/2014 revealed no evidence of metastatic disease  Proximal gastrectomy and feeding jejunostomy 07/20/2014, pT2,pN1  Adjuvant weekly 5-Fu/leucovorin 09/14/2014, 09/21/2014, 09/28/2014 and 10/05/2014  Initiation of radiation and infusional 5-FU 10/17/2014  5-fluorouracil pump discontinued on 10/27/2014 due to toxicity.  Radiation discontinued 11/02/2014  2. Pulmonary fibrosis 3. Esophageal stricture, status post a dilatation procedure 05/31/2014 4. Diabetes 5. Postoperative ventricle tachycardia, NSTEMI January 2016 6. Hospital-acquired pneumonia January 2016 7. CT abdomen/pelvis 09/02/2014 with postsurgical changes. Focal fluid collection in the midline abutting the distal greater curvature of the stomach measuring 2.2 x 2.9 x 3.4 cm. Mild stranding in the adjacent fat. 8. PICC placement 10/17/2014 9. Rash. Most likely  related to the 5-fluorouracil. Resolved. 10. Right upper extremity DVT 10/25/2014. She completed a course of Xarelto. 11. Hospitalization 10/27/2014 through 10/28/2014 nausea/vomiting and left arm discomfort. Noted to have skin toxicity from the 5-fluorouracil. The 5-fluorouracil was discontinued. 12. Depression. She continues Remeron.    Disposition:  Tammy Hughes is in remission from gastric cancer. Her performance status has improved. She will return for an office visit in 6 months.  [name], MD  10/31/2015  3:07 PM

## 2015-11-13 ENCOUNTER — Encounter: Payer: Self-pay | Admitting: Endocrinology

## 2015-11-13 ENCOUNTER — Other Ambulatory Visit (INDEPENDENT_AMBULATORY_CARE_PROVIDER_SITE_OTHER): Payer: Medicare Other | Admitting: *Deleted

## 2015-11-13 ENCOUNTER — Ambulatory Visit (INDEPENDENT_AMBULATORY_CARE_PROVIDER_SITE_OTHER): Payer: Medicare Other | Admitting: Endocrinology

## 2015-11-13 VITALS — BP 120/70 | HR 53 | Temp 97.5°F | Resp 14 | Ht 61.0 in | Wt 127.4 lb

## 2015-11-13 DIAGNOSIS — Z794 Long term (current) use of insulin: Secondary | ICD-10-CM

## 2015-11-13 DIAGNOSIS — E119 Type 2 diabetes mellitus without complications: Secondary | ICD-10-CM | POA: Diagnosis not present

## 2015-11-13 DIAGNOSIS — IMO0001 Reserved for inherently not codable concepts without codable children: Secondary | ICD-10-CM

## 2015-11-13 LAB — POCT GLYCOSYLATED HEMOGLOBIN (HGB A1C): Hemoglobin A1C: 8.1

## 2015-11-13 MED ORDER — INSULIN ASPART PROT & ASPART (70-30 MIX) 100 UNIT/ML PEN: PEN_INJECTOR | SUBCUTANEOUS | Status: DC

## 2015-11-13 NOTE — Patient Instructions (Addendum)
Please reduce the insulin to 35 units with breakfast, and 15 units with supper Please come back for a follow-up appointment in 4 months.  Please continue the same dexilant.  check your blood sugar twice.  vary the time of day when you check, between before the 3 meals, and at bedtime.  also check if you have symptoms of your blood sugar being too high or too low.  please keep a record of the readings and bring it to your next appointment here (or you can bring the meter itself).  You can write it on any piece of paper.  please call us sooner if your blood sugar goes below 70, or if you have a lot of readings over 200.

## 2015-11-13 NOTE — Progress Notes (Signed)
Subjective:    Patient ID: Tammy Hughes, female    DOB: 1933/04/07, 82 y.o.   MRN: [id number]  HPI The state of at least three ongoing medical problems is addressed today, with interval history of each noted here: Pt returns for f/u of diabetes mellitus: DM type: Insulin-requiring type 2.  Dx'edCG:[id number].   Complications: polyneuropathy.   Therapy: insulin since 2005.  GDM: never.  DKA: never. Severe hypoglycemia: never.   Pancreatitis: never.  Other: she chose bid premixed insulin.   Interval history: She says she often has to take less than the full amount, due to mild hypoglycemia. Appetite is good.   Memory loss: she did not tolerate aricept (insomnia). GERD: she denies heartburn.  Past Medical History  Diagnosis Date  . HYPERCHOLESTEROLEMIA 02/01/2008  . ANXIETY 03/08/2008  . PERIPHERAL NEUROPATHY 02/23/2007  . HYPERTENSION 03/08/2008  . GERD 02/23/2007  . DIVERTICULOSIS, COLON 03/08/2008  . OSTEOARTHRITIS 02/23/2007  . OSTEOPOROSIS 02/23/2007  . Urolithiasis   . Allergy     SEASONAL  . Cataract     REMOVED BILATERAL  . Depression   . Stomach cancer ([facility]) 07/20/14    invasive adenocarcinoma w/signet rings features  . DIABETES MELLITUS, TYPE I 02/23/2007  . DVT (deep venous thrombosis) [facility])     Past Surgical History  Procedure Laterality Date  . Cholecystectomy  1995  . Esophagogastroduodenoscopy  06/08/2002  . Gastrectomy N/A 07/20/2014    Procedure: PROXIMAL GASTRECTOMY;  Surgeon: Stark Klein, MD;  Location: [facility];  Service: General;  Laterality: N/A;  . Laparoscopy N/A 07/20/2014    Procedure: LAPAROSCOPY DIAGNOSTIC;  Surgeon: Stark Klein, MD;  Location: [facility];  Service: General;  Laterality: N/A;  . Gastrojejunostomy N/A 07/20/2014    Procedure: GASTROJEJUNOSTOMY;  Surgeon: Stark Klein, MD;  Location: [facility];  Service: General;  Laterality: N/A;    Social History   Social History  . Marital Status: Married    Spouse Name: N/A  . Number of Children: N/A  . Years  of Education: N/A   Occupational History  . Retired    Social History Main Topics  . Smoking status: Never Smoker   . Smokeless tobacco: Never Used  . Alcohol Use: No  . Drug Use: No  . Sexual Activity: Not on file   Other Topics Concern  . Not on file   Social History Narrative   Dentist          Current Outpatient Prescriptions on File Prior to Visit  Medication Sig Dispense Refill  . BAYER CONTOUR TEST test strip USE ONE STRIP TO CHECK GLUCOSE THREE TIMES DAILY 200 each 1  . clopidogrel (PLAVIX) 75 MG tablet Take 1 tablet (75 mg total) by mouth daily. 90 tablet 1  . Dexlansoprazole 30 MG capsule Take 1 capsule (30 mg total) by mouth daily. 30 capsule 11  . HYDROcodone-acetaminophen (HYCET) 7.5-325 mg/15 ml solution Take 15 mLs by mouth every 4 (four) hours as needed for moderate pain. 473 mL 0  . Insulin Pen Needle 31G X 5 MM MISC Use to inject insulin 3 times per day. 200 each 2  . Lactulose 20 GM/30ML SOLN Take 30 mLs (20 g total) by mouth 4 (four) times daily. (Patient not taking: Reported on 10/31/2015) 1892 mL 11  . megestrol (MEGACE ES) 625 MG/5ML suspension TAKE 1 TEASPOONFUL BY MOUTH DAILY AS DIRECTED 150 mL 2  . metoprolol tartrate (LOPRESSOR) 25 MG tablet TAKE 1 TABLET TWICE A DAY 180 tablet 1  .  mirtazapine (REMERON) 15 MG tablet TAKE 1 TABLET BY MOUTH EACH NIGHT AT BEDTIME IF NEEDED 30 tablet 3  . rosuvastatin (CRESTOR) 10 MG tablet Take 1 tablet (10 mg total) by mouth daily. 90 tablet 3  . traMADol (ULTRAM) 50 MG tablet Take 1 tablet (50 mg total) by mouth every 6 (six) hours as needed. 30 tablet 0   No current facility-administered medications on file prior to visit.    Allergies  Allergen Reactions  . Pirfenidone Diarrhea and Nausea And Vomiting  . Aspirin     rash    Family History  Problem Relation Age of Onset  . Cancer Father     Prostate Cancer  . Cancer Sister     Breast Cancer  . Diabetes Sister   . Diabetes Sister      BP 120/70 mmHg  Pulse 53  Temp(Src) 97.5 F (36.4 C) (Oral)  Resp 14  Ht 5\' 1"  (1.549 m)  Wt 127 lb 6.4 oz (57.788 kg)  BMI 24.08 kg/m2  SpO2 97%  Review of Systems No weight change.  No LOC    Objective:   Physical Exam VITAL SIGNS:  See vs page GENERAL: no distress Pulses: dorsalis pedis intact bilat.  MSK: no deformity of the feet CV: no leg edema Skin: no ulcer on the feet. normal color and temp on the feet. Neuro: sensation is intact to touch on the feet   A1c=8.1%    Assessment & Plan:  DM: she needs an insulin dosage she can take without fear of hypoglycemia.  GERD: well-controlled Memory loss: therapy is limited by multiple drug intolerance.  D/C aricept.   Patient is advised the following: Patient Instructions  Please reduce the insulin to 35 units with breakfast, and 15 units with supper Please come back for a follow-up appointment in 4 months.  Please continue the same dexilant.  check your blood sugar twice.  vary the time of day when you check, between before the 3 meals, and at bedtime.  also check if you have symptoms of your blood sugar being too high or too low.  please keep a record of the readings and bring it to your next appointment here (or you can bring the meter itself).  You can write it on any piece of paper.  please call us sooner if your blood sugar goes below 70, or if you have a lot of readings over 200.

## 2016-01-06 ENCOUNTER — Other Ambulatory Visit: Payer: Self-pay | Admitting: Endocrinology

## 2016-01-31 ENCOUNTER — Observation Stay (HOSPITAL_COMMUNITY)
Admission: AD | Admit: 2016-01-31 | Discharge: 2016-02-03 | Disposition: A | Discharge status: Home / Self Care | Payer: Medicare Other | Source: Other Acute Inpatient Hospital | Attending: Internal Medicine | Admitting: Internal Medicine

## 2016-01-31 DIAGNOSIS — D649 Anemia, unspecified: Secondary | ICD-10-CM | POA: Diagnosis present

## 2016-01-31 DIAGNOSIS — Z794 Long term (current) use of insulin: Secondary | ICD-10-CM | POA: Diagnosis not present

## 2016-01-31 DIAGNOSIS — R079 Chest pain, unspecified: Secondary | ICD-10-CM | POA: Diagnosis present

## 2016-01-31 DIAGNOSIS — J189 Pneumonia, unspecified organism: Secondary | ICD-10-CM

## 2016-01-31 DIAGNOSIS — F419 Anxiety disorder, unspecified: Secondary | ICD-10-CM | POA: Diagnosis not present

## 2016-01-31 DIAGNOSIS — F329 Major depressive disorder, single episode, unspecified: Secondary | ICD-10-CM | POA: Diagnosis not present

## 2016-01-31 DIAGNOSIS — C169 Malignant neoplasm of stomach, unspecified: Secondary | ICD-10-CM | POA: Diagnosis present

## 2016-01-31 DIAGNOSIS — Z8500 Personal history of malignant neoplasm of unspecified digestive organ: Secondary | ICD-10-CM | POA: Diagnosis not present

## 2016-01-31 DIAGNOSIS — R109 Unspecified abdominal pain: Secondary | ICD-10-CM

## 2016-01-31 DIAGNOSIS — I2510 Atherosclerotic heart disease of native coronary artery without angina pectoris: Secondary | ICD-10-CM | POA: Diagnosis not present

## 2016-01-31 DIAGNOSIS — R778 Other specified abnormalities of plasma proteins: Secondary | ICD-10-CM | POA: Diagnosis not present

## 2016-01-31 DIAGNOSIS — E7800 Pure hypercholesterolemia, unspecified: Secondary | ICD-10-CM

## 2016-01-31 DIAGNOSIS — R0789 Other chest pain: Principal | ICD-10-CM | POA: Insufficient documentation

## 2016-01-31 DIAGNOSIS — R112 Nausea with vomiting, unspecified: Secondary | ICD-10-CM | POA: Diagnosis not present

## 2016-01-31 DIAGNOSIS — R059 Cough, unspecified: Secondary | ICD-10-CM

## 2016-01-31 DIAGNOSIS — M25561 Pain in right knee: Secondary | ICD-10-CM

## 2016-01-31 DIAGNOSIS — I517 Cardiomegaly: Secondary | ICD-10-CM | POA: Diagnosis not present

## 2016-01-31 DIAGNOSIS — R7989 Other specified abnormal findings of blood chemistry: Secondary | ICD-10-CM | POA: Diagnosis not present

## 2016-01-31 DIAGNOSIS — J8410 Pulmonary fibrosis, unspecified: Secondary | ICD-10-CM | POA: Diagnosis not present

## 2016-01-31 DIAGNOSIS — Z7902 Long term (current) use of antithrombotics/antiplatelets: Secondary | ICD-10-CM | POA: Insufficient documentation

## 2016-01-31 DIAGNOSIS — J310 Chronic rhinitis: Secondary | ICD-10-CM

## 2016-01-31 DIAGNOSIS — K219 Gastro-esophageal reflux disease without esophagitis: Secondary | ICD-10-CM | POA: Diagnosis not present

## 2016-01-31 DIAGNOSIS — F411 Generalized anxiety disorder: Secondary | ICD-10-CM

## 2016-01-31 DIAGNOSIS — K449 Diaphragmatic hernia without obstruction or gangrene: Secondary | ICD-10-CM | POA: Diagnosis not present

## 2016-01-31 DIAGNOSIS — I472 Ventricular tachycardia: Secondary | ICD-10-CM

## 2016-01-31 DIAGNOSIS — R05 Cough: Secondary | ICD-10-CM

## 2016-01-31 DIAGNOSIS — IMO0001 Reserved for inherently not codable concepts without codable children: Secondary | ICD-10-CM

## 2016-01-31 DIAGNOSIS — R51 Headache: Secondary | ICD-10-CM | POA: Diagnosis not present

## 2016-01-31 DIAGNOSIS — R918 Other nonspecific abnormal finding of lung field: Secondary | ICD-10-CM | POA: Diagnosis not present

## 2016-01-31 DIAGNOSIS — G5602 Carpal tunnel syndrome, left upper limb: Secondary | ICD-10-CM

## 2016-01-31 DIAGNOSIS — G319 Degenerative disease of nervous system, unspecified: Secondary | ICD-10-CM | POA: Diagnosis not present

## 2016-01-31 DIAGNOSIS — Z79899 Other long term (current) drug therapy: Secondary | ICD-10-CM | POA: Diagnosis not present

## 2016-01-31 DIAGNOSIS — I4729 Other ventricular tachycardia: Secondary | ICD-10-CM

## 2016-01-31 DIAGNOSIS — S90821D Blister (nonthermal), right foot, subsequent encounter: Secondary | ICD-10-CM

## 2016-01-31 DIAGNOSIS — Z86718 Personal history of other venous thrombosis and embolism: Secondary | ICD-10-CM | POA: Diagnosis not present

## 2016-01-31 DIAGNOSIS — R531 Weakness: Secondary | ICD-10-CM

## 2016-01-31 DIAGNOSIS — Z85028 Personal history of other malignant neoplasm of stomach: Secondary | ICD-10-CM | POA: Diagnosis not present

## 2016-01-31 DIAGNOSIS — G893 Neoplasm related pain (acute) (chronic): Secondary | ICD-10-CM

## 2016-01-31 DIAGNOSIS — E1142 Type 2 diabetes mellitus with diabetic polyneuropathy: Secondary | ICD-10-CM | POA: Diagnosis present

## 2016-01-31 DIAGNOSIS — R9439 Abnormal result of other cardiovascular function study: Secondary | ICD-10-CM | POA: Diagnosis not present

## 2016-01-31 DIAGNOSIS — M79602 Pain in left arm: Secondary | ICD-10-CM

## 2016-01-31 DIAGNOSIS — E785 Hyperlipidemia, unspecified: Secondary | ICD-10-CM | POA: Diagnosis not present

## 2016-01-31 DIAGNOSIS — M25562 Pain in left knee: Secondary | ICD-10-CM

## 2016-01-31 DIAGNOSIS — M25569 Pain in unspecified knee: Secondary | ICD-10-CM

## 2016-01-31 DIAGNOSIS — R413 Other amnesia: Secondary | ICD-10-CM

## 2016-01-31 DIAGNOSIS — E1159 Type 2 diabetes mellitus with other circulatory complications: Secondary | ICD-10-CM | POA: Diagnosis present

## 2016-01-31 DIAGNOSIS — K7581 Nonalcoholic steatohepatitis (NASH): Secondary | ICD-10-CM

## 2016-01-31 DIAGNOSIS — R600 Localized edema: Secondary | ICD-10-CM

## 2016-01-31 DIAGNOSIS — R42 Dizziness and giddiness: Secondary | ICD-10-CM | POA: Diagnosis not present

## 2016-01-31 DIAGNOSIS — E119 Type 2 diabetes mellitus without complications: Secondary | ICD-10-CM | POA: Diagnosis not present

## 2016-01-31 DIAGNOSIS — Z7901 Long term (current) use of anticoagulants: Secondary | ICD-10-CM

## 2016-01-31 DIAGNOSIS — K625 Hemorrhage of anus and rectum: Secondary | ICD-10-CM

## 2016-01-31 DIAGNOSIS — R1310 Dysphagia, unspecified: Secondary | ICD-10-CM

## 2016-01-31 DIAGNOSIS — R5381 Other malaise: Secondary | ICD-10-CM

## 2016-01-31 DIAGNOSIS — R21 Rash and other nonspecific skin eruption: Secondary | ICD-10-CM

## 2016-01-31 DIAGNOSIS — R001 Bradycardia, unspecified: Secondary | ICD-10-CM | POA: Diagnosis present

## 2016-01-31 DIAGNOSIS — I10 Essential (primary) hypertension: Secondary | ICD-10-CM

## 2016-01-31 NOTE — Progress Notes (Signed)
Transfer from first [facility] in [place], [place] Patient is a 83 year old with past medical history of HTN, HLD, invasive adenocarcinoma of stomach cancer, diabetes mellitus type 1; who initially presented with complaints of a frontal headache with nausea, vomiting, and lightheadedness. Denies cough or shortness of breath. CT of the brain was negative. Subsequently, patient noted some chest discomfort. Initial troponin 0.08 and repeat troponin 0.12. Chest x-ray showed signs of pulmonary edema versus pneumonia. D-dimer was elevated at 7.11. Patient was in the process of having a CT angiogram of the chest. Vital signs were otherwise noted to be stable. Cardiology was notified and recommended admission to hospitalist service. Will need to formally consult upon arrival at warranted

## 2016-01-31 NOTE — Progress Notes (Signed)
Patient just arrived via EMS.  Patient denies chest pain/tightness/discomfort at this time. [facility] admissions pager paged with all information in this note.

## 2016-02-01 ENCOUNTER — Encounter (HOSPITAL_COMMUNITY): Payer: Self-pay | Admitting: *Deleted

## 2016-02-01 ENCOUNTER — Observation Stay (HOSPITAL_COMMUNITY): Payer: Medicare Other

## 2016-02-01 ENCOUNTER — Observation Stay (HOSPITAL_BASED_OUTPATIENT_CLINIC_OR_DEPARTMENT_OTHER): Payer: Medicare Other

## 2016-02-01 DIAGNOSIS — E7800 Pure hypercholesterolemia, unspecified: Secondary | ICD-10-CM | POA: Diagnosis not present

## 2016-02-01 DIAGNOSIS — Z7901 Long term (current) use of anticoagulants: Secondary | ICD-10-CM | POA: Diagnosis not present

## 2016-02-01 DIAGNOSIS — J189 Pneumonia, unspecified organism: Secondary | ICD-10-CM

## 2016-02-01 DIAGNOSIS — I10 Essential (primary) hypertension: Secondary | ICD-10-CM | POA: Diagnosis not present

## 2016-02-01 DIAGNOSIS — Z794 Long term (current) use of insulin: Secondary | ICD-10-CM | POA: Diagnosis not present

## 2016-02-01 DIAGNOSIS — C169 Malignant neoplasm of stomach, unspecified: Secondary | ICD-10-CM | POA: Diagnosis not present

## 2016-02-01 DIAGNOSIS — R9439 Abnormal result of other cardiovascular function study: Secondary | ICD-10-CM | POA: Diagnosis not present

## 2016-02-01 DIAGNOSIS — R0789 Other chest pain: Secondary | ICD-10-CM | POA: Diagnosis not present

## 2016-02-01 DIAGNOSIS — Z8500 Personal history of malignant neoplasm of unspecified digestive organ: Secondary | ICD-10-CM | POA: Diagnosis not present

## 2016-02-01 DIAGNOSIS — R079 Chest pain, unspecified: Secondary | ICD-10-CM

## 2016-02-01 DIAGNOSIS — E1142 Type 2 diabetes mellitus with diabetic polyneuropathy: Secondary | ICD-10-CM | POA: Diagnosis not present

## 2016-02-01 DIAGNOSIS — J8410 Pulmonary fibrosis, unspecified: Secondary | ICD-10-CM | POA: Diagnosis not present

## 2016-02-01 DIAGNOSIS — D649 Anemia, unspecified: Secondary | ICD-10-CM | POA: Diagnosis present

## 2016-02-01 DIAGNOSIS — Z86718 Personal history of other venous thrombosis and embolism: Secondary | ICD-10-CM

## 2016-02-01 DIAGNOSIS — R001 Bradycardia, unspecified: Secondary | ICD-10-CM | POA: Diagnosis present

## 2016-02-01 DIAGNOSIS — F411 Generalized anxiety disorder: Secondary | ICD-10-CM | POA: Diagnosis not present

## 2016-02-01 LAB — GLUCOSE, CAPILLARY
GLUCOSE-CAPILLARY: 120 mg/dL — AB (ref 65–99)
GLUCOSE-CAPILLARY: 141 mg/dL — AB (ref 65–99)
GLUCOSE-CAPILLARY: 132 mg/dL — AB (ref 65–99)
GLUCOSE-CAPILLARY: 200 mg/dL — AB (ref 65–99)
GLUCOSE-CAPILLARY: 171 mg/dL — AB (ref 65–99)
Glucose-Capillary: 348 mg/dL — ABNORMAL HIGH (ref 65–99)
Glucose-Capillary: 300 mg/dL — ABNORMAL HIGH (ref 65–99)

## 2016-02-01 LAB — LIPID PANEL
CHOL/HDL RATIO: 3.1 ratio
Cholesterol: 103 mg/dL (ref 0–200)
HDL: 33 mg/dL — ABNORMAL LOW (ref >40)
LDL Cholesterol: 41 mg/dL (ref 0–99)
Triglycerides: 145 mg/dL (ref <150)
VLDL: 29 mg/dL (ref 0–40)

## 2016-02-01 LAB — NM MYOCAR MULTI W/SPECT W/WALL MOTION / EF
CSEPED: 0 min
Estimated workload: 1 METS
Exercise duration (sec): 0 s
MPHR: 140 {beats}/min
Peak HR: 91 {beats}/min
Percent HR: 65 %
Rest HR: 57 {beats}/min

## 2016-02-01 LAB — BASIC METABOLIC PANEL
ANION GAP: 4 — AB (ref 5–15)
BUN: 15 mg/dL (ref 6–20)
CALCIUM: 9.9 mg/dL (ref 8.9–10.3)
CO2: 29 mmol/L (ref 22–32)
Chloride: 108 mmol/L (ref 101–111)
Creatinine, Ser: 0.76 mg/dL (ref 0.44–1.00)
GLUCOSE: 161 mg/dL — AB (ref 65–99)
POTASSIUM: 3.9 mmol/L (ref 3.5–5.1)
SODIUM: 141 mmol/L (ref 135–145)

## 2016-02-01 LAB — TROPONIN I
TROPONIN I: 0.1 ng/mL — AB (ref <0.03)
TROPONIN I: 0.11 ng/mL — AB (ref <0.03)
TROPONIN I: 0.1 ng/mL — AB (ref <0.03)
TROPONIN I: 0.11 ng/mL — AB (ref <0.03)
Troponin I: 0.11 ng/mL (ref <0.03)

## 2016-02-01 LAB — CBC
HCT: 36.1 % (ref 36.0–46.0)
HEMOGLOBIN: 11.5 g/dL — AB (ref 12.0–15.0)
MCH: 28.5 pg (ref 26.0–34.0)
MCHC: 31.9 g/dL (ref 30.0–36.0)
MCV: 89.4 fL (ref 78.0–100.0)
Platelets: 226 10*3/uL (ref 150–400)
RBC: 4.04 MIL/uL (ref 3.87–5.11)
RDW: 13.9 % (ref 11.5–15.5)
WBC: 3.8 10*3/uL — AB (ref 4.0–10.5)

## 2016-02-01 LAB — TSH: TSH: 2.034 u[IU]/mL (ref 0.350–4.500)

## 2016-02-01 MED ORDER — REGADENOSON 0.4 MG/5ML IV SOLN
0.4000 mg | Freq: Once | INTRAVENOUS | Status: DC
  Filled 2016-02-01: qty 5

## 2016-02-01 MED ORDER — SODIUM CHLORIDE 0.9% FLUSH: 3.0000 mL | INTRAVENOUS | Status: DC | PRN

## 2016-02-01 MED ORDER — MIRTAZAPINE 7.5 MG PO TABS
15.0000 mg | ORAL_TABLET | Freq: Every day | ORAL | Status: DC
  Administered 2016-02-01 – 2016-02-02 (×2): 15 mg via ORAL
  Filled 2016-02-01 (×3): qty 2

## 2016-02-01 MED ORDER — METOPROLOL TARTRATE 12.5 MG HALF TABLET
12.5000 mg | ORAL_TABLET | Freq: Two times a day (BID) | ORAL | Status: DC
Start: 2016-02-01 — End: 2016-02-03
  Administered 2016-02-01 – 2016-02-02 (×3): 12.5 mg via ORAL
  Filled 2016-02-01 (×3): qty 1

## 2016-02-01 MED ORDER — INSULIN ASPART 100 UNIT/ML ~~LOC~~ SOLN
4.0000 [IU] | Freq: Three times a day (TID) | SUBCUTANEOUS | Status: DC
  Administered 2016-02-01 – 2016-02-02 (×3): 4 [IU] via SUBCUTANEOUS

## 2016-02-01 MED ORDER — TECHNETIUM TC 99M TETROFOSMIN IV KIT
10.0000 | PACK | Freq: Once | INTRAVENOUS | Status: AC | PRN
  Administered 2016-02-01: 10 via INTRAVENOUS

## 2016-02-01 MED ORDER — SODIUM CHLORIDE 0.9 % WEIGHT BASED INFUSION: 1.0000 mL/kg/h | INTRAVENOUS | Status: DC

## 2016-02-01 MED ORDER — HYDROCODONE-ACETAMINOPHEN 7.5-325 MG PO TABS: 1.0000 | ORAL_TABLET | ORAL | Status: DC | PRN

## 2016-02-01 MED ORDER — REGADENOSON 0.4 MG/5ML IV SOLN
INTRAVENOUS | Status: AC
  Administered 2016-02-01: 0.4 mg
  Filled 2016-02-01: qty 5

## 2016-02-01 MED ORDER — ESOMEPRAZOLE MAGNESIUM 40 MG PO CPDR: 40.0000 mg | DELAYED_RELEASE_CAPSULE | Freq: Every day | ORAL | Status: DC

## 2016-02-01 MED ORDER — NITROGLYCERIN 0.4 MG SL SUBL: 0.4000 mg | SUBLINGUAL_TABLET | SUBLINGUAL | Status: DC | PRN

## 2016-02-01 MED ORDER — PANTOPRAZOLE SODIUM 40 MG PO TBEC
40.0000 mg | DELAYED_RELEASE_TABLET | Freq: Every day | ORAL | Status: DC
  Administered 2016-02-01 – 2016-02-02 (×2): 40 mg via ORAL
  Filled 2016-02-01 (×3): qty 1

## 2016-02-01 MED ORDER — ROSUVASTATIN CALCIUM 10 MG PO TABS
10.0000 mg | ORAL_TABLET | Freq: Every day | ORAL | Status: DC
  Administered 2016-02-01 – 2016-02-02 (×2): 10 mg via ORAL
  Filled 2016-02-01 (×3): qty 1

## 2016-02-01 MED ORDER — INSULIN GLARGINE 100 UNIT/ML ~~LOC~~ SOLN
25.0000 [IU] | Freq: Every day | SUBCUTANEOUS | Status: DC
  Administered 2016-02-02: 25 [IU] via SUBCUTANEOUS
  Filled 2016-02-01 (×4): qty 0.25

## 2016-02-01 MED ORDER — SODIUM CHLORIDE 0.9 % IV SOLN: 250.0000 mL | INTRAVENOUS | Status: DC | PRN

## 2016-02-01 MED ORDER — METOPROLOL TARTRATE 25 MG PO TABS
25.0000 mg | ORAL_TABLET | Freq: Two times a day (BID) | ORAL | Status: DC
Start: 2016-02-01 — End: 2016-02-01

## 2016-02-01 MED ORDER — TECHNETIUM TC 99M TETROFOSMIN IV KIT
30.0000 | PACK | Freq: Once | INTRAVENOUS | Status: AC | PRN
  Administered 2016-02-01: 30 via INTRAVENOUS

## 2016-02-01 MED ORDER — SODIUM CHLORIDE 0.9% FLUSH
3.0000 mL | Freq: Two times a day (BID) | INTRAVENOUS | Status: DC
  Administered 2016-02-01 – 2016-02-02 (×2): 3 mL via INTRAVENOUS

## 2016-02-01 MED ORDER — ENSURE ENLIVE PO LIQD
237.0000 mL | Freq: Two times a day (BID) | ORAL | Status: DC
  Administered 2016-02-01: 237 mL via ORAL

## 2016-02-01 MED ORDER — SODIUM CHLORIDE 0.9 % WEIGHT BASED INFUSION
3.0000 mL/kg/h | INTRAVENOUS | Status: DC
  Administered 2016-02-02: 3 mL/kg/h via INTRAVENOUS

## 2016-02-01 MED ORDER — CLOPIDOGREL BISULFATE 75 MG PO TABS
75.0000 mg | ORAL_TABLET | Freq: Every day | ORAL | Status: DC
  Administered 2016-02-01 – 2016-02-02 (×2): 75 mg via ORAL
  Filled 2016-02-01 (×3): qty 1

## 2016-02-01 MED ORDER — ACETAMINOPHEN 325 MG PO TABS
650.0000 mg | ORAL_TABLET | ORAL | Status: DC | PRN
  Administered 2016-02-01: 650 mg via ORAL
  Filled 2016-02-01: qty 2

## 2016-02-01 MED ORDER — ASPIRIN 81 MG PO CHEW
81.0000 mg | CHEWABLE_TABLET | ORAL | Status: AC
  Administered 2016-02-02: 81 mg via ORAL
  Filled 2016-02-01: qty 1

## 2016-02-01 MED ORDER — ENOXAPARIN SODIUM 30 MG/0.3ML ~~LOC~~ SOLN
30.0000 mg | SUBCUTANEOUS | Status: DC
  Administered 2016-02-01: 30 mg via SUBCUTANEOUS
  Filled 2016-02-01 (×2): qty 0.3

## 2016-02-01 MED ORDER — INSULIN ASPART 100 UNIT/ML ~~LOC~~ SOLN
0.0000 [IU] | Freq: Three times a day (TID) | SUBCUTANEOUS | Status: DC
  Administered 2016-02-01: 3 [IU] via SUBCUTANEOUS
  Administered 2016-02-01: 8 [IU] via SUBCUTANEOUS
  Administered 2016-02-02 (×2): 2 [IU] via SUBCUTANEOUS

## 2016-02-01 MED ORDER — NITROGLYCERIN 0.4 MG SL SUBL
0.4000 mg | SUBLINGUAL_TABLET | SUBLINGUAL | Status: DC | PRN
Start: 2016-02-01 — End: 2016-02-01

## 2016-02-01 MED ORDER — ONDANSETRON HCL 4 MG/2ML IJ SOLN: 4.0000 mg | Freq: Four times a day (QID) | INTRAMUSCULAR | Status: DC | PRN

## 2016-02-01 NOTE — Care Management Obs Status (Signed)
[facility] NOTIFICATION   Patient Details  Name: Tammy Hughes MRN: [id number] Date of Birth: September 22, 1932   Medicare Observation Status Notification Given:  Yes    Bethena Roys, RN 02/01/2016, 4:30 PM

## 2016-02-01 NOTE — Progress Notes (Addendum)
  1-day Nuclear Stress Test performed, images and final report pending. Being read by [facility]iology.  Signed, Erma Heritage, PA-C 02/01/2016, 12:54 PM Pager: [phone]  Stress Test Results as follows:  There was no ST segment deviation noted during stress.  T wave inversion of 1 mm was noted during stress in the aVR and V1 leads, beginning at 0 minutes of stress. T wave inversion persisted.  Defect 1: There is a medium defect of mild severity present in the basal inferolateral and mid inferolateral location.  Findings consistent with ischemia.  The left ventricular ejection fraction is hyperdynamic (>65%).  Nuclear stress EF: 86%.  This is an intermediate risk study.  Results reviewed with the patient by Dr. Harrington Challenger. Cardiac catheterization recommended and the pateint agrees to proceed. Risks and benefits addressed by Dr. Harrington Challenger. Added to the cath board for 02/02/2016 with Dr. Irish Lack.  Signed, Erma Heritage, PA-C 02/01/2016, 5:28 PM Pager: [phone]

## 2016-02-01 NOTE — Consult Note (Signed)
Cardiology Consult    Patient ID: Tammy Hughes MRN: [id number], DOB/AGE: 08-02-1932   Admit date: 01/31/2016 Date of Consult: 02/01/2016  Primary Physician: Renato Shin, MD Reason for Consult: Chest Pain, Elevated Troponin Primary Cardiologist: Dr. Debara Pickett Requesting Provider: Dr. Tana Coast   History of Present Illness    Tammy Hughes is a 82 y.o. female with past medical history of IDDM, HLD, and gastric cancer (currently in remission) who presented to an OSH on 7/19 with a headache, nausea, and vomiting.   She reports being in usual state of health until she developed a headache 3 days ago (7/17). The headache persisted until 7/19 and would not subside with OTC medications. During the morning of 7/19, she developed nausea and vomiting and decided to go to the ED. She took a shower to prepare for this and developed chest pressure along her sternum and right side, which she describes as a pressure lasting for less than 3 minutes and resolving spontaneously.  She denies any repeat episodes of chest discomfort since. Reports being active at baseline and denies any chest discomfort or dyspnea with exertion over the past few weeks. Has been performing yard work in the heat for the past several days and says she felt "worn-out" due to the heat but denies any specific symptoms.   Labs at the outside facility showed a WBC of 3.4, Hgb 11.9, platelets 216. K+ 4.2, creatinine 0.70. Troponin 0.08 and 0.12. D-dimer 7.21. Head CT with no acute intracranial abnormalities. CT Chest with no PE noted nor aortic dissection. Calcification in the aorta and coronary vessels was present. Peripheral interstitial markings consistent with chronic interstitial fibrosis was present as well. EKG showed sinus bradycardia, HR 54 with 1st degree AV block, TWI in V1 and V2 (similar to previous tracings).  Troponin values here have been 0.11 and 0.10, similar to values obtained in 2016. During that hospitalization she had  some episodes of NSVT noted on telemetry and an outpatient NST was obtained in 12/2014 which showed no evidence of ischemia. EF was estimated at 63%.  She denies any prior cardiac catheterizations. No known MI's. No family history of CAD. Not a current or former smoker.  Past Medical History   Past Medical History  Diagnosis Date  . HYPERCHOLESTEROLEMIA 02/01/2008  . ANXIETY 03/08/2008  . PERIPHERAL NEUROPATHY 02/23/2007  . HYPERTENSION 03/08/2008  . GERD 02/23/2007  . DIVERTICULOSIS, COLON 03/08/2008  . OSTEOARTHRITIS 02/23/2007  . OSTEOPOROSIS 02/23/2007  . Urolithiasis   . Allergy     SEASONAL  . Cataract     REMOVED BILATERAL  . Depression   . Stomach cancer ([place]) 07/20/14    invasive adenocarcinoma w/signet rings features  . DIABETES MELLITUS, TYPE I 02/23/2007  . DVT (deep venous thrombosis) [facility])     Past Surgical History  Procedure Laterality Date  . Cholecystectomy  1995  . Esophagogastroduodenoscopy  06/08/2002  . Gastrectomy N/A 07/20/2014    Procedure: PROXIMAL GASTRECTOMY;  Surgeon: Stark Klein, MD;  Location: [facility];  Service: General;  Laterality: N/A;  . Laparoscopy N/A 07/20/2014    Procedure: LAPAROSCOPY DIAGNOSTIC;  Surgeon: Stark Klein, MD;  Location: [facility];  Service: General;  Laterality: N/A;  . Gastrojejunostomy N/A 07/20/2014    Procedure: GASTROJEJUNOSTOMY;  Surgeon: Stark Klein, MD;  Location: [facility];  Service: General;  Laterality: N/A;     Allergies  Allergies  Allergen Reactions  . Pirfenidone Diarrhea and Nausea And Vomiting  . Aspirin     rash  Inpatient Medications    . clopidogrel  75 mg Oral Daily  . enoxaparin (LOVENOX) injection  30 mg Subcutaneous Q24H  . feeding supplement (ENSURE ENLIVE)  237 mL Oral BID BM  . insulin aspart  0-15 Units Subcutaneous TID WC  . insulin aspart  4 Units Subcutaneous TID WC  . insulin glargine  25 Units Subcutaneous QHS  . metoprolol tartrate  25 mg Oral BID  . mirtazapine  15 mg Oral QHS  . pantoprazole   40 mg Oral Daily  . rosuvastatin  10 mg Oral Daily    Family History    Family History  Problem Relation Age of Onset  . Cancer Father     Prostate Cancer  . Cancer Sister     Breast Cancer  . Diabetes Sister   . Diabetes Sister   . Other Mother     pneumonia   . Cancer Sister     breast cancer     Social History    Social History   Social History  . Marital Status: Married    Spouse Name: N/A  . Number of Children: N/A  . Years of Education: N/A   Occupational History  . Retired    Social History Main Topics  . Smoking status: Never Smoker   . Smokeless tobacco: Never Used  . Alcohol Use: No  . Drug Use: No  . Sexual Activity: Not on file   Other Topics Concern  . Not on file   Social History Narrative   Dentist           Review of Systems    General:  No chills, fever, night sweats or weight changes.  Cardiovascular:  No dyspnea on exertion, edema, orthopnea, palpitations, paroxysmal nocturnal dyspnea. Positive for chest pain.  Dermatological: No rash, lesions/masses Respiratory: No cough, dyspnea Urologic: No hematuria, dysuria Abdominal:   No diarrhea, bright red blood per rectum, melena, or hematemesis. Positive for nausea and vomiting. Neurologic:  No visual changes, wkns, changes in mental status. Positive for headache. All other systems reviewed and are otherwise negative except as noted above.  Physical Exam    Blood pressure 127/62, pulse 54, temperature 97.8 F (36.6 C), temperature source Oral, resp. rate 21, height 5' (1.524 m), weight 123 lb (55.792 kg), SpO2 98 %.  General: Pleasant, elderly female appearing in NAD. Psych: Normal affect. Neuro: Alert and oriented X 3. Moves all extremities spontaneously. HEENT: Normal  Neck: Supple without bruits or JVD. Lungs:  Resp regular and unlabored, CTA without wheezing or rales. Heart: RRR no s3, s4, or murmurs. Abdomen: Soft, non-tender, non-distended, BS + x 4.    Extremities: No clubbing, cyanosis or edema. DP/PT/Radials 2+ and equal bilaterally.  Labs    Troponin (Point of Care Test) No results for input(s): TROPIPOC in the last 72 hours.  Recent Labs  02/01/16 0308 02/01/16 0634  TROPONINI 0.11* 0.10*   Lab Results  Component Value Date   WBC 3.8* 02/01/2016   HGB 11.5* 02/01/2016   HCT 36.1 02/01/2016   MCV 89.4 02/01/2016   PLT 226 02/01/2016    Recent Labs Lab 02/01/16 0634  NA 141  K 3.9  CL 108  CO2 29  BUN 15  CREATININE 0.76  CALCIUM 9.9  GLUCOSE 161*   Lab Results  Component Value Date   CHOL 103 02/01/2016   HDL 33* 02/01/2016   LDLCALC 41 02/01/2016   TRIG 145 02/01/2016   No results found for: DDIMER  Radiology Studies    No results found.  EKG & Cardiac Imaging    EKG: Sinus bradycardia, HR 54 with 1st degree AV block, TWI in V1 and V2 (similar to previous tracings).  Echocardiogram: 07/2014 Study Conclusions  - Left ventricle: The cavity size was normal. Systolic function was normal. The estimated ejection fraction was in the range of 55% to 60%. Wall motion was normal; there were no regional wall motion abnormalities. - Atrial septum: No defect or patent foramen ovale was identified. - Pulmonary arteries: PA peak pressure: 33 mm Hg (S).  Assessment & Plan    1. Chest Pain/ Elevated Troponin - presented initially for evaluation of persistent headache, present for 3 days. Reported having an episode of chest discomfort, lasting for 3 minutes while in the shower earlier that day. No prior episodes of chest discomfort or dyspnea with exertion.  - cyclic troponin values have been 0.08, 0.12, 0.11, and 0.10. EKG without acute ischemic changes but CTA with coronary calcifications. Flat trend of troponin values similar to 2016 when she had episodes of NSVT. NST in 12/2014 showed no evidence of ischemia and EF was estimated at 63%. - overall, her chest discomfort seems atypical, yet she continues  to have these mild troponin elevations during subsequent hospitalizations. Has never had a definitive cardiac catheterization, and may benefit from this as NST was normal with her mild troponin elevation last admission. Will discuss with MD.  2. Sinus Bradycardia - HR in high-40's - mid-50's overnight. - will decrease Lopressor dosing from 25mg  BID to 12.5mg  BID.  3. IDDM - per admitting team  4. Elevated D-dimer - history of remote DVT in 2016. - CTA negative for PE.   5. History of Gastric Cancer - s/p partial resection - currently in remission.   Signed, Erma Heritage, PA-C 02/01/2016, 8:13 AM Pager: [phone]  Patient seen and examined  I agree with findings as noted by B Strader above   Pt with epigastric pain (transient) and also HA (prolonged) Admiteed  CT showed on PE (D Dimer elevated)  Troponin minimally inceased with rel flat progression  NOte that she has had this in the past  Pt is not that active ON exam, comfortable laying flat  Lungs CTA  Cardiac RRR  No S3  Ext without edema  I would recomm a Lexiscan myoview to r/o inducible ischemia  Had one in 2016  She does have evid of CAD on CT scan.   Contiue other meds including statin.   Dorris Carnes

## 2016-02-01 NOTE — H&P (Signed)
History and Physical    BRIEONA [facility] [id number] DOB: 01/13/1933 DOA: 01/31/2016  PCP: Renato Shin, MD   Patient coming from: [facility] in [place], [place]    Chief Complaint: Headache, chest discomfort   HPI: MEHWISH OLAH is a 82 y.o. female with medical history significant for gastric cancer status post resection and radiation now in remission, hypertension, hyperlipidemia, and insulin-dependent diabetes mellitus who presents in transfer from [facility] in [place], [place] for evaluation and management of chest discomfort with elevated troponin. Patient had reportedly been in her usual state of health until she noted the insidious development of a mild frontal headache on 01/29/2016. Headache persisted, worsening slightly, and prompted her to seek evaluation in the emergency department in [place]. While there, she noted that she had experienced a brief episode of left sided chest discomfort earlier in the day and a cardiac workup was performed.  Chest discomfort was described as a "tightness" across the anterior left chest while she was getting out of the shower the morning of 01/31/2016. Symptoms lasted several minutes before spontaneously resolving. She had not experienced similar symptoms previously and underwent a negative stress test 1 year ago. She has been taking Plavix daily, as well as Crestor and Lopressor. She denies any dyspnea or diaphoresis, but had some nausea and nonbloody nonbilious vomiting associated with the headache. Patient reports that the headache and associated nausea has resolved following treatment at the outside hospital.    ED Course: Upon arrival to the  outside hospital ED in [place], patient was found to be  afebrile, saturating well on room air, bradycardic in the 50s, but with vitals otherwise stable. She underwent a noncontrast head CT which was negative for acute intracranial abnormality and chest x-ray which was notable for bilateral opacities  suggestive of mild edema versus atypical pneumonia. Chemistry panel was largely unremarkable and CBC was notable for a stable normocytic anemia. Initial troponin was 0.08 and BNP was 50. Patient was treated with 324 mg aspirin chew, which she tolerated without incident despite the reported allergy. 6 hours later, a second troponin returned elevated further to 0.12. D-dimer was also obtained at the outside hospital and returned elevated, prompting CT angiogram PE study. CTA was negative for PE or aortic dissection. Given the elevated and increasing troponin, and lack of cardiology services at the outside hospital, patient was accepted in transfer to [facility].  Patient is interviewed and examined on the telemetry unit at [facility] where she remains afebrile and with stable vitals. She has not had any chest pain since the one episode yesterday morning. She will be observed on telemetry with ongoing evaluation and management of transient chest discomfort with elevated troponin concerning for possible ACS.  Review of Systems:  All other systems reviewed and apart from HPI, are negative.  Past Medical History  Diagnosis Date  . HYPERCHOLESTEROLEMIA 02/01/2008  . ANXIETY 03/08/2008  . PERIPHERAL NEUROPATHY 02/23/2007  . HYPERTENSION 03/08/2008  . GERD 02/23/2007  . DIVERTICULOSIS, COLON 03/08/2008  . OSTEOARTHRITIS 02/23/2007  . OSTEOPOROSIS 02/23/2007  . Urolithiasis   . Allergy     SEASONAL  . Cataract     REMOVED BILATERAL  . Depression   . Stomach cancer ([place]) 07/20/14    invasive adenocarcinoma w/signet rings features  . DIABETES MELLITUS, TYPE I 02/23/2007  . DVT (deep venous thrombosis) [facility])     Past Surgical History  Procedure Laterality Date  . Cholecystectomy  1995  . Esophagogastroduodenoscopy  06/08/2002  .  Gastrectomy N/A 07/20/2014    Procedure: PROXIMAL GASTRECTOMY;  Surgeon: Stark Klein, MD;  Location: [facility];  Service: General;  Laterality: N/A;  . Laparoscopy N/A  07/20/2014    Procedure: LAPAROSCOPY DIAGNOSTIC;  Surgeon: Stark Klein, MD;  Location: [facility];  Service: General;  Laterality: N/A;  . Gastrojejunostomy N/A 07/20/2014    Procedure: GASTROJEJUNOSTOMY;  Surgeon: Stark Klein, MD;  Location: [facility];  Service: General;  Laterality: N/A;     reports that she has never smoked. She has never used smokeless tobacco. She reports that she does not drink alcohol or use illicit drugs.  Allergies  Allergen Reactions  . Pirfenidone Diarrhea and Nausea And Vomiting  . Aspirin     rash    Family History  Problem Relation Age of Onset  . Cancer Father     Prostate Cancer  . Cancer Sister     Breast Cancer  . Diabetes Sister   . Diabetes Sister   . Other Mother     pneumonia   . Cancer Sister     breast cancer      Prior to Admission medications   Medication Sig Start Date End Date Taking? Authorizing Provider  BAYER CONTOUR TEST test strip USE ONE STRIP TO CHECK GLUCOSE THREE TIMES DAILY 09/26/15   Renato Shin, MD  clopidogrel (PLAVIX) 75 MG tablet Take 1 tablet (75 mg total) by mouth daily. 08/24/15   Renato Shin, MD  Dexlansoprazole 30 MG capsule Take 1 capsule (30 mg total) by mouth daily. 08/25/15   Renato Shin, MD  HYDROcodone-acetaminophen (HYCET) 7.5-325 mg/15 ml solution Take 15 mLs by mouth every 4 (four) hours as needed for moderate pain. 10/25/14   Renato Shin, MD  insulin aspart protamine - aspart (NOVOLOG MIX 70/30 FLEXPEN) (70-30) 100 UNIT/ML FlexPen 35 units with breakfast, and 15 units with supper, and pen needles 2/day 11/13/15   Renato Shin, MD  Insulin Pen Needle 31G X 5 MM MISC Use to inject insulin 3 times per day. 01/30/15   Renato Shin, MD  Lactulose 20 GM/30ML SOLN Take 30 mLs (20 g total) by mouth 4 (four) times daily. Patient not taking: Reported on 10/31/2015 04/18/15   Renato Shin, MD  megestrol (MEGACE ES) 625 MG/5ML suspension TAKE 1 TEASPOONFUL BY MOUTH DAILY AS DIRECTED 05/11/15   Renato Shin, MD  metoprolol tartrate  (LOPRESSOR) 25 MG tablet TAKE 1 TABLET TWICE A DAY 08/23/15   Renato Shin, MD  mirtazapine (REMERON) 15 MG tablet TAKE 1 TABLET BY MOUTH EACH NIGHT AT BEDTIME IF NEEDED 01/08/16   Renato Shin, MD  rosuvastatin (CRESTOR) 10 MG tablet Take 1 tablet (10 mg total) by mouth daily. 08/24/15   Renato Shin, MD  traMADol (ULTRAM) 50 MG tablet Take 1 tablet (50 mg total) by mouth every 6 (six) hours as needed. 01/05/15   Daleen Bo, MD    Physical Exam: Filed Vitals:   01/31/16 2344 01/31/16 2350  BP:  156/64  Pulse:  51  Temp:  97.6 F (36.4 C)  TempSrc:  Axillary  Resp:  20  Height: 5' (1.524 m)   Weight: 55.566 kg (122 lb 8 oz)   SpO2:  97%      Constitutional: NAD, calm, comfortable Eyes: PERTLA, lids and conjunctivae normal ENMT: Mucous membranes are moist. Posterior pharynx clear of any exudate or lesions.   Neck: normal, supple, no masses, no thyromegaly Respiratory: clear to auscultation bilaterally, no wheezing, no crackles. Normal respiratory effort.   Cardiovascular: S1 &  S2 heard, regular rate and rhythm. No extremity edema. No carotid bruits. No significant JVD. Abdomen: No distension, no tenderness, no masses palpated. Bowel sounds normal.  Musculoskeletal: no clubbing / cyanosis. No joint deformity upper and lower extremities. Normal muscle tone.  Skin: no significant rashes, lesions, ulcers. Warm, dry, well-perfused. Neurologic: CN 2-12 grossly intact. Sensation intact, DTR normal. Strength 5/5 in all 4 limbs.  Psychiatric: Normal judgment and insight. Alert and oriented x 3. Normal mood and affect.     Labs on Admission: I have personally reviewed following labs and imaging studies  CBC: No results for input(s): WBC, NEUTROABS, HGB, HCT, MCV, PLT in the last 168 hours. Basic Metabolic Panel: No results for input(s): NA, K, CL, CO2, GLUCOSE, BUN, CREATININE, CALCIUM, MG, PHOS in the last 168 hours. GFR: CrCl cannot be calculated (Patient has no serum creatinine result  on file.). Liver Function Tests: No results for input(s): AST, ALT, ALKPHOS, BILITOT, PROT, ALBUMIN in the last 168 hours. No results for input(s): LIPASE, AMYLASE in the last 168 hours. No results for input(s): AMMONIA in the last 168 hours. Coagulation Profile: No results for input(s): INR, PROTIME in the last 168 hours. Cardiac Enzymes: No results for input(s): CKTOTAL, CKMB, CKMBINDEX, TROPONINI in the last 168 hours. BNP (last 3 results) No results for input(s): PROBNP in the last 8760 hours. HbA1C: No results for input(s): HGBA1C in the last 72 hours. CBG: No results for input(s): GLUCAP in the last 168 hours. Lipid Profile: No results for input(s): CHOL, HDL, LDLCALC, TRIG, CHOLHDL, LDLDIRECT in the last 72 hours. Thyroid Function Tests: No results for input(s): TSH, T4TOTAL, FREET4, T3FREE, THYROIDAB in the last 72 hours. Anemia Panel: No results for input(s): VITAMINB12, FOLATE, FERRITIN, TIBC, IRON, RETICCTPCT in the last 72 hours. Urine analysis:    Component Value Date/Time   COLORURINE YELLOW 01/25/2015 1551   APPEARANCEUR Sl Cloud[place]* 01/25/2015 1551   LABSPEC 1.020 01/25/2015 1551   PHURINE 5.5 01/25/2015 1551   GLUCOSEU NEGATIVE 01/25/2015 1551   GLUCOSEU 250* 07/29/2014 2317   HGBUR NEGATIVE 01/25/2015 1551   HGBUR negative 06/06/2009 1442   BILIRUBINUR NEGATIVE 01/25/2015 1551   KETONESUR NEGATIVE 01/25/2015 1551   PROTEINUR NEGATIVE 07/29/2014 2317   UROBILINOGEN 0.2 01/25/2015 1551   NITRITE NEGATIVE 01/25/2015 1551   LEUKOCYTESUR SMALL* 01/25/2015 1551   Sepsis Labs: @LABRCNTIP (procalcitonin:4,lacticidven:4) )No results found for this or any previous visit (from the past 240 hour(s)).   Radiological Exams on Admission: No results found.  EKG: Independently reviewed. Sinus bradycardia (rate 54), first degree AV block   Assessment/Plan  1. Chest pain - Pt experienced chest discomfort the am of 7/19 which resolved spontaneously  - Initial troponin  at outside hospital elevated to 0.08; 6 hrs later, second troponin 0.12  - No ischemic changes on initial EKG from outside hospital  - CXR with non-specific b/l opacities, though to represent mild edema vs atypical PNA, though no other findings suggestive of fluid o/l or infectious process  - Given lack of cardiology services at outside hospital, pt was accepted in transfer  - There has been no CP, SOB, diaphoresis, or nausea since prior to the transfer  - Etiology of the CP is uncertain, and does not seem consistent with ACS, but troponin is elevated - ASA 324 mg administered prior to transfer, tolerated well despite reported allergy  - Continue Lopressor, Crestor, and Plavix - Monitor on telemetry for ischemic changes - Continue serial troponin measurements; if pain recurs or troponin continues to  rise, will initiate heparin infusion   2. Headache with nausea, resolved  - Head CT obtained at outside hospital and reassuring  - No focal neurologic deficits appreciated   3. Hypertension  - BP minimally elevated at time of admission  - Continue current management with Lopressor    4. Type II DM  - A1c 8.1% in May 2017, reflecting sub-optimal glycemic-control at that time  - Managed at home with Novolog 70/30, 35 units qAM and 15 units qPM; hold this while in hospital  - Check CBG with meals and qHS  - Basal coverage with Lantus 25 units qHS, mealtime coverage 4 units, and moderate-intensity SSI    5. Normocytic anemia  - Hgb 11.9 on admission, stable relative to priors  - No sign of active blood-loss    6. Gastric cancer  - In remission  - Status-post resection and radiation - Feeding tube pulled and tolerated PO without incident    DVT prophylaxis: sq Lovenox  Code Status: Full  Family Communication: Family updated at bedside  Disposition Plan: Observe on telemetry  Consults called: None  Admission status: Observation     Vianne Bulls, MD [facility] Pager  [phone]  If 7PM-7AM, please contact night-coverage [email or web address] Password [email or web address]  02/01/2016, 2:00 AM

## 2016-02-01 NOTE — Progress Notes (Signed)
[facility] responded to consult for prayer. The patient specifically wanted prayer for her health, and a recommitment to her spiritual growth. I offered a prayer on her behalf for these specific things. I am available for follow up if needed.]  Graciela Husbands    02/01/16 1000  Clinical Encounter Type  Visited With Patient  Visit Type Initial;Spiritual support  Referral From Nurse  Spiritual Encounters  Spiritual Needs Prayer;Emotional;Grief support

## 2016-02-01 NOTE — Progress Notes (Signed)
Initial Nutrition Assessment  DOCUMENTATION CODES:   Not applicable  INTERVENTION:    High Protein snacks TID between meals.  Magic cup TID with meals, each supplement provides 290 kcal and 9 grams of protein  NUTRITION DIAGNOSIS:   Inadequate oral intake related to chronic illness, poor appetite as evidenced by per patient/family report, mild depletion of muscle mass, moderate depletions of muscle mass.  GOAL:   Patient will meet greater than or equal to 90% of their needs  MONITOR:   PO intake, Labs, Weight trends, I & O's  REASON FOR ASSESSMENT:   Malnutrition Screening Tool    ASSESSMENT:   82 y.o. female with gastric cancer status post resection and radiation now in remission, hypertension, hyperlipidemia, and insulin-dependent diabetes mellitus who presents in transfer from [facility] in [place], [place] for evaluation and management of chest discomfort with elevated troponin.  Labs reviewed. CBG's: [phone] Medications reviewed and include Novolog, Lantus, Remeron Spoke with patient and her daughter. She has been eating small amounts since her gastric surgery. She is only able to consume a few bites at each meal. She is a "grazer." She does not like PO supplements such as Ensure, Boost, or Breeze. She agreed to receive snacks between meals to maximize oral intake.  Nutrition-Focused physical exam completed. Findings are no fat depletion, mild-moderate muscle depletion, and no edema.  Patient with progressive weight loss over the past 1.5 years with usual weight in the 160's, now down to 123 lbs. She has had 15% weight loss within the past year, not significant for the time frame.  Patient is at nutrition risk, given poor oral intake with progressive weight loss, and mild-moderate depletion of muscle mass.  Diet Order:  Diet heart healthy/carb modified Room service appropriate?: Yes; Fluid consistency:: Thin  Skin:  Reviewed, no issues  Last BM:   7/17  Height:   Ht Readings from Last 1 Encounters:  01/31/16 5' (1.524 m)    Weight:   Wt Readings from Last 1 Encounters:  02/01/16 123 lb (55.792 kg)    Ideal Body Weight:  45.5 kg  BMI:  Body mass index is 24.02 kg/(m^2).  Estimated Nutritional Needs:   Kcal:  1400-1600  Protein:  70-80 gm  Fluid:  >/= 1.5 L  EDUCATION NEEDS:   No education needs identified at this time  Molli Barrows, [place], [place], [place] Pager [phone] After Hours Pager [phone]

## 2016-02-01 NOTE — Progress Notes (Signed)
Patient first Troponin since arrival to unit was 0.11.  Per Fuller Plan MD note patient's Troponin(s) at the hospital patient transferred from were 0.08 and 0.12 respectively.  Patient has denied pain since arrival to [place] text paged with this information via [place].

## 2016-02-01 NOTE — Progress Notes (Addendum)
[facility]                                                                              Patient Demographics  Tammy Hughes, is a 82 y.o. female, DOB - Feb 21, 1933, [id number]  Admit date - 01/31/2016   Admitting Physician Vianne Bulls, MD  Outpatient Primary MD for the patient is Renato Shin, MD  Outpatient specialists:   LOS -   days    chief complaint : Chest pain      Brief summary   Tammy Hughes is a 82 y.o. female with gastric cancer status post resection and radiation now in remission, hypertension, hyperlipidemia, and insulin-dependent diabetes mellitus who presents in transfer from [facility] in [place], [place] for evaluation and management of chest discomfort with elevated troponin. Patient had reportedly been in her usual state of health until she noted the insidious development of a mild frontal headache on 01/29/2016. Headache persisted, worsening slightly, and prompted her to seek evaluation in ED in [place]. While there, she noted that she had experienced a brief episode of left sided chest discomfort earlier in the day and cardiac workup was performed. Chest discomfort was described as a "tightness" across the anterior left chest while she was getting out of the shower the morning of 01/31/2016. Symptoms lasted several minutes before spontaneously resolving. She had not experienced similar symptoms previously and underwent a negative stress test 1 year ago. She has been taking Plavix daily, as well as Crestor and Lopressor. She denied any dyspnea or diaphoresis, but had some nausea and nonbloody nonbilious vomiting associated with the headache. Patient reported that the headache and associated nausea has resolved following treatment at the outside hospital.  ED Course: Upon arrival to the outside hospital ED in [place], patient was found to be afebrile, saturating well on room air, bradycardic in the 50s, but with vitals otherwise stable. She underwent  a noncontrast head CT which was negative for acute intracranial abnormality and chest x-ray which was notable for bilateral opacities suggestive of mild edema versus atypical pneumonia. Chemistry panel was largely unremarkable and CBC was notable for a stable normocytic anemia. Initial troponin was 0.08 and BNP was 50. Patient was treated with 324 mg aspirin chew, which she tolerated without incident despite the reported allergy. 6 hours later, a second troponin returned elevated further to 0.12. D-dimer was also obtained at the outside hospital and returned elevated, prompting CT angiogram PE study. CTA was negative for PE or aortic dissection. Given the elevated and increasing troponin, and lack of cardiology services at the outside hospital, patient was transferred to [facility].   Assessment & Plan   Chest pain/NSTEMI: Currently resolved - Troponins +0.1, no ischemic changes on initial EKG - CXR with non-specific b/l opacities, mild edema vs atypical PNA - Cont ASA, Lopressor, Crestor, and Plavix - Cardiology consulted, awaiting further decision regarding ischemia workup Addendum: Stress test reviewed, intermediate study with medium defect of mild severity in the basal inferolateral and mid inferior lateral location, consistent with ischemia.  -will follow cardiology conditions    Headache with nausea, resolved  - Head CT obtained at outside  hospital with no infarct or bleed - No focal neurologic deficits appreciated   Hypertension  - BP minimally elevated at time of admission  - Continue current management with Lopressor    Type II DM  - A1c 8.1% in May 2017, reflecting sub-optimal glycemic-control at that time  - Managed at home with Novolog 70/30, 35 units qAM and 15 units qPM; hold this while in hospital  - Start Lantus 25 units qHS, mealtime coverage 4 units, and moderate-intensity SSI   Normocytic anemia  - Hgb 11.9 on admission, stable relative to priors  - No sign  of active blood-loss    Gastric cancer  - In remission  - Status-post resection and radiation, Feeding tube pulled and tolerated PO without incident    Code Status: Full code DVT Prophylaxis:  Lovenox  Family Communication: Discussed in detail with the patient, all imaging results, lab results explained to the patient  Disposition Plan:   Time Spent in minutes  25 minutes  Procedures:    Consultants:   Cardiology  Antimicrobials:      Medications  Scheduled Meds: . clopidogrel  75 mg Oral Daily  . enoxaparin (LOVENOX) injection  30 mg Subcutaneous Q24H  . feeding supplement (ENSURE ENLIVE)  237 mL Oral BID BM  . insulin aspart  0-15 Units Subcutaneous TID WC  . insulin aspart  4 Units Subcutaneous TID WC  . insulin glargine  25 Units Subcutaneous QHS  . metoprolol tartrate  12.5 mg Oral BID  . mirtazapine  15 mg Oral QHS  . pantoprazole  40 mg Oral Daily  . regadenoson  0.4 mg Intravenous Once  . rosuvastatin  10 mg Oral Daily   Continuous Infusions:  PRN Meds:.acetaminophen, HYDROcodone-acetaminophen, nitroGLYCERIN, ondansetron (ZOFRAN) IV   Antibiotics   Anti-infectives    None        Subjective:   Tammy Hughes was seen and examined today.  Patient denies dizziness, chest pain, shortness of breath, abdominal pain, N/V/D/C, new weakness, numbess, tingling. No acute events overnight.    Objective:   Filed Vitals:   02/01/16 1033 02/01/16 1034 02/01/16 1035 02/01/16 1036  BP:      Pulse:      Temp:      TempSrc:      Resp: 18 20 20 19   Height:      Weight:      SpO2:        Intake/Output Summary (Last 24 hours) at 02/01/16 1126 Last data filed at 02/01/16 0947  Gross per 24 hour  Intake    200 ml  Output    300 ml  Net   -100 ml     Wt Readings from Last 3 Encounters:  02/01/16 55.792 kg (123 lb)  11/13/15 57.788 kg (127 lb 6.4 oz)  10/31/15 58.832 kg (129 lb 11.2 oz)     Exam  General: Alert and oriented x 3, NAD  HEENT:   PERRLA, EOMI, Anicteric Sclera, mucous membranes moist.   Neck: Supple, no JVD, no masses  Cardiovascular: S1 S2 auscultated, no rubs, murmurs or gallops. Regular rate and rhythm.  Respiratory: Clear to auscultation bilaterally, no wheezing, rales or rhonchi  Gastrointestinal: Soft, nontender, nondistended, + bowel sounds  Ext: no cyanosis clubbing or edema  Neuro: AAOx3, Cr N's II- XII. Strength 5/5 upper and lower extremities bilaterally  Skin: No rashes  Psych: Normal affect and demeanor, alert and oriented x3    Data Reviewed:  I have personally reviewed following labs  and imaging studies  Micro Results No results found for this or any previous visit (from the past 240 hour(s)).  Radiology Reports No results found.  Lab Data:  CBC:  Recent Labs Lab 02/01/16 0634  WBC 3.8*  HGB 11.5*  HCT 36.1  MCV 89.4  PLT A999333   Basic Metabolic Panel:  Recent Labs Lab 02/01/16 0634  NA 141  K 3.9  CL 108  CO2 29  GLUCOSE 161*  BUN 15  CREATININE 0.76  CALCIUM 9.9   GFR: Estimated Creatinine Clearance: 43.9 mL/min (by C-G formula based on Cr of 0.76). Liver Function Tests: No results for input(s): AST, ALT, ALKPHOS, BILITOT, PROT, ALBUMIN in the last 168 hours. No results for input(s): LIPASE, AMYLASE in the last 168 hours. No results for input(s): AMMONIA in the last 168 hours. Coagulation Profile: No results for input(s): INR, PROTIME in the last 168 hours. Cardiac Enzymes:  Recent Labs Lab 02/01/16 0308 02/01/16 0634 02/01/16 0923  TROPONINI 0.11* 0.10* 0.11*   BNP (last 3 results) No results for input(s): PROBNP in the last 8760 hours. HbA1C: No results for input(s): HGBA1C in the last 72 hours. CBG:  Recent Labs Lab 02/01/16 0332 02/01/16 0728 02/01/16 1110  GLUCAP 120* 141* 132*   Lipid Profile:  Recent Labs  02/01/16 0634  CHOL 103  HDL 33*  LDLCALC 41  TRIG 145  CHOLHDL 3.1   Thyroid Function Tests:  Recent Labs   02/01/16 0308  TSH 2.034   Anemia Panel: No results for input(s): VITAMINB12, FOLATE, FERRITIN, TIBC, IRON, RETICCTPCT in the last 72 hours. Urine analysis:    Component Value Date/Time   COLORURINE YELLOW 01/25/2015 1551   APPEARANCEUR Sl Cloudy* 01/25/2015 1551   LABSPEC 1.020 01/25/2015 1551   PHURINE 5.5 01/25/2015 1551   GLUCOSEU NEGATIVE 01/25/2015 1551   GLUCOSEU 250* 07/29/2014 2317   HGBUR NEGATIVE 01/25/2015 1551   HGBUR negative 06/06/2009 1442   BILIRUBINUR NEGATIVE 01/25/2015 1551   KETONESUR NEGATIVE 01/25/2015 1551   PROTEINUR NEGATIVE 07/29/2014 2317   UROBILINOGEN 0.2 01/25/2015 1551   NITRITE NEGATIVE 01/25/2015 1551   LEUKOCYTESUR SMALL* 01/25/2015 Chauncey M.D. [facility] 02/01/2016, 11:26 AM  Pager: [phone] Between 7am to 7pm - call Pager - [phone]  After 7pm go to [email or web address] - password [email or web address]  Call night coverage person covering after 7pm

## 2016-02-02 ENCOUNTER — Encounter (HOSPITAL_COMMUNITY)
Admission: AD | Disposition: A | Discharge status: Home / Self Care | Payer: Self-pay | Source: Other Acute Inpatient Hospital | Attending: Internal Medicine

## 2016-02-02 ENCOUNTER — Encounter (HOSPITAL_COMMUNITY): Payer: Self-pay | Admitting: Cardiology

## 2016-02-02 DIAGNOSIS — Z86718 Personal history of other venous thrombosis and embolism: Secondary | ICD-10-CM | POA: Diagnosis not present

## 2016-02-02 DIAGNOSIS — R9439 Abnormal result of other cardiovascular function study: Secondary | ICD-10-CM | POA: Insufficient documentation

## 2016-02-02 DIAGNOSIS — J8410 Pulmonary fibrosis, unspecified: Secondary | ICD-10-CM | POA: Diagnosis not present

## 2016-02-02 DIAGNOSIS — Z794 Long term (current) use of insulin: Secondary | ICD-10-CM | POA: Diagnosis not present

## 2016-02-02 DIAGNOSIS — R0789 Other chest pain: Secondary | ICD-10-CM | POA: Diagnosis not present

## 2016-02-02 DIAGNOSIS — E7800 Pure hypercholesterolemia, unspecified: Secondary | ICD-10-CM | POA: Diagnosis not present

## 2016-02-02 DIAGNOSIS — J189 Pneumonia, unspecified organism: Secondary | ICD-10-CM | POA: Diagnosis not present

## 2016-02-02 DIAGNOSIS — R931 Abnormal findings on diagnostic imaging of heart and coronary circulation: Secondary | ICD-10-CM

## 2016-02-02 DIAGNOSIS — E1142 Type 2 diabetes mellitus with diabetic polyneuropathy: Secondary | ICD-10-CM | POA: Diagnosis not present

## 2016-02-02 DIAGNOSIS — F411 Generalized anxiety disorder: Secondary | ICD-10-CM | POA: Diagnosis not present

## 2016-02-02 DIAGNOSIS — Z8500 Personal history of malignant neoplasm of unspecified digestive organ: Secondary | ICD-10-CM | POA: Diagnosis not present

## 2016-02-02 DIAGNOSIS — I10 Essential (primary) hypertension: Secondary | ICD-10-CM | POA: Diagnosis not present

## 2016-02-02 DIAGNOSIS — Z7901 Long term (current) use of anticoagulants: Secondary | ICD-10-CM | POA: Diagnosis not present

## 2016-02-02 DIAGNOSIS — R079 Chest pain, unspecified: Secondary | ICD-10-CM | POA: Diagnosis not present

## 2016-02-02 DIAGNOSIS — D649 Anemia, unspecified: Secondary | ICD-10-CM | POA: Diagnosis not present

## 2016-02-02 DIAGNOSIS — C169 Malignant neoplasm of stomach, unspecified: Secondary | ICD-10-CM | POA: Diagnosis not present

## 2016-02-02 HISTORY — PX: CARDIAC CATHETERIZATION: SHX172

## 2016-02-02 LAB — PROTIME-INR
INR: 1.22 (ref 0.00–1.49)
PROTHROMBIN TIME: 15.6 s — AB (ref 11.6–15.2)

## 2016-02-02 LAB — GLUCOSE, CAPILLARY
Glucose-Capillary: 150 mg/dL — ABNORMAL HIGH (ref 65–99)
Glucose-Capillary: 118 mg/dL — ABNORMAL HIGH (ref 65–99)
Glucose-Capillary: 145 mg/dL — ABNORMAL HIGH (ref 65–99)
Glucose-Capillary: 253 mg/dL — ABNORMAL HIGH (ref 65–99)

## 2016-02-02 LAB — BASIC METABOLIC PANEL
ANION GAP: 4 — AB (ref 5–15)
BUN: 18 mg/dL (ref 6–20)
CALCIUM: 9.9 mg/dL (ref 8.9–10.3)
CO2: 29 mmol/L (ref 22–32)
Chloride: 107 mmol/L (ref 101–111)
Creatinine, Ser: 0.84 mg/dL (ref 0.44–1.00)
GLUCOSE: 145 mg/dL — AB (ref 65–99)
Potassium: 4 mmol/L (ref 3.5–5.1)
SODIUM: 140 mmol/L (ref 135–145)

## 2016-02-02 SURGERY — LEFT HEART CATH AND CORONARY ANGIOGRAPHY
Anesthesia: LOCAL

## 2016-02-02 MED ORDER — LIDOCAINE HCL (PF) 1 % IJ SOLN
INTRAMUSCULAR | Status: DC | PRN
  Administered 2016-02-02: 2 mL via SUBCUTANEOUS

## 2016-02-02 MED ORDER — FENTANYL CITRATE (PF) 100 MCG/2ML IJ SOLN
INTRAMUSCULAR | Status: AC
  Filled 2016-02-02: qty 2

## 2016-02-02 MED ORDER — LIDOCAINE HCL (PF) 1 % IJ SOLN
INTRAMUSCULAR | Status: AC
  Filled 2016-02-02: qty 30

## 2016-02-02 MED ORDER — HEPARIN SODIUM (PORCINE) 1000 UNIT/ML IJ SOLN
INTRAMUSCULAR | Status: AC
  Filled 2016-02-02: qty 1

## 2016-02-02 MED ORDER — IOPAMIDOL (ISOVUE-370) INJECTION 76%
INTRAVENOUS | Status: AC
  Filled 2016-02-02: qty 100

## 2016-02-02 MED ORDER — MIDAZOLAM HCL 2 MG/2ML IJ SOLN
INTRAMUSCULAR | Status: AC
  Filled 2016-02-02: qty 2

## 2016-02-02 MED ORDER — SODIUM CHLORIDE 0.9% FLUSH
3.0000 mL | Freq: Two times a day (BID) | INTRAVENOUS | Status: DC
  Administered 2016-02-02: 3 mL via INTRAVENOUS

## 2016-02-02 MED ORDER — VERAPAMIL HCL 2.5 MG/ML IV SOLN
INTRAVENOUS | Status: AC
  Filled 2016-02-02: qty 2

## 2016-02-02 MED ORDER — MIDAZOLAM HCL 2 MG/2ML IJ SOLN
INTRAMUSCULAR | Status: DC | PRN
  Administered 2016-02-02: 1 mg via INTRAVENOUS

## 2016-02-02 MED ORDER — SODIUM CHLORIDE 0.9% FLUSH: 3.0000 mL | INTRAVENOUS | Status: DC | PRN

## 2016-02-02 MED ORDER — SODIUM CHLORIDE 0.9 % IV SOLN: 250.0000 mL | INTRAVENOUS | Status: DC | PRN

## 2016-02-02 MED ORDER — SODIUM CHLORIDE 0.9 % WEIGHT BASED INFUSION: 3.0000 mL/kg/h | INTRAVENOUS | Status: AC

## 2016-02-02 MED ORDER — HEPARIN (PORCINE) IN NACL 2-0.9 UNIT/ML-% IJ SOLN
INTRAMUSCULAR | Status: DC | PRN
  Administered 2016-02-02: 1000 mL

## 2016-02-02 MED ORDER — IOPAMIDOL (ISOVUE-370) INJECTION 76%
INTRAVENOUS | Status: DC | PRN
  Administered 2016-02-02: 40 mL via INTRA_ARTERIAL

## 2016-02-02 MED ORDER — FENTANYL CITRATE (PF) 100 MCG/2ML IJ SOLN
INTRAMUSCULAR | Status: DC | PRN
  Administered 2016-02-02: 25 ug via INTRAVENOUS

## 2016-02-02 MED ORDER — HEPARIN SODIUM (PORCINE) 1000 UNIT/ML IJ SOLN
INTRAMUSCULAR | Status: DC | PRN
  Administered 2016-02-02: 3000 [IU] via INTRAVENOUS

## 2016-02-02 MED ORDER — ENOXAPARIN SODIUM 40 MG/0.4ML ~~LOC~~ SOLN: 40.0000 mg | SUBCUTANEOUS | Status: DC

## 2016-02-02 MED ORDER — HEPARIN (PORCINE) IN NACL 2-0.9 UNIT/ML-% IJ SOLN
INTRAMUSCULAR | Status: AC
  Filled 2016-02-02: qty 1000

## 2016-02-02 MED ORDER — VERAPAMIL HCL 2.5 MG/ML IV SOLN
INTRAVENOUS | Status: DC | PRN
  Administered 2016-02-02: 10 mL via INTRA_ARTERIAL

## 2016-02-02 SURGICAL SUPPLY — 12 items

## 2016-02-02 NOTE — H&P (View-Only) (Signed)
  1-day Nuclear Stress Test performed, images and final report pending. Being read by [facility]iology.  Signed, Erma Heritage, PA-C 02/01/2016, 12:54 PM Pager: [phone]  Stress Test Results as follows:  There was no ST segment deviation noted during stress.  T wave inversion of 1 mm was noted during stress in the aVR and V1 leads, beginning at 0 minutes of stress. T wave inversion persisted.  Defect 1: There is a medium defect of mild severity present in the basal inferolateral and mid inferolateral location.  Findings consistent with ischemia.  The left ventricular ejection fraction is hyperdynamic (>65%).  Nuclear stress EF: 86%.  This is an intermediate risk study.  Results reviewed with the patient by Dr. Harrington Challenger. Cardiac catheterization recommended and the pateint agrees to proceed. Risks and benefits addressed by Dr. Harrington Challenger. Added to the cath board for 02/02/2016 with Dr. Irish Lack.  Signed, Erma Heritage, PA-C 02/01/2016, 5:28 PM Pager: [phone]

## 2016-02-02 NOTE — Progress Notes (Signed)
[facility]                                                                              Patient Demographics  Tammy Hughes, is a 82 y.o. female, DOB - [birth year], [id number]  Admit date - 01/31/2016   Admitting Physician Vianne Bulls, MD  Outpatient Primary MD for the patient is Renato Shin, MD  Outpatient specialists:   LOS -   days    chief complaint : Chest pain      Brief summary   Tammy Hughes is a 82 y.o. female with gastric cancer status post resection and radiation now in remission, hypertension, hyperlipidemia, and insulin-dependent diabetes mellitus who presents in transfer from [facility] in [place], [place] for evaluation and management of chest discomfort with elevated troponin. Patient had reportedly been in her usual state of health until she noted the insidious development of a mild frontal headache on 01/29/2016. Headache persisted, worsening slightly, and prompted her to seek evaluation in ED in [place]. While there, she noted that she had experienced a brief episode of left sided chest discomfort earlier in the day and cardiac workup was performed. Chest discomfort was described as a "tightness" across the anterior left chest while she was getting out of the shower the morning of 01/31/2016. Symptoms lasted several minutes before spontaneously resolving. She had not experienced similar symptoms previously and underwent a negative stress test 1 year ago. She has been taking Plavix daily, as well as Crestor and Lopressor. She denied any dyspnea or diaphoresis, but had some nausea and nonbloody nonbilious vomiting associated with the headache. Patient reported that the headache and associated nausea has resolved following treatment at the outside hospital.  ED Course: Upon arrival to the outside hospital ED in [place], patient was found to be afebrile, saturating well on room air, bradycardic in the 50s, but with vitals otherwise stable. She underwent  a noncontrast head CT which was negative for acute intracranial abnormality and chest x-ray which was notable for bilateral opacities suggestive of mild edema versus atypical pneumonia. Chemistry panel was largely unremarkable and CBC was notable for a stable normocytic anemia. Initial troponin was 0.08 and BNP was 50. Patient was treated with 324 mg aspirin chew, which she tolerated without incident despite the reported allergy. 6 hours later, a second troponin returned elevated further to 0.12. D-dimer was also obtained at the outside hospital and returned elevated, prompting CT angiogram PE study. CTA was negative for PE or aortic dissection. Given the elevated and increasing troponin, and lack of cardiology services at the outside hospital, patient was transferred to [facility].   Assessment & Plan   Chest pain/NSTEMI: Currently resolved - Troponins +0.1, no ischemic changes on initial EKG - CXR with non-specific b/l opacities, mild edema vs atypical PNA - Cont ASA, Lopressor, Crestor, and Plavix - Cardiology consulted, awaiting further decision regarding ischemia workup - Stress test : intermediate study with medium defect of mild severity in the basal inferolateral and mid inferior lateral location, consistent with ischemia.  - Plan for cardiac cath today    Headache with nausea, resolved  - Head CT obtained  at outside hospital with no infarct or bleed - No focal neurologic deficits appreciated   Hypertension  - BP minimally elevated at time of admission  - Continue current management with Lopressor    Type II DM  - A1c 8.1% in May 2017, reflecting sub-optimal glycemic-control at that time  - Managed at home with Novolog 70/30, 35 units qAM and 15 units qPM; hold this while in hospital  - Start Lantus 25 units qHS, mealtime coverage 4 units, and moderate-intensity SSI   Normocytic anemia  - Hgb 11.9 on admission, stable relative to priors  - No sign of active blood-loss     Gastric cancer  - In remission  - Status-post resection and radiation, Feeding tube pulled and tolerated PO without incident    Code Status: Full code DVT Prophylaxis:  Lovenox  Family Communication: Discussed in detail with the patient, all imaging results, lab results explained to the patient  Disposition Plan:   Time Spent in minutes  15 minutes  Procedures:    Consultants:   Cardiology  Antimicrobials:      Medications  Scheduled Meds: . clopidogrel  75 mg Oral Daily  . enoxaparin (LOVENOX) injection  30 mg Subcutaneous Q24H  . insulin aspart  0-15 Units Subcutaneous TID WC  . insulin aspart  4 Units Subcutaneous TID WC  . insulin glargine  25 Units Subcutaneous QHS  . metoprolol tartrate  12.5 mg Oral BID  . mirtazapine  15 mg Oral QHS  . pantoprazole  40 mg Oral Daily  . regadenoson  0.4 mg Intravenous Once  . rosuvastatin  10 mg Oral Daily  . sodium chloride flush  3 mL Intravenous Q12H   Continuous Infusions: . sodium chloride 1 mL/kg/hr (02/02/16 0728)   PRN Meds:.sodium chloride, acetaminophen, HYDROcodone-acetaminophen, nitroGLYCERIN, ondansetron (ZOFRAN) IV, sodium chloride flush   Antibiotics   Anti-infectives    None        Subjective:   Tammy Hughes was seen and examined today.  Patient denies dizziness, chest pain, shortness of breath, abdominal pain, N/V/D/C, new weakness, numbess, tingling. No acute events overnight.    Objective:   Filed Vitals:   02/02/16 0400 02/02/16 0557 02/02/16 0619 02/02/16 0839  BP: 116/55   136/72  Pulse: 49   56  Temp: 97.4 F (36.3 C)     TempSrc: Oral     Resp: 18     Height:      Weight:  56 kg (123 lb 7.3 oz) 55.52 kg (122 lb 6.4 oz)   SpO2: 97%       Intake/Output Summary (Last 24 hours) at 02/02/16 1331 Last data filed at 02/02/16 [id number]  Gross per 24 hour  Intake    240 ml  Output    350 ml  Net   -110 ml     Wt Readings from Last 3 Encounters:  02/02/16 55.52 kg (122 lb 6.4 oz)   11/13/15 57.788 kg (127 lb 6.4 oz)  10/31/15 58.832 kg (129 lb 11.2 oz)     Exam  General: Alert and oriented x 3, NAD  HEENT:    Neck: Supple, no JVD  Cardiovascular: S1 S2clear, RRR  Respiratory: Clear to auscultation bilaterally, no wheezing, rales or rhonchi  Gastrointestinal: Soft, nontender, nondistended, + bowel sounds  Ext: no cyanosis clubbing or edema  Neuro: no new deficit  Skin: No rashes  Psych: Normal affect and demeanor, alert and oriented x3    Data Reviewed:  I have personally reviewed following  labs and imaging studies  Micro Results No results found for this or any previous visit (from the past 240 hour(s)).  Radiology Reports Nm Myocar Multi W/spect W/wall Motion / Ef  02/01/2016   There was no ST segment deviation noted during stress.  T wave inversion of 1 mm was noted during stress in the aVR and V1 leads, beginning at 0 minutes of stress. T wave inversion persisted.  Defect 1: There is a medium defect of mild severity present in the basal inferolateral and mid inferolateral location.  Findings consistent with ischemia.  The left ventricular ejection fraction is hyperdynamic (>65%).  Nuclear stress EF: 86%.  This is an intermediate risk study.     Lab Data:  CBC:  Recent Labs Lab 02/01/16 0634  WBC 3.8*  HGB 11.5*  HCT 36.1  MCV 89.4  PLT A999333   Basic Metabolic Panel:  Recent Labs Lab 02/01/16 0634 02/02/16 0510  NA 141 140  K 3.9 4.0  CL 108 107  CO2 29 29  GLUCOSE 161* 145*  BUN 15 18  CREATININE 0.76 0.84  CALCIUM 9.9 9.9   GFR: Estimated Creatinine Clearance: 41.7 mL/min (by C-G formula based on Cr of 0.84). Liver Function Tests: No results for input(s): AST, ALT, ALKPHOS, BILITOT, PROT, ALBUMIN in the last 168 hours. No results for input(s): LIPASE, AMYLASE in the last 168 hours. No results for input(s): AMMONIA in the last 168 hours. Coagulation Profile:  Recent Labs Lab 02/02/16 0510  INR 1.22    Cardiac Enzymes:  Recent Labs Lab 02/01/16 0308 02/01/16 0634 02/01/16 0923 02/01/16 1537 02/01/16 2107  TROPONINI 0.11* 0.10* 0.11* 0.10* 0.11*   BNP (last 3 results) No results for input(s): PROBNP in the last 8760 hours. HbA1C: No results for input(s): HGBA1C in the last 72 hours. CBG:  Recent Labs Lab 02/01/16 1628 02/01/16 1758 02/01/16 2107 02/02/16 0726 02/02/16 1118  GLUCAP 348* 300* 171* 150* 118*   Lipid Profile:  Recent Labs  02/01/16 0634  CHOL 103  HDL 33*  LDLCALC 41  TRIG 145  CHOLHDL 3.1   Thyroid Function Tests:  Recent Labs  02/01/16 0308  TSH 2.034   Anemia Panel: No results for input(s): VITAMINB12, FOLATE, FERRITIN, TIBC, IRON, RETICCTPCT in the last 72 hours. Urine analysis:    Component Value Date/Time   COLORURINE YELLOW 01/25/2015 1551   APPEARANCEUR Sl Cloudy* 01/25/2015 1551   LABSPEC 1.020 01/25/2015 1551   PHURINE 5.5 01/25/2015 1551   GLUCOSEU NEGATIVE 01/25/2015 1551   GLUCOSEU 250* 07/29/2014 2317   HGBUR NEGATIVE 01/25/2015 1551   HGBUR negative 06/06/2009 1442   BILIRUBINUR NEGATIVE 01/25/2015 1551   KETONESUR NEGATIVE 01/25/2015 1551   PROTEINUR NEGATIVE 07/29/2014 2317   UROBILINOGEN 0.2 01/25/2015 1551   NITRITE NEGATIVE 01/25/2015 1551   LEUKOCYTESUR SMALL* 01/25/2015 Ol[place]o M.D. [facility] 02/02/2016, 1:31 PM  Pager: [phone] Between 7am to 7pm - call Pager - [phone]  After 7pm go to [email or web address] - password [email or web address]  Call night coverage person covering after 7pm

## 2016-02-02 NOTE — Interval H&P Note (Signed)
History and Physical Interval Note:  02/02/2016 2:56 PM  Tammy Hughes  has presented today for surgery, with the diagnosis of abnormal myoview  The various methods of treatment have been discussed with the patient and family. After consideration of risks, benefits and other options for treatment, the patient has consented to  Procedure(s): Left Heart Cath and Coronary Angiography (N/A) as a surgical intervention .  The patient's history has been reviewed, patient examined, no change in status, stable for surgery.  I have reviewed the patient's chart and labs.  Questions were answered to the patient's satisfaction.   Cath Lab Visit (complete for each Cath Lab visit)  Clinical Evaluation Leading to the Procedure:   ACS: Yes.    Non-ACS:    Anginal Classification: CCS II  Anti-ischemic medical therapy: Minimal Therapy (1 class of medications)  Non-Invasive Test Results: Intermediate-risk stress test findings: cardiac mortality 1-3%/year  Prior CABG: No previous CABG        Collier Salina [facility] 02/02/2016 2:57 PM

## 2016-02-02 NOTE — Progress Notes (Signed)
PLan for cath today  F/U based on results

## 2016-02-02 NOTE — Progress Notes (Signed)
Inpatient Diabetes Program Recommendations  AACE/ADA: New Consensus Statement on Inpatient Glycemic Control (2015)  Target Ranges:  Prepandial:   less than 140 mg/dL      Peak postprandial:   less than 180 mg/dL (1-2 hours)      Critically ill patients:  140 - 180 mg/dL   Lab Results  Component Value Date   GLUCAP 118* 02/02/2016   HGBA1C 8.1 11/13/2015   Results for [name], [name] (MRN [id number]) as of 02/02/2016 13:45  Ref. Range 02/01/2016 16:28 02/01/2016 17:58 02/01/2016 21:07 02/02/2016 07:26 02/02/2016 11:18  Glucose-Capillary Latest Ref Range: 65-99 mg/dL 348 (H) 300 (H) 171 (H) 150 (H) 118 (H)   Review of Glycemic Control  Diabetes history: DM2 Outpatient Diabetes medications: 70/30 35 units in am and 15 units in pm Current orders for Inpatient glycemic control: Lantus 25 units QHS, Novolog moderate tidwc + 4 units tidwc  CL diet at present. Awaiting cath.  Inpatient Diabetes Program Recommendations:    Will likely need more meal coverage insulin when eating. Consider increasing Novolog to 5 units tidwc if pt eats > 50% meal.  Will follow. Thank you. Tammy Hughes, RD, LDN, CDE Inpatient Diabetes Coordinator [phone]

## 2016-02-03 DIAGNOSIS — E7800 Pure hypercholesterolemia, unspecified: Secondary | ICD-10-CM | POA: Diagnosis not present

## 2016-02-03 DIAGNOSIS — R0789 Other chest pain: Secondary | ICD-10-CM | POA: Diagnosis not present

## 2016-02-03 DIAGNOSIS — R001 Bradycardia, unspecified: Secondary | ICD-10-CM

## 2016-02-03 DIAGNOSIS — C169 Malignant neoplasm of stomach, unspecified: Secondary | ICD-10-CM | POA: Diagnosis not present

## 2016-02-03 DIAGNOSIS — R9439 Abnormal result of other cardiovascular function study: Secondary | ICD-10-CM | POA: Diagnosis not present

## 2016-02-03 DIAGNOSIS — R931 Abnormal findings on diagnostic imaging of heart and coronary circulation: Secondary | ICD-10-CM | POA: Diagnosis not present

## 2016-02-03 DIAGNOSIS — I10 Essential (primary) hypertension: Secondary | ICD-10-CM | POA: Diagnosis not present

## 2016-02-03 DIAGNOSIS — F411 Generalized anxiety disorder: Secondary | ICD-10-CM | POA: Diagnosis not present

## 2016-02-03 DIAGNOSIS — D649 Anemia, unspecified: Secondary | ICD-10-CM | POA: Diagnosis not present

## 2016-02-03 DIAGNOSIS — R079 Chest pain, unspecified: Secondary | ICD-10-CM | POA: Diagnosis not present

## 2016-02-03 LAB — GLUCOSE, CAPILLARY
Glucose-Capillary: 162 mg/dL — ABNORMAL HIGH (ref 65–99)
Glucose-Capillary: 210 mg/dL — ABNORMAL HIGH (ref 65–99)

## 2016-02-03 MED ORDER — PANTOPRAZOLE SODIUM 40 MG PO TBEC: 40.0000 mg | DELAYED_RELEASE_TABLET | Freq: Every day | ORAL | Status: DC

## 2016-02-03 MED ORDER — NITROGLYCERIN 0.4 MG SL SUBL: 0.4000 mg | SUBLINGUAL_TABLET | SUBLINGUAL | Status: DC | PRN

## 2016-02-03 NOTE — Progress Notes (Signed)
PT Cancellation Note  Patient Details Name: Tammy Hughes MRN: [id number] DOB: [birth year]   Cancelled Treatment:    Reason Eval/Treat Not Completed: PT screened, no needs identified, will sign off;Patient declined, no reason specified Pt standing at bedside with daughter assisting in getting pt dressed to be discharged. Reports not needing any services as she is going home today. Will sign off.    Marguarite Arbour A Hager [place] 02/03/2016, 9:28 AM  Wray Kearns, [place], DPT [phone]

## 2016-02-03 NOTE — Progress Notes (Signed)
   Subjective: NO CP  No SOB   Objective: Filed Vitals:   02/02/16 2100 02/02/16 2200 02/03/16 0500 02/03/16 0732  BP: 139/62 136/61 111/47 124/53  Pulse:   55 54  Temp:   98.2 F (36.8 C) 98.3 F (36.8 C)  TempSrc:   Oral Oral  Resp: 14 20 20 16   Height:      Weight:   129 lb 11.2 oz (58.832 kg)   SpO2:   95% 94%   Weight change: 6 lb 3.9 oz (2.832 kg)  Intake/Output Summary (Last 24 hours) at 02/03/16 0736 Last data filed at 02/02/16 2300  Gross per 24 hour  Intake      0 ml  Output    600 ml  Net   -600 ml    General: Alert, awake, oriented x3, in no acute distress Neck:  JVP is normal Heart: Regular rate and rhythm, without murmurs, rubs, gallops.  Lungs: Clear to auscultation.  No rales or wheezes. Exemities:  No edema.   Neuro: Grossly intact, nonfocal.  TEle:  SB/ SR Lab Results: Results for orders placed or performed during the hospital encounter of 01/31/16 (from the past 24 hour(s))  Glucose, capillary     Status: Abnormal   Collection Time: 02/02/16 11:18 AM  Result Value Ref Range   Glucose-Capillary 118 (H) 65 - 99 mg/dL  Glucose, capillary     Status: Abnormal   Collection Time: 02/02/16  4:23 PM  Result Value Ref Range   Glucose-Capillary 145 (H) 65 - 99 mg/dL  Glucose, capillary     Status: Abnormal   Collection Time: 02/02/16  7:55 PM  Result Value Ref Range   Glucose-Capillary 253 (H) 65 - 99 mg/dL  Glucose, capillary     Status: Abnormal   Collection Time: 02/03/16 12:10 AM  Result Value Ref Range   Glucose-Capillary 210 (H) 65 - 99 mg/dL    Studies/Results: No results found.  Medications: REviewed     @PROBHOSP @  1 Cardiac  Abnomal troponin  Abnormal myoview Cath with minimal nonobstructive CAD  NOrmal LV function  2  Hx NSVT on hosp in 2016  Reasonable to keep on low dose loprpessor  3  HTN  Follow as outpt  4  ? TIA in past  Again, during hosp with surgery  Has been on Plavix  Will defer to C Hilty  / S Rada Hay 02/03/2016, 7:36 AM

## 2016-02-03 NOTE — Discharge Summary (Signed)
Physician Discharge Summary   Patient ID: TYRESHIA FERGERSON MRN: [id number] DOB/AGE: February 26, 1933 82 y.o.  Admit date: 01/31/2016 Discharge date: 02/03/2016  Primary Care Physician:  Renato Shin, MD  Discharge Diagnoses:    . Chest pain . Type 2 diabetes mellitus with peripheral neuropathy (HCC) . Gastric cancer-s/p gastrectomy 07/20/14 . Essential hypertension . Normocytic anemia . Sinus bradycardia With prior history of NSVT  Consults:  Cardiology, Dr. Harrington Challenger  Recommendations for Outpatient Follow-up:  1. Please repeat CBC/BMET at next visit   DIET: Heart healthy diet    Allergies:   Allergies  Allergen Reactions  . Pirfenidone Diarrhea and Nausea And Vomiting  . Aspirin     rash     DISCHARGE MEDICATIONS: Discharge Medication List as of 02/03/2016  9:50 AM    START taking these medications   Details  pantoprazole (PROTONIX) 40 MG tablet Take 1 tablet (40 mg total) by mouth daily., Starting 02/03/2016, Until Discontinued, Print      CONTINUE these medications which have CHANGED   Details  nitroGLYCERIN (NITROSTAT) 0.4 MG SL tablet Place 1 tablet (0.4 mg total) under the tongue every 5 (five) minutes x 3 doses as needed for chest pain., Starting 02/03/2016, Until Discontinued, Print      CONTINUE these medications which have NOT CHANGED   Details  clopidogrel (PLAVIX) 75 MG tablet Take 1 tablet (75 mg total) by mouth daily., Starting 08/24/2015, Until Discontinued, Normal    insulin aspart protamine - aspart (NOVOLOG MIX 70/30 FLEXPEN) (70-30) 100 UNIT/ML FlexPen 35 units with breakfast, and 15 units with supper, and pen needles 2/day, Normal    megestrol (MEGACE ES) 625 MG/5ML suspension TAKE 1 TEASPOONFUL BY MOUTH DAILY AS DIRECTED, Normal    metoprolol tartrate (LOPRESSOR) 25 MG tablet TAKE 1 TABLET TWICE A DAY, Normal    mirtazapine (REMERON) 15 MG tablet TAKE 1 TABLET BY MOUTH EACH NIGHT AT BEDTIME IF NEEDED, Normal    rosuvastatin (CRESTOR) 10 MG tablet Take 1  tablet (10 mg total) by mouth daily., Starting 08/24/2015, Until Discontinued, Normal    traMADol (ULTRAM) 50 MG tablet Take 1 tablet (50 mg total) by mouth every 6 (six) hours as needed., Starting 01/05/2015, Until Discontinued, Print      STOP taking these medications     Dexlansoprazole 30 MG capsule      esomeprazole (NEXIUM) 40 MG capsule      HYDROcodone-acetaminophen (HYCET) 7.5-325 mg/15 ml solution          Brief H and P: For complete details please refer to admission H and P, but in brief Tammy Hughes is a 82 y.o. female with gastric cancer status post resection and radiation now in remission, hypertension, hyperlipidemia, and insulin-dependent diabetes mellitus who presents in transfer from [facility] in [place], [place] for evaluation and management of chest discomfort with elevated troponin. Patient had reportedly been in her usual state of health until she noted the insidious development of a mild frontal headache on 01/29/2016. Headache persisted, worsening slightly, and prompted her to seek evaluation in ED in [place]. While there, she noted that she had experienced a brief episode of left sided chest discomfort earlier in the day and cardiac workup was performed. Chest discomfort was described as a "tightness" across the anterior left chest while she was getting out of the shower the morning of 01/31/2016. Symptoms lasted several minutes before spontaneously resolving. She had not experienced similar symptoms previously and underwent a negative stress test 1 year ago. She has  been taking Plavix daily, as well as Crestor and Lopressor. She denied any dyspnea or diaphoresis, but had some nausea and nonbloody nonbilious vomiting associated with the headache. Patient reported that the headache and associated nausea has resolved following treatment at the outside hospital.  ED Course: Upon arrival to the outside hospital ED in [place], patient was found to be afebrile, saturating well  on room air, bradycardic in the 50s, but with vitals otherwise stable. She underwent a noncontrast head CT which was negative for acute intracranial abnormality and chest x-ray which was notable for bilateral opacities suggestive of mild edema versus atypical pneumonia. Chemistry panel was largely unremarkable and CBC was notable for a stable normocytic anemia. Initial troponin was 0.08 and BNP was 50. Patient was treated with 324 mg aspirin chew, which she tolerated without incident despite the reported allergy. 6 hours later, a second troponin returned elevated further to 0.12. D-dimer was also obtained at the outside hospital and returned elevated, prompting CT angiogram PE study. CTA was negative for PE or aortic dissection. Given the elevated and increasing troponin, and lack of cardiology services at the outside hospital, patient was transferred to [facility].  Hospital Course:   Chest pain/NSTEMI: Currently resolved - Troponins +0.1, no ischemic changes on initial EKG - CXR showed non-specific b/l opacities, mild edema vs atypical PNA, no fevers or leukocytosis or URI symptoms except chest pain - Cont ASA, Lopressor, Crestor, and Plavix - Cardiology was consulted, patient underwent nuclear medicine stress test - Stress test : intermediate study with medium defect of mild severity in the basal inferolateral and mid inferior lateral location, consistent with ischemia.  -She underwent cardiac cath which showed minimal nonobstructive coronary artery disease with normal LV function. -Patient was cleared from cardiology to discharge home.   Headache with nausea, resolved  - Head CT obtained at outside hospital with no infarct or bleed - No focal neurologic deficits appreciated   Hypertension  - BP minimally elevated at time of admission  - Continue current management with Lopressor   Type II DM  - A1c 8.1% in May 2017, reflecting sub-optimal glycemic-control at that time  - Managed  at home with Novolog 70/30, 35 units qAM and 15 units qPM; hold this while in hospital  - Start Lantus 25 units qHS, mealtime coverage 4 units, and moderate-intensity SSI   Normocytic anemia  - Hgb 11.9 on admission, stable relative to priors  - No sign of active blood-loss   Gastric cancer  - In remission  - Status-post resection and radiation, Feeding tube pulled and tolerated PO without incident. Change to Protonix as patient is on Plavix.   History of NSVT - Continue beta blocker  Day of Discharge BP 124/53 mmHg  Pulse 54  Temp(Src) 98.3 F (36.8 C) (Oral)  Resp 16  Ht 5' (1.524 m)  Wt 58.832 kg (129 lb 11.2 oz)  BMI 25.33 kg/m2  SpO2 94%  Physical Exam: General: Alert and awake oriented x3 not in any acute distress. HEENT: anicteric sclera, pupils reactive to light and accommodation CVS: S1-S2 clear no murmur rubs or gallops Chest: clear to auscultation bilaterally, no wheezing rales or rhonchi Abdomen: soft nontender, nondistended, normal bowel sounds Extremities: no cyanosis, clubbing or edema noted bilaterally Neuro: Cranial nerves II-XII intact, no focal neurological deficits   The results of significant diagnostics from this hospitalization (including imaging, microbiology, ancillary and laboratory) are listed below for reference.    LAB RESULTS: Basic Metabolic Panel:  Recent Labs Lab  02/01/16 0634 02/02/16 0510  NA 141 140  K 3.9 4.0  CL 108 107  CO2 29 29  GLUCOSE 161* 145*  BUN 15 18  CREATININE 0.76 0.84  CALCIUM 9.9 9.9   Liver Function Tests: No results for input(s): AST, ALT, ALKPHOS, BILITOT, PROT, ALBUMIN in the last 168 hours. No results for input(s): LIPASE, AMYLASE in the last 168 hours. No results for input(s): AMMONIA in the last 168 hours. CBC:  Recent Labs Lab 02/01/16 0634  WBC 3.8*  HGB 11.5*  HCT 36.1  MCV 89.4  PLT 226   Cardiac Enzymes:  Recent Labs Lab 02/01/16 1537 02/01/16 2107  TROPONINI 0.10* 0.11*    BNP: Invalid input(s): POCBNP CBG:  Recent Labs Lab 02/03/16 0010 02/03/16 0727  GLUCAP 210* 162*    Significant Diagnostic Studies:  Nm Myocar Multi W/spect W/wall Motion / Ef  02/01/2016   There was no ST segment deviation noted during stress.  T wave inversion of 1 mm was noted during stress in the aVR and V1 leads, beginning at 0 minutes of stress. T wave inversion persisted.  Defect 1: There is a medium defect of mild severity present in the basal inferolateral and mid inferolateral location.  Findings consistent with ischemia.  The left ventricular ejection fraction is hyperdynamic (>65%).  Nuclear stress EF: 86%.  This is an intermediate risk study.      Procedures    Left Heart Cath and Coronary Angiography    Conclusion     Ost LAD to Mid LAD lesion, 15% stenosed.  The left ventricular systolic function is normal.  1. Minimal nonobstructive CAD 2. Normal LV function  Plan: medical management.      Disposition and Follow-up: Discharge Instructions    Diet Carb Modified    Complete by:  As directed      Increase activity slowly    Complete by:  As directed             DISPOSITION:Home   DISCHARGE FOLLOW-UP Follow-up Information    Follow up with Renato Shin, MD. Schedule an appointment as soon as possible for a visit in 2 weeks.   Specialty:  Endocrinology   Why:  for hospital follow-up   Contact information:   [address] [place] [address] [phone]       Follow up with Pixie Casino, MD. Schedule an appointment as soon as possible for a visit in 2 weeks.   Specialty:  Cardiology   Why:  for hospital follow-up   Contact information:   [place]  [address] [phone]        Time spent on Discharge: 28mins   Signed:   Trinda Harlacher M.D. [facility] 02/03/2016, 11:13 AM Pager: [phone]

## 2016-02-08 ENCOUNTER — Ambulatory Visit
Admission: RE | Admit: 2016-02-08 | Discharge: 2016-02-08 | Disposition: A | Discharge status: Home / Self Care | Payer: Medicare Other | Source: Ambulatory Visit | Attending: Endocrinology | Admitting: Endocrinology

## 2016-02-08 ENCOUNTER — Encounter: Payer: Self-pay | Admitting: Endocrinology

## 2016-02-08 ENCOUNTER — Ambulatory Visit (INDEPENDENT_AMBULATORY_CARE_PROVIDER_SITE_OTHER): Payer: Medicare Other | Admitting: Endocrinology

## 2016-02-08 VITALS — BP 120/70 | HR 54 | Wt 123.0 lb

## 2016-02-08 DIAGNOSIS — Z794 Long term (current) use of insulin: Secondary | ICD-10-CM

## 2016-02-08 DIAGNOSIS — E118 Type 2 diabetes mellitus with unspecified complications: Secondary | ICD-10-CM | POA: Diagnosis not present

## 2016-02-08 DIAGNOSIS — M25512 Pain in left shoulder: Secondary | ICD-10-CM | POA: Diagnosis not present

## 2016-02-08 DIAGNOSIS — M19012 Primary osteoarthritis, left shoulder: Secondary | ICD-10-CM | POA: Diagnosis not present

## 2016-02-08 DIAGNOSIS — M25519 Pain in unspecified shoulder: Secondary | ICD-10-CM | POA: Insufficient documentation

## 2016-02-08 LAB — POCT GLYCOSYLATED HEMOGLOBIN (HGB A1C): HEMOGLOBIN A1C: 7.5

## 2016-02-08 MED ORDER — INSULIN ASPART PROT & ASPART (70-30 MIX) 100 UNIT/ML PEN: PEN_INJECTOR | SUBCUTANEOUS | 11 refills | Status: DC

## 2016-02-08 NOTE — Progress Notes (Signed)
Subjective:    Patient ID: Tammy Hughes, female    DOB: 12-07-1932, 82 y.o.   MRN: [id number]  HPI The state of at least three ongoing medical problems is addressed today, with interval history of each noted here:  Pt returns for f/u of diabetes mellitus:  DM type: Insulin-requiring type 2.  [id number].   Complications: polyneuropathy and CAD Therapy: insulin since 2005.  GDM: never.  DKA: never. Severe hypoglycemia: never.   Pancreatitis: never.  Other: she chose bid premixed insulin.   Interval history: She takes 10 units qam and 20 units qpm.  She has mild fasting hypoglycemia. She has 1 week of moderate pain at the left shoulder, but no assoc numbness.  She is unable to cite precip factor, such as local injury. Past Medical History:  Diagnosis Date  . Allergy    SEASONAL  . ANXIETY 03/08/2008  . Cataract    REMOVED BILATERAL  . Depression   . DIABETES MELLITUS, TYPE I 02/23/2007  . DIVERTICULOSIS, COLON 03/08/2008  . DVT (deep venous thrombosis) ([place])   . GERD 02/23/2007  . HYPERCHOLESTEROLEMIA 02/01/2008  . HYPERTENSION 03/08/2008  . OSTEOARTHRITIS 02/23/2007  . OSTEOPOROSIS 02/23/2007  . PERIPHERAL NEUROPATHY 02/23/2007  . Stomach cancer ([place]) 07/20/14   invasive adenocarcinoma w/signet rings features  . Urolithiasis     Past Surgical History:  Procedure Laterality Date  . CARDIAC CATHETERIZATION N/A 02/02/2016   Procedure: Left Heart Cath and Coronary Angiography;  Surgeon: Peter M Martinique, MD;  Location: [place] [facility];  Service: Cardiovascular;  Laterality: N/A;  . CHOLECYSTECTOMY  1995  . ESOPHAGOGASTRODUODENOSCOPY  06/08/2002  . GASTRECTOMY N/A 07/20/2014   Procedure: PROXIMAL GASTRECTOMY;  Surgeon: Stark Klein, MD;  Location: [facility];  Service: General;  Laterality: N/A;  . GASTROJEJUNOSTOMY N/A 07/20/2014   Procedure: GASTROJEJUNOSTOMY;  Surgeon: Stark Klein, MD;  Location: [place];  Service: General;  Laterality: N/A;  . LAPAROSCOPY N/A 07/20/2014   Procedure:  LAPAROSCOPY DIAGNOSTIC;  Surgeon: Stark Klein, MD;  Location: [place];  Service: General;  Laterality: N/A;    Social History   Social History  . Marital status: Married    Spouse name: N/A  . Number of children: N/A  . Years of education: N/A   Occupational History  . Retired Retired   Social History Main Topics  . Smoking status: Never Smoker  . Smokeless tobacco: Never Used  . Alcohol use No  . Drug use: No  . Sexual activity: Not on file   Other Topics Concern  . Not on file   Social History Narrative   Dentist          Current Outpatient Prescriptions on File Prior to Visit  Medication Sig Dispense Refill  . clopidogrel (PLAVIX) 75 MG tablet Take 1 tablet (75 mg total) by mouth daily. 90 tablet 1  . megestrol (MEGACE ES) 625 MG/5ML suspension TAKE 1 TEASPOONFUL BY MOUTH DAILY AS DIRECTED 150 mL 2  . metoprolol tartrate (LOPRESSOR) 25 MG tablet TAKE 1 TABLET TWICE A DAY 180 tablet 1  . mirtazapine (REMERON) 15 MG tablet TAKE 1 TABLET BY MOUTH EACH NIGHT AT BEDTIME IF NEEDED (Patient taking differently: TAKE 1 TABLET BY MOUTH EACH NIGHT AT BEDTIME) 30 tablet 3  . nitroGLYCERIN (NITROSTAT) 0.4 MG SL tablet Place 1 tablet (0.4 mg total) under the tongue every 5 (five) minutes x 3 doses as needed for chest pain. 30 tablet 12  . pantoprazole (PROTONIX) 40 MG tablet Take 1  tablet (40 mg total) by mouth daily. 30 tablet 3  . rosuvastatin (CRESTOR) 10 MG tablet Take 1 tablet (10 mg total) by mouth daily. 90 tablet 3  . traMADol (ULTRAM) 50 MG tablet Take 1 tablet (50 mg total) by mouth every 6 (six) hours as needed. 30 tablet 0   No current facility-administered medications on file prior to visit.     Allergies  Allergen Reactions  . Pirfenidone Diarrhea and Nausea And Vomiting  . Aspirin     rash    Family History  Problem Relation Age of Onset  . Cancer Father     Prostate Cancer  . Cancer Sister     Breast Cancer  . Diabetes Sister   .  Diabetes Sister   . Other Mother     pneumonia   . Cancer Sister     breast cancer     BP 120/70   Pulse (!) 54   Wt 123 lb (55.8 kg)   SpO2 91%   BMI 24.02 kg/m   Review of Systems Denies LOC and weight change    Objective:   Physical Exam VITAL SIGNS:  See vs page GENERAL: no distress Right shoulder: nontender.  Full ROM, but ROM is painful.     A1c=7.4%    Assessment & Plan:  Insulin-requiring type 2 DM: Based on the pattern of her cbg's, she needs some adjustment in her therapy Shoulder pain, new.  She declines ref specialist.    Patient is advised the following: Patient Instructions  Please change the insulin to 20 units with breakfast, and 10 units with supper Please come back for a follow-up appointment in 4 months.  Please continue the same tramadol as needed.  X-rays are requested for you today.  We'll let you know about the results. check your blood sugar twice a day.  vary the time of day when you check, between before the 3 meals, and at bedtime.  also check if you have symptoms of your blood sugar being too high or too low.  please keep a record of the readings and bring it to your next appointment here (or you can bring the meter itself).  You can write it on any piece of paper.  please call us sooner if your blood sugar goes below 70, or if you have a lot of readings over 200.   Renato Shin, MD

## 2016-02-08 NOTE — Patient Instructions (Addendum)
Please change the insulin to 20 units with breakfast, and 10 units with supper Please come back for a follow-up appointment in 4 months.  Please continue the same tramadol as needed.  X-rays are requested for you today.  We'll let you know about the results. check your blood sugar twice a day.  vary the time of day when you check, between before the 3 meals, and at bedtime.  also check if you have symptoms of your blood sugar being too high or too low.  please keep a record of the readings and bring it to your next appointment here (or you can bring the meter itself).  You can write it on any piece of paper.  please call us sooner if your blood sugar goes below 70, or if you have a lot of readings over 200.

## 2016-02-09 ENCOUNTER — Telehealth: Payer: Self-pay | Admitting: Endocrinology

## 2016-02-09 NOTE — Telephone Encounter (Signed)
PT called, stated she was returning the nurse phone call, requests call back.

## 2016-02-13 NOTE — Telephone Encounter (Signed)
Refer to imaging notes.

## 2016-03-04 ENCOUNTER — Other Ambulatory Visit: Payer: Self-pay | Admitting: Endocrinology

## 2016-03-15 ENCOUNTER — Ambulatory Visit (INDEPENDENT_AMBULATORY_CARE_PROVIDER_SITE_OTHER): Payer: Medicare Other | Admitting: Endocrinology

## 2016-03-15 ENCOUNTER — Encounter: Payer: Self-pay | Admitting: Endocrinology

## 2016-03-15 VITALS — BP 122/70 | HR 59 | Wt 125.0 lb

## 2016-03-15 DIAGNOSIS — Z23 Encounter for immunization: Secondary | ICD-10-CM

## 2016-03-15 MED ORDER — INSULIN ASPART PROT & ASPART (70-30 MIX) 100 UNIT/ML PEN: PEN_INJECTOR | SUBCUTANEOUS | 11 refills | Status: DC

## 2016-03-15 NOTE — Patient Instructions (Addendum)
Please increase the insulin to 25 units with breakfast, and 10 units with supper.   Please come back for a follow-up appointment in 3 months.   check your blood sugar twice a day.  vary the time of day when you check, between before the 3 meals, and at bedtime.  also check if you have symptoms of your blood sugar being too high or too low.  please keep a record of the readings and bring it to your next appointment here (or you can bring the meter itself).  You can write it on any piece of paper.  please call us sooner if your blood sugar goes below 70, or if you have a lot of readings over 200.

## 2016-03-15 NOTE — Progress Notes (Signed)
Subjective:    Patient ID: Tammy Hughes, female    DOB: 09-07-1932, 82 y.o.   MRN: [id number]  HPI The state of at least three ongoing medical problems is addressed today, with interval history of each noted here:  Pt returns for f/u of diabetes mellitus:  DM type: Insulin-requiring type 2.  Dx'edCG:[id number].   Complications: polyneuropathy and CAD Therapy: insulin since 2005.  GDM: never.  DKA: never. Severe hypoglycemia: never.   Pancreatitis: never.  Other: she chose bid premixed insulin.   Interval history: She takes 20 units qam and 10 units qpm.  she brings a record of her cbg's which i have reviewed today.  It varies from 127-265.  It is in general higher as the day goes on.   Past Medical History:  Diagnosis Date  . Allergy    SEASONAL  . ANXIETY 03/08/2008  . Cataract    REMOVED BILATERAL  . Depression   . DIABETES MELLITUS, TYPE I 02/23/2007  . DIVERTICULOSIS, COLON 03/08/2008  . DVT (deep venous thrombosis) ([place])   . GERD 02/23/2007  . HYPERCHOLESTEROLEMIA 02/01/2008  . HYPERTENSION 03/08/2008  . OSTEOARTHRITIS 02/23/2007  . OSTEOPOROSIS 02/23/2007  . PERIPHERAL NEUROPATHY 02/23/2007  . Stomach cancer ([place]) 07/20/14   invasive adenocarcinoma w/signet rings features  . Urolithiasis     Past Surgical History:  Procedure Laterality Date  . CARDIAC CATHETERIZATION N/A 02/02/2016   Procedure: Left Heart Cath and Coronary Angiography;  Surgeon: Peter M Martinique, MD;  Location: [place] [facility];  Service: Cardiovascular;  Laterality: N/A;  . CHOLECYSTECTOMY  1995  . ESOPHAGOGASTRODUODENOSCOPY  06/08/2002  . GASTRECTOMY N/A 07/20/2014   Procedure: PROXIMAL GASTRECTOMY;  Surgeon: Stark Klein, MD;  Location: [place];  Service: General;  Laterality: N/A;  . GASTROJEJUNOSTOMY N/A 07/20/2014   Procedure: GASTROJEJUNOSTOMY;  Surgeon: Stark Klein, MD;  Location: [facility];  Service: General;  Laterality: N/A;  . LAPAROSCOPY N/A 07/20/2014   Procedure: LAPAROSCOPY DIAGNOSTIC;  Surgeon: Stark Klein, MD;  Location: [place];  Service: General;  Laterality: N/A;    Social History   Social History  . Marital status: Married    Spouse name: N/A  . Number of children: N/A  . Years of education: N/A   Occupational History  . Retired Retired   Social History Main Topics  . Smoking status: Never Smoker  . Smokeless tobacco: Never Used  . Alcohol use No  . Drug use: No  . Sexual activity: Not on file   Other Topics Concern  . Not on file   Social History Narrative   Dentist          Current Outpatient Prescriptions on File Prior to Visit  Medication Sig Dispense Refill  . clopidogrel (PLAVIX) 75 MG tablet TAKE 1 TABLET DAILY 90 tablet 1  . megestrol (MEGACE ES) 625 MG/5ML suspension TAKE 1 TEASPOONFUL BY MOUTH DAILY AS DIRECTED 150 mL 2  . metoprolol tartrate (LOPRESSOR) 25 MG tablet TAKE 1 TABLET TWICE A DAY 180 tablet 1  . mirtazapine (REMERON) 15 MG tablet TAKE 1 TABLET BY MOUTH EACH NIGHT AT BEDTIME IF NEEDED (Patient taking differently: TAKE 1 TABLET BY MOUTH EACH NIGHT AT BEDTIME) 30 tablet 3  . nitroGLYCERIN (NITROSTAT) 0.4 MG SL tablet Place 1 tablet (0.4 mg total) under the tongue every 5 (five) minutes x 3 doses as needed for chest pain. 30 tablet 12  . pantoprazole (PROTONIX) 40 MG tablet Take 1 tablet (40 mg total) by mouth daily.  30 tablet 3  . rosuvastatin (CRESTOR) 10 MG tablet Take 1 tablet (10 mg total) by mouth daily. 90 tablet 3  . traMADol (ULTRAM) 50 MG tablet Take 1 tablet (50 mg total) by mouth every 6 (six) hours as needed. 30 tablet 0   No current facility-administered medications on file prior to visit.     Allergies  Allergen Reactions  . Pirfenidone Diarrhea and Nausea And Vomiting  . Aspirin     rash    Family History  Problem Relation Age of Onset  . Cancer Father     Prostate Cancer  . Cancer Sister     Breast Cancer  . Diabetes Sister   . Diabetes Sister   . Other Mother     pneumonia   . Cancer Sister       breast cancer     BP 122/70   Pulse (!) 59   Wt 125 lb (56.7 kg)   SpO2 96%   BMI 24.41 kg/m   Review of Systems No weight change    Objective:   Physical Exam VITAL SIGNS:  See vs page GENERAL: no distress Pulses: dorsalis pedis intact bilat.  MSK: no deformity of the feet CV: 1+ left, and trace right leg edema Skin: no ulcer on the feet. normal color and temp on the feet. Neuro: sensation is intact to touch on the feet    Lab Results  Component Value Date   HGBA1C 7.5 02/08/2016      Assessment & Plan:  Insulin-requiring type 2 DM: she needs increased rx Weight loss: we'll follow

## 2016-03-20 ENCOUNTER — Other Ambulatory Visit: Payer: Self-pay

## 2016-03-20 MED ORDER — INSULIN PEN NEEDLE 32G X 5 MM MISC
2 refills | Status: DC
Start: 2016-03-20 — End: 2018-02-24

## 2016-04-04 IMAGING — CR DG CHEST 1V PORT
1 series · 1 of 1 positions shown · non-contrast
Comparison: CT of the chest dated 12/01/2014

CLINICAL DATA: Shortness of breath, weakness, diaphoresis, nausea.

EXAM:
PORTABLE CHEST 1 VIEW

[ap portable]
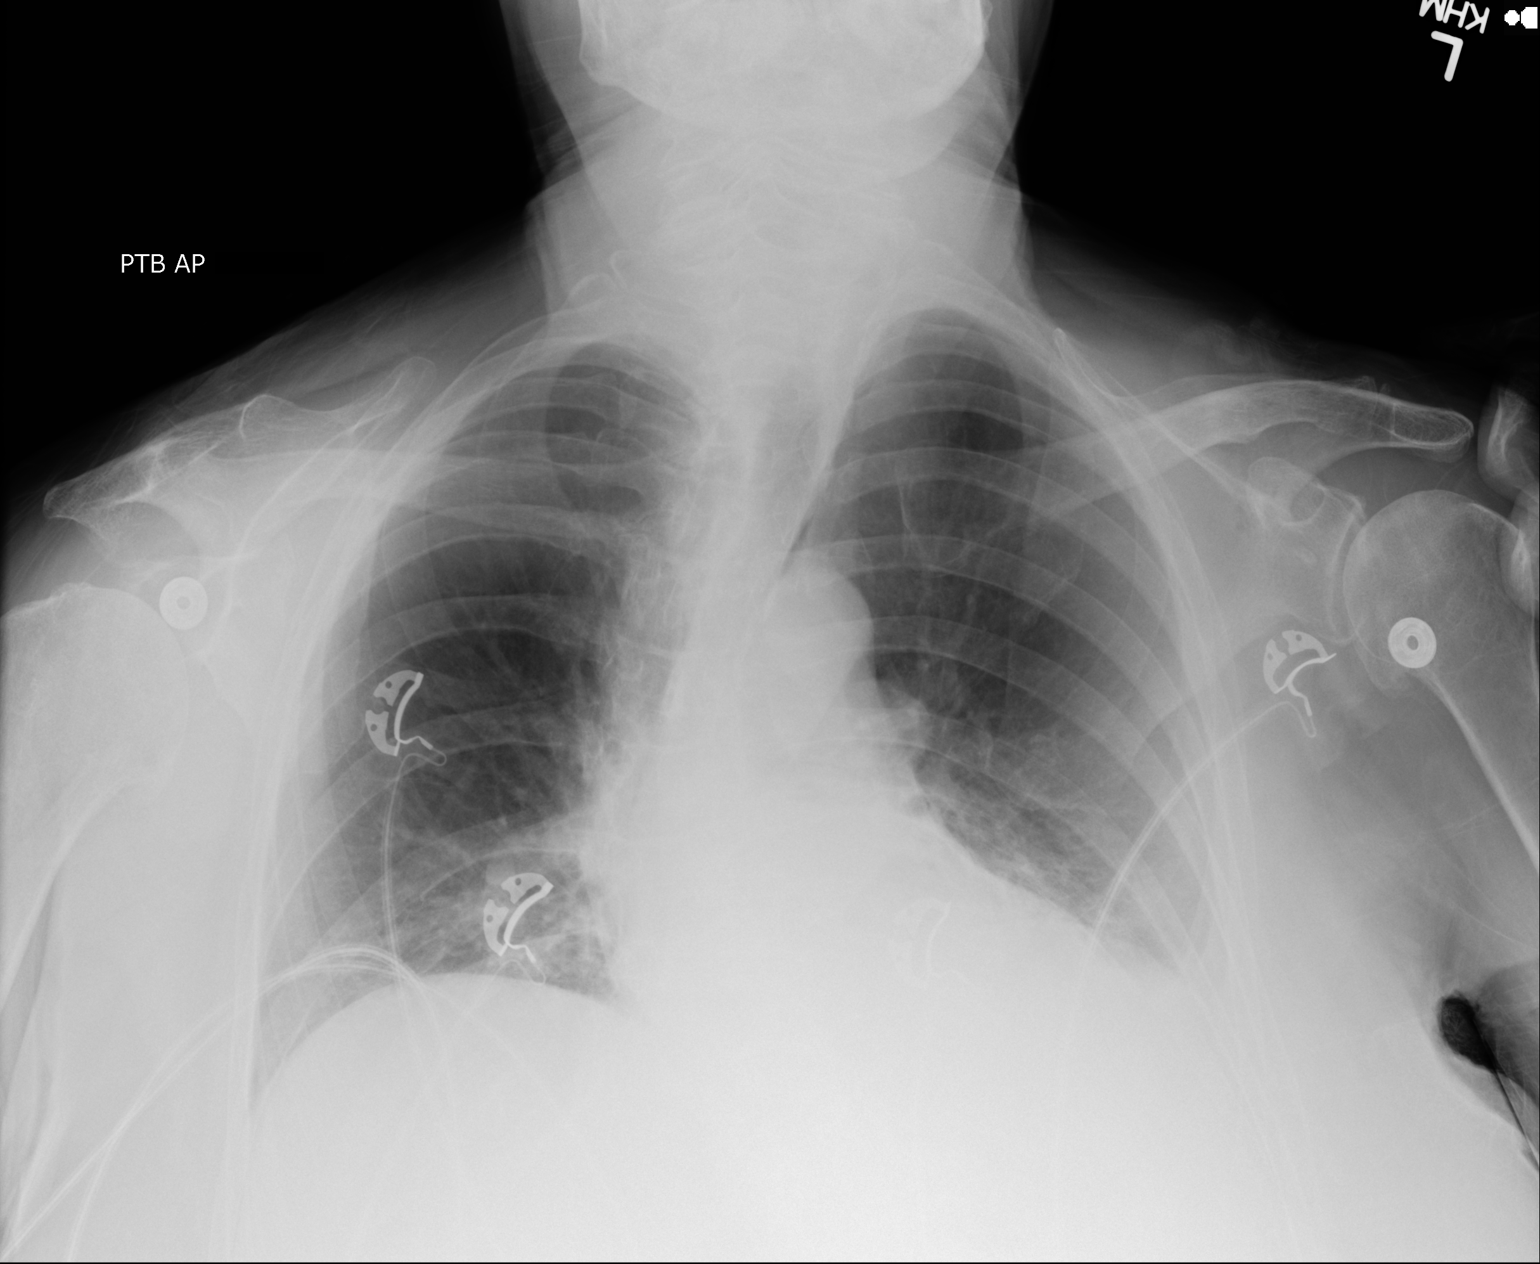

[1 of 1 positions shown; findings below may reference images not displayed]

FINDINGS: Cardiomediastinal silhouette is stably enlarged. Mediastinal
contours appear intact.

There is left retrocardiac airspace opacity. There is no evidence of
pneumothorax.

Osseous structures are without acute abnormality. Soft tissues are
grossly normal.
IMPRESSION: Left retrocardiac airspace opacity which may represent left pleural
effusion and/or left lower lobe atelectasis versus airspace
consolidation.

Stably enlarged cardiac silhouette.

## 2016-05-02 ENCOUNTER — Ambulatory Visit (HOSPITAL_BASED_OUTPATIENT_CLINIC_OR_DEPARTMENT_OTHER): Payer: Medicare Other | Admitting: Oncology

## 2016-05-02 ENCOUNTER — Telehealth: Payer: Self-pay | Admitting: Oncology

## 2016-05-02 VITALS — BP 131/56 | HR 57 | Temp 98.0°F | Resp 18 | Ht 60.0 in | Wt 127.4 lb

## 2016-05-02 DIAGNOSIS — Z85028 Personal history of other malignant neoplasm of stomach: Secondary | ICD-10-CM

## 2016-05-02 DIAGNOSIS — Z86718 Personal history of other venous thrombosis and embolism: Secondary | ICD-10-CM

## 2016-05-02 DIAGNOSIS — C169 Malignant neoplasm of stomach, unspecified: Secondary | ICD-10-CM

## 2016-05-02 NOTE — Telephone Encounter (Signed)
Gave patient avs report and appointments for April  °

## 2016-05-02 NOTE — Progress Notes (Signed)
  [place] OFFICE PROGRESS NOTE   Diagnosis: Gastric cancer  INTERVAL HISTORY:   Tammy Hughes returns as scheduled. She feels well. Good energy level and appetite. She continues to have early satiety. She was admitted with chest pain in July. A cardiac catheterization showed minimal nonobstructive coronary artery disease.  Objective:  Vital signs in last 24 hours:  Blood pressure (!) 131/56, pulse (!) 57, temperature 98 F (36.7 C), temperature source Oral, resp. rate 18, height 5' (1.524 m), weight 127 lb 6.4 oz (57.8 kg), SpO2 99 %.    HEENT: Neck without mass Lymphatics: No cervical, supraclavicular, axillary, or inguinal nodes Resp: Diffuse and Spataro rales at the left greater than right chest, no respiratory distress Cardio: Regular rate and rhythm GI: No hepatomegaly, no mass, nontender Vascular: No leg edema, right lower leg is slightly larger than the left side   Medications: I have reviewed the patient's current medications.  Assessment/Plan: 1. Gastric cancer, status post an endoscopic biopsy of a lesser curvature mass on 05/31/2014 confirming adenocarcinoma ? Staging CTs of the chest and abdomen on 06/08/2014 revealed no evidence of metastatic disease ? Proximal gastrectomy and feeding jejunostomy 07/20/2014, pT2,pN1 ? Adjuvant weekly 5-Fu/leucovorin 09/14/2014, 09/21/2014, 09/28/2014 and 10/05/2014 ? Initiation of radiation and infusional 5-FU 10/17/2014 ? 5-fluorouracil pump discontinued on 10/27/2014 due to toxicity. ? Radiation discontinued 11/02/2014  2. Pulmonary fibrosis 3. Esophageal stricture, status post a dilatation procedure 05/31/2014 4. Diabetes 5. Postoperative ventricle tachycardia, NSTEMI January 2016 6. Hospital-acquired pneumonia January 2016 7. CT abdomen/pelvis 09/02/2014 with postsurgical changes. Focal fluid collection in the midline abutting the distal greater curvature of the stomach measuring 2.2 x 2.9 x 3.4 cm. Mild  stranding in the adjacent fat. 8. PICC placement 10/17/2014 9. Rash. Most likely related to the 5-fluorouracil. Resolved. 10. Right upper extremity DVT 10/25/2014. She completed a course of Xarelto. 11. Hospitalization 10/27/2014 through 10/28/2014 nausea/vomiting and left arm discomfort. Noted to have skin toxicity from the 5-fluorouracil. The 5-fluorouracil was discontinued. 12. Depression. She continues Remeron.    Disposition:  Tammy Hughes appears stable. She is in clinical remission from gastric cancer. She is maintaining her weight. She will return for an office visit in 6 months.  [name], MD  05/02/2016  12:57 PM

## 2016-05-06 ENCOUNTER — Other Ambulatory Visit: Payer: Self-pay | Admitting: Endocrinology

## 2016-05-06 DIAGNOSIS — E119 Type 2 diabetes mellitus without complications: Secondary | ICD-10-CM | POA: Diagnosis not present

## 2016-05-06 DIAGNOSIS — Z79899 Other long term (current) drug therapy: Secondary | ICD-10-CM | POA: Diagnosis not present

## 2016-05-15 ENCOUNTER — Telehealth: Payer: Self-pay | Admitting: Endocrinology

## 2016-05-15 NOTE — Telephone Encounter (Signed)
Pt called in and requested a call back from [facility] about a refill.

## 2016-05-16 MED ORDER — TRAMADOL HCL 50 MG PO TABS: 50.0000 mg | ORAL_TABLET | Freq: Four times a day (QID) | ORAL | 0 refills | Status: DC | PRN

## 2016-05-16 NOTE — Telephone Encounter (Signed)
I printed  

## 2016-05-16 NOTE — Telephone Encounter (Signed)
See message and please advise, Thanks!  

## 2016-05-16 NOTE — Telephone Encounter (Signed)
Refill submitted. 

## 2016-05-16 NOTE — Telephone Encounter (Signed)
Tramadol needs to be called into [facility] please

## 2016-06-10 ENCOUNTER — Ambulatory Visit: Payer: Medicare Other | Admitting: Endocrinology

## 2016-06-10 ENCOUNTER — Telehealth: Payer: Self-pay | Admitting: Endocrinology

## 2016-06-10 MED ORDER — CLOPIDOGREL BISULFATE 75 MG PO TABS: 75.0000 mg | ORAL_TABLET | Freq: Every day | ORAL | 1 refills | Status: DC

## 2016-06-10 MED ORDER — ROSUVASTATIN CALCIUM 10 MG PO TABS: 10.0000 mg | ORAL_TABLET | Freq: Every day | ORAL | 3 refills | Status: DC

## 2016-06-10 NOTE — Telephone Encounter (Signed)
Pt requesting clopidogrel and rosuvastatin to be called into [facility] please  Pt is out now please do this am per pt request

## 2016-06-10 NOTE — Telephone Encounter (Signed)
Refills submitted per patient's request.  

## 2016-06-11 MED ORDER — ROSUVASTATIN CALCIUM 10 MG PO TABS: 10.0000 mg | ORAL_TABLET | Freq: Every day | ORAL | 3 refills | Status: DC

## 2016-06-11 MED ORDER — CLOPIDOGREL BISULFATE 75 MG PO TABS: 75.0000 mg | ORAL_TABLET | Freq: Every day | ORAL | 1 refills | Status: DC

## 2016-06-11 NOTE — Telephone Encounter (Signed)
Refills submitted to [facility].

## 2016-06-11 NOTE — Telephone Encounter (Signed)
Pharmacy stated that they did not receive  Medication, she is totaly out, she asked if you can sen to   [place], [place] - [place] [address] [phone] (Phone) [phone] (Fax)

## 2016-06-14 ENCOUNTER — Ambulatory Visit: Payer: Medicare Other | Admitting: Endocrinology

## 2016-06-23 NOTE — Progress Notes (Signed)
Subjective:    Patient ID: Tammy Hughes, female    DOB: Dec 13, 1932, 82 y.o.   MRN: [id number]  HPI Pt returns for f/u of diabetes mellitus:  DM type: Insulin-requiring type 2.  Dx'edBB:[id number].   Complications: polyneuropathy and CAD.  Therapy: insulin since 2005.  GDM: never.  DKA: never. Severe hypoglycemia: never.   Pancreatitis: never.  Other: she chose bid premixed insulin.   Interval history: She takes 25 units qam and 10 units qpm.  she brings a record of her cbg's which I have reviewed today.  It varies from 58-200's.  It is in general higher as the day goes on.   Past Medical History:  Diagnosis Date  . Allergy    SEASONAL  . ANXIETY 03/08/2008  . Cataract    REMOVED BILATERAL  . Depression   . DIABETES MELLITUS, TYPE I 02/23/2007  . DIVERTICULOSIS, COLON 03/08/2008  . DVT (deep venous thrombosis) ([place])   . GERD 02/23/2007  . HYPERCHOLESTEROLEMIA 02/01/2008  . HYPERTENSION 03/08/2008  . OSTEOARTHRITIS 02/23/2007  . OSTEOPOROSIS 02/23/2007  . PERIPHERAL NEUROPATHY 02/23/2007  . Stomach cancer ([place]) 07/20/14   invasive adenocarcinoma w/signet rings features  . Urolithiasis     Past Surgical History:  Procedure Laterality Date  . CARDIAC CATHETERIZATION N/A 02/02/2016   Procedure: Left Heart Cath and Coronary Angiography;  Surgeon: Peter M Martinique, MD;  Location: [facility];  Service: Cardiovascular;  Laterality: N/A;  . CHOLECYSTECTOMY  1995  . ESOPHAGOGASTRODUODENOSCOPY  06/08/2002  . GASTRECTOMY N/A 07/20/2014   Procedure: PROXIMAL GASTRECTOMY;  Surgeon: Stark Klein, MD;  Location: [facility];  Service: General;  Laterality: N/A;  . GASTROJEJUNOSTOMY N/A 07/20/2014   Procedure: GASTROJEJUNOSTOMY;  Surgeon: Stark Klein, MD;  Location: [facility];  Service: General;  Laterality: N/A;  . LAPAROSCOPY N/A 07/20/2014   Procedure: LAPAROSCOPY DIAGNOSTIC;  Surgeon: Stark Klein, MD;  Location: [facility];  Service: General;  Laterality: N/A;    Social History   Social History  .  Marital status: Married    Spouse name: N/A  . Number of children: N/A  . Years of education: N/A   Occupational History  . Retired Retired   Social History Main Topics  . Smoking status: Never Smoker  . Smokeless tobacco: Never Used  . Alcohol use No  . Drug use: No  . Sexual activity: Not on file   Other Topics Concern  . Not on file   Social History Narrative   Dentist          Current Outpatient Prescriptions on File Prior to Visit  Medication Sig Dispense Refill  . clopidogrel (PLAVIX) 75 MG tablet Take 1 tablet (75 mg total) by mouth daily. 90 tablet 1  . Insulin Pen Needle (CAREFINE PEN NEEDLES) 32G X 5 MM MISC Use to inject insulin 2 times per day. 200 each 2  . megestrol (MEGACE ES) 625 MG/5ML suspension TAKE 1 TEASPOONFUL BY MOUTH DAILY AS DIRECTED 150 mL 2  . metoprolol tartrate (LOPRESSOR) 25 MG tablet TAKE 1 TABLET TWICE A DAY 180 tablet 1  . mirtazapine (REMERON) 15 MG tablet TAKE 1 TABLET BY MOUTH EACH NIGHT AT BEDTIME IF NEEDED 30 tablet 11  . nitroGLYCERIN (NITROSTAT) 0.4 MG SL tablet Place 1 tablet (0.4 mg total) under the tongue every 5 (five) minutes x 3 doses as needed for chest pain. 30 tablet 12  . pantoprazole (PROTONIX) 40 MG tablet Take 1 tablet (40 mg total) by mouth daily. 30 tablet  3  . rosuvastatin (CRESTOR) 10 MG tablet Take 1 tablet (10 mg total) by mouth daily. 90 tablet 3  . traMADol (ULTRAM) 50 MG tablet Take 1 tablet (50 mg total) by mouth every 6 (six) hours as needed. 50 tablet 0   No current facility-administered medications on file prior to visit.     Allergies  Allergen Reactions  . Pirfenidone Diarrhea and Nausea And Vomiting  . Aspirin     rash    Family History  Problem Relation Age of Onset  . Cancer Father     Prostate Cancer  . Cancer Sister     Breast Cancer  . Diabetes Sister   . Diabetes Sister   . Other Mother     pneumonia   . Cancer Sister     breast cancer     BP 128/64   Pulse 61    Wt 128 lb (58.1 kg)   SpO2 95%   BMI 25.00 kg/m    Review of Systems She has gained 3 lbs    Objective:   Physical Exam VITAL SIGNS:  See vs page GENERAL: no distress. Pulses: dorsalis pedis intact bilat.  MSK: no deformity of the feet.  CV: no leg edema.  Skin: no ulcer on the feet. normal color and temp on the feet.  Neuro: sensation is intact to touch on the feet.    A1c=7.6%     Assessment & Plan:  Type 2 DM, with CAD: overcontrolled given this regimen, which does match insulin to her changing needs throughout the day.   Subjective:   Patient here for Medicare annual wellness visit and management of other chronic and acute problems.     Risk factors: advanced age    46 of Physicians Providing Medical Care to Patient:  See "snapshot"   Activities of Daily Living: In your present state of health, do you have any difficulty performing the following activities (lives with husband)?:  Preparing food and eating?: No  Bathing yourself: No  Getting dressed: No  Using the toilet:No  Moving around from place to place: No  In the past year have you fallen or had a near fall?:No    Home Safety: Has smoke detector and wears seat belts. No firearms.  Diet and Exercise  Current exercise habits: pt says good Dietary issues discussed: pt wants to gain a few lbs, since cancer rx is finished.  Depression Screen  Q1: Over the past two weeks, have you felt down, depressed or hopeless?no  Q2: Over the past two weeks, have you felt little interest or pleasure in doing things? no   The following portions of the patient's history were reviewed and updated as appropriate: allergies, current medications, past family history, past medical history, past social history, past surgical history and problem list.   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  She declines VA today.     Hearing: grossly normal Body mass index:  See vs page Msk: pt easily and quickly performs "get-up-and-go" from a sitting position Cognitive Impairment Assessment: cognition, memory and judgment appear normal.  remembers 3/3 at 5 minutes.  excellent recall.  can easily read and write a sentence.  alert and oriented x 3.    Assessment:   Medicare wellness utd on preventive parameters    Plan:   During the course of the visit the patient was educated and counseled  about appropriate screening and preventive services including:       Fall prevention   Screening mammography  Bone densitometry screening  Diabetes screening  Nutrition counseling   Vaccines / LABS Zostavax / Pneumococcal Vaccine  today   Patient Instructions (the written plan) was given to the patient.

## 2016-06-25 ENCOUNTER — Encounter: Payer: Self-pay | Admitting: Endocrinology

## 2016-06-25 ENCOUNTER — Ambulatory Visit (INDEPENDENT_AMBULATORY_CARE_PROVIDER_SITE_OTHER): Payer: Medicare Other | Admitting: Endocrinology

## 2016-06-25 VITALS — BP 128/64 | HR 61 | Wt 128.0 lb

## 2016-06-25 DIAGNOSIS — IMO0001 Reserved for inherently not codable concepts without codable children: Secondary | ICD-10-CM

## 2016-06-25 DIAGNOSIS — Z23 Encounter for immunization: Secondary | ICD-10-CM | POA: Diagnosis not present

## 2016-06-25 DIAGNOSIS — M810 Age-related osteoporosis without current pathological fracture: Secondary | ICD-10-CM | POA: Diagnosis not present

## 2016-06-25 DIAGNOSIS — Z0000 Encounter for general adult medical examination without abnormal findings: Secondary | ICD-10-CM

## 2016-06-25 DIAGNOSIS — Z794 Long term (current) use of insulin: Secondary | ICD-10-CM | POA: Diagnosis not present

## 2016-06-25 DIAGNOSIS — E119 Type 2 diabetes mellitus without complications: Secondary | ICD-10-CM | POA: Diagnosis not present

## 2016-06-25 DIAGNOSIS — Z2911 Encounter for prophylactic immunotherapy for respiratory syncytial virus (RSV): Secondary | ICD-10-CM | POA: Diagnosis not present

## 2016-06-25 LAB — POCT GLYCOSYLATED HEMOGLOBIN (HGB A1C): HEMOGLOBIN A1C: 7.6

## 2016-06-25 MED ORDER — INSULIN ASPART PROT & ASPART (70-30 MIX) 100 UNIT/ML PEN: PEN_INJECTOR | SUBCUTANEOUS | 11 refills | Status: DC

## 2016-06-25 NOTE — Patient Instructions (Addendum)
Please consider these measures for your health:  minimize alcohol.  Do not use tobacco products.  Have a colonoscopy at least every 10 years from age 80.  Women should have an annual mammogram from age 13.  Keep firearms safely stored.  Always use seat belts.  have working smoke alarms in your home.  See an eye doctor and dentist regularly.  Never drive under the influence of alcohol or drugs (including prescription drugs).   It is critically important to prevent falling down (keep floor areas well-lit, dry, and free of loose objects.  If you have a cane, walker, or wheelchair, you should use it, even for short trips around the house.  Wear flat-soled shoes.  Also, try not to rush).  Please reduce the insulin to 24 units with breakfast, and 9 units with supper.  Please come back for a follow-up appointment in 4 months.

## 2016-06-25 NOTE — Progress Notes (Signed)
we discussed code status.  pt requests full code, but would not want to be started or maintained on artificial life-support measures if there was not a reasonable chance of recovery 

## 2016-07-20 ENCOUNTER — Other Ambulatory Visit: Payer: Self-pay | Admitting: Endocrinology

## 2016-07-22 ENCOUNTER — Telehealth: Payer: Self-pay | Admitting: Endocrinology

## 2016-07-22 NOTE — Telephone Encounter (Signed)
Refill submitted on 07/20/2016.

## 2016-07-22 NOTE — Telephone Encounter (Signed)
Pt needs her Nexium refilled and sent to her home delivery pharmacy.

## 2016-09-05 ENCOUNTER — Other Ambulatory Visit: Payer: Self-pay | Admitting: Endocrinology

## 2016-10-02 ENCOUNTER — Telehealth: Payer: Self-pay | Admitting: Endocrinology

## 2016-10-02 ENCOUNTER — Other Ambulatory Visit: Payer: Self-pay | Admitting: Endocrinology

## 2016-10-02 NOTE — Telephone Encounter (Signed)
Patient need a refill od her test strip  for her contour meter.  [facility] [address] [place] - [place] (Phone) [phone] (Fax)

## 2016-10-02 NOTE — Telephone Encounter (Signed)
Patient need a refill of her countor test strips    Send to   [place], [place] (Phone) [phone] (Fax)

## 2016-10-03 ENCOUNTER — Other Ambulatory Visit: Payer: Self-pay

## 2016-10-03 MED ORDER — GLUCOSE BLOOD VI STRP: ORAL_STRIP | 5 refills | Status: DC

## 2016-10-03 MED ORDER — GLUCOSE BLOOD VI STRP: ORAL_STRIP | 1 refills | Status: DC

## 2016-10-03 MED ORDER — GLUCOSE BLOOD VI STRP
ORAL_STRIP | 1 refills | Status: DC
Start: 2016-10-03 — End: 2016-10-03

## 2016-10-03 NOTE — Telephone Encounter (Signed)
Refill submitted. 

## 2016-10-03 NOTE — Telephone Encounter (Signed)
For the test strips the pharmacy cannot see the dx code she is asking that we put it in the sig thank you

## 2016-10-03 NOTE — Telephone Encounter (Signed)
Rx resubmitted

## 2016-10-15 MED ORDER — ROSUVASTATIN CALCIUM 10 MG PO TABS: 10.0000 mg | ORAL_TABLET | Freq: Every day | ORAL | 3 refills | Status: DC

## 2016-10-15 NOTE — Telephone Encounter (Signed)
atena need authorization on medication  rosuvastatin (CRESTOR) 10 MG tablet 90 tablet     [phone]

## 2016-10-15 NOTE — Telephone Encounter (Signed)
Rx submitted

## 2016-10-22 ENCOUNTER — Telehealth: Payer: Self-pay | Admitting: *Deleted

## 2016-10-22 ENCOUNTER — Telehealth: Payer: Self-pay | Admitting: Endocrinology

## 2016-10-22 NOTE — Telephone Encounter (Signed)
"  Someone just called about an appointment for Tammy Hughes and I couldn't get it all."  Provided appointment information.  Encouraged to arrive at 12:00 to register for the 12:30 appointment with Dr. Benay Spice on 10-24-2016.  No further questions.

## 2016-10-22 NOTE — Telephone Encounter (Signed)
I contacted the patient and advised of message. She agreed to going off the rosuvastain at this time.

## 2016-10-22 NOTE — Telephone Encounter (Signed)
please call patient: Ins wants you to change rosuvastatin to another med.  As your cholesterol was really low last time, please try stopping it.  I'll see you next time.

## 2016-10-24 ENCOUNTER — Telehealth: Payer: Self-pay | Admitting: Oncology

## 2016-10-24 ENCOUNTER — Ambulatory Visit (HOSPITAL_BASED_OUTPATIENT_CLINIC_OR_DEPARTMENT_OTHER): Payer: Medicare Other | Admitting: Oncology

## 2016-10-24 VITALS — BP 138/46 | HR 63 | Temp 98.0°F | Resp 17 | Ht 60.0 in | Wt 126.2 lb

## 2016-10-24 DIAGNOSIS — Z86718 Personal history of other venous thrombosis and embolism: Secondary | ICD-10-CM | POA: Diagnosis not present

## 2016-10-24 DIAGNOSIS — F329 Major depressive disorder, single episode, unspecified: Secondary | ICD-10-CM | POA: Diagnosis not present

## 2016-10-24 DIAGNOSIS — Z85028 Personal history of other malignant neoplasm of stomach: Secondary | ICD-10-CM

## 2016-10-24 DIAGNOSIS — C169 Malignant neoplasm of stomach, unspecified: Secondary | ICD-10-CM

## 2016-10-24 NOTE — Telephone Encounter (Signed)
Gave patient AVS and calender per 4/12 los.  

## 2016-10-24 NOTE — Progress Notes (Signed)
  [facility] OFFICE PROGRESS NOTE   Diagnosis: Gastric cancer  INTERVAL HISTORY:   [name] returns as scheduled. She feels well. She reports frequent belching. No dysphagia. She eats a large meal in the mornings and snacks during the day. She is getting out of the house. Intermittent low back pain with activity. No other pain.  Objective:  Vital signs in last 24 hours:  Blood pressure (!) 138/46, pulse 63, temperature 98 F (36.7 C), temperature source Oral, resp. rate 17, height 5' (1.524 m), weight 126 lb 3.2 oz (57.2 kg), SpO2 98 %.    HEENT: Neck without mass Lymphatics: No cervical, supraclavicular, axillary, or inguinal nodes Resp: Lungs clear bilaterally Cardio: Regular rate and rhythm GI: No hepatosplenomegaly, no mass, nontender Vascular: Right lower leg larger than the left side, no edema    Medications: I have reviewed the patient's current medications.  Assessment/Plan: 1. Gastric cancer, status post an endoscopic biopsy of a lesser curvature mass on 05/31/2014 confirming adenocarcinoma ? Staging CTs of the chest and abdomen on 06/08/2014 revealed no evidence of metastatic disease ? Proximal gastrectomy and feeding jejunostomy 07/20/2014, pT2,pN1 ? Adjuvant weekly 5-Fu/leucovorin 09/14/2014, 09/21/2014, 09/28/2014 and 10/05/2014 ? Initiation of radiation and infusional 5-FU 10/17/2014 ? 5-fluorouracil pump discontinued on 10/27/2014 due to toxicity. ? Radiation discontinued 11/02/2014  2. Pulmonary fibrosis 3. Esophageal stricture, status post a dilatation procedure 05/31/2014 4. Diabetes 5. Postoperative ventricle tachycardia, NSTEMI January 2016 6. Hospital-acquired pneumonia January 2016 7. CT abdomen/pelvis 09/02/2014 with postsurgical changes. Focal fluid collection in the midline abutting the distal greater curvature of the stomach measuring 2.2 x 2.9 x 3.4 cm. Mild stranding in the adjacent fat. 8. PICC placement 10/17/2014 9. Rash.  Most likely related to the 5-fluorouracil. Resolved. 10. Right upper extremity DVT 10/25/2014. She completed a course of Xarelto. 11. Hospitalization 10/27/2014 through 10/28/2014 nausea/vomiting and left arm discomfort. Noted to have skin toxicity from the 5-fluorouracil. The 5-fluorouracil was discontinued. 12. Depression. She continues Remeron.    Disposition:  Tammy Hughes remains in clinical remission from gastric cancer. She will return for an office visit in 6 months.  15 minutes were spent with the patient today. The majority of the time was used for counseling and coordination of care.  Tammy Coder, MD  10/24/2016  1:12 PM

## 2016-10-25 ENCOUNTER — Encounter: Payer: Self-pay | Admitting: Endocrinology

## 2016-10-25 ENCOUNTER — Ambulatory Visit (INDEPENDENT_AMBULATORY_CARE_PROVIDER_SITE_OTHER): Payer: Medicare Other | Admitting: Endocrinology

## 2016-10-25 VITALS — BP 130/62 | HR 63 | Ht 60.0 in | Wt 125.0 lb

## 2016-10-25 DIAGNOSIS — Z794 Long term (current) use of insulin: Secondary | ICD-10-CM | POA: Diagnosis not present

## 2016-10-25 DIAGNOSIS — E119 Type 2 diabetes mellitus without complications: Secondary | ICD-10-CM

## 2016-10-25 DIAGNOSIS — IMO0001 Reserved for inherently not codable concepts without codable children: Secondary | ICD-10-CM

## 2016-10-25 LAB — POCT GLYCOSYLATED HEMOGLOBIN (HGB A1C): Hemoglobin A1C: 7.4

## 2016-10-25 MED ORDER — INSULIN ASPART PROT & ASPART (70-30 MIX) 100 UNIT/ML PEN: PEN_INJECTOR | SUBCUTANEOUS | 11 refills | Status: DC

## 2016-10-25 NOTE — Patient Instructions (Addendum)
check your blood sugar twice a day.  vary the time of day when you check, between before the 3 meals, and at bedtime.  also check if you have symptoms of your blood sugar being too high or too low.  please keep a record of the readings and bring it to your next appointment here (or you can bring the meter itself).  You can write it on any piece of paper.  please call us sooner if your blood sugar goes below 70, or if you have a lot of readings over 200.  Please reduce the insulin to 20 units with breakfast, and 9 units with supper.   Please come back for a follow-up appointment in 4 months.

## 2016-10-25 NOTE — Progress Notes (Signed)
Subjective:    Patient ID: Tammy Hughes, female    DOB: September 19, 1932, 83 y.o.   MRN: [id number]  HPI Pt returns for f/u of diabetes mellitus:  DM type: Insulin-requiring type 2.  Dx'ed: 5638.   Complications: polyneuropathy and CAD.  Therapy: insulin since 2005.  GDM: never.  DKA: never. Severe hypoglycemia: never.   Pancreatitis: never.  Other: she chose bid premixed insulin.   Interval history: She takes 24 units qam and 9 units qpm.  she brings a record of her cbg's which I have reviewed today.  It varies from 55-200's.  It is in general higher fasting, but not necessarily so.  Past Medical History:  Diagnosis Date  . Allergy    SEASONAL  . ANXIETY 03/08/2008  . Cataract    REMOVED BILATERAL  . Depression   . DIABETES MELLITUS, TYPE I 02/23/2007  . DIVERTICULOSIS, COLON 03/08/2008  . DVT (deep venous thrombosis) ([place])   . GERD 02/23/2007  . HYPERCHOLESTEROLEMIA 02/01/2008  . HYPERTENSION 03/08/2008  . OSTEOARTHRITIS 02/23/2007  . OSTEOPOROSIS 02/23/2007  . PERIPHERAL NEUROPATHY 02/23/2007  . Stomach cancer ([place]) 07/20/14   invasive adenocarcinoma w/signet rings features  . Urolithiasis     Past Surgical History:  Procedure Laterality Date  . CARDIAC CATHETERIZATION N/A 02/02/2016   Procedure: Left Heart Cath and Coronary Angiography;  Surgeon: Peter M Martinique, MD;  Location: [facility];  Service: Cardiovascular;  Laterality: N/A;  . CHOLECYSTECTOMY  1995  . ESOPHAGOGASTRODUODENOSCOPY  06/08/2002  . GASTRECTOMY N/A 07/20/2014   Procedure: PROXIMAL GASTRECTOMY;  Surgeon: Stark Klein, MD;  Location: [facility];  Service: General;  Laterality: N/A;  . GASTROJEJUNOSTOMY N/A 07/20/2014   Procedure: GASTROJEJUNOSTOMY;  Surgeon: Stark Klein, MD;  Location: [facility];  Service: General;  Laterality: N/A;  . LAPAROSCOPY N/A 07/20/2014   Procedure: LAPAROSCOPY DIAGNOSTIC;  Surgeon: Stark Klein, MD;  Location: [facility];  Service: General;  Laterality: N/A;    Social History   Social  History  . Marital status: Married    Spouse name: N/A  . Number of children: N/A  . Years of education: N/A   Occupational History  . Retired Retired   Social History Main Topics  . Smoking status: Never Smoker  . Smokeless tobacco: Never Used  . Alcohol use No  . Drug use: No  . Sexual activity: Not on file   Other Topics Concern  . Not on file   Social History Narrative   Dentist          Current Outpatient Prescriptions on File Prior to Visit  Medication Sig Dispense Refill  . clopidogrel (PLAVIX) 75 MG tablet Take 1 tablet (75 mg total) by mouth daily. 90 tablet 1  . esomeprazole (NEXIUM) 40 MG capsule TAKE 1 CAPSULE DAILY AT    NOON 30 capsule 8  . glucose blood (BAYER CONTOUR TEST) test strip USE ONE STRIP TO CHECK GLUCOSE THREE TIMES DAILY dx code: E11.9 200 each 5  . Insulin Pen Needle (CAREFINE PEN NEEDLES) 32G X 5 MM MISC Use to inject insulin 2 times per day. 200 each 2  . metoprolol tartrate (LOPRESSOR) 25 MG tablet TAKE 1 TABLET TWICE A DAY 180 tablet 1  . mirtazapine (REMERON) 15 MG tablet TAKE 1 TABLET BY MOUTH EACH NIGHT AT BEDTIME IF NEEDED 30 tablet 11  . nitroGLYCERIN (NITROSTAT) 0.4 MG SL tablet Place 1 tablet (0.4 mg total) under the tongue every 5 (five) minutes x 3 doses as needed for  chest pain. 30 tablet 12  . traMADol (ULTRAM) 50 MG tablet Take 1 tablet (50 mg total) by mouth every 6 (six) hours as needed. 50 tablet 0   No current facility-administered medications on file prior to visit.     Allergies  Allergen Reactions  . Pirfenidone Diarrhea and Nausea And Vomiting  . Aspirin     rash    Family History  Problem Relation Age of Onset  . Cancer Father     Prostate Cancer  . Cancer Sister     Breast Cancer  . Diabetes Sister   . Diabetes Sister   . Other Mother     pneumonia   . Cancer Sister     breast cancer     BP 130/62   Pulse 63   Ht 5' (1.524 m)   Wt 125 lb (56.7 kg)   SpO2 96%   BMI 24.41 kg/m     Review of Systems Denies LOC.  She has lost a few lbs.      Objective:   Physical Exam VITAL SIGNS:  See vs page GENERAL: no distress. Pulses: dorsalis pedis intact bilat.  MSK: no deformity of the feet.  CV: no leg edema.  Skin: no ulcer on the feet, but the skin is dry. normal color and temp on the feet.  Neuro: sensation is intact to touch on the feet.   a1c=7.4%    Assessment & Plan:  Insulin-requiring type 2 DM, with CAD: slightly overcontrolled, given this regimen, which does match insulin to her changing needs throughout the day. Weight loss, worse: we discussed the option of going to the grocery store, to address food preferences.   Patient Instructions  check your blood sugar twice a day.  vary the time of day when you check, between before the 3 meals, and at bedtime.  also check if you have symptoms of your blood sugar being too high or too low.  please keep a record of the readings and bring it to your next appointment here (or you can bring the meter itself).  You can write it on any piece of paper.  please call us sooner if your blood sugar goes below 70, or if you have a lot of readings over 200.  Please reduce the insulin to 20 units with breakfast, and 9 units with supper.   Please come back for a follow-up appointment in 4 months.

## 2016-11-24 ENCOUNTER — Encounter (HOSPITAL_COMMUNITY): Payer: Self-pay | Admitting: Vascular Surgery

## 2016-11-24 ENCOUNTER — Emergency Department (HOSPITAL_COMMUNITY)
Admission: EM | Admit: 2016-11-24 | Discharge: 2016-11-24 | Disposition: A | Discharge status: Home / Self Care | Payer: Medicare Other | Attending: Emergency Medicine | Admitting: Emergency Medicine

## 2016-11-24 DIAGNOSIS — Z85028 Personal history of other malignant neoplasm of stomach: Secondary | ICD-10-CM | POA: Insufficient documentation

## 2016-11-24 DIAGNOSIS — Z79899 Other long term (current) drug therapy: Secondary | ICD-10-CM | POA: Diagnosis not present

## 2016-11-24 DIAGNOSIS — I10 Essential (primary) hypertension: Secondary | ICD-10-CM | POA: Diagnosis not present

## 2016-11-24 DIAGNOSIS — R55 Syncope and collapse: Secondary | ICD-10-CM | POA: Diagnosis not present

## 2016-11-24 DIAGNOSIS — E109 Type 1 diabetes mellitus without complications: Secondary | ICD-10-CM | POA: Diagnosis not present

## 2016-11-24 LAB — I-STAT TROPONIN, ED: TROPONIN I, POC: 0.05 ng/mL (ref 0.00–0.08)

## 2016-11-24 LAB — CBC
HEMATOCRIT: 36.8 % (ref 36.0–46.0)
HEMOGLOBIN: 12.1 g/dL (ref 12.0–15.0)
MCH: 28.5 pg (ref 26.0–34.0)
MCHC: 32.9 g/dL (ref 30.0–36.0)
MCV: 86.8 fL (ref 78.0–100.0)
Platelets: 284 10*3/uL (ref 150–400)
RBC: 4.24 MIL/uL (ref 3.87–5.11)
RDW: 15.4 % (ref 11.5–15.5)
WBC: 6 10*3/uL (ref 4.0–10.5)

## 2016-11-24 LAB — URINALYSIS, ROUTINE W REFLEX MICROSCOPIC
Bilirubin Urine: NEGATIVE
Glucose, UA: NEGATIVE mg/dL
Hgb urine dipstick: NEGATIVE
Ketones, ur: NEGATIVE mg/dL
Nitrite: NEGATIVE
PROTEIN: NEGATIVE mg/dL
Specific Gravity, Urine: 1.01 (ref 1.005–1.030)
pH: 6 (ref 5.0–8.0)

## 2016-11-24 LAB — BASIC METABOLIC PANEL
ANION GAP: 11 (ref 5–15)
BUN: 11 mg/dL (ref 6–20)
CALCIUM: 10.5 mg/dL — AB (ref 8.9–10.3)
CO2: 23 mmol/L (ref 22–32)
Chloride: 105 mmol/L (ref 101–111)
Creatinine, Ser: 1.06 mg/dL — ABNORMAL HIGH (ref 0.44–1.00)
GFR calc Af Amer: 56 mL/min — ABNORMAL LOW (ref >60)
GFR calc non Af Amer: 48 mL/min — ABNORMAL LOW (ref >60)
GLUCOSE: 137 mg/dL — AB (ref 65–99)
Potassium: 3.9 mmol/L (ref 3.5–5.1)
Sodium: 139 mmol/L (ref 135–145)

## 2016-11-24 LAB — CBG MONITORING, ED: GLUCOSE-CAPILLARY: 101 mg/dL — AB (ref 65–99)

## 2016-11-24 NOTE — Discharge Instructions (Signed)
I think you passed out because of an emotional reaction to seeing your granddaughter in the hospital.  I don't think there is anything dangerous in your brain, heart, or lungs that caused this, and it can be a normal reaction to stress.  If you pass out again, or have chest pain, shortness of breath, trouble speaking, weakness or numbness in an arm or leg, or droop on one side of your face, please come back to the ED.

## 2016-11-24 NOTE — ED Provider Notes (Signed)
[facility] DEPT Provider Note   CSN: [id number] Arrival date & time: 11/24/16  1708     History   Chief Complaint Chief Complaint  Patient presents with  . Loss of Consciousness    HPI Tammy Hughes is a 83 y.o. female with history of DM, HTN, and gastric cancer (s/p gastrectomy, in remission) who presents with syncope.  She was in her usual state of health this morning with no symptoms or complaints.  This afternoon she received a call that her granddaughter had been in car crash and was in the hospital.  She came to [facility] to visit and was still feeling normal until she walked into the hospital room and saw her granddaughter 3-4 hours ago.  Then she began to feel lightheaded, hot, and diaphoretic.  Some caught her and helped her to a chair before she could fall, and she remembers people telling her to keep breathing.  She regained consciousness and had a dull headache but was otherwise asymptomatic.  According to the patient and her husband she has been back at her baseline.  HPI  Past Medical History:  Diagnosis Date  . Allergy    SEASONAL  . ANXIETY 03/08/2008  . Cataract    REMOVED BILATERAL  . Depression   . DIABETES MELLITUS, TYPE I 02/23/2007  . DIVERTICULOSIS, COLON 03/08/2008  . DVT (deep venous thrombosis) ([place])   . GERD 02/23/2007  . HYPERCHOLESTEROLEMIA 02/01/2008  . HYPERTENSION 03/08/2008  . OSTEOARTHRITIS 02/23/2007  . OSTEOPOROSIS 02/23/2007  . PERIPHERAL NEUROPATHY 02/23/2007  . Stomach cancer ([place]) 07/20/14   invasive adenocarcinoma w/signet rings features  . Urolithiasis     Patient Active Problem List   Diagnosis Date Noted  . Pain in joint, shoulder region 02/08/2016  . Abnormal nuclear stress test   . History of DVT (deep vein thrombosis) 02/01/2016  . Normocytic anemia 02/01/2016  . Sinus bradycardia 02/01/2016  . Personal history of DVT (deep vein thrombosis)   . Memory loss 08/02/2015  . Chest pain 12/05/2014  . Weakness 11/02/2014  . Left arm  pain 10/27/2014  . DVT (deep venous thrombosis) ([place]) 10/27/2014  . Cancer associated pain 10/25/2014  . Rash 10/25/2014  . Long term current use of anticoagulant 10/25/2014  . Leg edema, right 10/25/2014  . Blister of foot 10/25/2014  . Abdominal pain 08/30/2014  . Type 2 diabetes mellitus with peripheral neuropathy ([place]) 08/04/2014  . HCAP (healthcare-associated pneumonia) 07/30/2014  . Debility 07/29/2014  . Elevated troponin post op - 0.6 07/26/2014  . Type 2 diabetes mellitus ([place]) 07/26/2014  . NSVT post-op 07/24/2014  . Gastric cancer-s/p gastrectomy 07/20/14 06/15/2014  . Dysphagia 04/15/2014  . Pain in joint, lower leg 03/15/2014  . Cough 08/06/2013  . Postinflammatory pulmonary fibrosis ([place]) 08/06/2013  . NASH (nonalcoholic steatohepatitis) 10/21/2011  . Knee pain, bilateral 10/21/2011  . Encounter for long-term (current) use of other medications 11/23/2010  . Carpal tunnel syndrome of left wrist 11/23/2010  . RECTAL BLEEDING 03/21/2010  . BACK PAIN, LUMBAR 12/19/2009  . LEUKOPENIA, MILD 02/15/2009  . Anxiety state 03/08/2008  . Essential hypertension 03/08/2008  . Non-allergic rhinitis 03/08/2008  . DIVERTICULOSIS, COLON 03/08/2008  . HYPERCHOLESTEROLEMIA 02/01/2008  . Insulin dependent diabetes mellitus ([place]) 02/23/2007  . PERIPHERAL NEUROPATHY 02/23/2007  . GERD 02/23/2007  . Osteoarthritis 02/23/2007  . Osteoporosis 02/23/2007    Past Surgical History:  Procedure Laterality Date  . CARDIAC CATHETERIZATION N/A 02/02/2016   Procedure: Left Heart Cath and Coronary Angiography;  Surgeon: Ander Slade  Martinique, MD;  Location: [facility];  Service: Cardiovascular;  Laterality: N/A;  . CHOLECYSTECTOMY  1995  . ESOPHAGOGASTRODUODENOSCOPY  06/08/2002  . GASTRECTOMY N/A 07/20/2014   Procedure: PROXIMAL GASTRECTOMY;  Surgeon: Stark Klein, MD;  Location: [facility];  Service: General;  Laterality: N/A;  . GASTROJEJUNOSTOMY N/A 07/20/2014   Procedure: GASTROJEJUNOSTOMY;   Surgeon: Stark Klein, MD;  Location: [facility];  Service: General;  Laterality: N/A;  . LAPAROSCOPY N/A 07/20/2014   Procedure: LAPAROSCOPY DIAGNOSTIC;  Surgeon: Stark Klein, MD;  Location: [facility];  Service: General;  Laterality: N/A;    OB History    No data available       Home Medications    Prior to Admission medications   Medication Sig Start Date End Date Taking? Authorizing Provider  clopidogrel (PLAVIX) 75 MG tablet Take 1 tablet (75 mg total) by mouth daily. 06/11/16   Renato Shin, MD  esomeprazole (NEXIUM) 40 MG capsule TAKE 1 CAPSULE DAILY AT    Gwendolyn Fill 07/20/16   Renato Shin, MD  glucose blood (BAYER CONTOUR TEST) test strip USE ONE STRIP TO CHECK GLUCOSE THREE TIMES DAILY dx code: E11.9 10/03/16   Renato Shin, MD  insulin aspart protamine - aspart (NOVOLOG MIX 70/30 FLEXPEN) (70-30) 100 UNIT/ML FlexPen 20 units with breakfast, and 9 units with supper, and pen needles 2/day 10/25/16   Renato Shin, MD  Insulin Pen Needle (CAREFINE PEN NEEDLES) 32G X 5 MM MISC Use to inject insulin 2 times per day. 03/20/16   Renato Shin, MD  metoprolol tartrate (LOPRESSOR) 25 MG tablet TAKE 1 TABLET TWICE A DAY 09/05/16   Renato Shin, MD  mirtazapine (REMERON) 15 MG tablet TAKE 1 TABLET BY MOUTH EACH NIGHT AT BEDTIME IF NEEDED 05/06/16   Renato Shin, MD  nitroGLYCERIN (NITROSTAT) 0.4 MG SL tablet Place 1 tablet (0.4 mg total) under the tongue every 5 (five) minutes x 3 doses as needed for chest pain. 02/03/16   Rai, Vernelle Emerald, MD  traMADol (ULTRAM) 50 MG tablet Take 1 tablet (50 mg total) by mouth every 6 (six) hours as needed. 05/16/16   Renato Shin, MD    Family History Family History  Problem Relation Age of Onset  . Cancer Father        Prostate Cancer  . Cancer Sister        Breast Cancer  . Diabetes Sister   . Diabetes Sister   . Other Mother        pneumonia   . Cancer Sister        breast cancer     Social History Social History  Substance Use Topics  . Smoking status:  Never Smoker  . Smokeless tobacco: Never Used  . Alcohol use No     Allergies   Pirfenidone and Aspirin   Review of Systems Review of Systems  Constitutional: Negative for chills and fever.  Respiratory: Negative for cough and shortness of breath.   Cardiovascular: Positive for leg swelling. Negative for chest pain and palpitations.  Gastrointestinal: Negative for blood in stool, constipation, diarrhea, nausea and vomiting.  Genitourinary: Negative for dysuria and hematuria.  Neurological: Positive for dizziness, syncope and headaches.     Physical Exam Updated Vital Signs BP (!) 174/68   Pulse (!) 58   Temp 97.9 F (36.6 C) (Oral)   Resp 11   SpO2 99%   Orthostatic VS for the past 24 hrs:  BP- Lying Pulse- Lying BP- Sitting Pulse- Sitting BP- Standing at 0  minutes Pulse- Standing at 0 minutes  11/24/16 2015 140/67 54 153/62 54 157/69 60   Physical Exam  Constitutional: She is oriented to person, place, and time. She appears well-developed and well-nourished. No distress.  HENT:  Head: Normocephalic and atraumatic.  Eyes: Conjunctivae are normal. No scleral icterus.  Neck: Normal range of motion. Neck supple.  Cardiovascular: Normal rate and regular rhythm.   Pulmonary/Chest: Effort normal and breath sounds normal.  Abdominal: Soft. She exhibits no distension. There is no tenderness.  Musculoskeletal: She exhibits no edema or tenderness.  Neurological: She is alert and oriented to person, place, and time. No cranial nerve deficit.  Strength and sensation intact throughout all extremities  Skin: Skin is dry.  Psychiatric: She has a normal mood and affect. Her behavior is normal.     ED Treatments / Results  Labs (all labs ordered are listed, but only abnormal results are displayed) Labs Reviewed  BASIC METABOLIC PANEL - Abnormal; Notable for the following:       Result Value   Glucose, Bld 137 (*)    Creatinine, Ser 1.06 (*)    Calcium 10.5 (*)    GFR calc  non Af Amer 48 (*)    GFR calc Af Amer 56 (*)    All other components within normal limits  URINALYSIS, ROUTINE W REFLEX MICROSCOPIC - Abnormal; Notable for the following:    Leukocytes, UA TRACE (*)    Bacteria, UA RARE (*)    Squamous Epithelial / LPF 0-5 (*)    All other components within normal limits  CBG MONITORING, ED - Abnormal; Notable for the following:    Glucose-Capillary 101 (*)    All other components within normal limits  CBC  I-STAT TROPOININ, ED    EKG  EKG Interpretation  Date/Time:  Sunday Nov 24 2016 19:49:24 EDT Ventricular Rate:  54 PR Interval:    QRS Duration: 91 QT Interval:  411 QTC Calculation: 390 R Axis:   19 Text Interpretation:  Sinus rhythm Low voltage, precordial leads Borderline T abnormalities, anterior leads No significant change since last tracing Confirmed by ALLEN  MD, ANTHONY ([id number]) on 11/24/2016 7:54:58 PM       Radiology No results found.  Procedures Procedures (including critical care time)  Medications Ordered in ED Medications - No data to display   Initial Impression / Assessment and Plan / ED Course  I have reviewed the triage vital signs and the nursing notes.  Pertinent labs & imaging results that were available during my care of the patient were reviewed by me and considered in my medical decision making (see chart for details).  83 year old with syncope immediately following emotional trigger consistent with vasovagal episode.  She is insulin dependent diabetic but is not hypoglycemic.  Normal labs, no signs/symptoms of infection, moderately hypertensive and oxygenating well.  Not associated with postural change, less likely orthostasis.  No postictal state and normal neurologic exam, low concern for stroke or seizure.  No palpitations, and trigger/prodrome make cardiogenic syncope highly unlikely.  EKG unchanged from prior without acute ischemic signs, no chest pain or dyspnea, low concern for ACS or PE. -check troponin  x1  Clinical Course as of Nov 24 2124  Sun Nov 24, 2016  2125 Troponin negative, EKG without acute ischemic changes, no ACS  [MO]    Clinical Course User Index [MO] Minus Liberty, MD    Final Clinical Impressions(s) / ED Diagnoses   Final diagnoses:  Vasovagal syncope  New Prescriptions New Prescriptions   No medications on file     Minus Liberty, MD 11/24/16 2126

## 2016-11-24 NOTE — ED Triage Notes (Signed)
Pt reports to the ED for eval of syncopal episode. She was upstairs and she received some bad news about her daughter and she began having a panic attack and had a syncopal episode. She has hx of anxiety. She was on medication but she is no longer taking it. She did not sustain a fall or hit her head. She remains anxious, tearful and tachypnic.

## 2016-11-24 NOTE — ED Provider Notes (Signed)
I saw and evaluated the patient, reviewed the resident's note and I agree with the findings and plan.   EKG Interpretation  Date/Time:  Sunday Nov 24 2016 19:49:24 EDT Ventricular Rate:  54 PR Interval:    QRS Duration: 91 QT Interval:  411 QTC Calculation: 390 R Axis:   19 Text Interpretation:  Sinus rhythm Low voltage, precordial leads Borderline T abnormalities, anterior leads No significant change since last tracing Confirmed by Braylee Bosher  MD, Clemons Salvucci ([id number]) on 11/24/2016 7:54:58 PM     This 83 year old female presents after having a syncopal episode after visiting her daughter. Patient states became very emotional and became lightheaded and dizzy. Denied any chest pain or chest pressure. Workup is reassuring here in patient likely had a vagal episode is stable for discharge   Lacretia Leigh, MD 11/24/16 2025

## 2017-02-24 ENCOUNTER — Encounter: Payer: Self-pay | Admitting: Endocrinology

## 2017-02-24 ENCOUNTER — Ambulatory Visit (INDEPENDENT_AMBULATORY_CARE_PROVIDER_SITE_OTHER): Payer: Medicare Other | Admitting: Endocrinology

## 2017-02-24 VITALS — BP 122/80 | HR 59 | Ht 60.0 in | Wt 125.1 lb

## 2017-02-24 DIAGNOSIS — Z794 Long term (current) use of insulin: Secondary | ICD-10-CM

## 2017-02-24 DIAGNOSIS — E119 Type 2 diabetes mellitus without complications: Secondary | ICD-10-CM

## 2017-02-24 LAB — POCT GLYCOSYLATED HEMOGLOBIN (HGB A1C): HEMOGLOBIN A1C: 7.2

## 2017-02-24 MED ORDER — INSULIN ASPART PROT & ASPART (70-30 MIX) 100 UNIT/ML PEN: PEN_INJECTOR | SUBCUTANEOUS | 11 refills | Status: DC

## 2017-02-24 MED ORDER — DICLOFENAC SODIUM 1 % TD GEL: 4.0000 g | Freq: Four times a day (QID) | TRANSDERMAL | 11 refills | Status: DC

## 2017-02-24 NOTE — Patient Instructions (Addendum)
check your blood sugar twice a day.  vary the time of day when you check, between before the 3 meals, and at bedtime.  also check if you have symptoms of your blood sugar being too high or too low.  please keep a record of the readings and bring it to your next appointment here (or you can bring the meter itself).  You can write it on any piece of paper.  please call us sooner if your blood sugar goes below 70, or if you have a lot of readings over 200.  Please reduce the insulin to 20 units with breakfast, and 8 units with supper.   I have sent a prescription to your pharmacy, for an anti-pain gel.  Please come back for a follow-up appointment in 3 months.

## 2017-02-24 NOTE — Progress Notes (Signed)
Subjective:    Patient ID: Tammy Hughes, female    DOB: 16-Aug-1932, 83 y.o.   MRN: [id number]  HPI Pt returns for f/u of diabetes mellitus:  DM type: Insulin-requiring type 2.  Dx'ed: 1829.   Complications: polyneuropathy and CAD.  Therapy: insulin since 2005.  GDM: never.  DKA: never. Severe hypoglycemia: never.   Pancreatitis: never.  Other: she chose bid premixed insulin.   Interval history: She takes 20 units qam and 9 units qpm.  she brings a record of her cbg's which I have reviewed today.  It varies from 60-200's. There is no trend throughout the day, but she says she has occasional mild hypoglycemia in the middle of the night.   Past Medical History:  Diagnosis Date  . Allergy    SEASONAL  . ANXIETY 03/08/2008  . Cataract    REMOVED BILATERAL  . Depression   . DIABETES MELLITUS, TYPE I 02/23/2007  . DIVERTICULOSIS, COLON 03/08/2008  . DVT (deep venous thrombosis) ([place])   . GERD 02/23/2007  . HYPERCHOLESTEROLEMIA 02/01/2008  . HYPERTENSION 03/08/2008  . OSTEOARTHRITIS 02/23/2007  . OSTEOPOROSIS 02/23/2007  . PERIPHERAL NEUROPATHY 02/23/2007  . Stomach cancer ([place]) 07/20/14   invasive adenocarcinoma w/signet rings features  . Urolithiasis     Past Surgical History:  Procedure Laterality Date  . CARDIAC CATHETERIZATION N/A 02/02/2016   Procedure: Left Heart Cath and Coronary Angiography;  Surgeon: Peter M Martinique, MD;  Location: [facility];  Service: Cardiovascular;  Laterality: N/A;  . CHOLECYSTECTOMY  1995  . ESOPHAGOGASTRODUODENOSCOPY  06/08/2002  . GASTRECTOMY N/A 07/20/2014   Procedure: PROXIMAL GASTRECTOMY;  Surgeon: Stark Klein, MD;  Location: [facility];  Service: General;  Laterality: N/A;  . GASTROJEJUNOSTOMY N/A 07/20/2014   Procedure: GASTROJEJUNOSTOMY;  Surgeon: Stark Klein, MD;  Location: [facility];  Service: General;  Laterality: N/A;  . LAPAROSCOPY N/A 07/20/2014   Procedure: LAPAROSCOPY DIAGNOSTIC;  Surgeon: Stark Klein, MD;  Location: [facility];  Service:  General;  Laterality: N/A;    Social History   Social History  . Marital status: Married    Spouse name: N/A  . Number of children: N/A  . Years of education: N/A   Occupational History  . Retired Retired   Social History Main Topics  . Smoking status: Never Smoker  . Smokeless tobacco: Never Used  . Alcohol use No  . Drug use: No  . Sexual activity: Not on file   Other Topics Concern  . Not on file   Social History Narrative   Dentist          Current Outpatient Prescriptions on File Prior to Visit  Medication Sig Dispense Refill  . clopidogrel (PLAVIX) 75 MG tablet Take 1 tablet (75 mg total) by mouth daily. 90 tablet 1  . esomeprazole (NEXIUM) 40 MG capsule TAKE 1 CAPSULE DAILY AT    NOON 30 capsule 8  . glucose blood (BAYER CONTOUR TEST) test strip USE ONE STRIP TO CHECK GLUCOSE THREE TIMES DAILY dx code: E11.9 200 each 5  . Insulin Pen Needle (CAREFINE PEN NEEDLES) 32G X 5 MM MISC Use to inject insulin 2 times per day. 200 each 2  . metoprolol tartrate (LOPRESSOR) 25 MG tablet TAKE 1 TABLET TWICE A DAY 180 tablet 1  . mirtazapine (REMERON) 15 MG tablet TAKE 1 TABLET BY MOUTH EACH NIGHT AT BEDTIME IF NEEDED 30 tablet 11  . nitroGLYCERIN (NITROSTAT) 0.4 MG SL tablet Place 1 tablet (0.4 mg total) under the  tongue every 5 (five) minutes x 3 doses as needed for chest pain. 30 tablet 12  . traMADol (ULTRAM) 50 MG tablet Take 1 tablet (50 mg total) by mouth every 6 (six) hours as needed. 50 tablet 0   No current facility-administered medications on file prior to visit.     Allergies  Allergen Reactions  . Pirfenidone Diarrhea and Nausea And Vomiting  . Aspirin     rash    Family History  Problem Relation Age of Onset  . Cancer Father        Prostate Cancer  . Cancer Sister        Breast Cancer  . Diabetes Sister   . Diabetes Sister   . Other Mother        pneumonia   . Cancer Sister        breast cancer     BP 122/80   Pulse (!) 59    Ht 5' (1.524 m)   Wt 125 lb 2 oz (56.8 kg)   SpO2 97%   BMI 24.44 kg/m   Review of Systems Denies LOC.  She has pain at both legs (rest=ambulation), and pain at the left knee.     Objective:   Physical Exam VITAL SIGNS:  See vs page GENERAL: no distress. Pulses: foot pulses are intact bilaterally.   MSK: no deformity of the feet or ankles.  CV: trace bilat leg edema.  Skin:  no ulcer on the feet or ankles.  normal color and temp on the feet and ankles Neuro: sensation is intact to touch on the feet and ankles.   Left knee: no swell/tend/warmth/erythema/effusion.  Lab Results  Component Value Date   HGBA1C 7.2 02/24/2017   Lab Results  Component Value Date   CREATININE 1.06 (H) 11/24/2016   BUN 11 11/24/2016   NA 139 11/24/2016   K 3.9 11/24/2016   CL 105 11/24/2016   CO2 23 11/24/2016      Assessment & Plan:  Insulin-requiring type 2 DM, with CAD: Based on the pattern of her cbg's, she needs some adjustment in her therapy. Knee pain, new Renal insuff: this limits pain rx options.    Patient Instructions  check your blood sugar twice a day.  vary the time of day when you check, between before the 3 meals, and at bedtime.  also check if you have symptoms of your blood sugar being too high or too low.  please keep a record of the readings and bring it to your next appointment here (or you can bring the meter itself).  You can write it on any piece of paper.  please call us sooner if your blood sugar goes below 70, or if you have a lot of readings over 200.  Please reduce the insulin to 20 units with breakfast, and 8 units with supper.   I have sent a prescription to your pharmacy, for an anti-pain gel.  Please come back for a follow-up appointment in 3 months.

## 2017-03-13 ENCOUNTER — Other Ambulatory Visit: Payer: Self-pay | Admitting: Endocrinology

## 2017-04-01 ENCOUNTER — Other Ambulatory Visit: Payer: Self-pay | Admitting: Endocrinology

## 2017-04-24 ENCOUNTER — Ambulatory Visit: Payer: Medicare Other | Admitting: Nurse Practitioner

## 2017-05-13 ENCOUNTER — Other Ambulatory Visit: Payer: Self-pay | Admitting: Endocrinology

## 2017-05-16 ENCOUNTER — Other Ambulatory Visit: Payer: Self-pay | Admitting: Endocrinology

## 2017-05-27 ENCOUNTER — Ambulatory Visit (INDEPENDENT_AMBULATORY_CARE_PROVIDER_SITE_OTHER): Payer: Medicare Other | Admitting: Endocrinology

## 2017-05-27 ENCOUNTER — Encounter: Payer: Self-pay | Admitting: Endocrinology

## 2017-05-27 VITALS — BP 122/64 | HR 62 | Wt 125.8 lb

## 2017-05-27 DIAGNOSIS — Z794 Long term (current) use of insulin: Secondary | ICD-10-CM

## 2017-05-27 DIAGNOSIS — E119 Type 2 diabetes mellitus without complications: Secondary | ICD-10-CM | POA: Diagnosis not present

## 2017-05-27 DIAGNOSIS — Z23 Encounter for immunization: Secondary | ICD-10-CM | POA: Diagnosis not present

## 2017-05-27 LAB — POCT GLYCOSYLATED HEMOGLOBIN (HGB A1C): Hemoglobin A1C: 7.3

## 2017-05-27 LAB — MICROALBUMIN / CREATININE URINE RATIO
Creatinine,U: 50.1 mg/dL
MICROALB/CREAT RATIO: 8.5 mg/g (ref 0.0–30.0)
Microalb, Ur: 4.3 mg/dL — ABNORMAL HIGH (ref 0.0–1.9)

## 2017-05-27 NOTE — Patient Instructions (Addendum)
check your blood sugar twice a day.  vary the time of day when you check, between before the 3 meals, and at bedtime.  also check if you have symptoms of your blood sugar being too high or too low.  please keep a record of the readings and bring it to your next appointment here (or you can bring the meter itself).  You can write it on any piece of paper.  please call us sooner if your blood sugar goes below 70, or if you have a lot of readings over 200.  Please continue the same insulin: 20 units with breakfast, and 8 units with supper.   Please see Dr Benay Spice soon, to follow up the cancer: [phone] Please come back for a follow-up appointment in 3 months.

## 2017-05-27 NOTE — Progress Notes (Signed)
Subjective:    Patient ID: Tammy Hughes, female    DOB: Nov 25, 1932, 83 y.o.   MRN: [id number]  HPI Pt returns for f/u of diabetes mellitus:  DM type: Insulin-requiring type 2.  Dx'ed: 3903.   Complications: polyneuropathy and CAD.  Therapy: insulin since 2005.  GDM: never.  DKA: never. Severe hypoglycemia: never.   Pancreatitis: never.  Other: she chose bid premixed insulin.   Interval history: She seldom has hypoglycemia, and these episodes are mild. This happens when she misses a meal.  pt states she feels well in general, except for intermitt.  Anti-pain gel helps knee pain.  Past Medical History:  Diagnosis Date  . Allergy    SEASONAL  . ANXIETY 03/08/2008  . Cataract    REMOVED BILATERAL  . Depression   . DIABETES MELLITUS, TYPE I 02/23/2007  . DIVERTICULOSIS, COLON 03/08/2008  . DVT (deep venous thrombosis) ([place])   . GERD 02/23/2007  . HYPERCHOLESTEROLEMIA 02/01/2008  . HYPERTENSION 03/08/2008  . OSTEOARTHRITIS 02/23/2007  . OSTEOPOROSIS 02/23/2007  . PERIPHERAL NEUROPATHY 02/23/2007  . Stomach cancer ([place]) 07/20/14   invasive adenocarcinoma w/signet rings features  . Urolithiasis     Past Surgical History:  Procedure Laterality Date  . CHOLECYSTECTOMY  1995  . ESOPHAGOGASTRODUODENOSCOPY  06/08/2002    Social History   Socioeconomic History  . Marital status: Married    Spouse name: Not on file  . Number of children: Not on file  . Years of education: Not on file  . Highest education level: Not on file  Social Needs  . Financial resource strain: Not on file  . Food insecurity - worry: Not on file  . Food insecurity - inability: Not on file  . Transportation needs - medical: Not on file  . Transportation needs - non-medical: Not on file  Occupational History  . Occupation: Retired    Fish farm manager: RETIRED  Tobacco Use  . Smoking status: Never Smoker  . Smokeless tobacco: Never Used  Substance and Sexual Activity  . Alcohol use: No    Alcohol/week: 0.0 oz    . Drug use: No  . Sexual activity: Not on file  Other Topics Concern  . Not on file  Social History Narrative   Dentist       Current Outpatient Medications on File Prior to Visit  Medication Sig Dispense Refill  . clopidogrel (PLAVIX) 75 MG tablet Take 1 tablet (75 mg total) by mouth daily. 90 tablet 1  . diclofenac sodium (VOLTAREN) 1 % GEL Apply 4 g topically 4 (four) times daily. For knee pain 100 g 11  . esomeprazole (NEXIUM) 40 MG capsule TAKE 1 CAPSULE DAILY AT    NOON 30 capsule 8  . glucose blood (BAYER CONTOUR TEST) test strip USE ONE STRIP TO CHECK GLUCOSE THREE TIMES DAILY dx code: E11.9 200 each 5  . insulin aspart protamine - aspart (NOVOLOG MIX 70/30 FLEXPEN) (70-30) 100 UNIT/ML FlexPen 20 units with breakfast, and 8 units with supper, and pen needles 2/day 30 mL 11  . Insulin Pen Needle (CAREFINE PEN NEEDLES) 32G X 5 MM MISC Use to inject insulin 2 times per day. 200 each 2  . metoprolol tartrate (LOPRESSOR) 25 MG tablet TAKE 1 TABLET TWICE A DAY 180 tablet 1  . mirtazapine (REMERON) 15 MG tablet TAKE 1 TABLET BY MOUTH EACH NIGHT AT BEDTIME IF NEEDED 30 tablet 11  . nitroGLYCERIN (NITROSTAT) 0.4 MG SL tablet Place 1 tablet (0.4 mg total) under the tongue  every 5 (five) minutes x 3 doses as needed for chest pain. 30 tablet 12  . NOVOLOG MIX 70/30 FLEXPEN (70-30) 100 UNIT/ML FlexPen INJECT 25 UNITS WITH       BREAKFAST, AND 10 UNITS    WITH SUPPER. 30 mL 4  . traMADol (ULTRAM) 50 MG tablet Take 1 tablet (50 mg total) by mouth every 6 (six) hours as needed. 50 tablet 0   No current facility-administered medications on file prior to visit.     Allergies  Allergen Reactions  . Pirfenidone Diarrhea and Nausea And Vomiting  . Aspirin     rash    Family History  Problem Relation Age of Onset  . Cancer Father        Prostate Cancer  . Cancer Sister        Breast Cancer  . Diabetes Sister   . Diabetes Sister   . Other Mother        pneumonia   .  Cancer Sister        breast cancer     BP 122/64 (BP Location: Right Arm, Patient Position: Sitting, Cuff Size: Normal)   Pulse 62   Wt 125 lb 12.8 oz (57.1 kg)   SpO2 93%   BMI 24.57 kg/m   Review of Systems No weight change.     Objective:   Physical Exam VITAL SIGNS:  See vs page GENERAL: no distress Pulses: foot pulses are intact bilaterally.   MSK: no deformity of the feet or ankles.  CV: no edema of the legs or ankles Skin:  no ulcer on the feet or ankles.  normal color and temp on the feet and ankles Neuro: sensation is intact to touch on the feet and ankles.      Lab Results  Component Value Date   HGBA1C 7.3 05/27/2017      Assessment & Plan:  Insulin-requiring type 2 DM: this is the best control this pt should aim for, given this regimen, which does match insulin to her changing needs throughout the day Knee pain: well-controlled  Patient Instructions  check your blood sugar twice a day.  vary the time of day when you check, between before the 3 meals, and at bedtime.  also check if you have symptoms of your blood sugar being too high or too low.  please keep a record of the readings and bring it to your next appointment here (or you can bring the meter itself).  You can write it on any piece of paper.  please call us sooner if your blood sugar goes below 70, or if you have a lot of readings over 200.  Please continue the same insulin: 20 units with breakfast, and 8 units with supper.   Please see Dr Benay Spice soon, to follow up the cancer: [phone] Please come back for a follow-up appointment in 3 months.

## 2017-06-12 ENCOUNTER — Other Ambulatory Visit: Payer: Self-pay | Admitting: Endocrinology

## 2017-06-12 MED ORDER — ATORVASTATIN CALCIUM 10 MG PO TABS: 10.0000 mg | ORAL_TABLET | Freq: Every day | ORAL | 11 refills | Status: DC

## 2017-07-16 ENCOUNTER — Telehealth: Payer: Self-pay

## 2017-07-16 NOTE — Telephone Encounter (Signed)
D/c lipitor, as you may not need it.  We'll recheck blood tests next time.

## 2017-07-16 NOTE — Telephone Encounter (Signed)
Patient called and she stated she can not take the lipitor that was prescribed and is requesting the doctor call her in something else

## 2017-07-17 NOTE — Telephone Encounter (Signed)
I called and left patient VM that she could d/c lipitor. I stated that if she had further questions she could call our office.

## 2017-08-04 ENCOUNTER — Telehealth: Payer: Self-pay | Admitting: Endocrinology

## 2017-08-04 NOTE — Telephone Encounter (Signed)
Pt is scheduled °

## 2017-08-04 NOTE — Telephone Encounter (Signed)
I called patient & she has agreed to come in tomorrow 1:45. I will have Colletta [place] add her to schedule.

## 2017-08-04 NOTE — Telephone Encounter (Signed)
[facility] near you, or: 1:45 PM tomorrow

## 2017-08-04 NOTE — Telephone Encounter (Signed)
Pt stated she has a sore throat and would like to know if there is anything the dr can call in for her.   Please advise

## 2017-08-05 ENCOUNTER — Encounter: Payer: Self-pay | Admitting: Endocrinology

## 2017-08-05 ENCOUNTER — Ambulatory Visit (INDEPENDENT_AMBULATORY_CARE_PROVIDER_SITE_OTHER): Payer: Medicare Other | Admitting: Endocrinology

## 2017-08-05 VITALS — BP 136/72 | HR 59 | Wt 125.8 lb

## 2017-08-05 DIAGNOSIS — IMO0001 Reserved for inherently not codable concepts without codable children: Secondary | ICD-10-CM

## 2017-08-05 DIAGNOSIS — E119 Type 2 diabetes mellitus without complications: Secondary | ICD-10-CM

## 2017-08-05 DIAGNOSIS — Z794 Long term (current) use of insulin: Secondary | ICD-10-CM | POA: Diagnosis not present

## 2017-08-05 LAB — POCT GLYCOSYLATED HEMOGLOBIN (HGB A1C): Hemoglobin A1C: 7.7

## 2017-08-05 MED ORDER — CEPHALEXIN 250 MG PO CAPS: 250.0000 mg | ORAL_CAPSULE | Freq: Three times a day (TID) | ORAL | 0 refills | Status: DC

## 2017-08-05 NOTE — Progress Notes (Signed)
Subjective:    Patient ID: Tammy Hughes, female    DOB: [birth year], 84 y.o.   MRN: [id number]  HPI Pt returns for f/u of diabetes mellitus:  DM type: Insulin-requiring type 2.  Dx'ed: 5035.   Complications: polyneuropathy and CAD.  Therapy: insulin since 2005.  GDM: never.  DKA: never. Severe hypoglycemia: never.   Pancreatitis: never.  Other: she chose bid premixed insulin.   Interval history: no cbg record, but states cbg's are well-controlled.    Pt states 1 week of moderate pain at the the back of the tongue, but no assoc fever.   Past Medical History:  Diagnosis Date  . Allergy    SEASONAL  . ANXIETY 03/08/2008  . Cataract    REMOVED BILATERAL  . Depression   . DIABETES MELLITUS, TYPE I 02/23/2007  . DIVERTICULOSIS, COLON 03/08/2008  . DVT (deep venous thrombosis) ([place])   . GERD 02/23/2007  . HYPERCHOLESTEROLEMIA 02/01/2008  . HYPERTENSION 03/08/2008  . OSTEOARTHRITIS 02/23/2007  . OSTEOPOROSIS 02/23/2007  . PERIPHERAL NEUROPATHY 02/23/2007  . Stomach cancer ([place]) 07/20/14   invasive adenocarcinoma w/signet rings features  . Urolithiasis     Past Surgical History:  Procedure Laterality Date  . CARDIAC CATHETERIZATION N/A 02/02/2016   Procedure: Left Heart Cath and Coronary Angiography;  Surgeon: Peter M Martinique, MD;  Location: [facility];  Service: Cardiovascular;  Laterality: N/A;  . CHOLECYSTECTOMY  1995  . ESOPHAGOGASTRODUODENOSCOPY  06/08/2002  . GASTRECTOMY N/A 07/20/2014   Procedure: PROXIMAL GASTRECTOMY;  Surgeon: Stark Klein, MD;  Location: [place];  Service: General;  Laterality: N/A;  . GASTROJEJUNOSTOMY N/A 07/20/2014   Procedure: GASTROJEJUNOSTOMY;  Surgeon: Stark Klein, MD;  Location: [facility];  Service: General;  Laterality: N/A;  . LAPAROSCOPY N/A 07/20/2014   Procedure: LAPAROSCOPY DIAGNOSTIC;  Surgeon: Stark Klein, MD;  Location: [place];  Service: General;  Laterality: N/A;    Social History   Socioeconomic History  . Marital status: Married   Spouse name: Not on file  . Number of children: Not on file  . Years of education: Not on file  . Highest education level: Not on file  Social Needs  . Financial resource strain: Not on file  . Food insecurity - worry: Not on file  . Food insecurity - inability: Not on file  . Transportation needs - medical: Not on file  . Transportation needs - non-medical: Not on file  Occupational History  . Occupation: Retired    Fish farm manager: RETIRED  Tobacco Use  . Smoking status: Never Smoker  . Smokeless tobacco: Never Used  Substance and Sexual Activity  . Alcohol use: No    Alcohol/week: 0.0 oz  . Drug use: No  . Sexual activity: Not on file  Other Topics Concern  . Not on file  Social History Narrative   Dentist       Current Outpatient Medications on File Prior to Visit  Medication Sig Dispense Refill  . atorvastatin (LIPITOR) 10 MG tablet Take 1 tablet (10 mg total) by mouth daily. 30 tablet 11  . clopidogrel (PLAVIX) 75 MG tablet Take 1 tablet (75 mg total) by mouth daily. 90 tablet 1  . diclofenac sodium (VOLTAREN) 1 % GEL Apply 4 g topically 4 (four) times daily. For knee pain 100 g 11  . esomeprazole (NEXIUM) 40 MG capsule TAKE 1 CAPSULE DAILY AT    NOON 30 capsule 8  . glucose blood (BAYER CONTOUR TEST) test strip USE ONE STRIP TO CHECK  GLUCOSE THREE TIMES DAILY dx code: E11.9 200 each 5  . insulin aspart protamine - aspart (NOVOLOG MIX 70/30 FLEXPEN) (70-30) 100 UNIT/ML FlexPen 20 units with breakfast, and 8 units with supper, and pen needles 2/day 30 mL 11  . Insulin Pen Needle (CAREFINE PEN NEEDLES) 32G X 5 MM MISC Use to inject insulin 2 times per day. 200 each 2  . metoprolol tartrate (LOPRESSOR) 25 MG tablet TAKE 1 TABLET TWICE A DAY 180 tablet 1  . mirtazapine (REMERON) 15 MG tablet TAKE 1 TABLET BY MOUTH EACH NIGHT AT BEDTIME IF NEEDED 30 tablet 11  . nitroGLYCERIN (NITROSTAT) 0.4 MG SL tablet Place 1 tablet (0.4 mg total) under the tongue every 5 (five)  minutes x 3 doses as needed for chest pain. 30 tablet 12  . NOVOLOG MIX 70/30 FLEXPEN (70-30) 100 UNIT/ML FlexPen INJECT 25 UNITS WITH       BREAKFAST, AND 10 UNITS    WITH SUPPER. 30 mL 4  . traMADol (ULTRAM) 50 MG tablet Take 1 tablet (50 mg total) by mouth every 6 (six) hours as needed. 50 tablet 0   No current facility-administered medications on file prior to visit.     Allergies  Allergen Reactions  . Pirfenidone Diarrhea and Nausea And Vomiting  . Aspirin     rash    Family History  Problem Relation Age of Onset  . Cancer Father        Prostate Cancer  . Cancer Sister        Breast Cancer  . Diabetes Sister   . Diabetes Sister   . Other Mother        pneumonia   . Cancer Sister        breast cancer     BP 136/72 (BP Location: Left Arm, Patient Position: Sitting, Cuff Size: Normal)   Pulse (!) 59   Wt 125 lb 12.8 oz (57.1 kg)   SpO2 95%   BMI 24.57 kg/m   Review of Systems Denies nasal congestion.  Denies cough and earache.      Objective:   Physical Exam VITAL SIGNS:  See vs page GENERAL: no distress Both eac's and tm's are normal head: no deformity  eyes: no periorbital swelling, no proptosis  external nose and ears are normal  mouth: no lesion seen Pulses: dorsalis pedis intact bilat.   MSK: no deformity of the feet CV: no leg edema Skin:  no ulcer on the feet.  normal color and temp on the feet. Neuro: sensation is intact to touch on the feet  Lab Results  Component Value Date   HGBA1C 7.7 08/05/2017       Assessment & Plan:  Insulin-requiring type 2 DM, with CAD: this is the best control this pt should aim for, given this regimen, which does match insulin to her changing needs throughout the day. Sore throat, new, uncertain etiology.   Patient Instructions  Please continue the same insulin. I have sent a prescription to your pharmacy, for an antibiotic pill. I hope you feel better soon.  If you don't feel better in a few days, please call  back.  If so, I'll prescribe for you a pill against "thrush."  Please call sooner if you get worse. Please come back for a follow-up appointment in 4 months.

## 2017-08-05 NOTE — Patient Instructions (Addendum)
Please continue the same insulin. I have sent a prescription to your pharmacy, for an antibiotic pill. I hope you feel better soon.  If you don't feel better in a few days, please call back.  If so, I'll prescribe for you a pill against "thrush."  Please call sooner if you get worse. Please come back for a follow-up appointment in 4 months.

## 2017-08-27 ENCOUNTER — Telehealth: Payer: Self-pay | Admitting: Nurse Practitioner

## 2017-08-27 ENCOUNTER — Ambulatory Visit: Payer: Medicare Other | Admitting: Endocrinology

## 2017-08-27 ENCOUNTER — Encounter: Payer: Self-pay | Admitting: Nurse Practitioner

## 2017-08-27 ENCOUNTER — Inpatient Hospital Stay: Payer: Medicare Other | Attending: Nurse Practitioner | Admitting: Nurse Practitioner

## 2017-08-27 VITALS — BP 141/66 | HR 63 | Temp 97.8°F | Resp 18 | Ht 60.0 in | Wt 125.9 lb

## 2017-08-27 DIAGNOSIS — C169 Malignant neoplasm of stomach, unspecified: Secondary | ICD-10-CM

## 2017-08-27 DIAGNOSIS — C165 Malignant neoplasm of lesser curvature of stomach, unspecified: Secondary | ICD-10-CM | POA: Diagnosis not present

## 2017-08-27 DIAGNOSIS — E119 Type 2 diabetes mellitus without complications: Secondary | ICD-10-CM | POA: Diagnosis not present

## 2017-08-27 NOTE — Telephone Encounter (Signed)
Scheduled appt per 2/13 los - Gave patient AVS and calender per los,.

## 2017-08-27 NOTE — Progress Notes (Addendum)
  Tammy Hughes OFFICE PROGRESS NOTE   Diagnosis: Gastric cancer  INTERVAL HISTORY:   Tammy Hughes was last seen at the [facility] 10/24/2016.  She did not keep a 46-month follow-up visit.  No complaints.  She denies pain.  She has a good appetite.  She reports her weight is stable.  No dysphagia.  No nausea or vomiting.  Objective:  Vital signs in last 24 hours:  Blood pressure (!) 141/66, pulse 63, temperature 97.8 F (36.6 C), temperature source Oral, resp. rate 18, height 5' (1.524 m), weight 125 lb 14.4 oz (57.1 kg), SpO2 98 %.    HEENT: Neck without mass. Lymphatics: No palpable cervical, supraclavicular, axillary or inguinal lymph nodes. Resp: Lungs clear bilaterally. Cardio: Regular rate and rhythm. GI: Abdomen soft and nontender.  No hepatosplenomegaly.  No mass. Vascular: No leg edema.   Lab Results:  Lab Results  Component Value Date   WBC 6.0 11/24/2016   HGB 12.1 11/24/2016   HCT 36.8 11/24/2016   MCV 86.8 11/24/2016   PLT 284 11/24/2016   NEUTROABS 2.3 08/02/2015    Imaging:  No results found.  Medications: I have reviewed the patient's current medications.  Assessment/Plan: 1. Gastric cancer, status post an endoscopic biopsy of a lesser curvature mass on 05/31/2014 confirming adenocarcinoma ? Staging CTs of the chest and abdomen on 06/08/2014 revealed no evidence of metastatic disease ? Proximal gastrectomy and feeding jejunostomy 07/20/2014, pT2,pN1 ? Adjuvant weekly 5-Fu/leucovorin 09/14/2014, 09/21/2014, 09/28/2014 and 10/05/2014 ? Initiation of radiation and infusional 5-FU 10/17/2014 ? 5-fluorouracil pump discontinued on 10/27/2014 due to toxicity. ? Radiation discontinued 11/02/2014  2. Pulmonary fibrosis 3. Esophageal stricture, status post a dilatation procedure 05/31/2014 4. Diabetes 5. Postoperative ventricle tachycardia, NSTEMI January 2016 6. Hospital-acquired pneumonia January 2016 7. CT abdomen/pelvis 09/02/2014 with  postsurgical changes. Focal fluid collection in the midline abutting the distal greater curvature of the stomach measuring 2.2 x 2.9 x 3.4 cm. Mild stranding in the adjacent fat. 8. PICC placement 10/17/2014 9. Rash. Most likely related to the 5-fluorouracil. Resolved. 10. Right upper extremity DVT 10/25/2014. She completed a course of Xarelto. 11. Hospitalization 10/27/2014 through 10/28/2014 nausea/vomiting and left arm discomfort. Noted to have skin toxicity from the 5-fluorouracil. The 5-fluorouracil was discontinued. 12. Depression. She continues Remeron.    Disposition: Tammy Hughes remains in clinical remission from gastric cancer.  She will return for a follow-up visit in 8 months.  She will contact the office in the interim with any problems.  Patient seen with Dr. Benay Spice.    Tammy Hughes ANP/GNP-BC   08/27/2017  2:03 PM  This was a shared visit with Tammy Hughes.  Tammy Hughes is in remission from gastric cancer.  She will return for an office visit in 8 months.  Tammy Hughes, Tammy Hughes

## 2017-09-16 ENCOUNTER — Telehealth: Payer: Self-pay | Admitting: Endocrinology

## 2017-09-16 ENCOUNTER — Other Ambulatory Visit: Payer: Self-pay

## 2017-09-16 MED ORDER — CLOPIDOGREL BISULFATE 75 MG PO TABS: 75.0000 mg | ORAL_TABLET | Freq: Every day | ORAL | 1 refills | Status: DC

## 2017-09-16 MED ORDER — METOPROLOL TARTRATE 25 MG PO TABS: 25.0000 mg | ORAL_TABLET | Freq: Two times a day (BID) | ORAL | 1 refills | Status: DC

## 2017-09-16 NOTE — Telephone Encounter (Signed)
Patient needs RX sent in for Clopidogrel 75 mg  AND a RX for Metoprolol Tartrate 25 mg sent to (pharmacy told pt Dr needs to call in script to [place] (pt told Dr must send in request for refill)

## 2017-09-16 NOTE — Telephone Encounter (Signed)
I have sent both to [facility].

## 2017-10-07 ENCOUNTER — Other Ambulatory Visit: Payer: Self-pay | Admitting: Endocrinology

## 2017-10-08 ENCOUNTER — Telehealth: Payer: Self-pay | Admitting: Endocrinology

## 2017-10-08 ENCOUNTER — Other Ambulatory Visit: Payer: Self-pay

## 2017-10-08 MED ORDER — ONETOUCH VERIO W/DEVICE KIT: 1.0000 | PACK | Freq: Every day | 0 refills | Status: DC

## 2017-10-08 MED ORDER — GLUCOSE BLOOD VI STRP: ORAL_STRIP | 12 refills | Status: DC

## 2017-10-08 NOTE — Telephone Encounter (Signed)
I have sent to patient;'s pharmacy.  

## 2017-10-08 NOTE — Telephone Encounter (Signed)
glucose blood (BAYER CONTOUR TEST) test strip       [employer] insurance will not cover the above prescription. States that patient would like theOne touch verio And would like [place] to send in a prescription for this. Patient would also need new meter and strips.   Please advise   [place], [place]

## 2017-10-09 ENCOUNTER — Other Ambulatory Visit: Payer: Self-pay

## 2017-10-09 ENCOUNTER — Telehealth: Payer: Self-pay | Admitting: Endocrinology

## 2017-10-09 MED ORDER — GLUCOSE BLOOD VI STRP: ORAL_STRIP | 12 refills | Status: DC

## 2017-10-09 NOTE — Telephone Encounter (Signed)
I have sent with DX code to patient's pharmacy [facility].

## 2017-10-09 NOTE — Telephone Encounter (Signed)
Patient is waiting at the pharmacy for her test strips-script has been sent but without a diagnosis code in order for Medicare to pay. Please send script with diagnoses coda asap to [facility] in [place], [place]

## 2017-10-10 ENCOUNTER — Other Ambulatory Visit: Payer: Self-pay

## 2017-11-21 ENCOUNTER — Encounter

## 2017-11-21 ENCOUNTER — Encounter: Payer: Self-pay | Admitting: Endocrinology

## 2017-11-21 ENCOUNTER — Ambulatory Visit (INDEPENDENT_AMBULATORY_CARE_PROVIDER_SITE_OTHER): Payer: Medicare Other | Admitting: Endocrinology

## 2017-11-21 VITALS — BP 148/68 | HR 58 | Wt 126.8 lb

## 2017-11-21 DIAGNOSIS — Z0000 Encounter for general adult medical examination without abnormal findings: Secondary | ICD-10-CM | POA: Diagnosis not present

## 2017-11-21 DIAGNOSIS — R413 Other amnesia: Secondary | ICD-10-CM | POA: Diagnosis not present

## 2017-11-21 DIAGNOSIS — Z23 Encounter for immunization: Secondary | ICD-10-CM | POA: Diagnosis not present

## 2017-11-21 DIAGNOSIS — E119 Type 2 diabetes mellitus without complications: Secondary | ICD-10-CM

## 2017-11-21 DIAGNOSIS — Z794 Long term (current) use of insulin: Secondary | ICD-10-CM

## 2017-11-21 DIAGNOSIS — IMO0001 Reserved for inherently not codable concepts without codable children: Secondary | ICD-10-CM

## 2017-11-21 DIAGNOSIS — M810 Age-related osteoporosis without current pathological fracture: Secondary | ICD-10-CM

## 2017-11-21 DIAGNOSIS — C169 Malignant neoplasm of stomach, unspecified: Secondary | ICD-10-CM

## 2017-11-21 LAB — BASIC METABOLIC PANEL
BUN: 19 mg/dL (ref 6–23)
CALCIUM: 10.1 mg/dL (ref 8.4–10.5)
CHLORIDE: 107 meq/L (ref 96–112)
CO2: 29 mEq/L (ref 19–32)
CREATININE: 0.85 mg/dL (ref 0.40–1.20)
GFR: 82.22 mL/min (ref >60.00)
Glucose, Bld: 106 mg/dL — ABNORMAL HIGH (ref 70–99)
Potassium: 4.1 mEq/L (ref 3.5–5.1)
Sodium: 142 mEq/L (ref 135–145)

## 2017-11-21 LAB — HEPATIC FUNCTION PANEL
ALT: 9 U/L (ref 0–35)
AST: 17 U/L (ref 0–37)
Albumin: 3.7 g/dL (ref 3.5–5.2)
Alkaline Phosphatase: 82 U/L (ref 39–117)
BILIRUBIN DIRECT: 0 mg/dL (ref 0.0–0.3)
BILIRUBIN TOTAL: 0.2 mg/dL (ref 0.2–1.2)
Total Protein: 8.1 g/dL (ref 6.0–8.3)

## 2017-11-21 LAB — URINALYSIS, ROUTINE W REFLEX MICROSCOPIC
BILIRUBIN URINE: NEGATIVE
HGB URINE DIPSTICK: NEGATIVE
KETONES UR: NEGATIVE
NITRITE: NEGATIVE
RBC / HPF: NONE SEEN (ref 0–?)
Specific Gravity, Urine: 1.02 (ref 1.000–1.030)
URINE GLUCOSE: NEGATIVE
UROBILINOGEN UA: 0.2 (ref 0.0–1.0)
pH: 5.5 (ref 5.0–8.0)

## 2017-11-21 LAB — CBC WITH DIFFERENTIAL/PLATELET
BASOS ABS: 0 10*3/uL (ref 0.0–0.1)
Basophils Relative: 0.5 % (ref 0.0–3.0)
EOS ABS: 0.1 10*3/uL (ref 0.0–0.7)
Eosinophils Relative: 1 % (ref 0.0–5.0)
HCT: 36.5 % (ref 36.0–46.0)
HEMOGLOBIN: 12 g/dL (ref 12.0–15.0)
LYMPHS ABS: 1.1 10*3/uL (ref 0.7–4.0)
Lymphocytes Relative: 18.4 % (ref 12.0–46.0)
MCHC: 32.9 g/dL (ref 30.0–36.0)
MCV: 90.2 fl (ref 78.0–100.0)
MONO ABS: 0.5 10*3/uL (ref 0.1–1.0)
Monocytes Relative: 8.6 % (ref 3.0–12.0)
NEUTROS PCT: 71.5 % (ref 43.0–77.0)
Neutro Abs: 4.3 10*3/uL (ref 1.4–7.7)
Platelets: 355 10*3/uL (ref 150.0–400.0)
RBC: 4.05 Mil/uL (ref 3.87–5.11)
RDW: 13.4 % (ref 11.5–15.5)
WBC: 6.1 10*3/uL (ref 4.0–10.5)

## 2017-11-21 LAB — LIPID PANEL
CHOL/HDL RATIO: 4
Cholesterol: 165 mg/dL (ref 0–200)
HDL: 38.8 mg/dL — ABNORMAL LOW (ref >39.00)
LDL CALC: 96 mg/dL (ref 0–99)
NonHDL: 126.28
TRIGLYCERIDES: 152 mg/dL — AB (ref 0.0–149.0)
VLDL: 30.4 mg/dL (ref 0.0–40.0)

## 2017-11-21 LAB — IBC PANEL
IRON: 56 ug/dL (ref 42–145)
SATURATION RATIOS: 20.7 % (ref 20.0–50.0)
Transferrin: 193 mg/dL — ABNORMAL LOW (ref 212.0–360.0)

## 2017-11-21 LAB — MICROALBUMIN / CREATININE URINE RATIO
CREATININE, U: 80.3 mg/dL
Microalb Creat Ratio: 13.4 mg/g (ref 0.0–30.0)
Microalb, Ur: 10.8 mg/dL — ABNORMAL HIGH (ref 0.0–1.9)

## 2017-11-21 LAB — TSH: TSH: 0.69 u[IU]/mL (ref 0.35–4.50)

## 2017-11-21 LAB — POCT GLYCOSYLATED HEMOGLOBIN (HGB A1C): Hemoglobin A1C: 7.5

## 2017-11-21 LAB — VITAMIN B12: VITAMIN B 12: 409 pg/mL (ref 211–911)

## 2017-11-21 NOTE — Patient Instructions (Addendum)
blood tests are requested for you today.  We'll let you know about the results. Please let know if you want to do the CT scan sooner than October.  Please come back for a follow-up appointment in 5 months. Please consider these measures for your health:  minimize alcohol.  Do not use tobacco products.  Have a colonoscopy at least every 10 years from age 84. Keep firearms safely stored.  Always use seat belts.  have working smoke alarms in your home.  See an eye doctor and dentist regularly.  Never drive under the influence of alcohol or drugs (including prescription drugs).     It is critically important to prevent falling down (keep floor areas well-lit, dry, and free of loose objects.  If you have a cane, walker, or wheelchair, you should use it, even for short trips around the house.  Wear flat-soled shoes.  Also, try not to rush)

## 2017-11-21 NOTE — Progress Notes (Signed)
Subjective:    Patient ID: Tammy Hughes, female    DOB: [birth year], 84 y.o.   MRN: [id number]  HPI Pt returns for f/u of diabetes mellitus:  DM type: Insulin-requiring type 2.  Dx'ed: 7829.   Complications: polyneuropathy and CAD.  Therapy: insulin since 2005.  GDM: never.  DKA: never. Severe hypoglycemia: never.   Pancreatitis: never.  Other: she chose bid premixed insulin.   Interval history: she brings a record of her cbg's which I have reviewed today.  It varies from 85-500.   Past Medical History:  Diagnosis Date  . Allergy    SEASONAL  . ANXIETY 03/08/2008  . Cataract    REMOVED BILATERAL  . Depression   . DIABETES MELLITUS, TYPE I 02/23/2007  . DIVERTICULOSIS, COLON 03/08/2008  . DVT (deep venous thrombosis) ([place])   . GERD 02/23/2007  . HYPERCHOLESTEROLEMIA 02/01/2008  . HYPERTENSION 03/08/2008  . OSTEOARTHRITIS 02/23/2007  . OSTEOPOROSIS 02/23/2007  . PERIPHERAL NEUROPATHY 02/23/2007  . Stomach cancer ([place]) 07/20/14   invasive adenocarcinoma w/signet rings features  . Urolithiasis     Past Surgical History:  Procedure Laterality Date  . CARDIAC CATHETERIZATION N/A 02/02/2016   Procedure: Left Heart Cath and Coronary Angiography;  Surgeon: Peter M Martinique, MD;  Location: [facility];  Service: Cardiovascular;  Laterality: N/A;  . CHOLECYSTECTOMY  1995  . ESOPHAGOGASTRODUODENOSCOPY  06/08/2002  . GASTRECTOMY N/A 07/20/2014   Procedure: PROXIMAL GASTRECTOMY;  Surgeon: Stark Klein, MD;  Location: [place];  Service: General;  Laterality: N/A;  . GASTROJEJUNOSTOMY N/A 07/20/2014   Procedure: GASTROJEJUNOSTOMY;  Surgeon: Stark Klein, MD;  Location: [place];  Service: General;  Laterality: N/A;  . LAPAROSCOPY N/A 07/20/2014   Procedure: LAPAROSCOPY DIAGNOSTIC;  Surgeon: Stark Klein, MD;  Location: [facility];  Service: General;  Laterality: N/A;    Social History   Socioeconomic History  . Marital status: Married    Spouse name: Not on file  . Number of children: Not on  file  . Years of education: Not on file  . Highest education level: Not on file  Occupational History  . Occupation: Retired    Fish farm manager: RETIRED  Social Needs  . Financial resource strain: Not on file  . Food insecurity:    Worry: Not on file    Inability: Not on file  . Transportation needs:    Medical: Not on file    Non-medical: Not on file  Tobacco Use  . Smoking status: Never Smoker  . Smokeless tobacco: Never Used  Substance and Sexual Activity  . Alcohol use: No    Alcohol/week: 0.0 oz  . Drug use: No  . Sexual activity: Not on file  Lifestyle  . Physical activity:    Days per week: Not on file    Minutes per session: Not on file  . Stress: Not on file  Relationships  . Social connections:    Talks on phone: Not on file    Gets together: Not on file    Attends religious service: Not on file    Active member of club or organization: Not on file    Attends meetings of clubs or organizations: Not on file    Relationship status: Not on file  . Intimate partner violence:    Fear of current or ex partner: Not on file    Emotionally abused: Not on file    Physically abused: Not on file    Forced sexual activity: Not on file  Other Topics  Concern  . Not on file  Social History Narrative   Dentist       Current Outpatient Medications on File Prior to Visit  Medication Sig Dispense Refill  . clopidogrel (PLAVIX) 75 MG tablet Take 1 tablet (75 mg total) by mouth daily. 90 tablet 1  . diclofenac sodium (VOLTAREN) 1 % GEL Apply 4 g topically 4 (four) times daily. For knee pain 100 g 11  . esomeprazole (NEXIUM) 40 MG capsule TAKE 1 CAPSULE DAILY AT    NOON 30 capsule 8  . glucose blood (ONETOUCH VERIO) test strip Used to check blood two sugars times daily.E11.9. 100 each 12  . insulin aspart protamine - aspart (NOVOLOG MIX 70/30 FLEXPEN) (70-30) 100 UNIT/ML FlexPen 20 units with breakfast, and 8 units with supper, and pen needles 2/day 30 mL 11  .  Insulin Pen Needle (CAREFINE PEN NEEDLES) 32G X 5 MM MISC Use to inject insulin 2 times per day. 200 each 2  . metoprolol tartrate (LOPRESSOR) 25 MG tablet Take 1 tablet (25 mg total) by mouth 2 (two) times daily. 180 tablet 1  . mirtazapine (REMERON) 15 MG tablet TAKE 1 TABLET BY MOUTH EACH NIGHT AT BEDTIME IF NEEDED 30 tablet 11   No current facility-administered medications on file prior to visit.     Allergies  Allergen Reactions  . Pirfenidone Diarrhea and Nausea And Vomiting  . Aspirin     rash    Family History  Problem Relation Age of Onset  . Cancer Father        Prostate Cancer  . Cancer Sister        Breast Cancer  . Diabetes Sister   . Diabetes Sister   . Other Mother        pneumonia   . Cancer Sister        breast cancer     BP (!) 148/68   Pulse (!) 58   Wt 126 lb 12.8 oz (57.5 kg)   SpO2 97%   BMI 24.76 kg/m   Review of Systems She has postprandial bloating and heartburn.     Objective:   Physical Exam VITAL SIGNS:  See vs page GENERAL: no distress ABDOMEN: abdomen is soft, nontender.  no hepatosplenomegaly.  not distended.  no hernia Pulses: dorsalis pedis intact bilat.   MSK: no deformity of the feet CV: no leg edema Skin:  no ulcer on the feet.  normal color and temp on the feet. Neuro: sensation is intact to touch on the feet   Lab Results  Component Value Date   HGBA1C 7.5 11/21/2017      Assessment & Plan:  Insulin-requiring type 2 DM: this is the best control this pt should aim for, given this regimen, which does match insulin to her changing needs throughout the day abd bloating and heartburn: I advised pt to go ahead and have her next CT now, rather that waiting fro Oct, but she declines.    Subjective:   Patient here for Medicare annual wellness visit and management of other chronic and acute problems.     Risk factors: advanced age    25 of Physicians Providing Medical Care to Patient:  See "snapshot"   Activities of  Daily Living: In your present state of health, do you have any difficulty performing the following activities (lives with husband)?:  Preparing food and eating?: No  Bathing yourself: No  Getting dressed: No  Using the toilet:No  Moving around from place to  place: No  In the past year have you fallen or had a near fall?:No    Home Safety: Has smoke detector and wears seat belts. No firearms.   Opioid Use: none   Diet and Exercise  Current exercise habits: pt says fair Dietary issues discussed: pt reports a healthy diet   Depression Screen  Q1: Over the past two weeks, have you felt down, depressed or hopeless? no  Q2: Over the past two weeks, have you felt little interest or pleasure in doing things? no   The following portions of the patient's history were reviewed and updated as appropriate: allergies, current medications, past family history, past medical history, past social history, past surgical history and problem list.   Review of Systems  Denies hearing loss, and visual loss Objective:   Vision:  Advertising account executive, so he declines VA today Hearing: grossly normal Body mass index:  See vs page Msk: pt easily and quickly performs "get-up-and-go" from a sitting position Cognitive Impairment Assessment: cognition, memory and judgment appear normal.  remembers 1/3 at 5 minutes.  excellent recall.  can easily read and write a sentence.  alert and oriented x 3   Assessment:   Medicare wellness utd on preventive parameters    Plan:   During the course of the visit the patient was educated and counseled about appropriate screening and preventive services including:        Fall prevention is advised today  Screening mammography is done by GYN Bone densitometry screening is ordered today Diabetes screening. Nutrition counseling is offered.   advanced directives/end of life addressed today:  see healthcare directives hyperlink  Vaccines are updated as needed  Patient  Instructions (the written plan) was given to the patient.

## 2017-11-21 NOTE — Progress Notes (Signed)
we discussed code status.  pt requests full code, but would not want to be started or maintained on artificial life-support measures if there was not a reasonable chance of recovery 

## 2017-11-24 ENCOUNTER — Telehealth: Payer: Self-pay | Admitting: Endocrinology

## 2017-11-24 NOTE — Telephone Encounter (Signed)
Please call patient at ph# [phone] to discuss results (returning call).

## 2017-11-24 NOTE — Telephone Encounter (Signed)
I returned patient's call, but phone only rang.

## 2017-12-03 ENCOUNTER — Ambulatory Visit: Payer: Medicare Other | Admitting: Endocrinology

## 2017-12-22 DIAGNOSIS — E119 Type 2 diabetes mellitus without complications: Secondary | ICD-10-CM | POA: Diagnosis not present

## 2017-12-22 DIAGNOSIS — H5213 Myopia, bilateral: Secondary | ICD-10-CM | POA: Diagnosis not present

## 2017-12-22 LAB — HM DIABETES EYE EXAM

## 2018-01-06 ENCOUNTER — Other Ambulatory Visit: Payer: Self-pay | Admitting: Endocrinology

## 2018-02-20 ENCOUNTER — Telehealth: Payer: Self-pay | Admitting: Endocrinology

## 2018-02-20 ENCOUNTER — Other Ambulatory Visit: Payer: Self-pay

## 2018-02-20 MED ORDER — INSULIN ASPART PROT & ASPART (70-30 MIX) 100 UNIT/ML PEN: PEN_INJECTOR | SUBCUTANEOUS | 99 refills | Status: DC

## 2018-02-20 NOTE — Telephone Encounter (Signed)
I have sent to pharmacy for patient.  

## 2018-02-20 NOTE — Telephone Encounter (Signed)
[facility] in [place] called requesting refill of Novolog 70/30. States patient was supposed to receive mail order but she hasn't received it yet.

## 2018-02-24 ENCOUNTER — Ambulatory Visit (INDEPENDENT_AMBULATORY_CARE_PROVIDER_SITE_OTHER): Payer: Medicare Other | Admitting: Endocrinology

## 2018-02-24 ENCOUNTER — Encounter: Payer: Self-pay | Admitting: Endocrinology

## 2018-02-24 VITALS — BP 123/62 | HR 55 | Ht 60.0 in | Wt 125.0 lb

## 2018-02-24 DIAGNOSIS — E119 Type 2 diabetes mellitus without complications: Secondary | ICD-10-CM

## 2018-02-24 DIAGNOSIS — Z794 Long term (current) use of insulin: Secondary | ICD-10-CM

## 2018-02-24 LAB — POCT GLYCOSYLATED HEMOGLOBIN (HGB A1C): Hemoglobin A1C: 7.1 % — AB (ref 4.0–5.6)

## 2018-02-24 NOTE — Patient Instructions (Addendum)
Please continue the same insulin.   Please call in a few days if the sore throat is not getting better.  blood tests are requested for you today.  We'll let you know about the results.  check your blood sugar twice a day.  vary the time of day when you check, between before the 3 meals, and at bedtime.  also check if you have symptoms of your blood sugar being too high or too low.  please keep a record of the readings and bring it to your next appointment here (or you can bring the meter itself).  You can write it on any piece of paper.  please call us sooner if your blood sugar goes below 70, or if you have a lot of readings over 200.

## 2018-02-24 NOTE — Progress Notes (Signed)
Subjective:    Patient ID: Tammy Hughes, female    DOB: [birth year], 84 y.o.   MRN: [id number]  HPI Pt returns for f/u of diabetes mellitus:  DM type: Insulin-requiring type 2.  Dx'ed: 8588.   Complications: polyneuropathy and CAD.  Therapy: insulin since 2005.  GDM: never.  DKA: never. Severe hypoglycemia: never.   Pancreatitis: never.  Other: she chose bid premixed insulin.   Interval history: she brings a record of her cbg's which I have reviewed today.  it varies from 143-400.  There is no trend throughout the day. Past Medical History:  Diagnosis Date  . Allergy    SEASONAL  . ANXIETY 03/08/2008  . Cataract    REMOVED BILATERAL  . Depression   . DIABETES MELLITUS, TYPE I 02/23/2007  . DIVERTICULOSIS, COLON 03/08/2008  . DVT (deep venous thrombosis) ([place])   . GERD 02/23/2007  . HYPERCHOLESTEROLEMIA 02/01/2008  . HYPERTENSION 03/08/2008  . OSTEOARTHRITIS 02/23/2007  . OSTEOPOROSIS 02/23/2007  . PERIPHERAL NEUROPATHY 02/23/2007  . Stomach cancer ([place]) 07/20/14   invasive adenocarcinoma w/signet rings features  . Urolithiasis     Past Surgical History:  Procedure Laterality Date  . CARDIAC CATHETERIZATION N/A 02/02/2016   Procedure: Left Heart Cath and Coronary Angiography;  Surgeon: Peter M Martinique, MD;  Location: [facility];  Service: Cardiovascular;  Laterality: N/A;  . CHOLECYSTECTOMY  1995  . ESOPHAGOGASTRODUODENOSCOPY  06/08/2002  . GASTRECTOMY N/A 07/20/2014   Procedure: PROXIMAL GASTRECTOMY;  Surgeon: Stark Klein, MD;  Location: [facility];  Service: General;  Laterality: N/A;  . GASTROJEJUNOSTOMY N/A 07/20/2014   Procedure: GASTROJEJUNOSTOMY;  Surgeon: Stark Klein, MD;  Location: [facility];  Service: General;  Laterality: N/A;  . LAPAROSCOPY N/A 07/20/2014   Procedure: LAPAROSCOPY DIAGNOSTIC;  Surgeon: Stark Klein, MD;  Location: [facility];  Service: General;  Laterality: N/A;    Social History   Socioeconomic History  . Marital status: Married    Spouse name: Not on  file  . Number of children: Not on file  . Years of education: Not on file  . Highest education level: Not on file  Occupational History  . Occupation: Retired    Fish farm manager: RETIRED  Social Needs  . Financial resource strain: Not on file  . Food insecurity:    Worry: Not on file    Inability: Not on file  . Transportation needs:    Medical: Not on file    Non-medical: Not on file  Tobacco Use  . Smoking status: Never Smoker  . Smokeless tobacco: Never Used  Substance and Sexual Activity  . Alcohol use: No    Alcohol/week: 0.0 standard drinks  . Drug use: No  . Sexual activity: Not on file  Lifestyle  . Physical activity:    Days per week: Not on file    Minutes per session: Not on file  . Stress: Not on file  Relationships  . Social connections:    Talks on phone: Not on file    Gets together: Not on file    Attends religious service: Not on file    Active member of club or organization: Not on file    Attends meetings of clubs or organizations: Not on file    Relationship status: Not on file  . Intimate partner violence:    Fear of current or ex partner: Not on file    Emotionally abused: Not on file    Physically abused: Not on file    Forced sexual  activity: Not on file  Other Topics Concern  . Not on file  Social History Narrative   Dentist       Current Outpatient Medications on File Prior to Visit  Medication Sig Dispense Refill  . clopidogrel (PLAVIX) 75 MG tablet Take 1 tablet (75 mg total) by mouth daily. 90 tablet 1  . diclofenac sodium (VOLTAREN) 1 % GEL Apply 4 g topically 4 (four) times daily. For knee pain 100 g 11  . esomeprazole (NEXIUM) 40 MG capsule TAKE 1 CAPSULE DAILY AT    NOON 30 capsule 8  . glucose blood (ONETOUCH VERIO) test strip Used to check blood two sugars times daily.E11.9. 100 each 12  . insulin aspart protamine - aspart (NOVOLOG MIX 70/30 FLEXPEN) (70-30) 100 UNIT/ML FlexPen 20 units with breakfast, and 8 units  with supper, and pen needles 2/day 30 mL prn  . metoprolol tartrate (LOPRESSOR) 25 MG tablet Take 1 tablet (25 mg total) by mouth 2 (two) times daily. 180 tablet 1  . mirtazapine (REMERON) 15 MG tablet TAKE 1 TABLET BY MOUTH EACH NIGHT AT BEDTIME IF NEEDED 30 tablet 11   No current facility-administered medications on file prior to visit.     Allergies  Allergen Reactions  . Pirfenidone Diarrhea and Nausea And Vomiting  . Aspirin     rash    Family History  Problem Relation Age of Onset  . Cancer Father        Prostate Cancer  . Cancer Sister        Breast Cancer  . Diabetes Sister   . Diabetes Sister   . Other Mother        pneumonia   . Cancer Sister        breast cancer     BP 123/62   Pulse (!) 55   Ht 5' (1.524 m)   Wt 125 lb (56.7 kg)   SpO2 95%   BMI 24.41 kg/m     Review of Systems No weight change.  She denies hypoglycemia.  She has sore throat x 1 day.      Objective:   Physical Exam VITAL SIGNS:  See vs page GENERAL: no distress head: no deformity  eyes: no periorbital swelling, no proptosis  external nose and ears are normal  mouth: no lesion seen Both eac's and tm's are normal Pulses: dorsalis pedis intact bilat.   MSK: no deformity of the feet CV: no leg edema Skin:  no ulcer on the feet.  normal color and temp on the feet.  Neuro: sensation is intact to touch on the feet.      Lab Results  Component Value Date   HGBA1C 7.1 (A) 02/24/2018       Assessment & Plan:  Insulin-requiring type 2 DM, with CAD: this is the best control this pt should aim for, given this regimen, which does match insulin to her changing needs throughout the day Sore throat, new, prob allergic.    Patient Instructions  Please continue the same insulin.   Please call in a few days if the sore throat is not getting better.  blood tests are requested for you today.  We'll let you know about the results.  check your blood sugar twice a day.  vary the time of day  when you check, between before the 3 meals, and at bedtime.  also check if you have symptoms of your blood sugar being too high or too low.  please keep a record  of the readings and bring it to your next appointment here (or you can bring the meter itself).  You can write it on any piece of paper.  please call us sooner if your blood sugar goes below 70, or if you have a lot of readings over 200.

## 2018-03-24 ENCOUNTER — Other Ambulatory Visit: Payer: Self-pay

## 2018-03-24 MED ORDER — CLOPIDOGREL BISULFATE 75 MG PO TABS: 75.0000 mg | ORAL_TABLET | Freq: Every day | ORAL | 1 refills | Status: DC

## 2018-03-24 MED ORDER — METOPROLOL TARTRATE 25 MG PO TABS: 25.0000 mg | ORAL_TABLET | Freq: Two times a day (BID) | ORAL | 1 refills | Status: DC

## 2018-04-24 ENCOUNTER — Encounter: Payer: Self-pay | Admitting: Endocrinology

## 2018-04-24 ENCOUNTER — Ambulatory Visit (INDEPENDENT_AMBULATORY_CARE_PROVIDER_SITE_OTHER): Payer: Medicare Other | Admitting: Endocrinology

## 2018-04-24 VITALS — BP 104/68 | HR 58 | Ht 60.0 in | Wt 127.2 lb

## 2018-04-24 DIAGNOSIS — Z23 Encounter for immunization: Secondary | ICD-10-CM

## 2018-04-24 DIAGNOSIS — Z0000 Encounter for general adult medical examination without abnormal findings: Secondary | ICD-10-CM

## 2018-04-24 DIAGNOSIS — I4729 Other ventricular tachycardia: Secondary | ICD-10-CM

## 2018-04-24 DIAGNOSIS — E119 Type 2 diabetes mellitus without complications: Secondary | ICD-10-CM | POA: Diagnosis not present

## 2018-04-24 DIAGNOSIS — Z794 Long term (current) use of insulin: Secondary | ICD-10-CM | POA: Diagnosis not present

## 2018-04-24 DIAGNOSIS — I472 Ventricular tachycardia: Secondary | ICD-10-CM

## 2018-04-24 LAB — POCT GLYCOSYLATED HEMOGLOBIN (HGB A1C): Hemoglobin A1C: 7.3 % — AB (ref 4.0–5.6)

## 2018-04-24 MED ORDER — CEFUROXIME AXETIL 250 MG PO TABS: 250.0000 mg | ORAL_TABLET | Freq: Two times a day (BID) | ORAL | 0 refills | Status: AC

## 2018-04-24 NOTE — Patient Instructions (Addendum)
Please see a heart specialist.  you will receive a phone call, about a day and time for an appointment.   Please consider these measures for your health:  minimize alcohol.  Do not use tobacco products.  Have a colonoscopy at least every 10 years from age 82.  Women should have an annual mammogram from age 8.  Keep firearms safely stored.  Always use seat belts.  have working smoke alarms in your home.  See an eye doctor and dentist regularly.  Never drive under the influence of alcohol or drugs (including prescription drugs).  Those with fair skin should take precautions against the sun, and should carefully examine their skin once per month, for any new or changed moles.   I have sent a prescription to your pharmacy, for an antibiotic pill.  I hope you feel better soon.  If you don't feel better by next week, please call back.  Please call sooner if you get worse.  Best wishes with your new primary care provider. Please come back for a follow-up appointment in 4 months, for the diabetes

## 2018-04-24 NOTE — Progress Notes (Signed)
Subjective:    Patient ID: Tammy Hughes, female    DOB: 05/16/1933, 84 y.o.   MRN: [id number]  HPI Pt is here for regular wellness examination, and is feeling pretty well in general, and says chronic med probs are stable, except as noted below Past Medical History:  Diagnosis Date  . Allergy    SEASONAL  . ANXIETY 03/08/2008  . Cataract    REMOVED BILATERAL  . Depression   . DIABETES MELLITUS, TYPE I 02/23/2007  . DIVERTICULOSIS, COLON 03/08/2008  . DVT (deep venous thrombosis) ([place])   . GERD 02/23/2007  . HYPERCHOLESTEROLEMIA 02/01/2008  . HYPERTENSION 03/08/2008  . OSTEOARTHRITIS 02/23/2007  . OSTEOPOROSIS 02/23/2007  . PERIPHERAL NEUROPATHY 02/23/2007  . Stomach cancer ([place]) 07/20/14   invasive adenocarcinoma w/signet rings features  . Urolithiasis     Past Surgical History:  Procedure Laterality Date  . CARDIAC CATHETERIZATION N/A 02/02/2016   Procedure: Left Heart Cath and Coronary Angiography;  Surgeon: Peter M Martinique, MD;  Location: [facility];  Service: Cardiovascular;  Laterality: N/A;  . CHOLECYSTECTOMY  1995  . ESOPHAGOGASTRODUODENOSCOPY  06/08/2002  . GASTRECTOMY N/A 07/20/2014   Procedure: PROXIMAL GASTRECTOMY;  Surgeon: Stark Klein, MD;  Location: [facility];  Service: General;  Laterality: N/A;  . GASTROJEJUNOSTOMY N/A 07/20/2014   Procedure: GASTROJEJUNOSTOMY;  Surgeon: Stark Klein, MD;  Location: [facility];  Service: General;  Laterality: N/A;  . LAPAROSCOPY N/A 07/20/2014   Procedure: LAPAROSCOPY DIAGNOSTIC;  Surgeon: Stark Klein, MD;  Location: [facility];  Service: General;  Laterality: N/A;    Social History   Socioeconomic History  . Marital status: Married    Spouse name: Not on file  . Number of children: Not on file  . Years of education: Not on file  . Highest education level: Not on file  Occupational History  . Occupation: Retired    Fish farm manager: RETIRED  Social Needs  . Financial resource strain: Not on file  . Food insecurity:    Worry: Not on file    Inability: Not on file  . Transportation needs:    Medical: Not on file    Non-medical: Not on file  Tobacco Use  . Smoking status: Never Smoker  . Smokeless tobacco: Never Used  Substance and Sexual Activity  . Alcohol use: No    Alcohol/week: 0.0 standard drinks  . Drug use: No  . Sexual activity: Not on file  Lifestyle  . Physical activity:    Days per week: Not on file    Minutes per session: Not on file  . Stress: Not on file  Relationships  . Social connections:    Talks on phone: Not on file    Gets together: Not on file    Attends religious service: Not on file    Active member of club or organization: Not on file    Attends meetings of clubs or organizations: Not on file    Relationship status: Not on file  . Intimate partner violence:    Fear of current or ex partner: Not on file    Emotionally abused: Not on file    Physically abused: Not on file    Forced sexual activity: Not on file  Other Topics Concern  . Not on file  Social History Narrative   Dentist       Current Outpatient Medications on File Prior to Visit  Medication Sig Dispense Refill  . clopidogrel (PLAVIX) 75 MG tablet Take 1 tablet (75 mg  total) by mouth daily. 90 tablet 1  . diclofenac sodium (VOLTAREN) 1 % GEL Apply 4 g topically 4 (four) times daily. For knee pain 100 g 11  . esomeprazole (NEXIUM) 40 MG capsule TAKE 1 CAPSULE DAILY AT    NOON 30 capsule 8  . glucose blood (ONETOUCH VERIO) test strip Used to check blood two sugars times daily.E11.9. 100 each 12  . insulin aspart protamine - aspart (NOVOLOG MIX 70/30 FLEXPEN) (70-30) 100 UNIT/ML FlexPen 20 units with breakfast, and 8 units with supper, and pen needles 2/day 30 mL prn  . metoprolol tartrate (LOPRESSOR) 25 MG tablet Take 1 tablet (25 mg total) by mouth 2 (two) times daily. 180 tablet 1  . mirtazapine (REMERON) 15 MG tablet TAKE 1 TABLET BY MOUTH EACH NIGHT AT BEDTIME IF NEEDED 30 tablet 11   No current  facility-administered medications on file prior to visit.     Allergies  Allergen Reactions  . Pirfenidone Diarrhea and Nausea And Vomiting  . Aspirin     rash    Family History  Problem Relation Age of Onset  . Cancer Father        Prostate Cancer  . Cancer Sister        Breast Cancer  . Diabetes Sister   . Diabetes Sister   . Other Mother        pneumonia   . Cancer Sister        breast cancer     BP 104/68 (BP Location: Left Arm, Patient Position: Sitting)   Pulse (!) 58   Ht 5' (1.524 m)   Wt 127 lb 3.2 oz (57.7 kg)   SpO2 97%   BMI 24.84 kg/m     Review of Systems Denies fever, diplopia, earache, chest pain, sob, back pain, depression, BRBPR, hematuria, syncope, numbness, allergy sxs, easy bruising, and rash.  She has fatigue and cold intolerance.     Objective:   Physical Exam VS: see vs page GEN: no distress NECK: supple, thyroid is not enlarged CHEST WALL: no deformity LUNGS: clear to auscultation CV: reg rate and rhythm, no murmur ABD: abdomen is soft, nontender.  no hepatosplenomegaly.  not distended.  no hernia.  Several old healed surgical scars MUSCULOSKELETAL: muscle bulk and strength are grossly normal.  no obvious joint swelling.  gait is normal and steady EXTEMITIES: no deformity.  no ulcer on the feet.  feet are of normal color and temp.  no edema PULSES: dorsalis pedis intact bilat.  no carotid bruit NEURO:  cn 2-12 grossly intact.   readily moves all 4's.  sensation is intact to touch on the feet SKIN:  Normal texture and temperature.  No rash or suspicious lesion is visible.   NODES:  None palpable at the neck PSYCH: alert, well-oriented.  Does not appear anxious nor depressed.        Assessment & Plan:  Wellness visit today, with problems stable, except as noted.  SEPARATE EVALUATION FOLLOWS--EACH PROBLEM HERE IS NEW, NOT RESPONDING TO TREATMENT, OR POSES SIGNIFICANT RISK TO THE PATIENT'S HEALTH:   HISTORY OF THE PRESENT ILLNESS: Pt  returns for f/u of diabetes mellitus:  DM type: Insulin-requiring type 2.  Dx'ed: 4742.   Complications: polyneuropathy and CAD.  Therapy: insulin since 2005.  GDM: never.  DKA: never. Severe hypoglycemia: never.   Pancreatitis: never.  Other: she chose bid premixed insulin.   Interval history: she brings a record of her cbg's which I have reviewed today.  it varies  from 143-400.  There is no trend throughout the day. She has bimaxillary pain x 1 week, but no assoc earache PAST MEDICAL HISTORY Past Medical History:  Diagnosis Date  . Allergy    SEASONAL  . ANXIETY 03/08/2008  . Cataract    REMOVED BILATERAL  . Depression   . DIABETES MELLITUS, TYPE I 02/23/2007  . DIVERTICULOSIS, COLON 03/08/2008  . DVT (deep venous thrombosis) ([place])   . GERD 02/23/2007  . HYPERCHOLESTEROLEMIA 02/01/2008  . HYPERTENSION 03/08/2008  . OSTEOARTHRITIS 02/23/2007  . OSTEOPOROSIS 02/23/2007  . PERIPHERAL NEUROPATHY 02/23/2007  . Stomach cancer ([employer]) 07/20/14   invasive adenocarcinoma w/signet rings features  . Urolithiasis     Past Surgical History:  Procedure Laterality Date  . CARDIAC CATHETERIZATION N/A 02/02/2016   Procedure: Left Heart Cath and Coronary Angiography;  Surgeon: Peter M Martinique, MD;  Location: [facility];  Service: Cardiovascular;  Laterality: N/A;  . CHOLECYSTECTOMY  1995  . ESOPHAGOGASTRODUODENOSCOPY  06/08/2002  . GASTRECTOMY N/A 07/20/2014   Procedure: PROXIMAL GASTRECTOMY;  Surgeon: Stark Klein, MD;  Location: [facility];  Service: General;  Laterality: N/A;  . GASTROJEJUNOSTOMY N/A 07/20/2014   Procedure: GASTROJEJUNOSTOMY;  Surgeon: Stark Klein, MD;  Location: [facility];  Service: General;  Laterality: N/A;  . LAPAROSCOPY N/A 07/20/2014   Procedure: LAPAROSCOPY DIAGNOSTIC;  Surgeon: Stark Klein, MD;  Location: [facility];  Service: General;  Laterality: N/A;    Social History   Socioeconomic History  . Marital status: Married    Spouse name: Not on file  . Number of children: Not  on file  . Years of education: Not on file  . Highest education level: Not on file  Occupational History  . Occupation: Retired    Fish farm manager: RETIRED  Social Needs  . Financial resource strain: Not on file  . Food insecurity:    Worry: Not on file    Inability: Not on file  . Transportation needs:    Medical: Not on file    Non-medical: Not on file  Tobacco Use  . Smoking status: Never Smoker  . Smokeless tobacco: Never Used  Substance and Sexual Activity  . Alcohol use: No    Alcohol/week: 0.0 standard drinks  . Drug use: No  . Sexual activity: Not on file  Lifestyle  . Physical activity:    Days per week: Not on file    Minutes per session: Not on file  . Stress: Not on file  Relationships  . Social connections:    Talks on phone: Not on file    Gets together: Not on file    Attends religious service: Not on file    Active member of club or organization: Not on file    Attends meetings of clubs or organizations: Not on file    Relationship status: Not on file  . Intimate partner violence:    Fear of current or ex partner: Not on file    Emotionally abused: Not on file    Physically abused: Not on file    Forced sexual activity: Not on file  Other Topics Concern  . Not on file  Social History Narrative   Dentist       Current Outpatient Medications on File Prior to Visit  Medication Sig Dispense Refill  . clopidogrel (PLAVIX) 75 MG tablet Take 1 tablet (75 mg total) by mouth daily. 90 tablet 1  . diclofenac sodium (VOLTAREN) 1 % GEL Apply 4 g topically 4 (four) times daily. For  knee pain 100 g 11  . esomeprazole (NEXIUM) 40 MG capsule TAKE 1 CAPSULE DAILY AT    NOON 30 capsule 8  . glucose blood (ONETOUCH VERIO) test strip Used to check blood two sugars times daily.E11.9. 100 each 12  . insulin aspart protamine - aspart (NOVOLOG MIX 70/30 FLEXPEN) (70-30) 100 UNIT/ML FlexPen 20 units with breakfast, and 8 units with supper, and pen needles 2/day  30 mL prn  . metoprolol tartrate (LOPRESSOR) 25 MG tablet Take 1 tablet (25 mg total) by mouth 2 (two) times daily. 180 tablet 1  . mirtazapine (REMERON) 15 MG tablet TAKE 1 TABLET BY MOUTH EACH NIGHT AT BEDTIME IF NEEDED 30 tablet 11   No current facility-administered medications on file prior to visit.     Allergies  Allergen Reactions  . Pirfenidone Diarrhea and Nausea And Vomiting  . Aspirin     rash    Family History  Problem Relation Age of Onset  . Cancer Father        Prostate Cancer  . Cancer Sister        Breast Cancer  . Diabetes Sister   . Diabetes Sister   . Other Mother        pneumonia   . Cancer Sister        breast cancer     BP 104/68 (BP Location: Left Arm, Patient Position: Sitting)   Pulse (!) 58   Ht 5' (1.524 m)   Wt 127 lb 3.2 oz (57.7 kg)   SpO2 97%   BMI 24.84 kg/m   REVIEW OF SYSTEMS: She denies hypoglycemia.  She has intermitt lightheadedness.  PHYSICAL EXAMINATION: head: no deformity  eyes: no periorbital swelling, no proptosis  external nose and ears are normal  mouth: no lesion seen Both eac's and tm's are normal VITAL SIGNS:  See vs page GENERAL: no distress HEART:  Regular rate and rhythm without murmurs noted. Normal S1,S2.   LAB/XRAY RESULTS: Lab Results  Component Value Date   HGBA1C 7.3 (A) 04/24/2018   I personally reviewed electrocardiogram tracing (today):  Indication: DM Impression: SB with 1D-AVB.  No MI.  No hypertrophy.  Compared to 2018: SB with 1D-AVB is new.   IMPRESSION: SB, new NSVT, by hx: in this setting, I hesitate to reduce b-blocker Insulin-requiring type 2 DM: this is the best control this pt should aim for, given this regimen, which does match insulin to her changing needs throughout the day Bimaxillary pain, new, uncertain etiology PLAN:  Please continue the same insulin Ref cardiol Please continue the same lopressor for now.  I rx'ed trial of ABX

## 2018-04-28 ENCOUNTER — Encounter: Payer: Self-pay | Admitting: Oncology

## 2018-04-28 ENCOUNTER — Ambulatory Visit: Payer: Medicare Other | Admitting: Endocrinology

## 2018-04-28 ENCOUNTER — Telehealth: Payer: Self-pay | Admitting: Oncology

## 2018-04-28 ENCOUNTER — Inpatient Hospital Stay: Payer: Medicare Other | Attending: Oncology | Admitting: Oncology

## 2018-04-28 VITALS — BP 154/63 | HR 51 | Temp 97.8°F | Resp 17 | Ht 60.0 in | Wt 125.2 lb

## 2018-04-28 DIAGNOSIS — C169 Malignant neoplasm of stomach, unspecified: Secondary | ICD-10-CM

## 2018-04-28 DIAGNOSIS — N6489 Other specified disorders of breast: Secondary | ICD-10-CM

## 2018-04-28 DIAGNOSIS — F329 Major depressive disorder, single episode, unspecified: Secondary | ICD-10-CM | POA: Diagnosis not present

## 2018-04-28 DIAGNOSIS — E119 Type 2 diabetes mellitus without complications: Secondary | ICD-10-CM | POA: Diagnosis not present

## 2018-04-28 DIAGNOSIS — C165 Malignant neoplasm of lesser curvature of stomach, unspecified: Secondary | ICD-10-CM | POA: Insufficient documentation

## 2018-04-28 DIAGNOSIS — R599 Enlarged lymph nodes, unspecified: Secondary | ICD-10-CM | POA: Insufficient documentation

## 2018-04-28 NOTE — Progress Notes (Signed)
[facility] OFFICE PROGRESS NOTE   Diagnosis: Gastric cancer  INTERVAL HISTORY:   Tammy Hughes returns as scheduled.  She is completing a course of antibiotics for a sinus infection.  She otherwise feels well.  Good appetite.  No other complaint.  Objective:  Vital signs in last 24 hours:  Blood pressure (!) 154/63, pulse (!) 51, temperature 97.8 F (36.6 C), temperature source Oral, resp. rate 17, height 5' (1.524 m), weight 125 lb 3.2 oz (56.8 kg), SpO2 99 %.    HEENT: Neck without mass Lymphatics: Cervical, supraclavicular, right axillary, or inguinal nodes.  1/2 cm mobile left lower node Left breast without a discrete mass, soft mobile 2 cm area of slight fullness in the tail of the upper left breast near the axilla, no discrete mass Resp: Lungs with bilateral inspiratory rales Cardio: Regular rate and rhythm GI: No hepatomegaly, no mass, nontender Vascular: No leg edema  Lab Results:  Lab Results  Component Value Date   WBC 6.1 11/21/2017   HGB 12.0 11/21/2017   HCT 36.5 11/21/2017   MCV 90.2 11/21/2017   PLT 355.0 11/21/2017   NEUTROABS 4.3 11/21/2017    CMP  Lab Results  Component Value Date   NA 142 11/21/2017   K 4.1 11/21/2017   CL 107 11/21/2017   CO2 29 11/21/2017   GLUCOSE 106 (H) 11/21/2017   BUN 19 11/21/2017   CREATININE 0.85 11/21/2017   CALCIUM 10.1 11/21/2017   PROT 8.1 11/21/2017   ALBUMIN 3.7 11/21/2017   AST 17 11/21/2017   ALT 9 11/21/2017   ALKPHOS 82 11/21/2017   BILITOT 0.2 11/21/2017   GFRNONAA 48 (L) 11/24/2016   GFRAA 56 (L) 11/24/2016    No results found for: CEA1   Medications: I have reviewed the patient's current medications.   Assessment/Plan: 1. Gastric cancer, status post an endoscopic biopsy of a lesser curvature mass on 05/31/2014 confirming adenocarcinoma ? Staging CTs of the chest and abdomen on 06/08/2014 revealed no evidence of metastatic disease ? Proximal gastrectomy and feeding jejunostomy  07/20/2014, pT2,pN1 ? Adjuvant weekly 5-Fu/leucovorin 09/14/2014, 09/21/2014, 09/28/2014 and 10/05/2014 ? Initiation of radiation and infusional 5-FU 10/17/2014 ? 5-fluorouracil pump discontinued on 10/27/2014 due to toxicity. ? Radiation discontinued 11/02/2014  2. Pulmonary fibrosis 3. Esophageal stricture, status post a dilatation procedure 05/31/2014 4. Diabetes 5. Postoperative ventricle tachycardia, NSTEMI January 2016 6. Hospital-acquired pneumonia January 2016 7. CT abdomen/pelvis 09/02/2014 with postsurgical changes. Focal fluid collection in the midline abutting the distal greater curvature of the stomach measuring 2.2 x 2.9 x 3.4 cm. Mild stranding in the adjacent fat. 8. PICC placement 10/17/2014 9. Rash. Most likely related to the 5-fluorouracil. Resolved. 10. Right upper extremity DVT 10/25/2014. She completed a course of Xarelto. 11. Hospitalization 10/27/2014 through 10/28/2014 nausea/vomiting and left arm discomfort. Noted to have skin toxicity from the 5-fluorouracil. The 5-fluorouracil was discontinued. 12. Depression. She continues Remeron.     Disposition: Tammy Hughes remains in clinical remission from gastric cancer.  The tiny lymph node and soft fullness at the tail of the left breast are likely benign findings.  She will return for reexamination in 4 months.  She reports that she no longer undergo screening mammography.  We will refer her for mammogram if there is a possible mass at the next visit.  15 minutes were spent with the patient today.  The majority of the time was used for counseling and coordination of care.  Tammy Coder, MD  04/28/2018  1:12 PM

## 2018-04-28 NOTE — Telephone Encounter (Signed)
Scheduled appt per 10/15 los- gave patient aVS and calender per los.   

## 2018-05-13 ENCOUNTER — Other Ambulatory Visit: Payer: Self-pay | Admitting: Endocrinology

## 2018-05-19 ENCOUNTER — Telehealth: Payer: Self-pay

## 2018-05-19 ENCOUNTER — Encounter: Payer: Self-pay | Admitting: Internal Medicine

## 2018-05-19 ENCOUNTER — Ambulatory Visit (INDEPENDENT_AMBULATORY_CARE_PROVIDER_SITE_OTHER): Payer: Medicare Other | Admitting: Internal Medicine

## 2018-05-19 VITALS — BP 148/66 | HR 54 | Ht 60.0 in | Wt 125.2 lb

## 2018-05-19 DIAGNOSIS — R001 Bradycardia, unspecified: Secondary | ICD-10-CM

## 2018-05-19 DIAGNOSIS — I472 Ventricular tachycardia: Secondary | ICD-10-CM | POA: Diagnosis not present

## 2018-05-19 DIAGNOSIS — R5383 Other fatigue: Secondary | ICD-10-CM

## 2018-05-19 DIAGNOSIS — I4729 Other ventricular tachycardia: Secondary | ICD-10-CM

## 2018-05-19 DIAGNOSIS — Z8673 Personal history of transient ischemic attack (TIA), and cerebral infarction without residual deficits: Secondary | ICD-10-CM | POA: Diagnosis not present

## 2018-05-19 DIAGNOSIS — I10 Essential (primary) hypertension: Secondary | ICD-10-CM

## 2018-05-19 NOTE — Telephone Encounter (Signed)
Called pt pcp Dr.Ellison as instructed by Dr.Acharya.  Dr.Acharya wanted to update Dr.Ellison on the medication changes she made at todays o/v. D/c Plavix, and D/C Metoprolol.  Pt has been on Plavix for a while and Dr.A would like to verify the indication.  lmom Dr.Ellison's office voicemail for someone there to return the call.

## 2018-05-19 NOTE — Patient Instructions (Signed)
Medication Instructions:  Your physician has recommended you make the following change in your medication:  1) STOP Plavix 2) STOP Metoprolol  If you need a refill on your cardiac medications before your next appointment, please call your pharmacy.   Lab work: None ordered  Testing/Procedures: None ordered  Follow-Up: At [facility], you and your health needs are our priority.  As part of our continuing mission to provide you with exceptional heart care, we have created designated Provider Care Teams.  These Care Teams include your primary Cardiologist (physician) and Advanced Practice Providers (APPs -  Physician Assistants and Nurse Practitioners) who all work together to provide you with the care you need, when you need it.  Your physician recommends that you schedule a follow-up appointment in: 4-6 weeks with Dr.Acharya  Any Other Special Instructions Will Be Listed Below (If Applicable).

## 2018-05-20 ENCOUNTER — Telehealth: Payer: Self-pay | Admitting: Internal Medicine

## 2018-05-20 MED ORDER — CLOPIDOGREL BISULFATE 75 MG PO TABS: 75.0000 mg | ORAL_TABLET | Freq: Every day | ORAL | Status: DC

## 2018-05-20 NOTE — Telephone Encounter (Signed)
Spoke with pt adv her of Dr.Acharya's recommendation to resume Plavix 75mg  daily.  Pt seemed a little confused about her medications. Asked the pt to gather her bottles to review together. Pt sts that she understands the instruction given and will resume Plavix 75mg  daily today.

## 2018-05-20 NOTE — Telephone Encounter (Signed)
Lmtcb. Dr.Acharya was trying to determine why the pt was still on Plavix. The pt has been seen by Dr.Hilty in the past. See  tel note below.  Tammy Casino, MD   10/25/14 4:47 PM  Note    She was placed on plavix per neurology for TIA/stroke. Not for CAD. I would advise she stop Plavix if she is going on xarelto for her upper extremity DVT.  Dr. Lemmie Evens     Per Dr.Acharya the pt is to resume Plavix 75mg  daily.

## 2018-05-20 NOTE — Telephone Encounter (Signed)
Patient called returning your call. Please call her back °

## 2018-05-21 NOTE — Telephone Encounter (Signed)
See 05/20/18 tel encounter

## 2018-05-26 ENCOUNTER — Encounter: Payer: Self-pay | Admitting: Internal Medicine

## 2018-05-26 NOTE — Progress Notes (Signed)
Cardiology Office Note:    Date:  05/29/2018   ID:  Tammy Hughes, DOB [birth year], MRN [id number]  PCP:  Renato Shin, MD  Cardiologist:  No primary care provider on file.  Electrophysiologist:  None   Referring MD: Renato Shin, MD   Sinus bradycardia and fatigue  History of Present Illness:    Tammy Hughes is a 84 y.o. female with a hx of diabetes requiring insulin, hyperlipidemia, and prior gastric adenocarcinoma, who presents today to evaluate sinus bradycardia. She also tells me that she has significant fatigue after taking her morning medicines.   She tells me that she overall is asymptomatic from a bradycardia standpoint, but about 20-30 minutes after she takes her morning medications which include Plavix and metoprolol, she needs to lie down and rest. She will nap or rest, and after ward she will resume her daily activities.   She has no significant chest pain, chest pressure, dyspnea at rest or with exertion, palpitations, PND, orthopnea, or leg swelling. Denies syncope or presyncope. Denies dizziness or lightheadedness.   She had a coronary angiogram in 2017 after an abnormal nuclear stress test that showed nonobstructive coronary artery disease.   During the post op period in 2016 after gastric cancer surgery she had an asymptomatic episode of NSVT 18 beats. She was initiated on metoprolol for arrhythmia suppression.   She has been continued on Plavix for a history of TIA. This was briefly stopped when she was initiated on rivaroxaban for upper extremity DVT in 2016 related to chemotherapy port.  Past Medical History:  Diagnosis Date  . Allergy    SEASONAL  . ANXIETY 03/08/2008  . Cataract    REMOVED BILATERAL  . Depression   . DIVERTICULOSIS, COLON 03/08/2008  . DVT (deep venous thrombosis) ([place])   . GERD 02/23/2007  . HYPERCHOLESTEROLEMIA 02/01/2008  . HYPERTENSION 03/08/2008  . Insulin dependent diabetes mellitus ([place])   . OSTEOARTHRITIS 02/23/2007  .  OSTEOPOROSIS 02/23/2007  . PERIPHERAL NEUROPATHY 02/23/2007  . Stomach cancer ([place]) 07/20/14   invasive adenocarcinoma w/signet rings features  . Urolithiasis     Past Surgical History:  Procedure Laterality Date  . CARDIAC CATHETERIZATION N/A 02/02/2016   Procedure: Left Heart Cath and Coronary Angiography;  Surgeon: Peter M Martinique, MD;  Location: [facility];  Service: Cardiovascular;  Laterality: N/A;  . CHOLECYSTECTOMY  1995  . ESOPHAGOGASTRODUODENOSCOPY  06/08/2002  . GASTRECTOMY N/A 07/20/2014   Procedure: PROXIMAL GASTRECTOMY;  Surgeon: Stark Klein, MD;  Location: [place];  Service: General;  Laterality: N/A;  . GASTROJEJUNOSTOMY N/A 07/20/2014   Procedure: GASTROJEJUNOSTOMY;  Surgeon: Stark Klein, MD;  Location: [facility];  Service: General;  Laterality: N/A;  . LAPAROSCOPY N/A 07/20/2014   Procedure: LAPAROSCOPY DIAGNOSTIC;  Surgeon: Stark Klein, MD;  Location: [facility];  Service: General;  Laterality: N/A;    Current Medications: Current Meds  Medication Sig  . diclofenac sodium (VOLTAREN) 1 % GEL Apply 4 g topically 4 (four) times daily. For knee pain  . esomeprazole (NEXIUM) 40 MG capsule TAKE 1 CAPSULE DAILY AT    NOON  . glucose blood (ONETOUCH VERIO) test strip Used to check blood two sugars times daily.E11.9.  . insulin aspart protamine - aspart (NOVOLOG MIX 70/30 FLEXPEN) (70-30) 100 UNIT/ML FlexPen 20 units with breakfast, and 8 units with supper, and pen needles 2/day  . mirtazapine (REMERON) 15 MG tablet TAKE 1 TABLET BY MOUTH EACH NIGHT AT BEDTIME IF NEEDED  . clopidogrel (PLAVIX) 75 MG tablet  Take 1 tablet (75 mg total) by mouth daily.  . [DISCONTINUED] metoprolol tartrate (LOPRESSOR) 25 MG tablet Take 1 tablet (25 mg total) by mouth 2 (two) times daily.     Allergies:   Pirfenidone and Aspirin   Social History   Socioeconomic History  . Marital status: Married    Spouse name: Not on file  . Number of children: Not on file  . Years of education: Not on file  .  Highest education level: Not on file  Occupational History  . Occupation: Retired    Fish farm manager: RETIRED  Social Needs  . Financial resource strain: Not on file  . Food insecurity:    Worry: Not on file    Inability: Not on file  . Transportation needs:    Medical: Not on file    Non-medical: Not on file  Tobacco Use  . Smoking status: Never Smoker  . Smokeless tobacco: Never Used  Substance and Sexual Activity  . Alcohol use: No    Alcohol/week: 0.0 standard drinks  . Drug use: No  . Sexual activity: Not on file  Lifestyle  . Physical activity:    Days per week: Not on file    Minutes per session: Not on file  . Stress: Not on file  Relationships  . Social connections:    Talks on phone: Not on file    Gets together: Not on file    Attends religious service: Not on file    Active member of club or organization: Not on file    Attends meetings of clubs or organizations: Not on file    Relationship status: Not on file  Other Topics Concern  . Not on file  Social History Narrative   Dentist        Family History: The patient's family history includes Cancer in her father, sister, and sister; Diabetes in her sister and sister; Other in her mother.  ROS:   Please see the history of present illness.    All other systems reviewed and are negative.  EKGs/Labs/Other Studies Reviewed:    The following studies were reviewed today:  EKG:  EKG is ordered today.  The ekg ordered today demonstrates sinus bradycardia with first degree av block, HR 54 bpm.  Recent Labs: 11/21/2017: ALT 9; BUN 19; Creatinine, Ser 0.85; Hemoglobin 12.0; Platelets 355.0; Potassium 4.1; Sodium 142; TSH 0.69  Recent Lipid Panel    Component Value Date/Time   CHOL 165 11/21/2017 1439   TRIG 152.0 (H) 11/21/2017 1439   HDL 38.80 (L) 11/21/2017 1439   CHOLHDL 4 11/21/2017 1439   VLDL 30.4 11/21/2017 1439   LDLCALC 96 11/21/2017 1439   LDLDIRECT 102.9 03/23/2014 1014     Physical Exam:    VS:  BP (!) 148/66   Pulse (!) 54   Ht 5' (1.524 m)   Wt 125 lb 3.2 oz (56.8 kg)   BMI 24.45 kg/m     Wt Readings from Last 3 Encounters:  05/19/18 125 lb 3.2 oz (56.8 kg)  04/28/18 125 lb 3.2 oz (56.8 kg)  04/24/18 127 lb 3.2 oz (57.7 kg)    Constitutional: No acute distress ENMT: moist mucous membranes Cardiovascular: regular rhythm, normal rate, no murmurs. S1 and S2 normal. Radial pulses normal bilaterally. No jugular venous distention.  Respiratory: clear to auscultation bilaterally GI : normal bowel sounds, soft and nontender. No distention.    MSK: extremities warm, well perfused. No edema.  NEURO: grossly nonfocal exam, moves all extremities.  PSYCH: alert and oriented x 3, normal mood and affect.  SKIN: Skin color, texture, turgor normal. No rashes or lesions  ASSESSMENT:    1. Sinus bradycardia   2. Essential hypertension   3. Fatigue, unspecified type   4. NSVT post-op   5. Hx of transient ischemic attack (TIA)    PLAN:    1. Sinus bradycardia   2. Essential hypertension   3. Fatigue, unspecified type   4. NSVT post-op   5. Hx of transient ischemic attack (TIA)    Her primary concern is fatigue after taking her morning medicines. She was placed on beta blockade after postoperative NSVT in 2016. While ideally we would continue this, I believe this may be contributing to her significant morning fatigue. We will trial cessation of metoprolol for the benefit of her symptoms, and bradycardia.   I will see her back in 4-6 weeks to reassess symptoms and review medications.   >30 minutes was spent in direct patient care, review of record and medication therapy.   Medication Adjustments/Labs and Tests Ordered: Current medicines are reviewed at length with the patient today.  Concerns regarding medicines are outlined above.  Orders Placed This Encounter  Procedures  . EKG 12-Lead   No orders of the defined types were placed in this  encounter.   Patient Instructions  Medication Instructions:  Your physician has recommended you make the following change in your medication:  1) STOP Plavix 2) STOP Metoprolol  If you need a refill on your cardiac medications before your next appointment, please call your pharmacy.   Lab work: None ordered  Testing/Procedures: None ordered  Follow-Up: At [facility], you and your health needs are our priority.  As part of our continuing mission to provide you with exceptional heart care, we have created designated Provider Care Teams.  These Care Teams include your primary Cardiologist (physician) and Advanced Practice Providers (APPs -  Physician Assistants and Nurse Practitioners) who all work together to provide you with the care you need, when you need it.  Your physician recommends that you schedule a follow-up appointment in: 4-6 weeks with Dr.Seldon Barrell  Any Other Special Instructions Will Be Listed Below (If Applicable).       Signed, Elouise Munroe, MD  05/19/2018 9:50 AM    [place]

## 2018-05-29 DIAGNOSIS — R5383 Other fatigue: Secondary | ICD-10-CM | POA: Insufficient documentation

## 2018-06-13 ENCOUNTER — Other Ambulatory Visit: Payer: Self-pay | Admitting: Endocrinology

## 2018-06-14 NOTE — Telephone Encounter (Signed)
Please refill x 3 months Further refills would have to be considered by new PCP   

## 2018-06-18 ENCOUNTER — Other Ambulatory Visit: Payer: Self-pay

## 2018-06-18 DIAGNOSIS — Z794 Long term (current) use of insulin: Principal | ICD-10-CM

## 2018-06-18 DIAGNOSIS — E119 Type 2 diabetes mellitus without complications: Secondary | ICD-10-CM

## 2018-06-18 MED ORDER — INSULIN ASPART PROT & ASPART (70-30 MIX) 100 UNIT/ML PEN: PEN_INJECTOR | SUBCUTANEOUS | 2 refills | Status: DC

## 2018-06-18 NOTE — Telephone Encounter (Signed)
Received notification from [facility] re: pt request to refill Novolog 70/30. Request authorized and refill sent as requested.

## 2018-06-22 ENCOUNTER — Other Ambulatory Visit: Payer: Self-pay

## 2018-06-22 ENCOUNTER — Telehealth: Payer: Self-pay | Admitting: Endocrinology

## 2018-06-22 DIAGNOSIS — E119 Type 2 diabetes mellitus without complications: Secondary | ICD-10-CM

## 2018-06-22 DIAGNOSIS — Z794 Long term (current) use of insulin: Principal | ICD-10-CM

## 2018-06-22 MED ORDER — CLOPIDOGREL BISULFATE 75 MG PO TABS: 75.0000 mg | ORAL_TABLET | Freq: Every day | ORAL | Status: DC

## 2018-06-22 MED ORDER — INSULIN ASPART PROT & ASPART (70-30 MIX) 100 UNIT/ML PEN: PEN_INJECTOR | SUBCUTANEOUS | 2 refills | Status: DC

## 2018-06-22 NOTE — Telephone Encounter (Signed)
MEDICATION: clopidogrel (PLAVIX) 75 MG tablet insulin aspart protamine - aspart (NOVOLOG MIX 70/30 FLEXPEN) (70-30) 100 UNIT/ML FlexPen  PHARMACY:  Contacted. [place], [place]  IS THIS A 90 DAY SUPPLY : Yes   IS PATIENT OUT OF MEDICATION: Yes  IF NOT; HOW MUCH IS LEFT:   LAST APPOINTMENT DATE: @12 /11/2017  NEXT APPOINTMENT DATE:@Visit  date not found  DO WE HAVE YOUR PERMISSION TO LEAVE A DETAILED MESSAGE: Yes  OTHER COMMENTS:    **Let patient know to contact pharmacy at the end of the day to make sure medication is ready. **  ** Please notify patient to allow 48-72 hours to process**  **Encourage patient to contact the pharmacy for refills or they can request refills through [facility]**

## 2018-06-22 NOTE — Telephone Encounter (Signed)
This has been done.

## 2018-07-01 ENCOUNTER — Encounter: Payer: Self-pay | Admitting: Internal Medicine

## 2018-07-01 ENCOUNTER — Ambulatory Visit (INDEPENDENT_AMBULATORY_CARE_PROVIDER_SITE_OTHER): Payer: Medicare Other | Admitting: Internal Medicine

## 2018-07-01 VITALS — BP 122/70 | HR 67 | Ht 61.0 in | Wt 126.2 lb

## 2018-07-01 DIAGNOSIS — Z8673 Personal history of transient ischemic attack (TIA), and cerebral infarction without residual deficits: Secondary | ICD-10-CM | POA: Diagnosis not present

## 2018-07-01 DIAGNOSIS — I4729 Other ventricular tachycardia: Secondary | ICD-10-CM

## 2018-07-01 DIAGNOSIS — R5383 Other fatigue: Secondary | ICD-10-CM | POA: Diagnosis not present

## 2018-07-01 DIAGNOSIS — I472 Ventricular tachycardia: Secondary | ICD-10-CM | POA: Diagnosis not present

## 2018-07-01 DIAGNOSIS — I10 Essential (primary) hypertension: Secondary | ICD-10-CM | POA: Diagnosis not present

## 2018-07-01 NOTE — Progress Notes (Signed)
Cardiology Office Note:    Date:  07/01/2018   ID:  Dorris Fetch, DOB 1933-03-21, MRN [id number]  PCP:  No primary care provider on file.  Cardiologist:  No primary care provider on file.  Electrophysiologist:  None   Referring MD: Renato Shin, MD   Sinus bradycardia and fatigue   History of Present Illness:    Tammy Hughes is a 84 y.o. female with a hx of diabetes requiring insulin, hyperlipidemia, and prior gastric adenocarcinoma, who presents today for follow up after seeing me in early November for symptomatic bradycardia.  We had stopped her metoprolol for bradycardia and fatigue after taking her morning medicines, and she tells me she feels much better.  She has continued her Plavix for the indication of previous TIA and confirms this today.  Her only concern today is frequent belching and stomach growling.  She says that this gets better with taking a Tums.  I have informed her that if this continues she should address it with her primary care physician, but there are no concerning cardiovascular features of her belching.  The patient denies chest pain, chest pressure, dyspnea at rest or with exertion, palpitations, PND, orthopnea, or leg swelling. Denies syncope or presyncope. Denies dizziness or lightheadedness. Denies snoring and has not be evaluated for sleep apnea.  Past Medical History:  Diagnosis Date  . Allergy    SEASONAL  . ANXIETY 03/08/2008  . Cataract    REMOVED BILATERAL  . Depression   . DIVERTICULOSIS, COLON 03/08/2008  . DVT (deep venous thrombosis) ([place])   . GERD 02/23/2007  . HYPERCHOLESTEROLEMIA 02/01/2008  . HYPERTENSION 03/08/2008  . Insulin dependent diabetes mellitus ([place])   . OSTEOARTHRITIS 02/23/2007  . OSTEOPOROSIS 02/23/2007  . PERIPHERAL NEUROPATHY 02/23/2007  . Stomach cancer ([place]) 07/20/14   invasive adenocarcinoma w/signet rings features  . Urolithiasis     Past Surgical History:  Procedure Laterality Date  . CARDIAC CATHETERIZATION N/A  02/02/2016   Procedure: Left Heart Cath and Coronary Angiography;  Surgeon: Peter M Martinique, MD;  Location: [facility];  Service: Cardiovascular;  Laterality: N/A;  . CHOLECYSTECTOMY  1995  . ESOPHAGOGASTRODUODENOSCOPY  06/08/2002  . GASTRECTOMY N/A 07/20/2014   Procedure: PROXIMAL GASTRECTOMY;  Surgeon: Stark Klein, MD;  Location: [facility];  Service: General;  Laterality: N/A;  . GASTROJEJUNOSTOMY N/A 07/20/2014   Procedure: GASTROJEJUNOSTOMY;  Surgeon: Stark Klein, MD;  Location: [place];  Service: General;  Laterality: N/A;  . LAPAROSCOPY N/A 07/20/2014   Procedure: LAPAROSCOPY DIAGNOSTIC;  Surgeon: Stark Klein, MD;  Location: [place];  Service: General;  Laterality: N/A;    Current Medications: Current Meds  Medication Sig  . clopidogrel (PLAVIX) 75 MG tablet Take 1 tablet (75 mg total) by mouth daily.  . diclofenac sodium (VOLTAREN) 1 % GEL Apply 4 g topically 4 (four) times daily. For knee pain  . esomeprazole (NEXIUM) 40 MG capsule TAKE 1 CAPSULE DAILY AT    NOON  . glucose blood (ONETOUCH VERIO) test strip Used to check blood two sugars times daily.E11.9.  . insulin aspart protamine - aspart (NOVOLOG MIX 70/30 FLEXPEN) (70-30) 100 UNIT/ML FlexPen 20 units with breakfast, and 8 units with supper, and pen needles 2/day  . mirtazapine (REMERON) 15 MG tablet TAKE 1 TABLET BY MOUTH AT BEDTIME AS NEEDED     Allergies:   Pirfenidone and Aspirin   Social History   Socioeconomic History  . Marital status: Married    Spouse name: Not on file  .  Number of children: Not on file  . Years of education: Not on file  . Highest education level: Not on file  Occupational History  . Occupation: Retired    Fish farm manager: RETIRED  Social Needs  . Financial resource strain: Not on file  . Food insecurity:    Worry: Not on file    Inability: Not on file  . Transportation needs:    Medical: Not on file    Non-medical: Not on file  Tobacco Use  . Smoking status: Never Smoker  . Smokeless tobacco:  Never Used  Substance and Sexual Activity  . Alcohol use: No    Alcohol/week: 0.0 standard drinks  . Drug use: No  . Sexual activity: Not on file  Lifestyle  . Physical activity:    Days per week: Not on file    Minutes per session: Not on file  . Stress: Not on file  Relationships  . Social connections:    Talks on phone: Not on file    Gets together: Not on file    Attends religious service: Not on file    Active member of club or organization: Not on file    Attends meetings of clubs or organizations: Not on file    Relationship status: Not on file  Other Topics Concern  . Not on file  Social History Narrative   Dentist        Family History: The patient's family history includes Cancer in her father, sister, and sister; Diabetes in her sister and sister; Other in her mother.  ROS:   Please see the history of present illness.    All other systems reviewed and are negative.  EKGs/Labs/Other Studies Reviewed:    The following studies were reviewed today:  EKG: Not performed  Recent Labs: 11/21/2017: ALT 9; BUN 19; Creatinine, Ser 0.85; Hemoglobin 12.0; Platelets 355.0; Potassium 4.1; Sodium 142; TSH 0.69  Recent Lipid Panel    Component Value Date/Time   CHOL 165 11/21/2017 1439   TRIG 152.0 (H) 11/21/2017 1439   HDL 38.80 (L) 11/21/2017 1439   CHOLHDL 4 11/21/2017 1439   VLDL 30.4 11/21/2017 1439   LDLCALC 96 11/21/2017 1439   LDLDIRECT 102.9 03/23/2014 1014    Physical Exam:    VS:  BP 122/70   Pulse 67   Ht 5\' 1"  (1.549 m)   Wt 126 lb 3.2 oz (57.2 kg)   BMI 23.85 kg/m     Wt Readings from Last 3 Encounters:  07/01/18 126 lb 3.2 oz (57.2 kg)  05/19/18 125 lb 3.2 oz (56.8 kg)  04/28/18 125 lb 3.2 oz (56.8 kg)     Constitutional: No acute distress Eyes: pupils equally round and reactive to light, sclera non-icteric, normal conjunctiva and lids ENMT: normal dentition, moist mucous membranes Cardiovascular: regular rhythm, normal  rate, no murmurs. S1 and S2 normal. Radial pulses normal bilaterally. No jugular venous distention.  Respiratory: clear to auscultation bilaterally GI : normal bowel sounds, soft and nontender. No distention.   MSK: extremities warm, well perfused. No edema.  NEURO: grossly nonfocal exam, moves all extremities. PSYCH: alert and oriented x 3, normal mood and affect.   ASSESSMENT:    1. Fatigue, unspecified type   2. NSVT post-op   3. Hx of transient ischemic attack (TIA)   4. Essential hypertension    PLAN:    Her fatigue and sinus bradycardia have resolved, and she feels much better.  She has no constitutional symptoms today.  We  have discussed continuing her other medications.   I would like to see her in follow-up in 6 months to ensure that she continues to do well.  She and her husband had no additional questions at the end of our visit.  Medication Adjustments/Labs and Tests Ordered: Current medicines are reviewed at length with the patient today.  Concerns regarding medicines are outlined above.  No orders of the defined types were placed in this encounter.  No orders of the defined types were placed in this encounter.   Patient Instructions  Medication Instructions:  Your physician recommends that you continue on your current medications as directed. Please refer to the Current Medication list given to you today.  If you need a refill on your cardiac medications before your next appointment, please call your pharmacy.   Lab work: None ordered If you have labs (blood work) drawn today and your tests are completely normal, you will receive your results only by: Marland Kitchen MyChart Message (if you have MyChart) OR . A paper copy in the mail If you have any lab test that is abnormal or we need to change your treatment, we will call you to review the results.  Testing/Procedures: None ordered  Follow-Up: At [facility], you and your health needs are our priority.  As part of our  continuing mission to provide you with exceptional heart care, we have created designated Provider Care Teams.  These Care Teams include your primary Cardiologist (physician) and Advanced Practice Providers (APPs -  Physician Assistants and Nurse Practitioners) who all work together to provide you with the care you need, when you need it. You will need a follow up appointment in 3 months.   Any Other Special Instructions Will Be Listed Below (If Applicable).       Signed, Elouise Munroe, MD  07/01/2018 2:13 PM    [facility]

## 2018-07-01 NOTE — Patient Instructions (Signed)
Medication Instructions:  Your physician recommends that you continue on your current medications as directed. Please refer to the Current Medication list given to you today.  If you need a refill on your cardiac medications before your next appointment, please call your pharmacy.   Lab work: None ordered If you have labs (blood work) drawn today and your tests are completely normal, you will receive your results only by: Marland Kitchen MyChart Message (if you have MyChart) OR . A paper copy in the mail If you have any lab test that is abnormal or we need to change your treatment, we will call you to review the results.  Testing/Procedures: None ordered  Follow-Up: At [facility], you and your health needs are our priority.  As part of our continuing mission to provide you with exceptional heart care, we have created designated Provider Care Teams.  These Care Teams include your primary Cardiologist (physician) and Advanced Practice Providers (APPs -  Physician Assistants and Nurse Practitioners) who all work together to provide you with the care you need, when you need it. You will need a follow up appointment in 3 months.   Any Other Special Instructions Will Be Listed Below (If Applicable).

## 2018-08-03 ENCOUNTER — Ambulatory Visit (INDEPENDENT_AMBULATORY_CARE_PROVIDER_SITE_OTHER): Payer: Medicare Other | Admitting: Endocrinology

## 2018-08-03 ENCOUNTER — Encounter: Payer: Self-pay | Admitting: Endocrinology

## 2018-08-03 VITALS — BP 134/78 | HR 75 | Ht 61.0 in | Wt 123.0 lb

## 2018-08-03 DIAGNOSIS — E119 Type 2 diabetes mellitus without complications: Secondary | ICD-10-CM | POA: Diagnosis not present

## 2018-08-03 DIAGNOSIS — Z794 Long term (current) use of insulin: Secondary | ICD-10-CM

## 2018-08-03 LAB — POCT GLYCOSYLATED HEMOGLOBIN (HGB A1C): Hemoglobin A1C: 6.9 % — AB (ref 4.0–5.6)

## 2018-08-03 MED ORDER — INSULIN ASPART PROT & ASPART (70-30 MIX) 100 UNIT/ML PEN: PEN_INJECTOR | SUBCUTANEOUS | 2 refills | Status: DC

## 2018-08-03 NOTE — Patient Instructions (Signed)
Please reduce the supper insulin to 6 units. check your blood sugar twice a day.  vary the time of day when you check, between before the 3 meals, and at bedtime.  also check if you have symptoms of your blood sugar being too high or too low.  please keep a record of the readings and bring it to your next appointment here (or you can bring the meter itself).  You can write it on any piece of paper.  please call us sooner if your blood sugar goes below 70, or if you have a lot of readings over 200. Best wishes with your new primary care provider.  Please come back for a follow-up appointment in 4 months, for the diabetes.

## 2018-08-03 NOTE — Progress Notes (Signed)
Subjective:    Patient ID: Tammy Hughes, female    DOB: 31-Aug-1932, 85 y.o.   MRN: [id number]  HPI Pt returns for f/u of diabetes mellitus:  DM type: Insulin-requiring type 2.  Dx'ed: 2025.   Complications: polyneuropathy and CAD.  Therapy: insulin since 2005.  GDM: never.   DKA: never.   Severe hypoglycemia: never.   Pancreatitis: never.  Other: she chose bid premixed insulin.   Interval history: she brings a record of her cbg's which I have reviewed today.  it varies from 93-400.  There is no trend throughout the day, except it is lowest at HS.    Past Medical History:  Diagnosis Date  . Allergy    SEASONAL  . ANXIETY 03/08/2008  . Cataract    REMOVED BILATERAL  . Depression   . DIVERTICULOSIS, COLON 03/08/2008  . DVT (deep venous thrombosis) ([place])   . GERD 02/23/2007  . HYPERCHOLESTEROLEMIA 02/01/2008  . HYPERTENSION 03/08/2008  . Insulin dependent diabetes mellitus ([place])   . OSTEOARTHRITIS 02/23/2007  . OSTEOPOROSIS 02/23/2007  . PERIPHERAL NEUROPATHY 02/23/2007  . Stomach cancer ([place]) 07/20/14   invasive adenocarcinoma w/signet rings features  . Urolithiasis     Past Surgical History:  Procedure Laterality Date  . CARDIAC CATHETERIZATION N/A 02/02/2016   Procedure: Left Heart Cath and Coronary Angiography;  Surgeon: Peter M Martinique, MD;  Location: [facility];  Service: Cardiovascular;  Laterality: N/A;  . CHOLECYSTECTOMY  1995  . ESOPHAGOGASTRODUODENOSCOPY  06/08/2002  . GASTRECTOMY N/A 07/20/2014   Procedure: PROXIMAL GASTRECTOMY;  Surgeon: Stark Klein, MD;  Location: [facility];  Service: General;  Laterality: N/A;  . GASTROJEJUNOSTOMY N/A 07/20/2014   Procedure: GASTROJEJUNOSTOMY;  Surgeon: Stark Klein, MD;  Location: [facility];  Service: General;  Laterality: N/A;  . LAPAROSCOPY N/A 07/20/2014   Procedure: LAPAROSCOPY DIAGNOSTIC;  Surgeon: Stark Klein, MD;  Location: [facility];  Service: General;  Laterality: N/A;    Social History   Socioeconomic History  . Marital  status: Married    Spouse name: Not on file  . Number of children: Not on file  . Years of education: Not on file  . Highest education level: Not on file  Occupational History  . Occupation: Retired    Fish farm manager: RETIRED  Social Needs  . Financial resource strain: Not on file  . Food insecurity:    Worry: Not on file    Inability: Not on file  . Transportation needs:    Medical: Not on file    Non-medical: Not on file  Tobacco Use  . Smoking status: Never Smoker  . Smokeless tobacco: Never Used  Substance and Sexual Activity  . Alcohol use: No    Alcohol/week: 0.0 standard drinks  . Drug use: No  . Sexual activity: Not on file  Lifestyle  . Physical activity:    Days per week: Not on file    Minutes per session: Not on file  . Stress: Not on file  Relationships  . Social connections:    Talks on phone: Not on file    Gets together: Not on file    Attends religious service: Not on file    Active member of club or organization: Not on file    Attends meetings of clubs or organizations: Not on file    Relationship status: Not on file  . Intimate partner violence:    Fear of current or ex partner: Not on file    Emotionally abused: Not on file  Physically abused: Not on file    Forced sexual activity: Not on file  Other Topics Concern  . Not on file  Social History Narrative   Dentist       Current Outpatient Medications on File Prior to Visit  Medication Sig Dispense Refill  . clopidogrel (PLAVIX) 75 MG tablet Take 1 tablet (75 mg total) by mouth daily.    . diclofenac sodium (VOLTAREN) 1 % GEL Apply 4 g topically 4 (four) times daily. For knee pain 100 g 11  . esomeprazole (NEXIUM) 40 MG capsule TAKE 1 CAPSULE DAILY AT    NOON 30 capsule 8  . glucose blood (ONETOUCH VERIO) test strip Used to check blood two sugars times daily.E11.9. 100 each 12  . mirtazapine (REMERON) 15 MG tablet TAKE 1 TABLET BY MOUTH AT BEDTIME AS NEEDED 30 tablet 3   No  current facility-administered medications on file prior to visit.     Allergies  Allergen Reactions  . Pirfenidone Diarrhea and Nausea And Vomiting  . Aspirin     rash    Family History  Problem Relation Age of Onset  . Cancer Father        Prostate Cancer  . Cancer Sister        Breast Cancer  . Diabetes Sister   . Diabetes Sister   . Other Mother        pneumonia   . Cancer Sister        breast cancer     BP 134/78 (BP Location: Left Arm, Patient Position: Sitting, Cuff Size: Normal)   Pulse 75   Ht 5\' 1"  (1.549 m)   Wt 123 lb (55.8 kg)   SpO2 94%   BMI 23.24 kg/m    Review of Systems She denies hypoglycemia.     Objective:   Physical Exam VITAL SIGNS:  See vs page GENERAL: no distress Pulses: dorsalis pedis intact bilat.   MSK: no deformity of the feet CV: no leg edema Skin:  no ulcer on the feet, but the skin is dry.  normal color and temp on the feet. Neuro: sensation is intact to touch on the feet.   Lab Results  Component Value Date   HGBA1C 6.9 (A) 08/03/2018   Lab Results  Component Value Date   CREATININE 0.85 11/21/2017   BUN 19 11/21/2017   NA 142 11/21/2017   K 4.1 11/21/2017   CL 107 11/21/2017   CO2 29 11/21/2017        Assessment & Plan:  Insulin-requiring type 2 DM, with CAD: overcontrolled, given this regimen, which does match insulin to her changing needs throughout the day.   Patient Instructions  Please reduce the supper insulin to 6 units. check your blood sugar twice a day.  vary the time of day when you check, between before the 3 meals, and at bedtime.  also check if you have symptoms of your blood sugar being too high or too low.  please keep a record of the readings and bring it to your next appointment here (or you can bring the meter itself).  You can write it on any piece of paper.  please call us sooner if your blood sugar goes below 70, or if you have a lot of readings over 200. Best wishes with your new primary care  provider.  Please come back for a follow-up appointment in 4 months, for the diabetes.

## 2018-08-07 DIAGNOSIS — R51 Headache: Secondary | ICD-10-CM | POA: Diagnosis not present

## 2018-08-07 DIAGNOSIS — J069 Acute upper respiratory infection, unspecified: Secondary | ICD-10-CM | POA: Diagnosis not present

## 2018-08-07 DIAGNOSIS — Z7902 Long term (current) use of antithrombotics/antiplatelets: Secondary | ICD-10-CM | POA: Diagnosis not present

## 2018-08-07 DIAGNOSIS — E119 Type 2 diabetes mellitus without complications: Secondary | ICD-10-CM | POA: Diagnosis not present

## 2018-08-07 DIAGNOSIS — Z79899 Other long term (current) drug therapy: Secondary | ICD-10-CM | POA: Diagnosis not present

## 2018-08-07 DIAGNOSIS — Z794 Long term (current) use of insulin: Secondary | ICD-10-CM | POA: Diagnosis not present

## 2018-08-07 DIAGNOSIS — Z8673 Personal history of transient ischemic attack (TIA), and cerebral infarction without residual deficits: Secondary | ICD-10-CM | POA: Diagnosis not present

## 2018-08-13 ENCOUNTER — Encounter: Payer: Self-pay | Admitting: Family Medicine

## 2018-08-13 ENCOUNTER — Ambulatory Visit (INDEPENDENT_AMBULATORY_CARE_PROVIDER_SITE_OTHER): Payer: Medicare Other | Admitting: Family Medicine

## 2018-08-13 VITALS — BP 126/62 | HR 80 | Temp 97.7°F | Ht 61.0 in | Wt 124.0 lb

## 2018-08-13 DIAGNOSIS — N8110 Cystocele, unspecified: Secondary | ICD-10-CM | POA: Diagnosis not present

## 2018-08-13 DIAGNOSIS — I10 Essential (primary) hypertension: Secondary | ICD-10-CM | POA: Diagnosis not present

## 2018-08-13 DIAGNOSIS — N898 Other specified noninflammatory disorders of vagina: Secondary | ICD-10-CM | POA: Diagnosis not present

## 2018-08-13 DIAGNOSIS — F329 Major depressive disorder, single episode, unspecified: Secondary | ICD-10-CM | POA: Insufficient documentation

## 2018-08-13 DIAGNOSIS — E1142 Type 2 diabetes mellitus with diabetic polyneuropathy: Secondary | ICD-10-CM | POA: Diagnosis not present

## 2018-08-13 DIAGNOSIS — G459 Transient cerebral ischemic attack, unspecified: Secondary | ICD-10-CM | POA: Insufficient documentation

## 2018-08-13 DIAGNOSIS — I2510 Atherosclerotic heart disease of native coronary artery without angina pectoris: Secondary | ICD-10-CM | POA: Insufficient documentation

## 2018-08-13 DIAGNOSIS — F32A Depression, unspecified: Secondary | ICD-10-CM | POA: Insufficient documentation

## 2018-08-13 LAB — COMPREHENSIVE METABOLIC PANEL
ALK PHOS: 101 U/L (ref 39–117)
ALT: 11 U/L (ref 0–35)
AST: 15 U/L (ref 0–37)
Albumin: 3.7 g/dL (ref 3.5–5.2)
BUN: 16 mg/dL (ref 6–23)
CHLORIDE: 105 meq/L (ref 96–112)
CO2: 29 mEq/L (ref 19–32)
CREATININE: 0.91 mg/dL (ref 0.40–1.20)
Calcium: 10.3 mg/dL (ref 8.4–10.5)
GFR: 71.38 mL/min (ref >60.00)
Glucose, Bld: 129 mg/dL — ABNORMAL HIGH (ref 70–99)
Potassium: 4.2 mEq/L (ref 3.5–5.1)
SODIUM: 141 meq/L (ref 135–145)
TOTAL PROTEIN: 7.4 g/dL (ref 6.0–8.3)
Total Bilirubin: 0.3 mg/dL (ref 0.2–1.2)

## 2018-08-13 LAB — CBC WITH DIFFERENTIAL/PLATELET
Basophils Absolute: 0 10*3/uL (ref 0.0–0.1)
Basophils Relative: 0.5 % (ref 0.0–3.0)
Eosinophils Absolute: 0.1 10*3/uL (ref 0.0–0.7)
Eosinophils Relative: 1.4 % (ref 0.0–5.0)
HCT: 37.1 % (ref 36.0–46.0)
HEMOGLOBIN: 12 g/dL (ref 12.0–15.0)
Lymphocytes Relative: 26.2 % (ref 12.0–46.0)
Lymphs Abs: 1.7 10*3/uL (ref 0.7–4.0)
MCHC: 32.3 g/dL (ref 30.0–36.0)
MCV: 89.2 fl (ref 78.0–100.0)
Monocytes Absolute: 0.6 10*3/uL (ref 0.1–1.0)
Monocytes Relative: 9 % (ref 3.0–12.0)
Neutro Abs: 4.2 10*3/uL (ref 1.4–7.7)
Neutrophils Relative %: 62.9 % (ref 43.0–77.0)
Platelets: 321 10*3/uL (ref 150.0–400.0)
RBC: 4.15 Mil/uL (ref 3.87–5.11)
RDW: 13.5 % (ref 11.5–15.5)
WBC: 6.6 10*3/uL (ref 4.0–10.5)

## 2018-08-13 NOTE — Patient Instructions (Signed)
Pelvic Organ Prolapse Pelvic organ prolapse is the stretching, bulging, or dropping of pelvic organs into an abnormal position. It happens when the muscles and tissues that surround and support pelvic structures become weak or stretched. Pelvic organ prolapse can involve the:  Vagina (vaginal prolapse).  Uterus (uterine prolapse).  Bladder (cystocele).  Rectum (rectocele).  Intestines (enterocele). When organs other than the vagina are involved, they often bulge into the vagina or protrude from the vagina, depending on how severe the prolapse is. What are the causes? This condition may be caused by:  Pregnancy, labor, and childbirth.  Past pelvic surgery.  Decreased production of the hormone estrogen associated with menopause.  Consistently lifting more than 50 lb (23 kg).  Obesity.  Long-term inability to pass stool (chronic constipation).  A cough that lasts a long time (chronic).  Buildup of fluid in the abdomen due to certain diseases and other conditions. What are the signs or symptoms? Symptoms of this condition include:  Passing a little urine (loss of bladder control) when you cough, sneeze, strain, and exercise (stress incontinence). This may be worse immediately after childbirth. It may gradually improve over time.  Feeling pressure in your pelvis or vagina. This pressure may increase when you cough or when you are passing stool.  A bulge that protrudes from the opening of your vagina.  Difficulty passing urine or stool.  Pain in your lower back.  Pain, discomfort, or disinterest in sex.  Repeated bladder infections (urinary tract infections).  Difficulty inserting a tampon. In some people, this condition causes no symptoms. How is this diagnosed? This condition may be diagnosed based on a vaginal and rectal exam. During the exam, you may be asked to cough and strain while you are lying down, sitting, and standing up. Your health care provider will  determine if other tests are required, such as bladder function tests. How is this treated? Treatment for this condition may depend on your symptoms. Treatment may include:  Lifestyle changes, such as changes to your diet.  Emptying your bladder at scheduled times (bladder training therapy). This can help reduce or avoid urinary incontinence.  Estrogen. Estrogen may help mild prolapse by increasing the strength and tone of pelvic floor muscles.  Kegel exercises. These may help mild cases of prolapse by strengthening and tightening the muscles of the pelvic floor.  A soft, flexible device that helps support the vaginal walls and keep pelvic organs in place (pessary). This is inserted into your vagina by your health care provider.  Surgery. This is often the only form of treatment for severe prolapse. Follow these instructions at home:  Avoid drinking beverages that contain caffeine or alcohol.  Increase your intake of high-fiber foods. This can help decrease constipation and straining during bowel movements.  Lose weight if recommended by your health care provider.  Wear a sanitary pad or adult diapers if you have urinary incontinence.  Avoid heavy lifting and straining with exercise and work. Do not hold your breath when you perform mild to moderate lifting and exercise activities. Limit your activities as directed by your health care provider.  Do Kegel exercises as directed by your health care provider. To do this: ? Squeeze your pelvic floor muscles tight. You should feel a tight lift in your rectal area and a tightness in your vaginal area. Keep your stomach, buttocks, and legs relaxed. ? Hold the muscles tight for up to 10 seconds. ? Relax your muscles. ? Repeat this exercise 50 times a day,   or as many times as told by your health care provider. Continue to do this exercise for at least 4-6 weeks, or for as long as told by your health care provider.  Take over-the-counter and  prescription medicines only as told by your health care provider.  If you have a pessary, take care of it as told by your health care provider.  Keep all follow-up visits as told by your health care provider. This is important. Contact a health care provider if you:  Have symptoms that interfere with your daily activities or sex life.  Need medicine to help with the discomfort.  Notice bleeding from your vagina that is not related to your period.  Have a fever.  Have pain or bleeding when you urinate.  Have bleeding when you pass stool.  Pass urine when you have sex.  Have chronic constipation.  Have a pessary that falls out.  Have bad smelling vaginal discharge.  Have an unusual, low pain in your abdomen. Summary  Pelvic organ prolapse is the stretching, bulging, or dropping of pelvic organs into an abnormal position. It happens when the muscles and tissues that surround and support pelvic structures become weak or stretched.  When organs other than the vagina are involved, they often bulge into the vagina or protrude from the vagina, depending on how severe the prolapse is.  In most cases, this condition needs to be treated only if it produces symptoms. Treatment may include lifestyle changes, estrogen, Kegel exercises, pessary insertion, or surgery.  Avoid heavy lifting and straining with exercise and work. Do not hold your breath when you perform mild to moderate lifting and exercise activities. Limit your activities as directed by your health care provider. This information is not intended to replace advice given to you by your health care provider. Make sure you discuss any questions you have with your health care provider. Document Released: 01/26/2014 Document Revised: 07/23/2017 Document Reviewed: 07/23/2017 Elsevier Interactive Patient Education  2019 Elsevier Inc.  

## 2018-08-13 NOTE — Progress Notes (Signed)
Patient: Tammy Hughes MRN: [id number] DOB: 06/12/1933 PCP: Orma Flaming, MD     Subjective:  Chief Complaint  Patient presents with  . Transitions Of Care    HPI: The patient is a 85 y.o. female who presents today for transferring care. She is followed by Dr. Loanne Drilling for her diabetes and is going to transfer here for her medical care. She is also followed by cardiology, but she is unsure of who. Had a NSTEMI in 2016 and hx of CAD. Hx of gastric adenocarcinoma in 2016. Followed by cardiology, Dr. Margaretann Loveless. Saw her for morning fatigue and her beta blocker was stopped. She could tell an improvement in her symptoms.    Hypertension: Here for follow up of hypertension.  Currently on no medication. Takes medication as prescribed and denies any side effects.  Weight has been stable. Denies any chest pain, headaches, shortness of breath, vision changes, swelling in lower extremities. Last labs were in 11/2017.  She also feels like she has something falling out of her when she urinates. She describes it as her ovary coming out. It is only when she urinates and feels it in her vagina. She feels like this has been going on x months.   Has had her pnumonia shots, flu shot and tdap. Also has had her shingles vaccine. She does not need refills of her medication.   Review of Systems  Constitutional: Negative for chills, fatigue, fever and unexpected weight change.  HENT: Negative for congestion, ear pain, rhinorrhea, sore throat and tinnitus.   Respiratory: Negative for cough, shortness of breath and wheezing.   Cardiovascular: Negative for chest pain, palpitations and leg swelling.  Gastrointestinal: Negative for abdominal pain, diarrhea, nausea and vomiting.  Genitourinary: Negative for dysuria, frequency and urgency.       Feels like something falling out during urination in her vagina   Neurological: Negative for dizziness, seizures, facial asymmetry and numbness.  Psychiatric/Behavioral:  Negative for behavioral problems, confusion, hallucinations, sleep disturbance and suicidal ideas.    Allergies Patient is allergic to pirfenidone and aspirin.  Past Medical History Patient  has a past medical history of Allergy, ANXIETY (03/08/2008), Cataract, Depression, DIVERTICULOSIS, COLON (03/08/2008), DVT (deep venous thrombosis) ([place]), GERD (02/23/2007), HYPERCHOLESTEROLEMIA (02/01/2008), HYPERTENSION (03/08/2008), Insulin dependent diabetes mellitus ([place]), OSTEOARTHRITIS (02/23/2007), OSTEOPOROSIS (02/23/2007), PERIPHERAL NEUROPATHY (02/23/2007), Stomach cancer ([place]) (07/20/14), and Urolithiasis.  Surgical History Patient  has a past surgical history that includes Cholecystectomy (1995); Esophagogastroduodenoscopy (06/08/2002); Gastrectomy (N/A, 07/20/2014); laparoscopy (N/A, 07/20/2014); Gastrojejunostomy (N/A, 07/20/2014); and Cardiac catheterization (N/A, 02/02/2016).  Family History Pateint's family history includes Cancer in her father, sister, and sister; Diabetes in her sister and sister; Other in her mother.  Social History Patient  reports that she has never smoked. She has never used smokeless tobacco. She reports that she does not drink alcohol or use drugs.    Objective: Vitals:   08/13/18 1300  BP: 126/62  Pulse: 80  Temp: 97.7 F (36.5 C)  TempSrc: Oral  SpO2: 98%  Weight: 124 lb (56.2 kg)  Height: 5\' 1"  (1.549 m)    Body mass index is 23.43 kg/m.  Physical Exam Vitals signs reviewed.  Constitutional:      Appearance: She is normal weight.  HENT:     Right Ear: Tympanic membrane, ear canal and external ear normal.     Left Ear: Tympanic membrane, ear canal and external ear normal.     Nose: Nose normal.     Mouth/Throat:     Mouth: Mucous membranes  are moist.     Comments: Dentures  Neck:     Musculoskeletal: Normal range of motion and neck supple.  Cardiovascular:     Rate and Rhythm: Normal rate and regular rhythm.     Heart sounds: Murmur present.   Pulmonary:     Effort: Pulmonary effort is normal.     Comments: + velcro noise in bilateral bases, worse on left  Abdominal:     General: Abdomen is flat. Bowel sounds are normal.     Palpations: Abdomen is soft.  Genitourinary:    General: Normal vulva.     Comments: +bladder prolapse. Also has some vesicles on her left perineal area at end of introitus. They are clustered with what appears to be pus.  Skin:    Findings: Lesion (see GU ) present.  Neurological:     Mental Status: She is alert.    Culture taken of perineal vesicles. Unroofed and sent for HSV     Depression screen [facility] 2/9 08/13/2018 05/04/2015  Decreased Interest 0 0  Down, Depressed, Hopeless 0 0  PHQ - 2 Score 0 0  Some recent data might be hidden     Assessment/plan: 1. Type 2 diabetes mellitus with peripheral neuropathy (HCC) Followed by Dr. Loanne Drilling, checking 6 month labs today which are overdue since last labs due in 11/2017. Had has up/uc and lipid within past year. utd on immunizations. Will continue to follow with Dr. Loanne Drilling for diabetic care. Not on statin with diabetes/tia/cad history, but she is 85 years.  - Comprehensive metabolic panel - CBC with Differential/Platelet  2. Essential hypertension To goal off medication. Continue ot monitor q 6 months.   3. Vaginal lesion Concern for herpes, sending for culture.  - Herpes simplex virus culture  4. Female bladder prolapse May benefit from a pessary. Not a candidate for surgery, but will let her discuss with gynecology her options.  - Ambulatory referral to Gynecology   -chart updated and spent >45 minutes in face to face care coordination/counseling.    Return in about 6 months (around 02/11/2019).   Orma Flaming, MD [place]   08/13/2018

## 2018-08-14 ENCOUNTER — Encounter: Payer: Self-pay | Admitting: Obstetrics and Gynecology

## 2018-08-17 LAB — HERPES SIMPLEX VIRUS CULTURE
MICRO NUMBER:: 128877
SPECIMEN QUALITY:: ADEQUATE

## 2018-08-19 ENCOUNTER — Other Ambulatory Visit: Payer: Self-pay | Admitting: Family Medicine

## 2018-08-19 ENCOUNTER — Encounter: Payer: Self-pay | Admitting: Family Medicine

## 2018-08-19 DIAGNOSIS — A6000 Herpesviral infection of urogenital system, unspecified: Secondary | ICD-10-CM | POA: Insufficient documentation

## 2018-08-19 MED ORDER — VALACYCLOVIR HCL 500 MG PO TABS: 500.0000 mg | ORAL_TABLET | Freq: Two times a day (BID) | ORAL | 3 refills | Status: AC

## 2018-08-24 ENCOUNTER — Inpatient Hospital Stay: Payer: Medicare Other | Attending: Oncology | Admitting: Oncology

## 2018-08-24 VITALS — BP 154/63 | HR 66 | Temp 98.1°F | Resp 19 | Ht 61.0 in | Wt 125.3 lb

## 2018-08-24 DIAGNOSIS — E119 Type 2 diabetes mellitus without complications: Secondary | ICD-10-CM | POA: Diagnosis not present

## 2018-08-24 DIAGNOSIS — F329 Major depressive disorder, single episode, unspecified: Secondary | ICD-10-CM | POA: Insufficient documentation

## 2018-08-24 DIAGNOSIS — Z86718 Personal history of other venous thrombosis and embolism: Secondary | ICD-10-CM

## 2018-08-24 DIAGNOSIS — C169 Malignant neoplasm of stomach, unspecified: Secondary | ICD-10-CM

## 2018-08-24 DIAGNOSIS — C165 Malignant neoplasm of lesser curvature of stomach, unspecified: Secondary | ICD-10-CM | POA: Diagnosis not present

## 2018-08-24 NOTE — Progress Notes (Signed)
  [facility] OFFICE PROGRESS NOTE   Diagnosis: Gastric cancer  INTERVAL HISTORY:   Tammy Hughes returns as scheduled.  She feels well.  Good appetite and energy level.  No complaint.  Objective:  Vital signs in last 24 hours:  Blood pressure (!) 154/63, pulse 66, temperature 98.1 F (36.7 C), temperature source Oral, resp. rate 19, height 5\' 1"  (1.549 m), weight 125 lb 4.8 oz (56.8 kg), SpO2 98 %.    HEENT: Neck without mass Lymphatics: No cervical, supraclavicular, or axillary nodes Resp: Diffuse inspiratory rales at the posterior chest, no respiratory distress Cardio: Regular rate and rhythm GI: No hepatosplenomegaly, no mass, nontender Vascular: No leg edema, the right lower leg is larger than the left side  Breast: No mass in the lateral left breast or tail of the left breast, soft mobile fullness at the left axillary line- likely muscular, no discrete mass    Lab Results:  Lab Results  Component Value Date   WBC 6.6 08/13/2018   HGB 12.0 08/13/2018   HCT 37.1 08/13/2018   MCV 89.2 08/13/2018   PLT 321.0 08/13/2018   NEUTROABS 4.2 08/13/2018    CMP  Lab Results  Component Value Date   NA 141 08/13/2018   K 4.2 08/13/2018   CL 105 08/13/2018   CO2 29 08/13/2018   GLUCOSE 129 (H) 08/13/2018   BUN 16 08/13/2018   CREATININE 0.91 08/13/2018   CALCIUM 10.3 08/13/2018   PROT 7.4 08/13/2018   ALBUMIN 3.7 08/13/2018   AST 15 08/13/2018   ALT 11 08/13/2018   ALKPHOS 101 08/13/2018   BILITOT 0.3 08/13/2018   GFRNONAA 48 (L) 11/24/2016   GFRAA 56 (L) 11/24/2016    Medications: I have reviewed the patient's current medications.   Assessment/Plan: 1. Gastric cancer, status post an endoscopic biopsy of a lesser curvature mass on 05/31/2014 confirming adenocarcinoma ? Staging CTs of the chest and abdomen on 06/08/2014 revealed no evidence of metastatic disease ? Proximal gastrectomy and feeding jejunostomy 07/20/2014, pT2,pN1 ? Adjuvant weekly  5-Fu/leucovorin 09/14/2014, 09/21/2014, 09/28/2014 and 10/05/2014 ? Initiation of radiation and infusional 5-FU 10/17/2014 ? 5-fluorouracil pump discontinued on 10/27/2014 due to toxicity. ? Radiation discontinued 11/02/2014  2. Pulmonary fibrosis 3. Esophageal stricture, status post a dilatation procedure 05/31/2014 4. Diabetes 5. Postoperative ventricle tachycardia, NSTEMI January 2016 6. Hospital-acquired pneumonia January 2016 7. CT abdomen/pelvis 09/02/2014 with postsurgical changes. Focal fluid collection in the midline abutting the distal greater curvature of the stomach measuring 2.2 x 2.9 x 3.4 cm. Mild stranding in the adjacent fat. 8. PICC placement 10/17/2014 9. Rash. Most likely related to the 5-fluorouracil. Resolved. 10. Right upper extremity DVT 10/25/2014. She completed a course of Xarelto. 11. Hospitalization 10/27/2014 through 10/28/2014 nausea/vomiting and left arm discomfort. Noted to have skin toxicity from the 5-fluorouracil. The 5-fluorouracil was discontinued. 12. Depression. She continues Remeron.      Disposition: Tammy Hughes is in clinical remission from gastric cancer.  She will return for an office visit in 6 months.  I cannot palpate a clear abnormality at the left axilla or left breast today.    [name], MD  08/24/2018  12:17 PM

## 2018-08-25 ENCOUNTER — Encounter: Payer: Self-pay | Admitting: Obstetrics and Gynecology

## 2018-08-25 ENCOUNTER — Telehealth: Payer: Self-pay | Admitting: Oncology

## 2018-08-25 ENCOUNTER — Other Ambulatory Visit: Payer: Self-pay

## 2018-08-25 ENCOUNTER — Ambulatory Visit (INDEPENDENT_AMBULATORY_CARE_PROVIDER_SITE_OTHER): Payer: Medicare Other | Admitting: Obstetrics and Gynecology

## 2018-08-25 VITALS — BP 148/80 | HR 80 | Ht 61.0 in | Wt 127.2 lb

## 2018-08-25 DIAGNOSIS — N8111 Cystocele, midline: Secondary | ICD-10-CM | POA: Diagnosis not present

## 2018-08-25 DIAGNOSIS — N814 Uterovaginal prolapse, unspecified: Secondary | ICD-10-CM

## 2018-08-25 NOTE — Progress Notes (Signed)
85 y.o. [id number] Married Black or [place] American Not Hispanic or Latino female here for evaluation of bladder prolapse. She c/o a 3-4 month h/o noticing a bulge when she wipes, no pain, doesn't notice it unless she is wiping. She is emptying her bladder okay. No leakage. She was struggling with constipation, she started medication ~2 months ago which has helped. Not bulging quite as much since taking the medication. No vaginal bleeding, not sexually active.     No LMP recorded. Patient is postmenopausal.          Sexually active: No.  The current method of family planning is post menopausal status.    Exercising: No.  The patient does not participate in regular exercise at present. Smoker:  no  Health Maintenance: Pap:  Unsure History of abnormal Pap:  no MMG:  10 or more years ago per patient, normal BMD:   Patient is unsure Colonoscopy: 2011 diverticulosis, internal hemorrhoids TDaP:  11/21/2017 Gardasil: None   reports that she has never smoked. She has never used smokeless tobacco. She reports that she does not drink alcohol or use drugs.  Past Medical History:  Diagnosis Date  . Allergy    SEASONAL  . ANXIETY 03/08/2008  . Cataract    REMOVED BILATERAL  . Depression   . DIVERTICULOSIS, COLON 03/08/2008  . DVT (deep venous thrombosis) ([place])   . GERD 02/23/2007  . HYPERCHOLESTEROLEMIA 02/01/2008  . HYPERTENSION 03/08/2008  . Insulin dependent diabetes mellitus ([place])   . OSTEOARTHRITIS 02/23/2007  . OSTEOPOROSIS 02/23/2007  . PERIPHERAL NEUROPATHY 02/23/2007  . STD (sexually transmitted disease)   . Stomach cancer ([facility]) 07/20/14   invasive adenocarcinoma w/signet rings features  . Urinary incontinence   . Urolithiasis     Past Surgical History:  Procedure Laterality Date  . ABDOMINAL SURGERY    . CARDIAC CATHETERIZATION N/A 02/02/2016   Procedure: Left Heart Cath and Coronary Angiography;  Surgeon: Peter M Martinique, MD;  Location: [facility];  Service: Cardiovascular;   Laterality: N/A;  . CHOLECYSTECTOMY  1995  . ESOPHAGOGASTRODUODENOSCOPY  06/08/2002  . GASTRECTOMY N/A 07/20/2014   Procedure: PROXIMAL GASTRECTOMY;  Surgeon: Stark Klein, MD;  Location: [facility];  Service: General;  Laterality: N/A;  . GASTROJEJUNOSTOMY N/A 07/20/2014   Procedure: GASTROJEJUNOSTOMY;  Surgeon: Stark Klein, MD;  Location: [facility];  Service: General;  Laterality: N/A;  . LAPAROSCOPY N/A 07/20/2014   Procedure: LAPAROSCOPY DIAGNOSTIC;  Surgeon: Stark Klein, MD;  Location: [facility];  Service: General;  Laterality: N/A;    Current Outpatient Medications  Medication Sig Dispense Refill  . calcium carbonate (TUMS - DOSED IN MG ELEMENTAL CALCIUM) 500 MG chewable tablet Chew 1 tablet by mouth daily.    . clopidogrel (PLAVIX) 75 MG tablet Take 1 tablet (75 mg total) by mouth daily.    . diclofenac sodium (VOLTAREN) 1 % GEL Apply 4 g topically 4 (four) times daily. For knee pain 100 g 11  . esomeprazole (NEXIUM) 40 MG capsule TAKE 1 CAPSULE DAILY AT    NOON 30 capsule 8  . glucose blood (ONETOUCH VERIO) test strip Used to check blood two sugars times daily.E11.9. 100 each 12  . insulin aspart protamine - aspart (NOVOLOG MIX 70/30 FLEXPEN) (70-30) 100 UNIT/ML FlexPen 20 units with breakfast, and 6 units with supper, and pen needles 2/day 10 pen 2  . mirtazapine (REMERON) 15 MG tablet TAKE 1 TABLET BY MOUTH AT BEDTIME AS NEEDED 30 tablet 3   No current facility-administered medications for  this visit.     Family History  Problem Relation Age of Onset  . Cancer Father        Prostate Cancer  . Cancer Sister        Breast Cancer  . Diabetes Sister   . Diabetes Sister   . Other Mother        pneumonia   . Cancer Sister        breast cancer     Review of Systems  Constitutional: Negative.   HENT: Negative.   Eyes: Negative.   Respiratory: Negative.   Cardiovascular: Negative.   Gastrointestinal: Negative.   Endocrine: Negative.   Genitourinary: Positive for frequency.        Bladder prolapse  Musculoskeletal: Negative.   Skin: Negative.   Allergic/Immunologic: Negative.   Neurological: Negative.   Hematological: Negative.   Psychiatric/Behavioral: Negative.     Exam:   BP (!) 148/80 (BP Location: Right Arm, Patient Position: Sitting, Cuff Size: Normal)   Pulse 80   Ht 5\' 1"  (1.549 m)   Wt 127 lb 3.2 oz (57.7 kg)   BMI 24.03 kg/m   Weight change: @WEIGHTCHANGE @ Height:   Height: 5\' 1"  (154.9 cm)  Ht Readings from Last 3 Encounters:  08/25/18 5\' 1"  (1.549 m)  08/24/18 5\' 1"  (1.549 m)  08/13/18 5\' 1"  (1.549 m)    General appearance: alert, cooperative and appears stated age Abdomen: soft, non-tender; non distended,  no masses,  no organomegaly   Pelvic: External genitalia:  no lesions              Urethra:  normal appearing urethra with no masses, tenderness or lesions              Bartholins and Skenes: normal                 Vagina: normal appearing vagina with normal color and discharge, no lesions  She has a moderate grade 2 cystocele with hard valsalva, grade one uterine prolapse (only examined supine with and without valsalva)              Cervix: no lesions               Bimanual Exam:  Uterus:  normal size, contour, position, consistency, mobility, non-tender              Adnexa: no mass, fullness, tenderness                 Chaperone was present for exam.  A:  Grade 2 cystocele, not symptomatic, no urinary c/o Grade 1 uterine prolapse  P:   Patient reassured, reviewed prolapse, given ACOG handout with diagrams  Unless she is symptomatic no treatment is needed  Avoid heavy lifing and straining

## 2018-08-25 NOTE — Patient Instructions (Signed)

## 2018-08-25 NOTE — Telephone Encounter (Signed)
Scheduled appt per 2/10 f/u reminder letter sent in the mail - f/u in 6 months.

## 2018-09-16 ENCOUNTER — Telehealth: Payer: Self-pay | Admitting: Endocrinology

## 2018-09-16 NOTE — Telephone Encounter (Signed)
error 

## 2018-09-18 ENCOUNTER — Telehealth: Payer: Self-pay | Admitting: Family Medicine

## 2018-09-18 NOTE — Telephone Encounter (Signed)
Copied from [place] [id number]. Topic: General - Other >> Sep 18, 2018 12:20 PM Yvette Rack wrote: Reason for CRM: Leanna Sato with [facility] called to check the status of the medical necessity form that was faxed on 09/16/18. Requests call back. Cb# [phone]

## 2018-09-23 ENCOUNTER — Telehealth: Payer: Self-pay

## 2018-09-23 NOTE — Telephone Encounter (Signed)
Pt called back. °

## 2018-09-23 NOTE — Telephone Encounter (Signed)
See note

## 2018-09-23 NOTE — Telephone Encounter (Signed)
Called and spoke with patient's husband in regards to request that we r/c from [facility] in [place] for compression stockings.  Husband stated that he did not think that his wife has ever worn these nor did he recall that they have ever dealt with a company by the name of [employer] previously.  Patient was not available at the time of call but husband stated that he would have his wife return my call when she returned.

## 2018-09-23 NOTE — Telephone Encounter (Signed)
Called and spoke with patient.  She has never heard of [employer] and does not use compression stockings.

## 2018-09-23 NOTE — Telephone Encounter (Signed)
No known hx of venous stasis ulcers or chronic leg edema. This is not medically necessary. Will not sign.   jen-please call [place] and see if she actually requested this.

## 2018-09-23 NOTE — Telephone Encounter (Signed)
After calling to speak with patient who confirmed that she has never heard of [employer] and does not use compression stockings, no need to contact this company back; determined to be a scam.

## 2018-09-25 ENCOUNTER — Telehealth: Payer: Self-pay

## 2018-09-25 NOTE — Telephone Encounter (Signed)
Spoke with the pt. Pt sts that she is doing well from a cardiac standpoint. Appt wit Dr.Acharya rescheduled for 11/05/18 @ 11am. Pt aware of appt date, time, and location. Adv pt to contact the office sooner if any questions or concerns. Pt verbalized understanding and voiced appreciation for the call.

## 2018-09-25 NOTE — Telephone Encounter (Signed)
Per Dr.Acharya if the patient is dong ok from a cardiac standpoint. We should reschedule her appt scheduled for 10/05/18 to May 2020. Attempted to contact the patient x3. Unable to lmom. Pt phone line busy. Will attempt again.

## 2018-09-28 ENCOUNTER — Telehealth: Payer: Self-pay | Admitting: Family Medicine

## 2018-09-28 NOTE — Telephone Encounter (Signed)
Copied from [place] [id number]. Topic: Quick Communication - Rx Refill/Question >> Sep 28, 2018 11:05 AM Antonieta Iba C wrote: Medication: clopidogrel (PLAVIX) 75 MG tablet  Has the patient contacted their pharmacy? Yes  (Agent: If no, request that the patient contact the pharmacy for the refill.) (Agent: If yes, when and what did the pharmacy advise?)  Preferred Pharmacy (with phone number or street name): [place], [address] [phone] (Phone) [phone] (Fax)    Agent: Please be advised that RX refills may take up to 3 business days. We ask that you follow-up with your pharmacy.

## 2018-10-01 ENCOUNTER — Other Ambulatory Visit: Payer: Self-pay | Admitting: Family Medicine

## 2018-10-01 MED ORDER — CLOPIDOGREL BISULFATE 75 MG PO TABS: 75.0000 mg | ORAL_TABLET | Freq: Every day | ORAL | 1 refills | Status: DC

## 2018-10-01 NOTE — Telephone Encounter (Signed)
Never have gotten a request for this. likely sent to her old PCP. Sent this in for her today.

## 2018-10-01 NOTE — Telephone Encounter (Signed)
See note

## 2018-10-01 NOTE — Telephone Encounter (Signed)
Pt calling to check on this.  States pharmacy hasn't received anything and she is completely out of her medication.

## 2018-10-05 ENCOUNTER — Ambulatory Visit: Payer: Medicare Other | Admitting: Internal Medicine

## 2018-10-13 ENCOUNTER — Other Ambulatory Visit: Payer: Self-pay | Admitting: Endocrinology

## 2018-10-16 ENCOUNTER — Telehealth: Payer: Self-pay

## 2018-10-16 NOTE — Telephone Encounter (Signed)
   Primary Cardiologist:  Elouise Munroe, MD   Patient contacted.  History reviewed.  No symptoms to suggest any unstable cardiac conditions.  Based on discussion, with current pandemic situation, we will be postponing this appointment for Tammy Hughes with a plan for f/u in 7 wks or sooner if feasible/necessary.  If symptoms change, she has been instructed to contact our office.    Crissie Reese, CMA  10/16/2018 3:42 PM         .

## 2018-10-22 ENCOUNTER — Other Ambulatory Visit: Payer: Self-pay | Admitting: Endocrinology

## 2018-11-05 ENCOUNTER — Ambulatory Visit: Payer: Medicare Other | Admitting: Internal Medicine

## 2018-11-26 ENCOUNTER — Telehealth: Payer: Self-pay | Admitting: Family Medicine

## 2018-11-26 ENCOUNTER — Other Ambulatory Visit: Payer: Self-pay

## 2018-11-26 MED ORDER — ESOMEPRAZOLE MAGNESIUM 40 MG PO CPDR: DELAYED_RELEASE_CAPSULE | ORAL | 8 refills | Status: DC

## 2018-11-26 NOTE — Telephone Encounter (Signed)
Copied from [place]. Topic: General - Other >> Nov 26, 2018 12:18 PM Lennox Solders wrote: Reason for CRM:pt needs refill from dr Rogers Blocker esomeprazole 40 mg #30 w/refills. This med was last prescribed by dr Loanne Drilling. [facility]

## 2018-11-26 NOTE — Telephone Encounter (Signed)
Refill for Nexxium sent to [facility]

## 2018-11-26 NOTE — Telephone Encounter (Signed)
See request °

## 2018-11-27 ENCOUNTER — Encounter: Payer: Self-pay | Admitting: Endocrinology

## 2018-11-30 ENCOUNTER — Other Ambulatory Visit: Payer: Self-pay

## 2018-11-30 ENCOUNTER — Ambulatory Visit (INDEPENDENT_AMBULATORY_CARE_PROVIDER_SITE_OTHER): Payer: Medicare Other | Admitting: Endocrinology

## 2018-11-30 DIAGNOSIS — Z794 Long term (current) use of insulin: Secondary | ICD-10-CM | POA: Diagnosis not present

## 2018-11-30 DIAGNOSIS — E1142 Type 2 diabetes mellitus with diabetic polyneuropathy: Secondary | ICD-10-CM | POA: Diagnosis not present

## 2018-11-30 DIAGNOSIS — E119 Type 2 diabetes mellitus without complications: Secondary | ICD-10-CM | POA: Diagnosis not present

## 2018-11-30 MED ORDER — INSULIN ASPART PROT & ASPART (70-30 MIX) 100 UNIT/ML PEN: PEN_INJECTOR | SUBCUTANEOUS | 2 refills | Status: DC

## 2018-11-30 NOTE — Progress Notes (Signed)
Subjective:    Patient ID: Tammy Hughes, female    DOB: 1933/06/27, 85 y.o.   MRN: [id number]  HPI telehealth visit today via Phone x 7 minutes Alternatives to telehealth are presented to this patient, and the patient agrees to the telehealth visit. Pt is advised of the cost of the visit, and agrees to this, also.   Patient is at home, and I am at the office.   Persons attending the telehealth visit: the patient and I Pt returns for f/u of diabetes mellitus:  DM type: Insulin-requiring type 2.  Dx'ed: 2979.   Complications: polyneuropathy and CAD.  Therapy: insulin since 2005.  GDM: never.   DKA: never.   Severe hypoglycemia: never.   Pancreatitis: never.  Other: she chose bid premixed insulin.   Interval history: she brings a record of her cbg's which I have reviewed today.  it varies from 161-600.  There is no trend throughout the day.  pt states she feels well in general. Past Medical History:  Diagnosis Date  . Allergy    SEASONAL  . ANXIETY 03/08/2008  . Cataract    REMOVED BILATERAL  . Depression   . DIVERTICULOSIS, COLON 03/08/2008  . DVT (deep venous thrombosis) ([place])   . GERD 02/23/2007  . HYPERCHOLESTEROLEMIA 02/01/2008  . HYPERTENSION 03/08/2008  . Insulin dependent diabetes mellitus ([place])   . OSTEOARTHRITIS 02/23/2007  . OSTEOPOROSIS 02/23/2007  . PERIPHERAL NEUROPATHY 02/23/2007  . STD (sexually transmitted disease)   . Stomach cancer ([place]) 07/20/14   invasive adenocarcinoma w/signet rings features  . Urinary incontinence   . Urolithiasis     Past Surgical History:  Procedure Laterality Date  . ABDOMINAL SURGERY    . CARDIAC CATHETERIZATION N/A 02/02/2016   Procedure: Left Heart Cath and Coronary Angiography;  Surgeon: Peter M Martinique, MD;  Location: [facility];  Service: Cardiovascular;  Laterality: N/A;  . CHOLECYSTECTOMY  1995  . ESOPHAGOGASTRODUODENOSCOPY  06/08/2002  . GASTRECTOMY N/A 07/20/2014   Procedure: PROXIMAL GASTRECTOMY;  Surgeon: Stark Klein, MD;  Location: [facility];  Service: General;  Laterality: N/A;  . GASTROJEJUNOSTOMY N/A 07/20/2014   Procedure: GASTROJEJUNOSTOMY;  Surgeon: Stark Klein, MD;  Location: [facility];  Service: General;  Laterality: N/A;  . LAPAROSCOPY N/A 07/20/2014   Procedure: LAPAROSCOPY DIAGNOSTIC;  Surgeon: Stark Klein, MD;  Location: [facility];  Service: General;  Laterality: N/A;    Social History   Socioeconomic History  . Marital status: Married    Spouse name: Not on file  . Number of children: Not on file  . Years of education: Not on file  . Highest education level: Not on file  Occupational History  . Occupation: Retired    Fish farm manager: RETIRED  Social Needs  . Financial resource strain: Not on file  . Food insecurity:    Worry: Not on file    Inability: Not on file  . Transportation needs:    Medical: Not on file    Non-medical: Not on file  Tobacco Use  . Smoking status: Never Smoker  . Smokeless tobacco: Never Used  Substance and Sexual Activity  . Alcohol use: No    Alcohol/week: 0.0 standard drinks  . Drug use: No  . Sexual activity: Not Currently    Birth control/protection: Post-menopausal  Lifestyle  . Physical activity:    Days per week: Not on file    Minutes per session: Not on file  . Stress: Not on file  Relationships  . Social connections:  Talks on phone: Not on file    Gets together: Not on file    Attends religious service: Not on file    Active member of club or organization: Not on file    Attends meetings of clubs or organizations: Not on file    Relationship status: Not on file  . Intimate partner violence:    Fear of current or ex partner: Not on file    Emotionally abused: Not on file    Physically abused: Not on file    Forced sexual activity: Not on file  Other Topics Concern  . Not on file  Social History Narrative   Dentist       Current Outpatient Medications on File Prior to Visit  Medication Sig Dispense Refill  .  calcium carbonate (TUMS - DOSED IN MG ELEMENTAL CALCIUM) 500 MG chewable tablet Chew 1 tablet by mouth daily.    . clopidogrel (PLAVIX) 75 MG tablet Take 1 tablet (75 mg total) by mouth daily. 90 tablet 1  . diclofenac sodium (VOLTAREN) 1 % GEL Apply 4 g topically 4 (four) times daily. For knee pain 100 g 11  . esomeprazole (NEXIUM) 40 MG capsule TAKE 1 CAPSULE DAILY AT    NOON 30 capsule 8  . mirtazapine (REMERON) 15 MG tablet TAKE 1 TABLET BY MOUTH EACH NIGHT AT BEDTIME IF NEEDED 30 tablet 3  . ONETOUCH VERIO test strip USE 1 STRIP TO CHECK GLUCOSE TWICE DAILY 100 each 0   No current facility-administered medications on file prior to visit.     Allergies  Allergen Reactions  . Pirfenidone Diarrhea and Nausea And Vomiting  . Aspirin     rash    Family History  Problem Relation Age of Onset  . Cancer Father        Prostate Cancer  . Cancer Sister        Breast Cancer  . Diabetes Sister   . Diabetes Sister   . Other Mother        pneumonia   . Cancer Sister        breast cancer      Review of Systems She denies hypoglycemia.      Objective:   Physical Exam    Lab Results  Component Value Date   HGBA1C 6.9 (A) 08/03/2018       Assessment & Plan:  Insulin-requiring type 2 DM, with PAD: worse.  We discussed.  She declines a1c now. CAD: in this context, she should avoid hypoglycemia, so we'll increase insulin slowly  Patient Instructions  Please increase the insulin to 22 units with breakfast, and 6 units with supper.   Please come back in 2-3 weeks, to have the a1c checked.   check your blood sugar twice a day.  vary the time of day when you check, between before the 3 meals, and at bedtime.  also check if you have symptoms of your blood sugar being too high or too low.  please keep a record of the readings and bring it to your next appointment here (or you can bring the meter itself).  You can write it on any piece of paper.  please call us sooner if your blood sugar  goes below 70, or if you have a lot of readings over 200.   Please come back for a follow-up appointment in 3 months.

## 2018-11-30 NOTE — Patient Instructions (Addendum)
Please increase the insulin to 22 units with breakfast, and 6 units with supper.   Please come back in 2-3 weeks, to have the a1c checked.   check your blood sugar twice a day.  vary the time of day when you check, between before the 3 meals, and at bedtime.  also check if you have symptoms of your blood sugar being too high or too low.  please keep a record of the readings and bring it to your next appointment here (or you can bring the meter itself).  You can write it on any piece of paper.  please call us sooner if your blood sugar goes below 70, or if you have a lot of readings over 200.   Please come back for a follow-up appointment in 3 months.

## 2018-12-08 ENCOUNTER — Other Ambulatory Visit: Payer: Self-pay | Admitting: Endocrinology

## 2018-12-08 MED ORDER — GLUCOSE BLOOD VI STRP: ORAL_STRIP | 0 refills | Status: DC

## 2018-12-08 NOTE — Addendum Note (Signed)
Addended by: Virl Cagey on: 12/08/2018 03:36 PM   Modules accepted: Orders

## 2018-12-22 ENCOUNTER — Ambulatory Visit (INDEPENDENT_AMBULATORY_CARE_PROVIDER_SITE_OTHER): Payer: Medicare Other

## 2018-12-22 ENCOUNTER — Other Ambulatory Visit: Payer: Self-pay

## 2018-12-22 DIAGNOSIS — Z794 Long term (current) use of insulin: Secondary | ICD-10-CM | POA: Diagnosis not present

## 2018-12-22 DIAGNOSIS — E119 Type 2 diabetes mellitus without complications: Secondary | ICD-10-CM

## 2018-12-22 LAB — POCT GLYCOSYLATED HEMOGLOBIN (HGB A1C): Hemoglobin A1C: 7.9 % — AB (ref 4.0–5.6)

## 2018-12-22 NOTE — Progress Notes (Signed)
Patient here for A1c. 

## 2018-12-23 ENCOUNTER — Telehealth: Payer: Self-pay | Admitting: Endocrinology

## 2018-12-23 NOTE — Telephone Encounter (Signed)
Patient states she is returning Dr. Cordelia Pen call. Patient can be reached at ph# [phone].

## 2018-12-23 NOTE — Telephone Encounter (Signed)
See result note. Nothing further needed.  

## 2018-12-30 ENCOUNTER — Telehealth: Payer: Self-pay | Admitting: *Deleted

## 2018-12-30 NOTE — Telephone Encounter (Signed)
   TELEPHONE CALL NOTE  This patient has been deemed a candidate for follow-up tele-health visit to limit community exposure during the Covid-19 pandemic. I spoke with the patient via phone to discuss instructions. . The patient will receive a phone call 2-3 days prior to their E-Visit at which time consent will be verbally confirmed.   A Virtual Office Visit appointment type has been scheduled for 6/24 with [facility], with  "TELEPHONE"      Raiford Simmonds, RN 12/30/2018 4:24 PM

## 2018-12-31 ENCOUNTER — Other Ambulatory Visit: Payer: Self-pay

## 2018-12-31 ENCOUNTER — Ambulatory Visit (INDEPENDENT_AMBULATORY_CARE_PROVIDER_SITE_OTHER): Payer: Medicare Other | Admitting: Family Medicine

## 2018-12-31 ENCOUNTER — Encounter: Payer: Self-pay | Admitting: Family Medicine

## 2018-12-31 VITALS — BP 126/70 | HR 62 | Temp 98.0°F | Resp 14 | Ht 61.0 in | Wt 126.8 lb

## 2018-12-31 DIAGNOSIS — M545 Low back pain, unspecified: Secondary | ICD-10-CM

## 2018-12-31 DIAGNOSIS — M47816 Spondylosis without myelopathy or radiculopathy, lumbar region: Secondary | ICD-10-CM | POA: Diagnosis not present

## 2018-12-31 NOTE — Progress Notes (Signed)
Subjective  CC:  Chief Complaint  Patient presents with  . Back Pain    Present for several months on/off, got worse over the past 2 weeks. Pain is located in the middle of the back, changing position is painful. She has tried Tylenol with minimal relief.. Denies any urination problems or radiating pain   Same day acute visit; PCP not available. New pt to me. Chart reviewed.   HPI: Tammy Hughes is a 85 y.o. female who presents to the office today to address the problems listed above in the chief complaint.  85 year old fairly healthy female who has history of intermittent chronic lumbar pain complains of worsening midline low back pain.  I reviewed chart, she did have x-rays in 2016 showing a lumbar facet arthropathy at that time.  She reports that she has mild pain worse with bending forward.  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  She denies injuries.  No bowel or bladder dysfunction.  No radicular symptoms.  She has used Tylenol at night over the last couple nights and that has helped.  Pain does not interfere with sleep.  Her gait is normal.  No lower extremity weakness. Assessment  1. Lumbar facet arthropathy   2. Acute midline low back pain without sciatica      Plan   Osteoarthritis and lumbar pain, mild flare likely due to overuse: Educated.  See after visit summary.  Tylenol up to 3 times a day.  Rest and stretching exercises.  Follow-up if unimproved.  No red flags identified today.  Follow up: As needed Visit date not found  No orders of the defined types were placed in this encounter.  No orders of the defined types were placed in this encounter.     I reviewed the patients updated PMH, FH, and SocHx.    Patient Active Problem List   Diagnosis Date Noted  . Genital herpes 08/19/2018  . CAD (coronary artery disease) 08/13/2018  . Depression 08/13/2018  . TIA (transient ischemic attack) 08/13/2018  .  Abnormal nuclear stress test   . Normocytic anemia 02/01/2016  . Memory loss 08/02/2015  . DVT (deep venous thrombosis) ([place]) 10/27/2014  . Rash 10/25/2014  . Abdominal pain 08/30/2014  . Type 2 diabetes mellitus with peripheral neuropathy ([place]) 08/04/2014  . NSVT post-op 07/24/2014  . Gastric cancer-s/p gastrectomy 07/20/14 06/15/2014  . Postinflammatory pulmonary fibrosis ([place]) 08/06/2013  . NASH (nonalcoholic steatohepatitis) 10/21/2011  . Carpal tunnel syndrome of left wrist 11/23/2010  . BACK PAIN, LUMBAR 12/19/2009  . LEUKOPENIA, MILD 02/15/2009  . Anxiety state 03/08/2008  . Essential hypertension 03/08/2008  . Non-allergic rhinitis 03/08/2008  . DIVERTICULOSIS, COLON 03/08/2008  . HYPERCHOLESTEROLEMIA 02/01/2008  . GERD 02/23/2007  . Osteoarthritis 02/23/2007  . Osteoporosis 02/23/2007   Current Meds  Medication Sig  . calcium carbonate (TUMS - DOSED IN MG ELEMENTAL CALCIUM) 500 MG chewable tablet Chew 1 tablet by mouth daily.  . clopidogrel (PLAVIX) 75 MG tablet Take 1 tablet (75 mg total) by mouth daily.  . diclofenac sodium (VOLTAREN) 1 % GEL Apply 4 g topically 4 (four) times daily. For knee pain  . esomeprazole (NEXIUM) 40 MG capsule TAKE 1 CAPSULE DAILY AT    NOON  . glucose blood (ONETOUCH VERIO) test strip USE 1 STRIP TO CHECK GLUCOSE TWICE DAILY  . insulin aspart protamine - aspart (NOVOLOG MIX 70/30 FLEXPEN) (70-30) 100 UNIT/ML FlexPen 22 units with breakfast,  and 6 units with supper, and pen needles 2/day  . mirtazapine (REMERON) 15 MG tablet TAKE 1 TABLET BY MOUTH EACH NIGHT AT BEDTIME IF NEEDED    Allergies: Patient is allergic to pirfenidone and aspirin. Family History: Patient family history includes Cancer in her father, sister, and sister; Diabetes in her sister and sister; Other in her mother. Social History:  Patient  reports that she has never smoked. She has never used smokeless tobacco. She reports that she does not drink alcohol or use drugs.   Review of Systems: Constitutional: Negative for fever malaise or anorexia Cardiovascular: negative for chest pain Respiratory: negative for SOB or persistent cough Gastrointestinal: negative for abdominal pain  Objective  Vitals: BP 126/70   Pulse 62   Temp 98 F (36.7 C) (Oral)   Resp 14   Ht 5\' 1"  (1.549 m)   Wt 126 lb 12.8 oz (57.5 kg)   SpO2 97%   BMI 23.96 kg/m  General: no acute distress , A&Ox3, normal gait Back: Midline lumbar tenderness, no muscle spasm, good range of motion, negative straight leg raise bilaterally, +2 knee reflexes bilaterally normal strength    Commons side effects, risks, benefits, and alternatives for medications and treatment plan prescribed today were discussed, and the patient expressed understanding of the given instructions. Patient is instructed to call or message via MyChart if he/she has any questions or concerns regarding our treatment plan. No barriers to understanding were identified. We discussed Red Flag symptoms and signs in detail. Patient expressed understanding regarding what to do in case of urgent or emergency type symptoms.   Medication list was reconciled, printed and provided to the patient in AVS. Patient instructions and summary information was reviewed with the patient as documented in the AVS. This note was prepared with assistance of Dragon voice recognition software. Occasional wrong-word or sound-a-like substitutions may have occurred due to the inherent limitations of voice recognition software

## 2018-12-31 NOTE — Patient Instructions (Signed)
Please follow up if symptoms do not improve or as needed.   You may start Tylenol ES 500mg  1-3x/ day as needed for your back pain.   Start the gently exercises that we discussed today.   Rest: no volunteering at the [employer] for the next 1-2 weeks.   Let me know if you do not start feeling better.

## 2019-01-01 ENCOUNTER — Telehealth: Payer: Self-pay | Admitting: Internal Medicine

## 2019-01-01 NOTE — Telephone Encounter (Signed)
Mychart pending, no smartphone, consent, pre reg complete 01/01/19 AF °

## 2019-01-06 ENCOUNTER — Telehealth (INDEPENDENT_AMBULATORY_CARE_PROVIDER_SITE_OTHER): Payer: Medicare Other | Admitting: Internal Medicine

## 2019-01-06 ENCOUNTER — Encounter: Payer: Self-pay | Admitting: Internal Medicine

## 2019-01-06 VITALS — BP 122/69 | HR 69 | Ht 60.0 in | Wt 126.0 lb

## 2019-01-06 DIAGNOSIS — Z8673 Personal history of transient ischemic attack (TIA), and cerebral infarction without residual deficits: Secondary | ICD-10-CM

## 2019-01-06 DIAGNOSIS — R5383 Other fatigue: Secondary | ICD-10-CM

## 2019-01-06 DIAGNOSIS — I10 Essential (primary) hypertension: Secondary | ICD-10-CM | POA: Diagnosis not present

## 2019-01-06 DIAGNOSIS — I472 Ventricular tachycardia: Secondary | ICD-10-CM | POA: Diagnosis not present

## 2019-01-06 DIAGNOSIS — R001 Bradycardia, unspecified: Secondary | ICD-10-CM | POA: Diagnosis not present

## 2019-01-06 DIAGNOSIS — I4729 Other ventricular tachycardia: Secondary | ICD-10-CM

## 2019-01-06 NOTE — Patient Instructions (Addendum)
Medication Instructions:  Your physician recommends that you continue on your current medications as directed. Please refer to the Current Medication list given to you today.  If you need a refill on your cardiac medications before your next appointment, please call your pharmacy.   Lab work: NONE   Testing/Procedures: NONE  Follow-Up: At [facility], you and your health needs are our priority.  As part of our continuing mission to provide you with exceptional heart care, we have created designated Provider Care Teams.  These Care Teams include your primary Cardiologist (physician) and Advanced Practice Providers (APPs -  Physician Assistants and Nurse Practitioners) who all work together to provide you with the care you need, when you need it. You will need a follow up appointment in 6 months.  Please call our office 2 months in advance to schedule this appointment.  You may see Elouise Munroe, MD or one of the following Advanced Practice Providers on your designated Care Team:   Rosaria Ferries, PA-C . Jory Sims, DNP, ANP

## 2019-01-06 NOTE — Progress Notes (Signed)
Virtual Visit via Telephone Note   This visit type was conducted due to national recommendations for restrictions regarding the COVID-19 Pandemic (e.g. social distancing) in an effort to limit this patient's exposure and mitigate transmission in our community.  Due to her co-morbid illnesses, this patient is at least at moderate risk for complications without adequate follow up.  This format is felt to be most appropriate for this patient at this time.  The patient did not have access to video technology/had technical difficulties with video requiring transitioning to audio format only (telephone).  All issues noted in this document were discussed and addressed.  No physical exam could be performed with this format.  Please refer to the patient's chart for her  consent to telehealth for [facility].   Date:  01/06/2019   ID:  Tammy Hughes, DOB 04-Jul-1933, MRN [id number]  Patient Location: Home Provider Location: Office  PCP:  Orma Flaming, MD  Cardiologist:  Elouise Munroe, MD  Electrophysiologist:  None   Evaluation Performed:  Follow-Up Visit  Chief Complaint:  F/u HTN and fatigue  History of Present Illness:    Tammy Hughes is a 85 y.o. female with  a hx of diabetes requiring insulin, hyperlipidemia, and prior gastric adenocarcinoma, who presents today for follow up.  She feels well and has no specific concerns. She felt better after we stopped her beta blocker at the last visit. The patient denies chest pain, chest pressure, dyspnea at rest or with exertion, palpitations, PND, orthopnea, or leg swelling. Denies syncope or presyncope. Denies dizziness or lightheadedness.   The patient does not have symptoms concerning for COVID-19 infection (fever, chills, cough, or new shortness of breath).    Past Medical History:  Diagnosis Date  . Allergy    SEASONAL  . ANXIETY 03/08/2008  . Cataract    REMOVED BILATERAL  . Depression   . DIVERTICULOSIS, COLON 03/08/2008  . DVT  (deep venous thrombosis) ([place])   . GERD 02/23/2007  . HYPERCHOLESTEROLEMIA 02/01/2008  . HYPERTENSION 03/08/2008  . Insulin dependent diabetes mellitus ([place])   . OSTEOARTHRITIS 02/23/2007  . OSTEOPOROSIS 02/23/2007  . PERIPHERAL NEUROPATHY 02/23/2007  . STD (sexually transmitted disease)   . Stomach cancer ([place]) 07/20/14   invasive adenocarcinoma w/signet rings features  . Urinary incontinence   . Urolithiasis    Past Surgical History:  Procedure Laterality Date  . ABDOMINAL SURGERY    . CARDIAC CATHETERIZATION N/A 02/02/2016   Procedure: Left Heart Cath and Coronary Angiography;  Surgeon: Peter M Martinique, MD;  Location: [facility];  Service: Cardiovascular;  Laterality: N/A;  . CHOLECYSTECTOMY  1995  . ESOPHAGOGASTRODUODENOSCOPY  06/08/2002  . GASTRECTOMY N/A 07/20/2014   Procedure: PROXIMAL GASTRECTOMY;  Surgeon: Stark Klein, MD;  Location: [place];  Service: General;  Laterality: N/A;  . GASTROJEJUNOSTOMY N/A 07/20/2014   Procedure: GASTROJEJUNOSTOMY;  Surgeon: Stark Klein, MD;  Location: [place];  Service: General;  Laterality: N/A;  . LAPAROSCOPY N/A 07/20/2014   Procedure: LAPAROSCOPY DIAGNOSTIC;  Surgeon: Stark Klein, MD;  Location: [place];  Service: General;  Laterality: N/A;     Current Meds  Medication Sig  . Acetaminophen (TYLENOL PO) Take by mouth.  . calcium carbonate (TUMS - DOSED IN MG ELEMENTAL CALCIUM) 500 MG chewable tablet Chew 1 tablet by mouth daily.  . clopidogrel (PLAVIX) 75 MG tablet Take 1 tablet (75 mg total) by mouth daily.  . diclofenac sodium (VOLTAREN) 1 % GEL Apply 4 g topically 4 (four)  times daily. For knee pain  . esomeprazole (NEXIUM) 40 MG capsule TAKE 1 CAPSULE DAILY AT    NOON  . glucose blood (ONETOUCH VERIO) test strip USE 1 STRIP TO CHECK GLUCOSE TWICE DAILY  . insulin aspart protamine - aspart (NOVOLOG MIX 70/30 FLEXPEN) (70-30) 100 UNIT/ML FlexPen 22 units with breakfast, and 6 units with supper, and pen needles 2/day  . mirtazapine (REMERON) 15  MG tablet TAKE 1 TABLET BY MOUTH EACH NIGHT AT BEDTIME IF NEEDED     Allergies:   Pirfenidone and Aspirin   Social History   Tobacco Use  . Smoking status: Never Smoker  . Smokeless tobacco: Never Used  Substance Use Topics  . Alcohol use: No    Alcohol/week: 0.0 standard drinks  . Drug use: No     Family Hx: The patient's family history includes Cancer in her father, sister, and sister; Diabetes in her sister and sister; Other in her mother.  ROS:   Please see the history of present illness.     All other systems reviewed and are negative.   Prior CV studies:   The following studies were reviewed today:    Labs/Other Tests and Data Reviewed:    EKG:  No ECG reviewed.  Recent Labs: 08/13/2018: ALT 11; BUN 16; Creatinine, Ser 0.91; Hemoglobin 12.0; Platelets 321.0; Potassium 4.2; Sodium 141   Recent Lipid Panel Lab Results  Component Value Date/Time   CHOL 165 11/21/2017 02:39 PM   TRIG 152.0 (H) 11/21/2017 02:39 PM   HDL 38.80 (L) 11/21/2017 02:39 PM   CHOLHDL 4 11/21/2017 02:39 PM   LDLCALC 96 11/21/2017 02:39 PM   LDLDIRECT 102.9 03/23/2014 10:14 AM    Wt Readings from Last 3 Encounters:  01/06/19 126 lb (57.2 kg)  12/31/18 126 lb 12.8 oz (57.5 kg)  08/25/18 127 lb 3.2 oz (57.7 kg)     Objective:    Vital Signs:  BP 122/69   Pulse 69   Ht 5' (1.524 m)   Wt 126 lb (57.2 kg)   BMI 24.61 kg/m    VITAL SIGNS:  reviewed GEN:  no acute distress  ASSESSMENT & PLAN:    1. Essential hypertension   2. Fatigue, unspecified type   3. NSVT post-op   4. Hx of transient ischemic attack (TIA)   5. Sinus bradycardia, resolved    HTN - stable, well controlled on no therapy  Fatigue- continues despite normalization of HR and removal of beta blocker. Have asked her to address this with PCP.   COVID-19 Education: The signs and symptoms of COVID-19 were discussed with the patient and how to seek care for testing (follow up with PCP or arrange E-visit).  The  importance of social distancing was discussed today.  Time:   Today, I have spent 15 minutes with the patient with telehealth technology discussing the above problems.     Medication Adjustments/Labs and Tests Ordered: Current medicines are reviewed at length with the patient today.  Concerns regarding medicines are outlined above.   Tests Ordered: No orders of the defined types were placed in this encounter.   Medication Changes: No orders of the defined types were placed in this encounter.   Follow Up:  6 mo  Signed, Elouise Munroe, MD  01/06/2019 8:40 PM    [facility]

## 2019-01-18 ENCOUNTER — Telehealth: Payer: Self-pay | Admitting: Internal Medicine

## 2019-01-18 NOTE — Telephone Encounter (Signed)
Spoke with pt who states she had CP 2 hours prior to calling [facility] office. Described CP as dull, burning to under left breast radiating to left arm. She states this was after eating steak that she doesn't think she chewed up well. Per pt, she drank some Pepsi that helped her belch and relieve the pain a bit. Per pt, the pain is more significant when lying down or moving around and if she does these things it 'hurts through [her] left breast to [her] back'. She rated that pain at 3-4 out of 10. She states she is still having dull, burning CP but it is now above left breast with no radiation to L arm; no other symptoms. She rates it as 4-5 out of 10. Pt confirms that she has Hx GERD and gastric cancer and that she takes daily Tums and daily Nexium but has not yet taken her Tums. Advised pt to take her Tums while on the phone and then nurse would call back in 30 min to check on pt.  Called pt back. She states she feels better but rates pain at 6-7 out of 10. Pt states she does not have a GI doctor. Pt scheduled for available appt 7/8 at 10:20am with Dr. Margaretann Loveless. Advised her to contact her PCP office tomorrow morning to inform them of her symptoms today and for any recommendations. Instructed pt to call [facility] office to update afterward to determine if pt should still keep appt with Dr. Margaretann Loveless. Advised pt to wait a couple hours then take an additional Tums. Advised her of MI symptoms to monitor for and that if these present along with unresolved CP or increase in severity of CP she should call 911. Pt verbalized understanding

## 2019-01-18 NOTE — Telephone Encounter (Signed)
New message   Pt c/o of Chest Pain: STAT if CP now or developed within 24 hours  1. Are you having CP right now? No   2. Are you experiencing any other symptoms (ex. SOB, nausea, vomiting, sweating)? No   3. How long have you been experiencing CP? Patient had pain underneath her breast about 2 hours ago   4. Is your CP continuous or coming and going? Coming and going  5. Have you taken Nitroglycerin? No  ?

## 2019-01-19 ENCOUNTER — Ambulatory Visit: Payer: Self-pay

## 2019-01-19 DIAGNOSIS — I517 Cardiomegaly: Secondary | ICD-10-CM | POA: Diagnosis not present

## 2019-01-19 DIAGNOSIS — R079 Chest pain, unspecified: Secondary | ICD-10-CM | POA: Diagnosis not present

## 2019-01-19 DIAGNOSIS — K219 Gastro-esophageal reflux disease without esophagitis: Secondary | ICD-10-CM | POA: Diagnosis not present

## 2019-01-19 DIAGNOSIS — N281 Cyst of kidney, acquired: Secondary | ICD-10-CM | POA: Diagnosis not present

## 2019-01-19 DIAGNOSIS — R0989 Other specified symptoms and signs involving the circulatory and respiratory systems: Secondary | ICD-10-CM | POA: Diagnosis not present

## 2019-01-19 DIAGNOSIS — R42 Dizziness and giddiness: Secondary | ICD-10-CM | POA: Diagnosis not present

## 2019-01-19 DIAGNOSIS — I10 Essential (primary) hypertension: Secondary | ICD-10-CM | POA: Diagnosis not present

## 2019-01-19 DIAGNOSIS — Z8673 Personal history of transient ischemic attack (TIA), and cerebral infarction without residual deficits: Secondary | ICD-10-CM | POA: Diagnosis not present

## 2019-01-19 DIAGNOSIS — Z794 Long term (current) use of insulin: Secondary | ICD-10-CM | POA: Diagnosis not present

## 2019-01-19 DIAGNOSIS — R0789 Other chest pain: Secondary | ICD-10-CM | POA: Diagnosis not present

## 2019-01-19 DIAGNOSIS — E119 Type 2 diabetes mellitus without complications: Secondary | ICD-10-CM | POA: Diagnosis not present

## 2019-01-19 DIAGNOSIS — Z79899 Other long term (current) drug therapy: Secondary | ICD-10-CM | POA: Diagnosis not present

## 2019-01-19 DIAGNOSIS — R590 Localized enlarged lymph nodes: Secondary | ICD-10-CM | POA: Diagnosis not present

## 2019-01-19 NOTE — Telephone Encounter (Signed)
Pain under left breast then went to left arm yesterday. Called cardiologist about and Dr stated that it could be related to something she ate. Dr asked her to take Tums to see if could be helpful. Pt thinks it was the seasoning on a steak she ate caused acid reflux. Mild pain this morning, intermittent and worse with movement. Pain radiates to back. Pain goes to 5/10 when having chest pain.  Pt advised to go tot ED now for evaluation. Care advice given and pt verbalized undertanding.  Reason for Disposition . Pain also present in shoulder(s) or arm(s) or jaw  (Exception: pain is clearly made worse by movement)  Answer Assessment - Initial Assessment Questions 1. LOCATION: "Where does it hurt?"       Under left shoulder- to shoulder and left side chest 2. RADIATION: "Does the pain go anywhere else?" (e.g., into neck, jaw, arms, back)     back 3. ONSET: "When did the chest pain begin?" (Minutes, hours or days)      Yesterday 2 pm 4. PATTERN "Does the pain come and go, or has it been constant since it started?"  "Does it get worse with exertion?"      Comes and goes- pain gets worse with movement.  5. DURATION: "How long does it last" (e.g., seconds, minutes, hours)     2 minutes 6. SEVERITY: "How bad is the pain?"  (e.g., Scale 1-10; mild, moderate, or severe)    - MILD (1-3): doesn't interfere with normal activities     - MODERATE (4-7): interferes with normal activities or awakens from sleep    - SEVERE (8-10): excruciating pain, unable to do any normal activities   moderate     7. CARDIAC RISK FACTORS: "Do you have any history of heart problems or risk factors for heart disease?" (e.g., prior heart attack, angina; high blood pressure, diabetes, being overweight, high cholesterol, smoking, or strong family history of heart disease)     Previous heart attack, diabetes,  8. PULMONARY RISK FACTORS: "Do you have any history of lung disease?"  (e.g., blood clots in lung, asthma, emphysema, birth  control pills)     no 9. CAUSE: "What do you think is causing the chest pain?"     GERD 10. OTHER SYMPTOMS: "Do you have any other symptoms?" (e.g., dizziness, nausea, vomiting, sweating, fever, difficulty breathing, cough)       no 11. PREGNANCY: "Is there any chance you are pregnant?" "When was your last menstrual period?"       n/a  Protocols used: CHEST PAIN-A-AH

## 2019-01-19 NOTE — Telephone Encounter (Signed)
Noted  

## 2019-01-20 ENCOUNTER — Encounter: Payer: Self-pay | Admitting: Internal Medicine

## 2019-01-20 ENCOUNTER — Other Ambulatory Visit: Payer: Self-pay

## 2019-01-20 ENCOUNTER — Ambulatory Visit (INDEPENDENT_AMBULATORY_CARE_PROVIDER_SITE_OTHER): Payer: Medicare Other | Admitting: Internal Medicine

## 2019-01-20 VITALS — BP 117/70 | HR 66 | Ht 60.0 in | Wt 125.4 lb

## 2019-01-20 DIAGNOSIS — R0789 Other chest pain: Secondary | ICD-10-CM | POA: Diagnosis not present

## 2019-01-20 DIAGNOSIS — Z8673 Personal history of transient ischemic attack (TIA), and cerebral infarction without residual deficits: Secondary | ICD-10-CM | POA: Diagnosis not present

## 2019-01-20 DIAGNOSIS — I10 Essential (primary) hypertension: Secondary | ICD-10-CM | POA: Diagnosis not present

## 2019-01-20 DIAGNOSIS — R5383 Other fatigue: Secondary | ICD-10-CM

## 2019-01-20 DIAGNOSIS — R079 Chest pain, unspecified: Secondary | ICD-10-CM

## 2019-01-20 NOTE — Patient Instructions (Signed)
Medication Instructions:  Your physician recommends that you continue on your current medications as directed. Please refer to the Current Medication list given to you today.  If you need a refill on your cardiac medications before your next appointment, please call your pharmacy.   Lab work: NONE   Testing/Procedures: Your physician has requested that you have an echocardiogram. Echocardiography is a painless test that uses sound waves to create images of your heart. It provides your doctor with information about the size and shape of your heart and how well your heart's chambers and valves are working. This procedure takes approximately one hour. There are no restrictions for this procedure. [address]  Follow-Up: At [facility], you and your health needs are our priority.  As part of our continuing mission to provide you with exceptional heart care, we have created designated Provider Care Teams.  These Care Teams include your primary Cardiologist (physician) and Advanced Practice Providers (APPs -  Physician Assistants and Nurse Practitioners) who all work together to provide you with the care you need, when you need it. You will need a follow up appointment in 6 months.  Please call our office 2 months in advance to schedule this appointment.  You may see Elouise Munroe, MD or one of the following Advanced Practice Providers on your designated Care Team:   Rosaria Ferries, PA-C . Jory Sims, DNP, ANP  Any Other Special Instructions Will Be Listed Below (If Applicable).   Echocardiogram An echocardiogram is a procedure that uses painless sound waves (ultrasound) to produce an image of the heart. Images from an echocardiogram can provide important information about:  Signs of coronary artery disease (CAD).  Aneurysm detection. An aneurysm is a weak or damaged part of an artery wall that bulges out from the normal force of blood pumping through  the body.  Heart size and shape. Changes in the size or shape of the heart can be associated with certain conditions, including heart failure, aneurysm, and CAD.  Heart muscle function.  Heart valve function.  Signs of a past heart attack.  Fluid buildup around the heart.  Thickening of the heart muscle.  A tumor or infectious growth around the heart valves. Tell a health care provider about:  Any allergies you have.  All medicines you are taking, including vitamins, herbs, eye drops, creams, and over-the-counter medicines.  Any blood disorders you have.  Any surgeries you have had.  Any medical conditions you have.  Whether you are pregnant or may be pregnant. What are the risks? Generally, this is a safe procedure. However, problems may occur, including:  Allergic reaction to dye (contrast) that may be used during the procedure. What happens before the procedure? No specific preparation is needed. You may eat and drink normally. What happens during the procedure?   An IV tube may be inserted into one of your veins.  You may receive contrast through this tube. A contrast is an injection that improves the quality of the pictures from your heart.  A gel will be applied to your chest.  A wand-like tool (transducer) will be moved over your chest. The gel will help to transmit the sound waves from the transducer.  The sound waves will harmlessly bounce off of your heart to allow the heart images to be captured in real-time motion. The images will be recorded on a computer. The procedure may vary among health care providers and hospitals. What happens after  the procedure?  You may return to your normal, everyday life, including diet, activities, and medicines, unless your health care provider tells you not to do that. Summary  An echocardiogram is a procedure that uses painless sound waves (ultrasound) to produce an image of the heart.  Images from an echocardiogram  can provide important information about the size and shape of your heart, heart muscle function, heart valve function, and fluid buildup around your heart.  You do not need to do anything to prepare before this procedure. You may eat and drink normally.  After the echocardiogram is completed, you may return to your normal, everyday life, unless your health care provider tells you not to do that. This information is not intended to replace advice given to you by your health care provider. Make sure you discuss any questions you have with your health care provider. Document Released: 06/28/2000 Document Revised: 10/22/2018 Document Reviewed: 08/03/2016 Elsevier Patient Education  2020 [employer].

## 2019-01-20 NOTE — Progress Notes (Signed)
Cardiology Office Note:    Date:  01/20/2019   ID:  Tammy Hughes, DOB 01-29-1933, MRN [id number]  PCP:  Orma Flaming, MD  Cardiologist:  Elouise Munroe, MD  Electrophysiologist:  None   Referring MD: Orma Flaming, MD   Chief Complaint: episode of chest pain  History of Present Illness:    Tammy Hughes is a 85 y.o. female well known to my clinic. She presents for evaluation of an episode of chest pain.  She had an episode of chest pain yesterday after eating some spicy steak seasoning.  She describes the chest pain as quite intense and requiring her to present to her local hospital.  She has recently been experiencing some emotional stress with her granddaughter choosing to move to [place] and it has been giving her quite a lot to think about.  She states that she had chest pain when she presented to the hospital in [place] yesterday, but does not recall with any medications were given to relieve the chest pain.  It appears that she had a CT angiogram for pulmonary embolism as well as ECG and other testing (these records are not available to me but the patient shared her after visit summary with me.  No test results were reported on this but tests that were performed were documented.)  She recalls being told that everything looked okay.  She had a coronary angiogram in 2017 that documented minimal nonobstructive CAD.  She has never had chest pain like this that she can recall.  She is had an echocardiogram in 2016 which documented normal ejection fraction, no significant valvular heart disease, and RVSP 33 mmHg.  The patient denies dyspnea at rest or with exertion, palpitations, PND, orthopnea, or leg swelling. Denies syncope or presyncope. Denies dizziness or lightheadedness. Denies snoring and has not been evaluated for sleep apnea.  Past Medical History:  Diagnosis Date  . Allergy    SEASONAL  . ANXIETY 03/08/2008  . Cataract    REMOVED BILATERAL  . Depression   . DIVERTICULOSIS,  COLON 03/08/2008  . DVT (deep venous thrombosis) ([place])   . GERD 02/23/2007  . HYPERCHOLESTEROLEMIA 02/01/2008  . HYPERTENSION 03/08/2008  . Insulin dependent diabetes mellitus ([place])   . OSTEOARTHRITIS 02/23/2007  . OSTEOPOROSIS 02/23/2007  . PERIPHERAL NEUROPATHY 02/23/2007  . STD (sexually transmitted disease)   . Stomach cancer ([facility]) 07/20/14   invasive adenocarcinoma w/signet rings features  . Urinary incontinence   . Urolithiasis     Past Surgical History:  Procedure Laterality Date  . ABDOMINAL SURGERY    . CARDIAC CATHETERIZATION N/A 02/02/2016   Procedure: Left Heart Cath and Coronary Angiography;  Surgeon: Peter M Martinique, MD;  Location: [facility];  Service: Cardiovascular;  Laterality: N/A;  . CHOLECYSTECTOMY  1995  . ESOPHAGOGASTRODUODENOSCOPY  06/08/2002  . GASTRECTOMY N/A 07/20/2014   Procedure: PROXIMAL GASTRECTOMY;  Surgeon: Stark Klein, MD;  Location: [facility];  Service: General;  Laterality: N/A;  . GASTROJEJUNOSTOMY N/A 07/20/2014   Procedure: GASTROJEJUNOSTOMY;  Surgeon: Stark Klein, MD;  Location: [facility];  Service: General;  Laterality: N/A;  . LAPAROSCOPY N/A 07/20/2014   Procedure: LAPAROSCOPY DIAGNOSTIC;  Surgeon: Stark Klein, MD;  Location: [facility];  Service: General;  Laterality: N/A;    Current Medications: Current Meds  Medication Sig  . Acetaminophen (TYLENOL PO) Take by mouth.  . calcium carbonate (TUMS - DOSED IN MG ELEMENTAL CALCIUM) 500 MG chewable tablet Chew 1 tablet by mouth daily.  . clopidogrel (PLAVIX)  75 MG tablet Take 1 tablet (75 mg total) by mouth daily.  . diclofenac sodium (VOLTAREN) 1 % GEL Apply 4 g topically 4 (four) times daily. For knee pain  . esomeprazole (NEXIUM) 40 MG capsule TAKE 1 CAPSULE DAILY AT    NOON  . insulin aspart protamine - aspart (NOVOLOG MIX 70/30 FLEXPEN) (70-30) 100 UNIT/ML FlexPen 22 units with breakfast, and 6 units with supper, and pen needles 2/day  . mirtazapine (REMERON) 15 MG tablet TAKE 1 TABLET BY MOUTH EACH  NIGHT AT BEDTIME IF NEEDED  . [DISCONTINUED] glucose blood (ONETOUCH VERIO) test strip USE 1 STRIP TO CHECK GLUCOSE TWICE DAILY     Allergies:   Pirfenidone and Aspirin   Social History   Socioeconomic History  . Marital status: Married    Spouse name: Not on file  . Number of children: Not on file  . Years of education: Not on file  . Highest education level: Not on file  Occupational History  . Occupation: Retired    Fish farm manager: RETIRED  Social Needs  . Financial resource strain: Not on file  . Food insecurity    Worry: Not on file    Inability: Not on file  . Transportation needs    Medical: Not on file    Non-medical: Not on file  Tobacco Use  . Smoking status: Never Smoker  . Smokeless tobacco: Never Used  Substance and Sexual Activity  . Alcohol use: No    Alcohol/week: 0.0 standard drinks  . Drug use: No  . Sexual activity: Not Currently    Birth control/protection: Post-menopausal  Lifestyle  . Physical activity    Days per week: Not on file    Minutes per session: Not on file  . Stress: Not on file  Relationships  . Social Herbalist on phone: Not on file    Gets together: Not on file    Attends religious service: Not on file    Active member of club or organization: Not on file    Attends meetings of clubs or organizations: Not on file    Relationship status: Not on file  Other Topics Concern  . Not on file  Social History Narrative   Dentist        Family History: The patient's family history includes Cancer in her father, sister, and sister; Diabetes in her sister and sister; Other in her mother.  ROS:   Please see the history of present illness.    All other systems reviewed and are negative.  EKGs/Labs/Other Studies Reviewed:    The following studies were reviewed today:  EKG:  NSR  Recent Labs: 08/13/2018: ALT 11; BUN 16; Creatinine, Ser 0.91; Hemoglobin 12.0; Platelets 321.0; Potassium 4.2; Sodium 141   Recent Lipid Panel    Component Value Date/Time   CHOL 165 11/21/2017 1439   TRIG 152.0 (H) 11/21/2017 1439   HDL 38.80 (L) 11/21/2017 1439   CHOLHDL 4 11/21/2017 1439   VLDL 30.4 11/21/2017 1439   LDLCALC 96 11/21/2017 1439   LDLDIRECT 102.9 03/23/2014 1014    Physical Exam:    VS:  BP 117/70   Pulse 66   Ht 5' (1.524 m)   Wt 125 lb 6.4 oz (56.9 kg)   SpO2 97%   BMI 24.49 kg/m     Wt Readings from Last 5 Encounters:  01/20/19 125 lb 6.4 oz (56.9 kg)  01/06/19 126 lb (57.2 kg)  12/31/18 126 lb 12.8 oz (57.5 kg)  08/25/18 127 lb 3.2 oz (57.7 kg)  08/24/18 125 lb 4.8 oz (56.8 kg)     Constitutional: No acute distress Eyes: sclera non-icteric, normal conjunctiva and lids ENMT: normal dentition, moist mucous membranes Cardiovascular: regular rhythm, normal rate, 2/6 SEM. S1 and S2 normal. Radial pulses normal bilaterally. No jugular venous distention.  Respiratory: clear to auscultation bilaterally GI : normal bowel sounds, soft and nontender. No distention.   MSK: extremities warm, well perfused. No edema.  NEURO: grossly nonfocal exam, moves all extremities. PSYCH: alert and oriented x 3, normal mood and affect.  ASSESSMENT:    1. Chest pain of uncertain etiology   2. Essential hypertension   3. Hx of transient ischemic attack (TIA)   4. Fatigue, unspecified type    PLAN:    The patient had a coronary angiogram in 2017 documenting minimal coronary artery disease.  It is unlikely that her chest pain is representative of ischemia, and she also endorses having spicy foods which can cause her acid reflux.  I would however like to update her echocardiogram and given that she has a 2 out of 6 systolic murmur on exam.  It has a flow quality to it but I would like to exclude progressing valvular disease.  If there are abnormalities on echocardiogram we will consider performing a Myoview stress test to exclude ischemia.  If she has recurrent chest pain, I would strongly  recommend Myoview stress.  TIME SPENT WITH PATIENT: 25 minutes of direct patient care. More than 50% of that time was spent on coordination of care and counseling regarding hospital visit, workup for chest pain, and medication management.Cherlynn Kaiser, MD [place]  [facility]   Medication Adjustments/Labs and Tests Ordered: Current medicines are reviewed at length with the patient today.  Concerns regarding medicines are outlined above.  Orders Placed This Encounter  Procedures  . EKG 12-Lead  . ECHOCARDIOGRAM COMPLETE   No orders of the defined types were placed in this encounter.   Patient Instructions  Medication Instructions:  Your physician recommends that you continue on your current medications as directed. Please refer to the Current Medication list given to you today.  If you need a refill on your cardiac medications before your next appointment, please call your pharmacy.   Lab work: NONE   Testing/Procedures: Your physician has requested that you have an echocardiogram. Echocardiography is a painless test that uses sound waves to create images of your heart. It provides your doctor with information about the size and shape of your heart and how well your heart's chambers and valves are working. This procedure takes approximately one hour. There are no restrictions for this procedure. [address]  Follow-Up: At [facility], you and your health needs are our priority.  As part of our continuing mission to provide you with exceptional heart care, we have created designated Provider Care Teams.  These Care Teams include your primary Cardiologist (physician) and Advanced Practice Providers (APPs -  Physician Assistants and Nurse Practitioners) who all work together to provide you with the care you need, when you need it. You will need a follow up appointment in 6 months.  Please call our office 2 months in advance to schedule this  appointment.  You may see Elouise Munroe, MD or one of the following Advanced Practice Providers on your designated Care Team:   Rosaria Ferries, PA-C . Jory Sims, DNP, ANP  Any Other Special Instructions  Will Be Listed Below (If Applicable).   Echocardiogram An echocardiogram is a procedure that uses painless sound waves (ultrasound) to produce an image of the heart. Images from an echocardiogram can provide important information about:  Signs of coronary artery disease (CAD).  Aneurysm detection. An aneurysm is a weak or damaged part of an artery wall that bulges out from the normal force of blood pumping through the body.  Heart size and shape. Changes in the size or shape of the heart can be associated with certain conditions, including heart failure, aneurysm, and CAD.  Heart muscle function.  Heart valve function.  Signs of a past heart attack.  Fluid buildup around the heart.  Thickening of the heart muscle.  A tumor or infectious growth around the heart valves. Tell a health care provider about:  Any allergies you have.  All medicines you are taking, including vitamins, herbs, eye drops, creams, and over-the-counter medicines.  Any blood disorders you have.  Any surgeries you have had.  Any medical conditions you have.  Whether you are pregnant or may be pregnant. What are the risks? Generally, this is a safe procedure. However, problems may occur, including:  Allergic reaction to dye (contrast) that may be used during the procedure. What happens before the procedure? No specific preparation is needed. You may eat and drink normally. What happens during the procedure?   An IV tube may be inserted into one of your veins.  You may receive contrast through this tube. A contrast is an injection that improves the quality of the pictures from your heart.  A gel will be applied to your chest.  A wand-like tool (transducer) will be moved over your  chest. The gel will help to transmit the sound waves from the transducer.  The sound waves will harmlessly bounce off of your heart to allow the heart images to be captured in real-time motion. The images will be recorded on a computer. The procedure may vary among health care providers and hospitals. What happens after the procedure?  You may return to your normal, everyday life, including diet, activities, and medicines, unless your health care provider tells you not to do that. Summary  An echocardiogram is a procedure that uses painless sound waves (ultrasound) to produce an image of the heart.  Images from an echocardiogram can provide important information about the size and shape of your heart, heart muscle function, heart valve function, and fluid buildup around your heart.  You do not need to do anything to prepare before this procedure. You may eat and drink normally.  After the echocardiogram is completed, you may return to your normal, everyday life, unless your health care provider tells you not to do that. This information is not intended to replace advice given to you by your health care provider. Make sure you discuss any questions you have with your health care provider. Document Released: 06/28/2000 Document Revised: 10/22/2018 Document Reviewed: 08/03/2016 Elsevier Patient Education  2020 [employer].

## 2019-01-25 ENCOUNTER — Telehealth: Payer: Self-pay | Admitting: Endocrinology

## 2019-01-25 ENCOUNTER — Other Ambulatory Visit: Payer: Self-pay

## 2019-01-25 MED ORDER — ONETOUCH VERIO VI STRP: ORAL_STRIP | 0 refills | Status: DC

## 2019-01-25 NOTE — Telephone Encounter (Signed)
glucose blood (ONETOUCH VERIO) test strip 100 each 0 01/25/2019    Sig: USE 1 STRIP TO CHECK GLUCOSE TWICE DAILY   Sent to pharmacy as: glucose blood (ONETOUCH VERIO) test strip   Notes to Pharmacy: Type 2 diabetes mellitus with peripheral neuropathy - E11.42   E-Prescribing Status: Receipt confirmed by pharmacy (01/25/2019 12:14 PM EDT)

## 2019-01-25 NOTE — Telephone Encounter (Signed)
MEDICATION: one touch test strips  PHARMACY:  [facility]  IS THIS A 90 DAY SUPPLY : y  IS PATIENT OUT OF MEDICATION: n  IF NOT; HOW MUCH IS LEFT: 2  LAST APPOINTMENT DATE: @6 /04/2019  NEXT APPOINTMENT DATE:@8 /18/2020  DO WE HAVE YOUR PERMISSION TO LEAVE A DETAILED MESSAGE:  OTHER COMMENTS: Pharmacy calling for rx to include dx code. I see the one in from 5/26 with the code, she did not see the code on that rx   **Let patient know to contact pharmacy at the end of the day to make sure medication is ready. **  ** Please notify patient to allow 48-72 hours to process**  **Encourage patient to contact the pharmacy for refills or they can request refills through [facility]**

## 2019-01-26 ENCOUNTER — Other Ambulatory Visit: Payer: Self-pay

## 2019-01-26 ENCOUNTER — Ambulatory Visit (HOSPITAL_COMMUNITY): Payer: Medicare Other | Attending: Cardiology

## 2019-01-26 DIAGNOSIS — R079 Chest pain, unspecified: Secondary | ICD-10-CM

## 2019-01-26 DIAGNOSIS — R0789 Other chest pain: Secondary | ICD-10-CM | POA: Diagnosis not present

## 2019-02-08 ENCOUNTER — Telehealth: Payer: Self-pay

## 2019-02-08 NOTE — Telephone Encounter (Signed)
Chart reviewed. Last seen 12/2018. Needs office visit for recheck.

## 2019-02-08 NOTE — Telephone Encounter (Signed)
Please advise 

## 2019-02-08 NOTE — Telephone Encounter (Signed)
Can you please call pt to schedule? °

## 2019-02-08 NOTE — Telephone Encounter (Signed)
Copied from [place] [id number]. Topic: General - Other >> Feb 08, 2019 10:32 AM Leward Quan A wrote: Reason for CRM: Patient called to say that her back pain have not gotten any better asking for an Rx to be sent to the pharmacy please can be reached at Ph# [phone]

## 2019-02-10 ENCOUNTER — Other Ambulatory Visit: Payer: Self-pay | Admitting: Endocrinology

## 2019-02-10 NOTE — Telephone Encounter (Signed)
Please forward refill request to pt's new primary care provider.  

## 2019-02-10 NOTE — Telephone Encounter (Signed)
Please advise if refill is appropriate 

## 2019-02-10 NOTE — Telephone Encounter (Signed)
Per Dr. Cordelia Pen request, I am forwarding you this pt refill request. Please review and refill if you deem appropriate.

## 2019-02-11 ENCOUNTER — Other Ambulatory Visit: Payer: Self-pay | Admitting: Endocrinology

## 2019-02-11 NOTE — Telephone Encounter (Signed)
Please advise if refill is appropriate 

## 2019-02-11 NOTE — Telephone Encounter (Signed)
Please forward refill request to pt's new primary care provider.  

## 2019-02-12 NOTE — Telephone Encounter (Signed)
At Dr. Ellison's request, I am forwarding you this patient's refill request. Please review and refill if appropriate. 

## 2019-02-16 ENCOUNTER — Encounter: Payer: Self-pay | Admitting: Family Medicine

## 2019-02-16 ENCOUNTER — Ambulatory Visit (INDEPENDENT_AMBULATORY_CARE_PROVIDER_SITE_OTHER): Payer: Medicare Other

## 2019-02-16 ENCOUNTER — Ambulatory Visit (INDEPENDENT_AMBULATORY_CARE_PROVIDER_SITE_OTHER): Payer: Medicare Other | Admitting: Family Medicine

## 2019-02-16 ENCOUNTER — Other Ambulatory Visit: Payer: Self-pay

## 2019-02-16 VITALS — BP 130/84 | HR 64 | Temp 97.8°F | Resp 14 | Ht 61.0 in | Wt 129.2 lb

## 2019-02-16 DIAGNOSIS — M47816 Spondylosis without myelopathy or radiculopathy, lumbar region: Secondary | ICD-10-CM | POA: Diagnosis not present

## 2019-02-16 DIAGNOSIS — M545 Low back pain, unspecified: Secondary | ICD-10-CM

## 2019-02-16 DIAGNOSIS — M47817 Spondylosis without myelopathy or radiculopathy, lumbosacral region: Secondary | ICD-10-CM | POA: Diagnosis not present

## 2019-02-16 NOTE — Progress Notes (Signed)
Subjective  CC:  Chief Complaint  Patient presents with  . Back Pain    Pain is located in the lower right side, she is taking Tylenol with relief. Pain increases with certain movement.. She states that she gets upset easily and worries, wonders if that may increase the pain    HPI: Tammy Hughes is a 85 y.o. female who presents to the office today to address the problems listed above in the chief complaint.  Very pleasant 85 year old with history of lumbar DJD and occasional low back pain, last seen in June for the same, and treated conservatively with Tylenol.  She said she did well until about a month ago when she got a new mattress.  Unfortunately it is too hard for her and she has nightly pain and cannot get comfortable at night.  She is now suffering again from mild right low back pain relieved with Tylenol.  She denies radicular symptoms, loss of bowel or bladder function.  Pain is not severe.  Last x-rays were done several years back.  She denies rashes or systemic symptoms.  Of note, she slipped and her daughters last night and that mattress is very soft.  She awoke today without any pain. Assessment  1. Acute midline low back pain without sciatica   2. Lumbar facet arthropathy      Plan   Low back pain, right: Continue Tylenol, needs to get a new mattress.  Check x-rays.  Discussed red flags, none identified today.  Follow-up if not improving.  Follow up: Return in about 4 weeks (around 03/16/2019) for follow up Hypertension with Dr. Rogers Blocker .  Visit date not found  Orders Placed This Encounter  Procedures  . DG Lumbar Spine 2-3 Views   No orders of the defined types were placed in this encounter.     I reviewed the patients updated PMH, FH, and SocHx.    Patient Active Problem List   Diagnosis Date Noted  . Genital herpes 08/19/2018  . CAD (coronary artery disease) 08/13/2018  . Depression 08/13/2018  . TIA (transient ischemic attack) 08/13/2018  . Abnormal nuclear  stress test   . Normocytic anemia 02/01/2016  . Memory loss 08/02/2015  . DVT (deep venous thrombosis) ([place]) 10/27/2014  . Rash 10/25/2014  . Abdominal pain 08/30/2014  . Type 2 diabetes mellitus with peripheral neuropathy ([place]) 08/04/2014  . NSVT post-op 07/24/2014  . Gastric cancer-s/p gastrectomy 07/20/14 06/15/2014  . Postinflammatory pulmonary fibrosis ([place]) 08/06/2013  . NASH (nonalcoholic steatohepatitis) 10/21/2011  . Carpal tunnel syndrome of left wrist 11/23/2010  . BACK PAIN, LUMBAR 12/19/2009  . LEUKOPENIA, MILD 02/15/2009  . Anxiety state 03/08/2008  . Essential hypertension 03/08/2008  . Non-allergic rhinitis 03/08/2008  . DIVERTICULOSIS, COLON 03/08/2008  . HYPERCHOLESTEROLEMIA 02/01/2008  . GERD 02/23/2007  . Osteoarthritis 02/23/2007  . Osteoporosis 02/23/2007   Current Meds  Medication Sig  . Acetaminophen (TYLENOL PO) Take by mouth.  . calcium carbonate (TUMS - DOSED IN MG ELEMENTAL CALCIUM) 500 MG chewable tablet Chew 1 tablet by mouth daily.  . clopidogrel (PLAVIX) 75 MG tablet Take 1 tablet (75 mg total) by mouth daily.  . diclofenac sodium (VOLTAREN) 1 % GEL Apply 4 g topically 4 (four) times daily. For knee pain  . esomeprazole (NEXIUM) 40 MG capsule TAKE 1 CAPSULE DAILY AT    NOON  . glucose blood (ONETOUCH VERIO) test strip USE 1 STRIP TO CHECK GLUCOSE TWICE DAILY  . insulin aspart protamine - aspart (NOVOLOG MIX  70/30 FLEXPEN) (70-30) 100 UNIT/ML FlexPen 22 units with breakfast, and 6 units with supper, and pen needles 2/day  . mirtazapine (REMERON) 15 MG tablet TAKE 1 TABLET BY MOUTH EACH NIGHT AT BEDTIME AS DIRECTED    Allergies: Patient is allergic to pirfenidone and aspirin. Family History: Patient family history includes Cancer in her father, sister, and sister; Diabetes in her sister and sister; Other in her mother. Social History:  Patient  reports that she has never smoked. She has never used smokeless tobacco. She reports that she does not  drink alcohol or use drugs.  Review of Systems: Constitutional: Negative for fever malaise or anorexia Cardiovascular: negative for chest pain Respiratory: negative for SOB or persistent cough Gastrointestinal: negative for abdominal pain  Objective  Vitals: BP 130/84   Pulse 64   Temp 97.8 F (36.6 C) (Tympanic)   Resp 14   Ht 5\' 1"  (1.549 m)   Wt 129 lb 3.2 oz (58.6 kg)   SpO2 97%   BMI 24.41 kg/m  General: no acute distress , A&Ox3 Back: No spinal tenderness, mild right lower paravertebral tenderness without significant spasm.  No trochanteric bursa tenderness, SI joint or sciatic notch tenderness.  Negative seated straight leg bilaterally.  Normal gait.  Fairly good range of motion.   Commons side effects, risks, benefits, and alternatives for medications and treatment plan prescribed today were discussed, and the patient expressed understanding of the given instructions. Patient is instructed to call or message via MyChart if he/she has any questions or concerns regarding our treatment plan. No barriers to understanding were identified. We discussed Red Flag symptoms and signs in detail. Patient expressed understanding regarding what to do in case of urgent or emergency type symptoms.   Medication list was reconciled, printed and provided to the patient in AVS. Patient instructions and summary information was reviewed with the patient as documented in the AVS. This note was prepared with assistance of Dragon voice recognition software. Occasional wrong-word or sound-a-like substitutions may have occurred due to the inherent limitations of voice recognition software

## 2019-02-16 NOTE — Patient Instructions (Addendum)
Please return in 4 weeks with Dr. Rogers Blocker to recheck HTN and chronic medical problems.   Take tylenol twice a day and try to get a different mattress.   If you have any questions or concerns, please don't hesitate to send me a message via MyChart or call the office at [phone]. Thank you for visiting with [place] today! It's our pleasure caring for you.

## 2019-02-17 NOTE — Progress Notes (Signed)
Please call patient: I have reviewed his/her lab results. Please check on her; her xray shows arthritis as expected but no new worrisome findings. Getting a new mattress should help. Thanks.

## 2019-02-23 ENCOUNTER — Inpatient Hospital Stay: Payer: Medicare Other | Attending: Oncology | Admitting: Oncology

## 2019-02-23 ENCOUNTER — Other Ambulatory Visit: Payer: Self-pay

## 2019-02-23 VITALS — BP 164/85 | HR 66 | Temp 98.3°F | Resp 17 | Ht 61.0 in | Wt 127.6 lb

## 2019-02-23 DIAGNOSIS — I252 Old myocardial infarction: Secondary | ICD-10-CM | POA: Insufficient documentation

## 2019-02-23 DIAGNOSIS — C169 Malignant neoplasm of stomach, unspecified: Secondary | ICD-10-CM | POA: Diagnosis not present

## 2019-02-23 DIAGNOSIS — F329 Major depressive disorder, single episode, unspecified: Secondary | ICD-10-CM | POA: Insufficient documentation

## 2019-02-23 DIAGNOSIS — J8410 Pulmonary fibrosis, unspecified: Secondary | ICD-10-CM | POA: Diagnosis not present

## 2019-02-23 DIAGNOSIS — Z79818 Long term (current) use of other agents affecting estrogen receptors and estrogen levels: Secondary | ICD-10-CM | POA: Diagnosis not present

## 2019-02-23 DIAGNOSIS — Z85028 Personal history of other malignant neoplasm of stomach: Secondary | ICD-10-CM | POA: Diagnosis not present

## 2019-02-23 DIAGNOSIS — E119 Type 2 diabetes mellitus without complications: Secondary | ICD-10-CM | POA: Insufficient documentation

## 2019-02-23 NOTE — Progress Notes (Signed)
  [place] OFFICE PROGRESS NOTE   Diagnosis: Gastric cancer  INTERVAL HISTORY:   Ms. Matty returns as scheduled.  He feels well.  No new complaint.  No dyspnea.  She is caring for herself.  She says her appetite is not as good in the hot weather.  No dysphasia.  Objective:  Vital signs in last 24 hours:  Blood pressure (!) 164/85, pulse 66, temperature 98.3 F (36.8 C), temperature source Oral, resp. rate 17, height 5\' 1"  (1.549 m), weight 127 lb 9.6 oz (57.9 kg), SpO2 97 %.   Limited physical examination secondary to distancing with the COVID pandemic HEENT: Neck without mass Lymphatics: No cervical, supraclavicular, axillary, or inguinal nodes GI: No hepatosplenomegaly, no mass, nontender Vascular: No leg edema, the right lower leg is slightly larger than the left side  Lab Results:  Lab Results  Component Value Date   WBC 6.6 08/13/2018   HGB 12.0 08/13/2018   HCT 37.1 08/13/2018   MCV 89.2 08/13/2018   PLT 321.0 08/13/2018   NEUTROABS 4.2 08/13/2018    Medications: I have reviewed the patient's current medications.   Assessment/Plan:  1. Gastric cancer, status post an endoscopic biopsy of a lesser curvature mass on 05/31/2014 confirming adenocarcinoma ? Staging CTs of the chest and abdomen on 06/08/2014 revealed no evidence of metastatic disease ? Proximal gastrectomy and feeding jejunostomy 07/20/2014, pT2,pN1 ? Adjuvant weekly 5-Fu/leucovorin 09/14/2014, 09/21/2014, 09/28/2014 and 10/05/2014 ? Initiation of radiation and infusional 5-FU 10/17/2014 ? 5-fluorouracil pump discontinued on 10/27/2014 due to toxicity. ? Radiation discontinued 11/02/2014  2. Pulmonary fibrosis 3. Esophageal stricture, status post a dilatation procedure 05/31/2014 4. Diabetes 5. Postoperative ventricle tachycardia, NSTEMI January 2016 6. Hospital-acquired pneumonia January 2016 7. CT abdomen/pelvis 09/02/2014 with postsurgical changes. Focal fluid collection in the  midline abutting the distal greater curvature of the stomach measuring 2.2 x 2.9 x 3.4 cm. Mild stranding in the adjacent fat. 8. PICC placement 10/17/2014 9. Rash. Most likely related to the 5-fluorouracil. Resolved. 10. Right upper extremity DVT 10/25/2014. She completed a course of Xarelto. 11. Hospitalization 10/27/2014 through 10/28/2014 nausea/vomiting and left arm discomfort. Noted to have skin toxicity from the 5-fluorouracil. The 5-fluorouracil was discontinued. 12. Depression. She continues Remeron.   Disposition: Ms. Smithey is in clinical remission from gastric cancer.  She is now almost 5 years out from diagnosis.  She would like to continue follow-up at the Cancer center.  She will return for an office visit in 6 months. 15 minutes were spent with the patient today.  The majority of the time was used for counseling and coordination of care.  [name], MD  02/23/2019  3:11 PM

## 2019-02-24 ENCOUNTER — Telehealth: Payer: Self-pay | Admitting: Oncology

## 2019-02-24 NOTE — Telephone Encounter (Signed)
Called and left msg. Mailed printout  °

## 2019-02-26 ENCOUNTER — Other Ambulatory Visit: Payer: Self-pay

## 2019-03-02 ENCOUNTER — Other Ambulatory Visit: Payer: Self-pay

## 2019-03-02 ENCOUNTER — Ambulatory Visit (INDEPENDENT_AMBULATORY_CARE_PROVIDER_SITE_OTHER): Payer: Medicare Other | Admitting: Endocrinology

## 2019-03-02 ENCOUNTER — Encounter: Payer: Self-pay | Admitting: Endocrinology

## 2019-03-02 VITALS — BP 154/62 | HR 69 | Ht 61.0 in | Wt 127.8 lb

## 2019-03-02 DIAGNOSIS — Z794 Long term (current) use of insulin: Secondary | ICD-10-CM | POA: Diagnosis not present

## 2019-03-02 DIAGNOSIS — R54 Age-related physical debility: Secondary | ICD-10-CM | POA: Diagnosis not present

## 2019-03-02 DIAGNOSIS — E119 Type 2 diabetes mellitus without complications: Secondary | ICD-10-CM

## 2019-03-02 DIAGNOSIS — E1159 Type 2 diabetes mellitus with other circulatory complications: Secondary | ICD-10-CM

## 2019-03-02 DIAGNOSIS — E1142 Type 2 diabetes mellitus with diabetic polyneuropathy: Secondary | ICD-10-CM

## 2019-03-02 DIAGNOSIS — R609 Edema, unspecified: Secondary | ICD-10-CM

## 2019-03-02 LAB — POCT GLYCOSYLATED HEMOGLOBIN (HGB A1C): Hemoglobin A1C: 7.3 % — AB (ref 4.0–5.6)

## 2019-03-02 NOTE — Progress Notes (Signed)
Subjective:    Patient ID: Tammy Hughes, female    DOB: 1933-06-04, 85 y.o.   MRN: [id number]  HPI Pt returns for f/u of diabetes mellitus:  DM type: Insulin-requiring type 2.  Dx'ed: 2841.   Complications: polyneuropathy and CAD.  Therapy: insulin since 2005.  GDM: never.   DKA: never.   Severe hypoglycemia: never.   Pancreatitis: never.  Other: she chose bid premixed insulin.   Interval history: no cbg record, but states  it varies from 140-340.  It is in general highest after breakfast.  pt states she feels well in general.   Past Medical History:  Diagnosis Date  . Allergy    SEASONAL  . ANXIETY 03/08/2008  . Cataract    REMOVED BILATERAL  . Depression   . DIVERTICULOSIS, COLON 03/08/2008  . DVT (deep venous thrombosis) ([place])   . GERD 02/23/2007  . HYPERCHOLESTEROLEMIA 02/01/2008  . HYPERTENSION 03/08/2008  . Insulin dependent diabetes mellitus ([place])   . OSTEOARTHRITIS 02/23/2007  . OSTEOPOROSIS 02/23/2007  . PERIPHERAL NEUROPATHY 02/23/2007  . STD (sexually transmitted disease)   . Stomach cancer ([place]) 07/20/14   invasive adenocarcinoma w/signet rings features  . Urinary incontinence   . Urolithiasis     Past Surgical History:  Procedure Laterality Date  . ABDOMINAL SURGERY    . CARDIAC CATHETERIZATION N/A 02/02/2016   Procedure: Left Heart Cath and Coronary Angiography;  Surgeon: Peter M Martinique, MD;  Location: [facility];  Service: Cardiovascular;  Laterality: N/A;  . CHOLECYSTECTOMY  1995  . ESOPHAGOGASTRODUODENOSCOPY  06/08/2002  . GASTRECTOMY N/A 07/20/2014   Procedure: PROXIMAL GASTRECTOMY;  Surgeon: Stark Klein, MD;  Location: [facility];  Service: General;  Laterality: N/A;  . GASTROJEJUNOSTOMY N/A 07/20/2014   Procedure: GASTROJEJUNOSTOMY;  Surgeon: Stark Klein, MD;  Location: [facility];  Service: General;  Laterality: N/A;  . LAPAROSCOPY N/A 07/20/2014   Procedure: LAPAROSCOPY DIAGNOSTIC;  Surgeon: Stark Klein, MD;  Location: [facility];  Service: General;   Laterality: N/A;    Social History   Socioeconomic History  . Marital status: Married    Spouse name: Not on file  . Number of children: Not on file  . Years of education: Not on file  . Highest education level: Not on file  Occupational History  . Occupation: Retired    Fish farm manager: RETIRED  Social Needs  . Financial resource strain: Not on file  . Food insecurity    Worry: Not on file    Inability: Not on file  . Transportation needs    Medical: Not on file    Non-medical: Not on file  Tobacco Use  . Smoking status: Never Smoker  . Smokeless tobacco: Never Used  Substance and Sexual Activity  . Alcohol use: No    Alcohol/week: 0.0 standard drinks  . Drug use: No  . Sexual activity: Not Currently    Birth control/protection: Post-menopausal  Lifestyle  . Physical activity    Days per week: Not on file    Minutes per session: Not on file  . Stress: Not on file  Relationships  . Social Herbalist on phone: Not on file    Gets together: Not on file    Attends religious service: Not on file    Active member of club or organization: Not on file    Attends meetings of clubs or organizations: Not on file    Relationship status: Not on file  . Intimate partner violence  Fear of current or ex partner: Not on file    Emotionally abused: Not on file    Physically abused: Not on file    Forced sexual activity: Not on file  Other Topics Concern  . Not on file  Social History Narrative   Dentist       Current Outpatient Medications on File Prior to Visit  Medication Sig Dispense Refill  . Acetaminophen (TYLENOL PO) Take 500 mg by mouth 2 (two) times daily.     . calcium carbonate (TUMS - DOSED IN MG ELEMENTAL CALCIUM) 500 MG chewable tablet Chew 1 tablet by mouth daily.    . clopidogrel (PLAVIX) 75 MG tablet Take 1 tablet (75 mg total) by mouth daily. 90 tablet 1  . diclofenac sodium (VOLTAREN) 1 % GEL Apply 4 g topically 4 (four) times  daily. For knee pain 100 g 11  . esomeprazole (NEXIUM) 40 MG capsule TAKE 1 CAPSULE DAILY AT    NOON 30 capsule 8  . glucose blood (ONETOUCH VERIO) test strip USE 1 STRIP TO CHECK GLUCOSE TWICE DAILY 100 each 0  . insulin aspart protamine - aspart (NOVOLOG MIX 70/30 FLEXPEN) (70-30) 100 UNIT/ML FlexPen 22 units with breakfast, and 6 units with supper, and pen needles 2/day 10 pen 2  . mirtazapine (REMERON) 15 MG tablet TAKE 1 TABLET BY MOUTH EACH NIGHT AT BEDTIME AS DIRECTED 90 tablet 3   No current facility-administered medications on file prior to visit.     Allergies  Allergen Reactions  . Pirfenidone Diarrhea and Nausea And Vomiting  . Aspirin     rash    Family History  Problem Relation Age of Onset  . Cancer Father        Prostate Cancer  . Cancer Sister        Breast Cancer  . Diabetes Sister   . Diabetes Sister   . Other Mother        pneumonia   . Cancer Sister        breast cancer     BP (!) 154/62 (BP Location: Right Arm, Patient Position: Sitting, Cuff Size: Normal)   Pulse 69   Ht 5\' 1"  (1.549 m)   Wt 127 lb 12.8 oz (58 kg)   SpO2 97%   BMI 24.15 kg/m    Review of Systems She denies hypoglycemia.      Objective:   Physical Exam VITAL SIGNS:  See vs page GENERAL: no distress Pulses: dorsalis pedis intact bilat.   MSK: no deformity of the feet CV: trace bilat leg edema Skin:  no ulcer on the feet.  normal color and temp on the feet. Neuro: sensation is intact to touch on the feet  Lab Results  Component Value Date   HGBA1C 7.3 (A) 03/02/2019       Assessment & Plan:  HTN: is noted today Insulin-requiring type 2 DM, with CAD: this is the best control this pt should aim for, given this regimen, which does match insulin to her changing needs throughout the day. Edema: This limits rx options Frail elderly state: in this setting, she is not a candidate for aggressive glycemic control.   Patient Instructions  Your blood pressure is high today.   Please see your primary care provider soon, to have it rechecked. Please continue the same insulin check your blood sugar twice a day.  vary the time of day when you check, between before the 3 meals, and at bedtime.  also check if  you have symptoms of your blood sugar being too high or too low.  please keep a record of the readings and bring it to your next appointment here (or you can bring the meter itself).  You can write it on any piece of paper.  please call us sooner if your blood sugar goes below 70, or if you have a lot of readings over 200.   Please come back for a follow-up appointment in 3 months.

## 2019-03-02 NOTE — Patient Instructions (Addendum)
Your blood pressure is high today.  Please see your primary care provider soon, to have it rechecked Please continue the same insulin check your blood sugar twice a day.  vary the time of day when you check, between before the 3 meals, and at bedtime.  also check if you have symptoms of your blood sugar being too high or too low.  please keep a record of the readings and bring it to your next appointment here (or you can bring the meter itself).  You can write it on any piece of paper.  please call us sooner if your blood sugar goes below 70, or if you have a lot of readings over 200. Please come back for a follow-up appointment in 3 months.  

## 2019-03-11 ENCOUNTER — Telehealth: Payer: Self-pay | Admitting: Endocrinology

## 2019-03-11 ENCOUNTER — Other Ambulatory Visit: Payer: Self-pay

## 2019-03-11 DIAGNOSIS — E119 Type 2 diabetes mellitus without complications: Secondary | ICD-10-CM

## 2019-03-11 DIAGNOSIS — Z794 Long term (current) use of insulin: Secondary | ICD-10-CM

## 2019-03-11 MED ORDER — ONETOUCH VERIO VI STRP: ORAL_STRIP | 0 refills | Status: DC

## 2019-03-11 NOTE — Telephone Encounter (Signed)
glucose blood (ONETOUCH VERIO) test strip 100 each 0 03/11/2019    Sig: USE 1 STRIP TO CHECK GLUCOSE TWICE DAILY; E11.42   Sent to pharmacy as: glucose blood (ONETOUCH VERIO) test strip   Notes to Pharmacy: Type 2 diabetes mellitus with peripheral neuropathy - E11.42   E-Prescribing Status: Receipt confirmed by pharmacy (03/11/2019 3:12 PM EDT)

## 2019-03-11 NOTE — Telephone Encounter (Signed)
[facility] called stating that the Rx for One touch verio strip needs Dx on it for ins.  Thank you!

## 2019-03-18 ENCOUNTER — Ambulatory Visit: Payer: Medicare Other | Admitting: Family Medicine

## 2019-03-19 ENCOUNTER — Ambulatory Visit (INDEPENDENT_AMBULATORY_CARE_PROVIDER_SITE_OTHER): Payer: Medicare Other | Admitting: Family Medicine

## 2019-03-19 ENCOUNTER — Other Ambulatory Visit: Payer: Self-pay

## 2019-03-19 ENCOUNTER — Encounter: Payer: Self-pay | Admitting: Family Medicine

## 2019-03-19 VITALS — BP 130/72 | HR 53 | Temp 97.6°F | Ht 61.0 in | Wt 128.6 lb

## 2019-03-19 DIAGNOSIS — I10 Essential (primary) hypertension: Secondary | ICD-10-CM

## 2019-03-19 DIAGNOSIS — Z23 Encounter for immunization: Secondary | ICD-10-CM | POA: Diagnosis not present

## 2019-03-19 DIAGNOSIS — M810 Age-related osteoporosis without current pathological fracture: Secondary | ICD-10-CM | POA: Diagnosis not present

## 2019-03-19 LAB — COMPREHENSIVE METABOLIC PANEL
ALT: 14 U/L (ref 0–35)
AST: 19 U/L (ref 0–37)
Albumin: 3.8 g/dL (ref 3.5–5.2)
Alkaline Phosphatase: 82 U/L (ref 39–117)
BUN: 25 mg/dL — ABNORMAL HIGH (ref 6–23)
CO2: 29 mEq/L (ref 19–32)
Calcium: 10.6 mg/dL — ABNORMAL HIGH (ref 8.4–10.5)
Chloride: 105 mEq/L (ref 96–112)
Creatinine, Ser: 0.97 mg/dL (ref 0.40–1.20)
GFR: 66.21 mL/min (ref >60.00)
Glucose, Bld: 94 mg/dL (ref 70–99)
Potassium: 4.1 mEq/L (ref 3.5–5.1)
Sodium: 142 mEq/L (ref 135–145)
Total Bilirubin: 0.2 mg/dL (ref 0.2–1.2)
Total Protein: 7.9 g/dL (ref 6.0–8.3)

## 2019-03-19 LAB — CBC WITH DIFFERENTIAL/PLATELET
Basophils Absolute: 0 10*3/uL (ref 0.0–0.1)
Basophils Relative: 0.4 % (ref 0.0–3.0)
Eosinophils Absolute: 0.1 10*3/uL (ref 0.0–0.7)
Eosinophils Relative: 1.8 % (ref 0.0–5.0)
HCT: 36.3 % (ref 36.0–46.0)
Hemoglobin: 11.8 g/dL — ABNORMAL LOW (ref 12.0–15.0)
Lymphocytes Relative: 30.9 % (ref 12.0–46.0)
Lymphs Abs: 1.9 10*3/uL (ref 0.7–4.0)
MCHC: 32.4 g/dL (ref 30.0–36.0)
MCV: 92.3 fl (ref 78.0–100.0)
Monocytes Absolute: 0.5 10*3/uL (ref 0.1–1.0)
Monocytes Relative: 8.6 % (ref 3.0–12.0)
Neutro Abs: 3.6 10*3/uL (ref 1.4–7.7)
Neutrophils Relative %: 58.3 % (ref 43.0–77.0)
Platelets: 314 10*3/uL (ref 150.0–400.0)
RBC: 3.93 Mil/uL (ref 3.87–5.11)
RDW: 13.8 % (ref 11.5–15.5)
WBC: 6.2 10*3/uL (ref 4.0–10.5)

## 2019-03-19 LAB — LIPID PANEL
Cholesterol: 221 mg/dL — ABNORMAL HIGH (ref 0–200)
HDL: 41.6 mg/dL (ref >39.00)
NonHDL: 179.51
Total CHOL/HDL Ratio: 5
Triglycerides: 215 mg/dL — ABNORMAL HIGH (ref 0.0–149.0)
VLDL: 43 mg/dL — ABNORMAL HIGH (ref 0.0–40.0)

## 2019-03-19 LAB — MICROALBUMIN / CREATININE URINE RATIO
Creatinine,U: 74.3 mg/dL
Microalb Creat Ratio: 5 mg/g (ref 0.0–30.0)
Microalb, Ur: 3.7 mg/dL — ABNORMAL HIGH (ref 0.0–1.9)

## 2019-03-19 LAB — LDL CHOLESTEROL, DIRECT: Direct LDL: 159 mg/dL

## 2019-03-19 LAB — VITAMIN D 25 HYDROXY (VIT D DEFICIENCY, FRACTURES): VITD: 19.53 ng/mL — ABNORMAL LOW (ref 30.00–100.00)

## 2019-03-19 NOTE — Progress Notes (Signed)
Patient: Tammy Hughes MRN: [id number] DOB: [birth year] PCP: Orma Flaming, MD     Subjective:  Chief Complaint  Patient presents with  . Hypertension    HPI: The patient is a 85 y.o. female who presents today for blood pressure follow up.   Hypertension: Here for follow up of hypertension.  Currently on no medication.  Beta blocker was stopped due to fatigue.  Exercise includes walking. Weight has been stable. Denies any chest pain, headaches, shortness of breath, vision changes, swelling in lower extremities.    Review of Systems  Constitutional: Negative for chills, fatigue and fever.  Eyes: Negative for visual disturbance.  Respiratory: Negative for cough, chest tightness and shortness of breath.   Cardiovascular: Negative for chest pain, palpitations and leg swelling.  Gastrointestinal: Negative for abdominal pain, diarrhea, nausea and vomiting.  Musculoskeletal: Positive for back pain. Negative for myalgias and neck pain.         Pain is better since seeing Dr. Jonni Sanger   Neurological: Positive for headaches. Negative for dizziness.  Psychiatric/Behavioral: Negative for sleep disturbance.    Allergies Patient is allergic to pirfenidone and aspirin.  Past Medical History Patient  has a past medical history of Allergy, ANXIETY (03/08/2008), Cataract, Depression, DIVERTICULOSIS, COLON (03/08/2008), DVT (deep venous thrombosis) ([place]), GERD (02/23/2007), HYPERCHOLESTEROLEMIA (02/01/2008), HYPERTENSION (03/08/2008), Insulin dependent diabetes mellitus ([place]), OSTEOARTHRITIS (02/23/2007), OSTEOPOROSIS (02/23/2007), PERIPHERAL NEUROPATHY (02/23/2007), STD (sexually transmitted disease), Stomach cancer ([place]) (07/20/14), Urinary incontinence, and Urolithiasis.  Surgical History Patient  has a past surgical history that includes Cholecystectomy (1995); Esophagogastroduodenoscopy (06/08/2002); Gastrectomy (N/A, 07/20/2014); laparoscopy (N/A, 07/20/2014); Gastrojejunostomy (N/A, 07/20/2014); Cardiac  catheterization (N/A, 02/02/2016); and Abdominal surgery.  Family History Pateint's family history includes Cancer in her father, sister, and sister; Diabetes in her sister and sister; Other in her mother.  Social History Patient  reports that she has never smoked. She has never used smokeless tobacco. She reports that she does not drink alcohol or use drugs.    Objective: Vitals:   03/19/19 1030  BP: 130/72  Pulse: (!) 53  Temp: 97.6 F (36.4 C)  TempSrc: Skin  SpO2: 97%  Weight: 128 lb 9.6 oz (58.3 kg)  Height: 5\' 1"  (1.549 m)    Body mass index is 24.3 kg/m.  Physical Exam Vitals signs reviewed.  Constitutional:      Appearance: Normal appearance. She is normal weight.  HENT:     Head: Normocephalic and atraumatic.     Right Ear: Tympanic membrane, ear canal and external ear normal.     Left Ear: Tympanic membrane, ear canal and external ear normal.     Nose: Nose normal.  Neck:     Musculoskeletal: Normal range of motion and neck supple.  Cardiovascular:     Rate and Rhythm: Normal rate and regular rhythm.     Heart sounds: Normal heart sounds.  Pulmonary:     Effort: Pulmonary effort is normal.     Comments: velcro like sounds in bilateral bases.  Abdominal:     General: Abdomen is flat. Bowel sounds are normal.     Palpations: Abdomen is soft.  Neurological:     General: No focal deficit present.     Mental Status: She is alert and oriented to person, place, and time.  Psychiatric:        Mood and Affect: Mood normal.        Behavior: Behavior normal.        Assessment/plan: 1. Essential hypertension Blood pressure to goal without medication  and even stopping beta blocker. Fatigue resolved. Continue with exercise/walking. F/u in 6 months for routine follow up. Baseline labs today.  - CBC with Differential/Platelet - Comprehensive metabolic panel - Lipid panel - Microalbumin / creatinine urine ratio  2. Osteoporosis without current pathological  fracture, unspecified osteoporosis type Declines DEXA as she does not want any medication. Checking vitamin d.  - VITAMIN D 25 Hydroxy (Vit-D Deficiency, Fractures)  3. Need for immunization against influenza  - Flu Vaccine QUAD High Dose(Fluad)   Return in about 6 months (around 09/16/2019) for htn/routine check up .   Orma Flaming, MD [facility]   03/19/2019

## 2019-03-23 ENCOUNTER — Encounter: Payer: Self-pay | Admitting: Family Medicine

## 2019-03-23 ENCOUNTER — Other Ambulatory Visit: Payer: Self-pay | Admitting: Family Medicine

## 2019-03-23 DIAGNOSIS — E559 Vitamin D deficiency, unspecified: Secondary | ICD-10-CM | POA: Insufficient documentation

## 2019-03-23 MED ORDER — VITAMIN D (ERGOCALCIFEROL) 1.25 MG (50000 UNIT) PO CAPS: ORAL_CAPSULE | ORAL | 0 refills | Status: DC

## 2019-03-26 ENCOUNTER — Telehealth: Payer: Self-pay | Admitting: Endocrinology

## 2019-03-26 NOTE — Telephone Encounter (Signed)
Error

## 2019-03-31 ENCOUNTER — Telehealth: Payer: Self-pay | Admitting: Family Medicine

## 2019-03-31 NOTE — Telephone Encounter (Signed)
Spoke to patient's daughter, Rip Harbour and reviewed all lab results.  She verbalized understanding.

## 2019-03-31 NOTE — Telephone Encounter (Signed)
See note  Copied from [place] [id number]. Topic: General - Other >> Mar 31, 2019  4:10 PM Pauline Good wrote: Reason for CRM: pt's daughter want call back to discuss her mom lab results

## 2019-04-20 ENCOUNTER — Ambulatory Visit: Payer: Self-pay | Admitting: *Deleted

## 2019-04-20 NOTE — Telephone Encounter (Signed)
  Reason for Disposition . [1] MODERATE dizziness (e.g., interferes with normal activities) AND [2] has NOT been evaluated by physician for this  (Exception: dizziness caused by heat exposure, sudden standing, or poor fluid intake)  Answer Assessment - Initial Assessment Questions 1. DESCRIPTION: "Describe your dizziness."     Lightheaded  2. LIGHTHEADED: "Do you feel lightheaded?" (e.g., somewhat faint, woozy, weak upon standing)    Feels light headed, weak with standing up 3. VERTIGO: "Do you feel like either you or the room is spinning or tilting?" (i.e. vertigo)     Room not spinning or tilting  4. SEVERITY: "How bad is it?"  "Do you feel like you are going to faint?" "Can you stand and walk?"   - MILD - walking normally   - MODERATE - interferes with normal activities (e.g., work, school)    - SEVERE - unable to stand, requires support to walk, feels like passing out now.      Moderate  5. ONSET:  "When did the dizziness begin?"     Yesterday morning  6. AGGRAVATING FACTORS: "Does anything make it worse?" (e.g., standing, change in head position)     Standing up  7. HEART RATE: "Can you tell me your heart rate?" "How many beats in 15 seconds?"  (Note: not all patients can do this)       Unsure  8. CAUSE: "What do you think is causing the dizziness?"     Unsure  9. RECURRENT SYMPTOM: "Have you had dizziness before?" If so, ask: "When was the last time?" "What happened that time?"     Does not remember having this before  10. OTHER SYMPTOMS: "Do you have any other symptoms?" (e.g., fever, chest pain, vomiting, diarrhea, bleeding)       No other symptoms 11. PREGNANCY: "Is there any chance you are pregnant?" "When was your last menstrual period?"       N/A  Protocols used: DIZZINESS [facility]   Patient states she started feeling dizzy yesterday morning and the sensation has continued since then when she sits up from lying down or standing from sitting.  She rates the  dizziness as moderate, states she feels a little weak when she first stands up.  Her blood sugar this morning was 401 before taking her insulin.  I had her check her blood sugar while we were on the phone, and it is 236 now, and temperature 98.3.  She denies any other unusual symptoms.  Advised patient she should be seen by her PCP within 24 hours.  She is scheduled with PCP for tomorrow at 10:40 am.  Advised patient to proceed to ED for evaluation if she experienced any chest pain, shortness of great, or numbness before her appointment. Patient is agreeable and expressed understanding.

## 2019-04-21 ENCOUNTER — Encounter: Payer: Self-pay | Admitting: Family Medicine

## 2019-04-21 ENCOUNTER — Other Ambulatory Visit: Payer: Self-pay

## 2019-04-21 ENCOUNTER — Ambulatory Visit (INDEPENDENT_AMBULATORY_CARE_PROVIDER_SITE_OTHER): Payer: Medicare Other | Admitting: Family Medicine

## 2019-04-21 ENCOUNTER — Other Ambulatory Visit: Payer: Self-pay | Admitting: Family Medicine

## 2019-04-21 VITALS — BP 147/80 | HR 59 | Temp 97.8°F | Ht 61.0 in | Wt 129.2 lb

## 2019-04-21 DIAGNOSIS — R002 Palpitations: Secondary | ICD-10-CM | POA: Diagnosis not present

## 2019-04-21 DIAGNOSIS — R42 Dizziness and giddiness: Secondary | ICD-10-CM | POA: Diagnosis not present

## 2019-04-21 LAB — COMPREHENSIVE METABOLIC PANEL
ALT: 14 U/L (ref 0–35)
AST: 19 U/L (ref 0–37)
Albumin: 3.9 g/dL (ref 3.5–5.2)
Alkaline Phosphatase: 73 U/L (ref 39–117)
BUN: 18 mg/dL (ref 6–23)
CO2: 30 mEq/L (ref 19–32)
Calcium: 11.2 mg/dL — ABNORMAL HIGH (ref 8.4–10.5)
Chloride: 105 mEq/L (ref 96–112)
Creatinine, Ser: 0.93 mg/dL (ref 0.40–1.20)
GFR: 69.49 mL/min (ref >60.00)
Glucose, Bld: 111 mg/dL — ABNORMAL HIGH (ref 70–99)
Potassium: 4 mEq/L (ref 3.5–5.1)
Sodium: 142 mEq/L (ref 135–145)
Total Bilirubin: 0.2 mg/dL (ref 0.2–1.2)
Total Protein: 7.6 g/dL (ref 6.0–8.3)

## 2019-04-21 LAB — CBC WITH DIFFERENTIAL/PLATELET
Basophils Absolute: 0 10*3/uL (ref 0.0–0.1)
Basophils Relative: 0.5 % (ref 0.0–3.0)
Eosinophils Absolute: 0.1 10*3/uL (ref 0.0–0.7)
Eosinophils Relative: 1.3 % (ref 0.0–5.0)
HCT: 36.6 % (ref 36.0–46.0)
Hemoglobin: 12 g/dL (ref 12.0–15.0)
Lymphocytes Relative: 20.8 % (ref 12.0–46.0)
Lymphs Abs: 1.3 10*3/uL (ref 0.7–4.0)
MCHC: 32.7 g/dL (ref 30.0–36.0)
MCV: 93.3 fl (ref 78.0–100.0)
Monocytes Absolute: 0.5 10*3/uL (ref 0.1–1.0)
Monocytes Relative: 7.4 % (ref 3.0–12.0)
Neutro Abs: 4.3 10*3/uL (ref 1.4–7.7)
Neutrophils Relative %: 70 % (ref 43.0–77.0)
Platelets: 308 10*3/uL (ref 150.0–400.0)
RBC: 3.93 Mil/uL (ref 3.87–5.11)
RDW: 13.4 % (ref 11.5–15.5)
WBC: 6.1 10*3/uL (ref 4.0–10.5)

## 2019-04-21 LAB — TSH: TSH: 0.66 u[IU]/mL (ref 0.35–4.50)

## 2019-04-21 MED ORDER — MECLIZINE HCL 25 MG PO TABS: 25.0000 mg | ORAL_TABLET | Freq: Three times a day (TID) | ORAL | 0 refills | Status: DC | PRN

## 2019-04-21 NOTE — Telephone Encounter (Signed)
See note

## 2019-04-21 NOTE — Progress Notes (Signed)
Patient: Tammy Hughes MRN: [id number] DOB: 1932-08-10 PCP: Orma Flaming, MD     Subjective:  Chief Complaint  Patient presents with  . Dizziness  . Palpitations    HPI: The patient is a 85 y.o. female who presents today for dizziness. She states her symptoms started on Monday. She went to get up and felt dizzy. She only gets dizzy when she stands up from sitting. If she lays down it goes away. The dizziness only lasts about 3-4 minutes. She can't remember how the dizziness is (she is spinning or the world is spinning). No ear pain, no tinnitus right now, but had some yesterday. No sinus pain or pressure. She has had vertigo in the past and she thinks it feels the same. She also reports she was having palpitations that doesn't last long. She has not fallen with it and she does not get nauseated. She is drinking and eating okay.   Palpitations: echo done in July 2020, no aortic stenosis. She is having this intermittently. No leg swelling, coughing, DOE. No chest pain.   Review of Systems  Constitutional: Positive for fatigue.  Eyes: Negative for visual disturbance.  Respiratory: Negative for shortness of breath.   Cardiovascular: Positive for palpitations. Negative for chest pain and leg swelling.  Gastrointestinal: Negative for abdominal pain, diarrhea, nausea and vomiting.  Musculoskeletal: Positive for arthralgias.       C/o right knee pain  Neurological: Positive for dizziness and headaches.    Allergies Patient is allergic to pirfenidone and aspirin.  Past Medical History Patient  has a past medical history of Allergy, ANXIETY (03/08/2008), Cataract, Depression, DIVERTICULOSIS, COLON (03/08/2008), DVT (deep venous thrombosis) ([place]), GERD (02/23/2007), HYPERCHOLESTEROLEMIA (02/01/2008), HYPERTENSION (03/08/2008), Insulin dependent diabetes mellitus, OSTEOARTHRITIS (02/23/2007), OSTEOPOROSIS (02/23/2007), PERIPHERAL NEUROPATHY (02/23/2007), STD (sexually transmitted disease), Stomach  cancer ([place]) (07/20/14), Urinary incontinence, and Urolithiasis.  Surgical History Patient  has a past surgical history that includes Cholecystectomy (1995); Esophagogastroduodenoscopy (06/08/2002); Gastrectomy (N/A, 07/20/2014); laparoscopy (N/A, 07/20/2014); Gastrojejunostomy (N/A, 07/20/2014); Cardiac catheterization (N/A, 02/02/2016); and Abdominal surgery.  Family History Pateint's family history includes Cancer in her father, sister, and sister; Diabetes in her sister and sister; Other in her mother.  Social History Patient  reports that she has never smoked. She has never used smokeless tobacco. She reports that she does not drink alcohol or use drugs.    Objective: Vitals:   04/21/19 1035  BP: (!) 147/80  Pulse: (!) 59  Temp: 97.8 F (36.6 C)  TempSrc: Skin  SpO2: 95%  Weight: 129 lb 3.2 oz (58.6 kg)  Height: 5\' 1"  (1.549 m)    Body mass index is 24.41 kg/m.  Orthostatic VS for the past 24 hrs:  BP- Lying Pulse- Lying BP- Sitting Pulse- Sitting BP- Standing at 0 minutes Pulse- Standing at 0 minutes  04/21/19 1043 150/83 59 147/80 60 154/78 64     Physical Exam Vitals signs reviewed.  Constitutional:      Appearance: Normal appearance. She is normal weight.  HENT:     Head: Normocephalic and atraumatic.     Right Ear: Tympanic membrane, ear canal and external ear normal.     Left Ear: Tympanic membrane, ear canal and external ear normal.     Nose: Nose normal.     Mouth/Throat:     Mouth: Mucous membranes are moist.  Eyes:     Extraocular Movements: Extraocular movements intact.     Conjunctiva/sclera: Conjunctivae normal.     Pupils: Pupils are equal, round, and reactive  to light.  Neck:     Musculoskeletal: Normal range of motion and neck supple.     Vascular: No carotid bruit.  Cardiovascular:     Rate and Rhythm: Normal rate and regular rhythm.     Heart sounds: Murmur present.  Pulmonary:     Effort: Pulmonary effort is normal.     Breath sounds: Normal  breath sounds.  Abdominal:     General: Abdomen is flat. Bowel sounds are normal.     Palpations: Abdomen is soft.  Neurological:     General: No focal deficit present.     Mental Status: She is alert.     Comments: +dix halpike bilaterally     Ekg: sinus bradycardia with rate of 56.     Assessment/plan: 1. Intermittent palpitations Her heart rate is low. zio monitor to rule out SSS or possible arrhythmia. Echo recently done and reviewed. On no anti htn drugs.  Checking labs again and strict er precautions given.  - LONG TERM MONITOR-LIVE TELEMETRY (3-14 DAYS); Future - EKG 12-Lead - Comprehensive metabolic panel - CBC with Differential/Platelet - TSH  2. Dizziness Appears to be BPPV. No orthostatis on vitals today. Still recommended she take her time with positional changes. Handout given on exercises to do at home. Unsure how compliant she will be with this. If not better consider vestibular rehab if she can get to appointments. Meclizine prn. If not better in 2 weeks she is to let me know. Also working up heart.     Return if symptoms worsen or fail to improve.   Orma Flaming, MD [place]   04/21/2019

## 2019-04-21 NOTE — Patient Instructions (Addendum)
Concern for palpitations and low heart rate. Ordering a long term monitor that you will wear for 7 days to make sure your heart rate is not going too low or irregular.   Checking labs to make sure no other issue  Dizziness appears to be more vertigo. I need you to do home exercises and can start medication as needed. Also make sure drinking water and I want you to be very slow when standing up.   Benign Positional Vertigo Vertigo is the feeling that you or your surroundings are moving when they are not. Benign positional vertigo is the most common form of vertigo. This is usually a harmless condition (benign). This condition is positional. This means that symptoms are triggered by certain movements and positions. This condition can be dangerous if it occurs while you are doing something that could cause harm to you or others. This includes activities such as driving or operating machinery. What are the causes? In many cases, the cause of this condition is not known. It may be caused by a disturbance in an area of the inner ear that helps your brain to sense movement and balance. This disturbance can be caused by:  Viral infection (labyrinthitis).  Head injury.  Repetitive motion, such as jumping, dancing, or running. What increases the risk? You are more likely to develop this condition if:  You are a woman.  You are 83 years of age or older. What are the signs or symptoms? Symptoms of this condition usually happen when you move your head or your eyes in different directions. Symptoms may start suddenly, and usually last for less than a minute. They include:  Loss of balance and falling.  Feeling like you are spinning or moving.  Feeling like your surroundings are spinning or moving.  Nausea and vomiting.  Blurred vision.  Dizziness.  Involuntary eye movement (nystagmus). Symptoms can be mild and cause only minor problems, or they can be severe and interfere with daily life.  Episodes of benign positional vertigo may return (recur) over time. Symptoms may improve over time. How is this diagnosed? This condition may be diagnosed based on:  Your medical history.  Physical exam of the head, neck, and ears.  Tests, such as: ? MRI. ? CT scan. ? Eye movement tests. Your health care provider may ask you to change positions quickly while he or she watches you for symptoms of benign positional vertigo, such as nystagmus. Eye movement may be tested with a variety of exams that are designed to evaluate or stimulate vertigo. ? An electroencephalogram (EEG). This records electrical activity in your brain. ? Hearing tests. You may be referred to a health care provider who specializes in ear, nose, and throat (ENT) problems (otolaryngologist) or a provider who specializes in disorders of the nervous system (neurologist). How is this treated?  This condition may be treated in a session in which your health care provider moves your head in specific positions to adjust your inner ear back to normal. Treatment for this condition may take several sessions. Surgery may be needed in severe cases, but this is rare. In some cases, benign positional vertigo may resolve on its own in 2-4 weeks. Follow these instructions at home: Safety  Move slowly. Avoid sudden body or head movements or certain positions, as told by your health care provider.  Avoid driving until your health care provider says it is safe for you to do so.  Avoid operating heavy machinery until your health care provider  says it is safe for you to do so.  Avoid doing any tasks that would be dangerous to you or others if vertigo occurs.  If you have trouble walking or keeping your balance, try using a cane for stability. If you feel dizzy or unstable, sit down right away.  Return to your normal activities as told by your health care provider. Ask your health care provider what activities are safe for you. General  instructions  Take over-the-counter and prescription medicines only as told by your health care provider.  Drink enough fluid to keep your urine pale yellow.  Keep all follow-up visits as told by your health care provider. This is important. Contact a health care provider if:  You have a fever.  Your condition gets worse or you develop new symptoms.  Your family or friends notice any behavioral changes.  You have nausea or vomiting that gets worse.  You have numbness or a "pins and needles" sensation. Get help right away if you:  Have difficulty speaking or moving.  Are always dizzy.  Faint.  Develop severe headaches.  Have weakness in your legs or arms.  Have changes in your hearing or vision.  Develop a stiff neck.  Develop sensitivity to light. Summary  Vertigo is the feeling that you or your surroundings are moving when they are not. Benign positional vertigo is the most common form of vertigo.  The cause of this condition is not known. It may be caused by a disturbance in an area of the inner ear that helps your brain to sense movement and balance.  Symptoms include loss of balance and falling, feeling that you or your surroundings are moving, nausea and vomiting, and blurred vision.  This condition can be diagnosed based on symptoms, physical exam, and other tests, such as MRI, CT scan, eye movement tests, and hearing tests.  Follow safety instructions as told by your health care provider. You will also be told when to contact your health care provider in case of problems. This information is not intended to replace advice given to you by your health care provider. Make sure you discuss any questions you have with your health care provider. Document Released: 04/08/2006 Document Revised: 12/10/2017 Document Reviewed: 12/10/2017 Elsevier Patient Education  2020 [employer].

## 2019-04-27 ENCOUNTER — Other Ambulatory Visit (INDEPENDENT_AMBULATORY_CARE_PROVIDER_SITE_OTHER): Payer: Medicare Other

## 2019-04-27 ENCOUNTER — Other Ambulatory Visit: Payer: Self-pay

## 2019-04-28 ENCOUNTER — Other Ambulatory Visit: Payer: Self-pay | Admitting: Endocrinology

## 2019-04-28 DIAGNOSIS — Z794 Long term (current) use of insulin: Secondary | ICD-10-CM

## 2019-04-28 DIAGNOSIS — E119 Type 2 diabetes mellitus without complications: Secondary | ICD-10-CM

## 2019-04-28 LAB — PTH, INTACT AND CALCIUM
Calcium: 10.7 mg/dL — ABNORMAL HIGH (ref 8.6–10.4)
PTH: 27 pg/mL (ref 14–64)

## 2019-05-03 ENCOUNTER — Telehealth: Payer: Self-pay | Admitting: Family Medicine

## 2019-05-03 NOTE — Telephone Encounter (Signed)
Copied from [place] [id number]. Topic: Quick Communication - Lab Results (Clinic Use ONLY) >> Apr 30, 2019  2:11 PM Zellmer, Clearnce Sorrel, CMA wrote: Called patient to inform them of 10/13 lab results. When patient returns call, triage nurse may disclose results.

## 2019-05-03 NOTE — Telephone Encounter (Signed)
Called patient to inform them of 10/13 lab results. When patient returns call, triage nurse may disclose results.  Daughter Rip Harbour calling back for lab resuts

## 2019-05-04 ENCOUNTER — Telehealth: Payer: Self-pay | Admitting: *Deleted

## 2019-05-04 NOTE — Telephone Encounter (Signed)
7 day ZIO AT long term monitor- Live Telemetry mailed to patients home address.  Instructions reviewed briefly as they are included in the monitor kit.

## 2019-05-06 ENCOUNTER — Other Ambulatory Visit: Payer: Self-pay | Admitting: Family Medicine

## 2019-05-07 NOTE — Telephone Encounter (Signed)
I don't re-fill plavix.  This will need to be filled by patient's PCP.

## 2019-05-07 NOTE — Telephone Encounter (Signed)
Routing to [facility] for Plavix refills

## 2019-05-08 ENCOUNTER — Ambulatory Visit (INDEPENDENT_AMBULATORY_CARE_PROVIDER_SITE_OTHER): Payer: Medicare Other

## 2019-05-08 DIAGNOSIS — R002 Palpitations: Secondary | ICD-10-CM | POA: Diagnosis not present

## 2019-05-13 ENCOUNTER — Telehealth: Payer: Self-pay | Admitting: Family Medicine

## 2019-05-13 NOTE — Telephone Encounter (Signed)
I left a message asking the patient and spouse to call and schedule Medicare AWV with Loma Sousa ([place]).  If patient calls back, please schedule Medicare Wellness Visit at next available opening.  VDM (Dee-Dee)

## 2019-05-26 ENCOUNTER — Other Ambulatory Visit: Payer: Self-pay | Admitting: Family Medicine

## 2019-05-26 DIAGNOSIS — R002 Palpitations: Secondary | ICD-10-CM

## 2019-06-01 ENCOUNTER — Other Ambulatory Visit: Payer: Self-pay

## 2019-06-03 ENCOUNTER — Encounter: Payer: Self-pay | Admitting: Endocrinology

## 2019-06-03 ENCOUNTER — Ambulatory Visit (INDEPENDENT_AMBULATORY_CARE_PROVIDER_SITE_OTHER): Payer: Medicare Other | Admitting: Endocrinology

## 2019-06-03 ENCOUNTER — Other Ambulatory Visit: Payer: Self-pay

## 2019-06-03 VITALS — BP 124/70 | HR 71 | Ht 61.0 in | Wt 130.4 lb

## 2019-06-03 DIAGNOSIS — Z794 Long term (current) use of insulin: Secondary | ICD-10-CM | POA: Diagnosis not present

## 2019-06-03 DIAGNOSIS — E1142 Type 2 diabetes mellitus with diabetic polyneuropathy: Secondary | ICD-10-CM

## 2019-06-03 DIAGNOSIS — E119 Type 2 diabetes mellitus without complications: Secondary | ICD-10-CM

## 2019-06-03 LAB — POCT GLYCOSYLATED HEMOGLOBIN (HGB A1C): Hemoglobin A1C: 7 % — AB (ref 4.0–5.6)

## 2019-06-03 MED ORDER — NOVOLOG MIX 70/30 FLEXPEN (70-30) 100 UNIT/ML ~~LOC~~ SUPN: PEN_INJECTOR | SUBCUTANEOUS | 3 refills | Status: DC

## 2019-06-03 NOTE — Patient Instructions (Addendum)
Please reduce the insulin to 21 units with breakfast, and 5 units with supper.  check your blood sugar twice a day.  vary the time of day when you check, between before the 3 meals, and at bedtime.  also check if you have symptoms of your blood sugar being too high or too low.  please keep a record of the readings and bring it to your next appointment here (or you can bring the meter itself).  You can write it on any piece of paper.  please call us sooner if your blood sugar goes below 70, or if you have a lot of readings over 200.   Please come back for a follow-up appointment in 3 months.

## 2019-06-03 NOTE — Progress Notes (Signed)
Subjective:    Patient ID: Tammy Hughes, female    DOB: Dec 10, 1932, 85 y.o.   MRN: [id number]  HPI Pt returns for f/u of diabetes mellitus:  DM type: Insulin-requiring type 2.  [id number].   Complications: polyneuropathy and CAD.  Therapy: insulin since 2005.  GDM: never.   DKA: never.   Severe hypoglycemia: never.   Pancreatitis: never.  Other: she chose bid premixed insulin.   Interval history: no cbg record, but states  it varies from 125-200.  There is no trend throughout the day. pt states she feels well in general.   Past Medical History:  Diagnosis Date  . Allergy    SEASONAL  . ANXIETY 03/08/2008  . Cataract    REMOVED BILATERAL  . Depression   . DIVERTICULOSIS, COLON 03/08/2008  . DVT (deep venous thrombosis) ([place])   . GERD 02/23/2007  . HYPERCHOLESTEROLEMIA 02/01/2008  . HYPERTENSION 03/08/2008  . Insulin dependent diabetes mellitus   . OSTEOARTHRITIS 02/23/2007  . OSTEOPOROSIS 02/23/2007  . PERIPHERAL NEUROPATHY 02/23/2007  . STD (sexually transmitted disease)   . Stomach cancer ([place]) 07/20/14   invasive adenocarcinoma w/signet rings features  . Urinary incontinence   . Urolithiasis     Past Surgical History:  Procedure Laterality Date  . ABDOMINAL SURGERY    . CARDIAC CATHETERIZATION N/A 02/02/2016   Procedure: Left Heart Cath and Coronary Angiography;  Surgeon: Peter M Martinique, MD;  Location: [facility];  Service: Cardiovascular;  Laterality: N/A;  . CHOLECYSTECTOMY  1995  . ESOPHAGOGASTRODUODENOSCOPY  06/08/2002  . GASTRECTOMY N/A 07/20/2014   Procedure: PROXIMAL GASTRECTOMY;  Surgeon: Stark Klein, MD;  Location: [facility];  Service: General;  Laterality: N/A;  . GASTROJEJUNOSTOMY N/A 07/20/2014   Procedure: GASTROJEJUNOSTOMY;  Surgeon: Stark Klein, MD;  Location: [facility];  Service: General;  Laterality: N/A;  . LAPAROSCOPY N/A 07/20/2014   Procedure: LAPAROSCOPY DIAGNOSTIC;  Surgeon: Stark Klein, MD;  Location: [facility];  Service: General;  Laterality: N/A;     Social History   Socioeconomic History  . Marital status: Married    Spouse name: Not on file  . Number of children: Not on file  . Years of education: Not on file  . Highest education level: Not on file  Occupational History  . Occupation: Retired    Fish farm manager: RETIRED  Social Needs  . Financial resource strain: Not on file  . Food insecurity    Worry: Not on file    Inability: Not on file  . Transportation needs    Medical: Not on file    Non-medical: Not on file  Tobacco Use  . Smoking status: Never Smoker  . Smokeless tobacco: Never Used  Substance and Sexual Activity  . Alcohol use: No    Alcohol/week: 0.0 standard drinks  . Drug use: No  . Sexual activity: Not Currently    Birth control/protection: Post-menopausal  Lifestyle  . Physical activity    Days per week: Not on file    Minutes per session: Not on file  . Stress: Not on file  Relationships  . Social Herbalist on phone: Not on file    Gets together: Not on file    Attends religious service: Not on file    Active member of club or organization: Not on file    Attends meetings of clubs or organizations: Not on file    Relationship status: Not on file  . Intimate partner violence    Fear of  current or ex partner: Not on file    Emotionally abused: Not on file    Physically abused: Not on file    Forced sexual activity: Not on file  Other Topics Concern  . Not on file  Social History Narrative   Dentist       Current Outpatient Medications on File Prior to Visit  Medication Sig Dispense Refill  . Acetaminophen (TYLENOL PO) Take 500 mg by mouth 2 (two) times daily.     . calcium carbonate (TUMS - DOSED IN MG ELEMENTAL CALCIUM) 500 MG chewable tablet Chew 1 tablet by mouth daily.    . clopidogrel (PLAVIX) 75 MG tablet TAKE 1 TABLET BY MOUTH ONCE DAILY 90 tablet 1  . diclofenac sodium (VOLTAREN) 1 % GEL Apply 4 g topically 4 (four) times daily. For knee pain 100 g 11  .  esomeprazole (NEXIUM) 40 MG capsule TAKE 1 CAPSULE DAILY AT    NOON 30 capsule 8  . glucose blood (ONETOUCH VERIO) test strip USE 1 STRIP TO CHECK GLUCOSE TWICE DAILY 100 each 0  . meclizine (ANTIVERT) 25 MG tablet Take 1 tablet (25 mg total) by mouth 3 (three) times daily as needed for dizziness. 30 tablet 0  . mirtazapine (REMERON) 15 MG tablet TAKE 1 TABLET BY MOUTH EACH NIGHT AT BEDTIME AS DIRECTED 90 tablet 3  . Vitamin D, Ergocalciferol, (DRISDOL) 1.25 MG (50000 UT) CAPS capsule One capsule by mouth once a week for 8 weeks. Then take 1000IU/day 8 capsule 0   No current facility-administered medications on file prior to visit.     Allergies  Allergen Reactions  . Pirfenidone Diarrhea and Nausea And Vomiting  . Aspirin     rash    Family History  Problem Relation Age of Onset  . Cancer Father        Prostate Cancer  . Cancer Sister        Breast Cancer  . Diabetes Sister   . Diabetes Sister   . Other Mother        pneumonia   . Cancer Sister        breast cancer     BP 124/70 (BP Location: Left Arm, Patient Position: Sitting, Cuff Size: Normal)   Pulse 71   Ht 5\' 1"  (1.549 m)   Wt 130 lb 6.4 oz (59.1 kg)   SpO2 97%   BMI 24.64 kg/m    Review of Systems She denies hypoglycemia.      Objective:   Physical Exam VITAL SIGNS:  See vs page GENERAL: no distress Pulses: dorsalis pedis intact bilat.   MSK: no deformity of the feet CV: no leg edema Skin:  no ulcer on the feet.  normal color and temp on the feet. Neuro: sensation is intact to touch on the feet  Lab Results  Component Value Date   CREATININE 0.93 04/21/2019   BUN 18 04/21/2019   NA 142 04/21/2019   K 4.0 04/21/2019   CL 105 04/21/2019   CO2 30 04/21/2019   Lab Results  Component Value Date   HGBA1C 7.0 (A) 06/03/2019        Assessment & Plan:  Insulin-requiring type 2 DM, with PN: overcontrolled, given this regimen, which does match insulin to her changing needs throughout the day   Patient Instructions  Please reduce the insulin to 21 units with breakfast, and 5 units with supper.  check your blood sugar twice a day.  vary the time of day when  you check, between before the 3 meals, and at bedtime.  also check if you have symptoms of your blood sugar being too high or too low.  please keep a record of the readings and bring it to your next appointment here (or you can bring the meter itself).  You can write it on any piece of paper.  please call us sooner if your blood sugar goes below 70, or if you have a lot of readings over 200.   Please come back for a follow-up appointment in 3 months.

## 2019-06-16 ENCOUNTER — Other Ambulatory Visit: Payer: Self-pay | Admitting: Endocrinology

## 2019-06-16 DIAGNOSIS — E119 Type 2 diabetes mellitus without complications: Secondary | ICD-10-CM

## 2019-06-17 ENCOUNTER — Ambulatory Visit (INDEPENDENT_AMBULATORY_CARE_PROVIDER_SITE_OTHER): Payer: Medicare Other

## 2019-06-17 ENCOUNTER — Other Ambulatory Visit: Payer: Self-pay

## 2019-06-17 VITALS — BP 110/58 | HR 70 | Temp 98.3°F | Ht 61.0 in | Wt 128.8 lb

## 2019-06-17 DIAGNOSIS — Z0000 Encounter for general adult medical examination without abnormal findings: Secondary | ICD-10-CM

## 2019-06-17 NOTE — Progress Notes (Signed)
I have reviewed the documentation from the recent AWV done by Courtney Slade; I agree with the documentation and will follow up on any recommendations or abnormal findings as suggested. Telesha Deguzman, MD [facility]    

## 2019-06-17 NOTE — Progress Notes (Signed)
Subjective:   Tammy Hughes is a 85 y.o. female who presents for Medicare Annual (Subsequent) preventive examination.  Review of Systems:   Cardiac Risk Factors include: advanced age (>91men, >75 women);dyslipidemia;diabetes mellitus;hypertension    Objective:     Vitals: BP (!) 110/58 (BP Location: Left Arm, Patient Position: Sitting, Cuff Size: Normal)   Pulse 70   Temp 98.3 F (36.8 C) (Temporal)   Ht 5\' 1"  (1.549 m)   Wt 128 lb 12.8 oz (58.4 kg)   SpO2 94%   BMI 24.34 kg/m   Body mass index is 24.34 kg/m.  Advanced Directives 06/17/2019 08/24/2018 11/21/2017 11/24/2016 10/24/2016 06/25/2016 02/01/2016  Does Patient Have a Medical Advance Directive? No No No No No No No  Would patient like information on creating a medical advance directive? Yes (MAU/Ambulatory/Procedural Areas - Information given) No - Patient declined - - No - Patient declined - No - patient declined information    Tobacco Social History   Tobacco Use  Smoking Status Never Smoker  Smokeless Tobacco Never Used     Counseling given: Not Answered   Clinical Intake:  Pre-visit preparation completed: Yes  Pain : No/denies pain  Diabetes: Yes(followed by endocrinology) CBG done?: No Did pt. bring in CBG monitor from home?: No  How often do you need to have someone help you when you read instructions, pamphlets, or other written materials from your doctor or pharmacy?: 2 - Rarely  Interpreter Needed?: No  Information entered by :: Denman George LPN  Past Medical History:  Diagnosis Date  . Allergy    SEASONAL  . ANXIETY 03/08/2008  . Cataract    REMOVED BILATERAL  . Depression   . DIVERTICULOSIS, COLON 03/08/2008  . DVT (deep venous thrombosis) ([place])   . GERD 02/23/2007  . HYPERCHOLESTEROLEMIA 02/01/2008  . HYPERTENSION 03/08/2008  . Insulin dependent diabetes mellitus   . OSTEOARTHRITIS 02/23/2007  . OSTEOPOROSIS 02/23/2007  . PERIPHERAL NEUROPATHY 02/23/2007  . STD (sexually transmitted  disease)   . Stomach cancer ([place]) 07/20/14   invasive adenocarcinoma w/signet rings features  . Urinary incontinence   . Urolithiasis    Past Surgical History:  Procedure Laterality Date  . ABDOMINAL SURGERY    . CARDIAC CATHETERIZATION N/A 02/02/2016   Procedure: Left Heart Cath and Coronary Angiography;  Surgeon: Peter M Martinique, MD;  Location: [facility];  Service: Cardiovascular;  Laterality: N/A;  . CHOLECYSTECTOMY  1995  . ESOPHAGOGASTRODUODENOSCOPY  06/08/2002  . GASTRECTOMY N/A 07/20/2014   Procedure: PROXIMAL GASTRECTOMY;  Surgeon: Stark Klein, MD;  Location: [facility];  Service: General;  Laterality: N/A;  . GASTROJEJUNOSTOMY N/A 07/20/2014   Procedure: GASTROJEJUNOSTOMY;  Surgeon: Stark Klein, MD;  Location: [facility];  Service: General;  Laterality: N/A;  . LAPAROSCOPY N/A 07/20/2014   Procedure: LAPAROSCOPY DIAGNOSTIC;  Surgeon: Stark Klein, MD;  Location: [facility];  Service: General;  Laterality: N/A;   Family History  Problem Relation Age of Onset  . Cancer Father        Prostate Cancer  . Cancer Sister        Breast Cancer  . Diabetes Sister   . Diabetes Sister   . Other Mother        pneumonia   . Cancer Sister        breast cancer    Social History   Socioeconomic History  . Marital status: Married    Spouse name: Not on file  . Number of children: Not on file  .  Years of education: Not on file  . Highest education level: Not on file  Occupational History  . Occupation: Retired    Fish farm manager: RETIRED  Social Needs  . Financial resource strain: Not on file  . Food insecurity    Worry: Not on file    Inability: Not on file  . Transportation needs    Medical: Not on file    Non-medical: Not on file  Tobacco Use  . Smoking status: Never Smoker  . Smokeless tobacco: Never Used  Substance and Sexual Activity  . Alcohol use: No    Alcohol/week: 0.0 standard drinks  . Drug use: No  . Sexual activity: Not Currently    Birth control/protection: Post-menopausal   Lifestyle  . Physical activity    Days per week: Not on file    Minutes per session: Not on file  . Stress: Not on file  Relationships  . Social Herbalist on phone: Not on file    Gets together: Not on file    Attends religious service: Not on file    Active member of club or organization: Not on file    Attends meetings of clubs or organizations: Not on file    Relationship status: Not on file  Other Topics Concern  . Not on file  Social History Narrative   Dentist       Outpatient Encounter Medications as of 06/17/2019  Medication Sig  . Acetaminophen (TYLENOL PO) Take 500 mg by mouth 2 (two) times daily.   . calcium carbonate (TUMS - DOSED IN MG ELEMENTAL CALCIUM) 500 MG chewable tablet Chew 1 tablet by mouth daily.  . clopidogrel (PLAVIX) 75 MG tablet TAKE 1 TABLET BY MOUTH ONCE DAILY  . diclofenac sodium (VOLTAREN) 1 % GEL Apply 4 g topically 4 (four) times daily. For knee pain  . esomeprazole (NEXIUM) 40 MG capsule TAKE 1 CAPSULE DAILY AT    NOON  . insulin aspart protamine - aspart (NOVOLOG MIX 70/30 FLEXPEN) (70-30) 100 UNIT/ML FlexPen 21 units with breakfast, and 5 units with supper, and pen needles 2/day  . meclizine (ANTIVERT) 25 MG tablet Take 1 tablet (25 mg total) by mouth 3 (three) times daily as needed for dizziness.  . mirtazapine (REMERON) 15 MG tablet TAKE 1 TABLET BY MOUTH EACH NIGHT AT BEDTIME AS DIRECTED  . ONETOUCH VERIO test strip USE 1 STRIP TO CHECK GLUCOSE TWICE DAILY  . Vitamin D, Ergocalciferol, (DRISDOL) 1.25 MG (50000 UT) CAPS capsule One capsule by mouth once a week for 8 weeks. Then take 1000IU/day   No facility-administered encounter medications on file as of 06/17/2019.     Activities of Daily Living In your present state of health, do you have any difficulty performing the following activities: 06/17/2019  Hearing? N  Vision? N  Difficulty concentrating or making decisions? N  Walking or climbing stairs? N   Dressing or bathing? N  Doing errands, shopping? N  Preparing Food and eating ? N  Using the Toilet? N  In the past six months, have you accidently leaked urine? N  Do you have problems with loss of bowel control? N  Managing your Medications? N  Managing your Finances? N  Housekeeping or managing your Housekeeping? N  Some recent data might be hidden    Patient Care Team: Orma Flaming, MD as PCP - General (Family Medicine) Elouise Munroe, MD as PCP - Cardiology (Cardiology) Deneise Lever, MD as Attending Physician (Pulmonary Disease) Deatra Ina,  Sandy Salaam, MD (Inactive) as Attending Physician (Gastroenterology) Elsie Stain, MD as Consulting Physician (Pulmonary Disease) Ladell Pier, MD as Consulting Physician (Oncology) Renato Shin, MD as Consulting Physician (Endocrinology) Myra Gianotti  as Consulting Physician (Optometry)    Assessment:   This is a routine wellness examination for [place].  Exercise Activities and Dietary recommendations Current Exercise Habits: The patient does not participate in regular exercise at present  Goals    . Patient Stated     Maintain independence.        Fall Risk Fall Risk  06/17/2019 12/31/2018 08/13/2018 05/04/2015 09/21/2014  Falls in the past year? 0 0 0 No No  Number falls in past yr: - 0 - - -  Injury with Fall? 0 0 - - -  Follow up Education provided;Falls prevention discussed;Falls evaluation completed Falls evaluation completed - - -   Is the patient's home free of loose throw rugs in walkways, pet beds, electrical cords, etc?   yes      Grab bars in the bathroom? yes      Handrails on the stairs?   yes      Adequate lighting?   yes  Timed Get Up and Go performed: completed and within normal timeframe; no gait abnormalities noted   Depression Screen PHQ 2/9 Scores 06/17/2019 08/13/2018 05/04/2015 09/21/2014  PHQ - 2 Score 0 0 0 -  Exception Documentation - - - Medical reason     Cognitive Function- no  cognitive concerns at this time      6CIT Screen 06/17/2019  What Year? 0 points  What month? 0 points  What time? 0 points  Count back from 20 0 points  Months in reverse 0 points  Repeat phrase 2 points  Total Score 2    Immunization History  Administered Date(s) Administered  . Fluad Quad(high Dose 65+) 03/19/2019  . Influenza Split 07/15/2009, 07/15/2010, 05/16/2011, 04/16/2012, 05/15/2013  . Influenza Whole 04/11/2008, 03/15/2010  . Influenza, High Dose Seasonal PF 05/27/2017, 04/24/2018  . Influenza,inj,Quad PF,6+ Mos 04/06/2014, 03/28/2015, 03/15/2016  . Influenza-Unspecified 04/13/2013  . Pneumococcal Conjugate-13 11/12/2013  . Pneumococcal Polysaccharide-23 07/15/1993, 07/15/2004, 07/23/2011  . Td 01/12/1998, 07/17/2007  . Tdap 11/21/2017  . Zoster 06/25/2016    Qualifies for Shingles Vaccine?Discussed and patient will check with pharmacy for coverage.  Patient education handout provided   Screening Tests Health Maintenance  Topic Date Due  . DEXA SCAN  04/12/2000  . OPHTHALMOLOGY EXAM  12/23/2018  . FOOT EXAM  08/04/2019  . HEMOGLOBIN A1C  12/01/2019  . URINE MICROALBUMIN  03/18/2020  . TETANUS/TDAP  11/22/2027  . INFLUENZA VACCINE  Completed  . PNA vac Low Risk Adult  Completed  . MAMMOGRAM  Discontinued    Cancer Screenings: Lung: Low Dose CT Chest recommended if Age 13-80 years, 30 pack-year currently smoking OR have quit w/in 15years. Patient does not qualify. Breast:  Up to date on Mammogram? Yes   Up to date of Bone Density/Dexa? No; patient declines at this time  Colorectal: No longer indicated; last colonoscopy 05/24/10    Plan:  I have personally reviewed and addressed the Medicare Annual Wellness questionnaire and have noted the following in the patient's chart:  A. Medical and social history B. Use of alcohol, tobacco or illicit drugs  C. Current medications and supplements D. Functional ability and status E.  Nutritional status F.   Physical activity G. Advance directives H. List of other physicians I.  Hospitalizations, surgeries, and ER visits in  previous 12 months J.  [place] such as hearing and vision if needed, cognitive and depression L. Referrals, records requested, and appointments- patient will self refer for diabetic eye exam   In addition, I have reviewed and discussed with patient certain preventive protocols, quality metrics, and best practice recommendations. A written personalized care plan for preventive services as well as general preventive health recommendations were provided to patient.   Signed,  Denman George, LPN  Nurse Health Advisor   Nurse Notes: Will patient need to keep appointment for visit on 07/05/19?  Please advise.

## 2019-06-17 NOTE — Patient Instructions (Signed)
Tammy Hughes , Thank you for taking time to come for your Medicare Wellness Visit. I appreciate your ongoing commitment to your health goals. Please review the following plan we discussed and let me know if I can assist you in the future.   Screening recommendations/referrals: Colorectal Screening: up to date; last colonoscopy 05/24/10 Mammogram: No longer indicated Bone Density: No longer indicated   Vision and Dental Exams: Recommended annual ophthalmology exams for early detection of glaucoma and other disorders of the eye Recommended annual dental exams for proper oral hygiene  Diabetic Exams: Diabetic Eye Exam: recommended yearly Diabetic Foot Exam: recommended yearly; up to date  Vaccinations: Influenza vaccine: completed 03/19/19  Pneumococcal vaccine: Up to date; last 11/12/13 Tdap vaccine: up to date; last 11/21/17 Shingles vaccine: Please call your insurance company to determine your out of pocket expense for the Shingrix vaccine. You may receive this vaccine at your local pharmacy.  Advanced directives:Please bring a copy of your POA (Power of Attorney) and/or Living Will to your next appointment.  Goals: Recommend to drink at least 6-8 8oz glasses of water per day and consume a balanced diet rich in fresh fruits and vegetables.   Next appointment: Please schedule your Annual Wellness Visit with your Nurse Health Advisor in one year.  Preventive Care 54 Years and Older, Female Preventive care refers to lifestyle choices and visits with your health care provider that can promote health and wellness. What does preventive care include?  A yearly physical exam. This is also called an annual well check.  Dental exams once or twice a year.  Routine eye exams. Ask your health care provider how often you should have your eyes checked.  Personal lifestyle choices, including:  Daily care of your teeth and gums.  Regular physical activity.  Eating a healthy diet.  Avoiding  tobacco and drug use.  Limiting alcohol use.  Practicing safe sex.  Taking low-dose aspirin every day if recommended by your health care provider.  Taking vitamin and mineral supplements as recommended by your health care provider. What happens during an annual well check? The services and screenings done by your health care provider during your annual well check will depend on your age, overall health, lifestyle risk factors, and family history of disease. Counseling  Your health care provider may ask you questions about your:  Alcohol use.  Tobacco use.  Drug use.  Emotional well-being.  Home and relationship well-being.  Sexual activity.  Eating habits.  History of falls.  Memory and ability to understand (cognition).  Work and work Statistician.  Reproductive health. Screening  You may have the following tests or measurements:  Height, weight, and BMI.  Blood pressure.  Lipid and cholesterol levels. These may be checked every 5 years, or more frequently if you are over 73 years old.  Skin check.  Lung cancer screening. You may have this screening every year starting at age 2 if you have a 30-pack-year history of smoking and currently smoke or have quit within the past 15 years.  Fecal occult blood test (FOBT) of the stool. You may have this test every year starting at age 64.  Flexible sigmoidoscopy or colonoscopy. You may have a sigmoidoscopy every 5 years or a colonoscopy every 10 years starting at age 36.  Hepatitis C blood test.  Hepatitis B blood test.  Sexually transmitted disease (STD) testing.  Diabetes screening. This is done by checking your blood sugar (glucose) after you have not eaten for a while (fasting).  You may have this done every 1-3 years.  Bone density scan. This is done to screen for osteoporosis. You may have this done starting at age 77.  Mammogram. This may be done every 1-2 years. Talk to your health care provider about how  often you should have regular mammograms. Talk with your health care provider about your test results, treatment options, and if necessary, the need for more tests. Vaccines  Your health care provider may recommend certain vaccines, such as:  Influenza vaccine. This is recommended every year.  Tetanus, diphtheria, and acellular pertussis (Tdap, Td) vaccine. You may need a Td booster every 10 years.  Zoster vaccine. You may need this after age 60.  Pneumococcal 13-valent conjugate (PCV13) vaccine. One dose is recommended after age 45.  Pneumococcal polysaccharide (PPSV23) vaccine. One dose is recommended after age 56. Talk to your health care provider about which screenings and vaccines you need and how often you need them. This information is not intended to replace advice given to you by your health care provider. Make sure you discuss any questions you have with your health care provider. Document Released: 07/28/2015 Document Revised: 03/20/2016 Document Reviewed: 05/02/2015 Elsevier Interactive Patient Education  2017 [place] Prevention in the Home Falls can cause injuries. They can happen to people of all ages. There are many things you can do to make your home safe and to help prevent falls. What can I do on the outside of my home?  Regularly fix the edges of walkways and driveways and fix any cracks.  Remove anything that might make you trip as you walk through a door, such as a raised step or threshold.  Trim any bushes or trees on the path to your home.  Use bright outdoor lighting.  Clear any walking paths of anything that might make someone trip, such as rocks or tools.  Regularly check to see if handrails are loose or broken. Make sure that both sides of any steps have handrails.  Any raised decks and porches should have guardrails on the edges.  Have any leaves, snow, or ice cleared regularly.  Use sand or salt on walking paths during winter.  Clean  up any spills in your garage right away. This includes oil or grease spills. What can I do in the bathroom?  Use night lights.  Install grab bars by the toilet and in the tub and shower. Do not use towel bars as grab bars.  Use non-skid mats or decals in the tub or shower.  If you need to sit down in the shower, use a plastic, non-slip stool.  Keep the floor dry. Clean up any water that spills on the floor as soon as it happens.  Remove soap buildup in the tub or shower regularly.  Attach bath mats securely with double-sided non-slip rug tape.  Do not have throw rugs and other things on the floor that can make you trip. What can I do in the bedroom?  Use night lights.  Make sure that you have a light by your bed that is easy to reach.  Do not use any sheets or blankets that are too big for your bed. They should not hang down onto the floor.  Have a firm chair that has side arms. You can use this for support while you get dressed.  Do not have throw rugs and other things on the floor that can make you trip. What can I do in the kitchen?  Clean up any spills right away.  Avoid walking on wet floors.  Keep items that you use a lot in easy-to-reach places.  If you need to reach something above you, use a strong step stool that has a grab bar.  Keep electrical cords out of the way.  Do not use floor polish or wax that makes floors slippery. If you must use wax, use non-skid floor wax.  Do not have throw rugs and other things on the floor that can make you trip. What can I do with my stairs?  Do not leave any items on the stairs.  Make sure that there are handrails on both sides of the stairs and use them. Fix handrails that are broken or loose. Make sure that handrails are as long as the stairways.  Check any carpeting to make sure that it is firmly attached to the stairs. Fix any carpet that is loose or worn.  Avoid having throw rugs at the top or bottom of the stairs.  If you do have throw rugs, attach them to the floor with carpet tape.  Make sure that you have a light switch at the top of the stairs and the bottom of the stairs. If you do not have them, ask someone to add them for you. What else can I do to help prevent falls?  Wear shoes that:  Do not have high heels.  Have rubber bottoms.  Are comfortable and fit you well.  Are closed at the toe. Do not wear sandals.  If you use a stepladder:  Make sure that it is fully opened. Do not climb a closed stepladder.  Make sure that both sides of the stepladder are locked into place.  Ask someone to hold it for you, if possible.  Clearly mark and make sure that you can see:  Any grab bars or handrails.  First and last steps.  Where the edge of each step is.  Use tools that help you move around (mobility aids) if they are needed. These include:  Canes.  Walkers.  Scooters.  Crutches.  Turn on the lights when you go into a dark area. Replace any light bulbs as soon as they burn out.  Set up your furniture so you have a clear path. Avoid moving your furniture around.  If any of your floors are uneven, fix them.  If there are any pets around you, be aware of where they are.  Review your medicines with your doctor. Some medicines can make you feel dizzy. This can increase your chance of falling. Ask your doctor what other things that you can do to help prevent falls. This information is not intended to replace advice given to you by your health care provider. Make sure you discuss any questions you have with your health care provider. Document Released: 04/27/2009 Document Revised: 12/07/2015 Document Reviewed: 08/05/2014 Elsevier Interactive Patient Education  2017 [employer].

## 2019-07-05 ENCOUNTER — Other Ambulatory Visit: Payer: Self-pay

## 2019-07-05 ENCOUNTER — Encounter: Payer: Self-pay | Admitting: Family Medicine

## 2019-07-05 ENCOUNTER — Ambulatory Visit (INDEPENDENT_AMBULATORY_CARE_PROVIDER_SITE_OTHER): Payer: Medicare Other | Admitting: Family Medicine

## 2019-07-05 VITALS — BP 122/58 | HR 60 | Temp 97.8°F | Ht 61.0 in | Wt 128.4 lb

## 2019-07-05 DIAGNOSIS — E7800 Pure hypercholesterolemia, unspecified: Secondary | ICD-10-CM

## 2019-07-05 DIAGNOSIS — E538 Deficiency of other specified B group vitamins: Secondary | ICD-10-CM | POA: Diagnosis not present

## 2019-07-05 LAB — VITAMIN B12: Vitamin B-12: 1317 pg/mL — ABNORMAL HIGH (ref 211–911)

## 2019-07-05 MED ORDER — ROSUVASTATIN CALCIUM 5 MG PO TABS: 5.0000 mg | ORAL_TABLET | Freq: Every day | ORAL | 3 refills | Status: DC

## 2019-07-05 MED ORDER — MECLIZINE HCL 25 MG PO TABS: 25.0000 mg | ORAL_TABLET | Freq: Three times a day (TID) | ORAL | 0 refills | Status: DC | PRN

## 2019-07-05 NOTE — Patient Instructions (Signed)
-  we are going to do a low dose cholesterol drug called crestor that you will take once/night. If you have any muscle cramping or pain, stomach pain or dark urine please let me know. You are 85 years of age, but with your history of diabetes, TIA and heart disease I think we should start this to decrease risk.   Also rechecking calcium. Make sure you are not taking more than 600mg  of calcium/day. Try to move around more and make sure drinking water. If calcium is still high will send to endocrinologist for further work up.   Merry christmas!  Dr. Rogers Blocker

## 2019-07-05 NOTE — Progress Notes (Signed)
Patient: Tammy Hughes MRN: [id number] DOB: 1933/06/27 PCP: Orma Flaming, MD     Subjective:  Chief Complaint  Patient presents with  . Diabetes  . discuss cholesterol meds    HPI: The patient is a 85 y.o. female who presents today for follow up on diabetes and discuss beginning cholesterol medication and hypercalcemia.   Hyperlipidemia: Tammy Hughes has a diagnosis of TIA, diabetes, CAD and hyperlipidemia and is on no statin. We are here to discuss starting statin vs. Not due to her age.   Hypercalcemia: slowly trending upward. pth normal. She does take over the counter calcium, but isn't sure how much she is taking. She is also more sedentary. Asymptomatic.    Review of Systems  Constitutional: Negative for fatigue.  Eyes: Negative for visual disturbance.  Respiratory: Negative for shortness of breath.   Cardiovascular: Negative for chest pain, palpitations and leg swelling.  Gastrointestinal: Negative for abdominal pain, constipation, diarrhea, nausea and vomiting.  Musculoskeletal: Positive for arthralgias.       C/o pain in knees.  L > R  Skin: Negative for rash.  Neurological: Negative for dizziness, numbness and headaches.  Psychiatric/Behavioral: Negative for sleep disturbance.    Allergies Patient is allergic to pirfenidone and aspirin.  Past Medical History Patient  has a past medical history of Allergy, ANXIETY (03/08/2008), Cataract, Depression, DIVERTICULOSIS, COLON (03/08/2008), DVT (deep venous thrombosis) (Tammy Hughes), GERD (02/23/2007), HYPERCHOLESTEROLEMIA (02/01/2008), HYPERTENSION (03/08/2008), Insulin dependent diabetes mellitus, OSTEOARTHRITIS (02/23/2007), OSTEOPOROSIS (02/23/2007), PERIPHERAL NEUROPATHY (02/23/2007), STD (sexually transmitted disease), Stomach cancer (Tammy Hughes) (07/20/14), Urinary incontinence, and Urolithiasis.  Surgical History Patient  has a past surgical history that includes Cholecystectomy (1995); Esophagogastroduodenoscopy (06/08/2002); Gastrectomy  (N/A, 07/20/2014); laparoscopy (N/A, 07/20/2014); Gastrojejunostomy (N/A, 07/20/2014); Cardiac catheterization (N/A, 02/02/2016); and Abdominal surgery.  Family History Pateint's family history includes Cancer in her father, sister, and sister; Diabetes in her sister and sister; Other in her mother.  Social History Patient  reports that she has never smoked. She has never used smokeless tobacco. She reports that she does not drink alcohol or use drugs.    Objective: Vitals:   07/05/19 0933  BP: (!) 122/58  Pulse: 60  Temp: 97.8 F (36.6 C)  TempSrc: Skin  SpO2: 95%  Weight: 128 lb 6.4 oz (58.2 kg)  Height: 5\' 1"  (1.549 m)    Body mass index is 24.26 kg/m.  Physical Exam Vitals reviewed.  Constitutional:      Appearance: Normal appearance. She is normal weight.  HENT:     Head: Normocephalic and atraumatic.  Cardiovascular:     Rate and Rhythm: Normal rate and regular rhythm.     Heart sounds: Normal heart sounds.  Pulmonary:     Effort: Pulmonary effort is normal.     Breath sounds: Normal breath sounds.  Abdominal:     General: Abdomen is flat. Bowel sounds are normal.     Palpations: Abdomen is soft.  Neurological:     General: No focal deficit present.     Mental Status: She is alert and oriented to person, place, and time.  Psychiatric:        Mood and Affect: Mood normal.        Behavior: Behavior normal.        Assessment/plan: 1. HYPERCHOLESTEROLEMIA Due to hx of CAD/TIA and diabetes im going to start a statin. She is okay with this and her daughter wanted to do this when she spoke to nurse. Any side effects she is to stop and let me know.  These were reviewed. Recheck labs at f/u.   2. B12 deficiency  - Vitamin B12  3. Hypercalcemia Repeat. ? If due to sedentary lifestyle. Increase water and make sure calcium is 600mg /day.  - Comprehensive metabolic panel    This visit occurred during the SARS-CoV-2 public health emergency.  Safety protocols were in  place, including screening questions prior to the visit, additional usage of staff PPE, and extensive cleaning of exam room while observing appropriate contact time as indicated for disinfecting solutions.    Return if symptoms worsen or fail to improve.   Orma Flaming, MD [place]   07/05/2019

## 2019-07-06 LAB — COMPREHENSIVE METABOLIC PANEL
ALT: 9 U/L (ref 0–35)
AST: 16 U/L (ref 0–37)
Albumin: 3.7 g/dL (ref 3.5–5.2)
Alkaline Phosphatase: 82 U/L (ref 39–117)
BUN: 16 mg/dL (ref 6–23)
CO2: 26 mEq/L (ref 19–32)
Calcium: 10.1 mg/dL (ref 8.4–10.5)
Chloride: 107 mEq/L (ref 96–112)
Creatinine, Ser: 0.92 mg/dL (ref 0.40–1.20)
GFR: 70.33 mL/min (ref >60.00)
Glucose, Bld: 81 mg/dL (ref 70–99)
Potassium: 4 mEq/L (ref 3.5–5.1)
Sodium: 143 mEq/L (ref 135–145)
Total Bilirubin: 0.1 mg/dL — ABNORMAL LOW (ref 0.2–1.2)
Total Protein: 7.2 g/dL (ref 6.0–8.3)

## 2019-07-23 ENCOUNTER — Encounter: Payer: Self-pay | Admitting: Internal Medicine

## 2019-07-23 ENCOUNTER — Ambulatory Visit (INDEPENDENT_AMBULATORY_CARE_PROVIDER_SITE_OTHER): Payer: Medicare Other | Admitting: Internal Medicine

## 2019-07-23 ENCOUNTER — Other Ambulatory Visit: Payer: Self-pay

## 2019-07-23 VITALS — BP 148/81 | HR 67 | Ht 61.0 in | Wt 128.4 lb

## 2019-07-23 DIAGNOSIS — I10 Essential (primary) hypertension: Secondary | ICD-10-CM | POA: Diagnosis not present

## 2019-07-23 DIAGNOSIS — R42 Dizziness and giddiness: Secondary | ICD-10-CM | POA: Diagnosis not present

## 2019-07-23 NOTE — Patient Instructions (Signed)
Medication Instructions:  Your physician recommends that you continue on your current medications as directed. Please refer to the Current Medication list given to you today.  *If you need a refill on your cardiac medications before your next appointment, please call your pharmacy*  Lab Work: NONE If you have labs (blood work) drawn today and your tests are completely normal, you will receive your results only by: Marland Kitchen MyChart Message (if you have MyChart) OR . A paper copy in the mail If you have any lab test that is abnormal or we need to change your treatment, we will call you to review the results.  Testing/Procedures: NONE  Follow-Up: At [facility], you and your health needs are our priority.  As part of our continuing mission to provide you with exceptional heart care, we have created designated Provider Care Teams.  These Care Teams include your primary Cardiologist (physician) and Advanced Practice Providers (APPs -  Physician Assistants and Nurse Practitioners) who all work together to provide you with the care you need, when you need it.  Your next appointment:   6 month(s)  The format for your next appointment:   In Person  Provider:   Cherlynn Kaiser, MD

## 2019-07-23 NOTE — Progress Notes (Signed)
Cardiology Office Note:    Date:  07/23/2019   ID:  ZEBA CERIO, DOB 24-Apr-1933, MRN [id number]  PCP:  Orma Flaming, MD  Cardiologist:  Elouise Munroe, MD  Electrophysiologist:  None   Referring MD: Orma Flaming, MD   Chief Complaint: Cardiovascular follow-up, dizziness  History of Present Illness:    Tammy Hughes is a 86 y.o. female with a hx of diabetes on insulin, hyperlipidemia, prior gastric adenocarcinoma, who presents today for follow-up.  She tells me her main concerns today are low back pain and early morning dizziness.  She notes that she previously took Tylenol for low back pain but has stopped taking it.  She uses Voltaren gel for her knees and uses this on her back, it is somewhat helpful but does not entirely take away the pain.  No known trauma, no concomitant chest pain or shortness of breath.  She also endorses significant dizziness in the morning.  She feels that this may coincide with the initiation of mirtazapine which she takes for sleep.  Mirtazapine does seem to help her quite a bit with her sleeping, prior to starting she was having difficulty getting a restful night sleep.  With the dizziness she does not endorse low pulse or hypotension. We have dealt with bradycardia in the past while on beta blocker.   Past Medical History:  Diagnosis Date  . Allergy    SEASONAL  . ANXIETY 03/08/2008  . Cataract    REMOVED BILATERAL  . Depression   . DIVERTICULOSIS, COLON 03/08/2008  . DVT (deep venous thrombosis) ([place])   . GERD 02/23/2007  . HYPERCHOLESTEROLEMIA 02/01/2008  . HYPERTENSION 03/08/2008  . Insulin dependent diabetes mellitus   . OSTEOARTHRITIS 02/23/2007  . OSTEOPOROSIS 02/23/2007  . PERIPHERAL NEUROPATHY 02/23/2007  . STD (sexually transmitted disease)   . Stomach cancer ([place]) 07/20/14   invasive adenocarcinoma w/signet rings features  . Urinary incontinence   . Urolithiasis     Past Surgical History:  Procedure Laterality Date  . ABDOMINAL  SURGERY    . CARDIAC CATHETERIZATION N/A 02/02/2016   Procedure: Left Heart Cath and Coronary Angiography;  Surgeon: Peter M Martinique, MD;  Location: [facility];  Service: Cardiovascular;  Laterality: N/A;  . CHOLECYSTECTOMY  1995  . ESOPHAGOGASTRODUODENOSCOPY  06/08/2002  . GASTRECTOMY N/A 07/20/2014   Procedure: PROXIMAL GASTRECTOMY;  Surgeon: Stark Klein, MD;  Location: [place] [facility];  Service: General;  Laterality: N/A;  . GASTROJEJUNOSTOMY N/A 07/20/2014   Procedure: GASTROJEJUNOSTOMY;  Surgeon: Stark Klein, MD;  Location: [place];  Service: General;  Laterality: N/A;  . LAPAROSCOPY N/A 07/20/2014   Procedure: LAPAROSCOPY DIAGNOSTIC;  Surgeon: Stark Klein, MD;  Location: [facility];  Service: General;  Laterality: N/A;    Current Medications: Current Meds  Medication Sig  . Acetaminophen (TYLENOL PO) Take 500 mg by mouth 2 (two) times daily.   . calcium carbonate (TUMS - DOSED IN MG ELEMENTAL CALCIUM) 500 MG chewable tablet Chew 1 tablet by mouth daily.  . cholecalciferol (VITAMIN D3) 25 MCG (1000 UT) tablet Take 1,000 Units by mouth daily.  . clopidogrel (PLAVIX) 75 MG tablet TAKE 1 TABLET BY MOUTH ONCE DAILY  . diclofenac sodium (VOLTAREN) 1 % GEL Apply 4 g topically 4 (four) times daily. For knee pain  . insulin aspart protamine - aspart (NOVOLOG MIX 70/30 FLEXPEN) (70-30) 100 UNIT/ML FlexPen 21 units with breakfast, and 5 units with supper, and pen needles 2/day  . meclizine (ANTIVERT) 25 MG tablet Take 1  tablet (25 mg total) by mouth 3 (three) times daily as needed for dizziness.  . mirtazapine (REMERON) 15 MG tablet TAKE 1 TABLET BY MOUTH EACH NIGHT AT BEDTIME AS DIRECTED  . rosuvastatin (CRESTOR) 5 MG tablet Take 1 tablet (5 mg total) by mouth daily.  . [DISCONTINUED] esomeprazole (NEXIUM) 40 MG capsule TAKE 1 CAPSULE DAILY AT    NOON  . [DISCONTINUED] ONETOUCH VERIO test strip USE 1 STRIP TO CHECK GLUCOSE TWICE DAILY     Allergies:   Pirfenidone and Aspirin   Social History    Socioeconomic History  . Marital status: Married    Spouse name: Not on file  . Number of children: Not on file  . Years of education: Not on file  . Highest education level: Not on file  Occupational History  . Occupation: Retired    Fish farm manager: RETIRED  Tobacco Use  . Smoking status: Never Smoker  . Smokeless tobacco: Never Used  Substance and Sexual Activity  . Alcohol use: No    Alcohol/week: 0.0 standard drinks  . Drug use: No  . Sexual activity: Not Currently    Birth control/protection: Post-menopausal  Other Topics Concern  . Not on file  Social History Narrative   Dentist      Social Determinants of Health   Financial Resource Strain:   . Difficulty of Paying Living Expenses: Not on file  Food Insecurity:   . Worried About Charity fundraiser in the Last Year: Not on file  . Ran Out of Food in the Last Year: Not on file  Transportation Needs:   . Lack of Transportation (Medical): Not on file  . Lack of Transportation (Non-Medical): Not on file  Physical Activity:   . Days of Exercise per Week: Not on file  . Minutes of Exercise per Session: Not on file  Stress:   . Feeling of Stress : Not on file  Social Connections:   . Frequency of Communication with Friends and Family: Not on file  . Frequency of Social Gatherings with Friends and Family: Not on file  . Attends Religious Services: Not on file  . Active Member of Clubs or Organizations: Not on file  . Attends Archivist Meetings: Not on file  . Marital Status: Not on file     Family History: The patient's family history includes Cancer in her father, sister, and sister; Diabetes in her sister and sister; Other in her mother.  ROS:   Please see the history of present illness.    All other systems reviewed and are negative.  EKGs/Labs/Other Studies Reviewed:    The following studies were reviewed today:  EKG:  n/a  Recent Labs: 04/21/2019: Hemoglobin 12.0; Platelets  308.0; TSH 0.66 07/05/2019: ALT 9; BUN 16; Creatinine, Ser 0.92; Potassium 4.0; Sodium 143  Recent Lipid Panel    Component Value Date/Time   CHOL 221 (H) 03/19/2019 1121   TRIG 215.0 (H) 03/19/2019 1121   HDL 41.60 03/19/2019 1121   CHOLHDL 5 03/19/2019 1121   VLDL 43.0 (H) 03/19/2019 1121   LDLCALC 96 11/21/2017 1439   LDLDIRECT 159.0 03/19/2019 1121    Physical Exam:    VS:  BP (!) 148/81   Pulse 67   Ht 5\' 1"  (1.549 m)   Wt 128 lb 6.4 oz (58.2 kg)   SpO2 97%   BMI 24.26 kg/m     Wt Readings from Last 5 Encounters:  07/23/19 128 lb 6.4 oz (58.2 kg)  07/05/19  128 lb 6.4 oz (58.2 kg)  06/17/19 128 lb 12.8 oz (58.4 kg)  06/03/19 130 lb 6.4 oz (59.1 kg)  04/21/19 129 lb 3.2 oz (58.6 kg)     Constitutional: No acute distress Eyes: sclera non-icteric, normal conjunctiva and lids ENMT: normal dentition, moist mucous membranes Cardiovascular: regular rhythm, normal rate, no murmurs. S1 and S2 normal. Radial pulses normal bilaterally. No jugular venous distention.  Respiratory: clear to auscultation bilaterally GI : normal bowel sounds, soft and nontender. No distention.   MSK: extremities warm, well perfused. No edema.  NEURO: grossly nonfocal exam, moves all extremities. PSYCH: alert and oriented x 3, normal mood and affect.   ASSESSMENT:    1. Dizziness   2. Essential hypertension    PLAN:    Dizziness- I have asked her to speak to her PMD about dizziness and perhaps if it is related to initiation of mirtazipine. She will do so. No definite cardiac source noted.  HTN - mildly above goal today, with dizziness no change to therapy today. Patient and I participated in shared decision making and will observe at this time.   Back pain - low back and seems mechanical, discuss with PMD  Total time of encounter: 30 minutes total time of encounter, including 20 minutes spent in face-to-face patient care. This time includes coordination of care and counseling regarding  above issues. Remainder of non-face-to-face time involved reviewing chart documents/testing relevant to the patient encounter and documentation in the medical record.  Cherlynn Kaiser, MD [place]  [facility]   Medication Adjustments/Labs and Tests Ordered: Current medicines are reviewed at length with the patient today.  Concerns regarding medicines are outlined above.  No orders of the defined types were placed in this encounter.  No orders of the defined types were placed in this encounter.   Patient Instructions  Medication Instructions:  Your physician recommends that you continue on your current medications as directed. Please refer to the Current Medication list given to you today.  *If you need a refill on your cardiac medications before your next appointment, please call your pharmacy*  Lab Work: NONE If you have labs (blood work) drawn today and your tests are completely normal, you will receive your results only by: Marland Kitchen MyChart Message (if you have MyChart) OR . A paper copy in the mail If you have any lab test that is abnormal or we need to change your treatment, we will call you to review the results.  Testing/Procedures: NONE  Follow-Up: At [facility], you and your health needs are our priority.  As part of our continuing mission to provide you with exceptional heart care, we have created designated Provider Care Teams.  These Care Teams include your primary Cardiologist (physician) and Advanced Practice Providers (APPs -  Physician Assistants and Nurse Practitioners) who all work together to provide you with the care you need, when you need it.  Your next appointment:   6 month(s)  The format for your next appointment:   In Person  Provider:   Cherlynn Kaiser, MD

## 2019-07-31 ENCOUNTER — Other Ambulatory Visit: Payer: Self-pay | Admitting: Endocrinology

## 2019-07-31 DIAGNOSIS — E119 Type 2 diabetes mellitus without complications: Secondary | ICD-10-CM

## 2019-08-11 ENCOUNTER — Other Ambulatory Visit: Payer: Self-pay | Admitting: Family Medicine

## 2019-08-12 NOTE — Telephone Encounter (Signed)
Last OV 07/05/19 Last refill 11/26/18 #30/8 Next OV 09/20/19

## 2019-08-26 ENCOUNTER — Inpatient Hospital Stay: Payer: Medicare Other | Attending: Oncology | Admitting: Oncology

## 2019-08-26 ENCOUNTER — Other Ambulatory Visit: Payer: Self-pay

## 2019-08-26 VITALS — BP 131/79 | HR 71 | Temp 98.5°F | Resp 18 | Ht 61.0 in | Wt 126.7 lb

## 2019-08-26 DIAGNOSIS — Z86718 Personal history of other venous thrombosis and embolism: Secondary | ICD-10-CM | POA: Insufficient documentation

## 2019-08-26 DIAGNOSIS — C169 Malignant neoplasm of stomach, unspecified: Secondary | ICD-10-CM | POA: Diagnosis not present

## 2019-08-26 DIAGNOSIS — Z9221 Personal history of antineoplastic chemotherapy: Secondary | ICD-10-CM | POA: Insufficient documentation

## 2019-08-26 DIAGNOSIS — Z79899 Other long term (current) drug therapy: Secondary | ICD-10-CM | POA: Insufficient documentation

## 2019-08-26 DIAGNOSIS — J8410 Pulmonary fibrosis, unspecified: Secondary | ICD-10-CM | POA: Diagnosis not present

## 2019-08-26 DIAGNOSIS — I252 Old myocardial infarction: Secondary | ICD-10-CM | POA: Diagnosis not present

## 2019-08-26 DIAGNOSIS — Z923 Personal history of irradiation: Secondary | ICD-10-CM | POA: Diagnosis not present

## 2019-08-26 DIAGNOSIS — Z85028 Personal history of other malignant neoplasm of stomach: Secondary | ICD-10-CM | POA: Diagnosis not present

## 2019-08-26 DIAGNOSIS — F329 Major depressive disorder, single episode, unspecified: Secondary | ICD-10-CM | POA: Insufficient documentation

## 2019-08-26 DIAGNOSIS — E119 Type 2 diabetes mellitus without complications: Secondary | ICD-10-CM | POA: Diagnosis not present

## 2019-08-26 NOTE — Progress Notes (Signed)
  [place] OFFICE PROGRESS NOTE   Diagnosis: Gastric cancer  INTERVAL HISTORY:   Ms. Dressler returns for scheduled visit.  She feels well.  Good appetite.  No bleeding.  She reports "dizziness "when bending over.  Objective:  Vital signs in last 24 hours:  Blood pressure 131/79, pulse 71, temperature 98.5 F (36.9 C), temperature source Temporal, resp. rate 18, height 5\' 1"  (1.549 m), weight 126 lb 11.2 oz (57.5 kg), SpO2 98 %.     Lymphatics: No cervical, supraclavicular, axillary, or inguinal nodes Resp: Diffuse inspiratory rales Cardio: Regular rate and rhythm GI: No hepatosplenomegaly, no mass Vascular: No leg edema, the right lower leg is slightly larger than the left side   Lab Results:  Lab Results  Component Value Date   WBC 6.1 04/21/2019   HGB 12.0 04/21/2019   HCT 36.6 04/21/2019   MCV 93.3 04/21/2019   PLT 308.0 04/21/2019   NEUTROABS 4.3 04/21/2019    CMP  Lab Results  Component Value Date   NA 143 07/05/2019   K 4.0 07/05/2019   CL 107 07/05/2019   CO2 26 07/05/2019   GLUCOSE 81 07/05/2019   BUN 16 07/05/2019   CREATININE 0.92 07/05/2019   CALCIUM 10.1 07/05/2019   PROT 7.2 07/05/2019   ALBUMIN 3.7 07/05/2019   AST 16 07/05/2019   ALT 9 07/05/2019   ALKPHOS 82 07/05/2019   BILITOT 0.1 (L) 07/05/2019   GFRNONAA 48 (L) 11/24/2016   GFRAA 56 (L) 11/24/2016     Medications: I have reviewed the patient's current medications.   Assessment/Plan: 1. Gastric cancer, status post an endoscopic biopsy of a lesser curvature mass on 05/31/2014 confirming adenocarcinoma ? Staging CTs of the chest and abdomen on 06/08/2014 revealed no evidence of metastatic disease ? Proximal gastrectomy and feeding jejunostomy 07/20/2014, pT2,pN1 ? Adjuvant weekly 5-Fu/leucovorin 09/14/2014, 09/21/2014, 09/28/2014 and 10/05/2014 ? Initiation of radiation and infusional 5-FU 10/17/2014 ? 5-fluorouracil pump discontinued on 10/27/2014 due to  toxicity. ? Radiation discontinued 11/02/2014  2. Pulmonary fibrosis 3. Esophageal stricture, status post a dilatation procedure 05/31/2014 4. Diabetes 5. Postoperative ventricle tachycardia, NSTEMI January 2016 6. Hospital-acquired pneumonia January 2016 7. CT abdomen/pelvis 09/02/2014 with postsurgical changes. Focal fluid collection in the midline abutting the distal greater curvature of the stomach measuring 2.2 x 2.9 x 3.4 cm. Mild stranding in the adjacent fat. 8. PICC placement 10/17/2014 9. Rash. Most likely related to the 5-fluorouracil. Resolved. 10. Right upper extremity DVT 10/25/2014. She completed a course of Xarelto. 11. Hospitalization 10/27/2014 through 10/28/2014 nausea/vomiting and left arm discomfort. Noted to have skin toxicity from the 5-fluorouracil. The 5-fluorouracil was discontinued. 12. Depression. She continues Remeron.    Disposition: Tammy Hughes is in remission from gastric cancer.  She would like to continue follow-up at the cancer center.  She will return for an office visit in 6 months.  I encouraged her to obtain the COVID-19 vaccine.  [name], MD  08/26/2019  11:12 AM

## 2019-08-31 ENCOUNTER — Telehealth: Payer: Self-pay | Admitting: Oncology

## 2019-08-31 NOTE — Telephone Encounter (Signed)
Scheduled per los. Called and left msg. Mailed printout  °

## 2019-09-02 ENCOUNTER — Other Ambulatory Visit: Payer: Self-pay | Admitting: Family Medicine

## 2019-09-03 ENCOUNTER — Telehealth: Payer: Self-pay | Admitting: Family Medicine

## 2019-09-03 NOTE — Telephone Encounter (Signed)
Please let her know that I received a fax from [facility] regarding genetic testing for heart issues. This needs to be faxed to her cardiologist please as I do not hav eher cardiac history and some of the boxes checked do not apply to her that I am aware. Would have her call company [phone] and fax to her cardiologist, dr. Margaretann Loveless.  Thanks, dr. Rogers Blocker

## 2019-09-03 NOTE — Telephone Encounter (Signed)
Spoke with patient to give message below. She states that if Dr Rogers Blocker doesn't know what it is about than she is not going to fool with it. She says that insurance people call her all the time and she is tired of dealing with it.  I advised her to take the number and call her insurance company to have it faxed to Dr. Margaretann Loveless. She declines and says that, if they want to talk to her they'll call her back. Number was given.

## 2019-09-09 ENCOUNTER — Ambulatory Visit: Payer: Medicare Other | Admitting: Endocrinology

## 2019-09-09 ENCOUNTER — Other Ambulatory Visit: Payer: Self-pay

## 2019-09-09 DIAGNOSIS — Z23 Encounter for immunization: Secondary | ICD-10-CM | POA: Diagnosis not present

## 2019-09-13 ENCOUNTER — Encounter: Payer: Self-pay | Admitting: Endocrinology

## 2019-09-13 ENCOUNTER — Other Ambulatory Visit: Payer: Self-pay

## 2019-09-13 ENCOUNTER — Ambulatory Visit (INDEPENDENT_AMBULATORY_CARE_PROVIDER_SITE_OTHER): Payer: Medicare Other | Admitting: Endocrinology

## 2019-09-13 VITALS — BP 130/72 | HR 75 | Ht 61.0 in | Wt 126.0 lb

## 2019-09-13 DIAGNOSIS — E1142 Type 2 diabetes mellitus with diabetic polyneuropathy: Secondary | ICD-10-CM | POA: Diagnosis not present

## 2019-09-13 DIAGNOSIS — E119 Type 2 diabetes mellitus without complications: Secondary | ICD-10-CM | POA: Diagnosis not present

## 2019-09-13 DIAGNOSIS — Z794 Long term (current) use of insulin: Secondary | ICD-10-CM

## 2019-09-13 LAB — POCT GLYCOSYLATED HEMOGLOBIN (HGB A1C): Hemoglobin A1C: 7.6 % — AB (ref 4.0–5.6)

## 2019-09-13 MED ORDER — NOVOLOG MIX 70/30 FLEXPEN (70-30) 100 UNIT/ML ~~LOC~~ SUPN: 26.0000 [IU] | PEN_INJECTOR | Freq: Every day | SUBCUTANEOUS | 3 refills | Status: DC

## 2019-09-13 NOTE — Patient Instructions (Addendum)
Please change the insulin to 26 units with breakfast, and none with supper.  On this type of insulin schedule, you should eat meals on a regular schedule.  If a meal is missed or significantly delayed, your blood sugar could go low. check your blood sugar twice a day.  vary the time of day when you check, between before the 3 meals, and at bedtime.  also check if you have symptoms of your blood sugar being too high or too low.  please keep a record of the readings and bring it to your next appointment here (or you can bring the meter itself).  You can write it on any piece of paper.  please call us sooner if your blood sugar goes below 70, or if you have a lot of readings over 200. Please come back for a follow-up appointment in 3 months.

## 2019-09-13 NOTE — Progress Notes (Signed)
Subjective:    Patient ID: Tammy Hughes, female    DOB: 06-13-1933, 86 y.o.   MRN: [id number]  HPI Pt returns for f/u of diabetes mellitus:  DM type: Insulin-requiring type 2.  [id number].   Complications: polyneuropathy and CAD.  Therapy: insulin since 2005.  GDM: never.   DKA: never.   Severe hypoglycemia: never.   Pancreatitis: never.  Other: she chose bid premixed insulin.   Interval history:  she brings her meter with her cbg's which I have reviewed today.  cbg varies from 132-400.  Past Medical History:  Diagnosis Date  . Allergy    SEASONAL  . ANXIETY 03/08/2008  . Cataract    REMOVED BILATERAL  . Depression   . DIVERTICULOSIS, COLON 03/08/2008  . DVT (deep venous thrombosis) ([place])   . GERD 02/23/2007  . HYPERCHOLESTEROLEMIA 02/01/2008  . HYPERTENSION 03/08/2008  . Insulin dependent diabetes mellitus   . OSTEOARTHRITIS 02/23/2007  . OSTEOPOROSIS 02/23/2007  . PERIPHERAL NEUROPATHY 02/23/2007  . STD (sexually transmitted disease)   . Stomach cancer ([place]) 07/20/14   invasive adenocarcinoma w/signet rings features  . Urinary incontinence   . Urolithiasis     Past Surgical History:  Procedure Laterality Date  . ABDOMINAL SURGERY    . CARDIAC CATHETERIZATION N/A 02/02/2016   Procedure: Left Heart Cath and Coronary Angiography;  Surgeon: Peter M Martinique, MD;  Location: [facility];  Service: Cardiovascular;  Laterality: N/A;  . CHOLECYSTECTOMY  1995  . ESOPHAGOGASTRODUODENOSCOPY  06/08/2002  . GASTRECTOMY N/A 07/20/2014   Procedure: PROXIMAL GASTRECTOMY;  Surgeon: Stark Klein, MD;  Location: [facility];  Service: General;  Laterality: N/A;  . GASTROJEJUNOSTOMY N/A 07/20/2014   Procedure: GASTROJEJUNOSTOMY;  Surgeon: Stark Klein, MD;  Location: [facility];  Service: General;  Laterality: N/A;  . LAPAROSCOPY N/A 07/20/2014   Procedure: LAPAROSCOPY DIAGNOSTIC;  Surgeon: Stark Klein, MD;  Location: [facility];  Service: General;  Laterality: N/A;    Social History   Socioeconomic  History  . Marital status: Married    Spouse name: Not on file  . Number of children: Not on file  . Years of education: Not on file  . Highest education level: Not on file  Occupational History  . Occupation: Retired    Fish farm manager: RETIRED  Tobacco Use  . Smoking status: Never Smoker  . Smokeless tobacco: Never Used  Substance and Sexual Activity  . Alcohol use: No    Alcohol/week: 0.0 standard drinks  . Drug use: No  . Sexual activity: Not Currently    Birth control/protection: Post-menopausal  Other Topics Concern  . Not on file  Social History Narrative   Dentist      Social Determinants of Health   Financial Resource Strain:   . Difficulty of Paying Living Expenses: Not on file  Food Insecurity:   . Worried About Charity fundraiser in the Last Year: Not on file  . Ran Out of Food in the Last Year: Not on file  Transportation Needs:   . Lack of Transportation (Medical): Not on file  . Lack of Transportation (Non-Medical): Not on file  Physical Activity:   . Days of Exercise per Week: Not on file  . Minutes of Exercise per Session: Not on file  Stress:   . Feeling of Stress : Not on file  Social Connections:   . Frequency of Communication with Friends and Family: Not on file  . Frequency of Social Gatherings with Friends and Family: Not  on file  . Attends Religious Services: Not on file  . Active Member of Clubs or Organizations: Not on file  . Attends Archivist Meetings: Not on file  . Marital Status: Not on file  Intimate Partner Violence:   . Fear of Current or Ex-Partner: Not on file  . Emotionally Abused: Not on file  . Physically Abused: Not on file  . Sexually Abused: Not on file    Current Outpatient Medications on File Prior to Visit  Medication Sig Dispense Refill  . Acetaminophen (TYLENOL PO) Take 500 mg by mouth 2 (two) times daily.     . calcium carbonate (TUMS - DOSED IN MG ELEMENTAL CALCIUM) 500 MG chewable tablet  Chew 1 tablet by mouth daily.    . cholecalciferol (VITAMIN D3) 25 MCG (1000 UT) tablet Take 1,000 Units by mouth daily.    . clopidogrel (PLAVIX) 75 MG tablet TAKE 1 TABLET BY MOUTH ONCE DAILY 90 tablet 1  . diclofenac sodium (VOLTAREN) 1 % GEL Apply 4 g topically 4 (four) times daily. For knee pain 100 g 11  . esomeprazole (NEXIUM) 40 MG capsule TAKE 1 CAPSULE BY MOUTH EACH DAY AT NOON 30 capsule 8  . meclizine (ANTIVERT) 25 MG tablet TAKE 1 TABLET BY MOUTH THREE TIMES DAILY IF NEEDED FOR DIZZINESS 30 tablet 0  . mirtazapine (REMERON) 15 MG tablet TAKE 1 TABLET BY MOUTH EACH NIGHT AT BEDTIME AS DIRECTED 90 tablet 3  . ONETOUCH VERIO test strip USE 1 STRIP TO CHECK GLUCOSE TWICE DAILY 100 each 0  . rosuvastatin (CRESTOR) 5 MG tablet Take 1 tablet (5 mg total) by mouth daily. 90 tablet 3   No current facility-administered medications on file prior to visit.    Allergies  Allergen Reactions  . Pirfenidone Diarrhea and Nausea And Vomiting  . Aspirin     rash    Family History  Problem Relation Age of Onset  . Cancer Father        Prostate Cancer  . Cancer Sister        Breast Cancer  . Diabetes Sister   . Diabetes Sister   . Other Mother        pneumonia   . Cancer Sister        breast cancer     BP 130/72 (BP Location: Left Arm, Patient Position: Sitting, Cuff Size: Normal)   Pulse 75   Ht 5\' 1"  (1.549 m)   Wt 126 lb (57.2 kg)   SpO2 97%   BMI 23.81 kg/m   Review of Systems She denies hypoglycemia    Objective:   Physical Exam VITAL SIGNS:  See vs page GENERAL: no distress Pulses: dorsalis pedis intact bilat.   MSK: no deformity of the feet CV: trace bilat leg edema Skin:  no ulcer on the feet.  normal color and temp on the feet. Neuro: sensation is intact to touch on the feet  Lab Results  Component Value Date   CREATININE 0.92 07/05/2019   BUN 16 07/05/2019   NA 143 07/05/2019   K 4.0 07/05/2019   CL 107 07/05/2019   CO2 26 07/05/2019     Lab Results   Component Value Date   HGBA1C 7.6 (A) 09/13/2019       Assessment & Plan:  Insulin-requiring type 2 DM: Based on the pattern of her cbg's, she needs all of her insulin in the morning.   Patient Instructions  Please change the insulin to 26 units with  breakfast, and none with supper.  On this type of insulin schedule, you should eat meals on a regular schedule.  If a meal is missed or significantly delayed, your blood sugar could go low. check your blood sugar twice a day.  vary the time of day when you check, between before the 3 meals, and at bedtime.  also check if you have symptoms of your blood sugar being too high or too low.  please keep a record of the readings and bring it to your next appointment here (or you can bring the meter itself).  You can write it on any piece of paper.  please call us sooner if your blood sugar goes below 70, or if you have a lot of readings over 200. Please come back for a follow-up appointment in 3 months.

## 2019-09-17 ENCOUNTER — Other Ambulatory Visit: Payer: Self-pay

## 2019-09-20 ENCOUNTER — Other Ambulatory Visit: Payer: Self-pay

## 2019-09-20 ENCOUNTER — Ambulatory Visit (INDEPENDENT_AMBULATORY_CARE_PROVIDER_SITE_OTHER): Payer: Medicare Other | Admitting: Family Medicine

## 2019-09-20 ENCOUNTER — Encounter: Payer: Self-pay | Admitting: Family Medicine

## 2019-09-20 VITALS — BP 144/73 | HR 57 | Temp 97.3°F | Ht 61.0 in | Wt 126.8 lb

## 2019-09-20 DIAGNOSIS — R413 Other amnesia: Secondary | ICD-10-CM

## 2019-09-20 DIAGNOSIS — E7800 Pure hypercholesterolemia, unspecified: Secondary | ICD-10-CM | POA: Diagnosis not present

## 2019-09-20 DIAGNOSIS — I10 Essential (primary) hypertension: Secondary | ICD-10-CM | POA: Diagnosis not present

## 2019-09-20 DIAGNOSIS — E559 Vitamin D deficiency, unspecified: Secondary | ICD-10-CM

## 2019-09-20 LAB — CBC WITH DIFFERENTIAL/PLATELET
Basophils Absolute: 0 10*3/uL (ref 0.0–0.1)
Basophils Relative: 0.5 % (ref 0.0–3.0)
Eosinophils Absolute: 0.1 10*3/uL (ref 0.0–0.7)
Eosinophils Relative: 2.4 % (ref 0.0–5.0)
HCT: 36.3 % (ref 36.0–46.0)
Hemoglobin: 11.8 g/dL — ABNORMAL LOW (ref 12.0–15.0)
Lymphocytes Relative: 34.2 % (ref 12.0–46.0)
Lymphs Abs: 1.7 10*3/uL (ref 0.7–4.0)
MCHC: 32.6 g/dL (ref 30.0–36.0)
MCV: 91 fl (ref 78.0–100.0)
Monocytes Absolute: 0.4 10*3/uL (ref 0.1–1.0)
Monocytes Relative: 7.6 % (ref 3.0–12.0)
Neutro Abs: 2.7 10*3/uL (ref 1.4–7.7)
Neutrophils Relative %: 55.3 % (ref 43.0–77.0)
Platelets: 311 10*3/uL (ref 150.0–400.0)
RBC: 3.99 Mil/uL (ref 3.87–5.11)
RDW: 13.6 % (ref 11.5–15.5)
WBC: 4.9 10*3/uL (ref 4.0–10.5)

## 2019-09-20 LAB — POCT URINALYSIS DIPSTICK
Bilirubin, UA: NEGATIVE
Blood, UA: NEGATIVE
Glucose, UA: POSITIVE — AB
Ketones, UA: NEGATIVE
Leukocytes, UA: NEGATIVE
Nitrite, UA: NEGATIVE
Protein, UA: POSITIVE — AB
Spec Grav, UA: 1.02 (ref 1.010–1.025)
Urobilinogen, UA: 0.2 E.U./dL
pH, UA: 5.5 (ref 5.0–8.0)

## 2019-09-20 LAB — LIPID PANEL
Cholesterol: 118 mg/dL (ref 0–200)
HDL: 42 mg/dL (ref >39.00)
LDL Cholesterol: 52 mg/dL (ref 0–99)
NonHDL: 76.07
Total CHOL/HDL Ratio: 3
Triglycerides: 119 mg/dL (ref 0.0–149.0)
VLDL: 23.8 mg/dL (ref 0.0–40.0)

## 2019-09-20 LAB — COMPREHENSIVE METABOLIC PANEL
ALT: 13 U/L (ref 0–35)
AST: 17 U/L (ref 0–37)
Albumin: 3.7 g/dL (ref 3.5–5.2)
Alkaline Phosphatase: 86 U/L (ref 39–117)
BUN: 15 mg/dL (ref 6–23)
CO2: 30 mEq/L (ref 19–32)
Calcium: 10.2 mg/dL (ref 8.4–10.5)
Chloride: 105 mEq/L (ref 96–112)
Creatinine, Ser: 0.95 mg/dL (ref 0.40–1.20)
GFR: 67.74 mL/min (ref >60.00)
Glucose, Bld: 202 mg/dL — ABNORMAL HIGH (ref 70–99)
Potassium: 4.2 mEq/L (ref 3.5–5.1)
Sodium: 140 mEq/L (ref 135–145)
Total Bilirubin: 0.3 mg/dL (ref 0.2–1.2)
Total Protein: 7.4 g/dL (ref 6.0–8.3)

## 2019-09-20 LAB — VITAMIN D 25 HYDROXY (VIT D DEFICIENCY, FRACTURES): VITD: 22.8 ng/mL — ABNORMAL LOW (ref 30.00–100.00)

## 2019-09-20 NOTE — Progress Notes (Signed)
Patient: Tammy Hughes MRN: [id number] DOB: [birth year] PCP: Orma Flaming, MD     Subjective:  Chief Complaint  Patient presents with  . Hypertension  . Memory Loss  . Hyperlipidemia    HPI: The patient is a 86 y.o. female who presents today for hypertension and hyperlipidemia follow up. She has concerns of memory loss.   Hyperlipidemia: history of CAD and TIA and diabetes and on no statin. I started his on her 3 months ago. She states she is having no problems on this and no side effects. Takes daily as prescribed. I put her on crestor 5mg /day. We will check labs today.   Hypertension: Here for follow up of hypertension.  Currently on no medication. I stopped her beta blocker due to bradycardia, fatigue and dizziness.  Exercise includes walking. Weight has been stable. Denies any chest pain, headaches, shortness of breath, vision changes, swelling in lower extremities.   Memory loss: known issue for her. She feels like this was more noticeable to her about one year ago.  She lives at her home with her husband and is independent. She states she has noticed people will tell her things and she can't remember. She has no problems with date, birthday, orientation. She has never left stove on or gotten lost in the car or forgot where she is going. She can manage all of her medication just fine. She takes care of her house, independent with all ADLs, cooks and eats all of her meals. She has no issues with eating (remembering to) and takes remeron to maintain weight. She can not remember when they started her on this.   Has had her first covid shot and gets second shot later on this month.    Review of Systems  Constitutional: Negative for chills, fatigue and fever.  HENT: Negative for sinus pressure, sinus pain and sore throat.   Eyes: Negative for visual disturbance.  Respiratory: Negative for cough, choking and shortness of breath.   Cardiovascular: Negative for chest pain and  palpitations.  Gastrointestinal: Negative for diarrhea, nausea and vomiting.  Genitourinary: Negative for dysuria and flank pain.  Neurological: Negative for dizziness, facial asymmetry, speech difficulty, weakness, light-headedness and headaches.  Psychiatric/Behavioral: Positive for confusion (memory loss ). Negative for dysphoric mood and sleep disturbance. The patient is not nervous/anxious.     Allergies Patient is allergic to pirfenidone and aspirin.  Past Medical History Patient  has a past medical history of Allergy, ANXIETY (03/08/2008), Cataract, Depression, DIVERTICULOSIS, COLON (03/08/2008), DVT (deep venous thrombosis) ([place]), GERD (02/23/2007), HYPERCHOLESTEROLEMIA (02/01/2008), HYPERTENSION (03/08/2008), Insulin dependent diabetes mellitus, OSTEOARTHRITIS (02/23/2007), OSTEOPOROSIS (02/23/2007), PERIPHERAL NEUROPATHY (02/23/2007), STD (sexually transmitted disease), Stomach cancer ([place]) (07/20/14), Urinary incontinence, and Urolithiasis.  Surgical History Patient  has a past surgical history that includes Cholecystectomy (1995); Esophagogastroduodenoscopy (06/08/2002); Gastrectomy (N/A, 07/20/2014); laparoscopy (N/A, 07/20/2014); Gastrojejunostomy (N/A, 07/20/2014); Cardiac catheterization (N/A, 02/02/2016); and Abdominal surgery.  Family History Pateint's family history includes Cancer in her father, sister, and sister; Diabetes in her sister and sister; Other in her mother.  Social History Patient  reports that she has never smoked. She has never used smokeless tobacco. She reports that she does not drink alcohol or use drugs.    Objective: Vitals:   09/20/19 1032  BP: (!) 144/73  Pulse: (!) 57  Temp: (!) 97.3 F (36.3 C)  TempSrc: Temporal  SpO2: 99%  Weight: 126 lb 12.8 oz (57.5 kg)  Height: 5\' 1"  (1.549 m)    Body mass index is 23.96 kg/m.  Physical Exam Vitals reviewed.  Constitutional:      Appearance: Normal appearance. She is well-developed and normal weight.  HENT:      Head: Normocephalic and atraumatic.     Right Ear: Tympanic membrane, ear canal and external ear normal.     Left Ear: Tympanic membrane, ear canal and external ear normal.  Eyes:     Extraocular Movements: Extraocular movements intact.     Conjunctiva/sclera: Conjunctivae normal.     Pupils: Pupils are equal, round, and reactive to light.  Neck:     Thyroid: No thyromegaly.  Cardiovascular:     Rate and Rhythm: Normal rate and regular rhythm.     Heart sounds: Normal heart sounds. No murmur.  Pulmonary:     Effort: Pulmonary effort is normal.     Breath sounds: Normal breath sounds.  Abdominal:     General: Abdomen is flat. Bowel sounds are normal. There is no distension.     Palpations: Abdomen is soft.     Tenderness: There is no abdominal tenderness.  Musculoskeletal:     Cervical back: Normal range of motion and neck supple.  Lymphadenopathy:     Cervical: No cervical adenopathy.  Skin:    General: Skin is warm and dry.     Capillary Refill: Capillary refill takes less than 2 seconds.     Findings: No rash.  Neurological:     General: No focal deficit present.     Mental Status: She is alert and oriented to person, place, and time.     Cranial Nerves: No cranial nerve deficit.     Coordination: Coordination normal.     Deep Tendon Reflexes: Reflexes normal.  Psychiatric:        Mood and Affect: Mood normal.        Behavior: Behavior normal.         MMSE: 24/30.     Clinical Support from 06/17/2019 in [facility]  PHQ-2 Total Score  0       Assessment/plan: 1. Essential hypertension Blood pressure is to goal.  Routine lab work will be done today. Recommended routine exercise and healthy diet including DASH diet and mediterranean diet. Encouraged weight loss. F/u in 6 months. I took her off beta blocker due to fatigue, bradycardia and dizziness. bp has been controlled, continue to monitor.    2. Memory loss Mild dementia per mmse  score. Some of this could be scewed due to hearing loss. Referring to neurology as I don't want to start medication unless she really needs vs. Normal memory decline. Routine labs and checking culture as well. Will hold off on CT head.   3. HYPERCHOLESTEROLEMIA Tolerating well. Continue crestor. F/u on labs.  - Comprehensive metabolic panel - Lipid panel  4. Vitamin D deficiency Not on her routine, daily replacement. Will see where she is and may need to replete again.  - VITAMIN D 25 Hydroxy (Vit-D Deficiency, Fractures)  This visit occurred during the SARS-CoV-2 public health emergency.  Safety protocols were in place, including screening questions prior to the visit, additional usage of staff PPE, and extensive cleaning of exam room while observing appropriate contact time as indicated for disinfecting solutions.    Return in about 6 months (around 03/22/2020) for routine f/u .    Orma Flaming, MD [place]   09/20/2019

## 2019-09-21 LAB — URINE CULTURE
MICRO NUMBER:: 10226065
Result:: NO GROWTH
SPECIMEN QUALITY:: ADEQUATE

## 2019-09-23 ENCOUNTER — Other Ambulatory Visit: Payer: Self-pay | Admitting: Family Medicine

## 2019-09-23 DIAGNOSIS — E559 Vitamin D deficiency, unspecified: Secondary | ICD-10-CM

## 2019-09-23 DIAGNOSIS — R413 Other amnesia: Secondary | ICD-10-CM

## 2019-09-23 MED ORDER — VITAMIN D (ERGOCALCIFEROL) 1.25 MG (50000 UNIT) PO CAPS: ORAL_CAPSULE | ORAL | 0 refills | Status: DC

## 2019-09-29 ENCOUNTER — Other Ambulatory Visit: Payer: Self-pay | Admitting: Endocrinology

## 2019-09-29 DIAGNOSIS — Z794 Long term (current) use of insulin: Secondary | ICD-10-CM

## 2019-09-29 DIAGNOSIS — E119 Type 2 diabetes mellitus without complications: Secondary | ICD-10-CM

## 2019-09-30 ENCOUNTER — Encounter: Payer: Self-pay | Admitting: Neurology

## 2019-10-07 DIAGNOSIS — Z23 Encounter for immunization: Secondary | ICD-10-CM | POA: Diagnosis not present

## 2019-11-15 ENCOUNTER — Other Ambulatory Visit: Payer: Self-pay | Admitting: Endocrinology

## 2019-11-15 DIAGNOSIS — E119 Type 2 diabetes mellitus without complications: Secondary | ICD-10-CM

## 2019-11-15 DIAGNOSIS — Z794 Long term (current) use of insulin: Secondary | ICD-10-CM

## 2019-11-24 ENCOUNTER — Other Ambulatory Visit: Payer: Self-pay

## 2019-11-24 ENCOUNTER — Encounter: Payer: Self-pay | Admitting: Family Medicine

## 2019-11-24 ENCOUNTER — Ambulatory Visit (INDEPENDENT_AMBULATORY_CARE_PROVIDER_SITE_OTHER): Payer: Medicare Other | Admitting: Family Medicine

## 2019-11-24 VITALS — BP 152/72 | HR 70 | Temp 97.9°F | Ht 61.0 in | Wt 124.8 lb

## 2019-11-24 DIAGNOSIS — R109 Unspecified abdominal pain: Secondary | ICD-10-CM

## 2019-11-24 DIAGNOSIS — N3941 Urge incontinence: Secondary | ICD-10-CM

## 2019-11-24 LAB — POCT URINALYSIS DIPSTICK
Bilirubin, UA: NEGATIVE
Blood, UA: NEGATIVE
Glucose, UA: POSITIVE — AB
Ketones, UA: NEGATIVE
Leukocytes, UA: NEGATIVE
Nitrite, UA: NEGATIVE
Protein, UA: POSITIVE — AB
Spec Grav, UA: 1.025 (ref 1.010–1.025)
Urobilinogen, UA: 0.2 E.U./dL
pH, UA: 6 (ref 5.0–8.0)

## 2019-11-24 MED ORDER — MIRABEGRON ER 25 MG PO TB24: 25.0000 mg | ORAL_TABLET | Freq: Every day | ORAL | 1 refills | Status: DC

## 2019-11-24 NOTE — Progress Notes (Signed)
Patient: Tammy Hughes MRN: [id number] DOB: 07/06/1933 PCP: Orma Flaming, MD     Subjective:  Chief Complaint  Patient presents with  . Flank Pain    Right side; starting 1 week ago  . Urinary Frequency    Pt says that whe she wipes there is blood on the tissue.    HPI: The patient is a 86 y.o. female who presents today for Urinary frequency and right flank pain. She states she has increased urge to urinate and then starts to wet herself before she makes it to the bathroom. No dysuria, no visible blood in her urine. She has had no fever/chills. She states the pain in her back started about 1-2 weeks ago and is intermittent in nature. She states it doesn't come often and is more on the lateral side of her right back. She never has the pain during the day, only at night. Sometimes it will wrap around her entire back to the left side. She can not describe the pain in her back. No symptoms into her legs.   Review of Systems  Cardiovascular: Negative for chest pain and palpitations.  Gastrointestinal: Negative for nausea and vomiting.  Genitourinary: Positive for frequency and urgency. Negative for decreased urine volume, difficulty urinating, dysuria, flank pain and hematuria.  Neurological: Negative for dizziness, light-headedness and headaches.    Allergies Patient is allergic to pirfenidone and aspirin.  Past Medical History Patient  has a past medical history of Allergy, ANXIETY (03/08/2008), Cataract, Depression, DIVERTICULOSIS, COLON (03/08/2008), DVT (deep venous thrombosis) ([place]), GERD (02/23/2007), HYPERCHOLESTEROLEMIA (02/01/2008), HYPERTENSION (03/08/2008), Insulin dependent diabetes mellitus, OSTEOARTHRITIS (02/23/2007), OSTEOPOROSIS (02/23/2007), PERIPHERAL NEUROPATHY (02/23/2007), STD (sexually transmitted disease), Stomach cancer ([place]) (07/20/14), Urinary incontinence, and Urolithiasis.  Surgical History Patient  has a past surgical history that includes Cholecystectomy (1995);  Esophagogastroduodenoscopy (06/08/2002); Gastrectomy (N/A, 07/20/2014); laparoscopy (N/A, 07/20/2014); Gastrojejunostomy (N/A, 07/20/2014); Cardiac catheterization (N/A, 02/02/2016); and Abdominal surgery.  Family History Pateint's family history includes Cancer in her father, sister, and sister; Diabetes in her sister and sister; Other in her mother.  Social History Patient  reports that she has never smoked. She has never used smokeless tobacco. She reports that she does not drink alcohol or use drugs.    Objective: Vitals:   11/24/19 1424  BP: (!) 152/72  Pulse: 70  Temp: 97.9 F (36.6 C)  TempSrc: Temporal  SpO2: 98%  Weight: 124 lb 12.8 oz (56.6 kg)  Height: 5\' 1"  (1.549 m)    Body mass index is 23.58 kg/m.  Physical Exam Vitals reviewed.  Constitutional:      Appearance: Normal appearance. She is normal weight.  HENT:     Head: Normocephalic and atraumatic.  Cardiovascular:     Rate and Rhythm: Normal rate and regular rhythm.     Heart sounds: Normal heart sounds.  Pulmonary:     Effort: Pulmonary effort is normal.     Breath sounds: Normal breath sounds.  Abdominal:     General: Abdomen is flat. Bowel sounds are normal. There is no distension.     Palpations: Abdomen is soft.     Tenderness: There is no abdominal tenderness. There is no right CVA tenderness or left CVA tenderness.  Musculoskeletal:     Comments: TTP over right iliac crest, but none on exam. Just where it hurts at night.   Neurological:     Mental Status: She is alert.    UA: protein and glucose only     Assessment/plan: 1. Urge incontinence UA does  not like look infected. symptoms more consistent with urge incontinence and she has had frequency complaints since 07/2018.  She already has known prolapse likely contributing. Handout given with conservative therapy and will do trial of myrbetriq. If no improvement will send to uro-gyn. Medication discussed. Would like to see her back in 3 months time.    2. Flank pain She doesn't have flank pain. Pain is more on anterior iliac crest. No pain in her back. I wonder if it's how she sleeps since she sleeps on her side and only has pain at night. Discussed to try different positions of sleeping, make sure they don't need new mattress. If not getting better or change we can xray.  - POCT Urinalysis Dipstick - Urine Culture; Future - Urine Culture    This visit occurred during the SARS-CoV-2 public health emergency.  Safety protocols were in place, including screening questions prior to the visit, additional usage of staff PPE, and extensive cleaning of exam room while observing appropriate contact time as indicated for disinfecting solutions.     Return in about 3 months (around 02/24/2020).   Orma Flaming, MD [place]   11/24/2019

## 2019-11-24 NOTE — Patient Instructions (Addendum)
im starting you on a medication called myrbetriq. This should help the urinary issue. If you have an infection we can start medication, but your urine does not look infected.   Your back is only hurting at night, on the side. I wonder if it is how you are sleeping.   Urinary Incontinence  Urinary incontinence refers to a condition in which a person is unable to control where and when to pass urine. A person with this condition will urinate when he or she does not mean to (involuntarily). What are the causes? This condition may be caused by:  Medicines.  Infections.  Constipation.  Overactive bladder muscles.  Weak bladder muscles.  Weak pelvic floor muscles. These muscles provide support for the bladder, intestine, and, in women, the uterus.  Enlarged prostate in men. The prostate is a gland near the bladder. When it gets too big, it can pinch the urethra. With the urethra blocked, the bladder can weaken and lose the ability to empty properly.  Surgery.  Emotional factors, such as anxiety, stress, or post-traumatic stress disorder (PTSD).  Pelvic organ prolapse. This happens in women when organs shift out of place and into the vagina. This shift can prevent the bladder and urethra from working properly. What increases the risk? The following factors may make you more likely to develop this condition:  Older age.  Obesity and physical inactivity.  Pregnancy and childbirth.  Menopause.  Diseases that affect the nerves or spinal cord (neurological diseases).  Long-term (chronic) coughing. This can increase pressure on the bladder and pelvic floor muscles. What are the signs or symptoms? Symptoms may vary depending on the type of urinary incontinence you have. They include:  A sudden urge to urinate, but passing urine involuntarily before you can get to a bathroom (urge incontinence).  Suddenly passing urine with any activity that forces urine to pass, such as coughing,  laughing, exercise, or sneezing (stress incontinence).  Needing to urinate often, but urinating only a small amount, or constantly dribbling urine (overflow incontinence).  Urinating because you cannot get to the bathroom in time due to a physical disability, such as arthritis or injury, or communication and thinking problems, such as Alzheimer disease (functional incontinence). How is this diagnosed? This condition may be diagnosed based on:  Your medical history.  A physical exam.  Tests, such as: ? Urine tests. ? X-rays of your kidney and bladder. ? Ultrasound. ? CT scan. ? Cystoscopy. In this procedure, a health care provider inserts a tube with a light and camera (cystoscope) through the urethra and into the bladder in order to check for problems. ? Urodynamic testing. These tests assess how well the bladder, urethra, and sphincter can store and release urine. There are different types of urodynamic tests, and they vary depending on what the test is measuring. To help diagnose your condition, your health care provider may recommend that you keep a log of when you urinate and how much you urinate. How is this treated? Treatment for this condition depends on the type of incontinence that you have and its cause. Treatment may include:  Lifestyle changes, such as: ? Quitting smoking. ? Maintaining a healthy weight. ? Staying active. Try to get 150 minutes of moderate-intensity exercise every week. Ask your health care provider which activities are safe for you. ? Eating a healthy diet.  Avoid high-fat foods, like fried foods.  Avoid refined carbohydrates like white bread and white rice.  Limit how much alcohol and caffeine you  drink.  Increase your fiber intake. Foods such as fresh fruits, vegetables, beans, and whole grains are healthy sources of fiber.  Pelvic floor muscle exercises.  Bladder training, such as lengthening the amount of time between bathroom breaks, or using  the bathroom at regular intervals.  Using techniques to suppress bladder urges. This can include distraction techniques or controlled breathing exercises.  Medicines to relax the bladder muscles and prevent bladder spasms.  Medicines to help slow or prevent the growth of a man's prostate.  Botox injections. These can help relax the bladder muscles.  Using pulses of electricity to help change bladder reflexes (electrical nerve stimulation).  For women, using a medical device to prevent urine leaks. This is a small, tampon-like, disposable device that is inserted into the urethra.  Injecting collagen or carbon beads (bulking agents) into the urinary sphincter. These can help thicken tissue and close the bladder opening.  Surgery. Follow these instructions at home: Lifestyle  Limit alcohol and caffeine. These can fill your bladder quickly and irritate it.  Keep yourself clean to help prevent odors and skin damage. Ask your doctor about special skin creams and cleansers that can protect the skin from urine.  Consider wearing pads or adult diapers. Make sure to change them regularly, and always change them right after experiencing incontinence. General instructions  Take over-the-counter and prescription medicines only as told by your health care provider.  Use the bathroom about every 3-4 hours, even if you do not feel the need to urinate. Try to empty your bladder completely every time. After urinating, wait a minute. Then try to urinate again.  Make sure you are in a relaxed position while urinating.  If your incontinence is caused by nerve problems, keep a log of the medicines you take and the times you go to the bathroom.  Keep all follow-up visits as told by your health care provider. This is important. Contact a health care provider if:  You have pain that gets worse.  Your incontinence gets worse. Get help right away if:  You have a fever or chills.  You are unable to  urinate.  You have redness in your groin area or down your legs. Summary  Urinary incontinence refers to a condition in which a person is unable to control where and when to pass urine.  This condition may be caused by medicines, infection, weak bladder muscles, weak pelvic floor muscles, enlargement of the prostate (in men), or surgery.  The following factors increase your risk for developing this condition: older age, obesity, pregnancy and childbirth, menopause, neurological diseases, and chronic coughing.  There are several types of urinary incontinence. They include urge incontinence, stress incontinence, overflow incontinence, and functional incontinence.  This condition is usually treated first with lifestyle and behavioral changes, such as quitting smoking, eating a healthier diet, and doing regular pelvic floor exercises. Other treatment options include medicines, bulking agents, medical devices, electrical nerve stimulation, or surgery. This information is not intended to replace advice given to you by your health care provider. Make sure you discuss any questions you have with your health care provider. Document Revised: 07/11/2017 Document Reviewed: 10/10/2016 Elsevier Patient Education  [place].

## 2019-11-25 LAB — URINE CULTURE
MICRO NUMBER:: 10469386
Result:: NO GROWTH
SPECIMEN QUALITY:: ADEQUATE

## 2019-11-26 ENCOUNTER — Other Ambulatory Visit: Payer: Self-pay | Admitting: Family Medicine

## 2019-11-26 DIAGNOSIS — N8110 Cystocele, unspecified: Secondary | ICD-10-CM

## 2019-12-09 ENCOUNTER — Other Ambulatory Visit: Payer: Self-pay

## 2019-12-14 ENCOUNTER — Encounter: Payer: Self-pay | Admitting: Endocrinology

## 2019-12-14 ENCOUNTER — Other Ambulatory Visit: Payer: Self-pay

## 2019-12-14 ENCOUNTER — Ambulatory Visit (INDEPENDENT_AMBULATORY_CARE_PROVIDER_SITE_OTHER): Payer: BC Managed Care – PPO | Admitting: Endocrinology

## 2019-12-14 VITALS — BP 130/70 | HR 78 | Ht 61.0 in | Wt 124.0 lb

## 2019-12-14 DIAGNOSIS — Z794 Long term (current) use of insulin: Secondary | ICD-10-CM | POA: Diagnosis not present

## 2019-12-14 DIAGNOSIS — E119 Type 2 diabetes mellitus without complications: Secondary | ICD-10-CM | POA: Diagnosis not present

## 2019-12-14 LAB — POCT GLYCOSYLATED HEMOGLOBIN (HGB A1C): Hemoglobin A1C: 7.9 % — AB (ref 4.0–5.6)

## 2019-12-14 NOTE — Progress Notes (Signed)
Subjective:    Patient ID: Tammy Hughes, female    DOB: [birth year], 86 y.o.   MRN: [id number]  HPI Pt returns for f/u of diabetes mellitus:  DM type: Insulin-requiring type 2.  [id number].   Complications: PN and CAD.  Therapy: insulin since 2005.  GDM: never.   DKA: never.   Severe hypoglycemia: never.   Pancreatitis: never.  Other: she chose bid premixed insulin.   Interval history:  she brings her meter with her cbg's which I have reviewed today.  cbg varies from 148-506.  She has lost 2 lbs since last ov here.   Past Medical History:  Diagnosis Date   Allergy    SEASONAL   ANXIETY 03/08/2008   Cataract    REMOVED BILATERAL   Depression    DIVERTICULOSIS, COLON 03/08/2008   DVT (deep venous thrombosis) ([place])    GERD 02/23/2007   HYPERCHOLESTEROLEMIA 02/01/2008   HYPERTENSION 03/08/2008   Insulin dependent diabetes mellitus    OSTEOARTHRITIS 02/23/2007   OSTEOPOROSIS 02/23/2007   PERIPHERAL NEUROPATHY 02/23/2007   STD (sexually transmitted disease)    Stomach cancer ([place]) 07/20/14   invasive adenocarcinoma w/signet rings features   Urinary incontinence    Urolithiasis     Past Surgical History:  Procedure Laterality Date   ABDOMINAL SURGERY     CARDIAC CATHETERIZATION N/A 02/02/2016   Procedure: Left Heart Cath and Coronary Angiography;  Surgeon: Peter M Martinique, MD;  Location: [facility];  Service: Cardiovascular;  Laterality: N/A;   CHOLECYSTECTOMY  1995   ESOPHAGOGASTRODUODENOSCOPY  06/08/2002   GASTRECTOMY N/A 07/20/2014   Procedure: PROXIMAL GASTRECTOMY;  Surgeon: Stark Klein, MD;  Location: [facility];  Service: General;  Laterality: N/A;   GASTROJEJUNOSTOMY N/A 07/20/2014   Procedure: GASTROJEJUNOSTOMY;  Surgeon: Stark Klein, MD;  Location: [facility];  Service: General;  Laterality: N/A;   LAPAROSCOPY N/A 07/20/2014   Procedure: LAPAROSCOPY DIAGNOSTIC;  Surgeon: Stark Klein, MD;  Location: [facility];  Service: General;  Laterality: N/A;     Social History   Socioeconomic History   Marital status: Married    Spouse name: Not on file   Number of children: Not on file   Years of education: Not on file   Highest education level: Not on file  Occupational History   Occupation: Retired    Employer: RETIRED  Tobacco Use   Smoking status: Never Smoker   Smokeless tobacco: Never Used  Substance and Sexual Activity   Alcohol use: No    Alcohol/week: 0.0 standard drinks   Drug use: No   Sexual activity: Not Currently    Birth control/protection: Post-menopausal  Other Topics Concern   Not on file  Social History Narrative   Dentist      Social Determinants of Health   Financial Resource Strain:    Difficulty of Paying Living Expenses:   Food Insecurity:    Worried About Charity fundraiser in the Last Year:    Arboriculturist in the Last Year:   Transportation Needs:    Film/video editor (Medical):    Lack of Transportation (Non-Medical):   Physical Activity:    Days of Exercise per Week:    Minutes of Exercise per Session:   Stress:    Feeling of Stress :   Social Connections:    Frequency of Communication with Friends and Family:    Frequency of Social Gatherings with Friends and Family:    Attends Religious Services:  Active Member of Clubs or Organizations:    Attends Music therapist:    Marital Status:   Intimate Partner Violence:    Fear of Current or Ex-Partner:    Emotionally Abused:    Physically Abused:    Sexually Abused:     Current Outpatient Medications on File Prior to Visit  Medication Sig Dispense Refill   Acetaminophen (TYLENOL PO) Take 500 mg by mouth 2 (two) times daily.      calcium carbonate (TUMS - DOSED IN MG ELEMENTAL CALCIUM) 500 MG chewable tablet Chew 1 tablet by mouth daily.     cholecalciferol (VITAMIN D3) 25 MCG (1000 UT) tablet Take 1,000 Units by mouth daily.     clopidogrel (PLAVIX) 75 MG  tablet TAKE 1 TABLET BY MOUTH ONCE DAILY 90 tablet 1   diclofenac sodium (VOLTAREN) 1 % GEL Apply 4 g topically 4 (four) times daily. For knee pain 100 g 11   esomeprazole (NEXIUM) 40 MG capsule TAKE 1 CAPSULE BY MOUTH EACH DAY AT NOON 30 capsule 8   insulin aspart protamine - aspart (NOVOLOG MIX 70/30 FLEXPEN) (70-30) 100 UNIT/ML FlexPen Inject 0.26 mLs (26 Units total) into the skin daily with breakfast. and pen needles 2/day 10 pen 3   meclizine (ANTIVERT) 25 MG tablet TAKE 1 TABLET BY MOUTH THREE TIMES DAILY IF NEEDED FOR DIZZINESS 30 tablet 0   mirabegron ER (MYRBETRIQ) 25 MG TB24 tablet Take 1 tablet (25 mg total) by mouth daily. 30 tablet 1   mirtazapine (REMERON) 15 MG tablet TAKE 1 TABLET BY MOUTH EACH NIGHT AT BEDTIME AS DIRECTED 90 tablet 3   ONETOUCH VERIO test strip USE 1 STRIP TO CHECK GLUCOSE TWICE DAILY 100 each 0   rosuvastatin (CRESTOR) 5 MG tablet Take 1 tablet (5 mg total) by mouth daily. 90 tablet 3   Vitamin D, Ergocalciferol, (DRISDOL) 1.25 MG (50000 UNIT) CAPS capsule One capsule by mouth once a week for 12 weeks. Then take 2000IU/day 12 capsule 0   No current facility-administered medications on file prior to visit.    Allergies  Allergen Reactions   Pirfenidone Diarrhea and Nausea And Vomiting   Aspirin     rash    Family History  Problem Relation Age of Onset   Cancer Father        Prostate Cancer   Cancer Sister        Breast Cancer   Diabetes Sister    Diabetes Sister    Other Mother        pneumonia    Cancer Sister        breast cancer     BP 130/70    Pulse 78    Ht 5\' 1"  (1.549 m)    Wt 124 lb (56.2 kg)    SpO2 94%    BMI 23.43 kg/m    Review of Systems She denies hypoglycemia.      Objective:   Physical Exam VITAL SIGNS:  See vs page GENERAL: no distress Pulses: dorsalis pedis intact bilat.   MSK: no deformity of the feet CV: trace bilat leg edema, and bilat vv's Skin:  no ulcer on the feet.  normal color and temp on  the feet. Neuro: sensation is intact to touch on the feet  Lab Results  Component Value Date   CREATININE 0.95 09/20/2019   BUN 15 09/20/2019   NA 140 09/20/2019   K 4.2 09/20/2019   CL 105 09/20/2019   CO2 30 09/20/2019  Assessment & Plan:  Insulin-requiring type 2 DM, with CAD: she declines to increase rx.    Patient Instructions  Please continue the same insulin. On this type of insulin schedule, you should eat meals on a regular schedule.  If a meal is missed or significantly delayed, your blood sugar could go low. check your blood sugar twice a day.  vary the time of day when you check, between before the 3 meals, and at bedtime.  also check if you have symptoms of your blood sugar being too high or too low.  please keep a record of the readings and bring it to your next appointment here (or you can bring the meter itself).  You can write it on any piece of paper.  please call us sooner if your blood sugar goes below 70, or if you have a lot of readings over 200. Please come back for a follow-up appointment in 3-4 months.

## 2019-12-14 NOTE — Patient Instructions (Addendum)
Please continue the same insulin. On this type of insulin schedule, you should eat meals on a regular schedule.  If a meal is missed or significantly delayed, your blood sugar could go low. check your blood sugar twice a day.  vary the time of day when you check, between before the 3 meals, and at bedtime.  also check if you have symptoms of your blood sugar being too high or too low.  please keep a record of the readings and bring it to your next appointment here (or you can bring the meter itself).  You can write it on any piece of paper.  please call us sooner if your blood sugar goes below 70, or if you have a lot of readings over 200. Please come back for a follow-up appointment in 3-4 months.

## 2019-12-23 ENCOUNTER — Telehealth: Payer: Self-pay

## 2019-12-23 ENCOUNTER — Other Ambulatory Visit: Payer: Self-pay | Admitting: Internal Medicine

## 2019-12-23 DIAGNOSIS — E119 Type 2 diabetes mellitus without complications: Secondary | ICD-10-CM

## 2019-12-23 MED ORDER — NOVOLOG MIX 70/30 FLEXPEN (70-30) 100 UNIT/ML ~~LOC~~ SUPN: 12.0000 [IU] | PEN_INJECTOR | Freq: Two times a day (BID) | SUBCUTANEOUS | 3 refills | Status: DC

## 2019-12-23 NOTE — Telephone Encounter (Signed)
1. Are you taking any type of steroids? No  2. Are you sick or becoming sick? No  3. Is this the first elevated blood sugar reading? No  4. Have you tried to correct it with insulin? No  5. Have you been taking/taken today all of the prescribed medications? Yes  6. What is your current diabetic insulin or oral medication dosage? Novolog 70/30 26 units qam   Date Time Reading Notes       TODAY 10am 600 Took insulin as prescribed  TODAY 1015am 307   6/9 930am 211   6/9 930pm 222   6/8 930am 233   6/8 10pm 297   6/7 915am 227   6/7 10pm 275   6/6 845am 227   6/6 930pm 275   6/5 10am 232   6/5 945pm 219   6/4 9am 284   6/4 945pm 219   6/3 9am 195   6/3 930pm 204     LOV 12/14/19. Follow up in 3-4 months. No changes were made to medication regimen at that visit. Please advise

## 2019-12-23 NOTE — Telephone Encounter (Signed)
Attempted to return pt call to provide new orders from Dr. Kelton Pillar. However, VM not set up. Will continue efforts to reach pt

## 2019-12-23 NOTE — Telephone Encounter (Signed)
Pt returned call. Informed about new orders. Verbalized acceptance and understanding. Orders have been addended by Dr. Kelton Pillar to reflect new dosage. Pt denies need for refills at this time.

## 2019-12-28 ENCOUNTER — Ambulatory Visit (INDEPENDENT_AMBULATORY_CARE_PROVIDER_SITE_OTHER): Payer: Medicare Other | Admitting: Neurology

## 2019-12-28 ENCOUNTER — Encounter: Payer: Self-pay | Admitting: Neurology

## 2019-12-28 ENCOUNTER — Other Ambulatory Visit: Payer: Self-pay

## 2019-12-28 VITALS — BP 146/74 | HR 60 | Ht 61.0 in | Wt 124.8 lb

## 2019-12-28 DIAGNOSIS — Z85028 Personal history of other malignant neoplasm of stomach: Secondary | ICD-10-CM | POA: Diagnosis not present

## 2019-12-28 DIAGNOSIS — F03A0 Unspecified dementia, mild, without behavioral disturbance, psychotic disturbance, mood disturbance, and anxiety: Secondary | ICD-10-CM

## 2019-12-28 DIAGNOSIS — F0390 Unspecified dementia without behavioral disturbance: Secondary | ICD-10-CM | POA: Diagnosis not present

## 2019-12-28 MED ORDER — DONEPEZIL HCL 10 MG PO TABS
ORAL_TABLET | ORAL | 11 refills | Status: DC
Start: 2019-12-28 — End: 2020-06-21

## 2019-12-28 NOTE — Progress Notes (Signed)
NEUROLOGY CONSULTATION NOTE  [name] MRN: [id number] DOB: 15-Jun-1933  Referring provider: Dr. Orma Flaming Primary care provider: Dr. Orma Flaming  Reason for consult:  Memory loss  Dear Dr Rogers Blocker:  Thank you for your kind referral of Tammy Hughes for consultation of the above symptoms. Although her history is well known to you, please allow me to reiterate it for the purpose of our medical record. The patient was accompanied to the clinic by [name] Hughes who also provides collateral information. Records and images were personally reviewed where available.   HISTORY OF PRESENT ILLNESS: This is an 86 year old right-handed woman with a history of hypertension, hyperlipidemia, diabetes, stomach cancer, anxiety, depression, presenting for evaluation of memory loss. [name] Hughes is present to provide additional information. She states "I just can't remember." Tammy Hughes started noticing changes around 4 years ago after she underwent chemotherapy and radiation treatment, worse in the past year. She repeats herself several times. She lives with her husband. She was diagnosed with cancer 4 years ago and was forgetting bill payments, her husband took over at that time. She denies getting lost driving. She forgets her medications, they had to count her pills and find that she has not taken them as instructed. She denies leaving the stove on but Tammy Hughes reports she has burned food, thinking she turned the burner off. She denies misplacing things. She is independent with dressing and bathing. Her 2 sisters and brother had Alzheimer's disease. No concussions or alcohol use. MMSE 24/30 in PCP office last 09/2019.  She has infrequent headaches, occasional dizziness/vertigo, low back pain, urinary incontinence. She denies any diplopia, dysarthria/dysphagia, focal numbness/tingling/weakness, tremors, or anosmia. No falls. Sleep is not good sometimes, she occasionally takes naps. No wandering  behavior. Her daughter notes she is a little more irritable than before, she does not like a lot of people around. No hallucinations or paranoia.   Laboratory Data: Lab Results  Component Value Date   TSH 0.66 04/21/2019   Lab Results  Component Value Date   VITAMINB12 1,317 (H) 07/05/2019     PAST MEDICAL HISTORY: Past Medical History:  Diagnosis Date  . Allergy    SEASONAL  . ANXIETY 03/08/2008  . Cataract    REMOVED BILATERAL  . Depression   . DIVERTICULOSIS, COLON 03/08/2008  . DVT (deep venous thrombosis) ([place])   . GERD 02/23/2007  . HYPERCHOLESTEROLEMIA 02/01/2008  . HYPERTENSION 03/08/2008  . Insulin dependent diabetes mellitus   . OSTEOARTHRITIS 02/23/2007  . OSTEOPOROSIS 02/23/2007  . PERIPHERAL NEUROPATHY 02/23/2007  . STD (sexually transmitted disease)   . Stomach cancer ([place] [place]) 07/20/14   invasive adenocarcinoma w/signet rings features  . Urinary incontinence   . Urolithiasis     PAST SURGICAL HISTORY: Past Surgical History:  Procedure Laterality Date  . ABDOMINAL SURGERY    . CARDIAC CATHETERIZATION N/A 02/02/2016   Procedure: Left Heart Cath and Coronary Angiography;  Surgeon: Peter M Martinique, MD;  Location: [facility];  Service: Cardiovascular;  Laterality: N/A;  . CHOLECYSTECTOMY  1995  . ESOPHAGOGASTRODUODENOSCOPY  06/08/2002  . GASTRECTOMY N/A 07/20/2014   Procedure: PROXIMAL GASTRECTOMY;  Surgeon: Stark Klein, MD;  Location: [place];  Service: General;  Laterality: N/A;  . GASTROJEJUNOSTOMY N/A 07/20/2014   Procedure: GASTROJEJUNOSTOMY;  Surgeon: Stark Klein, MD;  Location: [place];  Service: General;  Laterality: N/A;  . LAPAROSCOPY N/A 07/20/2014   Procedure: LAPAROSCOPY DIAGNOSTIC;  Surgeon: Stark Klein, MD;  Location: [facility];  Service:  General;  Laterality: N/A;    MEDICATIONS: Current Outpatient Medications on File Prior to Visit  Medication Sig Dispense Refill  . Acetaminophen (TYLENOL PO) Take 500 mg by mouth 2 (two) times daily.     . calcium  carbonate (TUMS - DOSED IN MG ELEMENTAL CALCIUM) 500 MG chewable tablet Chew 1 tablet by mouth daily.    . cholecalciferol (VITAMIN D3) 25 MCG (1000 UT) tablet Take 1,000 Units by mouth daily.    . clopidogrel (PLAVIX) 75 MG tablet TAKE 1 TABLET BY MOUTH ONCE DAILY 90 tablet 1  . diclofenac sodium (VOLTAREN) 1 % GEL Apply 4 g topically 4 (four) times daily. For knee pain 100 g 11  . esomeprazole (NEXIUM) 40 MG capsule TAKE 1 CAPSULE BY MOUTH EACH DAY AT NOON 30 capsule 8  . insulin aspart protamine - aspart (NOVOLOG MIX 70/30 FLEXPEN) (70-30) 100 UNIT/ML FlexPen Inject 0.12 mLs (12 Units total) into the skin 2 (two) times daily with a meal. and pen needles 2/day 10 pen 3  . meclizine (ANTIVERT) 25 MG tablet TAKE 1 TABLET BY MOUTH THREE TIMES DAILY IF NEEDED FOR DIZZINESS 30 tablet 0  . mirabegron ER (MYRBETRIQ) 25 MG TB24 tablet Take 1 tablet (25 mg total) by mouth daily. 30 tablet 1  . mirtazapine (REMERON) 15 MG tablet TAKE 1 TABLET BY MOUTH EACH NIGHT AT BEDTIME AS DIRECTED 90 tablet 3  . ONETOUCH VERIO test strip USE 1 STRIP TO CHECK GLUCOSE TWICE DAILY 100 each 0  . rosuvastatin (CRESTOR) 5 MG tablet Take 1 tablet (5 mg total) by mouth daily. 90 tablet 3  . Vitamin D, Ergocalciferol, (DRISDOL) 1.25 MG (50000 UNIT) CAPS capsule One capsule by mouth once a week for 12 weeks. Then take 2000IU/day 12 capsule 0   No current facility-administered medications on file prior to visit.    ALLERGIES: Allergies  Allergen Reactions  . Pirfenidone Diarrhea and Nausea And Vomiting  . Aspirin     rash    FAMILY HISTORY: Family History  Problem Relation Age of Onset  . Cancer Father        Prostate Cancer  . Cancer Sister        Breast Cancer  . Diabetes Sister   . Diabetes Sister   . Other Mother        pneumonia   . Cancer Sister        breast cancer     SOCIAL HISTORY: Social History   Socioeconomic History  . Marital status: Married    Spouse name: Not on file  . Number of  children: Not on file  . Years of education: Not on file  . Highest education level: Not on file  Occupational History  . Occupation: Retired    Fish farm manager: RETIRED  Tobacco Use  . Smoking status: Never Smoker  . Smokeless tobacco: Never Used  Vaping Use  . Vaping Use: Never used  Substance and Sexual Activity  . Alcohol use: No    Alcohol/week: 0.0 standard drinks  . Drug use: No  . Sexual activity: Not Currently    Birth control/protection: Post-menopausal  Other Topics Concern  . Not on file  Social History Narrative   Dentist      Social Determinants of Health   Financial Resource Strain:   . Difficulty of Paying Living Expenses:   Food Insecurity:   . Worried About Charity fundraiser in the Last Year:   . Gamaliel in the Last  Year:   Transportation Needs:   . Film/video editor (Medical):   Marland Kitchen Lack of Transportation (Non-Medical):   Physical Activity:   . Days of Exercise per Week:   . Minutes of Exercise per Session:   Stress:   . Feeling of Stress :   Social Connections:   . Frequency of Communication with Friends and Family:   . Frequency of Social Gatherings with Friends and Family:   . Attends Religious Services:   . Active Member of Clubs or Organizations:   . Attends Archivist Meetings:   Marland Kitchen Marital Status:   Intimate Partner Violence:   . Fear of Current or Ex-Partner:   . Emotionally Abused:   Marland Kitchen Physically Abused:   . Sexually Abused:     PHYSICAL EXAM: Vitals:   12/28/19 1304  BP: (!) 146/74  Pulse: 60  SpO2: 96%   General: No acute distress Head:  Normocephalic/atraumatic Skin/Extremities: No rash, no edema Neurological Exam: Mental status: alert and oriented to person, place, and time, no dysarthria or aphasia, Fund of knowledge is reduced.  Recent and remote memory are impaired. Attention and concentration are reduced.  SLUMS score 10/30 [employer] Mental Exam 12/28/2019  Weekday Correct 1   Current year 1  What state are we in? 1  Amount spent 1  Amount left 0  # of Animals 1  5 objects recall 1  Number series 0  Hour markers 0  Time correct 0  Placed X in triangle correctly 1  Largest Figure 1  Name of female 2  Date back to work 0  Type of work 0  State she lived in 0  Total score 10   Cranial nerves: CN I: not tested CN II: pupils equal, round and reactive to light, visual fields intact CN III, IV, VI:  full range of motion, no nystagmus, no ptosis CN V: facial sensation intact CN VII: upper and lower face symmetric CN VIII: hearing intact to conversation Bulk & Tone: normal, no fasciculations. Motor: 5/5 throughout with no pronator drift. Sensation: intact to light touch, cold, pin, vibration and joint position sense.  No extinction to double simultaneous stimulation.  Romberg test negative Deep Tendon Reflexes: +1 throughout, no ankle clonus Cerebellar: no incoordination on finger to nose testing Gait: slow and cautious, no ataxia Tremor: none  IMPRESSION: This is an 86 year old right-handed woman with a history of hypertension, hyperlipidemia, diabetes, stomach cancer, anxiety, depression, presenting for evaluation of memory loss. Her neurological exam is non-focal, SLUMS score 10/30. Memory changes started after chemotherapy/radiation 4 years ago, progressively worsening over the past year. She is having difficulties managing complex tasks, indicating mild dementia, etiology possibly Alzheimer's disease. MRI brain with and without contrast will be ordered to assess for underlying structural abnormality. We discussed starting Donepezil, including side effects and expectations. Start Donepezil 74m 1/2 tab daily for 2 weeks, then increase to 1 tablet daily. She was advised to start using a pillbox and have family monitor medications. Monitor driving as well. We discussed the importance of control of vascular risk factors, physical exercise, and brain stimulation  exercises for brain health. Follow-up in 6 months, they know to call for any changes.   Thank you for allowing me to participate in the care of this patient. Please do not hesitate to call for any questions or concerns.   KEllouise Newer M.D.  CC: Dr. WRogers Blocker

## 2019-12-28 NOTE — Progress Notes (Signed)
[id number] VALID UNTIL 12/11

## 2019-12-28 NOTE — Patient Instructions (Signed)
1. Schedule open MRI brain with and without contrast  2. Start Donepezil 10mg : Take 1/2 tablet daily for 2 weeks, then increase to 1 tablet daily  3. Start using a pillbox and having family help with medications  4. Continue to monitor driving  5. Follow-up in 6 months, call for any changes  FALL PRECAUTIONS: Be cautious when walking. Scan the area for obstacles that may increase the risk of trips and falls. When getting up in the mornings, sit up at the edge of the bed for a few minutes before getting out of bed. Consider elevating the bed at the head end to avoid drop of blood pressure when getting up. Walk always in a well-lit room (use night lights in the walls). Avoid area rugs or power cords from appliances in the middle of the walkways. Use a walker or a cane if necessary and consider physical therapy for balance exercise. Get your eyesight checked regularly.  FINANCIAL OVERSIGHT: Supervision, especially oversight when making financial decisions or transactions is also recommended.  HOME SAFETY: Consider the safety of the kitchen when operating appliances like stoves, microwave oven, and blender. Consider having supervision and share cooking responsibilities until no longer able to participate in those. Accidents with firearms and other hazards in the house should be identified and addressed as well.  DRIVING: Regarding driving, in patients with progressive memory problems, driving will be impaired. We advise to have someone else do the driving if trouble finding directions or if minor accidents are reported. Independent driving assessment is available to determine safety of driving.  ABILITY TO BE LEFT ALONE: If patient is unable to contact 911 operator, consider using LifeLine, or when the need is there, arrange for someone to stay with patients. Smoking is a fire hazard, consider supervision or cessation. Risk of wandering should be assessed by caregiver and if detected at any point,  supervision and safe proof recommendations should be instituted.  MEDICATION SUPERVISION: Inability to self-administer medication needs to be constantly addressed. Implement a mechanism to ensure safe administration of the medications.  RECOMMENDATIONS FOR ALL PATIENTS WITH MEMORY PROBLEMS: 1. Continue to exercise (Recommend 30 minutes of walking everyday, or 3 hours every week) 2. Increase social interactions - continue going to [place] and enjoy social gatherings with friends and family 3. Eat healthy, avoid fried foods and eat more fruits and vegetables 4. Maintain adequate blood pressure, blood sugar, and blood cholesterol level. Reducing the risk of stroke and cardiovascular disease also helps promoting better memory. 5. Avoid stressful situations. Live a simple life and avoid aggravations. Organize your time and prepare for the next day in anticipation. 6. Sleep well, avoid any interruptions of sleep and avoid any distractions in the bedroom that may interfere with adequate sleep quality 7. Avoid sugar, avoid sweets as there is a strong link between excessive sugar intake, diabetes, and cognitive impairment We discussed the Mediterranean diet, which has been shown to help patients reduce the risk of progressive memory disorders and reduces cardiovascular risk. This includes eating fish, eat fruits and green leafy vegetables, nuts like almonds and hazelnuts, walnuts, and also use olive oil. Avoid fast foods and fried foods as much as possible. Avoid sweets and sugar as sugar use has been linked to worsening of memory function.  There is always a concern of gradual progression of memory problems. If this is the case, then we may need to adjust level of care according to patient needs. Support, both to the patient and caregiver, should then  be put into place.

## 2020-01-07 ENCOUNTER — Ambulatory Visit: Payer: Medicare Other | Admitting: Neurology

## 2020-01-07 ENCOUNTER — Other Ambulatory Visit: Payer: Self-pay | Admitting: Family Medicine

## 2020-01-10 ENCOUNTER — Encounter: Payer: Self-pay | Admitting: Family Medicine

## 2020-01-10 DIAGNOSIS — N814 Uterovaginal prolapse, unspecified: Secondary | ICD-10-CM | POA: Insufficient documentation

## 2020-01-12 ENCOUNTER — Telehealth: Payer: Self-pay

## 2020-01-12 NOTE — Telephone Encounter (Signed)
-----   Message from Cameron Sprang, MD sent at 01/12/2020  4:18 PM EDT ----- Regarding: MRI results Pls let daughter know the MRI Brain did not show any evidence of tumor, stroke, or bleed. It showed age-related changes. Thanks  Santiago Glad

## 2020-01-12 NOTE — Telephone Encounter (Signed)
Telephone call to Pt daughter MRI results given.

## 2020-01-17 DIAGNOSIS — R519 Headache, unspecified: Secondary | ICD-10-CM | POA: Diagnosis not present

## 2020-01-18 ENCOUNTER — Other Ambulatory Visit: Payer: Self-pay | Admitting: Endocrinology

## 2020-01-18 DIAGNOSIS — Z794 Long term (current) use of insulin: Secondary | ICD-10-CM

## 2020-01-19 ENCOUNTER — Encounter: Payer: Self-pay | Admitting: Family Medicine

## 2020-01-19 ENCOUNTER — Telehealth: Payer: BC Managed Care – PPO | Admitting: Family Medicine

## 2020-01-19 ENCOUNTER — Telehealth (INDEPENDENT_AMBULATORY_CARE_PROVIDER_SITE_OTHER): Payer: BC Managed Care – PPO | Admitting: Family Medicine

## 2020-01-19 ENCOUNTER — Other Ambulatory Visit: Payer: Self-pay

## 2020-01-19 VITALS — Ht 61.0 in | Wt 124.0 lb

## 2020-01-19 DIAGNOSIS — R519 Headache, unspecified: Secondary | ICD-10-CM

## 2020-01-19 NOTE — Progress Notes (Signed)
Patient: Tammy Hughes MRN: [id number] DOB: 06/04/1933 PCP: Orma Flaming, MD     I connected with Dorris Fetch on 01/19/20 at 11:42am by a video enabled telemedicine application and verified that I am speaking with the correct person using two identifiers.  Location patient: Home Location provider: [facility], Office Persons participating in this virtual visit: Matisha Termine, Dr. Rogers Blocker and her daughter, Rip Harbour.   I discussed the limitations of evaluation and management by telemedicine and the availability of in person appointments. The patient expressed understanding and agreed to proceed.   Subjective:  Chief Complaint  Patient presents with  . Headache  . Sinus Problem    HPI: The patient is a 86 y.o. female who presents today for Headache.  Her daughter says that she has been having a headache for awhile.   She was seen at local hospital for sinusitis and given nose spray. She has no fever/chills, no sinus pain or pressure, no congestion, no mucous, no teeth pain.   Headache started about 3 days ago. She doesn't really feel like she has congested. She has had no fever/chills. She has had some blurry vision, but this was going on before her headache and pressure started. No drainage from the eye. She denies any sick contacts. Her daughter states she has a lot of headaches. She will take tylenol for the headache. This does help the headache. Headache is rated a 5/10 and described as pressure. She has a hard time describing them. No associated focal deficits. No nausea or vomiting associated with this. She has some phonophobia, but no photophobia. Headaches last for 10-15 minutes. Her daughter states she says this all of the time, but isn't sure if she is confused with her memory loss.  She doesn't have a headache daily. She has no headache now. Seems to be brought on by movement and moving her head.   Review of Systems  Constitutional: Negative for chills, fatigue and fever.   HENT: Negative for sinus pressure and sinus pain.   Neurological: Negative for weakness, light-headedness and headaches.    Allergies Patient is allergic to pirfenidone and aspirin.  Past Medical History Patient  has a past medical history of Allergy, ANXIETY (03/08/2008), Cataract, Depression, DIVERTICULOSIS, COLON (03/08/2008), DVT (deep venous thrombosis) ([place]), GERD (02/23/2007), HYPERCHOLESTEROLEMIA (02/01/2008), HYPERTENSION (03/08/2008), Insulin dependent diabetes mellitus, OSTEOARTHRITIS (02/23/2007), OSTEOPOROSIS (02/23/2007), PERIPHERAL NEUROPATHY (02/23/2007), STD (sexually transmitted disease), Stomach cancer ([facility]) (07/20/14), Urinary incontinence, and Urolithiasis.  Surgical History Patient  has a past surgical history that includes Cholecystectomy (1995); Esophagogastroduodenoscopy (06/08/2002); Gastrectomy (N/A, 07/20/2014); laparoscopy (N/A, 07/20/2014); Gastrojejunostomy (N/A, 07/20/2014); Cardiac catheterization (N/A, 02/02/2016); and Abdominal surgery.  Family History Pateint's family history includes Cancer in her father, sister, and sister; Diabetes in her sister and sister; Other in her mother.  Social History Patient  reports that she has never smoked. She has never used smokeless tobacco. She reports that she does not drink alcohol and does not use drugs.    Objective: Vitals:   01/19/20 1121  Weight: 124 lb (56.2 kg)  Height: 5\' 1"  (1.549 m)    Body mass index is 23.43 kg/m.  Physical Exam Vitals reviewed.  Constitutional:      Appearance: She is well-developed and normal weight.  HENT:     Head: Normocephalic and atraumatic.  Pulmonary:     Effort: Pulmonary effort is normal.  Neurological:     Mental Status: She is alert.     Cranial Nerves: No cranial nerve deficit, dysarthria or facial  asymmetry.  Psychiatric:        Mood and Affect: Mood normal.        Speech: Speech normal.        Behavior: Behavior normal.        Assessment/plan: 1. Headache above the  eye region History is difficult to obtain with her memory loss and headaches seem very mild and only last 10-15 minutes. Recent MRI of brain done with neurology. Already on remeron. Daughter wonders if she really is having a headache. I wonder if this is more MSk since it's worse with head movement. Regardless, we are going to do watchful waiting and see if she can keep a headache log for me.  Daughter will also keep a close eye on her. They will f/u if worsening symptoms before regularly scheduled appointment.       Return if symptoms worsen or fail to improve.     Orma Flaming, MD [place]  01/19/2020

## 2020-01-27 ENCOUNTER — Other Ambulatory Visit: Payer: Self-pay

## 2020-01-27 ENCOUNTER — Encounter: Payer: Self-pay | Admitting: Family Medicine

## 2020-01-27 ENCOUNTER — Ambulatory Visit (INDEPENDENT_AMBULATORY_CARE_PROVIDER_SITE_OTHER): Payer: BC Managed Care – PPO | Admitting: Family Medicine

## 2020-01-27 VITALS — BP 140/60 | HR 65 | Temp 97.9°F | Ht 61.0 in | Wt 123.0 lb

## 2020-01-27 DIAGNOSIS — I10 Essential (primary) hypertension: Secondary | ICD-10-CM

## 2020-01-27 NOTE — Progress Notes (Signed)
Patient: Tammy Hughes MRN: [id number] DOB: 10-17-1932 PCP: Orma Flaming, MD     Subjective:  Chief Complaint  Patient presents with  . Hypertension    HPI: The patient is a 86 y.o. female who presents today for hypertension. Pt says that she went to GYN and says that she was sent here because of recent elevated readings. Her gyn apparently does not have her medical history that she has HTN. Her readings yesterday were 142/78. She has not taken any readings at home. Average readings are 130-140s/70-80s. She is on no medication for HTn and feels good. Asymptomatic.    Review of Systems  Eyes: Negative for visual disturbance.  Respiratory: Negative for cough, shortness of breath and wheezing.   Cardiovascular: Negative for chest pain, palpitations and leg swelling.  Genitourinary: Negative for frequency.  Neurological: Positive for light-headedness and headaches. Negative for dizziness.    Allergies Patient is allergic to pirfenidone and aspirin.  Past Medical History Patient  has a past medical history of Allergy, ANXIETY (03/08/2008), Cataract, Depression, DIVERTICULOSIS, COLON (03/08/2008), DVT (deep venous thrombosis) ([place]), GERD (02/23/2007), HYPERCHOLESTEROLEMIA (02/01/2008), HYPERTENSION (03/08/2008), Insulin dependent diabetes mellitus, OSTEOARTHRITIS (02/23/2007), OSTEOPOROSIS (02/23/2007), PERIPHERAL NEUROPATHY (02/23/2007), STD (sexually transmitted disease), Stomach cancer ([place]) (07/20/14), Urinary incontinence, and Urolithiasis.  Surgical History Patient  has a past surgical history that includes Cholecystectomy (1995); Esophagogastroduodenoscopy (06/08/2002); Gastrectomy (N/A, 07/20/2014); laparoscopy (N/A, 07/20/2014); Gastrojejunostomy (N/A, 07/20/2014); Cardiac catheterization (N/A, 02/02/2016); and Abdominal surgery.  Family History Pateint's family history includes Cancer in her father, sister, and sister; Diabetes in her sister and sister; Other in her mother.  Social  History Patient  reports that she has never smoked. She has never used smokeless tobacco. She reports that she does not drink alcohol and does not use drugs.    Objective: Vitals:   01/27/20 1114 01/27/20 1146  BP: 140/80 140/60  Pulse: 65   Temp: 97.9 F (36.6 C)   TempSrc: Temporal   SpO2: 97%   Weight: 123 lb (55.8 kg)   Height: 5\' 1"  (1.549 m)     Body mass index is 23.24 kg/m.  Physical Exam Vitals reviewed.  Constitutional:      Appearance: Normal appearance. She is normal weight.  HENT:     Head: Normocephalic and atraumatic.  Cardiovascular:     Rate and Rhythm: Normal rate and regular rhythm.     Heart sounds: Normal heart sounds.  Pulmonary:     Effort: Pulmonary effort is normal.     Breath sounds: Normal breath sounds.  Abdominal:     General: Abdomen is flat. Bowel sounds are normal.     Palpations: Abdomen is soft.  Neurological:     General: No focal deficit present.     Mental Status: She is alert and oriented to person, place, and time.  Psychiatric:        Mood and Affect: Mood normal.        Behavior: Behavior normal.        Assessment/plan: 1. Essential hypertension Has been in the 130-140/60-80, to goal for her age. I really do not want to add on to her at age 48. I'll have her keep a log for me at home and follow up with me at her regular follow up in 1.5 months or sooner if blood pressures are higher at home.   All instructions written down since she has memory loss.    This visit occurred during the SARS-CoV-2 public health emergency.  Safety protocols were in place,  including screening questions prior to the visit, additional usage of staff PPE, and extensive cleaning of exam room while observing appropriate contact time as indicated for disinfecting solutions.     Return if symptoms worsen or fail to improve.    Orma Flaming, MD [place]   01/27/2020

## 2020-01-27 NOTE — Patient Instructions (Addendum)
Since you have the pessary, lot's stop the myrbetriq (bladder pill) and see how you do!   -your blood pressure is good here in the office and has been in the past as well as at your other doctor's visit. I want you to keep a log at home for me or have your daughter check it when she is there with you. If you are having blood pressure readings in the 150/90 or greater, please let me know sooner than your appointment with me. im happy with what you are here today and dont' want to start medication and possibly lower your pressures too much.   -let me know if any issue!   Dr. Rogers Blocker

## 2020-02-07 LAB — HM DIABETES EYE EXAM

## 2020-02-10 ENCOUNTER — Telehealth (INDEPENDENT_AMBULATORY_CARE_PROVIDER_SITE_OTHER): Payer: Medicare Other | Admitting: Family Medicine

## 2020-02-10 ENCOUNTER — Other Ambulatory Visit: Payer: Self-pay

## 2020-02-10 DIAGNOSIS — R0981 Nasal congestion: Secondary | ICD-10-CM | POA: Diagnosis not present

## 2020-02-10 DIAGNOSIS — R059 Cough, unspecified: Secondary | ICD-10-CM

## 2020-02-10 DIAGNOSIS — R05 Cough: Secondary | ICD-10-CM | POA: Diagnosis not present

## 2020-02-10 MED ORDER — BENZONATATE 100 MG PO CAPS
100.0000 mg | ORAL_CAPSULE | Freq: Three times a day (TID) | ORAL | 0 refills | Status: DC | PRN
Start: 2020-02-10 — End: 2020-07-03

## 2020-02-10 NOTE — Progress Notes (Signed)
Virtual Visit via Video Note  I connected with Tammy Hughes  on 02/10/20 at  1:20 PM EDT by a video enabled telemedicine application and verified that I am speaking with the correct person using two identifiers.  Location patient: home Location provider:work or home office Persons participating in the virtual visit: patient, provider  I discussed the limitations of evaluation and management by telemedicine and the availability of in person appointments. The patient expressed understanding and agreed to proceed.   HPI:  Acute visit for cough and congestion: -started 2-3 days -symptoms include nasal congestion, PND, cough, sore throat, HA initially, had some chills initially -temp 98.4 -denies NVD, loss of taste or smell, body aches, malaise -denies any known sick contacts  -She had the [place] vaccines in February -she has had some doctor visits and does go to church  ROS: See pertinent positives and negatives per HPI.  Past Medical History:  Diagnosis Date  . Allergy    SEASONAL  . ANXIETY 03/08/2008  . Cataract    REMOVED BILATERAL  . Depression   . DIVERTICULOSIS, COLON 03/08/2008  . DVT (deep venous thrombosis) ([place])   . GERD 02/23/2007  . HYPERCHOLESTEROLEMIA 02/01/2008  . HYPERTENSION 03/08/2008  . Insulin dependent diabetes mellitus   . OSTEOARTHRITIS 02/23/2007  . OSTEOPOROSIS 02/23/2007  . PERIPHERAL NEUROPATHY 02/23/2007  . STD (sexually transmitted disease)   . Stomach cancer ([place]) 07/20/14   invasive adenocarcinoma w/signet rings features  . Urinary incontinence   . Urolithiasis     Past Surgical History:  Procedure Laterality Date  . ABDOMINAL SURGERY    . CARDIAC CATHETERIZATION N/A 02/02/2016   Procedure: Left Heart Cath and Coronary Angiography;  Surgeon: Peter M Martinique, MD;  Location: [facility];  Service: Cardiovascular;  Laterality: N/A;  . CHOLECYSTECTOMY  1995  . ESOPHAGOGASTRODUODENOSCOPY  06/08/2002  . GASTRECTOMY N/A 07/20/2014   Procedure: PROXIMAL  GASTRECTOMY;  Surgeon: Stark Klein, MD;  Location: [facility];  Service: General;  Laterality: N/A;  . GASTROJEJUNOSTOMY N/A 07/20/2014   Procedure: GASTROJEJUNOSTOMY;  Surgeon: Stark Klein, MD;  Location: [facility];  Service: General;  Laterality: N/A;  . LAPAROSCOPY N/A 07/20/2014   Procedure: LAPAROSCOPY DIAGNOSTIC;  Surgeon: Stark Klein, MD;  [facility];  Service: General;  Laterality: N/A;    Family History  Problem Relation Age of Onset  . Cancer Father        Prostate Cancer  . Cancer Sister        Breast Cancer  . Diabetes Sister   . Diabetes Sister   . Other Mother        pneumonia   . Cancer Sister        breast cancer     SOCIAL HX: see hpi   Current Outpatient Medications:  .  Acetaminophen (TYLENOL PO), Take 500 mg by mouth 2 (two) times daily. , Disp: , Rfl:  .  benzonatate (TESSALON PERLES) 100 MG capsule, Take 1 capsule (100 mg total) by mouth 3 (three) times daily as needed., Disp: 20 capsule, Rfl: 0 .  calcium carbonate (TUMS - DOSED IN MG ELEMENTAL CALCIUM) 500 MG chewable tablet, Chew 1 tablet by mouth daily., Disp: , Rfl:  .  cholecalciferol (VITAMIN D3) 25 MCG (1000 UT) tablet, Take 1,000 Units by mouth daily., Disp: , Rfl:  .  clopidogrel (PLAVIX) 75 MG tablet, TAKE 1 TABLET BY MOUTH ONCE DAILY, Disp: 90 tablet, Rfl: 1 .  diclofenac sodium (VOLTAREN) 1 % GEL, Apply 4 g topically 4 (  four) times daily. For knee pain, Disp: 100 g, Rfl: 11 .  donepezil (ARICEPT) 10 MG tablet, Take 1/2 tablet daily for 2 weeks, then increase to 1 tablet daily, Disp: 30 tablet, Rfl: 11 .  esomeprazole (NEXIUM) 40 MG capsule, TAKE 1 CAPSULE BY MOUTH EACH DAY AT NOON, Disp: 30 capsule, Rfl: 8 .  insulin aspart protamine - aspart (NOVOLOG MIX 70/30 FLEXPEN) (70-30) 100 UNIT/ML FlexPen, Inject 0.12 mLs (12 Units total) into the skin 2 (two) times daily with a meal. and pen needles 2/day, Disp: 10 pen, Rfl: 3 .  meclizine (ANTIVERT) 25 MG tablet, TAKE 1 TABLET BY MOUTH THREE TIMES DAILY IF  NEEDED FOR DIZZINESS, Disp: 30 tablet, Rfl: 0 .  mirabegron ER (MYRBETRIQ) 25 MG TB24 tablet, Take 1 tablet (25 mg total) by mouth daily., Disp: 30 tablet, Rfl: 1 .  mirtazapine (REMERON) 15 MG tablet, TAKE 1 TABLET BY MOUTH EACH NIGHT AT BEDTIME AS DIRECTED, Disp: 90 tablet, Rfl: 3 .  ONETOUCH VERIO test strip, USE 1 STRIP TO CHECK GLUCOSE TWICE DAILY, Disp: 100 each, Rfl: 0 .  rosuvastatin (CRESTOR) 5 MG tablet, Take 1 tablet (5 mg total) by mouth daily., Disp: 90 tablet, Rfl: 3  EXAM:  VITALS per patient if applicable:  GENERAL: alert, oriented, appears well and in no acute distress  HEENT: atraumatic, conjunttiva clear, no obvious abnormalities on inspection of external nose and ears  NECK: normal movements of the head and neck  LUNGS: on inspection no signs of respiratory distress, breathing rate appears normal, no obvious gross SOB, gasping or wheezing  CV: no obvious cyanosis  MS: moves all visible extremities without noticeable abnormality  PSYCH/NEURO: pleasant and cooperative, no obvious depression or anxiety, speech and thought processing grossly intact  ASSESSMENT AND PLAN:  Discussed the following assessment and plan:  Cough  Nasal congestion  -we discussed possible serious and likely etiologies, options for evaluation and workup, limitations of telemedicine visit vs in person visit, treatment, treatment risks and precautions. Pt prefers to treat via telemedicine empirically rather then risking or undertaking an in person visit at this moment. Query VURI vs other. Discussed symptomatic care and sent Tessalon for cough. Discussed that [place] is less likely given vaccinated, but that with delta there has been a slight increase in cases even in vaccinated individuals. She opted for COVID19 testing at [facility]. Discussed limitations of testing, treatment, precautions. Patient agrees to seek prompt in person care if worsening, new symptoms arise, or if is not improving with  treatment. Agrees to follow up if COVID test is positive. Agrees to stay home while sick.   I discussed the assessment and treatment plan with the patient. The patient was provided an opportunity to ask questions and all were answered. The patient agreed with the plan and demonstrated an understanding of the instructions.   The patient was advised to call back or seek an in-person evaluation if the symptoms worsen or if the condition fails to improve as anticipated.   Lucretia Kern, DO

## 2020-02-15 ENCOUNTER — Other Ambulatory Visit: Payer: Self-pay | Admitting: Family Medicine

## 2020-02-16 ENCOUNTER — Telehealth: Payer: Self-pay | Admitting: Family Medicine

## 2020-02-16 NOTE — Telephone Encounter (Signed)
..   LAST APPOINTMENT DATE: 02/15/2020   NEXT APPOINTMENT DATE:@9 /02/2020  MEDICATION:mirtazapine (REMERON) 15 MG tablet  PHARMACY: [facility], [place] [address]  **Let patient know to contact pharmacy at the end of the day to make sure medication is ready. **  ** Please notify patient to allow 48-72 hours to process**  **Encourage patient to contact the pharmacy for refills or they can request refills through [facility]**  CLINICAL FILLS OUT ALL BELOW:   LAST REFILL:  QTY:  REFILL DATE:    OTHER COMMENTS:    Okay for refill?  Please advise

## 2020-02-16 NOTE — Telephone Encounter (Signed)
Refill recently sent to pharmacy.  Thanks

## 2020-02-24 ENCOUNTER — Inpatient Hospital Stay: Payer: BC Managed Care – PPO | Attending: Oncology | Admitting: Oncology

## 2020-02-24 ENCOUNTER — Other Ambulatory Visit: Payer: Self-pay

## 2020-02-24 VITALS — BP 148/65 | HR 62 | Temp 98.7°F | Resp 17 | Ht 61.0 in | Wt 122.4 lb

## 2020-02-24 DIAGNOSIS — F329 Major depressive disorder, single episode, unspecified: Secondary | ICD-10-CM | POA: Diagnosis not present

## 2020-02-24 DIAGNOSIS — Z85028 Personal history of other malignant neoplasm of stomach: Secondary | ICD-10-CM | POA: Insufficient documentation

## 2020-02-24 DIAGNOSIS — Z9221 Personal history of antineoplastic chemotherapy: Secondary | ICD-10-CM | POA: Diagnosis not present

## 2020-02-24 DIAGNOSIS — Z923 Personal history of irradiation: Secondary | ICD-10-CM | POA: Insufficient documentation

## 2020-02-24 DIAGNOSIS — E119 Type 2 diabetes mellitus without complications: Secondary | ICD-10-CM | POA: Diagnosis not present

## 2020-02-24 DIAGNOSIS — I252 Old myocardial infarction: Secondary | ICD-10-CM | POA: Insufficient documentation

## 2020-02-24 DIAGNOSIS — Z86718 Personal history of other venous thrombosis and embolism: Secondary | ICD-10-CM | POA: Insufficient documentation

## 2020-02-24 DIAGNOSIS — C169 Malignant neoplasm of stomach, unspecified: Secondary | ICD-10-CM | POA: Diagnosis not present

## 2020-02-24 NOTE — Progress Notes (Signed)
  [facility] OFFICE PROGRESS NOTE   Diagnosis: Gastric cancer  INTERVAL HISTORY:   Ms. Sturdy returns as scheduled.  She feels well.  She had a "cold "approximately 1 month ago after visiting the dentist.  She lost her appetite transiently.  She now has a good appetite.  No nausea.  No difficulty with bowel function.  No complaint. She has received the COVID-19 vaccine. Objective:  Vital signs in last 24 hours:  Blood pressure (!) 148/65, pulse 62, temperature 98.7 F (37.1 C), temperature source Tympanic, resp. rate 17, height 5\' 1"  (1.549 m), weight 122 lb 6.4 oz (55.5 kg), SpO2 98 %.    HEENT: Soft prominence of the left compared to the right submandibular gland, neck without mass Lymphatics: No cervical, supraclavicular, axillary, or inguinal nodes Resp: Bilateral inspiratory rales at the posterior lung fields, no respiratory distress Cardio: Regular rate and rhythm GI: No hepatosplenomegaly, no mass, nontender Vascular: No leg edema, the right lower leg is larger than the left side  Portacath/PICC-without erythema  Lab Results:  Lab Results  Component Value Date   WBC 4.9 09/20/2019   HGB 11.8 (L) 09/20/2019   HCT 36.3 09/20/2019   MCV 91.0 09/20/2019   PLT 311.0 09/20/2019   NEUTROABS 2.7 09/20/2019    CMP  Lab Results  Component Value Date   NA 140 09/20/2019   K 4.2 09/20/2019   CL 105 09/20/2019   CO2 30 09/20/2019   GLUCOSE 202 (H) 09/20/2019   BUN 15 09/20/2019   CREATININE 0.95 09/20/2019   CALCIUM 10.2 09/20/2019   PROT 7.4 09/20/2019   ALBUMIN 3.7 09/20/2019   AST 17 09/20/2019   ALT 13 09/20/2019   ALKPHOS 86 09/20/2019   BILITOT 0.3 09/20/2019   GFRNONAA 48 (L) 11/24/2016   GFRAA 56 (L) 11/24/2016     Medications: I have reviewed the patient's current medications.   Assessment/Plan: 1. Gastric cancer, status post an endoscopic biopsy of a lesser curvature mass on 05/31/2014 confirming adenocarcinoma ? Staging CTs of the  chest and abdomen on 06/08/2014 revealed no evidence of metastatic disease ? Proximal gastrectomy and feeding jejunostomy 07/20/2014, pT2,pN1 ? Adjuvant weekly 5-Fu/leucovorin 09/14/2014, 09/21/2014, 09/28/2014 and 10/05/2014 ? Initiation of radiation and infusional 5-FU 10/17/2014 ? 5-fluorouracil pump discontinued on 10/27/2014 due to toxicity. ? Radiation discontinued 11/02/2014  2. Pulmonary fibrosis 3. Esophageal stricture, status post a dilatation procedure 05/31/2014 4. Diabetes 5. Postoperative ventricle tachycardia, NSTEMI January 2016 6. Hospital-acquired pneumonia January 2016 7. CT abdomen/pelvis 09/02/2014 with postsurgical changes. Focal fluid collection in the midline abutting the distal greater curvature of the stomach measuring 2.2 x 2.9 x 3.4 cm. Mild stranding in the adjacent fat. 8. PICC placement 10/17/2014 9. Rash. Most likely related to the 5-fluorouracil. Resolved. 10. Right upper extremity DVT 10/25/2014. She completed a course of Xarelto. 11. Hospitalization 10/27/2014 through 10/28/2014 nausea/vomiting and left arm discomfort. Noted to have skin toxicity from the 5-fluorouracil. The 5-fluorouracil was discontinued. 12. Depression. She continues Remeron.     Disposition: Ms. Highland is in remission from gastric cancer.  She is now almost 6 years out from diagnosis.  She would like to continue follow-up at the Cancer center.  She will return for an office visit in 6 months.  [name], MD  02/24/2020  2:21 PM

## 2020-03-07 ENCOUNTER — Other Ambulatory Visit: Payer: Self-pay | Admitting: Endocrinology

## 2020-03-07 DIAGNOSIS — Z794 Long term (current) use of insulin: Secondary | ICD-10-CM

## 2020-03-22 ENCOUNTER — Encounter: Payer: Self-pay | Admitting: Family Medicine

## 2020-03-22 ENCOUNTER — Ambulatory Visit (INDEPENDENT_AMBULATORY_CARE_PROVIDER_SITE_OTHER): Payer: Medicare Other | Admitting: Family Medicine

## 2020-03-22 ENCOUNTER — Other Ambulatory Visit: Payer: Self-pay

## 2020-03-22 VITALS — BP 138/60 | HR 52 | Temp 97.3°F | Ht 61.0 in | Wt 122.2 lb

## 2020-03-22 DIAGNOSIS — R634 Abnormal weight loss: Secondary | ICD-10-CM

## 2020-03-22 DIAGNOSIS — I10 Essential (primary) hypertension: Secondary | ICD-10-CM

## 2020-03-22 DIAGNOSIS — E559 Vitamin D deficiency, unspecified: Secondary | ICD-10-CM

## 2020-03-22 NOTE — Progress Notes (Signed)
Patient: Tammy Hughes MRN: [id number] DOB: August 28, 1932 PCP: Orma Flaming, MD     Subjective:  Chief Complaint  Patient presents with  . Hypertension  . Weight Loss  . vitamin d deficiency    HPI: The patient is a 86 y.o. female who presents today for routine  HTN follow up. She says that her Oncologist would like her to gain more weight. She also complains of having crazy dreams.   Hypertension: Here for follow up of hypertension.  She was supposed to bring her blood pressure log today and didn't do this. She was running high for a few visits and states she runs around 062 systolic at home. Currently on no medication.  She does check her blood pressure randomly at home and it's 165/80. Exercise includes none. Weight has been stable. Denies any chest pain, headaches, shortness of breath, vision changes, swelling in lower extremities.   Weight loss She has lost 6 pounds since last December. Her bmi is still wnl. She isn't eating much and doesn't really have an appetite. She eats a salad, sandwich and a piece of cake. She also eats a big breakfast (eggs, bacon, grits and toast). Does not eat much if anything at dinner. Doesn't really snack.   Vitamin D deficiency Doesn't think she is on daily replacement. Low back in march.   Review of Systems  Respiratory: Negative for chest tightness and shortness of breath.   Cardiovascular: Positive for leg swelling. Negative for chest pain and palpitations.  Neurological: Negative for dizziness and headaches.     Allergies Patient is allergic to pirfenidone and aspirin.  Past Medical History Patient  has a past medical history of Allergy, ANXIETY (03/08/2008), Cataract, Depression, DIVERTICULOSIS, COLON (03/08/2008), DVT (deep venous thrombosis) ([place]), GERD (02/23/2007), HYPERCHOLESTEROLEMIA (02/01/2008), HYPERTENSION (03/08/2008), Insulin dependent diabetes mellitus, OSTEOARTHRITIS (02/23/2007), OSTEOPOROSIS (02/23/2007), PERIPHERAL NEUROPATHY  (02/23/2007), STD (sexually transmitted disease), Stomach cancer ([place]) (07/20/14), Urinary incontinence, and Urolithiasis.  Surgical History Patient  has a past surgical history that includes Cholecystectomy (1995); Esophagogastroduodenoscopy (06/08/2002); Gastrectomy (N/A, 07/20/2014); laparoscopy (N/A, 07/20/2014); Gastrojejunostomy (N/A, 07/20/2014); Cardiac catheterization (N/A, 02/02/2016); and Abdominal surgery.  Family History Pateint's family history includes Cancer in her father, sister, and sister; Diabetes in her sister and sister; Other in her mother.  Social History Patient  reports that she has never smoked. She has never used smokeless tobacco. She reports that she does not drink alcohol and does not use drugs.    Objective: Vitals:   03/22/20 1313 03/22/20 1346  BP: (!) 158/83 138/60  Pulse: (!) 52   Temp: (!) 97.3 F (36.3 C)   TempSrc: Temporal   SpO2: 98%   Weight: 122 lb 3.2 oz (55.4 kg)   Height: 5\' 1"  (1.549 m)     Body mass index is 23.09 kg/m.  Physical Exam Vitals reviewed.  Constitutional:      Appearance: Normal appearance. She is normal weight.  HENT:     Head: Normocephalic and atraumatic.  Cardiovascular:     Rate and Rhythm: Normal rate and regular rhythm.     Heart sounds: Murmur heard.   Pulmonary:     Effort: Pulmonary effort is normal.     Breath sounds: Normal breath sounds.  Abdominal:     General: Abdomen is flat. Bowel sounds are normal.     Palpations: Abdomen is soft.  Neurological:     General: No focal deficit present.     Mental Status: She is alert. Mental status is at baseline.  Psychiatric:  Mood and Affect: Mood normal.        Behavior: Behavior normal.        Assessment/plan: 1. Essential hypertension Repeat better. Forgot to bring her log. Want her to try to keep a log at home as her readings sound high. Will also let her daughter know. Routine lab work. F/u in 3 months.  - CBC with Differential/Platelet; Future -  COMPLETE METABOLIC PANEL WITH GFR; Future  2. Weight loss BMI within normal range. Checking labs. Asking her to keep a food journal. likely related to mild dementia. Could consider increasing Remeron. Will discuss with daughter.  - Prealbumin; Future  3. Vitamin D deficiency   - VITAMIN D 25 Hydroxy (Vit-D Deficiency, Fractures); Future   This visit occurred during the SARS-CoV-2 public health emergency.  Safety protocols were in place, including screening questions prior to the visit, additional usage of staff PPE, and extensive cleaning of exam room while observing appropriate contact time as indicated for disinfecting solutions.     Return in about 3 months (around 06/21/2020) for weight check/food journal/blood pressure journal .   Orma Flaming, MD [place] [facility]   03/22/2020

## 2020-03-22 NOTE — Patient Instructions (Addendum)
Weight stable. BMI wihtin normal limits. On medication that helps appetite called remeron.   -encourage more snacks. Keep a log of what you are eating in a day for me and bring on next visit.  -if weight drops we could consider increasing your remeron.   -blood pressure good on repeat. Keep your log at home and bring to next visit for me.    -checking vitamin  D again as well!  Give this to renee for me!   Always good seeing you!  Dr. Rogers Blocker

## 2020-03-23 LAB — CBC WITH DIFFERENTIAL/PLATELET
Absolute Monocytes: 454 cells/uL (ref 200–950)
Basophils Absolute: 28 cells/uL (ref 0–200)
Basophils Relative: 0.5 %
Eosinophils Absolute: 78 cells/uL (ref 15–500)
Eosinophils Relative: 1.4 %
HCT: 35.3 % (ref 35.0–45.0)
Hemoglobin: 11.4 g/dL — ABNORMAL LOW (ref 11.7–15.5)
Lymphs Abs: 2262 cells/uL (ref 850–3900)
MCH: 28.9 pg (ref 27.0–33.0)
MCHC: 32.3 g/dL (ref 32.0–36.0)
MCV: 89.4 fL (ref 80.0–100.0)
MPV: 9.7 fL (ref 7.5–12.5)
Monocytes Relative: 8.1 %
Neutro Abs: 2778 cells/uL (ref 1500–7800)
Neutrophils Relative %: 49.6 %
Platelets: 318 10*3/uL (ref 140–400)
RBC: 3.95 10*6/uL (ref 3.80–5.10)
RDW: 13.1 % (ref 11.0–15.0)
Total Lymphocyte: 40.4 %
WBC: 5.6 10*3/uL (ref 3.8–10.8)

## 2020-03-23 LAB — PREALBUMIN: Prealbumin: 17 mg/dL (ref 17–34)

## 2020-03-23 LAB — VITAMIN D 25 HYDROXY (VIT D DEFICIENCY, FRACTURES): Vit D, 25-Hydroxy: 24 ng/mL — ABNORMAL LOW (ref 30–100)

## 2020-03-23 LAB — COMPLETE METABOLIC PANEL WITH GFR
AG Ratio: 1 (calc) (ref 1.0–2.5)
ALT: 15 U/L (ref 6–29)
AST: 21 U/L (ref 10–35)
Albumin: 3.7 g/dL (ref 3.6–5.1)
Alkaline phosphatase (APISO): 85 U/L (ref 37–153)
BUN/Creatinine Ratio: 18 (calc) (ref 6–22)
BUN: 16 mg/dL (ref 7–25)
CO2: 31 mmol/L (ref 20–32)
Calcium: 10.4 mg/dL (ref 8.6–10.4)
Chloride: 109 mmol/L (ref 98–110)
Creat: 0.91 mg/dL — ABNORMAL HIGH (ref 0.60–0.88)
GFR, Est African American: 67 mL/min/{1.73_m2} (ref >=60)
GFR, Est Non African American: 58 mL/min/{1.73_m2} — ABNORMAL LOW (ref >=60)
Globulin: 3.6 g/dL (calc) (ref 1.9–3.7)
Glucose, Bld: 64 mg/dL — ABNORMAL LOW (ref 65–99)
Potassium: 4 mmol/L (ref 3.5–5.3)
Sodium: 143 mmol/L (ref 135–146)
Total Bilirubin: 0.3 mg/dL (ref 0.2–1.2)
Total Protein: 7.3 g/dL (ref 6.1–8.1)

## 2020-03-27 ENCOUNTER — Telehealth: Payer: Self-pay | Admitting: Family Medicine

## 2020-03-27 ENCOUNTER — Other Ambulatory Visit: Payer: Self-pay | Admitting: Family Medicine

## 2020-03-27 MED ORDER — VITAMIN D (ERGOCALCIFEROL) 1.25 MG (50000 UNIT) PO CAPS: ORAL_CAPSULE | ORAL | 0 refills | Status: DC

## 2020-03-27 NOTE — Telephone Encounter (Signed)
Pt returned CMAs call about lab results

## 2020-03-27 NOTE — Telephone Encounter (Signed)
I spoke with pt, and her daughter-Melinda to give lab result message. Understanding was expressed.

## 2020-03-30 DIAGNOSIS — Z4689 Encounter for fitting and adjustment of other specified devices: Secondary | ICD-10-CM | POA: Diagnosis not present

## 2020-04-18 ENCOUNTER — Other Ambulatory Visit: Payer: Self-pay

## 2020-04-18 ENCOUNTER — Ambulatory Visit (INDEPENDENT_AMBULATORY_CARE_PROVIDER_SITE_OTHER): Payer: Medicare Other | Admitting: Endocrinology

## 2020-04-18 ENCOUNTER — Encounter: Payer: Self-pay | Admitting: Endocrinology

## 2020-04-18 VITALS — BP 150/74 | HR 54 | Ht 61.0 in | Wt 123.8 lb

## 2020-04-18 DIAGNOSIS — Z23 Encounter for immunization: Secondary | ICD-10-CM | POA: Diagnosis not present

## 2020-04-18 DIAGNOSIS — E1142 Type 2 diabetes mellitus with diabetic polyneuropathy: Secondary | ICD-10-CM | POA: Diagnosis not present

## 2020-04-18 DIAGNOSIS — Z794 Long term (current) use of insulin: Secondary | ICD-10-CM

## 2020-04-18 DIAGNOSIS — E119 Type 2 diabetes mellitus without complications: Secondary | ICD-10-CM | POA: Diagnosis not present

## 2020-04-18 LAB — POCT GLYCOSYLATED HEMOGLOBIN (HGB A1C): Hemoglobin A1C: 7.6 % — AB (ref 4.0–5.6)

## 2020-04-18 LAB — T4, FREE: Free T4: 0.56 ng/dL — ABNORMAL LOW (ref 0.60–1.60)

## 2020-04-18 LAB — TSH: TSH: 0.9 u[IU]/mL (ref 0.35–4.50)

## 2020-04-18 NOTE — Patient Instructions (Addendum)
Your blood pressure is high today.  Please see your primary care provider soon, to have it rechecked.   Blood tests are requested for you today.  We'll let you know about the results.   Please come back for a follow-up appointment in 4 months.   

## 2020-04-18 NOTE — Progress Notes (Signed)
Subjective:    Patient ID: Tammy Hughes, female    DOB: Jun 28, 1933, 86 y.o.   MRN: [id number]  HPI Pt returns for f/u of diabetes mellitus:  DM type: Insulin-requiring type 2.  Dx'ed: 9326.   Complications: PN and CAD.  Therapy: insulin since 2005.  GDM: never.   DKA: never.   Severe hypoglycemia: never.   Pancreatitis: never.  Other: she chose bid premixed insulin.   Interval history: no cbg record, but states cbg varies from 150-400. Past Medical History:  Diagnosis Date  . Allergy    SEASONAL  . ANXIETY 03/08/2008  . Cataract    REMOVED BILATERAL  . Depression   . DIVERTICULOSIS, COLON 03/08/2008  . DVT (deep venous thrombosis) ([place])   . GERD 02/23/2007  . HYPERCHOLESTEROLEMIA 02/01/2008  . HYPERTENSION 03/08/2008  . Insulin dependent diabetes mellitus   . OSTEOARTHRITIS 02/23/2007  . OSTEOPOROSIS 02/23/2007  . PERIPHERAL NEUROPATHY 02/23/2007  . STD (sexually transmitted disease)   . Stomach cancer ([place]) 07/20/14   invasive adenocarcinoma w/signet rings features  . Urinary incontinence   . Urolithiasis     Past Surgical History:  Procedure Laterality Date  . ABDOMINAL SURGERY    . CARDIAC CATHETERIZATION N/A 02/02/2016   Procedure: Left Heart Cath and Coronary Angiography;  Surgeon: Peter M Martinique, MD;  Location: [facility];  Service: Cardiovascular;  Laterality: N/A;  . CHOLECYSTECTOMY  1995  . ESOPHAGOGASTRODUODENOSCOPY  06/08/2002  . GASTRECTOMY N/A 07/20/2014   Procedure: PROXIMAL GASTRECTOMY;  Surgeon: Stark Klein, MD;  Location: [facility];  Service: General;  Laterality: N/A;  . GASTROJEJUNOSTOMY N/A 07/20/2014   Procedure: GASTROJEJUNOSTOMY;  Surgeon: Stark Klein, MD;  Location: [facility];  Service: General;  Laterality: N/A;  . LAPAROSCOPY N/A 07/20/2014   Procedure: LAPAROSCOPY DIAGNOSTIC;  Surgeon: Stark Klein, MD;  Location: [facility];  Service: General;  Laterality: N/A;    Social History   Socioeconomic History  . Marital status: Married    Spouse name:  Not on file  . Number of children: Not on file  . Years of education: Not on file  . Highest education level: Not on file  Occupational History  . Occupation: Retired    Fish farm manager: RETIRED  Tobacco Use  . Smoking status: Never Smoker  . Smokeless tobacco: Never Used  Vaping Use  . Vaping Use: Never used  Substance and Sexual Activity  . Alcohol use: No    Alcohol/week: 0.0 standard drinks  . Drug use: No  . Sexual activity: Not Currently    Birth control/protection: Post-menopausal  Other Topics Concern  . Not on file  Social History Narrative   Retired-Domestic Assistant   Right handed    Lives with husband    Social Determinants of Health   Financial Resource Strain:   . Difficulty of Paying Living Expenses: Not on file  Food Insecurity:   . Worried About Charity fundraiser in the Last Year: Not on file  . Ran Out of Food in the Last Year: Not on file  Transportation Needs:   . Lack of Transportation (Medical): Not on file  . Lack of Transportation (Non-Medical): Not on file  Physical Activity:   . Days of Exercise per Week: Not on file  . Minutes of Exercise per Session: Not on file  Stress:   . Feeling of Stress : Not on file  Social Connections:   . Frequency of Communication with Friends and Family: Not on file  . Frequency of  Social Gatherings with Friends and Family: Not on file  . Attends Religious Services: Not on file  . Active Member of Clubs or Organizations: Not on file  . Attends Archivist Meetings: Not on file  . Marital Status: Not on file  Intimate Partner Violence:   . Fear of Current or Ex-Partner: Not on file  . Emotionally Abused: Not on file  . Physically Abused: Not on file  . Sexually Abused: Not on file    Current Outpatient Medications on File Prior to Visit  Medication Sig Dispense Refill  . Acetaminophen (TYLENOL PO) Take 500 mg by mouth 2 (two) times daily.     . benzonatate (TESSALON PERLES) 100 MG capsule Take 1  capsule (100 mg total) by mouth 3 (three) times daily as needed. 20 capsule 0  . calcium carbonate (TUMS - DOSED IN MG ELEMENTAL CALCIUM) 500 MG chewable tablet Chew 1 tablet by mouth daily.     . cholecalciferol (VITAMIN D3) 25 MCG (1000 UT) tablet Take 1,000 Units by mouth daily.    . clopidogrel (PLAVIX) 75 MG tablet TAKE 1 TABLET BY MOUTH ONCE DAILY 90 tablet 1  . donepezil (ARICEPT) 10 MG tablet Take 1/2 tablet daily for 2 weeks, then increase to 1 tablet daily 30 tablet 11  . esomeprazole (NEXIUM) 40 MG capsule TAKE 1 CAPSULE BY MOUTH EACH DAY AT NOON 30 capsule 8  . glucose blood (ONETOUCH VERIO) test strip 1 each by Other route 2 (two) times daily. E11.9 200 each 0  . insulin aspart protamine - aspart (NOVOLOG MIX 70/30 FLEXPEN) (70-30) 100 UNIT/ML FlexPen Inject 0.12 mLs (12 Units total) into the skin 2 (two) times daily with a meal. and pen needles 2/day 10 pen 3  . meclizine (ANTIVERT) 25 MG tablet TAKE 1 TABLET BY MOUTH THREE TIMES DAILY IF NEEDED FOR DIZZINESS 30 tablet 0  . mirabegron ER (MYRBETRIQ) 25 MG TB24 tablet Take 1 tablet (25 mg total) by mouth daily. 30 tablet 1  . mirtazapine (REMERON) 15 MG tablet TAKE 1 TABLET BY MOUTH EACH NIGHT AT BEDTIME AS DIRECTED 90 tablet 3  . rosuvastatin (CRESTOR) 5 MG tablet Take 1 tablet (5 mg total) by mouth daily. 90 tablet 3  . Vitamin D, Ergocalciferol, (DRISDOL) 1.25 MG (50000 UNIT) CAPS capsule One capsule by mouth once a week for 8 weeks. Then take 2000IU/day 8 capsule 0   No current facility-administered medications on file prior to visit.    Allergies  Allergen Reactions  . Pirfenidone Diarrhea and Nausea And Vomiting  . Aspirin     rash    Family History  Problem Relation Age of Onset  . Cancer Father        Prostate Cancer  . Cancer Sister        Breast Cancer  . Diabetes Sister   . Diabetes Sister   . Other Mother        pneumonia   . Cancer Sister        breast cancer     BP (!) 150/74   Pulse (!) 54   Ht 5'  1" (1.549 m)   Wt 123 lb 12.8 oz (56.2 kg)   SpO2 97%   BMI 23.39 kg/m    Review of Systems She denies hypoglycemia and weight change.      Objective:   Physical Exam VITAL SIGNS:  See vs page GENERAL: no distress Pulses: dorsalis pedis intact bilat.   MSK: no deformity of the feet  CV: trace bilat leg edema, and bilat vv's Skin:  no ulcer on the feet.  normal color and temp on the feet. Neuro: sensation is intact to touch on the feet.   Lab Results  Component Value Date   HGBA1C 7.6 (A) 04/18/2020   Lab Results  Component Value Date   TSH 0.66 04/21/2019   Lab Results  Component Value Date   CREATININE 0.91 (H) 03/22/2020   BUN 16 03/22/2020   NA 143 03/22/2020   K 4.0 03/22/2020   CL 109 03/22/2020   CO2 31 03/22/2020       Assessment & Plan:  Insulin-requiring type 2 DM, with PN: this is the best control this pt should aim for, given this regimen, which does match insulin to her changing needs throughout the day Discordance between A1c and reported cbg's.  Check fructosamine Please continue the same insulin.  Patient Instructions  Your blood pressure is high today.  Please see your primary care provider soon, to have it rechecked Blood tests are requested for you today.  We'll let you know about the results.  Please come back for a follow-up appointment in 4 months.

## 2020-04-22 LAB — FRUCTOSAMINE: Fructosamine: 317 umol/L — ABNORMAL HIGH (ref 205–285)

## 2020-04-24 ENCOUNTER — Telehealth: Payer: Self-pay

## 2020-04-24 NOTE — Telephone Encounter (Signed)
Left message for patient to call office regarding lab results and recommendations. 

## 2020-04-24 NOTE — Telephone Encounter (Signed)
New message    Calling for test results  

## 2020-04-24 NOTE — Telephone Encounter (Signed)
Patient aware of lab results and recommendations. 

## 2020-04-24 NOTE — Telephone Encounter (Signed)
-----   Message from Renato Shin, MD sent at 04/22/2020  9:22 AM EDT ----- please contact patient: This is like an A1c of approc 7.8%, which is almost the same as the A1c itself.  Please continue the same insulin.  I'll see you next time.

## 2020-05-10 ENCOUNTER — Telehealth (INDEPENDENT_AMBULATORY_CARE_PROVIDER_SITE_OTHER): Payer: Medicare Other | Admitting: Internal Medicine

## 2020-05-10 VITALS — BP 132/72 | Ht 60.0 in | Wt 121.0 lb

## 2020-05-10 DIAGNOSIS — I10 Essential (primary) hypertension: Secondary | ICD-10-CM

## 2020-05-10 DIAGNOSIS — R001 Bradycardia, unspecified: Secondary | ICD-10-CM | POA: Diagnosis not present

## 2020-05-10 DIAGNOSIS — R42 Dizziness and giddiness: Secondary | ICD-10-CM

## 2020-05-10 NOTE — Patient Instructions (Signed)
Medication Instructions:  No Changes In Medications at this time.  *If you need a refill on your cardiac medications before your next appointment, please call your pharmacy*  Lab Work: None Ordered At This Time.  If you have labs (blood work) drawn today and your tests are completely normal, you will receive your results only by: Marland Kitchen MyChart Message (if you have MyChart) OR . A paper copy in the mail If you have any lab test that is abnormal or we need to change your treatment, we will call you to review the results.  Testing/Procedures: None Ordered At This Time.   Follow-Up: At [facility], you and your health needs are our priority.  As part of our continuing mission to provide you with exceptional heart care, we have created designated Provider Care Teams.  These Care Teams include your primary Cardiologist (physician) and Advanced Practice Providers (APPs -  Physician Assistants and Nurse Practitioners) who all work together to provide you with the care you need, when you need it.  We recommend signing up for the patient portal called "MyChart".  Sign up information is provided on this After Visit Summary.  MyChart is used to connect with patients for Virtual Visits (Telemedicine).  Patients are able to view lab/test results, encounter notes, upcoming appointments, etc.  Non-urgent messages can be sent to your provider as well.   To learn more about what you can do with MyChart, go to [email or web address].    Your next appointment:   6 month(s)  The format for your next appointment:   In Person or virtual   Provider:   Cherlynn Kaiser, MD

## 2020-05-10 NOTE — Progress Notes (Signed)
Virtual Visit via Telephone Note   This visit type was conducted due to national recommendations for restrictions regarding the COVID-19 Pandemic (e.g. social distancing) in an effort to limit this patient's exposure and mitigate transmission in our community.  Due to her co-morbid illnesses, this patient is at least at moderate risk for complications without adequate follow up.  This format is felt to be most appropriate for this patient at this time.  The patient did not have access to video technology/had technical difficulties with video requiring transitioning to audio format only (telephone).  All issues noted in this document were discussed and addressed.  No physical exam could be performed with this format.  Please refer to the patient's chart for her  consent to telehealth for [facility]).    Date:  05/10/2020   ID:  Tammy Hughes, DOB Aug 15, 1932, MRN [id number] The patient was identified using 2 identifiers.  Patient Location: Home Provider Location: Home Office  PCP:  Orma Flaming, MD  Cardiologist:  Elouise Munroe, MD  Electrophysiologist:  None   Evaluation Performed:  Follow-Up Visit  Chief Complaint:  F/u dizziness and bradycardia  History of Present Illness:    Tammy Hughes is a 86 y.o. female with diabetes on insulin, hyperlipidemia, prior gastric adenocarcinoma, who presents today for follow-up.   She denies any issues with dizziness or lightheadedness. Low back pain has improved but persists. She was unable to check her pulse today but is able to check BP and it is within normal limits.   The patient does not have symptoms concerning for COVID-19 infection (fever, chills, cough, or new shortness of breath).    Past Medical History:  Diagnosis Date  . Allergy    SEASONAL  . ANXIETY 03/08/2008  . Cataract    REMOVED BILATERAL  . Depression   . DIVERTICULOSIS, COLON 03/08/2008  . DVT (deep venous thrombosis) ([place])   . GERD 02/23/2007  .  HYPERCHOLESTEROLEMIA 02/01/2008  . HYPERTENSION 03/08/2008  . Insulin dependent diabetes mellitus   . OSTEOARTHRITIS 02/23/2007  . OSTEOPOROSIS 02/23/2007  . PERIPHERAL NEUROPATHY 02/23/2007  . STD (sexually transmitted disease)   . Stomach cancer ([place]) 07/20/14   invasive adenocarcinoma w/signet rings features  . Urinary incontinence   . Urolithiasis    Past Surgical History:  Procedure Laterality Date  . ABDOMINAL SURGERY    . CARDIAC CATHETERIZATION N/A 02/02/2016   Procedure: Left Heart Cath and Coronary Angiography;  Surgeon: Peter M Martinique, MD;  Location: [place] [facility];  Service: Cardiovascular;  Laterality: N/A;  . CHOLECYSTECTOMY  1995  . ESOPHAGOGASTRODUODENOSCOPY  06/08/2002  . GASTRECTOMY N/A 07/20/2014   Procedure: PROXIMAL GASTRECTOMY;  Surgeon: Stark Klein, MD;  Location: [place];  Service: General;  Laterality: N/A;  . GASTROJEJUNOSTOMY N/A 07/20/2014   Procedure: GASTROJEJUNOSTOMY;  Surgeon: Stark Klein, MD;  Location: [place];  Service: General;  Laterality: N/A;  . LAPAROSCOPY N/A 07/20/2014   Procedure: LAPAROSCOPY DIAGNOSTIC;  Surgeon: Stark Klein, MD;  Location: [facility] [place];  Service: General;  Laterality: N/A;     Current Meds  Medication Sig  . Acetaminophen (TYLENOL PO) Take 500 mg by mouth 2 (two) times daily.   . benzonatate (TESSALON PERLES) 100 MG capsule Take 1 capsule (100 mg total) by mouth 3 (three) times daily as needed.  . calcium carbonate (TUMS - DOSED IN MG ELEMENTAL CALCIUM) 500 MG chewable tablet Chew 1 tablet by mouth daily.   . cholecalciferol (VITAMIN D3) 25 MCG (1000 UT) tablet  Take 1,000 Units by mouth daily.  . clopidogrel (PLAVIX) 75 MG tablet TAKE 1 TABLET BY MOUTH ONCE DAILY  . donepezil (ARICEPT) 10 MG tablet Take 1/2 tablet daily for 2 weeks, then increase to 1 tablet daily  . esomeprazole (NEXIUM) 40 MG capsule TAKE 1 CAPSULE BY MOUTH EACH DAY AT NOON  . glucose blood (ONETOUCH VERIO) test strip 1 each by Other route 2 (two) times daily. E11.9   . insulin aspart protamine - aspart (NOVOLOG MIX 70/30 FLEXPEN) (70-30) 100 UNIT/ML FlexPen Inject 0.12 mLs (12 Units total) into the skin 2 (two) times daily with a meal. and pen needles 2/day  . meclizine (ANTIVERT) 25 MG tablet TAKE 1 TABLET BY MOUTH THREE TIMES DAILY IF NEEDED FOR DIZZINESS  . mirabegron ER (MYRBETRIQ) 25 MG TB24 tablet Take 1 tablet (25 mg total) by mouth daily.  . mirtazapine (REMERON) 15 MG tablet TAKE 1 TABLET BY MOUTH EACH NIGHT AT BEDTIME AS DIRECTED  . rosuvastatin (CRESTOR) 5 MG tablet Take 1 tablet (5 mg total) by mouth daily.  . Vitamin D, Ergocalciferol, (DRISDOL) 1.25 MG (50000 UNIT) CAPS capsule One capsule by mouth once a week for 8 weeks. Then take 2000IU/day     Allergies:   Pirfenidone and Aspirin   Social History   Tobacco Use  . Smoking status: Never Smoker  . Smokeless tobacco: Never Used  Vaping Use  . Vaping Use: Never used  Substance Use Topics  . Alcohol use: No    Alcohol/week: 0.0 standard drinks  . Drug use: No     Family Hx: The patient's family history includes Cancer in her father, sister, and sister; Diabetes in her sister and sister; Other in her mother.  ROS:   Please see the history of present illness.     All other systems reviewed and are negative.   Prior CV studies:   The following studies were reviewed today:    Labs/Other Tests and Data Reviewed:    EKG:  No ECG reviewed.  Recent Labs: 03/22/2020: ALT 15; BUN 16; Creat 0.91; Hemoglobin 11.4; Platelets 318; Potassium 4.0; Sodium 143 04/18/2020: TSH 0.90   Recent Lipid Panel Lab Results  Component Value Date/Time   CHOL 118 09/20/2019 11:15 AM   TRIG 119.0 09/20/2019 11:15 AM   HDL 42.00 09/20/2019 11:15 AM   CHOLHDL 3 09/20/2019 11:15 AM   LDLCALC 52 09/20/2019 11:15 AM   LDLDIRECT 159.0 03/19/2019 11:21 AM    Wt Readings from Last 3 Encounters:  05/10/20 121 lb (54.9 kg)  04/18/20 123 lb 12.8 oz (56.2 kg)  03/22/20 122 lb 3.2 oz (55.4 kg)      Risk Assessment/Calculations:      Objective:    Vital Signs:  BP 132/72   Ht 5' (1.524 m)   Wt 121 lb (54.9 kg)   BMI 23.63 kg/m    VITAL SIGNS:  reviewed GEN:  no acute distress RESPIRATORY:  normal respiratory effort, no increased work of breathing NEURO:  alert and oriented x 3, speech normal PSYCH:  normal affect   ASSESSMENT & PLAN:    1. Dizziness   2. Sinus bradycardia, resolved   3. Essential hypertension    Dizziness seems to have improved.  She was unable to take her pulse today, but overall is asymptomatic.  She continues on clopidogrel for history of TIA.  She is not currently on antihypertensive therapy and blood pressure is reasonably stable.  Would avoid AV nodal blocking agents given history of bradycardia.  Continue Crestor for history of hyperlipidemia.  COVID-19 Education: The signs and symptoms of COVID-19 were discussed with the patient and how to seek care for testing (follow up with PCP or arrange E-visit).  The importance of social distancing was discussed today.  Time:   Today, I have spent 15 minutes with the patient with telehealth technology discussing the above problems.     Medication Adjustments/Labs and Tests Ordered: Current medicines are reviewed at length with the patient today.  Concerns regarding medicines are outlined above.   Patient Instructions  Medication Instructions:  No Changes In Medications at this time.  *If you need a refill on your cardiac medications before your next appointment, please call your pharmacy*  Lab Work: None Ordered At This Time.  If you have labs (blood work) drawn today and your tests are completely normal, you will receive your results only by: Marland Kitchen MyChart Message (if you have MyChart) OR . A paper copy in the mail If you have any lab test that is abnormal or we need to change your treatment, we will call you to review the results.  Testing/Procedures: None Ordered At This Time.    Follow-Up: At [facility], you and your health needs are our priority.  As part of our continuing mission to provide you with exceptional heart care, we have created designated Provider Care Teams.  These Care Teams include your primary Cardiologist (physician) and Advanced Practice Providers (APPs -  Physician Assistants and Nurse Practitioners) who all work together to provide you with the care you need, when you need it.  We recommend signing up for the patient portal called "MyChart".  Sign up information is provided on this After Visit Summary.  MyChart is used to connect with patients for Virtual Visits (Telemedicine).  Patients are able to view lab/test results, encounter notes, upcoming appointments, etc.  Non-urgent messages can be sent to your provider as well.   To learn more about what you can do with MyChart, go to [email or web address].    Your next appointment:   6 month(s)  The format for your next appointment:   In Person or virtual   Provider:   Cherlynn Kaiser, MD    Signed, Elouise Munroe, MD  05/10/2020 9:19 AM    [place]

## 2020-05-11 ENCOUNTER — Encounter: Payer: Self-pay | Admitting: Internal Medicine

## 2020-05-15 ENCOUNTER — Other Ambulatory Visit: Payer: Self-pay | Admitting: Family Medicine

## 2020-06-01 ENCOUNTER — Telehealth: Payer: Self-pay

## 2020-06-01 MED ORDER — CLOPIDOGREL BISULFATE 75 MG PO TABS
75.0000 mg | ORAL_TABLET | Freq: Every day | ORAL | 2 refills | Status: DC
Start: 2020-06-01 — End: 2021-06-12

## 2020-06-01 NOTE — Telephone Encounter (Signed)
..   LAST APPOINTMENT DATE: 03/22/2020   NEXT APPOINTMENT DATE:@12 /02/2020  MEDICATION:clopidogrel (PLAVIX) 75 MG tablet donepezil (ARICEPT) 10 MG tablet mirtazapine (REMERON) 15 MG tablet rosuvastatin (CRESTOR) 5 MG tablet   [place], [place] - [phone] [place]

## 2020-06-06 ENCOUNTER — Other Ambulatory Visit: Payer: Self-pay | Admitting: Family Medicine

## 2020-06-06 DIAGNOSIS — Z4689 Encounter for fitting and adjustment of other specified devices: Secondary | ICD-10-CM | POA: Diagnosis not present

## 2020-06-09 ENCOUNTER — Other Ambulatory Visit: Payer: Self-pay | Admitting: Endocrinology

## 2020-06-09 DIAGNOSIS — E119 Type 2 diabetes mellitus without complications: Secondary | ICD-10-CM

## 2020-06-11 ENCOUNTER — Other Ambulatory Visit: Payer: Self-pay | Admitting: Family Medicine

## 2020-06-21 ENCOUNTER — Other Ambulatory Visit: Payer: Self-pay

## 2020-06-21 ENCOUNTER — Ambulatory Visit (INDEPENDENT_AMBULATORY_CARE_PROVIDER_SITE_OTHER): Payer: Medicare Other | Admitting: Family Medicine

## 2020-06-21 ENCOUNTER — Encounter: Payer: Self-pay | Admitting: Family Medicine

## 2020-06-21 VITALS — BP 156/70 | HR 57 | Temp 98.0°F | Ht 60.0 in | Wt 124.4 lb

## 2020-06-21 DIAGNOSIS — M79605 Pain in left leg: Secondary | ICD-10-CM

## 2020-06-21 DIAGNOSIS — I10 Essential (primary) hypertension: Secondary | ICD-10-CM | POA: Diagnosis not present

## 2020-06-21 DIAGNOSIS — M79604 Pain in right leg: Secondary | ICD-10-CM

## 2020-06-21 DIAGNOSIS — R634 Abnormal weight loss: Secondary | ICD-10-CM | POA: Diagnosis not present

## 2020-06-21 MED ORDER — ROSUVASTATIN CALCIUM 5 MG PO TABS
5.0000 mg | ORAL_TABLET | Freq: Every day | ORAL | 3 refills | Status: DC
Start: 2020-06-21 — End: 2021-07-03

## 2020-06-21 MED ORDER — ESOMEPRAZOLE MAGNESIUM 40 MG PO CPDR
DELAYED_RELEASE_CAPSULE | ORAL | 3 refills | Status: DC
Start: 2020-06-21 — End: 2021-07-17

## 2020-06-21 MED ORDER — DONEPEZIL HCL 10 MG PO TABS
ORAL_TABLET | ORAL | 11 refills | Status: DC
Start: 2020-06-21 — End: 2020-07-13

## 2020-06-21 MED ORDER — MIRTAZAPINE 15 MG PO TABS
ORAL_TABLET | ORAL | 3 refills | Status: DC
Start: 2020-06-21 — End: 2021-07-03

## 2020-06-21 NOTE — Progress Notes (Signed)
Patient: Tammy Hughes MRN: [id number] DOB: 07-31-1932 PCP: Orma Flaming, MD     Subjective:  Chief Complaint  Patient presents with  . Hypertension  . Weight Check  . leg pain at night    HPI: The patient is a 86 y.o. female who presents today for HTN, weight check and has complaints of leg cramping at night.   Hypertension: Here for follow up of hypertension.  Currently on no medication . Home readings range from 267 TIWPYKDX/83 diastolic. Takes medication as prescribed and denies any side effects. Exercise includes none. Weight has been stable. Denies any chest pain, headaches, shortness of breath, vision changes, swelling in lower extremities.    Weight check Daughter was concerned last visit that she wasn't eating well. I asked that she keep a log for me and she hasn't done this. Daughter is with her and states she is doing a much better job eating. She does more of a graze than full meals. She has gained 2 pounds since I last saw her.   Leg pain at night -She has been complaining of aching leg pain nearly nightly that wakes her up at around 2-3 AM. She uses a cream to put on her legs and she will eventually go back to sleep. She doesn't take any naps during the day. She doesn't drink caffeine. She drinks hot milk and this has helped as well. She does not drink a lot of a water. She is not doing much and stays around the house. She will occasionally have leg pain during the day, but mainly at night. When I ask if cramping or pain it's hard to tell which she is having.   Review of Systems  Constitutional: Negative for chills, fatigue, fever and unexpected weight change.  HENT: Negative for dental problem, ear pain, hearing loss and trouble swallowing.   Eyes: Negative for visual disturbance.  Respiratory: Negative for cough, chest tightness and shortness of breath.   Cardiovascular: Negative for chest pain, palpitations and leg swelling.  Gastrointestinal: Negative for  abdominal pain, blood in stool, diarrhea and nausea.  Endocrine: Negative for cold intolerance, polydipsia, polyphagia and polyuria.  Genitourinary: Negative for dysuria and hematuria.  Musculoskeletal: Positive for arthralgias.  Skin: Negative for rash.  Neurological: Negative for dizziness and headaches.  Psychiatric/Behavioral: Negative for dysphoric mood and sleep disturbance. The patient is not nervous/anxious.     Allergies Patient is allergic to pirfenidone and aspirin.  Past Medical History Patient  has a past medical history of Allergy, ANXIETY (03/08/2008), Cataract, Depression, DIVERTICULOSIS, COLON (03/08/2008), DVT (deep venous thrombosis) ([place]), GERD (02/23/2007), HYPERCHOLESTEROLEMIA (02/01/2008), HYPERTENSION (03/08/2008), Insulin dependent diabetes mellitus, OSTEOARTHRITIS (02/23/2007), OSTEOPOROSIS (02/23/2007), PERIPHERAL NEUROPATHY (02/23/2007), STD (sexually transmitted disease), Stomach cancer ([place]) (07/20/14), Urinary incontinence, and Urolithiasis.  Surgical History Patient  has a past surgical history that includes Cholecystectomy (1995); Esophagogastroduodenoscopy (06/08/2002); Gastrectomy (N/A, 07/20/2014); laparoscopy (N/A, 07/20/2014); Gastrojejunostomy (N/A, 07/20/2014); Cardiac catheterization (N/A, 02/02/2016); and Abdominal surgery.  Family History Pateint's family history includes Cancer in her father, sister, and sister; Diabetes in her sister and sister; Other in her mother.  Social History Patient  reports that she has never smoked. She has never used smokeless tobacco. She reports that she does not drink alcohol and does not use drugs.    Objective: Vitals:   06/21/20 1314 06/21/20 1416  BP: (!) 156/66 (!) 156/70  Pulse: (!) 57   Temp: 98 F (36.7 C)   TempSrc: Temporal   SpO2: 97%   Weight: 124 lb 6.4  oz (56.4 kg)   Height: 5' (1.524 m)     Body mass index is 24.3 kg/m.  Physical Exam Vitals reviewed.  Constitutional:      Appearance: Normal  appearance. She is well-developed and normal weight.  HENT:     Head: Normocephalic and atraumatic.     Right Ear: External ear normal.     Left Ear: External ear normal.  Eyes:     Conjunctiva/sclera: Conjunctivae normal.     Pupils: Pupils are equal, round, and reactive to light.  Neck:     Thyroid: No thyromegaly.     Vascular: No carotid bruit.  Cardiovascular:     Rate and Rhythm: Normal rate and regular rhythm.     Heart sounds: Normal heart sounds. No murmur heard.   Pulmonary:     Effort: Pulmonary effort is normal.     Breath sounds: Normal breath sounds.  Abdominal:     General: Bowel sounds are normal. There is no distension.     Palpations: Abdomen is soft.     Tenderness: There is no abdominal tenderness.  Musculoskeletal:        General: No swelling or tenderness.     Cervical back: Normal range of motion and neck supple.  Lymphadenopathy:     Cervical: No cervical adenopathy.  Skin:    General: Skin is warm and dry.     Findings: No rash.  Neurological:     Mental Status: She is alert. Mental status is at baseline.     Cranial Nerves: No cranial nerve deficit.     Coordination: Coordination normal.     Deep Tendon Reflexes: Reflexes normal.  Psychiatric:        Mood and Affect: Mood normal.        Behavior: Behavior normal.        Assessment/plan: 1. Essential hypertension Above goal. Again, did not bring log. Has been to goal at last visit and at cardiologists even though via telehealth. Really encouraged her to keep a log for me and asked daughter to bring at next visit. May need anti hypertensive control. Avoid AV nodal blockers with hx of bradycardia.   2. Leg pain, bilateral Pain vs. Cramping difficult to differentiate from her history. Will check labs and recommended stretching, water and trial of a b complex vitamin for cramping. For pain trial of voltaren gel. Would like to try more conservative with her age. If not helping they are to call me in  2-4 weeks.  - CBC with Differential/Platelet; Future - Comprehensive metabolic panel; Future - TSH; Future - CK; Future - Magnesium; Future - Magnesium - CK - TSH - Comprehensive metabolic panel - CBC with Differential/Platelet  3. Weight loss She has actually gained 2 pounds and is eating more. Did not bring food log. Daughter is not as concerned. aricept could also play a part on this, but she is not losing. Will continue to monitor.    This visit occurred during the SARS-CoV-2 public health emergency.  Safety protocols were in place, including screening questions prior to the visit, additional usage of staff PPE, and extensive cleaning of exam room while observing appropriate contact time as indicated for disinfecting solutions.     Return in about 6 months (around 12/20/2020) for blood pressure/chol/fasting labs .   Orma Flaming, MD [place]   06/21/2020

## 2020-06-21 NOTE — Patient Instructions (Addendum)
For your leg cramps at night -stretch before you go to bed -try to drink more water during the day -try voltaren gel at night on legs (anti inflammatory to help pain) -try a b complex vitamin daily as well.  -could try mustard as well.   -melatonin (extended release) over the counter -ashwagandha is great for sleep as well 300mg  30 minutes before bed.   IF this doesn't work we will figure out something else for pain.   Bring your blood pressure log with you!    Merry christmas!  Dr. Rogers Blocker

## 2020-06-22 LAB — CBC WITH DIFFERENTIAL/PLATELET
Absolute Monocytes: 420 cells/uL (ref 200–950)
Basophils Absolute: 20 cells/uL (ref 0–200)
Basophils Relative: 0.4 %
Eosinophils Absolute: 90 cells/uL (ref 15–500)
Eosinophils Relative: 1.8 %
HCT: 37.2 % (ref 35.0–45.0)
Hemoglobin: 12.3 g/dL (ref 11.7–15.5)
Lymphs Abs: 2060 cells/uL (ref 850–3900)
MCH: 29.8 pg (ref 27.0–33.0)
MCHC: 33.1 g/dL (ref 32.0–36.0)
MCV: 90.1 fL (ref 80.0–100.0)
MPV: 9.8 fL (ref 7.5–12.5)
Monocytes Relative: 8.4 %
Neutro Abs: 2410 cells/uL (ref 1500–7800)
Neutrophils Relative %: 48.2 %
Platelets: 317 10*3/uL (ref 140–400)
RBC: 4.13 10*6/uL (ref 3.80–5.10)
RDW: 13 % (ref 11.0–15.0)
Total Lymphocyte: 41.2 %
WBC: 5 10*3/uL (ref 3.8–10.8)

## 2020-06-22 LAB — COMPREHENSIVE METABOLIC PANEL
AG Ratio: 1.1 (calc) (ref 1.0–2.5)
ALT: 16 U/L (ref 6–29)
AST: 22 U/L (ref 10–35)
Albumin: 4 g/dL (ref 3.6–5.1)
Alkaline phosphatase (APISO): 86 U/L (ref 37–153)
BUN/Creatinine Ratio: 21 (calc) (ref 6–22)
BUN: 19 mg/dL (ref 7–25)
CO2: 29 mmol/L (ref 20–32)
Calcium: 10.7 mg/dL — ABNORMAL HIGH (ref 8.6–10.4)
Chloride: 108 mmol/L (ref 98–110)
Creat: 0.9 mg/dL — ABNORMAL HIGH (ref 0.60–0.88)
Globulin: 3.8 g/dL (calc) — ABNORMAL HIGH (ref 1.9–3.7)
Glucose, Bld: 101 mg/dL — ABNORMAL HIGH (ref 65–99)
Potassium: 4.5 mmol/L (ref 3.5–5.3)
Sodium: 143 mmol/L (ref 135–146)
Total Bilirubin: 0.3 mg/dL (ref 0.2–1.2)
Total Protein: 7.8 g/dL (ref 6.1–8.1)

## 2020-06-22 LAB — CK: Total CK: 98 U/L (ref 29–143)

## 2020-06-22 LAB — TSH: TSH: 0.69 mIU/L (ref 0.40–4.50)

## 2020-06-22 LAB — MAGNESIUM: Magnesium: 2.2 mg/dL (ref 1.5–2.5)

## 2020-06-25 ENCOUNTER — Encounter: Payer: Self-pay | Admitting: Family Medicine

## 2020-06-29 DIAGNOSIS — M3501 Sicca syndrome with keratoconjunctivitis: Secondary | ICD-10-CM | POA: Diagnosis not present

## 2020-07-03 ENCOUNTER — Ambulatory Visit (INDEPENDENT_AMBULATORY_CARE_PROVIDER_SITE_OTHER): Payer: Medicare Other

## 2020-07-03 DIAGNOSIS — Z0000 Encounter for general adult medical examination without abnormal findings: Secondary | ICD-10-CM

## 2020-07-03 NOTE — Patient Instructions (Addendum)
Tammy Hughes , Thank you for taking time to come for your Medicare Wellness Visit. I appreciate your ongoing commitment to your health goals. Please review the following plan we discussed and let me know if I can assist you in the future.   Screening recommendations/referrals: Colonoscopy: No longer required Mammogram: No longer required  Bone Density: No longer required Recommended yearly ophthalmology/optometry visit for glaucoma screening and checkup Recommended yearly dental visit for hygiene and checkup  Vaccinations: Influenza vaccine: Up to date  Done 04/18/20 Pneumococcal vaccine: Up to date Tdap vaccine: Up to date Shingles vaccine: Shingrix discussed. Please contact your pharmacy for coverage information.    Covid-19:Completed 2/25, 3/25, & 05/09/20  Advanced directives: Advance directive discussed with you today. Even though you declined this today please call our office should you change your mind and we can give you the proper paperwork for you to fill out.  Conditions/risks identified: none at this time  Next appointment: Follow up in one year for your annual wellness visit    Preventive Care 84 Years and Older, Female Preventive care refers to lifestyle choices and visits with your health care provider that can promote health and wellness. What does preventive care include?  A yearly physical exam. This is also called an annual well check.  Dental exams once or twice a year.  Routine eye exams. Ask your health care provider how often you should have your eyes checked.  Personal lifestyle choices, including:  Daily care of your teeth and gums.  Regular physical activity.  Eating a healthy diet.  Avoiding tobacco and drug use.  Limiting alcohol use.  Practicing safe sex.  Taking low-dose aspirin every day.  Taking vitamin and mineral supplements as recommended by your health care provider. What happens during an annual well check? The services and  screenings done by your health care provider during your annual well check will depend on your age, overall health, lifestyle risk factors, and family history of disease. Counseling  Your health care provider may ask you questions about your:  Alcohol use.  Tobacco use.  Drug use.  Emotional well-being.  Home and relationship well-being.  Sexual activity.  Eating habits.  History of falls.  Memory and ability to understand (cognition).  Work and work Statistician.  Reproductive health. Screening  You may have the following tests or measurements:  Height, weight, and BMI.  Blood pressure.  Lipid and cholesterol levels. These may be checked every 5 years, or more frequently if you are over 77 years old.  Skin check.  Lung cancer screening. You may have this screening every year starting at age 80 if you have a 30-pack-year history of smoking and currently smoke or have quit within the past 15 years.  Fecal occult blood test (FOBT) of the stool. You may have this test every year starting at age 40.  Flexible sigmoidoscopy or colonoscopy. You may have a sigmoidoscopy every 5 years or a colonoscopy every 10 years starting at age 37.  Hepatitis C blood test.  Hepatitis B blood test.  Sexually transmitted disease (STD) testing.  Diabetes screening. This is done by checking your blood sugar (glucose) after you have not eaten for a while (fasting). You may have this done every 1-3 years.  Bone density scan. This is done to screen for osteoporosis. You may have this done starting at age 39.  Mammogram. This may be done every 1-2 years. Talk to your health care provider about how often you should have regular  mammograms. Talk with your health care provider about your test results, treatment options, and if necessary, the need for more tests. Vaccines  Your health care provider may recommend certain vaccines, such as:  Influenza vaccine. This is recommended every  year.  Tetanus, diphtheria, and acellular pertussis (Tdap, Td) vaccine. You may need a Td booster every 10 years.  Zoster vaccine. You may need this after age 85.  Pneumococcal 13-valent conjugate (PCV13) vaccine. One dose is recommended after age 48.  Pneumococcal polysaccharide (PPSV23) vaccine. One dose is recommended after age 40. Talk to your health care provider about which screenings and vaccines you need and how often you need them. This information is not intended to replace advice given to you by your health care provider. Make sure you discuss any questions you have with your health care provider. Document Released: 07/28/2015 Document Revised: 03/20/2016 Document Reviewed: 05/02/2015 Elsevier Interactive Patient Education  2017 [place] Prevention in the Home Falls can cause injuries. They can happen to people of all ages. There are many things you can do to make your home safe and to help prevent falls. What can I do on the outside of my home?  Regularly fix the edges of walkways and driveways and fix any cracks.  Remove anything that might make you trip as you walk through a door, such as a raised step or threshold.  Trim any bushes or trees on the path to your home.  Use bright outdoor lighting.  Clear any walking paths of anything that might make someone trip, such as rocks or tools.  Regularly check to see if handrails are loose or broken. Make sure that both sides of any steps have handrails.  Any raised decks and porches should have guardrails on the edges.  Have any leaves, snow, or ice cleared regularly.  Use sand or salt on walking paths during winter.  Clean up any spills in your garage right away. This includes oil or grease spills. What can I do in the bathroom?  Use night lights.  Install grab bars by the toilet and in the tub and shower. Do not use towel bars as grab bars.  Use non-skid mats or decals in the tub or shower.  If you  need to sit down in the shower, use a plastic, non-slip stool.  Keep the floor dry. Clean up any water that spills on the floor as soon as it happens.  Remove soap buildup in the tub or shower regularly.  Attach bath mats securely with double-sided non-slip rug tape.  Do not have throw rugs and other things on the floor that can make you trip. What can I do in the bedroom?  Use night lights.  Make sure that you have a light by your bed that is easy to reach.  Do not use any sheets or blankets that are too big for your bed. They should not hang down onto the floor.  Have a firm chair that has side arms. You can use this for support while you get dressed.  Do not have throw rugs and other things on the floor that can make you trip. What can I do in the kitchen?  Clean up any spills right away.  Avoid walking on wet floors.  Keep items that you use a lot in easy-to-reach places.  If you need to reach something above you, use a strong step stool that has a grab bar.  Keep electrical cords out of the way.  Do not use floor polish or wax that makes floors slippery. If you must use wax, use non-skid floor wax.  Do not have throw rugs and other things on the floor that can make you trip. What can I do with my stairs?  Do not leave any items on the stairs.  Make sure that there are handrails on both sides of the stairs and use them. Fix handrails that are broken or loose. Make sure that handrails are as long as the stairways.  Check any carpeting to make sure that it is firmly attached to the stairs. Fix any carpet that is loose or worn.  Avoid having throw rugs at the top or bottom of the stairs. If you do have throw rugs, attach them to the floor with carpet tape.  Make sure that you have a light switch at the top of the stairs and the bottom of the stairs. If you do not have them, ask someone to add them for you. What else can I do to help prevent falls?  Wear shoes  that:  Do not have high heels.  Have rubber bottoms.  Are comfortable and fit you well.  Are closed at the toe. Do not wear sandals.  If you use a stepladder:  Make sure that it is fully opened. Do not climb a closed stepladder.  Make sure that both sides of the stepladder are locked into place.  Ask someone to hold it for you, if possible.  Clearly mark and make sure that you can see:  Any grab bars or handrails.  First and last steps.  Where the edge of each step is.  Use tools that help you move around (mobility aids) if they are needed. These include:  Canes.  Walkers.  Scooters.  Crutches.  Turn on the lights when you go into a dark area. Replace any light bulbs as soon as they burn out.  Set up your furniture so you have a clear path. Avoid moving your furniture around.  If any of your floors are uneven, fix them.  If there are any pets around you, be aware of where they are.  Review your medicines with your doctor. Some medicines can make you feel dizzy. This can increase your chance of falling. Ask your doctor what other things that you can do to help prevent falls. This information is not intended to replace advice given to you by your health care provider. Make sure you discuss any questions you have with your health care provider. Document Released: 04/27/2009 Document Revised: 12/07/2015 Document Reviewed: 08/05/2014 Elsevier Interactive Patient Education  2017 [employer].

## 2020-07-03 NOTE — Progress Notes (Signed)
Virtual Visit via Telephone Note  I connected with  SANARI OFFNER on 07/03/20 at  3:15 PM EST by telephone and verified that I am speaking with the correct person using two identifiers.  Medicare Annual Wellness visit completed telephonically due to Covid-19 pandemic.   Persons participating in this call: This Health Coach and this patient  And husband Marland Kitchen  Location: Patient: Home  Provider: Office    I discussed the limitations, risks, security and privacy concerns of performing an evaluation and management service by telephone and the availability of in person appointments. The patient expressed understanding and agreed to proceed.  Unable to perform video visit due to video visit attempted and failed and/or patient does not have video capability.   Some vital signs may be absent or patient reported.   Willette Brace, LPN   Subjective:   Tammy Hughes is a 86 y.o. female who presents for Medicare Annual (Subsequent) preventive examination.  Review of Systems     Cardiac Risk Factors include: diabetes mellitus;hypertension     Objective:    There were no vitals filed for this visit. There is no height or weight on file to calculate BMI.  Advanced Directives 07/03/2020 02/24/2020 12/28/2019 06/17/2019 08/24/2018 11/21/2017 11/24/2016  Does Patient Have a Medical Advance Directive? No No No No No No No  Would patient like information on creating a medical advance directive? No - Patient declined No - Patient declined - Yes (MAU/Ambulatory/Procedural Areas - Information given) No - Patient declined - -    Current Medications (verified) Outpatient Encounter Medications as of 07/03/2020  Medication Sig  . Acetaminophen (TYLENOL PO) Take 500 mg by mouth 2 (two) times daily.   . calcium carbonate (TUMS - DOSED IN MG ELEMENTAL CALCIUM) 500 MG chewable tablet Chew 1 tablet by mouth daily.   . cholecalciferol (VITAMIN D3) 25 MCG (1000 UT) tablet Take 1,000 Units by mouth daily.   . clopidogrel (PLAVIX) 75 MG tablet Take 1 tablet (75 mg total) by mouth daily.  Marland Kitchen donepezil (ARICEPT) 10 MG tablet Take 1/2 tablet daily for 2 weeks, then increase to 1 tablet daily  . esomeprazole (NEXIUM) 40 MG capsule Take one pill daily for stomach  . insulin aspart protamine - aspart (NOVOLOG MIX 70/30 FLEXPEN) (70-30) 100 UNIT/ML FlexPen Inject 0.12 mLs (12 Units total) into the skin 2 (two) times daily with a meal. and pen needles 2/day  . meclizine (ANTIVERT) 25 MG tablet TAKE 1 TABLET BY MOUTH THREE TIMES DAILY IF NEEDED FOR DIZZINESS  . mirabegron ER (MYRBETRIQ) 25 MG TB24 tablet Take 1 tablet (25 mg total) by mouth daily.  . mirtazapine (REMERON) 15 MG tablet TAKE 1 TABLET BY MOUTH EACH NIGHT AT BEDTIME AS DIRECTED  . ONETOUCH VERIO test strip USE 1 STRIP TO CHECK GLUCOSE TWICE DAILY  . Vitamin D, Ergocalciferol, (DRISDOL) 1.25 MG (50000 UNIT) CAPS capsule One capsule by mouth once a week for 8 weeks. Then take 2000IU/day  . rosuvastatin (CRESTOR) 5 MG tablet Take 1 tablet (5 mg total) by mouth daily.  . [DISCONTINUED] benzonatate (TESSALON PERLES) 100 MG capsule Take 1 capsule (100 mg total) by mouth 3 (three) times daily as needed. (Patient not taking: No sig reported)   No facility-administered encounter medications on file as of 07/03/2020.    Allergies (verified) Pirfenidone and Aspirin   History: Past Medical History:  Diagnosis Date  . Allergy    SEASONAL  . ANXIETY 03/08/2008  . Cataract    REMOVED  BILATERAL  . Depression   . DIVERTICULOSIS, COLON 03/08/2008  . DVT (deep venous thrombosis) ([place])   . GERD 02/23/2007  . HYPERCHOLESTEROLEMIA 02/01/2008  . HYPERTENSION 03/08/2008  . Insulin dependent diabetes mellitus   . OSTEOARTHRITIS 02/23/2007  . OSTEOPOROSIS 02/23/2007  . PERIPHERAL NEUROPATHY 02/23/2007  . STD (sexually transmitted disease)   . Stomach cancer ([place]) 07/20/14   invasive adenocarcinoma w/signet rings features  . Urinary incontinence   . Urolithiasis     Past Surgical History:  Procedure Laterality Date  . ABDOMINAL SURGERY    . CARDIAC CATHETERIZATION N/A 02/02/2016   Procedure: Left Heart Cath and Coronary Angiography;  Surgeon: Peter M Martinique, MD;  Location: [facility];  Service: Cardiovascular;  Laterality: N/A;  . CHOLECYSTECTOMY  1995  . ESOPHAGOGASTRODUODENOSCOPY  06/08/2002  . GASTRECTOMY N/A 07/20/2014   Procedure: PROXIMAL GASTRECTOMY;  Surgeon: Stark Klein, MD;  Location: [facility];  Service: General;  Laterality: N/A;  . GASTROJEJUNOSTOMY N/A 07/20/2014   Procedure: GASTROJEJUNOSTOMY;  Surgeon: Stark Klein, MD;  Location: [facility];  Service: General;  Laterality: N/A;  . LAPAROSCOPY N/A 07/20/2014   Procedure: LAPAROSCOPY DIAGNOSTIC;  Surgeon: Stark Klein, MD;  Location: [facility];  Service: General;  Laterality: N/A;   Family History  Problem Relation Age of Onset  . Cancer Father        Prostate Cancer  . Cancer Sister        Breast Cancer  . Diabetes Sister   . Diabetes Sister   . Other Mother        pneumonia   . Cancer Sister        breast cancer    Social History   Socioeconomic History  . Marital status: Married    Spouse name: Not on file  . Number of children: Not on file  . Years of education: Not on file  . Highest education level: Not on file  Occupational History  . Occupation: Retired    Fish farm manager: RETIRED  Tobacco Use  . Smoking status: Never Smoker  . Smokeless tobacco: Never Used  Vaping Use  . Vaping Use: Never used  Substance and Sexual Activity  . Alcohol use: No    Alcohol/week: 0.0 standard drinks  . Drug use: No  . Sexual activity: Not Currently    Birth control/protection: Post-menopausal  Other Topics Concern  . Not on file  Social History Narrative   Retired-Domestic Assistant   Right handed    Lives with husband    Social Determinants of Health   Financial Resource Strain: Low Risk   . Difficulty of Paying Living Expenses: Not hard at all  Food Insecurity: No Food  Insecurity  . Worried About Charity fundraiser in the Last Year: Never true  . Ran Out of Food in the Last Year: Never true  Transportation Needs: No Transportation Needs  . Lack of Transportation (Medical): No  . Lack of Transportation (Non-Medical): No  Physical Activity: Inactive  . Days of Exercise per Week: 0 days  . Minutes of Exercise per Session: 0 min  Stress: No Stress Concern Present  . Feeling of Stress : Not at all  Social Connections: Moderately Integrated  . Frequency of Communication with Friends and Family: More than three times a week  . Frequency of Social Gatherings with Friends and Family: More than three times a week  . Attends Religious Services: More than 4 times per year  . Active Member of Clubs or Organizations: No  .  Attends Archivist Meetings: Never  . Marital Status: Married    Tobacco Counseling Counseling given: Not Answered   Clinical Intake:  Pre-visit preparation completed: Yes  Pain : No/denies pain     BMI - recorded: 24.3 Nutritional Status: BMI of 19-24  Normal Nutritional Risks: None Diabetes: Yes CBG done?: Yes (298 pt states after meal) CBG resulted in Enter/ Edit results?: No Did pt. bring in CBG monitor from home?: No  How often do you need to have someone help you when you read instructions, pamphlets, or other written materials from your doctor or pharmacy?: 1 - Never  Diabetic?Nutrition Risk Assessment:  Has the patient had any N/V/D within the last 2 months?  No  Does the patient have any non-healing wounds?  No  Has the patient had any unintentional weight loss or weight gain?  No   Diabetes:  Is the patient diabetic?  Yes  If diabetic, was a CBG obtained today?  Yes  Did the patient bring in their glucometer from home?  No  How often do you monitor your CBG's? Daily .   Financial Strains and Diabetes Management:  Are you having any financial strains with the device, your supplies or your medication?  No .  Does the patient want to be seen by Chronic Care Management for management of their diabetes?  No  Would the patient like to be referred to a Nutritionist or for Diabetic Management?  No   Diabetic Exams:  Diabetic Eye Exam: Completed 02/07/20 Diabetic Foot Exam: Completed 04/18/20    Interpreter Needed?: No  Information entered by :: Charlott Rakes, LPN   Activities of Daily Living In your present state of health, do you have any difficulty performing the following activities: 07/03/2020  Hearing? N  Vision? N  Difficulty concentrating or making decisions? Y  Comment memory at times  Walking or climbing stairs? N  Dressing or bathing? N  Doing errands, shopping? N  Preparing Food and eating ? N  Using the Toilet? N  In the past six months, have you accidently leaked urine? N  Do you have problems with loss of bowel control? N  Managing your Medications? N  Managing your Finances? N  Housekeeping or managing your Housekeeping? N  Some recent data might be hidden    Patient Care Team: Orma Flaming, MD as PCP - General (Family Medicine) Elouise Munroe, MD as PCP - Cardiology (Cardiology) Deneise Lever, MD as Attending Physician (Pulmonary Disease) Inda Castle, MD (Inactive) as Attending Physician (Gastroenterology) Elsie Stain, MD as Consulting Physician (Pulmonary Disease) Ladell Pier, MD as Consulting Physician (Oncology) Renato Shin, MD as Consulting Physician (Endocrinology) Myra Gianotti  as Consulting Physician (Optometry) Delice Lesch Lezlie Octave, MD as Consulting Physician (Neurology) Royston Sinner Colin Benton, MD as Consulting Physician (Obstetrics and Gynecology)  Indicate any recent Medical Services you may have received from other than [facility] providers in the past year (date may be approximate).     Assessment:   This is a routine wellness examination for [place].  Hearing/Vision screen  Hearing Screening   125Hz  250Hz  500Hz  1000Hz   2000Hz  3000Hz  4000Hz  6000Hz  8000Hz   Right ear:           Left ear:           Comments: Pt stated mild loss  Vision Screening Comments: Pt follow up with [facility] for exams   Dietary issues and exercise activities discussed: Current Exercise Habits: The patient does not participate  in regular exercise at present  Goals    . Patient Stated     Maintain independence.     . Patient Stated     None at this times      Depression Screen PHQ 2/9 Scores 07/03/2020 03/22/2020 01/27/2020 06/17/2019 08/13/2018 05/04/2015 09/21/2014  PHQ - 2 Score 0 0 0 0 0 0 -  Exception Documentation - - - - - - Medical reason    Fall Risk Fall Risk  07/03/2020 03/22/2020 01/27/2020 01/19/2020 12/28/2019  Falls in the past year? 0 0 0 0 0  Number falls in past yr: 0 - - - 0  Injury with Fall? 0 - - - 0  Risk for fall due to : Impaired vision;Impaired balance/gait No Fall Risks - No Fall Risks Impaired balance/gait  Risk for fall due to: Comment related to vertigo at times - - - -  Follow up - - - - -    [place]:  Any stairs in or around the home? No  If so, are there any without handrails? No  Home free of loose throw rugs in walkways, pet beds, electrical cords, etc? Yes  Adequate lighting in your home to reduce risk of falls? Yes   ASSISTIVE DEVICES UTILIZED TO PREVENT FALLS:  Life alert? No  Use of a cane, walker or w/c? No  Grab bars in the bathroom? No  Shower chair or bench in shower? Yes  Elevated toilet seat or a handicapped toilet? No   TIMED UP AND GO:  Was the test performed? No .      Cognitive Function: MMSE - Mini Mental State Exam 09/20/2019  Orientation to time 5  Orientation to Place 4  Registration 3  Attention/ Calculation 3  Recall 2  Language- name 2 objects 2  Language- repeat 0  Language- follow 3 step command 2  Language- read & follow direction 1  Write a sentence 1  Copy design 1  Total score 24     6CIT Screen 07/03/2020  06/17/2019  What Year? 0 points 0 points  What month? 0 points 0 points  What time? - 0 points  Count back from 20 2 points 0 points  Months in reverse 4 points 0 points  Repeat phrase 6 points 2 points  Total Score - 2    Immunizations Immunization History  Administered Date(s) Administered  . Fluad Quad(high Dose 65+) 03/19/2019, 04/18/2020  . Influenza Split 07/15/2009, 07/15/2010, 05/16/2011, 04/16/2012, 05/15/2013  . Influenza Whole 04/11/2008, 03/15/2010  . Influenza, High Dose Seasonal PF 05/27/2017, 04/24/2018  . Influenza,inj,Quad PF,6+ Mos 04/06/2014, 03/28/2015, 03/15/2016  . Influenza-Unspecified 04/13/2013  . Moderna Sars-Covid-2 Vaccination 09/09/2019, 10/07/2019, 05/09/2020  . Pneumococcal Conjugate-13 11/12/2013  . Pneumococcal Polysaccharide-23 07/15/1993, 07/15/2004, 07/23/2011  . Td 01/12/1998, 07/17/2007  . Tdap 11/21/2017  . Zoster 06/25/2016    TDAP status: Up to date  Flu Vaccine status: Up to date  Done 04/18/20 Pneumococcal vaccine status: Up to date  Covid-19 vaccine status: Completed vaccines  Qualifies for Shingles Vaccine? Yes   Zostavax completed Yes   Shingrix Completed?: No.    Education has been provided regarding the importance of this vaccine. Patient has been advised to call insurance company to determine out of pocket expense if they have not yet received this vaccine. Advised may also receive vaccine at local pharmacy or Health Dept. Verbalized acceptance and understanding.  Screening Tests Health Maintenance  Topic Date Due  . URINE MICROALBUMIN  03/18/2020  . HEMOGLOBIN A1C  10/17/2020  . COVID-19 Vaccine (4 - Booster for Moderna series) 11/07/2020  . OPHTHALMOLOGY EXAM  02/06/2021  . FOOT EXAM  04/18/2021  . TETANUS/TDAP  11/22/2027  . INFLUENZA VACCINE  Completed  . PNA vac Low Risk Adult  Completed  . MAMMOGRAM  Discontinued  . DEXA SCAN  Discontinued    Health Maintenance  Health Maintenance Due  Topic Date Due  .  URINE MICROALBUMIN  03/18/2020    Colorectal cancer screening: No longer required.   Mammogram status: No longer required due to age.  Additional Screening:   Vision Screening: Recommended annual ophthalmology exams for early detection of glaucoma and other disorders of the eye. Is the patient up to date with their annual eye exam?  Yes  Who is the provider or what is the name of the office in which the patient attends annual eye exams? [facility]   Dental Screening: Recommended annual dental exams for proper oral hygiene  Community Resource Referral / Chronic Care Management: CRR required this visit?  No   CCM required this visit?  No      Plan:     I have personally reviewed and noted the following in the patient's chart:   . Medical and social history . Use of alcohol, tobacco or illicit drugs  . Current medications and supplements . Functional ability and status . Nutritional status . Physical activity . Advanced directives . List of other physicians . Hospitalizations, surgeries, and ER visits in previous 12 months . Vitals . Screenings to include cognitive, depression, and falls . Referrals and appointments  In addition, I have reviewed and discussed with patient certain preventive protocols, quality metrics, and best practice recommendations. A written personalized care plan for preventive services as well as general preventive health recommendations were provided to patient.     Willette Brace, LPN   [id number]   Nurse Notes: none

## 2020-07-13 ENCOUNTER — Ambulatory Visit (INDEPENDENT_AMBULATORY_CARE_PROVIDER_SITE_OTHER): Payer: Medicare Other | Admitting: Neurology

## 2020-07-13 ENCOUNTER — Encounter: Payer: Self-pay | Admitting: Family Medicine

## 2020-07-13 ENCOUNTER — Encounter: Payer: Self-pay | Admitting: Neurology

## 2020-07-13 ENCOUNTER — Other Ambulatory Visit: Payer: Self-pay

## 2020-07-13 VITALS — BP 177/72 | HR 61 | Ht 60.0 in | Wt 127.4 lb

## 2020-07-13 DIAGNOSIS — F0390 Unspecified dementia without behavioral disturbance: Secondary | ICD-10-CM

## 2020-07-13 DIAGNOSIS — F03A0 Unspecified dementia, mild, without behavioral disturbance, psychotic disturbance, mood disturbance, and anxiety: Secondary | ICD-10-CM

## 2020-07-13 MED ORDER — DONEPEZIL HCL 10 MG PO TABS
ORAL_TABLET | ORAL | 3 refills | Status: DC
Start: 2020-07-13 — End: 2021-03-21

## 2020-07-13 NOTE — Patient Instructions (Signed)
Good to see you!  Continue Donepezil 10mg daily.  Continue close supervision, monitor driving.  Follow-up in 6-8 months, call for any changes.  FALL PRECAUTIONS: Be cautious when walking. Scan the area for obstacles that may increase the risk of trips and falls. When getting up in the mornings, sit up at the edge of the bed for a few minutes before getting out of bed. Consider elevating the bed at the head end to avoid drop of blood pressure when getting up. Walk always in a well-lit room (use night lights in the walls). Avoid area rugs or power cords from appliances in the middle of the walkways. Use a walker or a cane if necessary and consider physical therapy for balance exercise. Get your eyesight checked regularly.  FINANCIAL OVERSIGHT: Supervision, especially oversight when making financial decisions or transactions is also recommended.  HOME SAFETY: Consider the safety of the kitchen when operating appliances like stoves, microwave oven, and blender. Consider having supervision and share cooking responsibilities until no longer able to participate in those. Accidents with firearms and other hazards in the house should be identified and addressed as well.  DRIVING: Regarding driving, in patients with progressive memory problems, driving will be impaired. We advise to have someone else do the driving if trouble finding directions or if minor accidents are reported. Independent driving assessment is available to determine safety of driving.  ABILITY TO BE LEFT ALONE: If patient is unable to contact 911 operator, consider using LifeLine, or when the need is there, arrange for someone to stay with patients. Smoking is a fire hazard, consider supervision or cessation. Risk of wandering should be assessed by caregiver and if detected at any point, supervision and safe proof recommendations should be instituted.  MEDICATION SUPERVISION: Inability to self-administer medication needs to be constantly  addressed. Implement a mechanism to ensure safe administration of the medications.  RECOMMENDATIONS FOR ALL PATIENTS WITH MEMORY PROBLEMS: 1. Continue to exercise (Recommend 30 minutes of walking everyday, or 3 hours every week) 2. Increase social interactions - continue going to Church and enjoy social gatherings with friends and family 3. Eat healthy, avoid fried foods and eat more fruits and vegetables 4. Maintain adequate blood pressure, blood sugar, and blood cholesterol level. Reducing the risk of stroke and cardiovascular disease also helps promoting better memory. 5. Avoid stressful situations. Live a simple life and avoid aggravations. Organize your time and prepare for the next day in anticipation. 6. Sleep well, avoid any interruptions of sleep and avoid any distractions in the bedroom that may interfere with adequate sleep quality 7. Avoid sugar, avoid sweets as there is a strong link between excessive sugar intake, diabetes, and cognitive impairment We discussed the Mediterranean diet, which has been shown to help patients reduce the risk of progressive memory disorders and reduces cardiovascular risk. This includes eating fish, eat fruits and green leafy vegetables, nuts like almonds and hazelnuts, walnuts, and also use olive oil. Avoid fast foods and fried foods as much as possible. Avoid sweets and sugar as sugar use has been linked to worsening of memory function.  There is always a concern of gradual progression of memory problems. If this is the case, then we may need to adjust level of care according to patient needs. Support, both to the patient and caregiver, should then be put into place.  

## 2020-07-13 NOTE — Progress Notes (Signed)
NEUROLOGY FOLLOW UP OFFICE NOTE  Tammy Hughes [id number] [birth year]  HISTORY OF PRESENT ILLNESS: I had the pleasure of seeing Tammy Hughes in follow-up in the neurology clinic on 07/13/2020.  The patient was last seen 6 months ago for mild dementia, likely due to Alzheimer's disease. She is again accompanied by [name] Hughes who helps supplement the history today.  Records and images were personally reviewed where available.  MRI brain done in June 2021 did not show any acute changes. SLUMS score 10/30 in 12/2019. She was started on Donepezil 40m daily which she is tolerating without side effects. [name] feels she is doing a lot better since her last visit. She has been cooking a lot with no significant issues. [name] fixes her pillbox, then she takes her medications by herself and does not forget them. She drives minimally 1-2 times a week with no difficulties. She is independent with dressing and bathing. She had some trouble sleeping, she takes mirtazapine 145mqhs, but felt better with addition of melatonin recently. A glass of wine also helps her sleep. No personality changes, paranoia or hallucinations. No falls.    History on Initial Assessment 12/28/2019: This is an 86 year old right-handed woman with a history of hypertension, hyperlipidemia, diabetes, stomach cancer, anxiety, depression, presenting for evaluation of memory loss. [name] Hughes present to provide additional information. She states "I just can't remember." [name] started noticing changes around 4 years ago after she underwent chemotherapy and radiation treatment, worse in the past year. She repeats herself several times. She lives with her husband. She was diagnosed with cancer 4 years ago and was forgetting bill payments, her husband took over at that time. She denies getting lost driving. She forgets her medications, they had to count her pills and find that she has not taken them as instructed. She denies leaving the  stove on but [name] reports she has burned food, thinking she turned the burner off. She denies misplacing things. She is independent with dressing and bathing. Her 2 sisters and brother had Alzheimer's disease. No concussions or alcohol use. MMSE 24/30 in PCP office last 09/2019.  She has infrequent headaches, occasional dizziness/vertigo, low back pain, urinary incontinence. She denies any diplopia, dysarthria/dysphagia, focal numbness/tingling/weakness, tremors, or anosmia. No falls. Sleep is not good sometimes, she occasionally takes naps. No wandering behavior. Her daughter notes she is a little more irritable than before, she does not like a lot of people around. No hallucinations or paranoia.   Laboratory Data: Lab Results  Component Value Date   TSH 0.69 06/21/2020   Lab Results  Component Value Date   VITAMINB12 1,317 (H) 07/05/2019    PAST MEDICAL HISTORY: Past Medical History:  Diagnosis Date  . Allergy    SEASONAL  . ANXIETY 03/08/2008  . Cataract    REMOVED BILATERAL  . Depression   . DIVERTICULOSIS, COLON 03/08/2008  . [place]  . GERD 02/23/2007  . HYPERCHOLESTEROLEMIA 02/01/2008  . HYPERTENSION 03/08/2008  . Insulin dependent diabetes mellitus   . OSTEOARTHRITIS 02/23/2007  . OSTEOPOROSIS 02/23/2007  . PERIPHERAL NEUROPATHY 02/23/2007  . STD (sexually transmitted disease)   . Stomach cancer ([facility]   invasive adenocarcinoma w/signet rings features  . Urinary incontinence   . Urolithiasis     MEDICATIONS: Current Outpatient Medications on File Prior to Visit  Medication Sig Dispense Refill  . Acetaminophen (TYLENOL PO) Take 500 mg by mouth 2 (two) times daily.     .Marland Kitchen  calcium carbonate (TUMS - DOSED IN MG ELEMENTAL CALCIUM) 500 MG chewable tablet Chew 1 tablet by mouth daily.     . cholecalciferol (VITAMIN D3) 25 MCG (1000 UT) tablet Take 1,000 Units by mouth daily.    . clopidogrel (PLAVIX) 75 MG tablet Take 1 tablet (75 mg total) by  mouth daily. 90 tablet 2  . esomeprazole (NEXIUM) 40 MG capsule Take one pill daily for stomach 90 capsule 3  . insulin aspart protamine - aspart (NOVOLOG MIX 70/30 FLEXPEN) (70-30) 100 UNIT/ML FlexPen Inject 0.12 mLs (12 Units total) into the skin 2 (two) times daily with a meal. and pen needles 2/day 10 pen 3  . meclizine (ANTIVERT) 25 MG tablet TAKE 1 TABLET BY MOUTH THREE TIMES DAILY IF NEEDED FOR DIZZINESS 30 tablet 0  . melatonin 3 MG TABS tablet Take 3 mg by mouth at bedtime.    . mirtazapine (REMERON) 15 MG tablet TAKE 1 TABLET BY MOUTH EACH NIGHT AT BEDTIME AS DIRECTED 90 tablet 3  . ONETOUCH VERIO test strip USE 1 STRIP TO CHECK GLUCOSE TWICE DAILY 200 each 0  . rosuvastatin (CRESTOR) 5 MG tablet Take 1 tablet (5 mg total) by mouth daily. 90 tablet 3   No current facility-administered medications on file prior to visit.    ALLERGIES: Allergies  Allergen Reactions  . Pirfenidone Diarrhea and Nausea And Vomiting  . Aspirin     rash    FAMILY HISTORY: Family History  Problem Relation Age of Onset  . Cancer Father        Prostate Cancer  . Cancer Sister        Breast Cancer  . Diabetes Sister   . Diabetes Sister   . Other Mother        pneumonia   . Cancer Sister        breast cancer     SOCIAL HISTORY: Social History   Socioeconomic History  . Marital status: Married    Spouse name: Not on file  . Number of children: Not on file  . Years of education: Not on file  . Highest education level: Not on file  Occupational History  . Occupation: Retired    Fish farm manager: RETIRED  Tobacco Use  . Smoking status: Never Smoker  . Smokeless tobacco: Never Used  Vaping Use  . Vaping Use: Never used  Substance and Sexual Activity  . Alcohol use: No    Alcohol/week: 0.0 standard drinks  . Drug use: No  . Sexual activity: Not Currently    Birth control/protection: Post-menopausal  Other Topics Concern  . Not on file  Social History Narrative   Retired-Domestic Assistant    Right handed    Lives with husband    Social Determinants of Health   Financial Resource Strain: Low Risk   . Difficulty of Paying Living Expenses: Not hard at all  Food Insecurity: No Food Insecurity  . Worried About Charity fundraiser in the Last Year: Never true  . Ran Out of Food in the Last Year: Never true  Transportation Needs: No Transportation Needs  . Lack of Transportation (Medical): No  . Lack of Transportation (Non-Medical): No  Physical Activity: Inactive  . Days of Exercise per Week: 0 days  . Minutes of Exercise per Session: 0 min  Stress: No Stress Concern Present  . Feeling of Stress : Not at all  Social Connections: Moderately Integrated  . Frequency of Communication with Friends and Family: More than three times a  week  . Frequency of Social Gatherings with Friends and Family: More than three times a week  . Attends Religious Services: More than 4 times per year  . Active Member of Clubs or Organizations: No  . Attends Archivist Meetings: Never  . Marital Status: Married  Human resources officer Violence: Not At Risk  . Fear of Current or Ex-Partner: No  . Emotionally Abused: No  . Physically Abused: No  . Sexually Abused: No     PHYSICAL EXAM: Vitals:   07/13/20 1135  BP: (!) 177/72  Pulse: 61  SpO2: 96%   General: No acute distress Head:  Normocephalic/atraumatic Skin/Extremities: No rash, no edema Neurological Exam: alert and oriented to person, place, and month/year. No aphasia or dysarthria. Fund of knowledge is appropriate.  Recent and remote memory are impaired.  Attention and concentration are reduced, unable to spell WORLD backwards. MMSE 21/30.  MMSE - Mini Mental State Exam 07/13/2020 09/20/2019  Orientation to time 4 5  Orientation to Place 5 4  Registration 3 3  Attention/ Calculation 0 3  Recall 2 2  Language- name 2 objects 2 2  Language- repeat 0 0  Language- follow 3 step command 3 2  Language- read & follow direction 1 1   Write a sentence 1 1  Copy design 0 1  Total score 21 24     Cranial nerves: Pupils equal, round. Extraocular movements intact with no nystagmus. Visual fields full.  No facial asymmetry.  Motor: Bulk and tone normal, muscle strength 5/5 throughout with no pronator drift.   Finger to nose testing intact.  Gait narrow-based, slightly favoring right leg reporting right knee pain. No ataxia.    IMPRESSION: This is an 86 yo RH woman with a history of hypertension, hyperlipidemia, diabetes, stomach cancer, anxiety, depression, with mild dementia, likely due to Alzheimer's disease. MRI brain no acute changes. MMSE today 21/30. Her daughter reports improvement since last visit, continue Donepezil 7m daily. Continue close supervision, monitor driving. Continue control of vascular risk factors, we discussed the importance of physical and brain stimulation exercises for brain health. Follow-up in 6-8 months, they know to call for any changes.    Thank you for allowing me to participate in her care.  Please do not hesitate to call for any questions or concerns.   KEllouise Newer M.D.   CC: Dr. WRogers Blocker

## 2020-08-07 ENCOUNTER — Encounter: Payer: Self-pay | Admitting: Family Medicine

## 2020-08-07 ENCOUNTER — Other Ambulatory Visit: Payer: Self-pay

## 2020-08-07 ENCOUNTER — Telehealth: Payer: Self-pay | Admitting: Family Medicine

## 2020-08-07 ENCOUNTER — Telehealth: Payer: Medicare Other | Admitting: Family Medicine

## 2020-08-07 VITALS — Ht 60.0 in | Wt 127.4 lb

## 2020-08-07 NOTE — Progress Notes (Signed)
Patient: Tammy Hughes MRN: [id number] DOB: Apr 28, 1933 PCP: Orma Flaming, MD     I connected with Dorris Fetch on 08/07/20 at 11:10am by a video enabled telemedicine application and verified that I am speaking with the correct person using two identifiers.  Location patient: Home Location provider: [facility], Office Persons participating in this virtual visit: Catera Hankins and Dr. Rogers Blocker   I discussed the limitations of evaluation and management by telemedicine and the availability of in person appointments. The patient expressed understanding and agreed to proceed.   Interactive audio and video telecommunications were attempted between this provider and patient, however failed, due to patient having technical difficulties OR patient did not have access to video capability.  We continued and completed visit with audio only.    Subjective:  Chief Complaint  Patient presents with  . Sore Throat  . URI  . Generalized Body Aches    HPI: The patient is a 87 y.o. female who presents today for mild sore throat, headache that has subsided with taking Tylenol. She says that she still has body aches in legs and back. She says that she has rubbed down in lenoment for aches and pain.   Patient never connected to video. I personally called phones 3x and no one answered. My cma called twice and no answer. Will need to reschedule.    She has been triple vaccinated for covid.   Review of Systems  Constitutional: Negative for fever.  HENT: Positive for sore throat.   Respiratory: Negative for chest tightness and shortness of breath.   Neurological: Negative for headaches.    Allergies Patient is allergic to pirfenidone and aspirin.  Past Medical History Patient  has a past medical history of Allergy, ANXIETY (03/08/2008), Cataract, Depression, DIVERTICULOSIS, COLON (03/08/2008), DVT (deep venous thrombosis) ([place]), GERD (02/23/2007), HYPERCHOLESTEROLEMIA (02/01/2008), HYPERTENSION  (03/08/2008), Insulin dependent diabetes mellitus, OSTEOARTHRITIS (02/23/2007), OSTEOPOROSIS (02/23/2007), PERIPHERAL NEUROPATHY (02/23/2007), STD (sexually transmitted disease), Stomach cancer ([place]) (07/20/14), Urinary incontinence, and Urolithiasis.  Surgical History Patient  has a past surgical history that includes Cholecystectomy (1995); Esophagogastroduodenoscopy (06/08/2002); Gastrectomy (N/A, 07/20/2014); laparoscopy (N/A, 07/20/2014); Gastrojejunostomy (N/A, 07/20/2014); Cardiac catheterization (N/A, 02/02/2016); and Abdominal surgery.  Family History Pateint's family history includes Cancer in her father, sister, and sister; Diabetes in her sister and sister; Other in her mother.  Social History Patient  reports that she has never smoked. She has never used smokeless tobacco. She reports that she does not drink alcohol and does not use drugs.    Objective: Vitals:   08/07/20 1051  Weight: 127 lb 6.8 oz (57.8 kg)  Height: 5' (1.524 m)    Body mass index is 24.89 kg/m.  Physical Exam     Assessment/plan:      No follow-ups on file.     Orma Flaming, MD [place]  08/07/2020

## 2020-08-07 NOTE — Telephone Encounter (Signed)
Please let patient know she never connected to video and then I personally callled 3x with no answer and my cma called 2x with no answer. We can get her rescheduled no problem.   Thanks!  Orma Flaming, MD [place]

## 2020-08-11 DIAGNOSIS — Z20822 Contact with and (suspected) exposure to covid-19: Secondary | ICD-10-CM | POA: Diagnosis not present

## 2020-08-11 NOTE — Telephone Encounter (Signed)
I have offered patient virtual visit for Sat.  Patient wanted to wait until next week to make virtual appt.  I gave patient a few times but she was not sure she would be available.  Patient has decided to wait until next week to call back to schedule appt.

## 2020-08-21 ENCOUNTER — Other Ambulatory Visit: Payer: Self-pay

## 2020-08-23 ENCOUNTER — Telehealth: Payer: Self-pay

## 2020-08-23 ENCOUNTER — Telehealth (INDEPENDENT_AMBULATORY_CARE_PROVIDER_SITE_OTHER): Payer: Medicare Other | Admitting: Physician Assistant

## 2020-08-23 ENCOUNTER — Ambulatory Visit: Payer: Medicare Other | Admitting: Endocrinology

## 2020-08-23 ENCOUNTER — Encounter: Payer: Self-pay | Admitting: Physician Assistant

## 2020-08-23 ENCOUNTER — Other Ambulatory Visit: Payer: Self-pay | Admitting: Physician Assistant

## 2020-08-23 ENCOUNTER — Other Ambulatory Visit: Payer: Self-pay

## 2020-08-23 DIAGNOSIS — B9689 Other specified bacterial agents as the cause of diseases classified elsewhere: Secondary | ICD-10-CM

## 2020-08-23 DIAGNOSIS — J208 Acute bronchitis due to other specified organisms: Secondary | ICD-10-CM | POA: Diagnosis not present

## 2020-08-23 MED ORDER — AMOXICILLIN-POT CLAVULANATE 875-125 MG PO TABS: 1.0000 | ORAL_TABLET | Freq: Two times a day (BID) | ORAL | 0 refills | Status: DC

## 2020-08-23 MED ORDER — BENZONATATE 100 MG PO CAPS: 100.0000 mg | ORAL_CAPSULE | Freq: Two times a day (BID) | ORAL | 0 refills | Status: DC | PRN

## 2020-08-23 MED ORDER — CEFPROZIL 500 MG PO TABS: 500.0000 mg | ORAL_TABLET | Freq: Two times a day (BID) | ORAL | 0 refills | Status: AC

## 2020-08-23 NOTE — Patient Instructions (Signed)

## 2020-08-23 NOTE — Telephone Encounter (Signed)
Augmentin sent in its place, thanks! Be sure to take this with food.

## 2020-08-23 NOTE — Progress Notes (Signed)
Virtual Visit via Telephone Note  I connected with Tammy Hughes on 08/23/20 at 10:00 AM EST by telephone and verified that I am speaking with the correct person using two identifiers.  Location: Patient: home Provider: [place] Persons present: Patient, daughter, patient's husband all on phone   I discussed the limitations, risks, security and privacy concerns of performing an evaluation and management service by telephone and the availability of in person appointments. I also discussed with the patient that there may be a patient responsible charge related to this service. The patient expressed understanding and agreed to proceed.   History of Present Illness: Chief complaint: Cough  Symptom onset: At least 2 weeks ago, worse 3 days ago on 08/20/2020  Pertinent positives: Productive cough, some yellow, nasal congestion. Cough worse at night.  Pertinent negatives: SOB, wheezing, chest pain, fever, weakness, confusion Treatments tried: Nyquil, Tylenol Vaccine status: All vaccines plus booster Sick exposure: No sick contacts Week of Jan 24th had a COVID test through the drug store, negative   Observations/Objective:  Temp 97.6 F No other vitals able to be obtained today  Overall well sounding on the phone.  No shortness of breath or signs of respiratory distress.  She was able to converse with me normally without pauses or gasps.  No active cough during phone visit.  Assessment and Plan: 1. Acute bacterial bronchitis I will treat her for possible bacterial infection at this time as her symptoms have been going on more than 10 to 14 days.  Her Covid test was negative and I do not feel it necessary for her to retest at this time as she has not had any change in symptoms.  She will take the cefprozil 500 mg 1 tablet twice daily for 7 days.  She will continue to push fluids.  She will try guaifenesin to help with the mucus.  Rx for [employer] twice daily to help  control some of the coughing fits.  Cautioned her against use of NyQuil especially at her age.  She will seek urgent care or emergency department if her symptoms suddenly worsen.   Follow Up Instructions:    I discussed the assessment and treatment plan with the patient. The patient was provided an opportunity to ask questions and all were answered. The patient agreed with the plan and demonstrated an understanding of the instructions.   The patient was advised to call back or seek an in-person evaluation if the symptoms worsen or if the condition fails to improve as anticipated.  I provided 15 minutes of non-face-to-face time during this encounter.   Zoran Yankee M Kalai Baca, PA-C

## 2020-08-23 NOTE — Telephone Encounter (Signed)
Pharmacy states above medication is not covered, nor do they have any in stock. Wanting to know if another medication can be sent   [id number]

## 2020-08-23 NOTE — Progress Notes (Signed)
I have discussed the procedure for the virtual visit with the patient who has given consent to proceed with assessment and treatment.   Tammy Hughes S Tammy Hughes, CMA     

## 2020-08-24 ENCOUNTER — Ambulatory Visit: Payer: BC Managed Care – PPO | Admitting: Oncology

## 2020-09-06 ENCOUNTER — Telehealth: Payer: Self-pay

## 2020-09-06 NOTE — Telephone Encounter (Signed)
Nurse Assessment Nurse: Thad Ranger RN, Denise Date/Time (Eastern Time): 09/05/2020 12:28:40 PM Confirm and document reason for call. If symptomatic, describe symptoms. ---Caller states pt is experiencing black stool and diarrhea. She is taking Pepto Bismol and Kaopectate. She has had 2 diarrhea stools today. Does the patient have any new or worsening symptoms? ---Yes Will a triage be completed? ---Yes Related visit to physician within the last 2 weeks? ---No Does the PT have any chronic conditions? (i.e. diabetes, asthma, this includes High risk factors for pregnancy, etc.) ---No Is this a behavioral health or substance abuse call? ---No Guidelines Guideline Title Affirmed Question Affirmed Notes Nurse Date/Time Eilene Ghazi Time) Rectal Bleeding Age > 51 years Thad Ranger, Solicitor 09/05/2020 12:29:48 PM Disp. Time Eilene Ghazi Time) Disposition Final User 09/05/2020 12:34:45 PM See PCP within 2 Weeks Yes Carmon, RN, Yevette Edwards Disagree/Comply Comply Caller Understands Yes PreDisposition Call Doctor PLEASE NOTE: All timestamps contained within this report are represented as [place] Standard Time. CONFIDENTIALTY NOTICE: This fax transmission is intended only for the addressee. It contains information that is legally privileged, confidential or otherwise protected from use or disclosure. If you are not the intended recipient, you are strictly prohibited from reviewing, disclosing, copying using or disseminating any of this information or taking any action in reliance on or regarding this information. If you have received this fax in error, please notify us immediately by telephone so that we can arrange for its return to [place]. Phone: [phone], Toll-Free: [phone], Fax: [phone] Page: 2 of 2 Call Id: [id number] Care Advice Given Per Guideline SEE PCP WITHIN 2 WEEKS: CALL BACK IF: * You become worse CARE ADVICE given per Rectal Bleeding (Adult) guideline. Comments User: Romeo Apple, RN  Date/Time Eilene Ghazi Time): 09/05/2020 12:34:32 PM Advised to stop the Pepto Bismol and Kaopectate as it can cause the stool to be black. Advised to push fluids, stay on soft bland diet and if med needed for diarrhea, may take Immodium AD. Advised med is not needed for 2 diarrhea stools/day. Referrals REFERRED TO PCP OFFIC

## 2020-09-06 NOTE — Telephone Encounter (Signed)
Patient is scheduled for tomorrow.

## 2020-09-06 NOTE — Telephone Encounter (Signed)
If stool continues to be black we need to see her. Can put in acute spot.  Orma Flaming, MD [place]

## 2020-09-07 ENCOUNTER — Encounter: Payer: Self-pay | Admitting: Family Medicine

## 2020-09-07 ENCOUNTER — Other Ambulatory Visit: Payer: Self-pay

## 2020-09-07 ENCOUNTER — Ambulatory Visit (INDEPENDENT_AMBULATORY_CARE_PROVIDER_SITE_OTHER): Payer: Medicare Other | Admitting: Family Medicine

## 2020-09-07 VITALS — BP 132/62 | HR 57 | Temp 98.0°F | Ht 60.0 in | Wt 125.4 lb

## 2020-09-07 DIAGNOSIS — K521 Toxic gastroenteritis and colitis: Secondary | ICD-10-CM

## 2020-09-07 LAB — CBC WITH DIFFERENTIAL/PLATELET
Basophils Absolute: 0 10*3/uL (ref 0.0–0.1)
Basophils Relative: 0.5 % (ref 0.0–3.0)
Eosinophils Absolute: 0.2 10*3/uL (ref 0.0–0.7)
Eosinophils Relative: 3.5 % (ref 0.0–5.0)
HCT: 36.2 % (ref 36.0–46.0)
Hemoglobin: 12 g/dL (ref 12.0–15.0)
Lymphocytes Relative: 38.6 % (ref 12.0–46.0)
Lymphs Abs: 2 10*3/uL (ref 0.7–4.0)
MCHC: 33.2 g/dL (ref 30.0–36.0)
MCV: 90.5 fl (ref 78.0–100.0)
Monocytes Absolute: 0.5 10*3/uL (ref 0.1–1.0)
Monocytes Relative: 9.3 % (ref 3.0–12.0)
Neutro Abs: 2.5 10*3/uL (ref 1.4–7.7)
Neutrophils Relative %: 48.1 % (ref 43.0–77.0)
Platelets: 271 10*3/uL (ref 150.0–400.0)
RBC: 4 Mil/uL (ref 3.87–5.11)
RDW: 13.3 % (ref 11.5–15.5)
WBC: 5.3 10*3/uL (ref 4.0–10.5)

## 2020-09-07 LAB — COMPREHENSIVE METABOLIC PANEL
ALT: 33 U/L (ref 0–35)
AST: 32 U/L (ref 0–37)
Albumin: 3.8 g/dL (ref 3.5–5.2)
Alkaline Phosphatase: 71 U/L (ref 39–117)
BUN: 13 mg/dL (ref 6–23)
CO2: 31 mEq/L (ref 19–32)
Calcium: 10.5 mg/dL (ref 8.4–10.5)
Chloride: 104 mEq/L (ref 96–112)
Creatinine, Ser: 0.93 mg/dL (ref 0.40–1.20)
GFR: 56.09 mL/min — ABNORMAL LOW (ref >60.00)
Glucose, Bld: 81 mg/dL (ref 70–99)
Potassium: 4.3 mEq/L (ref 3.5–5.1)
Sodium: 140 mEq/L (ref 135–145)
Total Bilirubin: 0.3 mg/dL (ref 0.2–1.2)
Total Protein: 7.9 g/dL (ref 6.0–8.3)

## 2020-09-07 NOTE — Patient Instructions (Signed)
I think diarrhea and color change was due to antibiotic and pepto bismol. Exam normal and will check labs to reassure.   Let me know if dark stools occur again! Good seeing you! Dr. Rogers Blocker

## 2020-09-07 NOTE — Progress Notes (Signed)
Patient: Tammy Hughes MRN: [id number] DOB: [birth year] PCP: Orma Flaming, MD     Subjective:  Chief Complaint  Patient presents with  . Diarrhea    With black stools.    HPI: The patient is a 87 y.o. female who presents today for diarrhea and black stools. She says that this happened last week, she is no longer having this issue. She was actually given an antibiotic before which could have been culprit for diarrhea and color change of stool.  Diarrhea started after she started taking abx and she would have 2-3 episodes /day. She only had black/dark stools x 2 days after taking pepto bismol. She also took pepto bismol before her bm that also likely cause stool change. She is no longer having any diarrhea since yesterday. She had 2 episodes and they were not black at all. She denies any blood in her stool. She was tested for covid when she was sick and it was negative.   Review of Systems  Constitutional: Negative for chills, fatigue and fever.  HENT: Negative for dental problem, ear pain, hearing loss and trouble swallowing.   Eyes: Negative for visual disturbance.  Respiratory: Negative for cough, chest tightness and shortness of breath.   Cardiovascular: Negative for chest pain, palpitations and leg swelling.  Gastrointestinal: Negative for abdominal pain, blood in stool, diarrhea, nausea and vomiting.  Endocrine: Negative for cold intolerance, polydipsia, polyphagia and polyuria.  Genitourinary: Negative for dysuria and hematuria.  Musculoskeletal: Negative for arthralgias.  Skin: Negative for rash.  Neurological: Negative for dizziness and headaches.  Psychiatric/Behavioral: Negative for dysphoric mood and sleep disturbance. The patient is not nervous/anxious.     Allergies Patient is allergic to pirfenidone and aspirin.  Past Medical History Patient  has a past medical history of Allergy, ANXIETY (03/08/2008), Cataract, Depression, DIVERTICULOSIS, COLON (03/08/2008), DVT (deep  venous thrombosis) ([place]), GERD (02/23/2007), HYPERCHOLESTEROLEMIA (02/01/2008), HYPERTENSION (03/08/2008), Insulin dependent diabetes mellitus, OSTEOARTHRITIS (02/23/2007), OSTEOPOROSIS (02/23/2007), PERIPHERAL NEUROPATHY (02/23/2007), STD (sexually transmitted disease), Stomach cancer ([place]) (07/20/14), Urinary incontinence, and Urolithiasis.  Surgical History Patient  has a past surgical history that includes Cholecystectomy (1995); Esophagogastroduodenoscopy (06/08/2002); Gastrectomy (N/A, 07/20/2014); laparoscopy (N/A, 07/20/2014); Gastrojejunostomy (N/A, 07/20/2014); Cardiac catheterization (N/A, 02/02/2016); and Abdominal surgery.  Family History Pateint's family history includes Cancer in her father, sister, and sister; Diabetes in her sister and sister; Other in her mother.  Social History Patient  reports that she has never smoked. She has never used smokeless tobacco. She reports that she does not drink alcohol and does not use drugs.    Objective: Vitals:   09/07/20 1129  BP: 132/62  Pulse: (!) 57  Temp: 98 F (36.7 C)  TempSrc: Temporal  SpO2: 98%  Weight: 125 lb 6.4 oz (56.9 kg)  Height: 5' (1.524 m)    Body mass index is 24.49 kg/m.  Physical Exam Vitals reviewed.  Constitutional:      Appearance: Normal appearance. She is normal weight.  HENT:     Head: Normocephalic and atraumatic.  Cardiovascular:     Rate and Rhythm: Normal rate and regular rhythm.     Heart sounds: Normal heart sounds.  Pulmonary:     Effort: Pulmonary effort is normal.     Breath sounds: Normal breath sounds.  Abdominal:     General: Abdomen is flat. Bowel sounds are normal.     Palpations: Abdomen is soft.     Tenderness: There is no abdominal tenderness.  Neurological:     General: No focal deficit present.  Mental Status: She is alert.     Comments: Memory loss, history is difficult         Assessment/plan: 1. Diarrhea due to drug Diarrhea likely due to antibiotic and stool color change  from abx vs. Pepto. All have resolved at this point. She has no stomach pain. No nausea or vomiting. Will check a cbc/cmp to make sure no increased bun/anemia for bleed, but do think this is likely secondary to medication. Reassurance given as well as precautions.  - CBC with Differential/Platelet - Comprehensive metabolic panel   This visit occurred during the SARS-CoV-2 public health emergency.  Safety protocols were in place, including screening questions prior to the visit, additional usage of staff PPE, and extensive cleaning of exam room while observing appropriate contact time as indicated for disinfecting solutions.     Return if symptoms worsen or fail to improve.   Orma Flaming, MD [place]   09/07/2020

## 2020-09-18 ENCOUNTER — Encounter: Payer: Self-pay | Admitting: Family Medicine

## 2020-09-19 ENCOUNTER — Other Ambulatory Visit: Payer: Self-pay

## 2020-09-19 ENCOUNTER — Telehealth: Payer: Self-pay | Admitting: Oncology

## 2020-09-19 ENCOUNTER — Inpatient Hospital Stay: Payer: BC Managed Care – PPO | Attending: Oncology | Admitting: Oncology

## 2020-09-19 VITALS — BP 171/85 | HR 57 | Temp 97.6°F | Resp 18 | Ht 60.0 in | Wt 128.4 lb

## 2020-09-19 DIAGNOSIS — Z85028 Personal history of other malignant neoplasm of stomach: Secondary | ICD-10-CM | POA: Insufficient documentation

## 2020-09-19 DIAGNOSIS — E119 Type 2 diabetes mellitus without complications: Secondary | ICD-10-CM | POA: Diagnosis not present

## 2020-09-19 DIAGNOSIS — Z86718 Personal history of other venous thrombosis and embolism: Secondary | ICD-10-CM | POA: Diagnosis not present

## 2020-09-19 DIAGNOSIS — Z923 Personal history of irradiation: Secondary | ICD-10-CM | POA: Diagnosis not present

## 2020-09-19 DIAGNOSIS — F32A Depression, unspecified: Secondary | ICD-10-CM | POA: Insufficient documentation

## 2020-09-19 DIAGNOSIS — C169 Malignant neoplasm of stomach, unspecified: Secondary | ICD-10-CM | POA: Diagnosis not present

## 2020-09-19 DIAGNOSIS — Z9221 Personal history of antineoplastic chemotherapy: Secondary | ICD-10-CM | POA: Diagnosis not present

## 2020-09-19 DIAGNOSIS — J8410 Pulmonary fibrosis, unspecified: Secondary | ICD-10-CM | POA: Insufficient documentation

## 2020-09-19 DIAGNOSIS — I252 Old myocardial infarction: Secondary | ICD-10-CM | POA: Insufficient documentation

## 2020-09-19 NOTE — Progress Notes (Signed)
  [facility] OFFICE PROGRESS NOTE   Diagnosis: Gastric cancer  INTERVAL HISTORY:   Tammy Hughes returns as scheduled.  She feels well.  Good appetite.  No nausea.  No symptoms of recurrent venous thrombosis.  No complaint.  Objective:  Vital signs in last 24 hours:  Blood pressure (!) 171/85, pulse (!) 57, temperature 97.6 F (36.4 C), temperature source Tympanic, resp. rate 18, height 5' (1.524 m), weight 128 lb 6.4 oz (58.2 kg), SpO2 98 %.    Lymphatics: No cervical, supraclavicular, axillary, or inguinal nodes Resp: Diffuse inspiratory rales, no respiratory distress Cardio: Regular rate and rhythm GI: No mass, no hepatosplenomegaly, no apparent ascites, nontender Vascular: No leg edema   Lab Results:  Lab Results  Component Value Date   WBC 5.3 09/07/2020   HGB 12.0 09/07/2020   HCT 36.2 09/07/2020   MCV 90.5 09/07/2020   PLT 271.0 09/07/2020   NEUTROABS 2.5 09/07/2020    CMP  Lab Results  Component Value Date   NA 140 09/07/2020   K 4.3 09/07/2020   CL 104 09/07/2020   CO2 31 09/07/2020   GLUCOSE 81 09/07/2020   BUN 13 09/07/2020   CREATININE 0.93 09/07/2020   CALCIUM 10.5 09/07/2020   PROT 7.9 09/07/2020   ALBUMIN 3.8 09/07/2020   AST 32 09/07/2020   ALT 33 09/07/2020   ALKPHOS 71 09/07/2020   BILITOT 0.3 09/07/2020   GFRNONAA 58 (L) 03/22/2020   GFRAA 67 03/22/2020     Medications: I have reviewed the patient's current medications.   Assessment/Plan: 1. Gastric cancer, status post an endoscopic biopsy of a lesser curvature mass on 05/31/2014 confirming adenocarcinoma ? Staging CTs of the chest and abdomen on 06/08/2014 revealed no evidence of metastatic disease ? Proximal gastrectomy and feeding jejunostomy 07/20/2014, pT2,pN1 ? Adjuvant weekly 5-Fu/leucovorin 09/14/2014, 09/21/2014, 09/28/2014 and 10/05/2014 ? Initiation of radiation and infusional 5-FU 10/17/2014 ? 5-fluorouracil pump discontinued on 10/27/2014 due to  toxicity. ? Radiation discontinued 11/02/2014  2. Pulmonary fibrosis 3. Esophageal stricture, status post a dilatation procedure 05/31/2014 4. Diabetes 5. Postoperative ventricle tachycardia, NSTEMI January 2016 6. Hospital-acquired pneumonia January 2016 7. CT abdomen/pelvis 09/02/2014 with postsurgical changes. Focal fluid collection in the midline abutting the distal greater curvature of the stomach measuring 2.2 x 2.9 x 3.4 cm. Mild stranding in the adjacent fat. 8. PICC placement 10/17/2014 9. Rash. Most likely related to the 5-fluorouracil. Resolved. 10. Right upper extremity DVT 10/25/2014. She completed a course of Xarelto. 11. Hospitalization 10/27/2014 through 10/28/2014 nausea/vomiting and left arm discomfort. Noted to have skin toxicity from the 5-fluorouracil. The 5-fluorouracil was discontinued. 12. Depression. She continues Remeron.  Improved.     Disposition: Tammy Hughes is in remission from gastric cancer.  She would like to continue follow-up at the Cancer center.  She will return for an office visit in 6 months.  [name], MD  09/19/2020  12:16 PM

## 2020-09-19 NOTE — Telephone Encounter (Signed)
Scheduled appointment per 3/8 los. Spoke to patient who is aware of appointment date and time.  

## 2020-09-20 ENCOUNTER — Other Ambulatory Visit: Payer: Self-pay

## 2020-09-20 MED ORDER — BENZONATATE 100 MG PO CAPS: 100.0000 mg | ORAL_CAPSULE | Freq: Two times a day (BID) | ORAL | 0 refills | Status: DC | PRN

## 2020-09-25 ENCOUNTER — Telehealth: Payer: Self-pay

## 2020-09-25 NOTE — Telephone Encounter (Signed)
Error

## 2020-09-27 ENCOUNTER — Encounter: Payer: Self-pay | Admitting: Family Medicine

## 2020-09-27 ENCOUNTER — Ambulatory Visit (INDEPENDENT_AMBULATORY_CARE_PROVIDER_SITE_OTHER): Payer: Medicare Other

## 2020-09-27 ENCOUNTER — Other Ambulatory Visit: Payer: Self-pay

## 2020-09-27 ENCOUNTER — Ambulatory Visit (INDEPENDENT_AMBULATORY_CARE_PROVIDER_SITE_OTHER): Payer: Medicare (Managed Care) | Admitting: Family Medicine

## 2020-09-27 VITALS — BP 148/74 | HR 66 | Temp 97.9°F | Ht 60.0 in | Wt 124.6 lb

## 2020-09-27 DIAGNOSIS — M25512 Pain in left shoulder: Secondary | ICD-10-CM | POA: Diagnosis not present

## 2020-09-27 DIAGNOSIS — R0781 Pleurodynia: Secondary | ICD-10-CM | POA: Diagnosis not present

## 2020-09-27 DIAGNOSIS — Z043 Encounter for examination and observation following other accident: Secondary | ICD-10-CM | POA: Diagnosis not present

## 2020-09-27 DIAGNOSIS — J8410 Pulmonary fibrosis, unspecified: Secondary | ICD-10-CM | POA: Diagnosis not present

## 2020-09-27 MED ORDER — ACETAMINOPHEN-CODEINE #3 300-30 MG PO TABS: 1.0000 | ORAL_TABLET | Freq: Four times a day (QID) | ORAL | 0 refills | Status: DC | PRN

## 2020-09-27 NOTE — Progress Notes (Addendum)
Patient: Tammy Hughes MRN: [id number] DOB: [birth year] PCP: Orma Flaming, MD     Subjective:  Chief Complaint  Patient presents with  . Shoulder Pain    Left; Pt had a fall 2 weeks ago. Shoulder pain started a week after. She has been taking Tylenol, but does not touch the pain.  Marland Kitchen Cough    HPI: The patient is a 87 y.o. female who presents today for shoulder pain due to an injury. Shoulder pain started a week after a fall. She also has pain in her left lateral rib area. She fell 2 weeks ago. She was walking down a ramp and stepped wrong and fell onto her right side. She woke up on Sunday with her left arm/shoulder hurting really bad and she couldn't move it. She is right handed. She does have ROM. She has takne tylenol for the pain and it hasn't really helped much. Pain is rated as a 4-5/10. Pain is dull. No radiation of pain. She has used a heating pad and it does make it feel better. She has no shortness of breath and no pain with deep inspiration. States it is better today than Sunday.   She also has had a cough, but she states it is getting better since taking her mucinex.    Review of Systems  Constitutional: Negative for fever.  Respiratory: Positive for cough. Negative for shortness of breath and wheezing.   Cardiovascular: Negative for chest pain, palpitations and leg swelling.  Gastrointestinal: Negative for abdominal pain.  Musculoskeletal: Positive for arthralgias. Negative for back pain, neck pain and neck stiffness.    Allergies Patient is allergic to pirfenidone and aspirin.  Past Medical History Patient  has a past medical history of Allergy, ANXIETY (03/08/2008), Cataract, Depression, DIVERTICULOSIS, COLON (03/08/2008), DVT (deep venous thrombosis) ([place]), GERD (02/23/2007), HYPERCHOLESTEROLEMIA (02/01/2008), HYPERTENSION (03/08/2008), Insulin dependent diabetes mellitus, OSTEOARTHRITIS (02/23/2007), OSTEOPOROSIS (02/23/2007), PERIPHERAL NEUROPATHY (02/23/2007), STD  (sexually transmitted disease), Stomach cancer ([place]) (07/20/14), Urinary incontinence, and Urolithiasis.  Surgical History Patient  has a past surgical history that includes Cholecystectomy (1995); Esophagogastroduodenoscopy (06/08/2002); Gastrectomy (N/A, 07/20/2014); laparoscopy (N/A, 07/20/2014); Gastrojejunostomy (N/A, 07/20/2014); Cardiac catheterization (N/A, 02/02/2016); and Abdominal surgery.  Family History Pateint's family history includes Cancer in her father, sister, and sister; Diabetes in her sister and sister; Other in her mother.  Social History Patient  reports that she has never smoked. She has never used smokeless tobacco. She reports that she does not drink alcohol and does not use drugs.    Objective: Vitals:   09/27/20 1144  BP: (!) 148/74  Pulse: 66  Temp: 97.9 F (36.6 C)  TempSrc: Temporal  SpO2: 95%  Weight: 124 lb 9.6 oz (56.5 kg)  Height: 5' (1.524 m)    Body mass index is 24.33 kg/m.  Physical Exam Vitals reviewed.  Constitutional:      Appearance: Normal appearance. She is normal weight.  HENT:     Head: Normocephalic and atraumatic.  Cardiovascular:     Rate and Rhythm: Normal rate and regular rhythm.     Heart sounds: Murmur heard.    Pulmonary:     Effort: Pulmonary effort is normal.     Comments: velcro sounds all through bases of lungs, adequate air movement in upper lungs. No wheezing/rales.  Abdominal:     General: Abdomen is flat. Bowel sounds are normal.     Palpations: Abdomen is soft.  Musculoskeletal:        General: Tenderness (mild TTP over lateral left rib cage  along T4-T9. no bruising. left shoulder: no bruising/erythema. TTP over trapezius. TTP over anterior humeral head. negative empty can test, pain with passive arc and can not go much over 90 degrees ) present.  Neurological:     Mental Status: She is alert. Mental status is at baseline.    Rib xray: no obvious fractures by my read Shoulder xray: no fractures.  Fibrotic  changes in lungs.  Official read pending.     Assessment/plan: 1. Acute pain of left shoulder Do not see obvious fracture. I wonder if she has more frozen shoulder. Sending to sports med for eval before ordering PT. Tylenol 3 sent in for severe pain as she seems uncomfortable today. Her husband is with her so I went over this with him as patient has memory loss.  - DG Shoulder Left; Future - Ambulatory referral to Sports Medicine  2. Rib pain on left side Do not see obvious fracture. Continue with heating pad/otc pain meds.  - DG Ribs Unilateral Left; Future  3. Pulmonary fibrosis Xray looks worse. Appears lost to f/u with ongoing cough. pulm referral done again.   This visit occurred during the SARS-CoV-2 public health emergency.  Safety protocols were in place, including screening questions prior to the visit, additional usage of staff PPE, and extensive cleaning of exam room while observing appropriate contact time as indicated for disinfecting solutions.      Return if symptoms worsen or fail to improve.   Orma Flaming, MD [facility]   09/27/2020

## 2020-09-27 NOTE — Patient Instructions (Signed)
-tylenol 3 sent in for severe pain. Has codeine in it so may make you drowsy  -sports med referral done for possible injection of shoulder. I wonder if you have frozen shoulder. PT helps this as well.   xrays look okay, but i'll let you know what radiologist says,   Dr. Hinton Lovely Capsulitis  Adhesive capsulitis, also called frozen shoulder, causes the shoulder to become stiff and painful to move. This condition happens when there is inflammation of the tendons and ligaments that surround the shoulder joint (shoulder capsule). What are the causes? This condition may be caused by:  An injury to your shoulder joint.  Straining your shoulder.  Not moving your shoulder for a period of time. This can happen if your arm was injured or in a sling.  Long-standing conditions, such as: ? Diabetes. ? Thyroid problems. ? Heart disease. ? Stroke. ? Rheumatoid arthritis. ? Lung disease. In some cases, the cause is not known. What increases the risk? You are more likely to develop this condition if you are:  A woman.  Older than 85 years of age. What are the signs or symptoms? Symptoms of this condition include:  Pain in your shoulder when you move your arm. There may also be pain when parts of your shoulder are touched. The pain may be worse at night or when you are resting.  A sore or aching shoulder.  The inability to move your shoulder normally.  Muscle spasms. How is this diagnosed? This condition is diagnosed with a physical exam and imaging tests, such as an X-ray or MRI. How is this treated? This condition may be treated with:  Treatment of the underlying cause or condition.  Medicine. Medicine may be given to relieve pain, inflammation, or muscle spasms.  Steroid injections into the shoulder joint.  Physical therapy. This involves performing exercises to get the shoulder moving again.  Acupuncture. This is a type of treatment that involves stimulating  specific points on your body by inserting thin needles through your skin.  Shoulder manipulation. This is a procedure to move the shoulder into another position. It is done after you are given a medicine to make you fall asleep (general anesthetic). The joint may also be injected with salt water at high pressure to break down scarring.  Surgery. This may be done in severe cases when other treatments have failed. Although most people recover completely from adhesive capsulitis, some may not regain full shoulder movement. Follow these instructions at home: Managing pain, stiffness, and swelling  If directed, put ice on the injured area: ? Put ice in a plastic bag. ? Place a towel between your skin and the bag. ? Leave the ice on for 20 minutes, 2-3 times per day.  If directed, apply heat to the affected area before you exercise. Use the heat source that your health care provider recommends, such as a moist heat pack or a heating pad. ? Place a towel between your skin and the heat source. ? Leave the heat on for 20-30 minutes. ? Remove the heat if your skin turns bright red. This is especially important if you are unable to feel pain, heat, or cold. You may have a greater risk of getting burned.      General instructions  Take over-the-counter and prescription medicines only as told by your health care provider.  If you are being treated with physical therapy, follow instructions from your physical therapist.  Avoid exercises that put  a lot of demand on your shoulder, such as throwing. These exercises can make pain worse.  Keep all follow-up visits as told by your health care provider. This is important. Contact a health care provider if:  You develop new symptoms.  Your symptoms get worse. Summary  Adhesive capsulitis, also called frozen shoulder, causes the shoulder to become stiff and painful to move.  You are more likely to have this condition if you are a woman and over age  67.  It is treated with physical therapy, medicines, and sometimes surgery. This information is not intended to replace advice given to you by your health care provider. Make sure you discuss any questions you have with your health care provider. Document Revised: 12/05/2017 Document Reviewed: 12/05/2017 Elsevier Patient Education  2021 [employer].

## 2020-09-27 NOTE — Addendum Note (Signed)
Addended by: Orma Flaming on: 09/27/2020 03:09 PM   Modules accepted: Orders

## 2020-09-28 ENCOUNTER — Ambulatory Visit (INDEPENDENT_AMBULATORY_CARE_PROVIDER_SITE_OTHER): Payer: Medicare Other | Admitting: Endocrinology

## 2020-09-28 VITALS — BP 134/70 | HR 71 | Ht 61.0 in | Wt 124.8 lb

## 2020-09-28 DIAGNOSIS — E119 Type 2 diabetes mellitus without complications: Secondary | ICD-10-CM | POA: Diagnosis not present

## 2020-09-28 DIAGNOSIS — Z794 Long term (current) use of insulin: Secondary | ICD-10-CM

## 2020-09-28 LAB — POCT GLYCOSYLATED HEMOGLOBIN (HGB A1C): Hemoglobin A1C: 7.8 % — AB (ref 4.0–5.6)

## 2020-09-28 MED ORDER — NOVOLOG MIX 70/30 FLEXPEN (70-30) 100 UNIT/ML ~~LOC~~ SUPN: PEN_INJECTOR | SUBCUTANEOUS | 3 refills | Status: DC

## 2020-09-28 NOTE — Patient Instructions (Addendum)
Please change the insulin to 14 units with breakfast, and 10 units with supper.   check your blood sugar twice a day.  vary the time of day when you check, between before the 3 meals, and at bedtime.  also check if you have symptoms of your blood sugar being too high or too low.  please keep a record of the readings and bring it to your next appointment here (or you can bring the meter itself).  You can write it on any piece of paper.  please call us sooner if your blood sugar goes below 70, or if most of your readings are over 200.  Please come back for a follow-up appointment in 4 months.

## 2020-09-28 NOTE — Progress Notes (Signed)
Subjective:    Patient ID: Tammy Hughes, female    DOB: 1932/10/02, 87 y.o.   MRN: [id number]  HPI Pt returns for f/u of diabetes mellitus:  DM type: Insulin-requiring type 2.  Dx'ed: 4098.   Complications: PN and CAD.  Therapy: insulin since 2005.  GDM: never.   DKA: never.   Severe hypoglycemia: never.   Pancreatitis: never.  Other: she chose bid premixed insulin.   Interval history: no cbg record, but states cbg varies from 119-340.  It is in general higher as the day goes on.   Past Medical History:  Diagnosis Date  . Allergy    SEASONAL  . ANXIETY 03/08/2008  . Cataract    REMOVED BILATERAL  . Depression   . DIVERTICULOSIS, COLON 03/08/2008  . DVT (deep venous thrombosis) ([place])   . GERD 02/23/2007  . HYPERCHOLESTEROLEMIA 02/01/2008  . HYPERTENSION 03/08/2008  . Insulin dependent diabetes mellitus   . OSTEOARTHRITIS 02/23/2007  . OSTEOPOROSIS 02/23/2007  . PERIPHERAL NEUROPATHY 02/23/2007  . STD (sexually transmitted disease)   . Stomach cancer ([place]) 07/20/14   invasive adenocarcinoma w/signet rings features  . Urinary incontinence   . Urolithiasis     Past Surgical History:  Procedure Laterality Date  . ABDOMINAL SURGERY    . CARDIAC CATHETERIZATION N/A 02/02/2016   Procedure: Left Heart Cath and Coronary Angiography;  Surgeon: Peter M Martinique, MD;  Location: [facility];  Service: Cardiovascular;  Laterality: N/A;  . CHOLECYSTECTOMY  1995  . ESOPHAGOGASTRODUODENOSCOPY  06/08/2002  . GASTRECTOMY N/A 07/20/2014   Procedure: PROXIMAL GASTRECTOMY;  Surgeon: Stark Klein, MD;  Location: [place];  Service: General;  Laterality: N/A;  . GASTROJEJUNOSTOMY N/A 07/20/2014   Procedure: GASTROJEJUNOSTOMY;  Surgeon: Stark Klein, MD;  Location: [place];  Service: General;  Laterality: N/A;  . LAPAROSCOPY N/A 07/20/2014   Procedure: LAPAROSCOPY DIAGNOSTIC;  Surgeon: Stark Klein, MD;  Location: [facility];  Service: General;  Laterality: N/A;    Social History   Socioeconomic History   . Marital status: Married    Spouse name: Not on file  . Number of children: Not on file  . Years of education: Not on file  . Highest education level: Not on file  Occupational History  . Occupation: Retired    Fish farm manager: RETIRED  Tobacco Use  . Smoking status: Never Smoker  . Smokeless tobacco: Never Used  Vaping Use  . Vaping Use: Never used  Substance and Sexual Activity  . Alcohol use: No    Alcohol/week: 0.0 standard drinks  . Drug use: No  . Sexual activity: Not Currently    Birth control/protection: Post-menopausal  Other Topics Concern  . Not on file  Social History Narrative   Retired-Domestic Assistant   Right handed    Lives with husband    Social Determinants of Health   Financial Resource Strain: Low Risk   . Difficulty of Paying Living Expenses: Not hard at all  Food Insecurity: No Food Insecurity  . Worried About Charity fundraiser in the Last Year: Never true  . Ran Out of Food in the Last Year: Never true  Transportation Needs: No Transportation Needs  . Lack of Transportation (Medical): No  . Lack of Transportation (Non-Medical): No  Physical Activity: Inactive  . Days of Exercise per Week: 0 days  . Minutes of Exercise per Session: 0 min  Stress: No Stress Concern Present  . Feeling of Stress : Not at all  Social Connections: Moderately Integrated  .  Frequency of Communication with Friends and Family: More than three times a week  . Frequency of Social Gatherings with Friends and Family: More than three times a week  . Attends Religious Services: More than 4 times per year  . Active Member of Clubs or Organizations: No  . Attends Archivist Meetings: Never  . Marital Status: Married  Human resources officer Violence: Not At Risk  . Fear of Current or Ex-Partner: No  . Emotionally Abused: No  . Physically Abused: No  . Sexually Abused: No    Current Outpatient Medications on File Prior to Visit  Medication Sig Dispense Refill  .  Acetaminophen (TYLENOL PO) Take 500 mg by mouth 2 (two) times daily.     Marland Kitchen acetaminophen-codeine (TYLENOL #3) 300-30 MG tablet Take 1 tablet by mouth every 6 (six) hours as needed for moderate pain or severe pain. 15 tablet 0  . benzonatate (TESSALON) 100 MG capsule Take 1 capsule (100 mg total) by mouth 2 (two) times daily as needed for cough. 20 capsule 0  . calcium carbonate (TUMS - DOSED IN MG ELEMENTAL CALCIUM) 500 MG chewable tablet Chew 1 tablet by mouth daily.    . cholecalciferol (VITAMIN D3) 25 MCG (1000 UT) tablet Take 1,000 Units by mouth daily.    . clopidogrel (PLAVIX) 75 MG tablet Take 1 tablet (75 mg total) by mouth daily. 90 tablet 2  . donepezil (ARICEPT) 10 MG tablet Take 1 tablet daily 90 tablet 3  . esomeprazole (NEXIUM) 40 MG capsule Take one pill daily for stomach 90 capsule 3  . meclizine (ANTIVERT) 25 MG tablet TAKE 1 TABLET BY MOUTH THREE TIMES DAILY IF NEEDED FOR DIZZINESS 30 tablet 0  . melatonin 3 MG TABS tablet Take 3 mg by mouth at bedtime.    . mirtazapine (REMERON) 15 MG tablet TAKE 1 TABLET BY MOUTH EACH NIGHT AT BEDTIME AS DIRECTED 90 tablet 3  . ONETOUCH VERIO test strip USE 1 STRIP TO CHECK GLUCOSE TWICE DAILY 200 each 0  . rosuvastatin (CRESTOR) 5 MG tablet Take 1 tablet (5 mg total) by mouth daily. 90 tablet 3   No current facility-administered medications on file prior to visit.    Allergies  Allergen Reactions  . Pirfenidone Diarrhea and Nausea And Vomiting  . Aspirin     rash    Family History  Problem Relation Age of Onset  . Cancer Father        Prostate Cancer  . Cancer Sister        Breast Cancer  . Diabetes Sister   . Diabetes Sister   . Other Mother        pneumonia   . Cancer Sister        breast cancer     BP 134/70 (BP Location: Right Arm, Patient Position: Sitting, Cuff Size: Normal)   Pulse 71   Ht 5\' 1"  (1.549 m)   Wt 124 lb 12.8 oz (56.6 kg)   SpO2 96%   BMI 23.58 kg/m    Review of Systems She denies hypoglycemia.       Objective:   Physical Exam VITAL SIGNS:  See vs page GENERAL: no distress Pulses: dorsalis pedis intact bilat.   MSK: no deformity of the feet CV: no leg edema Skin:  no ulcer on the feet.  normal color and temp on the feet. Neuro: sensation is intact to touch on the feet  A1c=7.8%     Assessment & Plan:  Insulin-requiring type 2  DM, with CAD: this is the best control this pt should aim for, given this regimen, which does match insulin to her changing needs throughout the day.  Based on the pattern of her cbg's, she needs some adjustment in her therapy.   Patient Instructions  Please change the insulin to 14 units with breakfast, and 10 units with supper.   check your blood sugar twice a day.  vary the time of day when you check, between before the 3 meals, and at bedtime.  also check if you have symptoms of your blood sugar being too high or too low.  please keep a record of the readings and bring it to your next appointment here (or you can bring the meter itself).  You can write it on any piece of paper.  please call us sooner if your blood sugar goes below 70, or if most of your readings are over 200.  Please come back for a follow-up appointment in 4 months.

## 2020-10-02 DIAGNOSIS — L0232 Furuncle of buttock: Secondary | ICD-10-CM | POA: Diagnosis not present

## 2020-10-02 DIAGNOSIS — Z4689 Encounter for fitting and adjustment of other specified devices: Secondary | ICD-10-CM | POA: Diagnosis not present

## 2020-10-05 ENCOUNTER — Encounter: Payer: Self-pay | Admitting: Family Medicine

## 2020-10-07 ENCOUNTER — Other Ambulatory Visit: Payer: Self-pay

## 2020-10-07 ENCOUNTER — Telehealth: Payer: Self-pay

## 2020-10-07 DIAGNOSIS — E119 Type 2 diabetes mellitus without complications: Secondary | ICD-10-CM

## 2020-10-07 MED ORDER — ONETOUCH VERIO VI STRP: ORAL_STRIP | 0 refills | Status: DC

## 2020-10-07 NOTE — Telephone Encounter (Signed)
Spoke with pt in regards to receiving her Novolog. She stated that she has already pick up from pharmacy.

## 2020-10-31 DIAGNOSIS — N8110 Cystocele, unspecified: Secondary | ICD-10-CM | POA: Diagnosis not present

## 2020-11-15 ENCOUNTER — Ambulatory Visit (INDEPENDENT_AMBULATORY_CARE_PROVIDER_SITE_OTHER): Payer: Medicare Other | Admitting: Pulmonary Disease

## 2020-11-15 ENCOUNTER — Encounter: Payer: Self-pay | Admitting: Pulmonary Disease

## 2020-11-15 ENCOUNTER — Other Ambulatory Visit: Payer: Self-pay

## 2020-11-15 DIAGNOSIS — J8410 Pulmonary fibrosis, unspecified: Secondary | ICD-10-CM | POA: Diagnosis not present

## 2020-11-15 NOTE — Progress Notes (Signed)
Subjective:    Patient ID: Tammy Hughes, female    DOB: [birth year], 87 y.o.   MRN: [id number]  HPI 87 year old presents to establish care for ILD She has postinflammatory fibrosis hours noted on CT chest dating back to 2015, CT abdomen from 2011 shows clear lung bases Last seen by Dr. Joya Gaskins 03/2014 Pulm fibrosis, basilar predominance.  Serology was negative except for low positive ANA 1:40 she was started on Esbriet, did not tolerate due to side effects  She has a history of esophageal stricture and was referred back to Dr. Deatra Ina and EGD showed gastric cancer. She subsequently underwent gastric resection and chemotherapy, did not tolerate radiation.  She follows up with oncology and Dr. Benay Spice heard bibasal crackles and PCP finally referred to [place].  She is accompanied by her daughter Rip Harbour today who corroborates history. She is able to carry out activities of daily living without significant shortness of breath.  She reports an occasional dry cough. There is no pedal edema, she denies recurrent chest colds  Significant tests/ events reviewed CT angiogram chest 10/2014 ILD, favored fibrotic NSIP, stable from 2015 PFTs 09/2013: DLCO 72% TLC 93% FEV1 80%  Ambulatory saturation 11/2020 saturation stayed at 97% after 2 laps    Past Medical History:  Diagnosis Date  . Allergy    SEASONAL  . ANXIETY 03/08/2008  . Cataract    REMOVED BILATERAL  . Depression   . DIVERTICULOSIS, COLON 03/08/2008  . DVT (deep venous thrombosis) ([place])   . GERD 02/23/2007  . HYPERCHOLESTEROLEMIA 02/01/2008  . HYPERTENSION 03/08/2008  . Insulin dependent diabetes mellitus   . OSTEOARTHRITIS 02/23/2007  . OSTEOPOROSIS 02/23/2007  . PERIPHERAL NEUROPATHY 02/23/2007  . STD (sexually transmitted disease)   . Stomach cancer ([place]) 07/20/14   invasive adenocarcinoma w/signet rings features  . Urinary incontinence   . Urolithiasis    Past Surgical History:  Procedure Laterality Date  . ABDOMINAL SURGERY    .  CARDIAC CATHETERIZATION N/A 02/02/2016   Procedure: Left Heart Cath and Coronary Angiography;  Surgeon: Peter M Martinique, MD;  Location: [facility];  Service: Cardiovascular;  Laterality: N/A;  . CHOLECYSTECTOMY  1995  . ESOPHAGOGASTRODUODENOSCOPY  06/08/2002  . GASTRECTOMY N/A 07/20/2014   Procedure: PROXIMAL GASTRECTOMY;  Surgeon: Stark Klein, MD;  Location: [place];  Service: General;  Laterality: N/A;  . GASTROJEJUNOSTOMY N/A 07/20/2014   Procedure: GASTROJEJUNOSTOMY;  Surgeon: Stark Klein, MD;  Location: [place];  Service: General;  Laterality: N/A;  . LAPAROSCOPY N/A 07/20/2014   Procedure: LAPAROSCOPY DIAGNOSTIC;  Surgeon: Stark Klein, MD;  Location: [place];  Service: General;  Laterality: N/A;    Allergies  Allergen Reactions  . Pirfenidone Diarrhea and Nausea And Vomiting  . Aspirin     rash    Social History   Socioeconomic History  . Marital status: Married    Spouse name: Not on file  . Number of children: Not on file  . Years of education: Not on file  . Highest education level: Not on file  Occupational History  . Occupation: Retired    Fish farm manager: RETIRED  Tobacco Use  . Smoking status: Never Smoker  . Smokeless tobacco: Never Used  Vaping Use  . Vaping Use: Never used  Substance and Sexual Activity  . Alcohol use: No    Alcohol/week: 0.0 standard drinks  . Drug use: No  . Sexual activity: Not Currently    Birth control/protection: Post-menopausal  Other Topics Concern  . Not on file  Social History Narrative   Dentist   Right handed    Lives with husband    Social Determinants of Health   Financial Resource Strain: Low Risk   . Difficulty of Paying Living Expenses: Not hard at all  Food Insecurity: No Food Insecurity  . Worried About Charity fundraiser in the Last Year: Never true  . Ran Out of Food in the Last Year: Never true  Transportation Needs: No Transportation Needs  . Lack of Transportation (Medical): No  . Lack of  Transportation (Non-Medical): No  Physical Activity: Inactive  . Days of Exercise per Week: 0 days  . Minutes of Exercise per Session: 0 min  Stress: No Stress Concern Present  . Feeling of Stress : Not at all  Social Connections: Moderately Integrated  . Frequency of Communication with Friends and Family: More than three times a week  . Frequency of Social Gatherings with Friends and Family: More than three times a week  . Attends Religious Services: More than 4 times per year  . Active Member of Clubs or Organizations: No  . Attends Archivist Meetings: Never  . Marital Status: Married  Human resources officer Violence: Not At Risk  . Fear of Current or Ex-Partner: No  . Emotionally Abused: No  . Physically Abused: No  . Sexually Abused: No     Family History  Problem Relation Age of Onset  . Cancer Father        Prostate Cancer  . Cancer Sister        Breast Cancer  . Diabetes Sister   . Diabetes Sister   . Other Mother        pneumonia   . Cancer Sister        breast cancer     Social History   Socioeconomic History  . Marital status: Married    Spouse name: Not on file  . Number of children: Not on file  . Years of education: Not on file  . Highest education level: Not on file  Occupational History  . Occupation: Retired    Fish farm manager: RETIRED  Tobacco Use  . Smoking status: Never Smoker  . Smokeless tobacco: Never Used  Vaping Use  . Vaping Use: Never used  Substance and Sexual Activity  . Alcohol use: No    Alcohol/week: 0.0 standard drinks  . Drug use: No  . Sexual activity: Not Currently    Birth control/protection: Post-menopausal  Other Topics Concern  . Not on file  Social History Narrative   Retired-Domestic Assistant   Right handed    Lives with husband    Social Determinants of Health   Financial Resource Strain: Low Risk   . Difficulty of Paying Living Expenses: Not hard at all  Food Insecurity: No Food Insecurity  . Worried About  Charity fundraiser in the Last Year: Never true  . Ran Out of Food in the Last Year: Never true  Transportation Needs: No Transportation Needs  . Lack of Transportation (Medical): No  . Lack of Transportation (Non-Medical): No  Physical Activity: Inactive  . Days of Exercise per Week: 0 days  . Minutes of Exercise per Session: 0 min  Stress: No Stress Concern Present  . Feeling of Stress : Not at all  Social Connections: Moderately Integrated  . Frequency of Communication with Friends and Family: More than three times a week  . Frequency of Social Gatherings with Friends and Family: More than three times  a week  . Attends Religious Services: More than 4 times per year  . Active Member of Clubs or Organizations: No  . Attends Archivist Meetings: Never  . Marital Status: Married  Human resources officer Violence: Not At Risk  . Fear of Current or Ex-Partner: No  . Emotionally Abused: No  . Physically Abused: No  . Sexually Abused: No      Review of Systems Constitutional: negative for anorexia, fevers and sweats  Eyes: negative for irritation, redness and visual disturbance  Ears, nose, mouth, throat, and face: negative for earaches, epistaxis, nasal congestion and sore throat  Respiratory: negative for sputum and wheezing  Cardiovascular: negative for chest pain, dyspnea, lower extremity edema, orthopnea, palpitations and syncope  Gastrointestinal: negative for abdominal pain, constipation, diarrhea, melena, nausea and vomiting  Genitourinary:negative for dysuria, frequency and hematuria  Hematologic/lymphatic: negative for bleeding, easy bruising and lymphadenopathy  Musculoskeletal:negative for arthralgias, muscle weakness and stiff joints  Neurological: negative for coordination problems, gait problems, headaches and weakness  Endocrine: negative for diabetic symptoms including polydipsia, polyuria and weight loss     Objective:   Physical Exam  Gen. Pleasant,  elderly, well-nourished, in no distress, normal affect ENT - no pallor,icterus, no post nasal drip Neck: No JVD, no thyromegaly, no carotid bruits Lungs: no use of accessory muscles, no dullness to percussion, bibasal dry crackles Cardiovascular: Rhythm regular, heart sounds  normal, no murmurs or gallops, no peripheral edema Abdomen: soft and non-tender, no hepatosplenomegaly, BS normal. Musculoskeletal: No deformities, no cyanosis or clubbing Neuro:  alert, non focal       Assessment & Plan:

## 2020-11-15 NOTE — Assessment & Plan Note (Signed)
This was previously classified as postinflammatory pulmonary fibrosis, appears to be fibrotic NSIP on prior CT although she was tried on Esbriet and failed due to side effects in 2015. We will reassess with a high-resolution CT chest for disease progression.  Symptomatically she seems to have done okay over the past 7 years and that is a good sign. We may also reassess with PFTs based on HRCT

## 2020-11-15 NOTE — Patient Instructions (Signed)
Ambulatory saturation. Schedule high-resolution CT chest to look for pulmonary fibrosis.

## 2020-11-30 ENCOUNTER — Ambulatory Visit (INDEPENDENT_AMBULATORY_CARE_PROVIDER_SITE_OTHER)
Admission: RE | Admit: 2020-11-30 | Discharge: 2020-11-30 | Disposition: A | Discharge status: Home / Self Care | Payer: BC Managed Care – PPO | Source: Ambulatory Visit | Attending: Pulmonary Disease | Admitting: Pulmonary Disease

## 2020-11-30 ENCOUNTER — Other Ambulatory Visit: Payer: Self-pay

## 2020-11-30 DIAGNOSIS — J8410 Pulmonary fibrosis, unspecified: Secondary | ICD-10-CM | POA: Diagnosis not present

## 2020-12-07 ENCOUNTER — Other Ambulatory Visit: Payer: Self-pay

## 2020-12-07 DIAGNOSIS — J8410 Pulmonary fibrosis, unspecified: Secondary | ICD-10-CM

## 2020-12-07 NOTE — Progress Notes (Signed)
Called and spoke with patient's daughter, Rip Harbour per [place] and went over HRCT results with daughter. All questions answered and melinda expressed full understanding of results. Order placed for PFT per Dr Elsworth Soho and scheduled PFT at the [facility] office on 12/19/20 at noon and follow up office visit with Dr Elsworth Soho at the [facility] office at 2:45pm (after PFT) on 12/19/20. Patient will go eat lunch and come back for appointment with Dr Elsworth Soho after PFT and ok with Daughter to do this. COVID test scheduled and address given to Rip Harbour to be done on Friday 12/15/20 at 1:50pm. Daughter confirmed and agreeable with all appointment times, dates and locations. Nothing further needed at this time. Will route to Dr Elsworth Soho as Juluis Rainier.

## 2020-12-15 ENCOUNTER — Other Ambulatory Visit (HOSPITAL_COMMUNITY)
Admission: RE | Admit: 2020-12-15 | Discharge: 2020-12-15 | Disposition: A | Discharge status: Home / Self Care | Payer: Medicare (Managed Care) | Source: Ambulatory Visit | Attending: Pulmonary Disease | Admitting: Pulmonary Disease

## 2020-12-15 DIAGNOSIS — Z01812 Encounter for preprocedural laboratory examination: Secondary | ICD-10-CM | POA: Insufficient documentation

## 2020-12-15 DIAGNOSIS — Z20822 Contact with and (suspected) exposure to covid-19: Secondary | ICD-10-CM | POA: Insufficient documentation

## 2020-12-15 LAB — SARS CORONAVIRUS 2 (TAT 6-24 HRS): SARS Coronavirus 2: NEGATIVE

## 2020-12-19 ENCOUNTER — Ambulatory Visit (INDEPENDENT_AMBULATORY_CARE_PROVIDER_SITE_OTHER): Payer: BC Managed Care – PPO | Admitting: Pulmonary Disease

## 2020-12-19 ENCOUNTER — Encounter: Payer: Self-pay | Admitting: Pulmonary Disease

## 2020-12-19 ENCOUNTER — Other Ambulatory Visit: Payer: Self-pay

## 2020-12-19 DIAGNOSIS — J8410 Pulmonary fibrosis, unspecified: Secondary | ICD-10-CM | POA: Diagnosis not present

## 2020-12-19 DIAGNOSIS — J84112 Idiopathic pulmonary fibrosis: Secondary | ICD-10-CM | POA: Diagnosis not present

## 2020-12-19 LAB — PULMONARY FUNCTION TEST
DL/VA % pred: 95 %
DL/VA: 3.93 ml/min/mmHg/L
DLCO cor % pred: 75 %
DLCO cor: 12.57 ml/min/mmHg
DLCO unc % pred: 75 %
DLCO unc: 12.57 ml/min/mmHg
FEF 25-75 Post: 0.9 L/sec
FEF 25-75 Pre: 0.97 L/sec
FEF2575-%Change-Post: -6 %
FEF2575-%Pred-Post: 98 %
FEF2575-%Pred-Pre: 105 %
FEV1-%Change-Post: 4 %
FEV1-%Pred-Post: 92 %
FEV1-%Pred-Pre: 87 %
FEV1-Post: 1.07 L
FEV1-Pre: 1.02 L
FEV1FVC-%Change-Post: 0 %
FEV1FVC-%Pred-Pre: 102 %
FEV6-%Change-Post: 5 %
FEV6-%Pred-Post: 98 %
FEV6-%Pred-Pre: 93 %
FEV6-Post: 1.4 L
FEV6-Pre: 1.32 L
FEV6FVC-%Change-Post: 0 %
FEV6FVC-%Pred-Post: 105 %
FEV6FVC-%Pred-Pre: 105 %
FVC-%Change-Post: 4 %
FVC-%Pred-Post: 92 %
FVC-%Pred-Pre: 88 %
FVC-Post: 1.4 L
FVC-Pre: 1.33 L
Post FEV1/FVC ratio: 76 %
Post FEV6/FVC ratio: 100 %
Pre FEV1/FVC ratio: 76 %
Pre FEV6/FVC Ratio: 100 %

## 2020-12-19 NOTE — Progress Notes (Signed)
   Subjective:    Patient ID: Tammy Hughes, female    DOB: January 11, 1933, 87 y.o.   MRN: [id number]  HPI  87 year old  For FU of  ILD She has postinflammatory fibrosis hours noted on CT chest dating back to 2015, CT abdomen from 2011 shows clear lung bases Last seen by Dr. Joya Gaskins 03/2014 Pulm fibrosis, basilar predominance.  Serology was negative except for low positive ANA 1:40 she was started on Esbriet, did not tolerate due to side effects  PMH - esophageal stricture, gastric cancer.s/p resection and chemotherapy, did not tolerate radiation    Significant tests/ events reviewed HRCT 11/2020 >> widespread but patchy areas of ground-glass attenuation, septal thickening, subpleural reticulation, parenchymal banding, traction bronchiectasis and honeycombing. Findings have a mild craniocaudal gradient and are clearly progressive compared to prior study from 2016   CT angiogram chest 10/2014 ILD, favored fibrotic NSIP, stable from 2015  PFTs 12/2020 FVC 88%, DLCO 12.57/75% PFTs 09/2013: DLCO 72% TLC 93% FEV1 80%  Ambulatory saturation 11/2020 saturation stayed at 97% after 2 laps  Review of Systems neg for any significant sore throat, dysphagia, itching, sneezing, nasal congestion or excess/ purulent secretions, fever, chills, sweats, unintended wt loss, pleuritic or exertional cp, hempoptysis, orthopnea pnd or change in chronic leg swelling. Also denies presyncope, palpitations, heartburn, abdominal pain, nausea, vomiting, diarrhea or change in bowel or urinary habits, dysuria,hematuria, rash, arthralgias, visual complaints, headache, numbness weakness or ataxia.     Objective:   Physical Exam  Gen. Pleasant, elderly,well-nourished, in no distress ENT - no thrush, no pallor/icterus,no post nasal drip Neck: No JVD, no thyromegaly, no carotid bruits Lungs: no use of accessory muscles, no dullness to percussion, bibasal dry  rales no rhonchi  Cardiovascular: Rhythm regular, heart sounds   normal, no murmurs or gallops, no peripheral edema Musculoskeletal: No deformities, no cyanosis or clubbing        Assessment & Plan:

## 2020-12-19 NOTE — Assessment & Plan Note (Signed)
Reviewed HRCT, this shows peripheral and bibasilar distribution with craniocaudal gradient and honeycombing so typical UIP pattern and I would reclassify this as idiopathic pulmonary fibrosis. Fortunately lung function is relatively preserved with DLCO at 75% and FVC maintained.  She does not desaturate on exertion. Unfortunately she has not tolerated esbriet in the past.  We discussed natural history of IPF.  She has gone for about 7 years with minimal progression.  I explained that IPF flares can occur at any level of lung function.  I do not think she will tolerate Ofev well.  We discussed side effects of this medication.  Shared decision making with patient, daughter and her husband in the room.  She decided against antifibrotic medication.  Her main symptom burden is that of cough but even this is not to the point where she needs a maintenance medication for it. We will monitor her on a 2-month basis

## 2020-12-19 NOTE — Progress Notes (Signed)
Spirometry pre and post and dlco done today. 

## 2020-12-19 NOTE — Patient Instructions (Signed)
Lung function is at 75% You have lung fibrosis which has worsened compared to 2016

## 2020-12-21 ENCOUNTER — Ambulatory Visit: Payer: Medicare Other | Admitting: Family Medicine

## 2021-01-23 ENCOUNTER — Other Ambulatory Visit: Payer: Self-pay | Admitting: Endocrinology

## 2021-01-23 DIAGNOSIS — E119 Type 2 diabetes mellitus without complications: Secondary | ICD-10-CM

## 2021-01-29 ENCOUNTER — Other Ambulatory Visit: Payer: Self-pay

## 2021-01-29 ENCOUNTER — Ambulatory Visit (INDEPENDENT_AMBULATORY_CARE_PROVIDER_SITE_OTHER): Payer: Medicare (Managed Care) | Admitting: Endocrinology

## 2021-01-29 VITALS — BP 170/70 | HR 71 | Ht 61.0 in | Wt 125.2 lb

## 2021-01-29 DIAGNOSIS — E119 Type 2 diabetes mellitus without complications: Secondary | ICD-10-CM | POA: Diagnosis not present

## 2021-01-29 DIAGNOSIS — Z794 Long term (current) use of insulin: Secondary | ICD-10-CM

## 2021-01-29 LAB — POCT GLYCOSYLATED HEMOGLOBIN (HGB A1C): Hemoglobin A1C: 7.8 % — AB (ref 4.0–5.6)

## 2021-01-29 MED ORDER — NOVOLOG MIX 70/30 FLEXPEN (70-30) 100 UNIT/ML ~~LOC~~ SUPN: PEN_INJECTOR | SUBCUTANEOUS | 3 refills | Status: DC

## 2021-01-29 NOTE — Progress Notes (Signed)
Subjective:    Patient ID: Tammy Hughes, female    DOB: 1932-12-05, 87 y.o.   MRN: [id number]  HPI Pt returns for f/u of diabetes mellitus:  DM type: Insulin-requiring type 2.  Dx'ed: 2633.   Complications: PN and CAD.  Therapy: insulin since 2005.  GDM: never.   DKA: never.   Severe hypoglycemia: never.   Pancreatitis: never.  Other: she chose bid premixed insulin.   Interval history: no cbg record, but states cbg varies from 99-300.  It is still in general higher as the day goes on. Past Medical History:  Diagnosis Date   Allergy    SEASONAL   ANXIETY 03/08/2008   Cataract    REMOVED BILATERAL   Depression    DIVERTICULOSIS, COLON 03/08/2008   DVT (deep venous thrombosis) ([place])    GERD 02/23/2007   HYPERCHOLESTEROLEMIA 02/01/2008   HYPERTENSION 03/08/2008   Insulin dependent diabetes mellitus    OSTEOARTHRITIS 02/23/2007   OSTEOPOROSIS 02/23/2007   PERIPHERAL NEUROPATHY 02/23/2007   STD (sexually transmitted disease)    Stomach cancer ([place]) 07/20/14   invasive adenocarcinoma w/signet rings features   Urinary incontinence    Urolithiasis     Past Surgical History:  Procedure Laterality Date   ABDOMINAL SURGERY     CARDIAC CATHETERIZATION N/A 02/02/2016   Procedure: Left Heart Cath and Coronary Angiography;  Surgeon: Peter M Martinique, MD;  Location: [facility];  Service: Cardiovascular;  Laterality: N/A;   CHOLECYSTECTOMY  1995   ESOPHAGOGASTRODUODENOSCOPY  06/08/2002   GASTRECTOMY N/A 07/20/2014   Procedure: PROXIMAL GASTRECTOMY;  Surgeon: Stark Klein, MD;  Location: [facility];  Service: General;  Laterality: N/A;   GASTROJEJUNOSTOMY N/A 07/20/2014   Procedure: GASTROJEJUNOSTOMY;  Surgeon: Stark Klein, MD;  Location: [facility];  Service: General;  Laterality: N/A;   LAPAROSCOPY N/A 07/20/2014   Procedure: LAPAROSCOPY DIAGNOSTIC;  Surgeon: Stark Klein, MD;  Location: [facility];  Service: General;  Laterality: N/A;    Social History   Socioeconomic History   Marital status:  Married    Spouse name: Not on file   Number of children: Not on file   Years of education: Not on file   Highest education level: Not on file  Occupational History   Occupation: Retired    Employer: RETIRED  Tobacco Use   Smoking status: Never   Smokeless tobacco: Never  Vaping Use   Vaping Use: Never used  Substance and Sexual Activity   Alcohol use: No    Alcohol/week: 0.0 standard drinks   Drug use: No   Sexual activity: Not Currently    Birth control/protection: Post-menopausal  Other Topics Concern   Not on file  Social History Narrative   Retired-Domestic Assistant   Right handed    Lives with husband    Social Determinants of Health   Financial Resource Strain: Low Risk    Difficulty of Paying Living Expenses: Not hard at all  Food Insecurity: No Food Insecurity   Worried About Charity fundraiser in the Last Year: Never true   Arboriculturist in the Last Year: Never true  Transportation Needs: No Transportation Needs   Lack of Transportation (Medical): No   Lack of Transportation (Non-Medical): No  Physical Activity: Inactive   Days of Exercise per Week: 0 days   Minutes of Exercise per Session: 0 min  Stress: No Stress Concern Present   Feeling of Stress : Not at all  Social Connections: Moderately Integrated   Frequency  of Communication with Friends and Family: More than three times a week   Frequency of Social Gatherings with Friends and Family: More than three times a week   Attends Religious Services: More than 4 times per year   Active Member of [employer] or Organizations: No   Attends Archivist Meetings: Never   Marital Status: Married  Human resources officer Violence: Not At Risk   Fear of Current or Ex-Partner: No   Emotionally Abused: No   Physically Abused: No   Sexually Abused: No    Current Outpatient Medications on File Prior to Visit  Medication Sig Dispense Refill   Acetaminophen (TYLENOL PO) Take 500 mg by mouth 2 (two) times daily.       calcium carbonate (TUMS - DOSED IN MG ELEMENTAL CALCIUM) 500 MG chewable tablet Chew 1 tablet by mouth daily.     cholecalciferol (VITAMIN D3) 25 MCG (1000 UT) tablet Take 1,000 Units by mouth daily.     clopidogrel (PLAVIX) 75 MG tablet Take 1 tablet (75 mg total) by mouth daily. 90 tablet 2   donepezil (ARICEPT) 10 MG tablet Take 1 tablet daily 90 tablet 3   esomeprazole (NEXIUM) 40 MG capsule Take one pill daily for stomach 90 capsule 3   glucose blood (ONETOUCH VERIO) test strip CHECK BLOOD SUGAR TWICE DAILY 200 strip 0   meclizine (ANTIVERT) 25 MG tablet TAKE 1 TABLET BY MOUTH THREE TIMES DAILY IF NEEDED FOR DIZZINESS 30 tablet 0   melatonin 3 MG TABS tablet Take 3 mg by mouth at bedtime.     mirtazapine (REMERON) 15 MG tablet TAKE 1 TABLET BY MOUTH EACH NIGHT AT BEDTIME AS DIRECTED 90 tablet 3   rosuvastatin (CRESTOR) 5 MG tablet Take 1 tablet (5 mg total) by mouth daily. 90 tablet 3   No current facility-administered medications on file prior to visit.    Allergies  Allergen Reactions   Pirfenidone Diarrhea and Nausea And Vomiting   Aspirin     rash    Family History  Problem Relation Age of Onset   Cancer Father        Prostate Cancer   Cancer Sister        Breast Cancer   Diabetes Sister    Diabetes Sister    Other Mother        pneumonia    Cancer Sister        breast cancer     BP (!) 170/70 (BP Location: Right Arm, Patient Position: Sitting, Cuff Size: Normal)   Pulse 71   Ht 5\' 1"  (1.549 m)   Wt 125 lb 3.2 oz (56.8 kg)   SpO2 96%   BMI 23.66 kg/m    Review of Systems She denies hypoglycemia    Objective:   Physical Exam Pulses: dorsalis pedis intact bilat.   MSK: no deformity of the feet CV: no leg edema Skin:  no ulcer on the feet.  normal color and temp on the feet. Neuro: sensation is intact to touch on the feet  Lab Results  Component Value Date   HGBA1C 7.8 (A) 01/29/2021       Assessment & Plan:  Insulin-requiring type 2 DM: Based  on the pattern of her cbg's, she needs some adjustment in her therapy.    Patient Instructions  Please change the insulin to 16 units with breakfast, and 8 units with supper.   check your blood sugar twice a day.  vary the time of day when you check,  between before the 3 meals, and at bedtime.  also check if you have symptoms of your blood sugar being too high or too low.  please keep a record of the readings and bring it to your next appointment here (or you can bring the meter itself).  You can write it on any piece of paper.  please call us sooner if your blood sugar goes below 70, or if most of your readings are over 200.  Please come back for a follow-up appointment in 4 months.

## 2021-01-29 NOTE — Patient Instructions (Addendum)
Please change the insulin to 16 units with breakfast, and 8 units with supper.   check your blood sugar twice a day.  vary the time of day when you check, between before the 3 meals, and at bedtime.  also check if you have symptoms of your blood sugar being too high or too low.  please keep a record of the readings and bring it to your next appointment here (or you can bring the meter itself).  You can write it on any piece of paper.  please call us sooner if your blood sugar goes below 70, or if most of your readings are over 200.  Please come back for a follow-up appointment in 4 months.

## 2021-02-05 ENCOUNTER — Encounter: Payer: Self-pay | Admitting: Family Medicine

## 2021-02-05 ENCOUNTER — Other Ambulatory Visit: Payer: Self-pay

## 2021-02-05 ENCOUNTER — Ambulatory Visit (INDEPENDENT_AMBULATORY_CARE_PROVIDER_SITE_OTHER): Payer: Medicare (Managed Care) | Admitting: Family Medicine

## 2021-02-05 VITALS — BP 132/71 | HR 56 | Temp 97.6°F | Ht 61.0 in | Wt 123.2 lb

## 2021-02-05 DIAGNOSIS — M1990 Unspecified osteoarthritis, unspecified site: Secondary | ICD-10-CM

## 2021-02-05 DIAGNOSIS — E1169 Type 2 diabetes mellitus with other specified complication: Secondary | ICD-10-CM

## 2021-02-05 DIAGNOSIS — E559 Vitamin D deficiency, unspecified: Secondary | ICD-10-CM

## 2021-02-05 DIAGNOSIS — R413 Other amnesia: Secondary | ICD-10-CM

## 2021-02-05 DIAGNOSIS — E1142 Type 2 diabetes mellitus with diabetic polyneuropathy: Secondary | ICD-10-CM

## 2021-02-05 DIAGNOSIS — C169 Malignant neoplasm of stomach, unspecified: Secondary | ICD-10-CM

## 2021-02-05 DIAGNOSIS — E1159 Type 2 diabetes mellitus with other circulatory complications: Secondary | ICD-10-CM | POA: Diagnosis not present

## 2021-02-05 DIAGNOSIS — I152 Hypertension secondary to endocrine disorders: Secondary | ICD-10-CM

## 2021-02-05 DIAGNOSIS — G4700 Insomnia, unspecified: Secondary | ICD-10-CM | POA: Insufficient documentation

## 2021-02-05 DIAGNOSIS — J84112 Idiopathic pulmonary fibrosis: Secondary | ICD-10-CM | POA: Diagnosis not present

## 2021-02-05 DIAGNOSIS — K219 Gastro-esophageal reflux disease without esophagitis: Secondary | ICD-10-CM

## 2021-02-05 DIAGNOSIS — E785 Hyperlipidemia, unspecified: Secondary | ICD-10-CM

## 2021-02-05 NOTE — Assessment & Plan Note (Signed)
Follow-up with neurology.  She is on Aricept 10 mg daily.

## 2021-02-05 NOTE — Assessment & Plan Note (Signed)
Follows with endocrinology.  Last A1c 7.8.  She is on insulin 70/30 and tolerating well.

## 2021-02-05 NOTE — Patient Instructions (Signed)
It was very nice to see you today!  No changes today.  Please check with your pharmacy regarding the shingles vaccines.  I will see you back in 6 months.  Come back to see me sooner if needed.  Take care, Dr Jerline Pain  PLEASE NOTE:  If you had any lab tests please let us know if you have not heard back within a few days. You may see your results on mychart before we have a chance to review them but we will give you a call once they are reviewed by [place]. If we ordered any referrals today, please let us know if you have not heard from their office within the next week.   Please try these tips to maintain a healthy lifestyle:  Eat at least 3 REAL meals and 1-2 snacks per day.  Aim for no more than 5 hours between eating.  If you eat breakfast, please do so within one hour of getting up.   Each meal should contain half fruits/vegetables, one quarter protein, and one quarter carbs (no bigger than a computer mouse)  Cut down on sweet beverages. This includes juice, soda, and sweet tea.   Drink at least 1 glass of water with each meal and aim for at least 8 glasses per day  Exercise at least 150 minutes every week.

## 2021-02-05 NOTE — Assessment & Plan Note (Addendum)
On Crestor 5 mg daily.  Last LDL at goal.

## 2021-02-05 NOTE — Assessment & Plan Note (Signed)
Follows with pulmonology 

## 2021-02-05 NOTE — Assessment & Plan Note (Signed)
Discussed conservative measures including ice, compression.  She can also use Tylenol over-the-counter meds as needed.  Recommended Voltaren.

## 2021-02-05 NOTE — Assessment & Plan Note (Signed)
At goal today without medications. 

## 2021-02-05 NOTE — Progress Notes (Signed)
Tammy Hughes is a 87 y.o. female who presents today for an office visit.  She is transferring care.   Assessment/Plan:  Chronic Problems Addressed Today: Memory loss Follow-up with neurology.  She is on Aricept 10 mg daily.  Type 2 diabetes mellitus with peripheral neuropathy ([place]) Follows with endocrinology.  Last A1c 7.8.  She is on insulin 70/30 and tolerating well.  IPF (idiopathic pulmonary fibrosis) ([place]) Follows with pulmonology.  Osteoarthritis Discussed conservative measures including ice, compression.  She can also use Tylenol over-the-counter meds as needed.  Recommended Voltaren.  GERD On Nexium 40 mg daily.  Hypertension associated with diabetes ([place]) At goal today without medications.  Dyslipidemia associated with type 2 diabetes mellitus ([facility]) On Crestor 5 mg daily.  Last LDL at goal.  Vitamin D deficiency On vitamin D supplementation.  Gastric cancer-s/p gastrectomy 07/20/14 Follows with oncology.  No signs of recurrence.  Preventative Healthcare Discussed shingles vaccine.  She will check with pharmacy.  Follow-up 6 months.    Subjective:  HPI: No acute complaints today.  See A/p for status of chronic conditions.  PMH:  The following were reviewed and entered/updated in epic: Past Medical History:  Diagnosis Date   Allergy    SEASONAL   ANXIETY 03/08/2008   Cataract    REMOVED BILATERAL   Depression    DIVERTICULOSIS, COLON 03/08/2008   DVT (deep venous thrombosis) ([place])    GERD 02/23/2007   HYPERCHOLESTEROLEMIA 02/01/2008   HYPERTENSION 03/08/2008   Insulin dependent diabetes mellitus    OSTEOARTHRITIS 02/23/2007   OSTEOPOROSIS 02/23/2007   PERIPHERAL NEUROPATHY 02/23/2007   STD (sexually transmitted disease)    Stomach cancer ([place]) 07/20/14   invasive adenocarcinoma w/signet rings features   Urinary incontinence    Urolithiasis    Patient Active Problem List   Diagnosis Date Noted   Insomnia 02/05/2021   Uterine prolapse 01/10/2020    Vitamin D deficiency 03/23/2019   Genital herpes 08/19/2018   CAD (coronary artery disease) 08/13/2018   TIA (transient ischemic attack) 08/13/2018   Normocytic anemia 02/01/2016   Memory loss 08/02/2015   DVT (deep venous thrombosis) ([place]) 10/27/2014   Type 2 diabetes mellitus with peripheral neuropathy ([place]) 08/04/2014   NSVT post-op 07/24/2014   Gastric cancer-s/p gastrectomy 07/20/14 06/15/2014   IPF (idiopathic pulmonary fibrosis) ([place]) 08/06/2013   NASH (nonalcoholic steatohepatitis) 10/21/2011   BACK PAIN, LUMBAR 12/19/2009   Hypertension associated with diabetes ([place]) 03/08/2008   Non-allergic rhinitis 03/08/2008   Dyslipidemia associated with type 2 diabetes mellitus ([place]) 02/01/2008   GERD 02/23/2007   Osteoarthritis 02/23/2007   Osteoporosis 02/23/2007   Past Surgical History:  Procedure Laterality Date   ABDOMINAL SURGERY     CARDIAC CATHETERIZATION N/A 02/02/2016   Procedure: Left Heart Cath and Coronary Angiography;  Surgeon: Peter M Martinique, MD;  Location: [facility];  Service: Cardiovascular;  Laterality: N/A;   CHOLECYSTECTOMY  1995   ESOPHAGOGASTRODUODENOSCOPY  06/08/2002   GASTRECTOMY N/A 07/20/2014   Procedure: PROXIMAL GASTRECTOMY;  Surgeon: Stark Klein, MD;  Location: [facility];  Service: General;  Laterality: N/A;   GASTROJEJUNOSTOMY N/A 07/20/2014   Procedure: GASTROJEJUNOSTOMY;  Surgeon: Stark Klein, MD;  Location: [facility];  Service: General;  Laterality: N/A;   LAPAROSCOPY N/A 07/20/2014   Procedure: LAPAROSCOPY DIAGNOSTIC;  Surgeon: Stark Klein, MD;  Location: [facility];  Service: General;  Laterality: N/A;    Family History  Problem Relation Age of Onset   Cancer Father  Prostate Cancer   Cancer Sister        Breast Cancer   Diabetes Sister    Diabetes Sister    Other Mother        pneumonia    Cancer Sister        breast cancer     Medications- reviewed and updated Current Outpatient Medications  Medication Sig Dispense Refill    Acetaminophen (TYLENOL PO) Take 500 mg by mouth 2 (two) times daily.      calcium carbonate (TUMS - DOSED IN MG ELEMENTAL CALCIUM) 500 MG chewable tablet Chew 1 tablet by mouth daily.     cholecalciferol (VITAMIN D3) 25 MCG (1000 UT) tablet Take 1,000 Units by mouth daily.     clopidogrel (PLAVIX) 75 MG tablet Take 1 tablet (75 mg total) by mouth daily. 90 tablet 2   donepezil (ARICEPT) 10 MG tablet Take 1 tablet daily 90 tablet 3   esomeprazole (NEXIUM) 40 MG capsule Take one pill daily for stomach 90 capsule 3   glucose blood (ONETOUCH VERIO) test strip CHECK BLOOD SUGAR TWICE DAILY 200 strip 0   insulin aspart protamine - aspart (NOVOLOG MIX 70/30 FLEXPEN) (70-30) 100 UNIT/ML FlexPen 16 units with breakfast, and 8 units with supper, and pen needles 2/day 30 mL 3   meclizine (ANTIVERT) 25 MG tablet TAKE 1 TABLET BY MOUTH THREE TIMES DAILY IF NEEDED FOR DIZZINESS 30 tablet 0   melatonin 3 MG TABS tablet Take 3 mg by mouth at bedtime.     mirtazapine (REMERON) 15 MG tablet TAKE 1 TABLET BY MOUTH EACH NIGHT AT BEDTIME AS DIRECTED 90 tablet 3   rosuvastatin (CRESTOR) 5 MG tablet Take 1 tablet (5 mg total) by mouth daily. 90 tablet 3   No current facility-administered medications for this visit.    Allergies-reviewed and updated Allergies  Allergen Reactions   Pirfenidone Diarrhea and Nausea And Vomiting   Aspirin     rash    Social History   Socioeconomic History   Marital status: Married    Spouse name: Not on file   Number of children: Not on file   Years of education: Not on file   Highest education level: Not on file  Occupational History   Occupation: Retired    Fish farm manager: RETIRED  Tobacco Use   Smoking status: Never   Smokeless tobacco: Never  Vaping Use   Vaping Use: Never used  Substance and Sexual Activity   Alcohol use: No    Alcohol/week: 0.0 standard drinks   Drug use: No   Sexual activity: Not Currently    Birth control/protection: Post-menopausal  Other Topics  Concern   Not on file  Social History Narrative   Retired-Domestic Assistant   Right handed    Lives with husband    Social Determinants of Health   Financial Resource Strain: Low Risk    Difficulty of Paying Living Expenses: Not hard at all  Food Insecurity: No Food Insecurity   Worried About Charity fundraiser in the Last Year: Never true   [place] in the Last Year: Never true  Transportation Needs: No Transportation Needs   Lack of Transportation (Medical): No   Lack of Transportation (Non-Medical): No  Physical Activity: Inactive   Days of Exercise per Week: 0 days   Minutes of Exercise per Session: 0 min  Stress: No Stress Concern Present   Feeling of Stress : Not at all  Social Connections: Moderately Integrated   Frequency  of Communication with Friends and Family: More than three times a week   Frequency of Social Gatherings with Friends and Family: More than three times a week   Attends Religious Services: More than 4 times per year   Active Member of [employer] or Organizations: No   Attends Music therapist: Never   Marital Status: Married          Objective:  Physical Exam: BP 132/71   Pulse (!) 56   Temp 97.6 F (36.4 C) (Temporal)   Ht '5\' 1"'$  (1.549 m)   Wt 123 lb 3.2 oz (55.9 kg)   SpO2 97%   BMI 23.28 kg/m   Gen: No acute distress, resting comfortably CV: Regular rate and rhythm with no murmurs appreciated Pulm: Normal work of breathing, clear to auscultation bilaterally with no crackles, wheezes, or rhonchi Neuro: Grossly normal, moves all extremities Psych: Normal affect and thought content      I,Jordan Kelly,acting as a scribe for Dimas Chyle, MD.,have documented all relevant documentation on the behalf of Dimas Chyle, MD,as directed by  Dimas Chyle, MD while in the presence of Dimas Chyle, MD.  I, Dimas Chyle, MD, have reviewed all documentation for this visit. The documentation on 02/05/21 for the exam, diagnosis,  procedures, and orders are all accurate and complete.  Time Spent: 45 minutes of total time was spent on the date of the encounter performing the following actions: chart review prior to seeing the patient including visits with previous PCP and recent specialist visits, obtaining history, performing a medically necessary exam, counseling on the treatment plan, placing orders, and documenting in our EHR.     Algis Greenhouse. Jerline Pain, MD 02/05/2021 1:59 PM

## 2021-02-05 NOTE — Assessment & Plan Note (Signed)
Follows with oncology.  No signs of recurrence.

## 2021-02-05 NOTE — Assessment & Plan Note (Signed)
On Nexium 40 mg daily.

## 2021-02-05 NOTE — Assessment & Plan Note (Signed)
On vitamin D supplementation.

## 2021-03-07 ENCOUNTER — Ambulatory Visit: Payer: Medicare Other | Admitting: Neurology

## 2021-03-13 ENCOUNTER — Ambulatory Visit: Payer: Medicare (Managed Care) | Admitting: Physician Assistant

## 2021-03-14 ENCOUNTER — Other Ambulatory Visit: Payer: Self-pay

## 2021-03-14 ENCOUNTER — Ambulatory Visit (INDEPENDENT_AMBULATORY_CARE_PROVIDER_SITE_OTHER): Payer: Medicare (Managed Care) | Admitting: Physician Assistant

## 2021-03-14 ENCOUNTER — Encounter: Payer: Self-pay | Admitting: Physician Assistant

## 2021-03-14 VITALS — BP 162/76 | HR 53 | Temp 98.1°F | Ht 61.0 in | Wt 128.2 lb

## 2021-03-14 DIAGNOSIS — M545 Low back pain, unspecified: Secondary | ICD-10-CM | POA: Diagnosis not present

## 2021-03-14 LAB — POCT URINALYSIS DIPSTICK
Bilirubin, UA: NEGATIVE
Blood, UA: NEGATIVE
Glucose, UA: POSITIVE — AB
Ketones, UA: NEGATIVE
Leukocytes, UA: NEGATIVE
Nitrite, UA: NEGATIVE
Protein, UA: POSITIVE — AB
Spec Grav, UA: 1.02 (ref 1.010–1.025)
Urobilinogen, UA: 0.2 E.U./dL
pH, UA: 5.5 (ref 5.0–8.0)

## 2021-03-14 MED ORDER — PREDNISONE 20 MG PO TABS: 20.0000 mg | ORAL_TABLET | Freq: Two times a day (BID) | ORAL | 0 refills | Status: AC

## 2021-03-14 NOTE — Patient Instructions (Signed)
I think this may be a back spasm / strain. Your urine is clear. Will try anti-inflammatory treatment at this time with prednisone.  Monitor your sugar levels. You may also take Tylenol. Use ice or heat to the area and rest. Urgent care or Emergency dept if acute sudden change or worsening symptoms. Call next week with update.

## 2021-03-14 NOTE — Progress Notes (Signed)
Acute Office Visit  Subjective:    Patient ID: Tammy Hughes, female    DOB: [birth year], 87 y.o.   MRN: [id number]  Chief Complaint  Patient presents with   Back Pain    Right Side   Abdominal Pain    Right side x 1 week    HPI Patient is in today for right sided abdominal and back pain x 1 week. She thought she might have a bladder infection and she went to urgent care on 03/09/21. She was given antibiotics, but then called to tell her it was negative for infection two days later & she stopped the antibiotics. No blood work or imaging done. Pain feels only a slightly bit better & says antibiotics helped her to sleep at night. 8/10 currently. Tylenol does help. Staying in one position seems to worsen the pain. Worse at night. Hx stomach cancer about 7 years ago. No falls. No known injury.  Negative for fever, chills, nausea, vomiting, dysuria, blood in urine or stool. No constipation or diarrhea.  No recent sickness.  Denies hx of UTIs or stomach issues.   Past Medical History:  Diagnosis Date   Allergy    SEASONAL   ANXIETY 03/08/2008   Cataract    REMOVED BILATERAL   Depression    DIVERTICULOSIS, COLON 03/08/2008   DVT (deep venous thrombosis) (HCC)    GERD 02/23/2007   HYPERCHOLESTEROLEMIA 02/01/2008   HYPERTENSION 03/08/2008   Insulin dependent diabetes mellitus    OSTEOARTHRITIS 02/23/2007   OSTEOPOROSIS 02/23/2007   PERIPHERAL NEUROPATHY 02/23/2007   STD (sexually transmitted disease)    Stomach cancer (HCC) 07/20/14   invasive adenocarcinoma w/signet rings features   Urinary incontinence    Urolithiasis     Past Surgical History:  Procedure Laterality Date   ABDOMINAL SURGERY     CARDIAC CATHETERIZATION N/A 02/02/2016   Procedure: Left Heart Cath and Coronary Angiography;  Surgeon: Peter M Swaziland, MD;  Location: [facility] INVASIVE CV LAB;  Service: Cardiovascular;  Laterality: N/A;   CHOLECYSTECTOMY  1995   ESOPHAGOGASTRODUODENOSCOPY  06/08/2002   GASTRECTOMY N/A 07/20/2014    Procedure: PROXIMAL GASTRECTOMY;  Surgeon: Almond Lint, MD;  Location: [facility] OR;  Service: General;  Laterality: N/A;   GASTROJEJUNOSTOMY N/A 07/20/2014   Procedure: GASTROJEJUNOSTOMY;  Surgeon: Almond Lint, MD;  Location: [facility] OR;  Service: General;  Laterality: N/A;   LAPAROSCOPY N/A 07/20/2014   Procedure: LAPAROSCOPY DIAGNOSTIC;  Surgeon: Almond Lint, MD;  Location: [facility] OR;  Service: General;  Laterality: N/A;    Family History  Problem Relation Age of Onset   Cancer Father        Prostate Cancer   Cancer Sister        Breast Cancer   Diabetes Sister    Diabetes Sister    Other Mother        pneumonia    Cancer Sister        breast cancer     Social History   Socioeconomic History   Marital status: Married    Spouse name: Not on file   Number of children: Not on file   Years of education: Not on file   Highest education level: Not on file  Occupational History   Occupation: Retired    Associate Professor: RETIRED  Tobacco Use   Smoking status: Never   Smokeless tobacco: Never  Vaping Use   Vaping Use: Never used  Substance and Sexual Activity   Alcohol use: No    Alcohol/week: 0.0 standard  drinks   Drug use: No   Sexual activity: Not Currently    Birth control/protection: Post-menopausal  Other Topics Concern   Not on file  Social History Narrative   Retired-Domestic Assistant   Right handed    Lives with husband    Social Determinants of Health   Financial Resource Strain: Low Risk    Difficulty of Paying Living Expenses: Not hard at all  Food Insecurity: No Food Insecurity   Worried About Charity fundraiser in the Last Year: Never true   Arboriculturist in the Last Year: Never true  Transportation Needs: No Transportation Needs   Lack of Transportation (Medical): No   Lack of Transportation (Non-Medical): No  Physical Activity: Inactive   Days of Exercise per Week: 0 days   Minutes of Exercise per Session: 0 min  Stress: No Stress Concern Present   Feeling of  Stress : Not at all  Social Connections: Moderately Integrated   Frequency of Communication with Friends and Family: More than three times a week   Frequency of Social Gatherings with Friends and Family: More than three times a week   Attends Religious Services: More than 4 times per year   Active Member of [employer] or Organizations: No   Attends Archivist Meetings: Never   Marital Status: Married  Human resources officer Violence: Not At Risk   Fear of Current or Ex-Partner: No   Emotionally Abused: No   Physically Abused: No   Sexually Abused: No    Outpatient Medications Prior to Visit  Medication Sig Dispense Refill   Acetaminophen (TYLENOL PO) Take 500 mg by mouth 2 (two) times daily.      calcium carbonate (TUMS - DOSED IN MG ELEMENTAL CALCIUM) 500 MG chewable tablet Chew 1 tablet by mouth daily.     cholecalciferol (VITAMIN D3) 25 MCG (1000 UT) tablet Take 1,000 Units by mouth daily.     clopidogrel (PLAVIX) 75 MG tablet Take 1 tablet (75 mg total) by mouth daily. 90 tablet 2   donepezil (ARICEPT) 10 MG tablet Take 1 tablet daily 90 tablet 3   esomeprazole (NEXIUM) 40 MG capsule Take one pill daily for stomach 90 capsule 3   glucose blood (ONETOUCH VERIO) test strip CHECK BLOOD SUGAR TWICE DAILY 200 strip 0   insulin aspart protamine - aspart (NOVOLOG MIX 70/30 FLEXPEN) (70-30) 100 UNIT/ML FlexPen 16 units with breakfast, and 8 units with supper, and pen needles 2/day 30 mL 3   meclizine (ANTIVERT) 25 MG tablet TAKE 1 TABLET BY MOUTH THREE TIMES DAILY IF NEEDED FOR DIZZINESS 30 tablet 0   melatonin 3 MG TABS tablet Take 3 mg by mouth at bedtime.     mirtazapine (REMERON) 15 MG tablet TAKE 1 TABLET BY MOUTH EACH NIGHT AT BEDTIME AS DIRECTED 90 tablet 3   rosuvastatin (CRESTOR) 5 MG tablet Take 1 tablet (5 mg total) by mouth daily. 90 tablet 3   No facility-administered medications prior to visit.    Allergies  Allergen Reactions   Pirfenidone Diarrhea and Nausea And Vomiting    Aspirin     rash    Review of Systems REFER TO HPI FOR PERTINENT POSITIVES AND NEGATIVES     Objective:    Physical Exam Vitals and nursing note reviewed.  Constitutional:      General: She is not in acute distress.    Appearance: Normal appearance. She is not ill-appearing.  HENT:     Head: Normocephalic and atraumatic.  Cardiovascular:     Rate and Rhythm: Normal rate and regular rhythm.     Pulses: Normal pulses.     Heart sounds: Normal heart sounds.  Pulmonary:     Effort: Pulmonary effort is normal.     Breath sounds: Normal breath sounds.  Abdominal:     General: Abdomen is flat. Bowel sounds are normal.     Palpations: Abdomen is soft.     Tenderness: There is no right CVA tenderness or left CVA tenderness.  Musculoskeletal:     Lumbar back: Negative right straight leg raise test and negative left straight leg raise test.     Comments: +lumbar paraspinal tenderness to palpation  Skin:    General: Skin is warm and dry.  Neurological:     General: No focal deficit present.     Mental Status: She is alert.  Psychiatric:        Mood and Affect: Mood normal.    BP (!) 162/76   Pulse (!) 53   Temp 98.1 F (36.7 C)   Ht _0  (1.549 m)   Wt 128 lb 3.2 oz (58.2 kg)   SpO2 98%   BMI 24.22 kg/m  Wt Readings from Last 3 Encounters:  03/14/21 128 lb 3.2 oz (58.2 kg)  02/05/21 123 lb 3.2 oz (55.9 kg)  01/29/21 125 lb 3.2 oz (56.8 kg)    Health Maintenance Due  Topic Date Due   Zoster Vaccines- Shingrix (1 of 2) Never done   URINE MICROALBUMIN  03/18/2020   COVID-19 Vaccine (4 - Booster for Moderna series) 08/09/2020   OPHTHALMOLOGY EXAM  02/06/2021   INFLUENZA VACCINE  02/12/2021    There are no preventive care reminders to display for this patient.   Lab Results  Component Value Date   TSH 0.69 06/21/2020   Lab Results  Component Value Date   WBC 5.3 09/07/2020   HGB 12.0 09/07/2020   HCT 36.2 09/07/2020   MCV 90.5 09/07/2020   PLT 271.0  09/07/2020   Lab Results  Component Value Date   NA 140 09/07/2020   K 4.3 09/07/2020   CHLORIDE 102 11/07/2014   CO2 31 09/07/2020   GLUCOSE 81 09/07/2020   BUN 13 09/07/2020   CREATININE 0.93 09/07/2020   BILITOT 0.3 09/07/2020   ALKPHOS 71 09/07/2020   AST 32 09/07/2020   ALT 33 09/07/2020   PROT 7.9 09/07/2020   ALBUMIN 3.8 09/07/2020   CALCIUM 10.5 09/07/2020   ANIONGAP 11 11/24/2016   EGFR 80 (L) 11/07/2014   GFR 56.09 (L) 09/07/2020   Lab Results  Component Value Date   CHOL 118 09/20/2019   Lab Results  Component Value Date   HDL 42.00 09/20/2019   Lab Results  Component Value Date   LDLCALC 52 09/20/2019   Lab Results  Component Value Date   TRIG 119.0 09/20/2019   Lab Results  Component Value Date   CHOLHDL 3 09/20/2019   Lab Results  Component Value Date   HGBA1C 7.8 (A) 01/29/2021       Assessment & Plan:   Problem List Items Addressed This Visit       Other   BACK PAIN, LUMBAR - Primary   Relevant Medications   predniSONE (DELTASONE) 20 MG tablet   Other Relevant Orders   POCT urinalysis dipstick (Completed)     Meds ordered this encounter  Medications   predniSONE (DELTASONE) 20 MG tablet    Sig: Take 1 tablet (20 mg total)  by mouth 2 (two) times daily with a meal for 5 days.    Dispense:  10 tablet    Refill:  0   PLAN -UA clear, reassured patient -No red flags on exam -Most likely MSK -Will start on prednisone at this time (cannot do NSAIDs due to Plavix use) -Cautioned with monitoring sugars -Tylenol, ice / heat / rest -She will call with updates and recheck prn   Val Farnam M Taelyr Jantz, PA-C

## 2021-03-21 ENCOUNTER — Encounter: Payer: Self-pay | Admitting: Physician Assistant

## 2021-03-21 ENCOUNTER — Other Ambulatory Visit: Payer: Self-pay

## 2021-03-21 ENCOUNTER — Ambulatory Visit (INDEPENDENT_AMBULATORY_CARE_PROVIDER_SITE_OTHER): Payer: Medicare (Managed Care) | Admitting: Physician Assistant

## 2021-03-21 VITALS — BP 165/74 | HR 66 | Resp 20 | Ht 60.0 in | Wt 122.0 lb

## 2021-03-21 DIAGNOSIS — F0390 Unspecified dementia without behavioral disturbance: Secondary | ICD-10-CM

## 2021-03-21 DIAGNOSIS — F03A0 Unspecified dementia, mild, without behavioral disturbance, psychotic disturbance, mood disturbance, and anxiety: Secondary | ICD-10-CM

## 2021-03-21 DIAGNOSIS — G309 Alzheimer's disease, unspecified: Secondary | ICD-10-CM | POA: Insufficient documentation

## 2021-03-21 MED ORDER — DONEPEZIL HCL 10 MG PO TABS: ORAL_TABLET | ORAL | 3 refills | Status: DC

## 2021-03-21 NOTE — Patient Instructions (Signed)
Good to see you!  Continue Donepezil '10mg'$  daily.  Continue close supervision, monitor driving.  Follow-up in 6-8 months, call for any changes.  FALL PRECAUTIONS: Be cautious when walking. Scan the area for obstacles that may increase the risk of trips and falls. When getting up in the mornings, sit up at the edge of the bed for a few minutes before getting out of bed. Consider elevating the bed at the head end to avoid drop of blood pressure when getting up. Walk always in a well-lit room (use night lights in the walls). Avoid area rugs or power cords from appliances in the middle of the walkways. Use a walker or a cane if necessary and consider physical therapy for balance exercise. Get your eyesight checked regularly.  FINANCIAL OVERSIGHT: Supervision, especially oversight when making financial decisions or transactions is also recommended.  HOME SAFETY: Consider the safety of the kitchen when operating appliances like stoves, microwave oven, and blender. Consider having supervision and share cooking responsibilities until no longer able to participate in those. Accidents with firearms and other hazards in the house should be identified and addressed as well.  DRIVING: Regarding driving, in patients with progressive memory problems, driving will be impaired. We advise to have someone else do the driving if trouble finding directions or if minor accidents are reported. Independent driving assessment is available to determine safety of driving.  ABILITY TO BE LEFT ALONE: If patient is unable to contact 911 operator, consider using LifeLine, or when the need is there, arrange for someone to stay with patients. Smoking is a fire hazard, consider supervision or cessation. Risk of wandering should be assessed by caregiver and if detected at any point, supervision and safe proof recommendations should be instituted.  MEDICATION SUPERVISION: Inability to self-administer medication needs to be constantly  addressed. Implement a mechanism to ensure safe administration of the medications.  RECOMMENDATIONS FOR ALL PATIENTS WITH MEMORY PROBLEMS: 1. Continue to exercise (Recommend 30 minutes of walking everyday, or 3 hours every week) 2. Increase social interactions - continue going to [place] and enjoy social gatherings with friends and family 3. Eat healthy, avoid fried foods and eat more fruits and vegetables 4. Maintain adequate blood pressure, blood sugar, and blood cholesterol level. Reducing the risk of stroke and cardiovascular disease also helps promoting better memory. 5. Avoid stressful situations. Live a simple life and avoid aggravations. Organize your time and prepare for the next day in anticipation. 6. Sleep well, avoid any interruptions of sleep and avoid any distractions in the bedroom that may interfere with adequate sleep quality 7. Avoid sugar, avoid sweets as there is a strong link between excessive sugar intake, diabetes, and cognitive impairment We discussed the Mediterranean diet, which has been shown to help patients reduce the risk of progressive memory disorders and reduces cardiovascular risk. This includes eating fish, eat fruits and green leafy vegetables, nuts like almonds and hazelnuts, walnuts, and also use olive oil. Avoid fast foods and fried foods as much as possible. Avoid sweets and sugar as sugar use has been linked to worsening of memory function.  There is always a concern of gradual progression of memory problems. If this is the case, then we may need to adjust level of care according to patient needs. Support, both to the patient and caregiver, should then be put into place.

## 2021-03-21 NOTE — Progress Notes (Signed)
Assessment/Plan:    Mild Dementia, likely due to Alzheimer's Disease  MMSE today is 24/30 with delayed recall 3/3 improved since her last visit in 06/2020 at 21/30  She is on Donepezil 3m daily which she is tolerating without side effects   Recommendations:  Discussed safety both in and out of the home.  Discussed the importance of regular daily schedule with inclusion of crossword puzzles to maintain brain function.  Continue to monitor mood by PCP Stay active at least 30 minutes at least 3 times a week.  Naps should be scheduled and should be no longer than 60 minutes and should not occur after 2 PM.  Mediterranean diet is recommended  Continue donepezil 10 mg daily Side effects were discussed Continue to monitor driving Follow up in 6-8  months.   Case discussed with Dr. ADelice Leschwho agrees with the plan     Subjective:   ED visits since last seen: none  Hospital admissions: none  Tammy STANKIEWICZis a 87y.o. female with a history of  hypertension, hyperlipidemia, diabetes, gastric cancer sp chemo/XRT, anxiety, depression and IPF seen today in a follow-up evaluation for mild dementia, likely due to Alzheimer's disease. This patient is accompanied in the office by her husband who supplements the history.  Previous records as well as any outside records available were reviewed prior to todays visit.  She was last seen at our clinic on 07/13/2020.   She is currently on donepezil 10 mg daily, tolerating well. She reports feeling well, stating that her memory is "about the same ". Her granddaughter bought her Scrabble and she enjoys playing.  She continues to enjoy gardening and walking short distances. She enjoys cooking denies leaving the stove on.  [name] Hughes her pillbox for her to take the medications by herself, and denies forgetting doses.  She is independent with dressing and bathing.  She sleeps well as long as she takes mirtazapine 50 mg nightly and melatonin.   She denies any personality changes, sleepwalking, paranoia or hallucinations.  She denies any falls, and walks without the assistance of a cane or walker.  She continues to drive very short distances, only once or twice a week and denies getting lost.  Her husband took over the finances for years ago.  Her appetite is good and denies trouble swallowing. She has infrequent headaches, and having had an episode today, short lived, treated with Tylenol.  She denies any dizziness or vertigo, focal numbness or tingling, unilateral weakness or tremors.  She has very mild incontinence of urine, and uses a pad.  She denies any constipation or diarrhea.     History on Initial Assessment 12/28/2019: This is an 87year old right-handed woman with a history of hypertension, hyperlipidemia, diabetes, stomach cancer, anxiety, depression, presenting for evaluation of memory loss. [name] Hughes present to provide additional information. She states "I just can't remember." Tammy Hughes started noticing changes around 4 years ago after she underwent chemotherapy and radiation treatment, worse in the past year. She repeats herself several times. She lives with her husband. She was diagnosed with cancer 4 years ago and was forgetting bill payments, her husband took over at that time. She denies getting lost driving. She forgets her medications, they had to count her pills and find that she has not taken them as instructed. She denies leaving the stove on but Tammy Hughes reports she has burned food, thinking she turned the burner off. She denies misplacing things. She  is independent with dressing and bathing. Her 2 sisters and brother had Alzheimer's disease. No concussions or alcohol use. MMSE 24/30 in PCP office last 09/2019.   She has infrequent headaches, occasional dizziness/vertigo, low back pain, urinary incontinence. She denies any diplopia, dysarthria/dysphagia, focal numbness/tingling/weakness, tremors, or anosmia. No falls.  Sleep is not good sometimes, she occasionally takes naps. No wandering behavior. Her daughter notes she is a little more irritable than before, she does not like a lot of people around. No hallucinations or paranoia.     Laboratory Data:      Lab Results  Component Value Date    TSH 0.69 06/21/2020         Lab Results  Component Value Date    VITAMINB12 1,317 (H) 07/05/2019     PREVIOUS MEDICATIONS:   CURRENT MEDICATIONS:  Outpatient Encounter Medications as of 03/21/2021  Medication Sig   Acetaminophen (TYLENOL PO) Take 500 mg by mouth 2 (two) times daily.    calcium carbonate (TUMS - DOSED IN MG ELEMENTAL CALCIUM) 500 MG chewable tablet Chew 1 tablet by mouth daily.   cholecalciferol (VITAMIN D3) 25 MCG (1000 UT) tablet Take 1,000 Units by mouth daily.   clopidogrel (PLAVIX) 75 MG tablet Take 1 tablet (75 mg total) by mouth daily.   esomeprazole (NEXIUM) 40 MG capsule Take one pill daily for stomach   glucose blood (ONETOUCH VERIO) test strip CHECK BLOOD SUGAR TWICE DAILY   insulin aspart protamine - aspart (NOVOLOG MIX 70/30 FLEXPEN) (70-30) 100 UNIT/ML FlexPen 16 units with breakfast, and 8 units with supper, and pen needles 2/day   meclizine (ANTIVERT) 25 MG tablet TAKE 1 TABLET BY MOUTH THREE TIMES DAILY IF NEEDED FOR DIZZINESS   melatonin 3 MG TABS tablet Take 3 mg by mouth at bedtime.   mirtazapine (REMERON) 15 MG tablet TAKE 1 TABLET BY MOUTH EACH NIGHT AT BEDTIME AS DIRECTED   rosuvastatin (CRESTOR) 5 MG tablet Take 1 tablet (5 mg total) by mouth daily.   [DISCONTINUED] donepezil (ARICEPT) 10 MG tablet Take 1 tablet daily   donepezil (ARICEPT) 10 MG tablet Take 1 tablet daily   No facility-administered encounter medications on file as of 03/21/2021.     Objective:     PHYSICAL EXAMINATION:    VITALS:   Vitals:   03/21/21 1251  BP: (!) 165/74  Pulse: 66  Resp: 20  SpO2: 97%  Weight: 122 lb (55.3 kg)  Height: 5' (1.524 m)    GEN:  The patient appears stated  age and is in NAD. HEENT:  Normocephalic, atraumatic.   Neurological examination:  General: NAD, well-groomed, appears stated age. Orientation: The patient is alert. Oriented to person, place and the date is 04/10/2021. Cranial nerves: There is good facial symmetry.The speech is fluent and clear but soft. No aphasia or dysarthria. Fund of knowledge is appropriate. Recent memory and remote memory are impaired. Attention and concentration are reduced, unable to correctly spell WORLD backwards (only W) Able to name objects and repeat phrases.  Hearing is intact to conversational tone.    Sensation: Sensation is intact to light touch throughout Motor: Strength is at least antigravity x4. Tremors: none  DTR's 2/4 in UE/LE    No flowsheet data found. MMSE - Mini Mental State Exam 03/21/2021 07/13/2020 09/20/2019  Orientation to time _0 Orientation to Place _1 Registration _2 Attention/ Calculation 1 0 3  Recall _3 Language- name 2 objects 2 2  2  Language- repeat 1 0 0  Language- follow 3 step command _0 Language- read & follow direction _1 Write a sentence _2 Copy design 0 0 1  Total score _3 [employer] Mental Exam 12/28/2019  Weekday Correct 1  Current year 1  What state are we in? 1  Amount spent 1  Amount left 0  # of Animals 1  5 objects recall 1  Number series 0  Hour markers 0  Time correct 0  Placed X in triangle correctly 1  Largest Figure 1  Name of female 2  Date back to work 0  Type of work 0  State she lived in 0  Total score 10       Movement examination: Tone: There is normal tone in the UE/LE Abnormal movements:  no tremor.  No myoclonus.  No asterixis.   Coordination:  There is no decremation with RAM's. Normal finger to nose  Gait and Station: The patient has no difficulty arising out of a deep-seated chair without the use of the hands. The patient's stride length is good.  Gait is cautious and narrow, favors R  leg, no ataxia .        Total time spent on today's visit was 30 minutes, including both face-to-face time and nonface-to-face time. Time included that spent on review of records (prior notes available to me/labs/imaging if pertinent), discussing treatment and goals, answering patient's questions and coordinating care.  Cc:  Vivi Barrack, MD Sharene Butters, PA-C

## 2021-03-22 ENCOUNTER — Encounter: Payer: Self-pay | Admitting: Nurse Practitioner

## 2021-03-22 ENCOUNTER — Inpatient Hospital Stay: Payer: Medicare (Managed Care) | Attending: Nurse Practitioner | Admitting: Nurse Practitioner

## 2021-03-22 VITALS — BP 138/64 | HR 82 | Temp 98.2°F | Resp 18 | Ht 60.0 in | Wt 122.2 lb

## 2021-03-22 DIAGNOSIS — Z923 Personal history of irradiation: Secondary | ICD-10-CM | POA: Diagnosis not present

## 2021-03-22 DIAGNOSIS — E119 Type 2 diabetes mellitus without complications: Secondary | ICD-10-CM | POA: Insufficient documentation

## 2021-03-22 DIAGNOSIS — Z85038 Personal history of other malignant neoplasm of large intestine: Secondary | ICD-10-CM | POA: Insufficient documentation

## 2021-03-22 DIAGNOSIS — C169 Malignant neoplasm of stomach, unspecified: Secondary | ICD-10-CM

## 2021-03-22 DIAGNOSIS — Z9221 Personal history of antineoplastic chemotherapy: Secondary | ICD-10-CM | POA: Insufficient documentation

## 2021-03-22 DIAGNOSIS — I252 Old myocardial infarction: Secondary | ICD-10-CM | POA: Diagnosis not present

## 2021-03-22 DIAGNOSIS — Z79899 Other long term (current) drug therapy: Secondary | ICD-10-CM | POA: Insufficient documentation

## 2021-03-22 DIAGNOSIS — Z86718 Personal history of other venous thrombosis and embolism: Secondary | ICD-10-CM | POA: Insufficient documentation

## 2021-03-22 DIAGNOSIS — F32A Depression, unspecified: Secondary | ICD-10-CM | POA: Diagnosis not present

## 2021-03-22 DIAGNOSIS — Z85028 Personal history of other malignant neoplasm of stomach: Secondary | ICD-10-CM | POA: Diagnosis present

## 2021-03-22 NOTE — Progress Notes (Signed)
  [place] OFFICE PROGRESS NOTE   Diagnosis: Gastric cancer  INTERVAL HISTORY:   Tammy Hughes returns as scheduled.  She feels well.  She has a good appetite.  She denies pain.  No difficulty swallowing.  No nausea or vomiting.  Bowels are moving.  Objective:  Vital signs in last 24 hours:  Blood pressure 138/64, pulse 82, temperature 98.2 F (36.8 C), temperature source Oral, resp. rate 18, height 5' (1.524 m), weight 122 lb 3.2 oz (55.4 kg), SpO2 100 %.    HEENT: No thrush or ulcers. Lymphatics: No palpable cervical, supraclavicular, axillary or inguinal lymph nodes. Resp: Inspiratory rales both lung bases.  No respiratory distress. Cardio: Regular rate and rhythm. GI: Abdomen soft and nontender.  No hepatosplenomegaly.  No mass. Vascular: No leg edema.   Lab Results:  Lab Results  Component Value Date   WBC 5.3 09/07/2020   HGB 12.0 09/07/2020   HCT 36.2 09/07/2020   MCV 90.5 09/07/2020   PLT 271.0 09/07/2020   NEUTROABS 2.5 09/07/2020    Imaging:  No results found.  Medications: I have reviewed the patient's current medications.  Assessment/Plan: Gastric cancer, status post an endoscopic biopsy of a lesser curvature mass on 05/31/2014 confirming adenocarcinoma Staging CTs of the chest and abdomen on 06/08/2014 revealed no evidence of metastatic disease Proximal gastrectomy and feeding jejunostomy 07/20/2014, pT2,pN1 Adjuvant weekly 5-Fu/leucovorin 09/14/2014, 09/21/2014, 09/28/2014 and 10/05/2014 Initiation of radiation and infusional 5-FU 10/17/2014 5-fluorouracil pump discontinued on 10/27/2014 due to toxicity. Radiation discontinued 11/02/2014    Pulmonary fibrosis Esophageal stricture, status post a dilatation procedure 05/31/2014 Diabetes Postoperative ventricle tachycardia, NSTEMI January 2016 Hospital-acquired pneumonia January 2016 CT abdomen/pelvis 09/02/2014 with postsurgical changes. Focal fluid collection in the midline abutting  the distal greater curvature of the stomach measuring 2.2 x 2.9 x 3.4 cm. Mild stranding in the adjacent fat. PICC placement 10/17/2014 Rash. Most likely related to the 5-fluorouracil. Resolved. Right upper extremity DVT 10/25/2014. She completed a course of Xarelto. Hospitalization 10/27/2014 through 10/28/2014 nausea/vomiting and left arm discomfort. Noted to have skin toxicity from the 5-fluorouracil. The 5-fluorouracil was discontinued. Depression. She continues Remeron.  Improved.    Disposition: Tammy Hughes remains in clinical remission from gastric cancer.  She is close to 7 years out from the initial diagnosis.  She would like to [facility] at the [facility].  She will return for an office visit in 9 months.  Patient seen with Dr. Benay Hughes.  Tammy Hughes ANP/GNP-BC   03/22/2021  10:26 AM  This was a shared visit with Tammy Hughes.  Tammy Hughes is in remission from gastric cancer.  She has a good prognosis for long-term disease-free survival.  I was present for greater than 50% of today's visit, I performed medical decision making.  Tammy Manson, MD

## 2021-04-30 ENCOUNTER — Other Ambulatory Visit: Payer: Self-pay

## 2021-04-30 ENCOUNTER — Other Ambulatory Visit: Payer: Self-pay | Admitting: Family Medicine

## 2021-04-30 DIAGNOSIS — Z794 Long term (current) use of insulin: Secondary | ICD-10-CM

## 2021-04-30 DIAGNOSIS — E119 Type 2 diabetes mellitus without complications: Secondary | ICD-10-CM

## 2021-04-30 MED ORDER — NOVOLOG MIX 70/30 FLEXPEN (70-30) 100 UNIT/ML ~~LOC~~ SUPN: PEN_INJECTOR | SUBCUTANEOUS | 3 refills | Status: DC

## 2021-05-03 ENCOUNTER — Telehealth (INDEPENDENT_AMBULATORY_CARE_PROVIDER_SITE_OTHER): Payer: Medicare (Managed Care) | Admitting: Family

## 2021-05-03 ENCOUNTER — Encounter: Payer: Self-pay | Admitting: Family

## 2021-05-03 DIAGNOSIS — U071 COVID-19: Secondary | ICD-10-CM | POA: Insufficient documentation

## 2021-05-03 MED ORDER — BENZONATATE 200 MG PO CAPS: 200.0000 mg | ORAL_CAPSULE | Freq: Three times a day (TID) | ORAL | 0 refills | Status: AC | PRN

## 2021-05-03 NOTE — Assessment & Plan Note (Signed)
Feeling better, but non-productive cough is lingering. Sending refill of tessalon pearles. Advised of CDC guidelines for self isolation/ ending isolation.  Advised of safe practice guidelines. Symptom Tier reviewed.  Encouraged to monitor for any worsening symptoms; watch for increased shortness of breath, weakness, and signs of dehydration. Advised when to seek emergency care.  Instructed to rest and hydrate well.  Advised to leave the house during recommended isolation period, only if it is necessary to seek medical care

## 2021-05-03 NOTE — Progress Notes (Signed)
MyChart Video Visit    Virtual Visit via Video Note   This visit type was conducted due to national recommendations for restrictions regarding the COVID-19 Pandemic (e.g. social distancing) in an effort to limit this patient's exposure and mitigate transmission in our community. This patient is at least at moderate risk for complications without adequate follow up. This format is felt to be most appropriate for this patient at this time. Physical exam was limited by quality of the video and audio technology used for the visit. CMA was able to get the patient set up on a video visit.  Patient location: Home. Patient and provider in visit Provider location: Office  I discussed the limitations of evaluation and management by telemedicine and the availability of in person appointments. The patient expressed understanding and agreed to proceed.  Visit Date: 05/03/2021  Today's healthcare provider: Jeanie Sewer, NP     Subjective:    Patient ID: Tammy Hughes, female    DOB: [birth year], 87 y.o.   MRN: [id number]  Chief Complaint  Patient presents with   Covid Positive    Symptoms started Last Thursday/Friday. Covid Test positive on Monday in ED. She has taken cough medicine previously prescribed. It has helped the cough, pt's daughter is requesting a refill.   Cough   Nasal Congestion    Cough  Covid19 positive: Patient complains of symptoms of cough. Symptoms include congestion. Onset of symptoms was 7 days ago, gradually improving since that time. She also c/o no  fever and non productive cough, and nasal congestion for the past 7 days .  She is drinking plenty of fluids. Evaluation to date: seen previously in the ER Monday and tested + for covid and given IV fluids, no antiviral med.  Treatment to date: cough suppressants.    Past Medical History:  Diagnosis Date   Allergy    SEASONAL   ANXIETY 03/08/2008   Cataract    REMOVED BILATERAL   Depression     DIVERTICULOSIS, COLON 03/08/2008   DVT (deep venous thrombosis) ([place])    GERD 02/23/2007   HYPERCHOLESTEROLEMIA 02/01/2008   HYPERTENSION 03/08/2008   Insulin dependent diabetes mellitus    OSTEOARTHRITIS 02/23/2007   OSTEOPOROSIS 02/23/2007   PERIPHERAL NEUROPATHY 02/23/2007   STD (sexually transmitted disease)    Stomach cancer ([place]) 07/20/14   invasive adenocarcinoma w/signet rings features   Urinary incontinence    Urolithiasis     Past Surgical History:  Procedure Laterality Date   ABDOMINAL SURGERY     CARDIAC CATHETERIZATION N/A 02/02/2016   Procedure: Left Heart Cath and Coronary Angiography;  Surgeon: Peter M Martinique, MD;  Location: [place] [facility];  Service: Cardiovascular;  Laterality: N/A;   CHOLECYSTECTOMY  1995   ESOPHAGOGASTRODUODENOSCOPY  06/08/2002   GASTRECTOMY N/A 07/20/2014   Procedure: PROXIMAL GASTRECTOMY;  Surgeon: Stark Klein, MD;  Location: [place];  Service: General;  Laterality: N/A;   GASTROJEJUNOSTOMY N/A 07/20/2014   Procedure: GASTROJEJUNOSTOMY;  Surgeon: Stark Klein, MD;  Location: [place];  Service: General;  Laterality: N/A;   LAPAROSCOPY N/A 07/20/2014   Procedure: LAPAROSCOPY DIAGNOSTIC;  Surgeon: Stark Klein, MD;  Location: [place];  Service: General;  Laterality: N/A;    Outpatient Medications Prior to Visit  Medication Sig Dispense Refill   Acetaminophen (TYLENOL PO) Take 500 mg by mouth 2 (two) times daily.      calcium carbonate (TUMS - DOSED IN MG ELEMENTAL CALCIUM) 500 MG chewable tablet Chew 1 tablet by mouth daily.  cholecalciferol (VITAMIN D3) 25 MCG (1000 UT) tablet Take 1,000 Units by mouth daily.     clopidogrel (PLAVIX) 75 MG tablet Take 1 tablet (75 mg total) by mouth daily. 90 tablet 2   donepezil (ARICEPT) 10 MG tablet Take 1 tablet daily 90 tablet 3   esomeprazole (NEXIUM) 40 MG capsule Take one pill daily for stomach 90 capsule 3   glucose blood (ONETOUCH VERIO) test strip CHECK BLOOD SUGAR TWICE DAILY 200 strip 0   insulin aspart  protamine - aspart (NOVOLOG MIX 70/30 FLEXPEN) (70-30) 100 UNIT/ML FlexPen 16 units with breakfast, and 8 units with supper, and pen needles 2/day 30 mL 3   meclizine (ANTIVERT) 25 MG tablet TAKE 1 TABLET BY MOUTH THREE TIMES DAILY IF NEEDED FOR DIZZINESS 30 tablet 0   melatonin 3 MG TABS tablet Take 3 mg by mouth at bedtime.     mirtazapine (REMERON) 15 MG tablet TAKE 1 TABLET BY MOUTH EACH NIGHT AT BEDTIME AS DIRECTED 90 tablet 3   phenazopyridine (PYRIDIUM) 100 MG tablet Take 100 mg by mouth 3 (three) times daily.     rosuvastatin (CRESTOR) 5 MG tablet Take 1 tablet (5 mg total) by mouth daily. 90 tablet 3   No facility-administered medications prior to visit.    Allergies  Allergen Reactions   Pirfenidone Diarrhea and Nausea And Vomiting   Aspirin     rash        Objective:    Physical Exam Vitals and nursing note reviewed.  Constitutional:      General: She is not in acute distress.    Appearance: Normal appearance.  HENT:     Head: Normocephalic.  Pulmonary:     Effort: No respiratory distress.  Musculoskeletal:     Cervical back: Normal range of motion.  Skin:    General: Skin is dry.     Coloration: Skin is not pale.  Neurological:     Mental Status: She is alert and oriented to person, place, and time.  Psychiatric:        Mood and Affect: Mood normal.    Ht 5' (1.524 m)   Wt 122 lb 2.2 oz (55.4 kg)   BMI 23.85 kg/m   Wt Readings from Last 3 Encounters:  05/03/21 122 lb 2.2 oz (55.4 kg)  03/22/21 122 lb 3.2 oz (55.4 kg)  03/21/21 122 lb (55.3 kg)       Assessment & Plan:   Problem List Items Addressed This Visit       Other   COVID-19    Feeling better, but non-productive cough is lingering. Sending refill of tessalon pearles. Advised of CDC guidelines for self isolation/ ending isolation.  Advised of safe practice guidelines. Symptom Tier reviewed.  Encouraged to monitor for any worsening symptoms; watch for increased shortness of breath,  weakness, and signs of dehydration. Advised when to seek emergency care.  Instructed to rest and hydrate well.  Advised to leave the house during recommended isolation period, only if it is necessary to seek medical care      Relevant Medications   benzonatate (TESSALON) 200 MG capsule    I am having [place] Charon. Tammy Hughes start on benzonatate. I am also having her maintain her calcium carbonate, Acetaminophen (TYLENOL PO), cholecalciferol, meclizine, clopidogrel, mirtazapine, rosuvastatin, esomeprazole, melatonin, OneTouch Verio, donepezil, phenazopyridine, and NovoLOG Mix 70/30 FlexPen.  Meds ordered this encounter  Medications   benzonatate (TESSALON) 200 MG capsule    Sig: Take 1 capsule (200 mg total) by  mouth 3 (three) times daily as needed for up to 10 days for cough.    Dispense:  30 capsule    Refill:  0    Order Specific Question:   Supervising Provider    Answer:   ANDY, CAMILLE L [[id number]]    I discussed the assessment and treatment plan with the patient. The patient was provided an opportunity to ask questions and all were answered. The patient agreed with the plan and demonstrated an understanding of the instructions.   The patient was advised to call back or seek an in-person evaluation if the symptoms worsen or if the condition fails to improve as anticipated.  I provided 23 minutes of face-to-face time during this encounter.   Jeanie Sewer, NP [facility] [phone] (phone) [phone] (fax)  [place]

## 2021-05-10 ENCOUNTER — Other Ambulatory Visit: Payer: Self-pay | Admitting: Endocrinology

## 2021-05-10 DIAGNOSIS — Z794 Long term (current) use of insulin: Secondary | ICD-10-CM

## 2021-05-10 DIAGNOSIS — E119 Type 2 diabetes mellitus without complications: Secondary | ICD-10-CM

## 2021-05-17 ENCOUNTER — Telehealth: Payer: Medicare (Managed Care) | Admitting: Internal Medicine

## 2021-06-04 ENCOUNTER — Ambulatory Visit (INDEPENDENT_AMBULATORY_CARE_PROVIDER_SITE_OTHER): Payer: Medicare (Managed Care) | Admitting: Endocrinology

## 2021-06-04 ENCOUNTER — Other Ambulatory Visit: Payer: Self-pay

## 2021-06-04 VITALS — BP 134/80 | HR 60 | Ht 60.0 in | Wt 124.2 lb

## 2021-06-04 DIAGNOSIS — E1142 Type 2 diabetes mellitus with diabetic polyneuropathy: Secondary | ICD-10-CM | POA: Diagnosis not present

## 2021-06-04 DIAGNOSIS — E119 Type 2 diabetes mellitus without complications: Secondary | ICD-10-CM | POA: Diagnosis not present

## 2021-06-04 DIAGNOSIS — Z23 Encounter for immunization: Secondary | ICD-10-CM | POA: Diagnosis not present

## 2021-06-04 DIAGNOSIS — Z794 Long term (current) use of insulin: Secondary | ICD-10-CM | POA: Diagnosis not present

## 2021-06-04 LAB — POCT GLYCOSYLATED HEMOGLOBIN (HGB A1C): Hemoglobin A1C: 7.9 % — AB (ref 4.0–5.6)

## 2021-06-04 NOTE — Progress Notes (Signed)
Subjective:    Patient ID: Tammy Hughes, female    DOB: 1932/10/26, 87 y.o.   MRN: [id number]  HPI Pt returns for f/u of diabetes mellitus:  DM type: Insulin-requiring type 2.  Dx'ed: 4665.   Complications: PN and CAD.  Therapy: insulin since 2005.  GDM: never.   DKA: never.   Severe hypoglycemia: never.   Pancreatitis: never.  Other: she chose bid premixed insulin; she declines non-insulin rx Interval history: He brings his meter with his cbg's which I have reviewed today.  cbg varies from 102-403.  There is no trend throughout the day.   Past Medical History:  Diagnosis Date   Allergy    SEASONAL   ANXIETY 03/08/2008   Cataract    REMOVED BILATERAL   Depression    DIVERTICULOSIS, COLON 03/08/2008   DVT (deep venous thrombosis) ([place])    GERD 02/23/2007   HYPERCHOLESTEROLEMIA 02/01/2008   HYPERTENSION 03/08/2008   Insulin dependent diabetes mellitus    OSTEOARTHRITIS 02/23/2007   OSTEOPOROSIS 02/23/2007   PERIPHERAL NEUROPATHY 02/23/2007   STD (sexually transmitted disease)    Stomach cancer ([place]) 07/20/14   invasive adenocarcinoma w/signet rings features   Urinary incontinence    Urolithiasis     Past Surgical History:  Procedure Laterality Date   ABDOMINAL SURGERY     CARDIAC CATHETERIZATION N/A 02/02/2016   Procedure: Left Heart Cath and Coronary Angiography;  Surgeon: Peter M Martinique, MD;  Location: [place] [facility];  Service: Cardiovascular;  Laterality: N/A;   CHOLECYSTECTOMY  1995   ESOPHAGOGASTRODUODENOSCOPY  06/08/2002   GASTRECTOMY N/A 07/20/2014   Procedure: PROXIMAL GASTRECTOMY;  Surgeon: Stark Klein, MD;  Location: [place];  Service: General;  Laterality: N/A;   GASTROJEJUNOSTOMY N/A 07/20/2014   Procedure: GASTROJEJUNOSTOMY;  Surgeon: Stark Klein, MD;  Location: [place];  Service: General;  Laterality: N/A;   LAPAROSCOPY N/A 07/20/2014   Procedure: LAPAROSCOPY DIAGNOSTIC;  Surgeon: Stark Klein, MD;  Location: [place];  Service: General;  Laterality: N/A;     Social History   Socioeconomic History   Marital status: Married    Spouse name: Not on file   Number of children: Not on file   Years of education: Not on file   Highest education level: Not on file  Occupational History   Occupation: Retired    Employer: RETIRED  Tobacco Use   Smoking status: Never   Smokeless tobacco: Never  Vaping Use   Vaping Use: Never used  Substance and Sexual Activity   Alcohol use: No    Alcohol/week: 0.0 standard drinks   Drug use: No   Sexual activity: Not Currently    Birth control/protection: Post-menopausal  Other Topics Concern   Not on file  Social History Narrative   Retired-Domestic Assistant   Right handed    Lives with husband    Social Determinants of Health   Financial Resource Strain: Low Risk    Difficulty of Paying Living Expenses: Not hard at all  Food Insecurity: No Food Insecurity   Worried About Charity fundraiser in the Last Year: Never true   Arboriculturist in the Last Year: Never true  Transportation Needs: No Transportation Needs   Lack of Transportation (Medical): No   Lack of Transportation (Non-Medical): No  Physical Activity: Inactive   Days of Exercise per Week: 0 days   Minutes of Exercise per Session: 0 min  Stress: No Stress Concern Present   Feeling of Stress : Not at all  Social Connections: Moderately Integrated   Frequency of Communication with Friends and Family: More than three times a week   Frequency of Social Gatherings with Friends and Family: More than three times a week   Attends Religious Services: More than 4 times per year   Active Member of [employer] or Organizations: No   Attends Archivist Meetings: Never   Marital Status: Married  Human resources officer Violence: Not At Risk   Fear of Current or Ex-Partner: No   Emotionally Abused: No   Physically Abused: No   Sexually Abused: No    Current Outpatient Medications on File Prior to Visit  Medication Sig Dispense Refill    Acetaminophen (TYLENOL PO) Take 500 mg by mouth 2 (two) times daily.      calcium carbonate (TUMS - DOSED IN MG ELEMENTAL CALCIUM) 500 MG chewable tablet Chew 1 tablet by mouth daily.     cholecalciferol (VITAMIN D3) 25 MCG (1000 UT) tablet Take 1,000 Units by mouth daily.     clopidogrel (PLAVIX) 75 MG tablet Take 1 tablet (75 mg total) by mouth daily. 90 tablet 2   donepezil (ARICEPT) 10 MG tablet Take 1 tablet daily 90 tablet 3   esomeprazole (NEXIUM) 40 MG capsule Take one pill daily for stomach 90 capsule 3   meclizine (ANTIVERT) 25 MG tablet TAKE 1 TABLET BY MOUTH THREE TIMES DAILY IF NEEDED FOR DIZZINESS 30 tablet 0   melatonin 3 MG TABS tablet Take 3 mg by mouth at bedtime.     mirtazapine (REMERON) 15 MG tablet TAKE 1 TABLET BY MOUTH EACH NIGHT AT BEDTIME AS DIRECTED 90 tablet 3   ONETOUCH VERIO test strip CHECK BLOOD SUGAR TWICE DAILY 200 strip 0   phenazopyridine (PYRIDIUM) 100 MG tablet Take 100 mg by mouth 3 (three) times daily.     rosuvastatin (CRESTOR) 5 MG tablet Take 1 tablet (5 mg total) by mouth daily. 90 tablet 3   No current facility-administered medications on file prior to visit.    Allergies  Allergen Reactions   Pirfenidone Diarrhea and Nausea And Vomiting   Aspirin     rash    Family History  Problem Relation Age of Onset   Cancer Father        Prostate Cancer   Cancer Sister        Breast Cancer   Diabetes Sister    Diabetes Sister    Other Mother        pneumonia    Cancer Sister        breast cancer     BP 134/80 (BP Location: Left Arm, Patient Position: Sitting, Cuff Size: Normal)   Pulse 60   Ht 5' (1.524 m)   Wt 124 lb 3.2 oz (56.3 kg)   SpO2 96%   BMI 24.26 kg/m    Review of Systems She denies hypoglycemia.      Objective:   Physical Exam Pulses: dorsalis pedis intact bilat.   MSK: no deformity of the feet.  CV: 1+ right, and trace left, leg edema.   Skin:  no ulcer on the feet.  normal color and temp on the feet.   Neuro:  sensation is intact to touch on the feet.   Lab Results  Component Value Date   HGBA1C 7.9 (A) 06/04/2021        Assessment & Plan:  Insulin-requiring type 2 DM: uncontrolled.  I advised her to add non-insulin rx, but she declines  Patient Instructions  Please continue  the same insulin.   check your blood sugar twice a day.  vary the time of day when you check, between before the 3 meals, and at bedtime.  also check if you have symptoms of your blood sugar being too high or too low.  please keep a record of the readings and bring it to your next appointment here (or you can bring the meter itself).  You can write it on any piece of paper.  please call us sooner if your blood sugar goes below 70, or if most of your readings are over 200.  Please come back for a follow-up appointment in 4 months.

## 2021-06-04 NOTE — Patient Instructions (Signed)
Please continue the same insulin.   check your blood sugar twice a day.  vary the time of day when you check, between before the 3 meals, and at bedtime.  also check if you have symptoms of your blood sugar being too high or too low.  please keep a record of the readings and bring it to your next appointment here (or you can bring the meter itself).  You can write it on any piece of paper.  please call us sooner if your blood sugar goes below 70, or if most of your readings are over 200.  Please come back for a follow-up appointment in 4 months.

## 2021-06-06 ENCOUNTER — Other Ambulatory Visit: Payer: Self-pay | Admitting: Family Medicine

## 2021-06-06 DIAGNOSIS — E119 Type 2 diabetes mellitus without complications: Secondary | ICD-10-CM

## 2021-06-06 DIAGNOSIS — Z794 Long term (current) use of insulin: Secondary | ICD-10-CM

## 2021-06-11 ENCOUNTER — Other Ambulatory Visit: Payer: Self-pay | Admitting: Family Medicine

## 2021-06-27 ENCOUNTER — Telehealth: Payer: Self-pay | Admitting: Family Medicine

## 2021-06-27 NOTE — Progress Notes (Signed)
Cardiology Clinic Note   Patient Name: Tammy Hughes Date of Encounter: 06/28/2021  Primary Care Provider:  Vivi Barrack, MD Primary Cardiologist:  Elouise Munroe, MD  Patient Profile    Tammy Hughes is a 87 year old female presents to the clinic today for follow-up evaluation of her dizziness, essential hypertension, and bradycardia.  Past Medical History    Past Medical History:  Diagnosis Date   Allergy    SEASONAL   ANXIETY 03/08/2008   Cataract    REMOVED BILATERAL   Depression    DIVERTICULOSIS, COLON 03/08/2008   DVT (deep venous thrombosis) ([place])    GERD 02/23/2007   HYPERCHOLESTEROLEMIA 02/01/2008   HYPERTENSION 03/08/2008   Insulin dependent diabetes mellitus    OSTEOARTHRITIS 02/23/2007   OSTEOPOROSIS 02/23/2007   PERIPHERAL NEUROPATHY 02/23/2007   STD (sexually transmitted disease)    Stomach cancer ([place]) 07/20/14   invasive adenocarcinoma w/signet rings features   Urinary incontinence    Urolithiasis    Past Surgical History:  Procedure Laterality Date   ABDOMINAL SURGERY     CARDIAC CATHETERIZATION N/A 02/02/2016   Procedure: Left Heart Cath and Coronary Angiography;  Surgeon: Peter M Martinique, MD;  Location: [facility];  Service: Cardiovascular;  Laterality: N/A;   CHOLECYSTECTOMY  1995   ESOPHAGOGASTRODUODENOSCOPY  06/08/2002   GASTRECTOMY N/A 07/20/2014   Procedure: PROXIMAL GASTRECTOMY;  Surgeon: Stark Klein, MD;  Location: [facility];  Service: General;  Laterality: N/A;   GASTROJEJUNOSTOMY N/A 07/20/2014   Procedure: GASTROJEJUNOSTOMY;  Surgeon: Stark Klein, MD;  Location: [facility];  Service: General;  Laterality: N/A;   LAPAROSCOPY N/A 07/20/2014   Procedure: LAPAROSCOPY DIAGNOSTIC;  Surgeon: Stark Klein, MD;  Location: [facility];  Service: General;  Laterality: N/A;    Allergies  Allergies  Allergen Reactions   Pirfenidone Diarrhea and Nausea And Vomiting   Aspirin     rash    History of Present Illness    Tammy Hughes has a PMH of  diabetes on insulin, prior gastric adenocarcinoma, hyperlipidemia, essential hypertension, bradycardia, and dizziness.  She was seen and evaluated by Dr. Margaretann Loveless 05/10/2020.  During that time she denied issues with dizziness or lightheadedness.  Her low back pain had improved but was still present.  Her blood pressure was well controlled.  She was compliant with her Plavix for prior history of TIA.  She presents to the clinic today for follow-up evaluation states she feels well.  She reports that she walks more and is more active when the weather is nicer.  She has a good appetite and feels she has been eating well.  However, her husband reports that she enjoys bacon and eggs.  We reviewed high cholesterol foods.  We also reviewed her previous lipid panel.  She follows with her PCP.  I have asked her to maintain a blood pressure log, increase her physical activity as tolerated, will give her the salty 6 diet sheet, and plan follow-up for 1 year.  Today she denies chest pain, shortness of breath, lower extremity edema, fatigue, palpitations, melena, hematuria, hemoptysis, diaphoresis, weakness, presyncope, syncope, orthopnea, and PND.   Home Medications    Prior to Admission medications   Medication Sig Start Date End Date Taking? Authorizing Provider  Acetaminophen (TYLENOL PO) Take 500 mg by mouth 2 (two) times daily.     [provider]  calcium carbonate (TUMS - DOSED IN MG ELEMENTAL CALCIUM) 500 MG chewable tablet Chew 1 tablet by mouth daily.  [provider]  cholecalciferol (VITAMIN D3) 25 MCG (1000 UT) tablet Take 1,000 Units by mouth daily.    [provider]  clopidogrel (PLAVIX) 75 MG tablet TAKE 1 TABLET BY MOUTH ONCE DAILY 06/12/21   Vivi Barrack, MD  donepezil (ARICEPT) 10 MG tablet Take 1 tablet daily 03/21/21   Rondel Jumbo, PA-C  esomeprazole (NEXIUM) 40 MG capsule Take one pill daily for stomach 06/21/20   Orma Flaming, MD  insulin aspart  protamine - aspart (NOVOLOG MIX 70/30 FLEXPEN) (70-30) 100 UNIT/ML FlexPen INJECT 14 UNITS WITH BREAKFAST ,AND 10 UNITS WITH SUPPER, AS DIRECTED 06/06/21   Vivi Barrack, MD  meclizine (ANTIVERT) 25 MG tablet TAKE 1 TABLET BY MOUTH THREE TIMES DAILY IF NEEDED FOR DIZZINESS 01/07/20   Orma Flaming, MD  melatonin 3 MG TABS tablet Take 3 mg by mouth at bedtime.    [provider]  mirtazapine (REMERON) 15 MG tablet TAKE 1 TABLET BY MOUTH EACH NIGHT AT BEDTIME AS DIRECTED 06/21/20   Orma Flaming, MD  [facility] VERIO test strip CHECK BLOOD SUGAR TWICE DAILY 05/10/21   Renato Shin, MD  phenazopyridine (PYRIDIUM) 100 MG tablet Take 100 mg by mouth 3 (three) times daily. 03/09/21   [provider]  rosuvastatin (CRESTOR) 5 MG tablet Take 1 tablet (5 mg total) by mouth daily. 06/21/20   Orma Flaming, MD    Family History    Family History  Problem Relation Age of Onset   Cancer Father        Prostate Cancer   Cancer Sister        Breast Cancer   Diabetes Sister    Diabetes Sister    Other Mother        pneumonia    Cancer Sister        breast cancer    She indicated that her mother is deceased. She indicated that her father is deceased. She indicated that all of her four sisters are deceased.  Social History    Social History   Socioeconomic History   Marital status: Married    Spouse name: Not on file   Number of children: Not on file   Years of education: Not on file   Highest education level: Not on file  Occupational History   Occupation: Retired    Fish farm manager: RETIRED  Tobacco Use   Smoking status: Never   Smokeless tobacco: Never  Vaping Use   Vaping Use: Never used  Substance and Sexual Activity   Alcohol use: No    Alcohol/week: 0.0 standard drinks   Drug use: No   Sexual activity: Not Currently    Birth control/protection: Post-menopausal  Other Topics Concern   Not on file  Social History Narrative   Retired-Domestic Assistant   Right handed     Lives with husband    Social Determinants of Health   Financial Resource Strain: Low Risk    Difficulty of Paying Living Expenses: Not hard at all  Food Insecurity: No Food Insecurity   Worried About Charity fundraiser in the Last Year: Never true   [place] in the Last Year: Never true  Transportation Needs: No Transportation Needs   Lack of Transportation (Medical): No   Lack of Transportation (Non-Medical): No  Physical Activity: Inactive   Days of Exercise per Week: 0 days   Minutes of Exercise per Session: 0 min  Stress: No Stress Concern Present   Feeling of Stress : Not  at all  Social Connections: Moderately Integrated   Frequency of Communication with Friends and Family: More than three times a week   Frequency of Social Gatherings with Friends and Family: More than three times a week   Attends Religious Services: More than 4 times per year   Active Member of [employer] or Organizations: No   Attends Archivist Meetings: Never   Marital Status: Married  Human resources officer Violence: Not At Risk   Fear of Current or Ex-Partner: No   Emotionally Abused: No   Physically Abused: No   Sexually Abused: No     Review of Systems    General:  No chills, fever, night sweats or weight changes.  Cardiovascular:  No chest pain, dyspnea on exertion, edema, orthopnea, palpitations, paroxysmal nocturnal dyspnea. Dermatological: No rash, lesions/masses Respiratory: No cough, dyspnea Urologic: No hematuria, dysuria Abdominal:   No nausea, vomiting, diarrhea, bright red blood per rectum, melena, or hematemesis Neurologic:  No visual changes, wkns, changes in mental status. All other systems reviewed and are otherwise negative except as noted above.  Physical Exam    VS:  BP 134/70 (BP Location: Right Arm)    Pulse (!) 56    Ht 5\' 1"  (1.549 m)    Wt 126 lb 12.8 oz (57.5 kg)    SpO2 96%    BMI 23.96 kg/m  , BMI Body mass index is 23.96 kg/m. GEN: Well nourished, well  developed, in no acute distress. HEENT: normal. Neck: Supple, no JVD, carotid bruits, or masses. Cardiac: RRR, no murmurs, rubs, or gallops. No clubbing, cyanosis, edema.  Radials/DP/PT 2+ and equal bilaterally.  Respiratory:  Respirations regular and unlabored, clear to auscultation bilaterally. GI: Soft, nontender, nondistended, BS + x 4. MS: no deformity or atrophy. Skin: warm and dry, no rash. Neuro:  Strength and sensation are intact. Psych: Normal affect.  Accessory Clinical Findings    Recent Labs: 09/07/2020: ALT 33; BUN 13; Creatinine, Ser 0.93; Hemoglobin 12.0; Platelets 271.0; Potassium 4.3; Sodium 140   Recent Lipid Panel    Component Value Date/Time   CHOL 118 09/20/2019 1115   TRIG 119.0 09/20/2019 1115   HDL 42.00 09/20/2019 1115   CHOLHDL 3 09/20/2019 1115   VLDL 23.8 09/20/2019 1115   LDLCALC 52 09/20/2019 1115   LDLDIRECT 159.0 03/19/2019 1121    ECG personally reviewed by me today-sinus bradycardia with first-degree AV block 56 bpm- No acute changes  Cardiac catheterization 02/02/2016 Ost LAD to Mid LAD lesion, 15% stenosed. The left ventricular systolic function is normal.   1. Minimal nonobstructive CAD 2. Normal LV function   Plan: medical management.  Assessment & Plan   1.  Essential hypertension-BP today 134/70.  Well-controlled at home. Continue blood pressure log Heart healthy low-sodium diet-salty 6 given Increase physical activity as tolerated  Dizziness-reports occasional episodes of dizziness.  Reports occasional use of meclizine Continue meclizine Continue increase p.o. hydration Heart healthy low-sodium diet-salty 6 given Increase physical activity as tolerated Avoid triggers caffeine, chocolate, EtOH, dehydration etc.  History of sinus bradycardia-EKG today shows sinus bradycardia with first-degree AV block 56 bpm.   Avoid AV nodal blocking agents.  Hyperlipidemia-LDL 52 09/20/19 Continue rosuvastatin Heart healthy low-sodium  diet-salty 6 given Increase physical activity as tolerated Follows with PCP  Disposition: Follow-up with Dr.Acharya in 12 months.  Jossie Ng. Deyjah Kindel NP-C    06/28/2021, 3:29 PM [place] [address] Office [phone] Fax [phone]  Notice: This dictation was prepared  with Dragon dictation along with smaller phrase technology. Any transcriptional errors that result from this process are unintentional and may not be corrected upon review.  I spent 13 minutes examining this patient, reviewing medications, and using patient centered shared decision making involving her cardiac care.  Prior to her visit I spent greater than 20 minutes reviewing her past medical history,  medications, and prior cardiac tests.

## 2021-06-27 NOTE — Telephone Encounter (Signed)
Copied from [place] [phone]. Topic: Medicare AWV >> Jun 27, 2021 10:31 AM Harris-Coley, Hannah Beat wrote: Reason for CRM: Left message for patient to schedule Annual Wellness Visit.  Please schedule with Nurse Health Advisor Charlott Rakes, RN at [facility].  Please call [phone] ask for [facility]

## 2021-06-28 ENCOUNTER — Encounter: Payer: Self-pay | Admitting: General Practice

## 2021-06-28 ENCOUNTER — Ambulatory Visit (INDEPENDENT_AMBULATORY_CARE_PROVIDER_SITE_OTHER): Payer: Medicare (Managed Care) | Admitting: General Practice

## 2021-06-28 ENCOUNTER — Other Ambulatory Visit: Payer: Self-pay

## 2021-06-28 VITALS — BP 134/70 | HR 56 | Ht 61.0 in | Wt 126.8 lb

## 2021-06-28 DIAGNOSIS — R001 Bradycardia, unspecified: Secondary | ICD-10-CM

## 2021-06-28 DIAGNOSIS — E782 Mixed hyperlipidemia: Secondary | ICD-10-CM | POA: Diagnosis not present

## 2021-06-28 DIAGNOSIS — R42 Dizziness and giddiness: Secondary | ICD-10-CM | POA: Diagnosis not present

## 2021-06-28 DIAGNOSIS — I10 Essential (primary) hypertension: Secondary | ICD-10-CM | POA: Diagnosis not present

## 2021-06-28 NOTE — Patient Instructions (Signed)
Medication Instructions:  The current medical regimen is effective;  continue present plan and medications as directed. Please refer to the Current Medication list given to you today.   *If you need a refill on your cardiac medications before your next appointment, please call your pharmacy*  Lab Work:   Testing/Procedures:  NONE    NONE  Special Instructions PLEASE READ AND FOLLOW SALTY 6-ATTACHED-1,800mg  daily  PLEASE INCREASE PHYSICAL ACTIVITY AS TOLERATED   TAKE AND LOG YOUR BLOOD PRESSURE-2-3 TIMES A WEEK. CALL IF >140  Follow-Up: Your next appointment:  1 year(s) In Person with Elouise Munroe, MD  or Coletta Memos, FNP      Please call our office 2 months in advance to schedule this appointment  At [facility], you and your health needs are our priority.  As part of our continuing mission to provide you with exceptional heart care, we have created designated Provider Care Teams.  These Care Teams include your primary Cardiologist (physician) and Advanced Practice Providers (APPs -  Physician Assistants and Nurse Practitioners) who all work together to provide you with the care you need, when you need it.            6 SALTY THINGS TO AVOID     1,800MG  DAILY

## 2021-07-02 ENCOUNTER — Other Ambulatory Visit: Payer: Self-pay | Admitting: Family Medicine

## 2021-07-16 ENCOUNTER — Other Ambulatory Visit: Payer: Self-pay | Admitting: Family Medicine

## 2021-07-26 ENCOUNTER — Ambulatory Visit (INDEPENDENT_AMBULATORY_CARE_PROVIDER_SITE_OTHER): Payer: Medicare (Managed Care) | Admitting: Nurse Practitioner

## 2021-07-26 ENCOUNTER — Encounter: Payer: Self-pay | Admitting: Nurse Practitioner

## 2021-07-26 ENCOUNTER — Other Ambulatory Visit: Payer: Self-pay

## 2021-07-26 VITALS — BP 128/60 | HR 58 | Temp 97.8°F | Ht 60.0 in | Wt 125.8 lb

## 2021-07-26 DIAGNOSIS — J84112 Idiopathic pulmonary fibrosis: Secondary | ICD-10-CM

## 2021-07-26 DIAGNOSIS — R079 Chest pain, unspecified: Secondary | ICD-10-CM

## 2021-07-26 DIAGNOSIS — K219 Gastro-esophageal reflux disease without esophagitis: Secondary | ICD-10-CM

## 2021-07-26 MED ORDER — PANTOPRAZOLE SODIUM 40 MG PO TBEC: 40.0000 mg | DELAYED_RELEASE_TABLET | Freq: Two times a day (BID) | ORAL | 2 refills | Status: DC

## 2021-07-26 NOTE — Assessment & Plan Note (Signed)
Description of pain suspicious for possible uncontrolled GERD - does have a hx of esophageal stricture. Stopped nexium. Started on protonix Twice daily with PRN tums.

## 2021-07-26 NOTE — Patient Instructions (Addendum)
-  Finish keflex as previously prescribed -Stop nexium -Start protonix 40 mg Twice daily  -Continue Tums as needed for reflux   Follow up with cardiology - call to schedule appointment.   Walking oximetry today with oxygen saturation of 96% on room air, which is good.  Notify if you develop new shortness of breath or worsening cough.   Follow up in 2-3 months with Dr. Elsworth Soho. If symptoms do not improve or worsen, please contact office for sooner follow up or seek emergency care.

## 2021-07-26 NOTE — Progress Notes (Signed)
@Patient  ID: Tammy Hughes, female    DOB: 25-May-1933, 87 y.o.   MRN: [id number]  Chief Complaint  Patient presents with   Follow-up    Overdue follow up. Went to the ER on Sunday because she though she was having an MI but the ER told her it was her lungs and to follow up with [place]     Referring provider: Vivi Barrack, MD  HPI: 88 year old female, never smoker followed for idiopathic pulmonary fibrosis. Previously treated with Esbriet but did not tolerate it. She is a patient of Dr. Bari Mantis and last seen in office on 12/19/2020. Past medical history significant for HTN, CAD, hx of DVT, TIA, non-allergic rhinitis, GERD, NASH, gastric cancer Stage IIA s/p gastrectomy 2016, HLD, DM II, mild dementia, osteoarthritis, back pain, insomnia.   TEST/EVENTS:  09/2013 PFTs: DLCO 72%, TLC 93%, FEV1 80% 10/2014 CTA chest: ILD, favored fibrotic NSIP, stable from 2015 11/2020 HRCT: widespread but patchy areas of ground-glass attenuation, septal thickening, subpleural reticulation, parenchymal banding, traction bronchiectasis and honeycombing. Findings have a mild craniocaudal gradient and are clearly progressive compared to prior study from 2016 12/2020 PFTs: FVC 88%, DLCO 12.57 (75%)  12/19/2020: OV with Dr. Elsworth Soho. Based on HRCT with peripheral and bibasilar distribution with craniocaudal gradient and honeycombing, reclassified as IPF. Imaging worse but preserved lung function with DLCO at 75% and maintained FVC. Shared decision to not tx with antifibrotics d/t not having tolerated Esbriet in past. Main symptom burden cough; not requiring maintenance medication. F/u 4 months.   07/26/2020: Today - acute visit Patient presents today with husband after being seen in the ED Sunday for chest pain. She was told that her pain was coming from her lungs and that she was not having a MI. She was found to have a UTI and was put on Keflex. Her CXR showed pulmonary fibrosis but no acute process. There was no evidence of ACS  on her EKG, which showed sinus bradycardia with first degree block, consistent with previous. Her troponins were slightly elevated; however, this appeared to be consistent with past troponins and never trended upward. She reports that her pain comes and goes and is sharp, sometimes burning towards the middle/left of her chest. She does notice that sometimes the pain occurs after she eats and she occasionally experiences a sensation of fullness. She does not notice a correlation to exertion with the pain. She did have some dizziness that she saw cardiology for in December but denies recent episodes or palpitations. Her breathing is stable and she denies experiencing any shortness of breath. Her cough is unchanged and with occasional clear sputum production, but it does not bother her and is not frequent. She denies wheezing, orthopnea, PND or hemoptysis. She is currently on Nexium for GERD. She is not on any antifibrotic therapies.   Allergies  Allergen Reactions   Pirfenidone Diarrhea and Nausea And Vomiting   Aspirin     rash    Immunization History  Administered Date(s) Administered   Fluad Quad(high Dose 65+) 03/19/2019, 04/18/2020, 06/04/2021   Influenza Split 07/15/2009, 07/15/2010, 05/16/2011, 04/16/2012, 05/15/2013   Influenza Whole 04/11/2008, 03/15/2010   Influenza, High Dose Seasonal PF 05/27/2017, 04/24/2018   Influenza,inj,Quad PF,6+ Mos 04/06/2014, 03/28/2015, 03/15/2016   Influenza-Unspecified 04/13/2013   Moderna Sars-Covid-2 Vaccination 09/09/2019, 10/07/2019, 05/09/2020   Pneumococcal Conjugate-13 11/12/2013   Pneumococcal Polysaccharide-23 07/15/1993, 07/15/2004, 07/23/2011   Td 01/12/1998, 07/17/2007   Tdap 11/21/2017   Zoster, Live 06/25/2016    Past Medical  History:  Diagnosis Date   Allergy    SEASONAL   ANXIETY 03/08/2008   Cataract    REMOVED BILATERAL   Depression    DIVERTICULOSIS, COLON 03/08/2008   DVT (deep venous thrombosis) ([place])    GERD 02/23/2007    HYPERCHOLESTEROLEMIA 02/01/2008   HYPERTENSION 03/08/2008   Insulin dependent diabetes mellitus    OSTEOARTHRITIS 02/23/2007   OSTEOPOROSIS 02/23/2007   PERIPHERAL NEUROPATHY 02/23/2007   STD (sexually transmitted disease)    Stomach cancer ([place]) 07/20/14   invasive adenocarcinoma w/signet rings features   Urinary incontinence    Urolithiasis     Tobacco History: Social History   Tobacco Use  Smoking Status Never  Smokeless Tobacco Never   Counseling given: Not Answered   Outpatient Medications Prior to Visit  Medication Sig Dispense Refill   Acetaminophen (TYLENOL PO) Take 500 mg by mouth 2 (two) times daily.      calcium carbonate (TUMS - DOSED IN MG ELEMENTAL CALCIUM) 500 MG chewable tablet Chew 1 tablet by mouth daily. As needed     cephALEXin (KEFLEX) 500 MG capsule Take 500 mg by mouth 3 (three) times daily.     cholecalciferol (VITAMIN D3) 25 MCG (1000 UT) tablet Take 1,000 Units by mouth daily.     clopidogrel (PLAVIX) 75 MG tablet TAKE 1 TABLET BY MOUTH ONCE DAILY 90 tablet 0   donepezil (ARICEPT) 10 MG tablet Take 1 tablet daily 90 tablet 3   insulin aspart protamine - aspart (NOVOLOG MIX 70/30 FLEXPEN) (70-30) 100 UNIT/ML FlexPen INJECT 14 UNITS WITH BREAKFAST ,AND 10 UNITS WITH SUPPER, AS DIRECTED 15 mL 0   meclizine (ANTIVERT) 25 MG tablet TAKE 1 TABLET BY MOUTH THREE TIMES DAILY IF NEEDED FOR DIZZINESS 30 tablet 0   melatonin 3 MG TABS tablet Take 3 mg by mouth at bedtime.     mirtazapine (REMERON) 15 MG tablet TAKE 1 TABLET BY MOUTH EACH NIGHT AT BEDTIME AS DIRECTED 90 tablet 0   ONETOUCH VERIO test strip CHECK BLOOD SUGAR TWICE DAILY 200 strip 0   phenazopyridine (PYRIDIUM) 100 MG tablet Take 100 mg by mouth 3 (three) times daily.     rosuvastatin (CRESTOR) 5 MG tablet Take 1 tablet (5 mg total) by mouth daily. 90 tablet 0   esomeprazole (NEXIUM) 40 MG capsule TAKE 1 CAPSULE BY MOUTH ONCE DAILY FOR STOMACH (DO NOT CRUSH) 90 capsule 0   No facility-administered  medications prior to visit.     Review of Systems:   Constitutional: No weight loss or gain, night sweats, fevers, chills, fatigue, or lassitude. HEENT: No headaches, difficulty swallowing, tooth/dental problems, or sore throat. No sneezing, itching, ear ache, nasal congestion, or post nasal drip CV:  +chest pain (intermittent, sharp/burning). No orthopnea, PND, swelling in lower extremities, anasarca, dizziness, palpitations, syncope Resp: +cough (unchanged from baseline; occasional). No shortness of breath with exertion or at rest. No excess mucus or change in color of mucus. No hemoptysis. No wheezing.  No chest wall deformity GI:  +occasional indigestion; intermittent feelings of fullness. No abdominal pain, nausea, vomiting, diarrhea, change in bowel habits, loss of appetite, bloody stools.  GU: No dysuria, change in color of urine, urgency or frequency.  No flank pain, no hematuria  Skin: No rash, lesions, ulcerations MSK:  No joint pain or swelling.  No decreased range of motion.  No back pain. Neuro: No dizziness or lightheadedness.  Psych: No depression or anxiety. Mood stable.     Physical Exam:  BP 128/60 (BP Location:  Right Arm, Patient Position: Sitting, Cuff Size: Normal)    Pulse (!) 58    Temp 97.8 F (36.6 C) (Oral)    Ht 5' (1.524 m)    Wt 125 lb 12.8 oz (57.1 kg)    SpO2 97%    BMI 24.57 kg/m   GEN: Pleasant, interactive, well-nourished; in no acute distress. HEENT:  Normocephalic and atraumatic. EACs patent bilaterally. TM pearly gray with present light reflex bilaterally. PERRLA. Sclera white. Nasal turbinates pink, moist and patent bilaterally. No rhinorrhea present. Oropharynx pink and moist, without exudate or edema. No lesions, ulcerations, or postnasal drip.  NECK:  Supple w/ fair ROM. No JVD present. Normal carotid impulses w/o bruits. Thyroid symmetrical with no goiter or nodules palpated. No lymphadenopathy.   CV: RRR, no m/r/g, no peripheral edema. Pulses  intact, +2 bilaterally. No cyanosis, pallor or clubbing. PULMONARY:  Unlabored, regular breathing. Dry rales bilateral lower bases; otherwise clear bilaterally A&P w/o wheezes/rhonchi. No accessory muscle use. No dullness to percussion. GI: BS present and normoactive. Soft, non-tender to palpation. No organomegaly or masses detected. No CVA tenderness. MSK: No erythema, warmth or tenderness. Cap refil <2 sec all extrem. No deformities or joint swelling noted.  Neuro: A/Ox3. No focal deficits noted.   Skin: Warm, no lesions or rashe Psych: Normal affect and behavior. Judgement and thought content appropriate.     Lab Results:  CBC    Component Value Date/Time   WBC 5.3 09/07/2020 1207   RBC 4.00 09/07/2020 1207   HGB 12.0 09/07/2020 1207   HGB 11.0 (L) 11/07/2014 1001   HCT 36.2 09/07/2020 1207   HCT 34.3 (L) 11/07/2014 1001   PLT 271.0 09/07/2020 1207   PLT 242 11/07/2014 1001   MCV 90.5 09/07/2020 1207   MCV 89.6 11/07/2014 1001   MCH 29.8 06/21/2020 1402   MCHC 33.2 09/07/2020 1207   RDW 13.3 09/07/2020 1207   RDW 21.5 (H) 11/07/2014 1001   LYMPHSABS 2.0 09/07/2020 1207   LYMPHSABS 0.4 (L) 11/07/2014 1001   MONOABS 0.5 09/07/2020 1207   MONOABS 0.8 11/07/2014 1001   EOSABS 0.2 09/07/2020 1207   EOSABS 0.2 11/07/2014 1001   BASOSABS 0.0 09/07/2020 1207   BASOSABS 0.0 11/07/2014 1001    BMET    Component Value Date/Time   NA 140 09/07/2020 1207   NA 139 11/07/2014 1001   K 4.3 09/07/2020 1207   K 4.6 11/07/2014 1001   CL 104 09/07/2020 1207   CO2 31 09/07/2020 1207   CO2 26 11/07/2014 1001   GLUCOSE 81 09/07/2020 1207   GLUCOSE 189 (H) 11/07/2014 1001   BUN 13 09/07/2020 1207   BUN 15.8 11/07/2014 1001   CREATININE 0.93 09/07/2020 1207   CREATININE 0.90 (H) 06/21/2020 1402   CREATININE 0.8 11/07/2014 1001   CALCIUM 10.5 09/07/2020 1207   CALCIUM 9.8 11/07/2014 1001   GFRNONAA 58 (L) 03/22/2020 1359   GFRAA 67 03/22/2020 1359    BNP No results found for:  BNP   Imaging:  No results found.    PFT Results Latest Ref Rng & Units 12/19/2020 09/30/2013  FVC-Pre L 1.33 1.60  FVC-Predicted Pre % 88 91  FVC-Post L 1.40 1.42  FVC-Predicted Post % 92 80  Pre FEV1/FVC % % 76 85  Post FEV1/FCV % % 76 82  FEV1-Pre L 1.02 1.36  FEV1-Predicted Pre % 87 101  FEV1-Post L 1.07 1.16  DLCO uncorrected ml/min/mmHg 12.57 14.73  DLCO UNC% % 75 72  DLCO corrected ml/min/mmHg  12.57 -  DLCO COR %Predicted % 75 -  DLVA Predicted % 95 133  TLC L - 4.31  TLC % Predicted % - 93  RV % Predicted % - 124    No results found for: NITRICOXIDE   07/26/2021: Walking oximetry today with SpO2 low 96% on room air - 500 ft completed without difficulties    Assessment & Plan:   IPF (idiopathic pulmonary fibrosis) (HCC) Worsening on HRCT from May 2022. Reserved lung function on PFTs thus far. No SOB or worsening cough per patient and husband. Previously unable to tolerate Esbriet and shared decision not to start other antifibrotic. Oxygen stable - walking oximetry 96% on room air. Do not believe chest pain is related to IPF given stability of pulm status. Follow up with Dr. Elsworth Soho  Patient Instructions  -Windle Guard as previously prescribed -Stop nexium -Start protonix 40 mg Twice daily  -Continue Tums as needed for reflux   Follow up with cardiology - call to schedule appointment.   Walking oximetry today with oxygen saturation of 96% on room air, which is good.  Notify if you develop new shortness of breath or worsening cough.   Follow up in 2-3 months with Dr. Elsworth Soho. If symptoms do not improve or worsen, please contact office for sooner follow up or seek emergency care.   GERD Description of pain suspicious for possible uncontrolled GERD - does have a hx of esophageal stricture. Stopped nexium. Started on protonix Twice daily with PRN tums.   Chest pain EKG and labs unremarkable per ED report. No evidence of ACS. Not currently experiencing pain at  office; stable; in no acute distress. Do not suspect that it is related to IPF given stability of pulm symptoms and without dyspnea/worsening cough. Cough is only occasional and no association with pain. Unable to elicit upon palpation. Did note that pain occurs sometimes after eating and also experiences a fullness sensation at times. Possibly r/t uncontrolled GERD. Transitioned to Twice daily PPI. Directed to follow up with cardiology for further workup/evaluation. ED precautions discussed.    Clayton Bibles, NP 07/26/2021  Pt aware and understands NP's role.

## 2021-07-26 NOTE — Assessment & Plan Note (Addendum)
Worsening on HRCT from May 2022. Reserved lung function on PFTs thus far. No SOB or worsening cough per patient and husband. Previously unable to tolerate Esbriet and shared decision not to start other antifibrotic. Oxygen stable - walking oximetry 96% on room air. Do not believe chest pain is related to IPF given stability of pulm status. Follow up with Dr. Elsworth Soho  Patient Instructions  -Windle Guard as previously prescribed -Stop nexium -Start protonix 40 mg Twice daily  -Continue Tums as needed for reflux   Follow up with cardiology - call to schedule appointment.   Walking oximetry today with oxygen saturation of 96% on room air, which is good.  Notify if you develop new shortness of breath or worsening cough.   Follow up in 2-3 months with Dr. Elsworth Soho. If symptoms do not improve or worsen, please contact office for sooner follow up or seek emergency care.

## 2021-07-26 NOTE — Assessment & Plan Note (Addendum)
EKG and labs unremarkable per ED report. No evidence of ACS. Not currently experiencing pain at office; stable; in no acute distress. Do not suspect that it is related to IPF given stability of pulm symptoms and without dyspnea/worsening cough. Cough is only occasional and no association with pain. Unable to elicit upon palpation. Did note that pain occurs sometimes after eating and also experiences a fullness sensation at times. Possibly r/t uncontrolled GERD. Transitioned to Twice daily PPI. Directed to follow up with cardiology for further workup/evaluation. ED precautions discussed.

## 2021-08-07 ENCOUNTER — Other Ambulatory Visit: Payer: Self-pay | Admitting: Endocrinology

## 2021-08-07 DIAGNOSIS — Z794 Long term (current) use of insulin: Secondary | ICD-10-CM

## 2021-08-09 ENCOUNTER — Other Ambulatory Visit: Payer: Self-pay

## 2021-08-09 ENCOUNTER — Encounter: Payer: Self-pay | Admitting: Family Medicine

## 2021-08-09 ENCOUNTER — Ambulatory Visit (INDEPENDENT_AMBULATORY_CARE_PROVIDER_SITE_OTHER): Payer: Medicare (Managed Care) | Admitting: Family Medicine

## 2021-08-09 VITALS — BP 149/70 | HR 56 | Temp 97.6°F | Ht 60.0 in | Wt 126.4 lb

## 2021-08-09 DIAGNOSIS — E1142 Type 2 diabetes mellitus with diabetic polyneuropathy: Secondary | ICD-10-CM | POA: Diagnosis not present

## 2021-08-09 DIAGNOSIS — M1990 Unspecified osteoarthritis, unspecified site: Secondary | ICD-10-CM | POA: Diagnosis not present

## 2021-08-09 DIAGNOSIS — E1159 Type 2 diabetes mellitus with other circulatory complications: Secondary | ICD-10-CM

## 2021-08-09 DIAGNOSIS — E785 Hyperlipidemia, unspecified: Secondary | ICD-10-CM | POA: Insufficient documentation

## 2021-08-09 DIAGNOSIS — I152 Hypertension secondary to endocrine disorders: Secondary | ICD-10-CM

## 2021-08-09 NOTE — Progress Notes (Signed)
° °  Tammy Hughes is a 88 y.o. female who presents today for an office visit.  Assessment/Plan:  Chronic Problems Addressed Today: Osteoarthritis No red flags.  Has a little worsening back pain.  Recommended heating pad.  She can use Tylenol 1000 mg 3 times daily as needed.  She will let me know if symptoms persist.  Could consider adding on NSAID as needed.  Hypertension associated with diabetes ([place]) At goal per JNC 8 without medications.  Type 2 diabetes mellitus with peripheral neuropathy ([place]) Follows with endocrinology.  Last A1c 7.9.  She takes insulin 70/30 and tolerates well.  Dyslipidemia On crestor 5mg  daily. Tolerating well. Check lipids next blood draw.      Subjective:  HPI:  She is here to follow up on diabetes. Last saw her in the office 6 months ago. She has been doing well.  She has been checking her blood sugar at home. She notes her blood sugar at home has been relatively well. No shortness of breath or fever.  Since our last visit, she went to ED with the chest pain and weakness. Her blood sugar was low at that time. Had work-up which showed UTI. She was started on Keflex 500 mg 3 times daily. She had no symptoms since then. No chest pain or shortness of breath.   She complain of back pain . Located on right side of the back. She has tried taking Tynelol with some relief. She takes this every night. She does have a hx of arthritis. She denies pain in any other areas  See A/p for status of chronic conditions.        Objective:  Physical Exam: BP (!) 149/70    Pulse (!) 56    Temp 97.6 F (36.4 C) (Temporal)    Ht 5' (1.524 m)    Wt 126 lb 6.4 oz (57.3 kg)    SpO2 95%    BMI 24.69 kg/m   Gen: No acute distress, resting comfortably CV: Regular rate and rhythm with no murmurs appreciated Pulm: Normal work of breathing, clear to auscultation bilaterally with no crackles, wheezes, or rhonchi Neuro: Grossly normal, moves all extremities Psych: Normal affect and  thought content       I,Savera Zaman,acting as a scribe for Dimas Chyle, MD.,have documented all relevant documentation on the behalf of Dimas Chyle, MD,as directed by  Dimas Chyle, MD while in the presence of Dimas Chyle, MD.   I, Dimas Chyle, MD, have reviewed all documentation for this visit. The documentation on 08/09/21 for the exam, diagnosis, procedures, and orders are all accurate and complete.  Algis Greenhouse. Jerline Pain, MD 08/09/2021 2:26 PM

## 2021-08-09 NOTE — Assessment & Plan Note (Signed)
On crestor 5mg  daily. Tolerating well. Check lipids next blood draw.

## 2021-08-09 NOTE — Patient Instructions (Signed)
It was very nice to see you today!  You can take Tylenol twice daily.  No changes today.  I will see back in 6 months.  Come back to see me sooner if needed.  Take care, Dr Jerline Pain  PLEASE NOTE:  If you had any lab tests please let us know if you have not heard back within a few days. You may see your results on mychart before we have a chance to review them but we will give you a call once they are reviewed by [place]. If we ordered any referrals today, please let us know if you have not heard from their office within the next week.   Please try these tips to maintain a healthy lifestyle:  Eat at least 3 REAL meals and 1-2 snacks per day.  Aim for no more than 5 hours between eating.  If you eat breakfast, please do so within one hour of getting up.   Each meal should contain half fruits/vegetables, one quarter protein, and one quarter carbs (no bigger than a computer mouse)  Cut down on sweet beverages. This includes juice, soda, and sweet tea.   Drink at least 1 glass of water with each meal and aim for at least 8 glasses per day  Exercise at least 150 minutes every week.

## 2021-08-09 NOTE — Assessment & Plan Note (Signed)
No red flags.  Has a little worsening back pain.  Recommended heating pad.  She can use Tylenol 1000 mg 3 times daily as needed.  She will let me know if symptoms persist.  Could consider adding on NSAID as needed.

## 2021-08-09 NOTE — Assessment & Plan Note (Signed)
Follows with endocrinology.  Last A1c 7.9.  She takes insulin 70/30 and tolerates well.

## 2021-08-09 NOTE — Assessment & Plan Note (Signed)
At goal per JNC 8 without medications.

## 2021-08-10 ENCOUNTER — Other Ambulatory Visit: Payer: Self-pay | Admitting: *Deleted

## 2021-08-10 ENCOUNTER — Other Ambulatory Visit: Payer: Self-pay | Admitting: Family Medicine

## 2021-08-10 ENCOUNTER — Other Ambulatory Visit: Payer: Self-pay | Admitting: Endocrinology

## 2021-08-10 DIAGNOSIS — E119 Type 2 diabetes mellitus without complications: Secondary | ICD-10-CM

## 2021-08-10 DIAGNOSIS — Z794 Long term (current) use of insulin: Secondary | ICD-10-CM

## 2021-08-10 NOTE — Telephone Encounter (Signed)
Error

## 2021-09-19 ENCOUNTER — Encounter: Payer: Self-pay | Admitting: Physician Assistant

## 2021-09-19 ENCOUNTER — Other Ambulatory Visit: Payer: Self-pay

## 2021-09-19 ENCOUNTER — Ambulatory Visit (INDEPENDENT_AMBULATORY_CARE_PROVIDER_SITE_OTHER): Payer: Medicare (Managed Care) | Admitting: Physician Assistant

## 2021-09-19 VITALS — BP 146/74 | HR 59 | Ht 60.0 in | Wt 127.0 lb

## 2021-09-19 DIAGNOSIS — G301 Alzheimer's disease with late onset: Secondary | ICD-10-CM | POA: Diagnosis not present

## 2021-09-19 DIAGNOSIS — F02A0 Dementia in other diseases classified elsewhere, mild, without behavioral disturbance, psychotic disturbance, mood disturbance, and anxiety: Secondary | ICD-10-CM | POA: Diagnosis not present

## 2021-09-19 NOTE — Patient Instructions (Signed)
Good to see you!  ?Continue Donepezil '10mg'$  daily.  ?Continue close supervision, monitor driving.  ?Follow-up in 6 months, call for any changes. ? ?FALL PRECAUTIONS: Be cautious when walking. Scan the area for obstacles that may increase the risk of trips and falls. When getting up in the mornings, sit up at the edge of the bed for a few minutes before getting out of bed. Consider elevating the bed at the head end to avoid drop of blood pressure when getting up. Walk always in a well-lit room (use night lights in the walls). Avoid area rugs or power cords from appliances in the middle of the walkways. Use a walker or a cane if necessary and consider physical therapy for balance exercise. Get your eyesight checked regularly. ? ?FINANCIAL OVERSIGHT: Supervision, especially oversight when making financial decisions or transactions is also recommended. ? ?HOME SAFETY: Consider the safety of the kitchen when operating appliances like stoves, microwave oven, and blender. Consider having supervision and share cooking responsibilities until no longer able to participate in those. Accidents with firearms and other hazards in the house should be identified and addressed as well. ? ?DRIVING: Regarding driving, in patients with progressive memory problems, driving will be impaired. We advise to have someone else do the driving if trouble finding directions or if minor accidents are reported. Independent driving assessment is available to determine safety of driving. ? ?ABILITY TO BE LEFT ALONE: If patient is unable to contact 911 operator, consider using LifeLine, or when the need is there, arrange for someone to stay with patients. Smoking is a fire hazard, consider supervision or cessation. Risk of wandering should be assessed by caregiver and if detected at any point, supervision and safe proof recommendations should be instituted. ? ?MEDICATION SUPERVISION: Inability to self-administer medication needs to be constantly  addressed. Implement a mechanism to ensure safe administration of the medications. ? ?RECOMMENDATIONS FOR ALL PATIENTS WITH MEMORY PROBLEMS: ?1. Continue to exercise (Recommend 30 minutes of walking everyday, or 3 hours every week) ?2. Increase social interactions - continue going to [place] and enjoy social gatherings with friends and family ?3. Eat healthy, avoid fried foods and eat more fruits and vegetables ?4. Maintain adequate blood pressure, blood sugar, and blood cholesterol level. Reducing the risk of stroke and cardiovascular disease also helps promoting better memory. ?5. Avoid stressful situations. Live a simple life and avoid aggravations. Organize your time and prepare for the next day in anticipation. ?6. Sleep well, avoid any interruptions of sleep and avoid any distractions in the bedroom that may interfere with adequate sleep quality ?7. Avoid sugar, avoid sweets as there is a strong link between excessive sugar intake, diabetes, and cognitive impairment ?We discussed the Mediterranean diet, which has been shown to help patients reduce the risk of progressive memory disorders and reduces cardiovascular risk. This includes eating fish, eat fruits and green leafy vegetables, nuts like almonds and hazelnuts, walnuts, and also use olive oil. Avoid fast foods and fried foods as much as possible. Avoid sweets and sugar as sugar use has been linked to worsening of memory function. ? ?There is always a concern of gradual progression of memory problems. If this is the case, then we may need to adjust level of care according to patient needs. Support, both to the patient and caregiver, should then be put into place. ? ?

## 2021-09-19 NOTE — Progress Notes (Signed)
Assessment/Plan:    Mild Dementia, likely due to Alzheimer's Disease  MMSE today is 27/30 with delayed recall 2/3. She is on Donepezil 65m daily tolerating without side effects.  Stable from the cognitive standpoint.   Recommendations:  Discussed safety both in and out of the home.  Discussed the importance of regular daily schedule with inclusion of crossword puzzles to maintain brain function.  Continue to monitor mood by PCP Stay active at least 30 minutes at least 3 times a week.  Naps should be scheduled and should be no longer than 60 minutes and should not occur after 2 PM.  Mediterranean diet is recommended  Continue donepezil 10 mg daily Side effects were discussed Continue to monitor driving Follow up in 6  months.   Case discussed with Dr. ADelice Leschwho agrees with the plan     Subjective:   ED visits since last seen: none  Hospital admissions: none  Tammy SPIKEis a 88y.o. female with a history of  hypertension, hyperlipidemia, diabetes, gastric cancer sp chemo/XRT, anxiety, depression and IPF seen today in a follow-up evaluation for mild dementia, likely due to Alzheimer's disease. This patient is accompanied in the office by her husband who supplements the history.  Previous records as well as any outside records available were reviewed prior to todays visit.  She was last seen at our clinic on 07/13/2020.   She is currently on donepezil 10 mg daily, tolerating well. She reports feeling well, stating that her memory is "about the same ".  She continues to do crossword puzzles, word finding, and Scrabble.  She continues to enjoy gardening and walking short distances. She enjoys cooking denies leaving the stove on.  [name] Hughes her pillbox for her to take the medications by herself, and denies forgetting doses.  She is independent with dressing and bathing.  She sleeps well as long as she takes mirtazapine 50 mg nightly and melatonin.  She denies any  personality changes, sleepwalking, paranoia or hallucinations.  She denies any falls, and walks without the assistance of a cane or walker.  She continues to drive very short distances, only once or twice a week and denies getting lost.  Her husband took over the finances for years ago.  Her appetite is good and denies trouble swallowing. She has infrequent headaches, and having had an episode today, short lived, treated with Tylenol.  She denies any dizziness or vertigo, focal numbness or tingling, unilateral weakness or tremors.  She has very mild incontinence of urine, and uses a pad.  She denies any constipation or diarrhea.     History on Initial Assessment 12/28/2019: This is an 88year old right-handed woman with a history of hypertension, hyperlipidemia, diabetes, stomach cancer, anxiety, depression, presenting for evaluation of memory loss. [name] Hughes present to provide additional information. She states "I just can't remember." Tammy Hughes started noticing changes around 4 years ago after she underwent chemotherapy and radiation treatment, worse in the past year. She repeats herself several times. She lives with her husband. She was diagnosed with cancer 4 years ago and was forgetting bill payments, her husband took over at that time. She denies getting lost driving. She forgets her medications, they had to count her pills and find that she has not taken them as instructed. She denies leaving the stove on but Tammy Hughes reports she has burned food, thinking she turned the burner off. She denies misplacing things. She is independent with dressing and  bathing. Her 2 sisters and brother had Alzheimer's disease. No concussions or alcohol use. MMSE 24/30 in PCP office last 09/2019.   She has infrequent headaches, occasional dizziness/vertigo, low back pain, urinary incontinence. She denies any diplopia, dysarthria/dysphagia, focal numbness/tingling/weakness, tremors, or anosmia. No falls. Sleep is not good  sometimes, she occasionally takes naps. No wandering behavior. Her daughter notes she is a little more irritable than before, she does not like a lot of people around. No hallucinations or paranoia.     Laboratory Data:      Lab Results  Component Value Date    TSH 0.69 06/21/2020         Lab Results  Component Value Date    VITAMINB12 1,317 (H) 07/05/2019     PREVIOUS MEDICATIONS:   CURRENT MEDICATIONS:  Outpatient Encounter Medications as of 09/19/2021  Medication Sig   Acetaminophen (TYLENOL PO) Take 500 mg by mouth 2 (two) times daily.    calcium carbonate (TUMS - DOSED IN MG ELEMENTAL CALCIUM) 500 MG chewable tablet Chew 1 tablet by mouth daily. As needed   clopidogrel (PLAVIX) 75 MG tablet TAKE 1 TABLET BY MOUTH ONCE DAILY   donepezil (ARICEPT) 10 MG tablet Take 1 tablet daily   meclizine (ANTIVERT) 25 MG tablet TAKE 1 TABLET BY MOUTH THREE TIMES DAILY IF NEEDED FOR DIZZINESS   melatonin 3 MG TABS tablet Take 3 mg by mouth at bedtime.   mirtazapine (REMERON) 15 MG tablet TAKE 1 TABLET BY MOUTH EACH NIGHT AT BEDTIME AS DIRECTED   NOVOLOG MIX 70/30 FLEXPEN (70-30) 100 UNIT/ML FlexPen INJECT 14 UNITS WITH BREAKFAST ,AND 10 UNITS WITH SUPPER, AS DIRECTED   ONETOUCH VERIO test strip CHECK BLOOD SUGAR TWICE DAILY   pantoprazole (PROTONIX) 40 MG tablet Take 1 tablet (40 mg total) by mouth 2 (two) times daily.   phenazopyridine (PYRIDIUM) 100 MG tablet Take 100 mg by mouth 3 (three) times daily.   rosuvastatin (CRESTOR) 5 MG tablet Take 1 tablet (5 mg total) by mouth daily.   [DISCONTINUED] cholecalciferol (VITAMIN D3) 25 MCG (1000 UT) tablet Take 1,000 Units by mouth daily. (Patient not taking: Reported on 09/19/2021)   No facility-administered encounter medications on file as of 09/19/2021.     Objective:     PHYSICAL EXAMINATION:    VITALS:   Vitals:   09/19/21 1304  BP: (!) 146/74  Pulse: (!) 59  SpO2: 95%  Weight: 127 lb (57.6 kg)  Height: 5' (1.524 m)     GEN:  The  patient appears stated age and is in NAD. HEENT:  Normocephalic, atraumatic.   Neurological examination:  General: NAD, well-groomed, appears stated age. Orientation: The patient is alert. Oriented to person, place and the date is 04/10/2021. Cranial nerves: There is good facial symmetry.The speech is fluent and clear but soft. No aphasia or dysarthria. Fund of knowledge is appropriate. Recent memory and remote memory are impaired. Attention and concentration are reduced, unable to correctly spell WORLD backwards (only W) Able to name objects and repeat phrases.  Hearing is intact to conversational tone.    Sensation: Sensation is intact to light touch throughout Motor: Strength is at least antigravity x4. Tremors: none  DTR's 2/4 in UE/LE    No flowsheet data found. MMSE - Mini Mental State Exam 09/20/2021 03/21/2021 07/13/2020  Orientation to time _0 Orientation to Place _1 Registration _2 Attention/ Calculation 4 1 0  Recall _3 Language- name 2  objects _0 Language- repeat 1 1 0  Language- follow 3 step command _1 Language- read & follow direction _2 Write a sentence _3 Copy design 0 0 0  Total score _4 [employer] Mental Exam 12/28/2019  Weekday Correct 1  Current year 1  What state are we in? 1  Amount spent 1  Amount left 0  # of Animals 1  5 objects recall 1  Number series 0  Hour markers 0  Time correct 0  Placed X in triangle correctly 1  Largest Figure 1  Name of female 2  Date back to work 0  Type of work 0  State she lived in 0  Total score 10       Movement examination: Tone: There is normal tone in the UE/LE Abnormal movements:  no tremor.  No myoclonus.  No asterixis.   Coordination:  There is no decremation with RAM's. Normal finger to nose  Gait and Station: The patient has no difficulty arising out of a deep-seated chair without the use of the hands. The patient's stride length is good.  Gait is cautious  and narrow, favors R leg, no ataxia .        Total time spent on today's visit was 30 minutes, including both face-to-face time and nonface-to-face time. Time included that spent on review of records (prior notes available to me/labs/imaging if pertinent), discussing treatment and goals, answering patient's questions and coordinating care.  Cc:  Vivi Barrack, MD Sharene Butters, PA-C

## 2021-10-02 ENCOUNTER — Other Ambulatory Visit: Payer: Self-pay | Admitting: Endocrinology

## 2021-10-02 ENCOUNTER — Other Ambulatory Visit: Payer: Self-pay

## 2021-10-02 ENCOUNTER — Encounter: Payer: Self-pay | Admitting: Endocrinology

## 2021-10-02 ENCOUNTER — Ambulatory Visit (INDEPENDENT_AMBULATORY_CARE_PROVIDER_SITE_OTHER): Payer: Medicare (Managed Care) | Admitting: Endocrinology

## 2021-10-02 VITALS — BP 142/70 | HR 56 | Ht 60.0 in | Wt 127.0 lb

## 2021-10-02 DIAGNOSIS — E119 Type 2 diabetes mellitus without complications: Secondary | ICD-10-CM

## 2021-10-02 DIAGNOSIS — Z794 Long term (current) use of insulin: Secondary | ICD-10-CM | POA: Diagnosis not present

## 2021-10-02 LAB — POCT GLYCOSYLATED HEMOGLOBIN (HGB A1C): Hemoglobin A1C: 8.1 % — AB (ref 4.0–5.6)

## 2021-10-02 MED ORDER — NOVOLOG MIX 70/30 FLEXPEN (70-30) 100 UNIT/ML ~~LOC~~ SUPN: 12.0000 [IU] | PEN_INJECTOR | Freq: Two times a day (BID) | SUBCUTANEOUS | 3 refills | Status: DC

## 2021-10-02 NOTE — Patient Instructions (Signed)
Please change the insulin to 12 units with breakfast, and 12 units with supper.   ?check your blood sugar twice a day.  vary the time of day when you check, between before the 3 meals, and at bedtime.  also check if you have symptoms of your blood sugar being too high or too low.  please keep a record of the readings and bring it to your next appointment here (or you can bring the meter itself).  You can write it on any piece of paper.  please call us sooner if your blood sugar goes below 70, or if most of your readings are over 200.   ?Please come back for a follow-up appointment in 4 months.   ?

## 2021-10-02 NOTE — Progress Notes (Signed)
? ?Subjective:  ? ? Patient ID: Tammy Hughes, female    DOB: 1932-11-03, 88 y.o.   MRN: [id number] ? ?HPI ?Pt returns for f/u of diabetes mellitus:  ?DM type: Insulin-requiring type 2.   ?Dx'ed: 1990.   ?Complications: PN and CAD.   ?Therapy: insulin since 2005.  ?GDM: never.   ?DKA: never.   ?Severe hypoglycemia: never.   ?Pancreatitis: never.  ?Other: she chose bid premixed insulin; she declines non-insulin rx.   ?Interval history: He brings his meter with his cbg's which I have reviewed today.  cbg varies from 97-412.  It is in general lower as the day goes on.   ?Past Medical History:  ?Diagnosis Date  ? Allergy   ? SEASONAL  ? ANXIETY 03/08/2008  ? Cataract   ? REMOVED BILATERAL  ? Depression   ? DIVERTICULOSIS, COLON 03/08/2008  ? DVT (deep venous thrombosis) ([place])   ? GERD 02/23/2007  ? HYPERCHOLESTEROLEMIA 02/01/2008  ? HYPERTENSION 03/08/2008  ? Insulin dependent diabetes mellitus   ? OSTEOARTHRITIS 02/23/2007  ? OSTEOPOROSIS 02/23/2007  ? PERIPHERAL NEUROPATHY 02/23/2007  ? STD (sexually transmitted disease)   ? Stomach cancer ([place]) 07/20/14  ? invasive adenocarcinoma w/signet rings features  ? Urinary incontinence   ? Urolithiasis   ? ? ?Past Surgical History:  ?Procedure Laterality Date  ? ABDOMINAL SURGERY    ? CARDIAC CATHETERIZATION N/A 02/02/2016  ? Procedure: Left Heart Cath and Coronary Angiography;  Surgeon: Peter M Martinique, MD;  Location: [facility];  Service: Cardiovascular;  Laterality: N/A;  ? CHOLECYSTECTOMY  1995  ? ESOPHAGOGASTRODUODENOSCOPY  06/08/2002  ? GASTRECTOMY N/A 07/20/2014  ? Procedure: PROXIMAL GASTRECTOMY;  Surgeon: Stark Klein, MD;  Location: [facility];  Service: General;  Laterality: N/A;  ? GASTROJEJUNOSTOMY N/A 07/20/2014  ? Procedure: GASTROJEJUNOSTOMY;  Surgeon: Stark Klein, MD;  Location: [facility];  Service: General;  Laterality: N/A;  ? LAPAROSCOPY N/A 07/20/2014  ? Procedure: LAPAROSCOPY DIAGNOSTIC;  Surgeon: Stark Klein, MD;  Location: [facility];  Service: General;  Laterality: N/A;   ? ? ?Social History  ? ?Socioeconomic History  ? Marital status: Married  ?  Spouse name: Not on file  ? Number of children: Not on file  ? Years of education: Not on file  ? Highest education level: Not on file  ?Occupational History  ? Occupation: Retired  ?  Employer: RETIRED  ?Tobacco Use  ? Smoking status: Never  ? Smokeless tobacco: Never  ?Vaping Use  ? Vaping Use: Never used  ?Substance and Sexual Activity  ? Alcohol use: No  ?  Alcohol/week: 0.0 standard drinks  ? Drug use: No  ? Sexual activity: Not Currently  ?  Birth control/protection: Post-menopausal  ?Other Topics Concern  ? Not on file  ?Social History Narrative  ? Retired-Domestic Environmental consultant  ? Right handed   ? Lives with husband   ? Lives in a one story home  ? ?Social Determinants of Health  ? ?Financial Resource Strain: Not on file  ?Food Insecurity: Not on file  ?Transportation Needs: Not on file  ?Physical Activity: Not on file  ?Stress: Not on file  ?Social Connections: Not on file  ?Intimate Partner Violence: Not on file  ? ? ?Current Outpatient Medications on File Prior to Visit  ?Medication Sig Dispense Refill  ? Acetaminophen (TYLENOL PO) Take 500 mg by mouth 2 (two) times daily.     ? calcium carbonate (TUMS - DOSED IN MG ELEMENTAL CALCIUM) 500 MG chewable tablet Chew  1 tablet by mouth daily. As needed    ? clopidogrel (PLAVIX) 75 MG tablet TAKE 1 TABLET BY MOUTH ONCE DAILY 90 tablet 0  ? donepezil (ARICEPT) 10 MG tablet Take 1 tablet daily 90 tablet 3  ? meclizine (ANTIVERT) 25 MG tablet TAKE 1 TABLET BY MOUTH THREE TIMES DAILY IF NEEDED FOR DIZZINESS 30 tablet 0  ? melatonin 3 MG TABS tablet Take 3 mg by mouth at bedtime.    ? mirtazapine (REMERON) 15 MG tablet TAKE 1 TABLET BY MOUTH EACH NIGHT AT BEDTIME AS DIRECTED 90 tablet 0  ? ONETOUCH VERIO test strip CHECK BLOOD SUGAR TWICE DAILY 200 strip 0  ? pantoprazole (PROTONIX) 40 MG tablet Take 1 tablet (40 mg total) by mouth 2 (two) times daily. 60 tablet 2  ? phenazopyridine (PYRIDIUM)  100 MG tablet Take 100 mg by mouth 3 (three) times daily.    ? rosuvastatin (CRESTOR) 5 MG tablet Take 1 tablet (5 mg total) by mouth daily. 90 tablet 0  ? ?No current facility-administered medications on file prior to visit.  ? ? ?Allergies  ?Allergen Reactions  ? Pirfenidone Diarrhea and Nausea And Vomiting  ? Aspirin   ?  rash  ? ? ?Family History  ?Problem Relation Age of Onset  ? Cancer Father   ?     Prostate Cancer  ? Cancer Sister   ?     Breast Cancer  ? Diabetes Sister   ? Diabetes Sister   ? Other Mother   ?     pneumonia   ? Cancer Sister   ?     breast cancer   ? ? ?BP (!) 142/70 (BP Location: Left Arm, Patient Position: Sitting, Cuff Size: Normal)   Pulse (!) 56   Ht 5' (1.524 m)   Wt 127 lb (57.6 kg)   SpO2 96%   BMI 24.80 kg/m?  ? ? ?Review of Systems ? ?   ?Objective:  ? Physical Exam ?VITAL SIGNS:  See vs page ?GENERAL: no distress ? ? ?Lab Results  ?Component Value Date  ? HGBA1C 8.1 (A) 10/02/2021  ? ?   ?Assessment & Plan:  ?Insulin-requiring type 2 DM: uncontrolled ? ?Patient Instructions  ?Please change the insulin to 12 units with breakfast, and 12 units with supper.   ?check your blood sugar twice a day.  vary the time of day when you check, between before the 3 meals, and at bedtime.  also check if you have symptoms of your blood sugar being too high or too low.  please keep a record of the readings and bring it to your next appointment here (or you can bring the meter itself).  You can write it on any piece of paper.  please call us sooner if your blood sugar goes below 70, or if most of your readings are over 200.   ?Please come back for a follow-up appointment in 4 months.   ? ? ?

## 2021-10-10 ENCOUNTER — Telehealth: Payer: Self-pay | Admitting: Family Medicine

## 2021-10-10 NOTE — Telephone Encounter (Signed)
Alex with [facility] requested office visit report from 08/09/21 to be faxed to [phone], Attn: Alex ?

## 2021-10-10 NOTE — Telephone Encounter (Signed)
Office Notes printed and faxed to [facility]; confirmation report received  ?

## 2021-10-17 ENCOUNTER — Ambulatory Visit (INDEPENDENT_AMBULATORY_CARE_PROVIDER_SITE_OTHER): Payer: Medicare (Managed Care) | Admitting: Pulmonary Disease

## 2021-10-17 ENCOUNTER — Encounter: Payer: Self-pay | Admitting: Pulmonary Disease

## 2021-10-17 VITALS — BP 124/74 | HR 56 | Temp 97.9°F | Ht 60.0 in | Wt 125.0 lb

## 2021-10-17 DIAGNOSIS — J84112 Idiopathic pulmonary fibrosis: Secondary | ICD-10-CM | POA: Diagnosis not present

## 2021-10-17 NOTE — Patient Instructions (Signed)
?  X Ambulatory sat ? ?X HRCT chest in July  ?

## 2021-10-17 NOTE — Progress Notes (Signed)
? ?  Subjective:  ? ? Patient ID: Tammy Hughes, female    DOB: [birth year], 88 y.o.   MRN: [id number] ? ?HPI ? ? ?84 For FU of  ILD ?- fibrosis noted on CT chest 2015, CT abdomen from 2011 shows clear lung bases ? ?Pulm fibrosis, basilar predominance.  Serology was negative except for low positive ANA 1:40 she was started on Esbriet, did not tolerate due to side effects  ? ?PMH - esophageal stricture, gastric cancer.s/p resection and chemotherapy, did not tolerate radiation ?  ?  ?Chief Complaint  ?Patient presents with  ? Follow-up  ?  Pt is here for follow up today. Pt states that she is doing ok and no issues noted with her breathing today   ? ?10-monthfollow-up visit, accompanied by her husband ?We reviewed prior PFTs and CT scan ?She reports mild shortness of breath, no pedal edema or cough ?  ?Significant tests/ events reviewed ?HRCT 11/2020 >> widespread but patchy areas of ground-glass attenuation, septal thickening, subpleural reticulation, parenchymal banding, traction bronchiectasis and honeycombing. Findings have a mild craniocaudal gradient and are clearly progressive compared to prior study from 2016 ?  ?  ?CT angiogram chest 10/2014 ILD, favored fibrotic NSIP, stable from 2015 ?  ?PFTs 12/2020 FVC 88%, DLCO 12.57/75% ?PFTs 09/2013: DLCO 72% TLC 93% FEV1 80% ?  ?Ambulatory saturation 10/2021 saturation stayed at 96% after 2 laps ? ? ?Review of Systems ?neg for any significant sore throat, dysphagia, itching, sneezing, nasal congestion or excess/ purulent secretions, fever, chills, sweats, unintended wt loss, pleuritic or exertional cp, hempoptysis, orthopnea pnd or change in chronic leg swelling. Also denies presyncope, palpitations, heartburn, abdominal pain, nausea, vomiting, diarrhea or change in bowel or urinary habits, dysuria,hematuria, rash, arthralgias, visual complaints, headache, numbness weakness or ataxia. ? ?   ?Objective:  ? Physical Exam ? ?Gen. Pleasant, elderly well-nourished, in no  distress ?ENT - no thrush, no pallor/icterus,no post nasal drip ?Neck: No JVD, no thyromegaly, no carotid bruits ?Lungs: no use of accessory muscles, no dullness to percussion, bibasal one third dry crackles ?Cardiovascular: Rhythm regular, heart sounds  normal, no murmurs or gallops, no peripheral edema ?Musculoskeletal: No deformities, no cyanosis or clubbing  ? ? ? ?   ?Assessment & Plan:  ? ? ?

## 2021-10-17 NOTE — Assessment & Plan Note (Signed)
We again reviewed the natural course of IPF.  Previously this was attributed to postinflammatory fibrosis but on the last HRCT from 2022 this does appear to be IPF.  I do not think a biopsy is necessary.  She has not tolerated Esbriet in the past.  We discussed Ofev and side effects of this medication -patient and her husband do not feel ready to accept the side effects.  I gave them written literature about this medication.  She is not interested in enrolling in research trials. ?She does not desaturate on ambulation which is encouraging. ?We will repeat high-resolution CT chest in 3 months and look for interval worsening before pushing the issue of medication any further  ?

## 2021-10-25 NOTE — Telephone Encounter (Signed)
Another rep from [facility], [place], stated the fax was never received. He gave an alternate number to try. [phone] ?

## 2021-10-29 NOTE — Telephone Encounter (Signed)
Took to front office to try and fax to new number sent below ?

## 2021-10-29 NOTE — Telephone Encounter (Signed)
Fax sent successful and saved confirmation for our records  ?

## 2021-11-05 ENCOUNTER — Other Ambulatory Visit: Payer: Self-pay | Admitting: Nurse Practitioner

## 2021-11-05 DIAGNOSIS — K219 Gastro-esophageal reflux disease without esophagitis: Secondary | ICD-10-CM

## 2021-11-19 ENCOUNTER — Other Ambulatory Visit: Payer: Self-pay

## 2021-11-19 DIAGNOSIS — E119 Type 2 diabetes mellitus without complications: Secondary | ICD-10-CM

## 2021-11-19 MED ORDER — ONETOUCH VERIO VI STRP: ORAL_STRIP | 0 refills | Status: DC

## 2021-12-04 ENCOUNTER — Other Ambulatory Visit: Payer: Self-pay | Admitting: Family Medicine

## 2021-12-20 ENCOUNTER — Inpatient Hospital Stay: Payer: Medicare (Managed Care) | Attending: Oncology | Admitting: Oncology

## 2021-12-20 ENCOUNTER — Ambulatory Visit (HOSPITAL_BASED_OUTPATIENT_CLINIC_OR_DEPARTMENT_OTHER)
Admission: RE | Admit: 2021-12-20 | Discharge: 2021-12-20 | Disposition: A | Discharge status: Home / Self Care | Payer: Medicare (Managed Care) | Source: Ambulatory Visit | Attending: Family Medicine | Admitting: Family Medicine

## 2021-12-20 VITALS — BP 141/59 | HR 58 | Temp 98.0°F | Resp 20 | Wt 124.2 lb

## 2021-12-20 DIAGNOSIS — J8410 Pulmonary fibrosis, unspecified: Secondary | ICD-10-CM | POA: Insufficient documentation

## 2021-12-20 DIAGNOSIS — J84112 Idiopathic pulmonary fibrosis: Secondary | ICD-10-CM | POA: Diagnosis not present

## 2021-12-20 DIAGNOSIS — Z85028 Personal history of other malignant neoplasm of stomach: Secondary | ICD-10-CM | POA: Insufficient documentation

## 2021-12-20 DIAGNOSIS — F32A Depression, unspecified: Secondary | ICD-10-CM | POA: Diagnosis not present

## 2021-12-20 DIAGNOSIS — C169 Malignant neoplasm of stomach, unspecified: Secondary | ICD-10-CM

## 2021-12-20 DIAGNOSIS — Z86718 Personal history of other venous thrombosis and embolism: Secondary | ICD-10-CM | POA: Insufficient documentation

## 2021-12-20 DIAGNOSIS — E119 Type 2 diabetes mellitus without complications: Secondary | ICD-10-CM | POA: Diagnosis not present

## 2021-12-20 NOTE — Progress Notes (Signed)
  [facility] OFFICE PROGRESS NOTE   Diagnosis: Gastric cancer  INTERVAL HISTORY:   Ms. Isaac returns as scheduled.  She is here with her daughter.  She feels well.  Good appetite.  No abdominal pain.  No new complaint.  She is followed by Dr. Danne Baxter for pulmonary fibrosis.  Objective:  Vital signs in last 24 hours:  Blood pressure (!) 141/59, pulse (!) 58, temperature 98 F (36.7 C), temperature source Oral, resp. rate 20, weight 124 lb 3.2 oz (56.3 kg), SpO2 97 %.   Lymphatics: No cervical, supraclavicular, axillary, or inguinal nodes Resp: Diffuse inspiratory rales, no respiratory distress Cardio: Regular rate and rhythm GI: No hepatosplenomegaly, no mass, nontender Vascular: No leg edema, the right lower leg is slightly larger than the left side    Lab Results:  Lab Results  Component Value Date   WBC 5.3 09/07/2020   HGB 12.0 09/07/2020   HCT 36.2 09/07/2020   MCV 90.5 09/07/2020   PLT 271.0 09/07/2020   NEUTROABS 2.5 09/07/2020    CMP  Lab Results  Component Value Date   NA 140 09/07/2020   K 4.3 09/07/2020   CL 104 09/07/2020   CO2 31 09/07/2020   GLUCOSE 81 09/07/2020   BUN 13 09/07/2020   CREATININE 0.93 09/07/2020   CALCIUM 10.5 09/07/2020   PROT 7.9 09/07/2020   ALBUMIN 3.8 09/07/2020   AST 32 09/07/2020   ALT 33 09/07/2020   ALKPHOS 71 09/07/2020   BILITOT 0.3 09/07/2020   GFRNONAA 58 (L) 03/22/2020   GFRAA 67 03/22/2020   Medications: I have reviewed the patient's current medications.   Assessment/Plan:  Gastric cancer, status post an endoscopic biopsy of a lesser curvature mass on 05/31/2014 confirming adenocarcinoma Staging CTs of the chest and abdomen on 06/08/2014 revealed no evidence of metastatic disease Proximal gastrectomy and feeding jejunostomy 07/20/2014, pT2,pN1 Adjuvant weekly 5-Fu/leucovorin 09/14/2014, 09/21/2014, 09/28/2014 and 10/05/2014 Initiation of radiation and infusional 5-FU 10/17/2014 5-fluorouracil  pump discontinued on 10/27/2014 due to toxicity. Radiation discontinued 11/02/2014    Pulmonary fibrosis Esophageal stricture, status post a dilatation procedure 05/31/2014 Diabetes Postoperative ventricle tachycardia, NSTEMI January 2016 Hospital-acquired pneumonia January 2016 CT abdomen/pelvis 09/02/2014 with postsurgical changes. Focal fluid collection in the midline abutting the distal greater curvature of the stomach measuring 2.2 x 2.9 x 3.4 cm. Mild stranding in the adjacent fat. PICC placement 10/17/2014 Rash. Most likely related to the 5-fluorouracil. Resolved. Right upper extremity DVT 10/25/2014. She completed a course of Xarelto. Hospitalization 10/27/2014 through 10/28/2014 nausea/vomiting and left arm discomfort. Noted to have skin toxicity from the 5-fluorouracil. The 5-fluorouracil was discontinued. Depression. She continues Remeron.  Improved.     Disposition: Ms. Eckford remains in clinical remission from gastric cancer.  She would like to continue follow-up at the cancer center.  She will return for an office visit in 9 months.  She will continue follow-up with Dr. Elsworth Soho for management of pulmonary fibrosis.  [name], MD  12/20/2021  10:28 AM

## 2021-12-31 ENCOUNTER — Telehealth: Payer: Self-pay | Admitting: Pulmonary Disease

## 2021-12-31 NOTE — Telephone Encounter (Signed)
Lm for patient's daughter, Melinda(DPR).

## 2021-12-31 NOTE — Telephone Encounter (Signed)
Tammy Noel, MD  12/25/2021  2:10 PM EDT     Fibrosis is unchanged compared to 2022 but worse compared to 2016 - as we discussed on prior office visits    Patient's daughter, Tammy Hughes(DPR) is aware of results and voiced her understanding.  Nothing further needed.

## 2022-01-03 ENCOUNTER — Telehealth: Payer: Self-pay | Admitting: Family Medicine

## 2022-01-03 NOTE — Telephone Encounter (Signed)
Copied from [place] [phone]. Topic: Medicare AWV >> Jan 03, 2022 10:22 AM Devoria Glassing wrote: Reason for CRM: Left message for patient to schedule Annual Wellness Visit.  Please schedule with Nurse Health Advisor Charlott Rakes, RN at [facility].  Please call [phone] ask for [facility]

## 2022-01-04 ENCOUNTER — Other Ambulatory Visit: Payer: Self-pay

## 2022-01-04 ENCOUNTER — Telehealth: Payer: Self-pay | Admitting: Family Medicine

## 2022-01-04 MED ORDER — MIRTAZAPINE 15 MG PO TABS: 15.0000 mg | ORAL_TABLET | Freq: Every day | ORAL | 0 refills | Status: DC

## 2022-01-04 MED ORDER — ROSUVASTATIN CALCIUM 5 MG PO TABS: 5.0000 mg | ORAL_TABLET | Freq: Every day | ORAL | 0 refills | Status: DC

## 2022-01-04 NOTE — Telephone Encounter (Signed)
Rx sent to pharmacy per request.  

## 2022-01-04 NOTE — Telephone Encounter (Signed)
Both Rx's sent to pharmacy per request

## 2022-01-21 ENCOUNTER — Ambulatory Visit (INDEPENDENT_AMBULATORY_CARE_PROVIDER_SITE_OTHER): Payer: Medicare (Managed Care) | Admitting: Pulmonary Disease

## 2022-01-21 ENCOUNTER — Encounter: Payer: Self-pay | Admitting: Pulmonary Disease

## 2022-01-21 DIAGNOSIS — J84112 Idiopathic pulmonary fibrosis: Secondary | ICD-10-CM

## 2022-01-21 NOTE — Progress Notes (Signed)
   Subjective:    Patient ID: WYLIE RUSSON, female    DOB: Aug 21, 1932, 88 y.o.   MRN: [id number]  HPI  88  yo For FU of  IPF - fibrosis noted on CT chest 2015, CT abdomen from 2011 shows clear lung bases   Serology was negative except for low positive ANA 1:40 ,  did not tolerate esbriet   PMH - esophageal stricture, gastric cancer.s/p resection and chemotherapy, did not tolerate radiation  91-monthfollow-up visit. GDalinawas accompanied by her husband , who recently had left spontaneous pneumothorax and required VATS and chest tube.  GImodenies significant shortness of breath.  She reports an occasional cough.  Overall her status is unchanged compared to last visit. We reviewed her CT scan and previous PFTs. She has not tolerated Esbriet in the past  Significant tests/ events reviewed  HRCT 12/2021  c/w UIP ,worse compared to 2016, stable from 2022  HRCT 11/2020 >> widespread but patchy areas of ground-glass attenuation, septal thickening, subpleural reticulation, parenchymal banding, traction bronchiectasis and honeycombing. Findings have a mild craniocaudal gradient and are clearly progressive compared to prior study from 2016     CT angiogram chest 10/2014 ILD, favored fibrotic NSIP, stable from 2015   PFTs 12/2020 FVC 88%, DLCO 12.57/75% PFTs 09/2013: DLCO 72% TLC 93% FEV1 80%   Ambulatory saturation 10/2021 saturation stayed at 96% after 2 laps   Review of Systems neg for any significant sore throat, dysphagia, itching, sneezing, nasal congestion or excess/ purulent secretions, fever, chills, sweats, unintended wt loss, pleuritic or exertional cp, hempoptysis, orthopnea pnd or change in chronic leg swelling. Also denies presyncope, palpitations, heartburn, abdominal pain, nausea, vomiting, diarrhea or change in bowel or urinary habits, dysuria,hematuria, rash, arthralgias, visual complaints, headache, numbness weakness or ataxia.     Objective:   Physical Exam  Gen.  Pleasant, elderly, well-nourished, in no distress ENT - no thrush, no pallor/icterus,no post nasal drip Neck: No JVD, no thyromegaly, no carotid bruits Lungs: no use of accessory muscles, no dullness to percussion, bibasal dry  rales no rhonchi  Cardiovascular: Rhythm regular, heart sounds  normal, no murmurs or gallops, no peripheral edema Musculoskeletal: No deformities, no cyanosis or clubbing        Assessment & Plan:

## 2022-01-21 NOTE — Patient Instructions (Signed)
  X ambulatory sat

## 2022-01-21 NOTE — Assessment & Plan Note (Signed)
I discussed course and prognosis of IPF.  We discussed that her IPF has been stable compared to the last year but overall worse compared to 2015 which means that she has the disease for at least 8 years.  I explained that this is likely to get worse in the future but it is difficult to predict when.  Unfortunately she has not tolerated Esbriet before. We discussed side effect profile of Ofev and she and her husband again do not want to try this medication.  With her advanced age, I think this is a reasonable idea. She does not desaturate on exertion today which is reassuring. We will follow her on a 6 monthly basis

## 2022-02-04 ENCOUNTER — Encounter: Payer: Self-pay | Admitting: Family Medicine

## 2022-02-04 ENCOUNTER — Ambulatory Visit (INDEPENDENT_AMBULATORY_CARE_PROVIDER_SITE_OTHER): Payer: Medicare (Managed Care) | Admitting: Family Medicine

## 2022-02-04 VITALS — BP 137/88 | HR 53 | Temp 98.3°F | Ht 60.0 in | Wt 123.6 lb

## 2022-02-04 DIAGNOSIS — J84112 Idiopathic pulmonary fibrosis: Secondary | ICD-10-CM

## 2022-02-04 DIAGNOSIS — E1169 Type 2 diabetes mellitus with other specified complication: Secondary | ICD-10-CM

## 2022-02-04 DIAGNOSIS — E1142 Type 2 diabetes mellitus with diabetic polyneuropathy: Secondary | ICD-10-CM

## 2022-02-04 DIAGNOSIS — I10 Essential (primary) hypertension: Secondary | ICD-10-CM

## 2022-02-04 DIAGNOSIS — E1159 Type 2 diabetes mellitus with other circulatory complications: Secondary | ICD-10-CM

## 2022-02-04 DIAGNOSIS — E785 Hyperlipidemia, unspecified: Secondary | ICD-10-CM

## 2022-02-04 LAB — POCT GLYCOSYLATED HEMOGLOBIN (HGB A1C): Hemoglobin A1C: 7.3 % — AB (ref 4.0–5.6)

## 2022-02-04 MED ORDER — SILVER SULFADIAZINE 1 % EX CREA
1.0000 | TOPICAL_CREAM | Freq: Every day | CUTANEOUS | 0 refills | Status: DC
Start: 2022-02-04 — End: 2022-10-24

## 2022-02-04 NOTE — Assessment & Plan Note (Signed)
A1c stable 7.3.  Continue her insulin 70/30 12 units twice daily.  She will follow-up with her endocrinologist soon.

## 2022-02-04 NOTE — Assessment & Plan Note (Signed)
Overall symptoms are stable.  We will continue management per pulmonology.

## 2022-02-04 NOTE — Assessment & Plan Note (Signed)
At goal today without medications. 

## 2022-02-04 NOTE — Assessment & Plan Note (Signed)
Crestor 5 mg daily.  We will check lipids when she comes back in 6 months for CPE.
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
## 2022-02-24 ENCOUNTER — Other Ambulatory Visit: Payer: Self-pay | Admitting: Endocrinology

## 2022-02-24 DIAGNOSIS — E119 Type 2 diabetes mellitus without complications: Secondary | ICD-10-CM

## 2022-02-25 ENCOUNTER — Other Ambulatory Visit (INDEPENDENT_AMBULATORY_CARE_PROVIDER_SITE_OTHER): Payer: Medicare (Managed Care)

## 2022-02-25 DIAGNOSIS — Z794 Long term (current) use of insulin: Secondary | ICD-10-CM

## 2022-02-25 DIAGNOSIS — E119 Type 2 diabetes mellitus without complications: Secondary | ICD-10-CM

## 2022-02-25 LAB — COMPREHENSIVE METABOLIC PANEL
ALT: 10 U/L (ref 0–35)
AST: 16 U/L (ref 0–37)
Albumin: 3.9 g/dL (ref 3.5–5.2)
Alkaline Phosphatase: 88 U/L (ref 39–117)
BUN: 21 mg/dL (ref 6–23)
CO2: 27 mEq/L (ref 19–32)
Calcium: 10.3 mg/dL (ref 8.4–10.5)
Chloride: 106 mEq/L (ref 96–112)
Creatinine, Ser: 1.17 mg/dL (ref 0.40–1.20)
GFR: 42.14 mL/min — ABNORMAL LOW (ref >60.00)
Glucose, Bld: 113 mg/dL — ABNORMAL HIGH (ref 70–99)
Potassium: 3.9 mEq/L (ref 3.5–5.1)
Sodium: 141 mEq/L (ref 135–145)
Total Bilirubin: 0.3 mg/dL (ref 0.2–1.2)
Total Protein: 7.2 g/dL (ref 6.0–8.3)

## 2022-02-25 LAB — MICROALBUMIN / CREATININE URINE RATIO
Creatinine,U: 83.5 mg/dL
Microalb Creat Ratio: 18.4 mg/g (ref 0.0–30.0)
Microalb, Ur: 15.3 mg/dL — ABNORMAL HIGH (ref 0.0–1.9)

## 2022-02-25 LAB — LIPID PANEL
Cholesterol: 126 mg/dL (ref 0–200)
HDL: 42.8 mg/dL (ref >39.00)
LDL Cholesterol: 60 mg/dL (ref 0–99)
NonHDL: 82.9
Total CHOL/HDL Ratio: 3
Triglycerides: 115 mg/dL (ref 0.0–149.0)
VLDL: 23 mg/dL (ref 0.0–40.0)

## 2022-02-27 ENCOUNTER — Encounter: Payer: Self-pay | Admitting: Endocrinology

## 2022-02-27 ENCOUNTER — Ambulatory Visit (INDEPENDENT_AMBULATORY_CARE_PROVIDER_SITE_OTHER): Payer: Medicare (Managed Care) | Admitting: Endocrinology

## 2022-02-27 VITALS — BP 128/70 | HR 62 | Ht 60.0 in | Wt 122.0 lb

## 2022-02-27 DIAGNOSIS — Z794 Long term (current) use of insulin: Secondary | ICD-10-CM | POA: Diagnosis not present

## 2022-02-27 DIAGNOSIS — E785 Hyperlipidemia, unspecified: Secondary | ICD-10-CM

## 2022-02-27 DIAGNOSIS — E1165 Type 2 diabetes mellitus with hyperglycemia: Secondary | ICD-10-CM | POA: Diagnosis not present

## 2022-02-27 DIAGNOSIS — E1159 Type 2 diabetes mellitus with other circulatory complications: Secondary | ICD-10-CM | POA: Diagnosis not present

## 2022-02-27 DIAGNOSIS — I10 Essential (primary) hypertension: Secondary | ICD-10-CM

## 2022-02-27 NOTE — Patient Instructions (Signed)
Check blood sugars on 3-4 waking up days a week  Also check blood sugars about 2-3  after meals and do this after different meals by rotation  Recommended blood sugar levels on waking up are 90-130 and about 2 hours after meal is 130-180  Please bring your blood sugar monitor to each visit, thank you  Always take the evening shot 5 to 10 minutes before suppertime

## 2022-02-27 NOTE — Progress Notes (Signed)
Patient ID: Tammy Hughes, female   DOB: May 01, 1933, 88 y.o.   MRN: [id number]            Reason for Appointment: Type II Diabetes follow-up   History of Present Illness   Diagnosis date: 1995  Previous history:  Non-insulin hypoglycemic drugs previously used:?  Metformin, detailed history not available Insulin was started in 2005 Appears to have been on premixed insulin for several years She is appears to have relatively stable control except since about early 2022  A1c range in the last few years is: 7-8.1  Recent history:     Non-insulin hypoglycemic drugs: None     Insulin regimen:   NovoLog Mix 70/30 12 units twice a day         Side effects from medications: None  Current self management, blood sugar patterns and problems identified:  A1c is 7.3 compared to 8.1 She apparently has been taking her evening insulin around bedtime and not before dinnertime  With this.  She appears to have had readings of over 200 at bedtime after dinner Also at least once a month may wake up during the night with low blood sugar symptoms and glucose in the 60s She did not bring a monitor for download and likely a half when she is checking Her insulin dose has been the same for some time Also has been having stable weight, she thinks she is watching portions Usually avoiding regular soft drinks and sweet tea  Exercise: Unable to do much walking Diet management: For breakfast she will usually have eggs grits and bacon     Hypoglycemia: None diet as above  Glucometer: One Touch.           Blood Glucose readings from recall  PRE-MEAL Fasting Lunch Dinner Bedtime Overall  Glucose range: 113-120 ? ? 200-225   Mean/median:        POST-MEAL PC Breakfast PC Lunch PC Dinner  Glucose range:  ?   Mean/median:       Dietician visit: Most recent:    Several years ago  Weight control:  Wt Readings from Last 3 Encounters:  02/27/22 122 lb (55.3 kg)  02/04/22 123 lb 9.6 oz  (56.1 kg)  01/21/22 124 lb (56.2 kg)            Diabetes labs:  Lab Results  Component Value Date   HGBA1C 7.3 (A) 02/04/2022   HGBA1C 8.1 (A) 10/02/2021   HGBA1C 7.9 (A) 06/04/2021   Lab Results  Component Value Date   MICROALBUR 15.3 (H) 02/25/2022   LDLCALC 60 02/25/2022   CREATININE 1.17 02/25/2022    Lab Results  Component Value Date   FRUCTOSAMINE 317 (H) 04/18/2020     Allergies as of 02/27/2022       Reactions   Pirfenidone Diarrhea, Nausea And Vomiting   Aspirin    rash        Medication List        Accurate as of February 27, 2022 10:28 AM. If you have any questions, ask your nurse or doctor.          calcium carbonate 500 MG chewable tablet Commonly known as: TUMS - dosed in mg elemental calcium Chew 1 tablet by mouth daily. As needed   clopidogrel 75 MG tablet Commonly known as: PLAVIX TAKE 1 TABLET BY MOUTH ONCE DAILY   donepezil 10 MG tablet Commonly known as: ARICEPT Take 1 tablet daily   meclizine 25 MG tablet  Commonly known as: ANTIVERT TAKE 1 TABLET BY MOUTH THREE TIMES DAILY IF NEEDED FOR DIZZINESS   melatonin 3 MG Tabs tablet Take 3 mg by mouth at bedtime.   mirtazapine 15 MG tablet Commonly known as: REMERON Take 1 tablet (15 mg total) by mouth at bedtime.   NovoLOG Mix 70/30 FlexPen (70-30) 100 UNIT/ML FlexPen Generic drug: insulin aspart protamine - aspart Inject 12 Units into the skin 2 (two) times daily with a meal. And syringes 2/day   OneTouch Verio test strip Generic drug: glucose blood Use as instructed   pantoprazole 40 MG tablet Commonly known as: PROTONIX TAKE 1 TABLET BY MOUTH TWICE DAILY   rosuvastatin 5 MG tablet Commonly known as: CRESTOR Take 1 tablet (5 mg total) by mouth daily.   silver sulfADIAZINE 1 % cream Commonly known as: SILVADENE Apply 1 Application topically daily.   TYLENOL PO Take 500 mg by mouth 2 (two) times daily.        Allergies:  Allergies  Allergen Reactions    Pirfenidone Diarrhea and Nausea And Vomiting   Aspirin     rash    Past Medical History:  Diagnosis Date   Allergy    SEASONAL   ANXIETY 03/08/2008   Cataract    REMOVED BILATERAL   Depression    DIVERTICULOSIS, COLON 03/08/2008   DVT (deep venous thrombosis) ([place])    GERD 02/23/2007   HYPERCHOLESTEROLEMIA 02/01/2008   HYPERTENSION 03/08/2008   Insulin dependent diabetes mellitus    OSTEOARTHRITIS 02/23/2007   OSTEOPOROSIS 02/23/2007   PERIPHERAL NEUROPATHY 02/23/2007   STD (sexually transmitted disease)    Stomach cancer ([place]) 07/20/14   invasive adenocarcinoma w/signet rings features   Urinary incontinence    Urolithiasis     Past Surgical History:  Procedure Laterality Date   ABDOMINAL SURGERY     CARDIAC CATHETERIZATION N/A 02/02/2016   Procedure: Left Heart Cath and Coronary Angiography;  Surgeon: Peter M Martinique, MD;  Location: [facility];  Service: Cardiovascular;  Laterality: N/A;   CHOLECYSTECTOMY  1995   ESOPHAGOGASTRODUODENOSCOPY  06/08/2002   GASTRECTOMY N/A 07/20/2014   Procedure: PROXIMAL GASTRECTOMY;  Surgeon: Stark Klein, MD;  Location: [facility];  Service: General;  Laterality: N/A;   GASTROJEJUNOSTOMY N/A 07/20/2014   Procedure: GASTROJEJUNOSTOMY;  Surgeon: Stark Klein, MD;  Location: [facility];  Service: General;  Laterality: N/A;   LAPAROSCOPY N/A 07/20/2014   Procedure: LAPAROSCOPY DIAGNOSTIC;  Surgeon: Stark Klein, MD;  Location: [facility];  Service: General;  Laterality: N/A;    Family History  Problem Relation Age of Onset   Cancer Father        Prostate Cancer   Cancer Sister        Breast Cancer   Diabetes Sister    Diabetes Sister    Other Mother        pneumonia    Cancer Sister        breast cancer     Social History:  reports that she has never smoked. She has never used smokeless tobacco. She reports that she does not drink alcohol and does not use drugs.  Review of Systems:  Last diabetic eye exam date 8/23  Last urine microalbumin  date:  Last foot exam date:  Symptoms of neuropathy:  Hypertension:   ACE/ARB medication:  BP Readings from Last 3 Encounters:  02/27/22 128/70  02/04/22 137/88  01/21/22 114/68    Lipid management: Currently taking Crestor 5 mg daily Appears to have had baseline cholesterol  of about 220    Lab Results  Component Value Date   CHOL 126 02/25/2022   CHOL 118 09/20/2019   CHOL 221 (H) 03/19/2019   Lab Results  Component Value Date   HDL 42.80 02/25/2022   HDL 42.00 09/20/2019   HDL 41.60 03/19/2019   Lab Results  Component Value Date   LDLCALC 60 02/25/2022   LDLCALC 52 09/20/2019   LDLCALC 96 11/21/2017   Lab Results  Component Value Date   TRIG 115.0 02/25/2022   TRIG 119.0 09/20/2019   TRIG 215.0 (H) 03/19/2019   Lab Results  Component Value Date   CHOLHDL 3 02/25/2022   CHOLHDL 3 09/20/2019   CHOLHDL 5 03/19/2019   Lab Results  Component Value Date   LDLDIRECT 159.0 03/19/2019   LDLDIRECT 102.9 03/23/2014   LDLDIRECT 104.1 08/06/2013   She has a history of mild memory loss    Examination:   BP 128/70 (BP Location: Left Arm, Patient Position: Sitting, Cuff Size: Normal)   Pulse 62   Ht 5' (1.524 m)   Wt 122 lb (55.3 kg)   SpO2 98%   BMI 23.83 kg/m   Body mass index is 23.83 kg/m.   Diabetic Foot Exam - Simple   Simple Foot Form Diabetic Foot exam was performed with the following findings: Yes   Visual Inspection No deformities, no ulcerations, no other skin breakdown bilaterally: Yes Sensation Testing Intact to touch and monofilament testing bilaterally: Yes Pulse Check Posterior Tibialis and Dorsalis pulse intact bilaterally: Yes See comments: Yes Comments Posterior tibialis pulses not palpable, right dorsalis pedis pulse is 2/4    No pedal edema  ASSESSMENT/ PLAN:    Diabetes type 2 on long-term insulin:   Current regimen: NovoLog mix twice a day  See history of present illness for detailed discussion of current diabetes  management, blood sugar patterns and problems identified  A1c is 7.3 last month  For her age and comorbid conditions her level of control appears excellent However as discussed above with her taking her evening premixed insulin at bedtime her blood sugars are higher after dinner but possibly low during the night with occasional symptomatic hypoglycemia also Her diet appears to be fairly good Likely has only mild insulin deficiency because of very stable control  Recommendations:  Discussed in detail the actions of premixed insulin and the timing of taking the insulin for proper control of postprandial readings and avoiding overnight low sugars She will take her evening injection 5 to 10 minutes before eating consistently as also in the morning To check blood sugars more often after different meals by rotation and bring the monitor for download on each visit Follow-up in 4 months unless having issues with blood sugar control  History of hypercholesterolemia: Well-controlled with 5 mg Crestor which she is tolerating and encouraged her to stay on this regularly daily  No significant diabetic complications and urine microalbumin is normal  Patient Instructions  Check blood sugars on 3-4 waking up days a week  Also check blood sugars about 2-3  after meals and do this after different meals by rotation  Recommended blood sugar levels on waking up are 90-130 and about 2 hours after meal is 130-180  Please bring your blood sugar monitor to each visit, thank you  Always take the evening shot 5 to 10 minutes before suppertime   Elayne Snare 02/27/2022, 10:28 AM

## 2022-03-04 ENCOUNTER — Other Ambulatory Visit: Payer: Self-pay

## 2022-03-04 MED ORDER — FREESTYLE LIBRE 2 SENSOR MISC: 6 refills | Status: DC

## 2022-03-05 ENCOUNTER — Other Ambulatory Visit: Payer: Self-pay | Admitting: Endocrinology

## 2022-03-11 ENCOUNTER — Telehealth: Payer: Self-pay

## 2022-03-11 NOTE — Telephone Encounter (Signed)
Patient called in states that PA is needed for her mom's [employer]. States they have faxed it twice. Please start PA and if possible can we expedite?

## 2022-03-12 ENCOUNTER — Other Ambulatory Visit (HOSPITAL_COMMUNITY): Payer: Self-pay

## 2022-03-12 ENCOUNTER — Telehealth: Payer: Self-pay | Admitting: Pharmacy Technician

## 2022-03-12 NOTE — Telephone Encounter (Signed)
Patient Advocate Encounter   Received notification from Office/CMA that prior authorization for [facility] is required/requested.  PA submitted on 03/12/22 to [employer] Medicare via CoverMyMeds Key [id number] Status is pending  Pharmacy Patient Advocate Fax:  [phone]

## 2022-03-12 NOTE — Telephone Encounter (Signed)
Patient Advocate Encounter  Prior Authorization for [employer] has been approved.    Effective: 03-04-2022 to 07-14-2022

## 2022-03-14 ENCOUNTER — Other Ambulatory Visit: Payer: Self-pay

## 2022-03-14 DIAGNOSIS — E1165 Type 2 diabetes mellitus with hyperglycemia: Secondary | ICD-10-CM

## 2022-03-14 MED ORDER — FREESTYLE LIBRE 2 READER DEVI: 0 refills | Status: DC

## 2022-03-14 NOTE — Telephone Encounter (Signed)
Called and notified daughter she is aware of approval.

## 2022-03-15 ENCOUNTER — Telehealth: Payer: Self-pay | Admitting: Family Medicine

## 2022-03-15 ENCOUNTER — Ambulatory Visit (INDEPENDENT_AMBULATORY_CARE_PROVIDER_SITE_OTHER): Payer: Medicare (Managed Care) | Admitting: Family Medicine

## 2022-03-15 ENCOUNTER — Encounter: Payer: Self-pay | Admitting: Family Medicine

## 2022-03-15 ENCOUNTER — Ambulatory Visit (HOSPITAL_COMMUNITY)
Admission: RE | Admit: 2022-03-15 | Discharge: 2022-03-15 | Disposition: A | Discharge status: Home / Self Care | Payer: Medicare (Managed Care) | Source: Ambulatory Visit | Attending: Family Medicine | Admitting: Family Medicine

## 2022-03-15 VITALS — BP 119/75 | HR 62 | Temp 98.1°F | Ht 60.0 in | Wt 121.0 lb

## 2022-03-15 DIAGNOSIS — E1159 Type 2 diabetes mellitus with other circulatory complications: Secondary | ICD-10-CM

## 2022-03-15 DIAGNOSIS — I152 Hypertension secondary to endocrine disorders: Secondary | ICD-10-CM | POA: Diagnosis not present

## 2022-03-15 DIAGNOSIS — K219 Gastro-esophageal reflux disease without esophagitis: Secondary | ICD-10-CM

## 2022-03-15 DIAGNOSIS — R1032 Left lower quadrant pain: Secondary | ICD-10-CM | POA: Diagnosis present

## 2022-03-15 MED ORDER — IOHEXOL 9 MG/ML PO SOLN: 1000.0000 mL | ORAL | Status: AC

## 2022-03-15 MED ORDER — IOHEXOL 300 MG/ML  SOLN
100.0000 mL | Freq: Once | INTRAMUSCULAR | Status: AC | PRN
Start: 2022-03-15 — End: 2022-03-15
  Administered 2022-03-15: 100 mL via INTRAVENOUS

## 2022-03-15 NOTE — Progress Notes (Signed)
   Tammy Hughes is a 88 y.o. female who presents today for an office visit.  Assessment/Plan:  New/Acute Problems: Abdominal Pain Secondary to constipation however she does have some focal tenderness with rebound and guarding in her right lower quadrant.  We will check stat CT scan to rule out appendicitis and look for other possible causes.  If CT scan is negative we will start treatment with bowel regimen.  Recommended good hydration.  She can use MiraLAX as needed to have 1-2 soft bowel movements daily.  Chronic Problems Addressed Today: Hypertension associated with diabetes ([place]) At goal today off medications.  GERD Stable on Protonix 40 mg twice daily.     Subjective:  HPI:  Patient here with abdominal pain. This started a couple of days ago.  Symptoms seem to be about the same. Mostly notice after eating. No nausea or vomiting. Some constipation. No diarrhea. She is having bowel movement every other day. She has tried pepto which has not helped. No blood in stool. No fevers or chills.        Objective:  Physical Exam: BP 119/75   Pulse 62   Temp 98.1 F (36.7 C)   Ht 5' (1.524 m)   Wt 121 lb (54.9 kg)   SpO2 98%   BMI 23.63 kg/m   Gen: No acute distress, resting comfortably CV: Regular rate and rhythm with no murmurs appreciated Pulm: Normal work of breathing, clear to auscultation bilaterally with no crackles, wheezes, or rhonchi GI: Soft, nondistended.  Bowel sounds present.  Tenderness to palpation in right lower quadrant.  Mild amount of guarding and rebound noted. Neuro: Grossly normal, moves all extremities Psych: Normal affect and thought content      Kelly Ranieri M. Jerline Pain, MD 03/15/2022 12:41 PM

## 2022-03-15 NOTE — Assessment & Plan Note (Signed)
Stable on Protonix 40 mg twice daily.

## 2022-03-15 NOTE — Telephone Encounter (Signed)
Caller states: -She received a call in regards to patient's CT scan ordered on 09/01   Caller requests: - A callback in regards to next steps needed

## 2022-03-15 NOTE — Patient Instructions (Signed)
It was very nice to see you today!  We will get a CT scan to make sure that you do not have appendicitis.  I think your constipation is probably causing your symptoms.  Please start a daily laxative such as MiraLAX to have 1 soft bowel movement daily.  We will contact you with the results once your CT scan comes back but please follow-up with me in a couple weeks to let me know how you are doing with the constipation.  Take care, Dr Jerline Pain  PLEASE NOTE:  If you had any lab tests please let us know if you have not heard back within a few days. You may see your results on mychart before we have a chance to review them but we will give you a call once they are reviewed by [place]. If we ordered any referrals today, please let us know if you have not heard from their office within the next week.   Please try these tips to maintain a healthy lifestyle:  Eat at least 3 REAL meals and 1-2 snacks per day.  Aim for no more than 5 hours between eating.  If you eat breakfast, please do so within one hour of getting up.   Each meal should contain half fruits/vegetables, one quarter protein, and one quarter carbs (no bigger than a computer mouse)  Cut down on sweet beverages. This includes juice, soda, and sweet tea.   Drink at least 1 glass of water with each meal and aim for at least 8 glasses per day  Exercise at least 150 minutes every week.

## 2022-03-15 NOTE — Assessment & Plan Note (Signed)
At goal today off medications.

## 2022-03-19 NOTE — Progress Notes (Signed)
Please inform patient of the following:  Her CT scan shows that she may have early diverticulitis. This is on the left and is probably not causing her pain.  Her appendix is normal.  No other obvious cause for her symptoms.  Do not need to make any changes to her treatment plan at this time that would like for her to let us know if her symptoms are not improving with the bowel regimen that we discussed at her office visit.

## 2022-03-20 NOTE — Telephone Encounter (Signed)
See results note. 

## 2022-03-27 ENCOUNTER — Other Ambulatory Visit: Payer: Self-pay | Admitting: Family Medicine

## 2022-03-27 ENCOUNTER — Ambulatory Visit (INDEPENDENT_AMBULATORY_CARE_PROVIDER_SITE_OTHER): Payer: Medicare (Managed Care) | Admitting: Physician Assistant

## 2022-03-27 ENCOUNTER — Encounter: Payer: Self-pay | Admitting: Physician Assistant

## 2022-03-27 ENCOUNTER — Other Ambulatory Visit: Payer: Self-pay | Admitting: Pulmonary Disease

## 2022-03-27 VITALS — BP 150/80 | HR 60 | Resp 18 | Ht 60.0 in | Wt 121.0 lb

## 2022-03-27 DIAGNOSIS — K219 Gastro-esophageal reflux disease without esophagitis: Secondary | ICD-10-CM

## 2022-03-27 DIAGNOSIS — F02A0 Dementia in other diseases classified elsewhere, mild, without behavioral disturbance, psychotic disturbance, mood disturbance, and anxiety: Secondary | ICD-10-CM | POA: Diagnosis not present

## 2022-03-27 DIAGNOSIS — G301 Alzheimer's disease with late onset: Secondary | ICD-10-CM

## 2022-03-27 MED ORDER — ESCITALOPRAM OXALATE 5 MG PO TABS: 5.0000 mg | ORAL_TABLET | Freq: Every day | ORAL | 3 refills | Status: DC

## 2022-03-27 NOTE — Progress Notes (Signed)
Assessment/Plan:   Dementia due to Alzheimer's disease  Tammy Hughes is a very pleasant 88 y.o. RH female seen today in follow up for memory loss. Patient is currently on donepezil 10 mg daily, tolerating well.  No significant cognitive changes have been noted in today's visit.  However, the patient has recently moved to retirement community, and  may be experiencing situational depression, missing her all home.  This is affecting her overall emotional status, not wanting to do activities outside, experiencing decreased appetite and having excessive sleepiness.  It is felt that she is experiencing adjustment issues.  No hallucinations or paranoia.  Recommendations:   Follow up in 6  months. Continue donepezil 10 mg daily  Continue mirtazapine 15 mg nightly and  5 mg melatonin for sleep.  Consider decreasing mirtazapine to 7.5 mg nightly and holding melatonin due to increased daytime sleepiness  start Lexapro 5 mg daily  for depression.  Will consider increasing to 10 mg daily if tolerated Consider joining adult day programs       Subjective:    This patient is accompanied in the office by her daughter who supplements the history.  Previous records as well as any outside records available were reviewed prior to todays visit.    Any changes in memory since last visit?  Cognitively, her daughter reports that she is about the same.  She continues to do crossword puzzles and word finding.  However, she moved to [facility] in August to a different retirement community, and never wants to leave the house.  She is not interested in participating in any activities that this community offers.  Patient lives with: Husband in a retirement community, recently moved to [place].  repeats oneself?  Endorsed, more frequently than prior. Disoriented when walking into a room?  Patient denies   Leaving objects in unusual places?  Patient denies   Ambulates  with difficulty?   Patient denies    Recent falls?  Patient denies .   Any head injuries?  Patient denies   History of seizures?   Patient denies   Wandering behavior?  Patient denies   Patient drives?   Only short distances and infrequently. Any mood changes since last visit?  Patient may be experiencing situational depression after moving. Hallucinations?  Patient denies   Paranoia?  Patient denies   Patient reports that sleeps well without vivid dreams, REM behavior or sleepwalking.  However she sleeps more than before History of sleep apnea?  Patient denies   Any hygiene concerns?  Patient denies   Independent of bathing and dressing?  Endorsed  Does the patient needs help with medications?  Daughter  is in charge who is in charge of the finances?  Husband is in charge    Any changes in appetite?  Decreased from prior, is happening since she has moved to [place]. Patient have trouble swallowing? Patient denies   Does the patient cook?  Patient denies   Any kitchen accidents such as leaving the stove on? Patient denies   Any headaches?  Occasionally she has a headache, treated with Tylenol. Double vision? Patient denies   Any focal numbness or tingling?  Patient denies   Chronic back pain Patient denies   Unilateral weakness?  Patient denies   Any tremors?  Patient denies   Any history of anosmia?  Patient denies   Any incontinence of urine?  Mild incontinence of urine, uses a pad Any bowel dysfunction?   Patient denies  History on Initial Assessment 12/28/2019: This is an 88 year old right-handed woman with a history of hypertension, hyperlipidemia, diabetes, stomach cancer, anxiety, depression, presenting for evaluation of memory loss. [name] Hughes is present to provide additional information. She states "I just can't remember." Tammy Hughes started noticing changes around 4 years ago after she underwent chemotherapy and radiation treatment, worse in the past year. She repeats herself several times. She lives  with her husband. She was diagnosed with cancer 4 years ago and was forgetting bill payments, her husband took over at that time. She denies getting lost driving. She forgets her medications, they had to count her pills and find that she has not taken them as instructed. She denies leaving the stove on but Tammy Hughes reports she has burned food, thinking she turned the burner off. She denies misplacing things. She is independent with dressing and bathing. Her 2 sisters and brother had Alzheimer's disease. No concussions or alcohol use. MMSE 24/30 in PCP office last 09/2019.   She has infrequent headaches, occasional dizziness/vertigo, low back pain, urinary incontinence. She denies any diplopia, dysarthria/dysphagia, focal numbness/tingling/weakness, tremors, or anosmia. No falls. Sleep is not good sometimes, she occasionally takes naps. No wandering behavior. Her daughter notes she is a little more irritable than before, she does not like a lot of people around. No hallucinations or paranoia.     Laboratory Data:          Lab Results  Component Value Date    TSH 0.69 06/21/2020             Lab Results  Component Value Date    VITAMINB12 1,317 (H) 07/05/2019    PREVIOUS MEDICATIONS:   CURRENT MEDICATIONS:  Outpatient Encounter Medications as of 03/27/2022  Medication Sig   Acetaminophen (TYLENOL PO) Take 500 mg by mouth 2 (two) times daily.    calcium carbonate (TUMS - DOSED IN MG ELEMENTAL CALCIUM) 500 MG chewable tablet Chew 1 tablet by mouth daily. As needed   clopidogrel (PLAVIX) 75 MG tablet TAKE 1 TABLET BY MOUTH ONCE DAILY   Continuous Blood Gluc Receiver (FREESTYLE LIBRE 2 READER) DEVI Use as instructed to check blood sugars   Continuous Blood Gluc Sensor (FREESTYLE LIBRE 2 SENSOR) MISC CHANGE EVERY 14 DAYS   donepezil (ARICEPT) 10 MG tablet Take 1 tablet daily   escitalopram (LEXAPRO) 5 MG tablet Take 1 tablet (5 mg total) by mouth daily.   glucose blood (ONETOUCH VERIO) test strip Use  as instructed   insulin aspart protamine - aspart (NOVOLOG MIX 70/30 FLEXPEN) (70-30) 100 UNIT/ML FlexPen Inject 12 Units into the skin 2 (two) times daily with a meal. And syringes 2/day   meclizine (ANTIVERT) 25 MG tablet TAKE 1 TABLET BY MOUTH THREE TIMES DAILY IF NEEDED FOR DIZZINESS   melatonin 3 MG TABS tablet Take 3 mg by mouth at bedtime.   mirtazapine (REMERON) 15 MG tablet Take 1 tablet (15 mg total) by mouth at bedtime.   pantoprazole (PROTONIX) 40 MG tablet TAKE 1 TABLET BY MOUTH TWICE DAILY   silver sulfADIAZINE (SILVADENE) 1 % cream Apply 1 Application topically daily.   [DISCONTINUED] rosuvastatin (CRESTOR) 5 MG tablet Take 1 tablet (5 mg total) by mouth daily.   No facility-administered encounter medications on file as of 03/27/2022.       09/20/2021    8:00 AM 03/21/2021    1:00 PM 07/13/2020   11:00 AM  MMSE - Mini Mental State Exam  Orientation to time 5 4 4  Orientation to Place _0 Registration _1 Attention/ Calculation 4 1 0  Recall _2 Language- name 2 objects _3 Language- repeat 1 1 0  Language- follow 3 step command _4 Language- read & follow direction _5 Write a sentence _6 Copy design 0 0 0  Total score _7 No data to display          Objective:     PHYSICAL EXAMINATION:    VITALS:   Vitals:   03/27/22 1307  BP: (!) 150/80  Pulse: 60  Resp: 18  SpO2: 96%  Weight: 121 lb (54.9 kg)  Height: 5' (1.524 m)    GEN:  The patient appears stated age and is in NAD. HEENT:  Normocephalic, atraumatic.   Neurological examination:  General: NAD, well-groomed, appears stated age. Orientation: The patient is alert. Oriented to person, place and date Cranial nerves: There is good facial symmetry.The speech is fluent and clear. No aphasia or dysarthria. Fund of knowledge is appropriate. Recent and remote memory are impaired. Attention and concentration are reduced.  Able to name objects and repeat phrases.  Hearing  is intact to conversational tone.    Sensation: Sensation is intact to light touch throughout Motor: Strength is at least antigravity x4. Tremors: none  DTR's 2/4 in UE/LE     Movement examination: Tone: There is normal tone in the UE/LE Abnormal movements:  no tremor.  No myoclonus.  No asterixis.   Coordination:  There is no decremation with RAM's. Normal finger to nose  Gait and Station: The patient has no difficulty arising out of a deep-seated chair without the use of the hands. The patient's stride length is good.  Gait is cautious and narrow, favors the right leg, no ataxia.    Thank you for allowing [place] the opportunity to participate in the care of this nice patient. Please do not hesitate to contact us for any questions or concerns.   Total time spent on today's visit was 30 minutes dedicated to this patient today, preparing to see patient, examining the patient, ordering tests and/or medications and counseling the patient, documenting clinical information in the EHR or other health record, independently interpreting results and communicating results to the patient/family, discussing treatment and goals, answering patient's questions and coordinating care.  Cc:  Vivi Barrack, MD  Sharene Butters 03/28/2022 9:39 AM

## 2022-03-27 NOTE — Patient Instructions (Addendum)
It was a pleasure to see you today at our office.   Recommendations:  Follow up in 6  months Continue donepezil 10 mg daily. Side effects were discussed   Continue mirtazapine 15 mg nightly and  5 mg melatonin for sleep. Consider decreasing the sleeping pill and discontinuing melatonin  Start Lexapro 5 mg daily ofr depression  Consider joining day programs   Whom to call:  Memory  decline, memory medications: Call our office [phone]   For psychiatric meds, mood meds: Please have your primary care physician manage these medications.   Counseling regarding caregiver distress, including caregiver depression, anxiety and issues regarding community resources, adult day care programs, adult living facilities, or memory care questions:   Feel free to contact [place], Social Worker at [phone]   For assessment of decision of mental capacity and competency:  Call Dr. Anthoney Harada, geriatric psychiatrist at [phone]  For guidance in geriatric dementia issues please call [facility] [phone]  For guidance regarding [facility] and if placement were needed at the facility, contact Arnell Asal, Social Worker tel: [phone]  If you have any severe symptoms of a stroke, or other severe issues such as confusion,severe chills or fever, etc call 911 or go to the ER as you may need to be evaluated further   Feel free to visit Facebook page " [email or web address]" for tips of how to care for people with memory problems.    RECOMMENDATIONS FOR ALL PATIENTS WITH MEMORY PROBLEMS: 1. Continue to exercise (Recommend 30 minutes of walking everyday, or 3 hours every week) 2. Increase social interactions - continue going to [place] and enjoy social gatherings with friends and family 3. Eat healthy, avoid fried foods and eat more fruits and vegetables 4. Maintain adequate blood pressure, blood sugar, and blood cholesterol level. Reducing the risk of stroke  and cardiovascular disease also helps promoting better memory. 5. Avoid stressful situations. Live a simple life and avoid aggravations. Organize your time and prepare for the next day in anticipation. 6. Sleep well, avoid any interruptions of sleep and avoid any distractions in the bedroom that may interfere with adequate sleep quality 7. Avoid sugar, avoid sweets as there is a strong link between excessive sugar intake, diabetes, and cognitive impairment We discussed the Mediterranean diet, which has been shown to help patients reduce the risk of progressive memory disorders and reduces cardiovascular risk. This includes eating fish, eat fruits and green leafy vegetables, nuts like almonds and hazelnuts, walnuts, and also use olive oil. Avoid fast foods and fried foods as much as possible. Avoid sweets and sugar as sugar use has been linked to worsening of memory function.  There is always a concern of gradual progression of memory problems. If this is the case, then we may need to adjust level of care according to patient needs. Support, both to the patient and caregiver, should then be put into place.    The Alzheimer's Association is here all day, every day for people facing Alzheimer's disease through our free 24/7 Helpline: [phone]. The Helpline provides reliable information and support to all those who need assistance, such as individuals living with memory loss, Alzheimer's or other dementia, caregivers, health care professionals and the public.  Our highly trained and knowledgeable staff can help you with: Understanding memory loss, dementia and Alzheimer's  Medications and other treatment options  General information about aging and brain health  Skills to provide quality care and to find the best care  from Training and development officer, financial and living-arrangement decisions Our Helpline also features: Confidential care consultation provided by master's level clinicians who can help with  decision-making support, crisis assistance and education on issues families face every day  Help in a caller's preferred language using our translation service that features more than 200 languages and dialects  Referrals to local community programs, services and ongoing support     FALL PRECAUTIONS: Be cautious when walking. Scan the area for obstacles that may increase the risk of trips and falls. When getting up in the mornings, sit up at the edge of the bed for a few minutes before getting out of bed. Consider elevating the bed at the head end to avoid drop of blood pressure when getting up. Walk always in a well-lit room (use night lights in the walls). Avoid area rugs or power cords from appliances in the middle of the walkways. Use a walker or a cane if necessary and consider physical therapy for balance exercise. Get your eyesight checked regularly.  FINANCIAL OVERSIGHT: Supervision, especially oversight when making financial decisions or transactions is also recommended.  HOME SAFETY: Consider the safety of the kitchen when operating appliances like stoves, microwave oven, and blender. Consider having supervision and share cooking responsibilities until no longer able to participate in those. Accidents with firearms and other hazards in the house should be identified and addressed as well.   ABILITY TO BE LEFT ALONE: If patient is unable to contact 911 operator, consider using LifeLine, or when the need is there, arrange for someone to stay with patients. Smoking is a fire hazard, consider supervision or cessation. Risk of wandering should be assessed by caregiver and if detected at any point, supervision and safe proof recommendations should be instituted.  MEDICATION SUPERVISION: Inability to self-administer medication needs to be constantly addressed. Implement a mechanism to ensure safe administration of the medications.   DRIVING: Regarding driving, in patients with progressive  memory problems, driving will be impaired. We advise to have someone else do the driving if trouble finding directions or if minor accidents are reported. Independent driving assessment is available to determine safety of driving.   If you are interested in the driving assessment, you can contact the following:  [employer] in [place]  [place] [phone] or [phone]      [place] refers to food and lifestyle choices that are based on the traditions of countries located on the [employer]. This way of eating has been shown to help prevent certain conditions and improve outcomes for people who have chronic diseases, like kidney disease and heart disease. What are tips for following this plan? Lifestyle  Cook and eat meals together with your family, when possible. Drink enough fluid to keep your urine clear or pale yellow. Be physically active every day. This includes: Aerobic exercise like running or swimming. Leisure activities like gardening, walking, or housework. Get 7-8 hours of sleep each night. If recommended by your health care provider, drink red wine in moderation. This means 1 glass a day for nonpregnant women and 2 glasses a day for men. A glass of wine equals 5 oz (150 mL). Reading food labels  Check the serving size of packaged foods. For foods such as rice and pasta, the serving size refers to the amount of cooked product, not dry. Check the total fat in packaged foods. Avoid foods that have saturated  fat or trans fats. Check the ingredients list for added sugars, such as corn syrup. Shopping  At the grocery store, buy most of your food from the areas near the walls of the store. This includes: Fresh fruits and vegetables (produce). Grains, beans, nuts, and seeds. Some of these may be available in unpackaged  forms or large amounts (in bulk). Fresh seafood. Poultry and eggs. Low-fat dairy products. Buy whole ingredients instead of prepackaged foods. Buy fresh fruits and vegetables in-season from local farmers markets. Buy frozen fruits and vegetables in resealable bags. If you do not have access to quality fresh seafood, buy precooked frozen shrimp or canned fish, such as tuna, salmon, or sardines. Buy small amounts of raw or cooked vegetables, salads, or olives from the deli or salad bar at your store. Stock your pantry so you always have certain foods on hand, such as olive oil, canned tuna, canned tomatoes, rice, pasta, and beans. Cooking  Cook foods with extra-virgin olive oil instead of using butter or other vegetable oils. Have meat as a side dish, and have vegetables or grains as your main dish. This means having meat in small portions or adding small amounts of meat to foods like pasta or stew. Use beans or vegetables instead of meat in common dishes like chili or lasagna. Experiment with different cooking methods. Try roasting or broiling vegetables instead of steaming or sauteing them. Add frozen vegetables to soups, stews, pasta, or rice. Add nuts or seeds for added healthy fat at each meal. You can add these to yogurt, salads, or vegetable dishes. Marinate fish or vegetables using olive oil, lemon juice, garlic, and fresh herbs. Meal planning  Plan to eat 1 vegetarian meal one day each week. Try to work up to 2 vegetarian meals, if possible. Eat seafood 2 or more times a week. Have healthy snacks readily available, such as: Vegetable sticks with hummus. Greek yogurt. Fruit and nut trail mix. Eat balanced meals throughout the week. This includes: Fruit: 2-3 servings a day Vegetables: 4-5 servings a day Low-fat dairy: 2 servings a day Fish, poultry, or lean meat: 1 serving a day Beans and legumes: 2 or more servings a week Nuts and seeds: 1-2 servings a day Whole grains: 6-8  servings a day Extra-virgin olive oil: 3-4 servings a day Limit red meat and sweets to only a few servings a month What are my food choices? Mediterranean diet Recommended Grains: Whole-grain pasta. Brown rice. Bulgar wheat. Polenta. Couscous. Whole-wheat bread. [place]. Vegetables: Artichokes. Beets. Broccoli. Cabbage. Carrots. Eggplant. Green beans. Chard. Kale. Spinach. Onions. Leeks. Peas. Squash. Tomatoes. Peppers. Radishes. Fruits: Apples. Apricots. Avocado. Berries. Bananas. Cherries. Dates. Figs. Grapes. Lemons. Melon. Oranges. Peaches. Plums. Pomegranate. Meats and other protein foods: Beans. Almonds. Sunflower seeds. Pine nuts. Peanuts. [place]. Salmon. Scallops. Shrimp. [place]. Tilapia. Clams. Oysters. Eggs. Dairy: Low-fat milk. Cheese. Greek yogurt. Beverages: Water. Red wine. Herbal tea. Fats and oils: Extra virgin olive oil. Avocado oil. Grape seed oil. Sweets and desserts: [place] yogurt with honey. Baked apples. Poached pears. Trail mix. Seasoning and other foods: Basil. Cilantro. Coriander. Cumin. Mint. Parsley. Sage. Rosemary. Tarragon. Garlic. Oregano. Thyme. Pepper. Balsalmic vinegar. Tahini. Hummus. Tomato sauce. Olives. Mushrooms. Limit these Grains: Prepackaged pasta or rice dishes. Prepackaged cereal with added sugar. Vegetables: Deep fried potatoes (french fries). Fruits: Fruit canned in syrup. Meats and other protein foods: Beef. Pork. Lamb. Poultry with skin. Hot dogs. Berniece Salines. Dairy: Ice cream. Sour cream. Whole milk. Beverages: Juice. Sugar-sweetened soft drinks. Beer. Liquor and  spirits. Fats and oils: Butter. Canola oil. Vegetable oil. Beef fat (tallow). Lard. Sweets and desserts: Cookies. Cakes. Pies. Candy. Seasoning and other foods: Mayonnaise. Premade sauces and marinades. The items listed may not be a complete list. Talk with your dietitian about what dietary choices are right for you. Summary The Mediterranean diet includes both food and lifestyle  choices. Eat a variety of fresh fruits and vegetables, beans, nuts, seeds, and whole grains. Limit the amount of red meat and sweets that you eat. Talk with your health care provider about whether it is safe for you to drink red wine in moderation. This means 1 glass a day for nonpregnant women and 2 glasses a day for men. A glass of wine equals 5 oz (150 mL). This information is not intended to replace advice given to you by your health care provider. Make sure you discuss any questions you have with your health care provider. Document Released: 02/22/2016 Document Revised: 03/26/2016 Document Reviewed: 02/22/2016 Elsevier Interactive Patient Education  2017 [employer].

## 2022-04-01 ENCOUNTER — Other Ambulatory Visit: Payer: Self-pay | Admitting: Family Medicine

## 2022-04-04 ENCOUNTER — Other Ambulatory Visit: Payer: Self-pay | Admitting: Physician Assistant

## 2022-05-01 ENCOUNTER — Telehealth: Payer: Self-pay | Admitting: Endocrinology

## 2022-05-01 NOTE — Telephone Encounter (Signed)
Patient's daughter, Tammy Hughes, is calling to say that they are concerned about Tammy Hughes's blood sugar levels.  At 10:47 AM yesterday the CGM read 241, at 12:49 PM today the CGM read 183, and at 2:44 today it read 168.  She took 14 units of insulin at 12:00 (her only dose so far today).  Also, Tammy Hughes states that they are confused about the dosage of insulin as well.  She says that the bottle says 14 units in the morning and 10 units at bedtime, but Dr. Dwyane Dee told her to do 12 units in the morning and 12 units at bedtime.  What does she need to do?  Also, Tammy Hughes is not sure if the CGM is connected to the office or not.

## 2022-05-24 ENCOUNTER — Ambulatory Visit (INDEPENDENT_AMBULATORY_CARE_PROVIDER_SITE_OTHER): Payer: Medicare (Managed Care)

## 2022-05-24 VITALS — BP 120/64 | HR 59 | Temp 97.1°F | Wt 123.0 lb

## 2022-05-24 DIAGNOSIS — Z0000 Encounter for general adult medical examination without abnormal findings: Secondary | ICD-10-CM

## 2022-05-24 NOTE — Patient Instructions (Signed)
[name] , Thank you for taking time to come for your Medicare Wellness Visit. I appreciate your ongoing commitment to your health goals. Please review the following plan we discussed and let me know if I can assist you in the future.   These are the goals we discussed:  Goals      Patient Stated     Maintain independence.      Patient Stated     None at this times        This is a list of the screening recommended for you and due dates:  Health Maintenance  Topic Date Due   COVID-19 Vaccine (4 - Moderna risk series) 07/04/2020   Eye exam for diabetics  02/06/2021   Zoster (Shingles) Vaccine (1 of 2) 06/14/2022*   Hemoglobin A1C  08/07/2022   Complete foot exam   02/28/2023   Tetanus Vaccine  11/22/2027   Pneumonia Vaccine  Completed   Flu Shot  Completed   HPV Vaccine  Aged Out   Mammogram  Discontinued   DEXA scan (bone density measurement)  Discontinued  *Topic was postponed. The date shown is not the original due date.    Advanced directives: Please bring a copy of your health care power of attorney and living will to the office at your convenience.   Conditions/risks identified: eat healthier   Next appointment: Follow up in one year for your annual wellness visit    Preventive Care 65 Years and Older, Female Preventive care refers to lifestyle choices and visits with your health care provider that can promote health and wellness. What does preventive care include? A yearly physical exam. This is also called an annual well check. Dental exams once or twice a year. Routine eye exams. Ask your health care provider how often you should have your eyes checked. Personal lifestyle choices, including: Daily care of your teeth and gums. Regular physical activity. Eating a healthy diet. Avoiding tobacco and drug use. Limiting alcohol use. Practicing safe sex. Taking low-dose aspirin every day. Taking vitamin and mineral supplements as recommended by your health  care provider. What happens during an annual well check? The services and screenings done by your health care provider during your annual well check will depend on your age, overall health, lifestyle risk factors, and family history of disease. Counseling  Your health care provider may ask you questions about your: Alcohol use. Tobacco use. Drug use. Emotional well-being. Home and relationship well-being. Sexual activity. Eating habits. History of falls. Memory and ability to understand (cognition). Work and work Statistician. Reproductive health. Screening  You may have the following tests or measurements: Height, weight, and BMI. Blood pressure. Lipid and cholesterol levels. These may be checked every 5 years, or more frequently if you are over 63 years old. Skin check. Lung cancer screening. You may have this screening every year starting at age 65 if you have a 30-pack-year history of smoking and currently smoke or have quit within the past 15 years. Fecal occult blood test (FOBT) of the stool. You may have this test every year starting at age 61. Flexible sigmoidoscopy or colonoscopy. You may have a sigmoidoscopy every 5 years or a colonoscopy every 10 years starting at age 62. Hepatitis C blood test. Hepatitis B blood test. Sexually transmitted disease (STD) testing. Diabetes screening. This is done by checking your blood sugar (glucose) after you have not eaten for a while (fasting). You may have this done every 1-3 years. Bone density scan. This  is done to screen for osteoporosis. You may have this done starting at age 87. Mammogram. This may be done every 1-2 years. Talk to your health care provider about how often you should have regular mammograms. Talk with your health care provider about your test results, treatment options, and if necessary, the need for more tests. Vaccines  Your health care provider may recommend certain vaccines, such as: Influenza vaccine. This is  recommended every year. Tetanus, diphtheria, and acellular pertussis (Tdap, Td) vaccine. You may need a Td booster every 10 years. Zoster vaccine. You may need this after age 71. Pneumococcal 13-valent conjugate (PCV13) vaccine. One dose is recommended after age 63. Pneumococcal polysaccharide (PPSV23) vaccine. One dose is recommended after age 26. Talk to your health care provider about which screenings and vaccines you need and how often you need them. This information is not intended to replace advice given to you by your health care provider. Make sure you discuss any questions you have with your health care provider. Document Released: 07/28/2015 Document Revised: 03/20/2016 Document Reviewed: 05/02/2015 Elsevier Interactive Patient Education  2017 [place] Prevention in the Home Falls can cause injuries. They can happen to people of all ages. There are many things you can do to make your home safe and to help prevent falls. What can I do on the outside of my home? Regularly fix the edges of walkways and driveways and fix any cracks. Remove anything that might make you trip as you walk through a door, such as a raised step or threshold. Trim any bushes or trees on the path to your home. Use bright outdoor lighting. Clear any walking paths of anything that might make someone trip, such as rocks or tools. Regularly check to see if handrails are loose or broken. Make sure that both sides of any steps have handrails. Any raised decks and porches should have guardrails on the edges. Have any leaves, snow, or ice cleared regularly. Use sand or salt on walking paths during winter. Clean up any spills in your garage right away. This includes oil or grease spills. What can I do in the bathroom? Use night lights. Install grab bars by the toilet and in the tub and shower. Do not use towel bars as grab bars. Use non-skid mats or decals in the tub or shower. If you need to sit down in  the shower, use a plastic, non-slip stool. Keep the floor dry. Clean up any water that spills on the floor as soon as it happens. Remove soap buildup in the tub or shower regularly. Attach bath mats securely with double-sided non-slip rug tape. Do not have throw rugs and other things on the floor that can make you trip. What can I do in the bedroom? Use night lights. Make sure that you have a light by your bed that is easy to reach. Do not use any sheets or blankets that are too big for your bed. They should not hang down onto the floor. Have a firm chair that has side arms. You can use this for support while you get dressed. Do not have throw rugs and other things on the floor that can make you trip. What can I do in the kitchen? Clean up any spills right away. Avoid walking on wet floors. Keep items that you use a lot in easy-to-reach places. If you need to reach something above you, use a strong step stool that has a grab bar. Keep electrical cords out  of the way. Do not use floor polish or wax that makes floors slippery. If you must use wax, use non-skid floor wax. Do not have throw rugs and other things on the floor that can make you trip. What can I do with my stairs? Do not leave any items on the stairs. Make sure that there are handrails on both sides of the stairs and use them. Fix handrails that are broken or loose. Make sure that handrails are as long as the stairways. Check any carpeting to make sure that it is firmly attached to the stairs. Fix any carpet that is loose or worn. Avoid having throw rugs at the top or bottom of the stairs. If you do have throw rugs, attach them to the floor with carpet tape. Make sure that you have a light switch at the top of the stairs and the bottom of the stairs. If you do not have them, ask someone to add them for you. What else can I do to help prevent falls? Wear shoes that: Do not have high heels. Have rubber bottoms. Are comfortable  and fit you well. Are closed at the toe. Do not wear sandals. If you use a stepladder: Make sure that it is fully opened. Do not climb a closed stepladder. Make sure that both sides of the stepladder are locked into place. Ask someone to hold it for you, if possible. Clearly mark and make sure that you can see: Any grab bars or handrails. First and last steps. Where the edge of each step is. Use tools that help you move around (mobility aids) if they are needed. These include: Canes. Walkers. Scooters. Crutches. Turn on the lights when you go into a dark area. Replace any light bulbs as soon as they burn out. Set up your furniture so you have a clear path. Avoid moving your furniture around. If any of your floors are uneven, fix them. If there are any pets around you, be aware of where they are. Review your medicines with your doctor. Some medicines can make you feel dizzy. This can increase your chance of falling. Ask your doctor what other things that you can do to help prevent falls. This information is not intended to replace advice given to you by your health care provider. Make sure you discuss any questions you have with your health care provider. Document Released: 04/27/2009 Document Revised: 12/07/2015 Document Reviewed: 08/05/2014 Elsevier Interactive Patient Education  2017 [employer].

## 2022-05-24 NOTE — Progress Notes (Signed)
Subjective:   Tammy Hughes is a 88 y.o. female who presents for Medicare Annual (Subsequent) preventive examination.  Review of Systems     Cardiac Risk Factors include: advanced age (>9mn, >>83women)     Objective:    Today's Vitals   05/24/22 1014  BP: 120/64  Pulse: (!) 59  Temp: (!) 97.1 F (36.2 C)  SpO2: 96%  Weight: 123 lb (55.8 kg)   Body mass index is 24.02 kg/m.     05/24/2022   10:19 AM 03/27/2022    1:09 PM 09/19/2021    1:07 PM 03/21/2021   12:52 PM 09/19/2020   12:00 PM 07/13/2020   11:39 AM 07/03/2020    2:45 PM  Advanced Directives  Does Patient Have a Medical Advance Directive? Yes Yes No No No No No  Type of [place]        Copy of [place] Chart? No - copy requested        Would patient like information on creating a medical advance directive?     Yes (MAU/Ambulatory/Procedural Areas - Information given)  No - Patient declined    Current Medications (verified) Outpatient Encounter Medications as of 05/24/2022  Medication Sig   Acetaminophen (TYLENOL PO) Take 500 mg by mouth 2 (two) times daily.    calcium carbonate (TUMS - DOSED IN MG ELEMENTAL CALCIUM) 500 MG chewable tablet Chew 1 tablet by mouth daily. As needed   clopidogrel (PLAVIX) 75 MG tablet TAKE 1 TABLET BY MOUTH ONCE DAILY   Continuous Blood Gluc Receiver (FREESTYLE LIBRE 2 READER) DEVI Use as instructed to check blood sugars   Continuous Blood Gluc Sensor (FREESTYLE LIBRE 2 SENSOR) MISC CHANGE EVERY 14 DAYS   donepezil (ARICEPT) 10 MG tablet TAKE 1 TABLET BY MOUTH ONCE DAILY   escitalopram (LEXAPRO) 5 MG tablet Take 1 tablet (5 mg total) by mouth daily.   glucose blood (ONETOUCH VERIO) test strip Use as instructed   insulin aspart protamine - aspart (NOVOLOG MIX 70/30 FLEXPEN) (70-30) 100 UNIT/ML FlexPen Inject 12 Units into the skin 2 (two) times daily with a meal. And syringes 2/day   meclizine (ANTIVERT) 25 MG  tablet TAKE 1 TABLET BY MOUTH THREE TIMES DAILY IF NEEDED FOR DIZZINESS   mirtazapine (REMERON) 15 MG tablet TAKE 1 TABLET BY MOUTH AT BEDTIME   pantoprazole (PROTONIX) 40 MG tablet TAKE 1 TABLET BY MOUTH TWICE DAILY   rosuvastatin (CRESTOR) 5 MG tablet TAKE 1 TABLET BY MOUTH ONCE DAILY   silver sulfADIAZINE (SILVADENE) 1 % cream Apply 1 Application topically daily.   [DISCONTINUED] melatonin 3 MG TABS tablet Take 3 mg by mouth at bedtime.   No facility-administered encounter medications on file as of 05/24/2022.    Allergies (verified) Pirfenidone and Aspirin   History: Past Medical History:  Diagnosis Date   Allergy    SEASONAL   ANXIETY 03/08/2008   Cataract    REMOVED BILATERAL   Depression    DIVERTICULOSIS, COLON 03/08/2008   DV[place]    GERD 02/23/2007   HYPERCHOLESTEROLEMIA 02/01/2008   HYPERTENSION 03/08/2008   Insulin dependent diabetes mellitus    OSTEOARTHRITIS 02/23/2007   OSTEOPOROSIS 02/23/2007   PERIPHERAL NEUROPATHY 02/23/2007   STD (sexually transmitted disease)    Stomach cancer ([facility] 07/20/14   invasive adenocarcinoma w/signet rings features   Urinary incontinence    Urolithiasis    Past Surgical History:  Procedure Laterality Date  ABDOMINAL SURGERY     CARDIAC CATHETERIZATION N/A 02/02/2016   Procedure: Left Heart Cath and Coronary Angiography;  Surgeon: Peter M Martinique, MD;  Location: [facility];  Service: Cardiovascular;  Laterality: N/A;   CHOLECYSTECTOMY  1995   ESOPHAGOGASTRODUODENOSCOPY  06/08/2002   GASTRECTOMY N/A 07/20/2014   Procedure: PROXIMAL GASTRECTOMY;  Surgeon: Stark Klein, MD;  Location: [place];  Service: General;  Laterality: N/A;   GASTROJEJUNOSTOMY N/A 07/20/2014   Procedure: GASTROJEJUNOSTOMY;  Surgeon: Stark Klein, MD;  Location: [place];  Service: General;  Laterality: N/A;   LAPAROSCOPY N/A 07/20/2014   Procedure: LAPAROSCOPY DIAGNOSTIC;  Surgeon: Stark Klein, MD;  Location: [place];  Service: General;   Laterality: N/A;   Family History  Problem Relation Age of Onset   Cancer Father        Prostate Cancer   Cancer Sister        Breast Cancer   Diabetes Sister    Diabetes Sister    Other Mother        pneumonia    Cancer Sister        breast cancer    Social History   Socioeconomic History   Marital status: Married    Spouse name: Not on file   Number of children: Not on file   Years of education: Not on file   Highest education level: Not on file  Occupational History   Occupation: Retired    Fish farm manager: RETIRED  Tobacco Use   Smoking status: Never   Smokeless tobacco: Never  Vaping Use   Vaping Use: Never used  Substance and Sexual Activity   Alcohol use: No    Alcohol/week: 0.0 standard drinks of alcohol   Drug use: No   Sexual activity: Not Currently    Birth control/protection: Post-menopausal  Other Topics Concern   Not on file  Social History Narrative   Retired-Domestic Assistant   Right handed    Lives with husband    Lives in a one story home   Social Determinants of Health   Financial Resource Strain: Low Risk  (05/24/2022)   Overall Financial Resource Strain (CARDIA)    Difficulty of Paying Living Expenses: Not hard at all  Food Insecurity: No Food Insecurity (05/24/2022)   Hunger Vital Sign    Worried About Running Out of Food in the Last Year: Never true    [place] in the Last Year: Never true  Transportation Needs: No Transportation Needs (05/24/2022)   PRAPARE - Hydrologist (Medical): No    Lack of Transportation (Non-Medical): No  Physical Activity: Insufficiently Active (05/24/2022)   Exercise Vital Sign    Days of Exercise per Week: 2 days    Minutes of Exercise per Session: 30 min  Stress: No Stress Concern Present (05/24/2022)   [place]    Feeling of Stress : Not at all  Social Connections: Moderately Integrated (05/24/2022)    Social Connection and Isolation Panel [NHANES]    Frequency of Communication with Friends and Family: More than three times a week    Frequency of Social Gatherings with Friends and Family: More than three times a week    Attends Religious Services: More than 4 times per year    Active Member of [employer] or Organizations: No    Attends Archivist Meetings: Never    Marital Status: Married    Tobacco Counseling Counseling given: Not  Answered   Clinical Intake:  Pre-visit preparation completed: Yes  Pain : No/denies pain     BMI - recorded: 24.02 Nutritional Status: BMI of 19-24  Normal Nutritional Risks: None Diabetes: Yes CBG done?: No Did pt. bring in CBG monitor from home?: No  How often do you need to have someone help you when you read instructions, pamphlets, or other written materials from your doctor or pharmacy?: 1 - Never  Diabetic?Nutrition Risk Assessment:  Has the patient had any N/V/D within the last 2 months?  No  Does the patient have any non-healing wounds?  No  Has the patient had any unintentional weight loss or weight gain?  No   Diabetes:  Is the patient diabetic?  Yes  If diabetic, was a CBG obtained today?  No  Did the patient bring in their glucometer from home?  Yes  How often do you monitor your CBG's? Daily .   Financial Strains and Diabetes Management:  Are you having any financial strains with the device, your supplies or your medication? No .  Does the patient want to be seen by Chronic Care Management for management of their diabetes?  No  Would the patient like to be referred to a Nutritionist or for Diabetic Management?  No   Diabetic Exams:  Diabetic Eye Exam: Overdue for diabetic eye exam. Pt has been advised about the importance in completing this exam. Patient advised to call and schedule an eye exam. Diabetic Foot Exam: Completed 02/27/22   Interpreter Needed?: No  Information entered by :: Charlott Rakes,  LPN   Activities of Daily Living    05/24/2022   10:20 AM  In your present state of health, do you have any difficulty performing the following activities:  Hearing? 0  Vision? 0  Difficulty concentrating or making decisions? 0  Walking or climbing stairs? 0  Dressing or bathing? 0  Doing errands, shopping? 0  Preparing Food and eating ? N  Using the Toilet? N  In the past six months, have you accidently leaked urine? N  Do you have problems with loss of bowel control? N  Managing your Medications? N  Managing your Finances? N  Housekeeping or managing your Housekeeping? N    Patient Care Team: Vivi Barrack, MD as PCP - General (Family Medicine) Elouise Munroe, MD as PCP - Cardiology (Cardiology) Deneise Lever, MD as Attending Physician (Pulmonary Disease) Inda Castle, MD (Inactive) as Attending Physician (Gastroenterology) Elsie Stain, MD as Consulting Physician (Pulmonary Disease) Ladell Pier, MD as Consulting Physician (Oncology) Renato Shin, MD (Inactive) as Consulting Physician (Endocrinology) Myra Gianotti  as Consulting Physician (Optometry) Delice Lesch Lezlie Octave, MD as Consulting Physician (Neurology) Royston Sinner Colin Benton, MD as Consulting Physician (Obstetrics and Gynecology) Elease Hashimoto (Neurology)  Indicate any recent Medical Services you may have received from other than [facility] providers in the past year (date may be approximate).     Assessment:   This is a routine wellness examination for Yulissa.  Hearing/Vision screen Hearing Screening - Comments:: Pt denies any hearing issues  Vision Screening - Comments:: Pt follows up with [facility] for annual eye exams   Dietary issues and exercise activities discussed:     Goals Addressed             This Visit's Progress    Patient Stated       Eat healthier        Depression Screen    05/24/2022  10:20 AM 02/04/2022   12:58 PM 02/05/2021    1:18 PM 07/03/2020     2:44 PM 03/22/2020    1:17 PM 01/27/2020   11:23 AM 06/17/2019   12:40 PM  PHQ 2/9 Scores  PHQ - 2 Score 0 0 0 0 0 0 0    Fall Risk    05/24/2022   10:20 AM 03/27/2022    1:09 PM 03/15/2022   11:52 AM 02/04/2022   12:58 PM 09/19/2021    1:06 PM  Fall Risk   Falls in the past year? 0 0 0 0 0  Number falls in past yr: 0 0 0 0 0  Injury with Fall? 0 0 0 0 0  Risk for fall due to : Impaired vision  No Fall Risks No Fall Risks   Follow up Falls prevention discussed  Falls evaluation completed      FALL RISK PREVENTION PERTAINING TO THE HOME:  Any stairs in or around the home? Yes  If so, are there any without handrails? No  Home free of loose throw rugs in walkways, pet beds, electrical cords, etc? Yes  Adequate lighting in your home to reduce risk of falls? Yes   ASSISTIVE DEVICES UTILIZED TO PREVENT FALLS:  Life alert? Yes  Use of a cane, walker or w/c? No  Grab bars in the bathroom? Yes  Shower chair or bench in shower? No  Elevated toilet seat or a handicapped toilet? No   TIMED UP AND GO:  Was the test performed? Yes .  Length of time to ambulate 10 feet: 10 sec.   Gait steady and fast without use of assistive device  Cognitive Function:    09/20/2021    8:00 AM 03/21/2021    1:00 PM 07/13/2020   11:00 AM 09/20/2019   11:23 AM  MMSE - Mini Mental State Exam  Orientation to time '5 4 4 5  '$ Orientation to Place '5 5 5 4  '$ Registration '3 3 3 3  '$ Attention/ Calculation 4 1 0 3  Recall '2 3 2 2  '$ Language- name 2 objects '2 2 2 2  '$ Language- repeat 1 1 0 0  Language- follow 3 step command '3 3 3 2  '$ Language- read & follow direction '1 1 1 1  '$ Write a sentence '1 1 1 1  '$ Copy design 0 0 0 1  Total score '27 24 21 24        '$ 05/24/2022   10:30 AM 07/03/2020    2:47 PM 06/17/2019   12:39 PM  6CIT Screen  What Year? 0 points 0 points 0 points  What month? 0 points 0 points 0 points  What time? 0 points  0 points  Count back from 20 2 points 2 points 0 points  Months in reverse  4 points 4 points 0 points  Repeat phrase 10 points 6 points 2 points  Total Score 16 points  2 points    Immunizations Immunization History  Administered Date(s) Administered   Fluad Quad(high Dose 65+) 03/19/2019, 04/18/2020, 06/04/2021   Influenza Split 07/15/2009, 07/15/2010, 05/16/2011, 04/16/2012, 05/15/2013   Influenza Whole 04/11/2008, 03/15/2010   Influenza, High Dose Seasonal PF 05/27/2017, 04/24/2018   Influenza,inj,Quad PF,6+ Mos 04/06/2014, 03/28/2015, 03/15/2016   Influenza-Unspecified 04/13/2013   Moderna Sars-Covid-2 Vaccination 09/09/2019, 10/07/2019, 05/09/2020   Pneumococcal Conjugate-13 11/12/2013   Pneumococcal Polysaccharide-23 07/15/1993, 07/15/2004, 07/23/2011   Td 01/12/1998, 07/17/2007   Tdap 11/21/2017   Zoster, Live 06/25/2016    TDAP status: Up to  date  Flu Vaccine status: Up to date  Pneumococcal vaccine status: Up to date  Covid-19 vaccine status: Completed vaccines  Qualifies for Shingles Vaccine? Yes   Zostavax completed No   Shingrix Completed?: No.    Education has been provided regarding the importance of this vaccine. Patient has been advised to call insurance company to determine out of pocket expense if they have not yet received this vaccine. Advised may also receive vaccine at local pharmacy or Health Dept. Verbalized acceptance and understanding.  Screening Tests Health Maintenance  Topic Date Due   COVID-19 Vaccine (4 - Moderna risk series) 07/04/2020   OPHTHALMOLOGY EXAM  02/06/2021   Zoster Vaccines- Shingrix (1 of 2) 06/14/2022 (Originally 04/12/1952)   HEMOGLOBIN A1C  08/07/2022   FOOT EXAM  02/28/2023   TETANUS/TDAP  11/22/2027   Pneumonia Vaccine 26+ Years old  Completed   INFLUENZA VACCINE  Completed   HPV VACCINES  Aged Out   MAMMOGRAM  Discontinued   DEXA SCAN  Discontinued    Health Maintenance  Health Maintenance Due  Topic Date Due   COVID-19 Vaccine (4 - Moderna risk series) 07/04/2020   OPHTHALMOLOGY EXAM   02/06/2021    Colorectal cancer screening: No longer required.   Mammogram status: No longer required due to age .     Additional Screening:   Vision Screening: Recommended annual ophthalmology exams for early detection of glaucoma and other disorders of the eye. Is the patient up to date with their annual eye exam?  Yes  Who is the provider or what is the name of the office in which the patient attends annual eye exams? [facility]  If pt is not established with a provider, would they like to be referred to a provider to establish care? No .   Dental Screening: Recommended annual dental exams for proper oral hygiene  Community Resource Referral / Chronic Care Management: CRR required this visit?  No   CCM required this visit?  No      Plan:     I have personally reviewed and noted the following in the patient's chart:   Medical and social history Use of alcohol, tobacco or illicit drugs  Current medications and supplements including opioid prescriptions. Patient is not currently taking opioid prescriptions. Functional ability and status Nutritional status Physical activity Advanced directives List of other physicians Hospitalizations, surgeries, and ER visits in previous 12 months Vitals Screenings to include cognitive, depression, and falls Referrals and appointments  In addition, I have reviewed and discussed with patient certain preventive protocols, quality metrics, and best practice recommendations. A written personalized care plan for preventive services as well as general preventive health recommendations were provided to patient.     Willette Brace, LPN   [id number]   Nurse Notes:  Pt scored a 16 on 6CIT  alert it was just confusing for the moths and she didn't remember any of the address given

## 2022-05-27 ENCOUNTER — Encounter: Payer: Self-pay | Admitting: Nurse Practitioner

## 2022-05-27 ENCOUNTER — Ambulatory Visit (INDEPENDENT_AMBULATORY_CARE_PROVIDER_SITE_OTHER): Payer: Medicare (Managed Care) | Admitting: Nurse Practitioner

## 2022-05-27 VITALS — BP 124/72 | HR 52 | Ht 60.0 in | Wt 121.8 lb

## 2022-05-27 DIAGNOSIS — K219 Gastro-esophageal reflux disease without esophagitis: Secondary | ICD-10-CM

## 2022-05-27 DIAGNOSIS — J84112 Idiopathic pulmonary fibrosis: Secondary | ICD-10-CM | POA: Diagnosis not present

## 2022-05-27 MED ORDER — PANTOPRAZOLE SODIUM 40 MG PO TBEC: 40.0000 mg | DELAYED_RELEASE_TABLET | Freq: Every day | ORAL | 3 refills | Status: DC

## 2022-05-27 NOTE — Progress Notes (Unsigned)
$'@Patient'D$  ID: Tammy Hughes, female    DOB: March 27, 1933, 88 y.o.   MRN: [id number]  Chief Complaint  Patient presents with   Follow-up    Pt f/u she states she is doing fine and isn't having any breathing difficulty. No other questions or concerns    Referring provider: Vivi Barrack, MD  HPI: 88 year old female, never smoker followed for idiopathic pulmonary fibrosis. Previously treated with Esbriet but did not tolerate it. She is a patient of Dr. Bari Mantis and last seen in office on 01/21/2022. Past medical history significant for HTN, CAD, hx of DVT, TIA, non-allergic rhinitis, GERD, NASH, gastric cancer Stage IIA s/p gastrectomy 2016, HLD, DM II, mild dementia, osteoarthritis, back pain, insomnia.   TEST/EVENTS:  09/2013 PFTs: DLCO 72%, TLC 93%, FEV1 80% 10/2014 CTA chest: ILD, favored fibrotic NSIP, stable from 2015 11/2020 HRCT: widespread but patchy areas of ground-glass attenuation, septal thickening, subpleural reticulation, parenchymal banding, traction bronchiectasis and honeycombing. Findings have a mild craniocaudal gradient and are clearly progressive compared to prior study from 2016 12/2020 PFTs: FVC 88%, DLCO 12.57 (75%) 12/21/2021 HRCT chest: atherosclerosis. Moderate pulmonary fibrosis with apical to basal gradient. Honeycombing at bases. UIP pattern. Not significantly changed when compared to 2022 but progressed when compared to 2016.   01/21/2022: OV with Dr. Elsworth Soho. CT stable from 2022, worse compared to 2016. Unable to tolerate Esbriet in the past. Discussion surrounding antifibrotic treatment with Ofev; pt declined. Walking oximetry without desaturation. F/u 6 months.   05/27/2022: Today - follow up Patient presents today with her daughter for six month follow up. Breathing is stable. She doesn't really have much shortness of breath at baseline. No increased cough or chest congestion. Main symptom is fatigue. Lives a very sedentary lifestyle. Her daughter tries to encourage her  to be more active. She is not on any antifibrotic therapy. Previously unable to tolerate Esbriet and declined to try Ofev d/t side effect profile. Previously started on protonix d/t burning chest discomfort; this has significantly helped. Would like to continue this.   Allergies  Allergen Reactions   Pirfenidone Diarrhea and Nausea And Vomiting   Aspirin     rash    Immunization History  Administered Date(s) Administered   Fluad Quad(high Dose 65+) 03/19/2019, 04/18/2020, 06/04/2021   Influenza Split 07/15/2009, 07/15/2010, 05/16/2011, 04/16/2012, 05/15/2013   Influenza Whole 04/11/2008, 03/15/2010   Influenza, High Dose Seasonal PF 05/27/2017, 04/24/2018   Influenza,inj,Quad PF,6+ Mos 04/06/2014, 03/28/2015, 03/15/2016   Influenza-Unspecified 04/13/2013   Moderna Sars-Covid-2 Vaccination 09/09/2019, 10/07/2019, 05/09/2020   Pneumococcal Conjugate-13 11/12/2013   Pneumococcal Polysaccharide-23 07/15/1993, 07/15/2004, 07/23/2011   Td 01/12/1998, 07/17/2007   Tdap 11/21/2017   Zoster, Live 06/25/2016    Past Medical History:  Diagnosis Date   Allergy    SEASONAL   ANXIETY 03/08/2008   Cataract    REMOVED BILATERAL   Depression    DIVERTICULOSIS, COLON 03/08/2008   DVT (deep venous thrombosis) ([facility])    GERD 02/23/2007   HYPERCHOLESTEROLEMIA 02/01/2008   HYPERTENSION 03/08/2008   Insulin dependent diabetes mellitus    OSTEOARTHRITIS 02/23/2007   OSTEOPOROSIS 02/23/2007   PERIPHERAL NEUROPATHY 02/23/2007   STD (sexually transmitted disease)    Stomach cancer ([facility]) 07/20/14   invasive adenocarcinoma w/signet rings features   Urinary incontinence    Urolithiasis     Tobacco History: Social History   Tobacco Use  Smoking Status Never  Smokeless Tobacco Never   Counseling given: Not Answered   Outpatient Medications Prior to Visit  Medication Sig Dispense Refill   Acetaminophen (TYLENOL PO) Take 500 mg by mouth 2 (two) times daily.      calcium carbonate (TUMS - DOSED IN MG  ELEMENTAL CALCIUM) 500 MG chewable tablet Chew 1 tablet by mouth daily. As needed     clopidogrel (PLAVIX) 75 MG tablet TAKE 1 TABLET BY MOUTH ONCE DAILY 90 tablet 0   Continuous Blood Gluc Receiver (FREESTYLE LIBRE 2 READER) DEVI Use as instructed to check blood sugars 1 each 0   Continuous Blood Gluc Sensor (FREESTYLE LIBRE 2 SENSOR) MISC CHANGE EVERY 14 DAYS 6 each 6   donepezil (ARICEPT) 10 MG tablet TAKE 1 TABLET BY MOUTH ONCE DAILY 90 tablet 0   escitalopram (LEXAPRO) 5 MG tablet Take 1 tablet (5 mg total) by mouth daily. 30 tablet 3   glucose blood (ONETOUCH VERIO) test strip Use as instructed 200 strip 0   insulin aspart protamine - aspart (NOVOLOG MIX 70/30 FLEXPEN) (70-30) 100 UNIT/ML FlexPen Inject 12 Units into the skin 2 (two) times daily with a meal. And syringes 2/day 30 mL 3   meclizine (ANTIVERT) 25 MG tablet TAKE 1 TABLET BY MOUTH THREE TIMES DAILY IF NEEDED FOR DIZZINESS 30 tablet 0   mirtazapine (REMERON) 15 MG tablet TAKE 1 TABLET BY MOUTH AT BEDTIME 90 tablet 0   rosuvastatin (CRESTOR) 5 MG tablet TAKE 1 TABLET BY MOUTH ONCE DAILY 90 tablet 0   silver sulfADIAZINE (SILVADENE) 1 % cream Apply 1 Application topically daily. 50 g 0   pantoprazole (PROTONIX) 40 MG tablet TAKE 1 TABLET BY MOUTH TWICE DAILY 60 tablet 0   No facility-administered medications prior to visit.     Review of Systems:   Constitutional: No weight loss or gain, night sweats, fevers, chills, or lassitude. +fatigue (baseline) HEENT: No headaches, difficulty swallowing, tooth/dental problems, or sore throat. No sneezing, itching, ear ache, nasal congestion, or post nasal drip CV:  No chest pain, orthopnea, PND, swelling in lower extremities, anasarca, dizziness, palpitations, syncope Resp: +cough (unchanged from baseline; occasional). No shortness of breath with exertion or at rest. No excess mucus or change in color of mucus. No hemoptysis. No wheezing.  No chest wall deformity GI:  No indigestion,  heartburn, abdominal pain, nausea, vomiting, diarrhea, change in bowel habits, loss of appetite, bloody stools.  GU: No dysuria, change in color of urine, urgency or frequency.  No flank pain, no hematuria  Skin: No rash, lesions, ulcerations MSK:  No joint pain or swelling.  No decreased range of motion.  No back pain. Neuro: No dizziness or lightheadedness.  Psych: No depression or anxiety. Mood stable.     Physical Exam:  BP 124/72   Pulse (!) 52   Ht 5' (1.524 m)   Wt 121 lb 12.8 oz (55.2 kg)   SpO2 96%   BMI 23.79 kg/m   GEN: Pleasant, interactive, well-nourished; in no acute distress. HEENT:  Normocephalic and atraumatic. PERRLA. Sclera white. Nasal turbinates pink, moist and patent bilaterally. No rhinorrhea present. Oropharynx pink and moist, without exudate or edema. No lesions, ulcerations, or postnasal drip.  NECK:  Supple w/ fair ROM. No JVD present. Normal carotid impulses w/o bruits. Thyroid symmetrical with no goiter or nodules palpated. No lymphadenopathy.   CV: RRR, no m/r/g, no peripheral edema. Pulses intact, +2 bilaterally. No cyanosis, pallor or clubbing. PULMONARY:  Unlabored, regular breathing. Bibasilar crackles otherwise clear bilaterally A&P w/o wheezes/rhonchi. No accessory muscle use. No dullness to percussion. GI: BS present and normoactive. Soft,  non-tender to palpation. No organomegaly or masses detected.  MSK: No erythema, warmth or tenderness. Cap refil <2 sec all extrem. No deformities or joint swelling noted.  Neuro: A/Ox3. No focal deficits noted.   Skin: Warm, no lesions or rashe Psych: Normal affect and behavior. Judgement and thought content appropriate.     Lab Results:  CBC    Component Value Date/Time   WBC 5.3 09/07/2020 1207   RBC 4.00 09/07/2020 1207   HGB 12.0 09/07/2020 1207   HGB 11.0 (L) 11/07/2014 1001   HCT 36.2 09/07/2020 1207   HCT 34.3 (L) 11/07/2014 1001   PLT 271.0 09/07/2020 1207   PLT 242 11/07/2014 1001   MCV 90.5  09/07/2020 1207   MCV 89.6 11/07/2014 1001   MCH 29.8 06/21/2020 1402   MCHC 33.2 09/07/2020 1207   RDW 13.3 09/07/2020 1207   RDW 21.5 (H) 11/07/2014 1001   LYMPHSABS 2.0 09/07/2020 1207   LYMPHSABS 0.4 (L) 11/07/2014 1001   MONOABS 0.5 09/07/2020 1207   MONOABS 0.8 11/07/2014 1001   EOSABS 0.2 09/07/2020 1207   EOSABS 0.2 11/07/2014 1001   BASOSABS 0.0 09/07/2020 1207   BASOSABS 0.0 11/07/2014 1001    BMET    Component Value Date/Time   NA 141 02/25/2022 0950   NA 139 11/07/2014 1001   K 3.9 02/25/2022 0950   K 4.6 11/07/2014 1001   CL 106 02/25/2022 0950   CO2 27 02/25/2022 0950   CO2 26 11/07/2014 1001   GLUCOSE 113 (H) 02/25/2022 0950   GLUCOSE 189 (H) 11/07/2014 1001   BUN 21 02/25/2022 0950   BUN 15.8 11/07/2014 1001   CREATININE 1.17 02/25/2022 0950   CREATININE 0.90 (H) 06/21/2020 1402   CREATININE 0.8 11/07/2014 1001   CALCIUM 10.3 02/25/2022 0950   CALCIUM 9.8 11/07/2014 1001   GFRNONAA 58 (L) 03/22/2020 1359   GFRAA 67 03/22/2020 1359    BNP No results found for: "BNP"   Imaging:  No results found.       Latest Ref Rng & Units 12/19/2020   11:58 AM 09/30/2013   11:41 AM  PFT Results  FVC-Pre L 1.33  1.60   FVC-Predicted Pre % 88  91   FVC-Post L 1.40  1.42   FVC-Predicted Post % 92  80   Pre FEV1/FVC % % 76  85   Post FEV1/FCV % % 76  82   FEV1-Pre L 1.02  1.36   FEV1-Predicted Pre % 87  101   FEV1-Post L 1.07  1.16   DLCO uncorrected ml/min/mmHg 12.57  14.73   DLCO UNC% % 75  72   DLCO corrected ml/min/mmHg 12.57    DLCO COR %Predicted % 75    DLVA Predicted % 95  133   TLC L  4.31   TLC % Predicted %  93   RV % Predicted %  124     No results found for: "NITRICOXIDE"     Assessment & Plan:   IPF (idiopathic pulmonary fibrosis) (HCC) IPF with progressive phenotype. She was intolerant of Esbriet. Declined to start Ofev. CT from June 2023 was stable when compared to year prior. Currently with minimal symptom burden aside from  fatigue, which I suspect is multifactorial. Encouraged her to try to increase her activity level, as tolerated. She would prefer to not repeat PFTs unless she has a drastic decline, which I agree with given her age and history. Walking oximetry today without desaturations.  Patient Instructions  Continue protonix  40 mg daily for reflux   Try to slowly increase your activity level to goal of 30 minutes of exercise a day   Follow up in 6 months with Dr. Elsworth Soho. If symptoms worsen, please contact office for sooner follow up or seek emergency care.    GERD She previously struggled with indigestion/burning discomfort in chest. Cardiac workup unrevealing. I started her on Protonix; she has received good benefit from use. Would like to continue this. Discussed risk/benefit of chronic use of PPI. Verbalized understanding. Refilled today.     Clayton Bibles, NP 05/29/2022  Pt aware and understands NP's role.

## 2022-05-27 NOTE — Patient Instructions (Signed)
Continue protonix 40 mg daily for reflux   Try to slowly increase your activity level to goal of 30 minutes of exercise a day   Follow up in 6 months with Dr. Elsworth Soho. If symptoms worsen, please contact office for sooner follow up or seek emergency care.

## 2022-05-29 ENCOUNTER — Encounter: Payer: Self-pay | Admitting: Nurse Practitioner

## 2022-05-29 NOTE — Assessment & Plan Note (Signed)
IPF with progressive phenotype. She was intolerant of Esbriet. Declined to start Ofev. CT from June 2023 was stable when compared to year prior. Currently with minimal symptom burden aside from fatigue, which I suspect is multifactorial. Encouraged her to try to increase her activity level, as tolerated. She would prefer to not repeat PFTs unless she has a drastic decline, which I agree with given her age and history. Walking oximetry today without desaturations.  Patient Instructions  Continue protonix 40 mg daily for reflux   Try to slowly increase your activity level to goal of 30 minutes of exercise a day   Follow up in 6 months with Dr. Elsworth Soho. If symptoms worsen, please contact office for sooner follow up or seek emergency care.

## 2022-05-29 NOTE — Assessment & Plan Note (Signed)
She previously struggled with indigestion/burning discomfort in chest. Cardiac workup unrevealing. I started her on Protonix; she has received good benefit from use. Would like to continue this. Discussed risk/benefit of chronic use of PPI. Verbalized understanding. Refilled today.

## 2022-05-30 ENCOUNTER — Telehealth: Payer: Self-pay | Admitting: Endocrinology

## 2022-05-30 NOTE — Telephone Encounter (Signed)
Patient's daughter, Ancil Linsey, called to say that patient's blood sugar levels are all over the place.  At 4:00 PM it was 306.  At 6:00 PM it was 181.  Patient takes 12 units around 10:00 AM and again 12 units around 6:00 PM of  NovoLOG Mix 70/30 FlexPen . Rip Harbour does not live with her mother so times are not exact.  Rip Harbour is insisting that her mother be seen.  I explained Dr. Dwyane Dee is out of office, but that someone would be contacting her.

## 2022-05-31 ENCOUNTER — Encounter: Payer: Self-pay | Admitting: Internal Medicine

## 2022-05-31 ENCOUNTER — Ambulatory Visit (INDEPENDENT_AMBULATORY_CARE_PROVIDER_SITE_OTHER): Payer: Medicare (Managed Care) | Admitting: Internal Medicine

## 2022-05-31 VITALS — BP 120/72 | HR 57 | Ht 60.0 in | Wt 122.0 lb

## 2022-05-31 DIAGNOSIS — E119 Type 2 diabetes mellitus without complications: Secondary | ICD-10-CM | POA: Diagnosis not present

## 2022-05-31 DIAGNOSIS — Z794 Long term (current) use of insulin: Secondary | ICD-10-CM

## 2022-05-31 LAB — POCT GLYCOSYLATED HEMOGLOBIN (HGB A1C): Hemoglobin A1C: 7.3 % — AB (ref 4.0–5.6)

## 2022-05-31 MED ORDER — NOVOLOG MIX 70/30 FLEXPEN (70-30) 100 UNIT/ML ~~LOC~~ SUPN: PEN_INJECTOR | SUBCUTANEOUS | 3 refills | Status: DC

## 2022-05-31 MED ORDER — INSULIN PEN NEEDLE 32G X 4 MM MISC: 1.0000 | Freq: Two times a day (BID) | 3 refills | Status: DC

## 2022-05-31 NOTE — Telephone Encounter (Signed)
Patient is scheduled for today, 05/31/2022, at 3:00 PM.

## 2022-05-31 NOTE — Progress Notes (Signed)
Name: Tammy Hughes  Age/ Sex: 88 y.o., female   MRN/ DOB: [id number], [birth year]     PCP: Vivi Barrack, MD   Reason for Endocrinology Evaluation: Type 2 Diabetes Mellitus  Initial Endocrine Consultative Visit: 07/28/2012    PATIENT IDENTIFIER: Tammy Hughes is a 88 y.o. female with a past medical history of HTN, DM, CAD, dyslipidemia . The patient has followed with Endocrinology clinic since 07/28/2012 for consultative assistance with management of her diabetes.  DIABETIC HISTORY:  Tammy Hughes was diagnosed with DM years ago, she has been on metformin in the past and insulin started in 2005. Her hemoglobin A1c has ranged from 6.9% in 2020, peaking at 8.1% in 2023.   SUBJECTIVE:     Today (05/31/2022): Tammy Hughes is here for a follow up on hyperglycemia .She is accompanied by her daughter Rip Harbour . She checks her blood sugars multiple  times daily. The patient has  had hypoglycemic episodes since the last clinic visit, which typically occur at night .  Denies nausea or vomiting but has occasional diarrhea   HOME DIABETES REGIMEN:  Novolog Mix 12 units BID    CONTINUOUS GLUCOSE MONITORING RECORD INTERPRETATION    Dates of Recording: 11/4-11/17/2023  Sensor description: freestyle libre   Results statistics:   CGM use % of time 86  Average and SD 158/37.4  Time in range    72    %  % Time Above 180 18  % Time above 250 10  % Time Below target 0   Glycemic patterns summary: Optimal BG's at night , hyperglycemia noted during the day   Hyperglycemic episodes  postprandial   Hypoglycemic episodes occurred yes  Overnight periods: trends down     HISTORY:  Past Medical History:  Past Medical History:  Diagnosis Date   Allergy    SEASONAL   ANXIETY 03/08/2008   Cataract    REMOVED BILATERAL   Depression    DIVERTICULOSIS, COLON 03/08/2008   DVT (deep venous thrombosis) ([facility])    GERD 02/23/2007   HYPERCHOLESTEROLEMIA 02/01/2008   HYPERTENSION 03/08/2008    Insulin dependent diabetes mellitus    OSTEOARTHRITIS 02/23/2007   OSTEOPOROSIS 02/23/2007   PERIPHERAL NEUROPATHY 02/23/2007   STD (sexually transmitted disease)    Stomach cancer ([facility]) 07/20/14   invasive adenocarcinoma w/signet rings features   Urinary incontinence    Urolithiasis    Past Surgical History:  Past Surgical History:  Procedure Laterality Date   ABDOMINAL SURGERY     CARDIAC CATHETERIZATION N/A 02/02/2016   Procedure: Left Heart Cath and Coronary Angiography;  Surgeon: Peter M Martinique, MD;  Location: [facility];  Service: Cardiovascular;  Laterality: N/A;   CHOLECYSTECTOMY  1995   ESOPHAGOGASTRODUODENOSCOPY  06/08/2002   GASTRECTOMY N/A 07/20/2014   Procedure: PROXIMAL GASTRECTOMY;  Surgeon: Stark Klein, MD;  Location: [facility];  Service: General;  Laterality: N/A;   GASTROJEJUNOSTOMY N/A 07/20/2014   Procedure: GASTROJEJUNOSTOMY;  Surgeon: Stark Klein, MD;  Location: [facility];  Service: General;  Laterality: N/A;   LAPAROSCOPY N/A 07/20/2014   Procedure: LAPAROSCOPY DIAGNOSTIC;  Surgeon: Stark Klein, MD;  Location: [facility];  Service: General;  Laterality: N/A;   Social History:  reports that she has never smoked. She has never used smokeless tobacco. She reports that she does not drink alcohol and does not use drugs. Family History:  Family History  Problem Relation Age of Onset   Cancer Father        Prostate Cancer  Cancer Sister        Breast Cancer   Diabetes Sister    Diabetes Sister    Other Mother        pneumonia    Cancer Sister        breast cancer      HOME MEDICATIONS: Allergies as of 05/31/2022       Reactions   Pirfenidone Diarrhea, Nausea And Vomiting   Aspirin    rash        Medication List        Accurate as of May 31, 2022  1:08 PM. If you have any questions, ask your nurse or doctor.          calcium carbonate 500 MG chewable tablet Commonly known as: TUMS - dosed in mg elemental calcium Chew 1 tablet by mouth daily. As  needed   clopidogrel 75 MG tablet Commonly known as: PLAVIX TAKE 1 TABLET BY MOUTH ONCE DAILY   donepezil 10 MG tablet Commonly known as: ARICEPT TAKE 1 TABLET BY MOUTH ONCE DAILY   escitalopram 5 MG tablet Commonly known as: Lexapro Take 1 tablet (5 mg total) by mouth daily.   FreeStyle Libre 2 Reader [employer] Use as instructed to check blood sugars   FreeStyle Libre 2 Sensor Misc CHANGE EVERY 14 DAYS   meclizine 25 MG tablet Commonly known as: ANTIVERT TAKE 1 TABLET BY MOUTH THREE TIMES DAILY IF NEEDED FOR DIZZINESS   mirtazapine 15 MG tablet Commonly known as: REMERON TAKE 1 TABLET BY MOUTH AT BEDTIME   NovoLOG Mix 70/30 FlexPen (70-30) 100 UNIT/ML FlexPen Generic drug: insulin aspart protamine - aspart Inject 12 Units into the skin 2 (two) times daily with a meal. And syringes 2/day   OneTouch Verio test strip Generic drug: glucose blood Use as instructed   pantoprazole 40 MG tablet Commonly known as: PROTONIX Take 1 tablet (40 mg total) by mouth daily.   rosuvastatin 5 MG tablet Commonly known as: CRESTOR TAKE 1 TABLET BY MOUTH ONCE DAILY   silver sulfADIAZINE 1 % cream Commonly known as: SILVADENE Apply 1 Application topically daily.   TYLENOL PO Take 500 mg by mouth 2 (two) times daily.         OBJECTIVE:   Vital Signs: BP 120/72 (BP Location: Left Arm, Patient Position: Sitting, Cuff Size: Small)   Pulse (!) 57   Ht 5' (1.524 m)   Wt 122 lb (55.3 kg)   SpO2 98%   BMI 23.83 kg/m   Wt Readings from Last 3 Encounters:  05/31/22 122 lb (55.3 kg)  05/27/22 121 lb 12.8 oz (55.2 kg)  05/24/22 123 lb (55.8 kg)     Exam: General: Pt appears well and is in NAD  Neck: General: Supple without adenopathy. Thyroid: Thyroid size normal.  No goiter or nodules appreciated.   Lungs: Clear with good BS bilat   Heart: RRR   Extremities: Trace edema around right ankle   Neuro: MS is good with appropriate affect, pt is alert and Ox3       DATA  REVIEWED:  Lab Results  Component Value Date   HGBA1C 7.3 (A) 02/04/2022   HGBA1C 8.1 (A) 10/02/2021   HGBA1C 7.9 (A) 06/04/2021    Latest Reference Range & Units 02/25/22 09:50  Sodium 135 - 145 mEq/L 141  Potassium 3.5 - 5.1 mEq/L 3.9  Chloride 96 - 112 mEq/L 106  CO2 19 - 32 mEq/L 27  Glucose 70 - 99 mg/dL 113 (H)  BUN 6 - 23 mg/dL 21  Creatinine 0.40 - 1.20 mg/dL 1.17  Calcium 8.4 - 10.5 mg/dL 10.3  Alkaline Phosphatase 39 - 117 U/L 88  Albumin 3.5 - 5.2 g/dL 3.9  AST 0 - 37 U/L 16  ALT 0 - 35 U/L 10  Total Protein 6.0 - 8.3 g/dL 7.2  Total Bilirubin 0.2 - 1.2 mg/dL 0.3  GFR >60.00 mL/min 42.14 (L)    Old records , labs and images have been reviewed.   ASSESSMENT / PLAN / RECOMMENDATIONS:   1) Type 2 Diabetes Mellitus, Optimally controlled - Most recent A1c of 7.3 %. Goal A1c < 8.0 %.    -Patient and daughter are concerned about BG fluctuations from severe hyperglycemia to hypoglycemia -Today for example the patient took her insulin an hour before she ate breakfast, we discussed the importance of taking her insulin with the first and the last meal of the day and not waiting too long before she eats nor after she eats as this would create glucose fluctuations -The patient tends to have hypoglycemia overnight, will reduce her supper dose -I did assure the family that postprandial hyperglycemia is expected immediately after eating, and they should wait a couple hours to reassess the glucose -I also explained to the family that hypoglycemia can be fatal and its important to eliminate those as a priority -I will increase her insulin during the day and reduce it at night as below  -Refill on pen needles was sent per daughter's request  MEDICATIONS: Take NovoLog Mix 13 units with breakfast and 10 units with supper  EDUCATION / INSTRUCTIONS: BG monitoring instructions: Patient is instructed to check her blood sugars 3 times a day, before meals. Call [facility]  cl[facility] if: BG persistently < 70  I reviewed the Rule of 15 for the treatment of hypoglycemia in detail with the patient. Literature supplied.   F/U with Dr. Dwyane Dee   Signed electronically by: Mack Guise, MD  [facility]  [facility] [address] [place], [place] [address] Phone: [phone] FAX: [phone]   CC: Vivi Barrack, [address] [place] [address] Phone: [phone]  Fax: [phone]  Return to Endocrinology clinic as below: Future Appointments  Date Time Provider [place]  05/31/2022  3:00 PM Jaci Desanto, Melanie Crazier, MD [facility] None  08/05/2022 11:00 AM [facility] LAB [facility] None  08/07/2022  8:40 AM Vivi Barrack, MD [facility]  08/08/2022 10:00 AM Elayne Snare, MD [facility] None  09/20/2022 11:40 AM Ladell Pier, MD [facility] None  09/25/2022 11:30 AM Rondel Jumbo, PA-C [facility] None  11/25/2022  3:30 PM Clayton Bibles, NP [facility] None  06/06/2023 10:00 AM [facility] HEALTH COACH [facility]

## 2022-05-31 NOTE — Patient Instructions (Signed)
Take Novolog Mix 13 units with Breakfast and 10 units with Supper      HOW TO TREAT LOW BLOOD SUGARS (Blood sugar LESS THAN 70 MG/DL) Please follow the RULE OF 15 for the treatment of hypoglycemia treatment (when your (blood sugars are less than 70 mg/dL)   STEP 1: Take 15 grams of carbohydrates when your blood sugar is low, which includes:  3-4 GLUCOSE TABS  OR 3-4 OZ OF JUICE OR REGULAR SODA OR ONE TUBE OF GLUCOSE GEL    STEP 2: RECHECK blood sugar in 15 MINUTES STEP 3: If your blood sugar is still low at the 15 minute recheck --> then, go back to STEP 1 and treat AGAIN with another 15 grams of carbohydrates.

## 2022-06-03 ENCOUNTER — Ambulatory Visit: Payer: Medicare (Managed Care) | Admitting: Family Medicine

## 2022-06-07 ENCOUNTER — Other Ambulatory Visit: Payer: Self-pay | Admitting: Family Medicine

## 2022-06-14 ENCOUNTER — Encounter: Payer: Self-pay | Admitting: Internal Medicine

## 2022-07-02 ENCOUNTER — Ambulatory Visit: Payer: Medicare (Managed Care) | Admitting: Endocrinology

## 2022-07-26 ENCOUNTER — Other Ambulatory Visit: Payer: Medicare (Managed Care)

## 2022-07-29 ENCOUNTER — Other Ambulatory Visit: Payer: Self-pay | Admitting: Physician Assistant

## 2022-07-30 ENCOUNTER — Ambulatory Visit: Payer: Medicare (Managed Care) | Admitting: Endocrinology

## 2022-07-31 ENCOUNTER — Ambulatory Visit: Payer: Medicare (Managed Care) | Attending: Internal Medicine | Admitting: Internal Medicine

## 2022-07-31 ENCOUNTER — Encounter: Payer: Self-pay | Admitting: Internal Medicine

## 2022-07-31 VITALS — BP 130/60 | HR 48 | Ht 60.0 in | Wt 128.4 lb

## 2022-07-31 DIAGNOSIS — G459 Transient cerebral ischemic attack, unspecified: Secondary | ICD-10-CM

## 2022-07-31 DIAGNOSIS — I10 Essential (primary) hypertension: Secondary | ICD-10-CM

## 2022-07-31 DIAGNOSIS — R001 Bradycardia, unspecified: Secondary | ICD-10-CM | POA: Diagnosis not present

## 2022-07-31 DIAGNOSIS — E1142 Type 2 diabetes mellitus with diabetic polyneuropathy: Secondary | ICD-10-CM

## 2022-07-31 DIAGNOSIS — R42 Dizziness and giddiness: Secondary | ICD-10-CM | POA: Diagnosis not present

## 2022-07-31 DIAGNOSIS — E782 Mixed hyperlipidemia: Secondary | ICD-10-CM

## 2022-07-31 DIAGNOSIS — R011 Cardiac murmur, unspecified: Secondary | ICD-10-CM | POA: Diagnosis not present

## 2022-07-31 NOTE — Progress Notes (Signed)
Cardiology Office Note:    Date:  07/31/22  ID:  Tammy Hughes, DOB December 16, 1932, MRN [id number]  PCP:  Vivi Barrack, MD  Cardiologist:  Elouise Munroe, MD  Electrophysiologist:  None   Referring MD: Vivi Barrack, MD   Chief Complaint: Cardiovascular follow-up, dizziness  History of Present Illness:    Tammy Hughes is a 89 y.o. female with a hx of diabetes on insulin, hyperlipidemia, prior gastric adenocarcinoma, who presents today for an annual visit.   Today, she is overall well. She is accompanied by a family member. We discussed her EKG which shows sinus brady and 1st deg AVB. She has been well without syncope, dizziness or lightheadedness recently. We reviewed future possibility of pacing if she becomes symptomatic with conduction system disease. She is open to getting a pacemaker if she may need one at any time in the future. Her heart rate in clininc is 48 and her blood pressure is 130/60.   She usually stays active by cleaning and moving around her house. She doesn't tend to stay active outside her home.   She  is compliant with her 75 mg of plavix and 5 mg of rosuvastatin.  We discussed her recent cholesterol labs, which are well controlled. A1C seems acceptable for age.   She  denies any palpitations, chest pain, shortness of breath, or peripheral edema. No lightheadedness, headaches, syncope, orthopnea, or PND.  Past Medical History:  Diagnosis Date   Allergy    SEASONAL   ANXIETY 03/08/2008   Cataract    REMOVED BILATERAL   Depression    DIVERTICULOSIS, COLON 03/08/2008   DVT (deep venous thrombosis) ([place])    GERD 02/23/2007   HYPERCHOLESTEROLEMIA 02/01/2008   HYPERTENSION 03/08/2008   Insulin dependent diabetes mellitus    OSTEOARTHRITIS 02/23/2007   OSTEOPOROSIS 02/23/2007   PERIPHERAL NEUROPATHY 02/23/2007   STD (sexually transmitted disease)    Stomach cancer ([place]) 07/20/14   invasive adenocarcinoma w/signet rings features   Urinary incontinence     Urolithiasis     Past Surgical History:  Procedure Laterality Date   ABDOMINAL SURGERY     CARDIAC CATHETERIZATION N/A 02/02/2016   Procedure: Left Heart Cath and Coronary Angiography;  Surgeon: Peter M Martinique, MD;  Location: [facility];  Service: Cardiovascular;  Laterality: N/A;   CHOLECYSTECTOMY  1995   ESOPHAGOGASTRODUODENOSCOPY  06/08/2002   GASTRECTOMY N/A 07/20/2014   Procedure: PROXIMAL GASTRECTOMY;  Surgeon: Stark Klein, MD;  Location: [facility];  Service: General;  Laterality: N/A;   GASTROJEJUNOSTOMY N/A 07/20/2014   Procedure: GASTROJEJUNOSTOMY;  Surgeon: Stark Klein, MD;  Location: [facility];  Service: General;  Laterality: N/A;   LAPAROSCOPY N/A 07/20/2014   Procedure: LAPAROSCOPY DIAGNOSTIC;  Surgeon: Stark Klein, MD;  Location: [facility];  Service: General;  Laterality: N/A;    Current Medications: Current Meds  Medication Sig   Acetaminophen (TYLENOL PO) Take 500 mg by mouth 2 (two) times daily.    calcium carbonate (TUMS - DOSED IN MG ELEMENTAL CALCIUM) 500 MG chewable tablet Chew 1 tablet by mouth daily. As needed   clopidogrel (PLAVIX) 75 MG tablet TAKE 1 TABLET BY MOUTH ONCE DAILY   Continuous Blood Gluc Receiver (FREESTYLE LIBRE 2 READER) DEVI Use as instructed to check blood sugars   Continuous Blood Gluc Sensor (FREESTYLE LIBRE 2 SENSOR) MISC CHANGE EVERY 14 DAYS   donepezil (ARICEPT) 10 MG tablet TAKE 1 TABLET BY MOUTH ONCE DAILY   escitalopram (LEXAPRO) 5 MG tablet TAKE 1  TABLET BY MOUTH ONCE DAILY   glucose blood (ONETOUCH VERIO) test strip Use as instructed   insulin aspart protamine - aspart (NOVOLOG MIX 70/30 FLEXPEN) (70-30) 100 UNIT/ML FlexPen Inject 13 Units into the skin daily with breakfast AND 10 Units daily with supper.   Insulin Pen Needle 32G X 4 MM MISC 1 Device by Does not apply route in the morning and at bedtime.   meclizine (ANTIVERT) 25 MG tablet TAKE 1 TABLET BY MOUTH THREE TIMES DAILY IF NEEDED FOR DIZZINESS   mirtazapine (REMERON) 15 MG tablet  TAKE 1 TABLET BY MOUTH AT BEDTIME   pantoprazole (PROTONIX) 40 MG tablet Take 1 tablet (40 mg total) by mouth daily.   rosuvastatin (CRESTOR) 5 MG tablet TAKE 1 TABLET BY MOUTH ONCE DAILY   silver sulfADIAZINE (SILVADENE) 1 % cream Apply 1 Application topically daily.     Allergies:   Pirfenidone and Aspirin   Social History   Socioeconomic History   Marital status: Married    Spouse name: Not on file   Number of children: Not on file   Years of education: Not on file   Highest education level: Not on file  Occupational History   Occupation: Retired    Fish farm manager: RETIRED  Tobacco Use   Smoking status: Never   Smokeless tobacco: Never  Vaping Use   Vaping Use: Never used  Substance and Sexual Activity   Alcohol use: No    Alcohol/week: 0.0 standard drinks of alcohol   Drug use: No   Sexual activity: Not Currently    Birth control/protection: Post-menopausal  Other Topics Concern   Not on file  Social History Narrative   Retired-Domestic Assistant   Right handed    Lives with husband    Lives in a one story home   Social Determinants of Health   Financial Resource Strain: Low Risk  (05/24/2022)   Overall Financial Resource Strain (CARDIA)    Difficulty of Paying Living Expenses: Not hard at all  Food Insecurity: No Food Insecurity (05/24/2022)   Hunger Vital Sign    Worried About Running Out of Food in the Last Year: Never true    [place] in the Last Year: Never true  Transportation Needs: No Transportation Needs (05/24/2022)   PRAPARE - Hydrologist (Medical): No    Lack of Transportation (Non-Medical): No  Physical Activity: Insufficiently Active (05/24/2022)   Exercise Vital Sign    Days of Exercise per Week: 2 days    Minutes of Exercise per Session: 30 min  Stress: No Stress Concern Present (05/24/2022)   [place]    Feeling of Stress : Not at all  Social  Connections: Moderately Integrated (05/24/2022)   Social Connection and Isolation Panel [NHANES]    Frequency of Communication with Friends and Family: More than three times a week    Frequency of Social Gatherings with Friends and Family: More than three times a week    Attends Religious Services: More than 4 times per year    Active Member of [employer] or Organizations: No    Attends Music therapist: Never    Marital Status: Married     Family History: The patient's family history includes Cancer in her father, sister, and sister; Diabetes in her sister and sister; Other in her mother.  ROS:   Please see the history of present illness.     All other systems reviewed  and are negative.  EKGs/Labs/Other Studies Reviewed:    The following studies were reviewed today:  EKG:  EKG is personally reviewed. 06/01/2023: Sinus bradycardia. Rate 48 bpm. 1st degree AV block. PR interval 256 ms.  06/28/2021: Sinus bradycardia with first-degree AV block 56 bpm- No acute changes   Monitor 04/2021: IMPRESSION:  1 episode of SVT, 5 beats in duration with no associated diary events. Rare SVE and VE beats.   Echo 01/26/2019: IMPRESSIONS   1. The left ventricle has hyperdynamic systolic function, with an  ejection fraction of >65%. The cavity size was normal. There is mild  concentric left ventricular hypertrophy. Left ventricular diastolic  Doppler parameters are consistent with impaired  relaxation. Elevated left atrial and left ventricular end-diastolic  pressures. Normal GLS: -23.3%.   2. The right ventricle has normal systolic function. The cavity was  normal. There is no increase in right ventricular wall thickness.   3. The aortic valve is tricuspid. No stenosis of the aortic valve.    Recent Labs: 02/25/2022: ALT 10; BUN 21; Creatinine, Ser 1.17; Potassium 3.9; Sodium 141  Recent Lipid Panel    Component Value Date/Time   CHOL 126 02/25/2022 0950   TRIG 115.0 02/25/2022  0950   HDL 42.80 02/25/2022 0950   CHOLHDL 3 02/25/2022 0950   VLDL 23.0 02/25/2022 0950   LDLCALC 60 02/25/2022 0950   LDLDIRECT 159.0 03/19/2019 1121    Physical Exam:    VS:  BP 130/60   Pulse (!) 48   Ht 5' (1.524 m)   Wt 128 lb 6.4 oz (58.2 kg)   SpO2 96%   BMI 25.08 kg/m     Wt Readings from Last 5 Encounters:  07/31/22 128 lb 6.4 oz (58.2 kg)  05/31/22 122 lb (55.3 kg)  05/27/22 121 lb 12.8 oz (55.2 kg)  05/24/22 123 lb (55.8 kg)  03/27/22 121 lb (54.9 kg)     Constitutional: No acute distress Eyes: sclera non-icteric, normal conjunctiva and lids ENMT: normal dentition, moist mucous membranes Cardiovascular: regular rhythm, normal rate, 1/6 murmur. Mid peaking systolic murmur at the RUSB. S1 and S2 normal. No jugular venous distention.  Respiratory: clear to auscultation bilaterally GI : normal bowel sounds, soft and nontender. No distention.   MSK: extremities warm, well perfused. No edema.  NEURO: grossly nonfocal exam, moves all extremities. PSYCH: alert and oriented x 3, normal mood and affect.   ASSESSMENT:    1. Systolic murmur   2. Essential hypertension   3. Dizziness   4. Sinus bradycardia, resolved   5. Mixed hyperlipidemia   6. Type 2 diabetes mellitus with peripheral neuropathy (HCC)   7. TIA (transient ischemic attack)     PLAN:    Systolic murmur - Plan: EKG 12-Lead, ECHOCARDIOGRAM COMPLETE - not significant clinically but need to screen in setting of murmur, will update echo  Essential hypertension - well controlled, on no therapy.   Dizziness Sinus bradycardia, resolved - stable EKG today, no recurrence of dizziness. Will obtain ekgs at follow up. Red flag sx reviewed today.   Mixed hyperlipidemia - well controlled, continue crestor 5 mg daily.  TIA - on plavix per neurology.   Total time of encounter: 25 minutes total time of encounter, including 15 minutes spent in face-to-face patient care. This time includes coordination of  care and counseling regarding above issues. Remainder of non-face-to-face time involved reviewing chart documents/testing relevant to the patient encounter and documentation in the medical record.  Cherlynn Kaiser, MD [place]  [facility]   Medication Adjustments/Labs and Tests Ordered: Current medicines are reviewed at length with the patient today.  Concerns regarding medicines are outlined above.  Orders Placed This Encounter  Procedures   EKG 12-Lead   ECHOCARDIOGRAM COMPLETE   No orders of the defined types were placed in this encounter.  Patient Instructions  Medication Instructions:  No medication changes *If you need a refill on your cardiac medications before your next appointment, please call your pharmacy*   Lab Work: None ordered If you have labs (blood work) drawn today and your tests are completely normal, you will receive your results only by: [place] (if you have MyChart) OR A paper copy in the mail If you have any lab test that is abnormal or we need to change your treatment, we will call you to review the results.   Testing/Procedures: Your physician has requested that you have an echocardiogram IN 6 MONTHS. Echocardiography is a painless test that uses sound waves to create images of your heart. It provides your doctor with information about the size and shape of your heart and how well your heart's chambers and valves are working. This procedure takes approximately one hour. There are no restrictions for this procedure. Please do NOT wear cologne, perfume, aftershave, or lotions (deodorant is allowed). Please arrive 15 minutes prior to your appointment time.    Follow-Up: At [facility], you and your health needs are our priority.  As part of our continuing mission to provide you with exceptional heart care, we have created designated Provider Care Teams.  These Care Teams include your primary Cardiologist (physician) and Advanced  Practice Providers (APPs -  Physician Assistants and Nurse Practitioners) who all work together to provide you with the care you need, when you need it.   Your next appointment:   6 month(s)  Provider:   Elouise Munroe, MD       I,Rachel Rivera,acting as a scribe for Elouise Munroe, MD.,have documented all relevant documentation on the behalf of Elouise Munroe, MD,as directed by  Elouise Munroe, MD while in the presence of Elouise Munroe, MD.  I, Elouise Munroe, MD, have reviewed all documentation for this visit. The documentation on 07/31/22 for the exam, diagnosis, procedures, and orders are all accurate and complete.

## 2022-07-31 NOTE — Patient Instructions (Signed)
Medication Instructions:  No medication changes *If you need a refill on your cardiac medications before your next appointment, please call your pharmacy*   Lab Work: None ordered If you have labs (blood work) drawn today and your tests are completely normal, you will receive your results only by: [place] (if you have MyChart) OR A paper copy in the mail If you have any lab test that is abnormal or we need to change your treatment, we will call you to review the results.   Testing/Procedures: Your physician has requested that you have an echocardiogram IN 6 MONTHS. Echocardiography is a painless test that uses sound waves to create images of your heart. It provides your doctor with information about the size and shape of your heart and how well your heart's chambers and valves are working. This procedure takes approximately one hour. There are no restrictions for this procedure. Please do NOT wear cologne, perfume, aftershave, or lotions (deodorant is allowed). Please arrive 15 minutes prior to your appointment time.    Follow-Up: At [facility], you and your health needs are our priority.  As part of our continuing mission to provide you with exceptional heart care, we have created designated Provider Care Teams.  These Care Teams include your primary Cardiologist (physician) and Advanced Practice Providers (APPs -  Physician Assistants and Nurse Practitioners) who all work together to provide you with the care you need, when you need it.   Your next appointment:   6 month(s)  Provider:   Elouise Munroe, MD

## 2022-08-01 ENCOUNTER — Telehealth: Payer: Self-pay

## 2022-08-01 NOTE — Telephone Encounter (Signed)
Daughter called in states PA is needed for [place] 2. Patient completely out. States fingertips are tough and it hurts her to do fingerstick so she has no way to check blood sugars. Please start PA

## 2022-08-02 ENCOUNTER — Other Ambulatory Visit (HOSPITAL_COMMUNITY): Payer: Self-pay

## 2022-08-02 ENCOUNTER — Telehealth: Payer: Self-pay | Admitting: Pharmacy Technician

## 2022-08-02 NOTE — Telephone Encounter (Signed)
Pharmacy Patient Advocate Encounter   Received notification from CMA/MD pt msg that prior authorization for [facility] 3 sensor is required/requested.   PA submitted on 08/02/2022 to (ins) [facility] Medicare([employer] in [place])  via [place] PA Case ID: [id number] Status is pending

## 2022-08-05 ENCOUNTER — Other Ambulatory Visit (INDEPENDENT_AMBULATORY_CARE_PROVIDER_SITE_OTHER): Payer: Medicare (Managed Care)

## 2022-08-05 ENCOUNTER — Other Ambulatory Visit (HOSPITAL_COMMUNITY): Payer: Self-pay

## 2022-08-05 DIAGNOSIS — E1165 Type 2 diabetes mellitus with hyperglycemia: Secondary | ICD-10-CM | POA: Diagnosis not present

## 2022-08-05 DIAGNOSIS — Z794 Long term (current) use of insulin: Secondary | ICD-10-CM

## 2022-08-05 LAB — HEMOGLOBIN A1C: Hgb A1c MFr Bld: 8.1 % — ABNORMAL HIGH (ref 4.6–6.5)

## 2022-08-05 LAB — BASIC METABOLIC PANEL
BUN: 18 mg/dL (ref 6–23)
CO2: 29 mEq/L (ref 19–32)
Calcium: 10.6 mg/dL — ABNORMAL HIGH (ref 8.4–10.5)
Chloride: 104 mEq/L (ref 96–112)
Creatinine, Ser: 1.1 mg/dL (ref 0.40–1.20)
GFR: 45.24 mL/min — ABNORMAL LOW (ref >60.00)
Glucose, Bld: 252 mg/dL — ABNORMAL HIGH (ref 70–99)
Potassium: 4.3 mEq/L (ref 3.5–5.1)
Sodium: 140 mEq/L (ref 135–145)

## 2022-08-05 NOTE — Telephone Encounter (Signed)
Pharmacy Patient Advocate Encounter  Received notification from [facility] (via CoverMyMeds) that the request for prior authorization for [facility] 3 has been denied. It appears they do not cover it yet. The letter states: Please note that the [facility] 14 Day Flash Glucose Monitoring (FGM) System, FreeStyle Libre FGM System, FreeStyle [place] 2 FGM System, Dexcom G6 CGM System, and Dexcom G7 CGM System Reader/Sensor may be covered with prior authorization.     Would you like to continue with the Freestyle 2?  Please be advised we currently do not have a Pharmacist to review denials. If you would like [place] to submit it on your behalf, please provide clinical information to support your reason for appeal and any pertinent information you would like [place] to include with the appeal request. Appeals may take longer 5 business days to be submitted as we prepares necessary documentation. Thanks for your support.

## 2022-08-05 NOTE — Telephone Encounter (Signed)
Faxed form back with additional information  Faxed to [phone]

## 2022-08-07 ENCOUNTER — Encounter: Payer: Self-pay | Admitting: Family Medicine

## 2022-08-07 ENCOUNTER — Ambulatory Visit (INDEPENDENT_AMBULATORY_CARE_PROVIDER_SITE_OTHER): Payer: Medicare (Managed Care) | Admitting: Family Medicine

## 2022-08-07 VITALS — BP 134/68 | HR 58 | Temp 98.0°F | Ht 60.0 in | Wt 125.0 lb

## 2022-08-07 DIAGNOSIS — Z0001 Encounter for general adult medical examination with abnormal findings: Secondary | ICD-10-CM | POA: Diagnosis not present

## 2022-08-07 DIAGNOSIS — E1142 Type 2 diabetes mellitus with diabetic polyneuropathy: Secondary | ICD-10-CM

## 2022-08-07 DIAGNOSIS — I152 Hypertension secondary to endocrine disorders: Secondary | ICD-10-CM

## 2022-08-07 DIAGNOSIS — E1159 Type 2 diabetes mellitus with other circulatory complications: Secondary | ICD-10-CM | POA: Diagnosis not present

## 2022-08-07 DIAGNOSIS — E785 Hyperlipidemia, unspecified: Secondary | ICD-10-CM

## 2022-08-07 DIAGNOSIS — F02A0 Dementia in other diseases classified elsewhere, mild, without behavioral disturbance, psychotic disturbance, mood disturbance, and anxiety: Secondary | ICD-10-CM

## 2022-08-07 DIAGNOSIS — M1990 Unspecified osteoarthritis, unspecified site: Secondary | ICD-10-CM

## 2022-08-07 DIAGNOSIS — E1169 Type 2 diabetes mellitus with other specified complication: Secondary | ICD-10-CM | POA: Diagnosis not present

## 2022-08-07 DIAGNOSIS — G301 Alzheimer's disease with late onset: Secondary | ICD-10-CM

## 2022-08-07 MED ORDER — ESCITALOPRAM OXALATE 10 MG PO TABS: 10.0000 mg | ORAL_TABLET | Freq: Every day | ORAL | 3 refills | Status: DC

## 2022-08-07 NOTE — Assessment & Plan Note (Signed)
Follows with neurology for this.  She is currently on Aricept 10 mg daily, Remeron 15 mg daily, and Lexapro 5 mg daily.  Daughter does note that the Lexapro has not made much of a difference with her interest in doing things or mood.  Per her last neurologist note they had considered increasing dose to 10 mg daily if doing well.  They are agreeable to do this.  We will send a new prescription in today.  They will follow-up with me in a couple weeks via MyChart.  They have follow-up with neurology planned in about 6 weeks.

## 2022-08-07 NOTE — Assessment & Plan Note (Signed)
Blood pressure at goal off meds.

## 2022-08-07 NOTE — Patient Instructions (Signed)
It was very nice to see you today!  We will increase your Lexapro to 10 mg daily.  Sending message in a few weeks on how this is working for you.  It is okay for you to continue with Tylenol for your arthritis.  Please also use compression and ice packs to the area.  It is okay to try a very low-dose of Aleve.  Please let us know if she has any allergic reactions to this.  Will see you back in 6 months.  Please come back to see [place] sooner if needed.  Take care, Dr Jerline Pain  PLEASE NOTE:  If you had any lab tests, please let us know if you have not heard back within a few days. You may see your results on mychart before we have a chance to review them but we will give you a call once they are reviewed by [place].   If we ordered any referrals today, please let us know if you have not heard from their office within the next week.   If you had any urgent prescriptions sent in today, please check with the pharmacy within an hour of our visit to make sure the prescription was transmitted appropriately.   Please try these tips to maintain a healthy lifestyle:  Eat at least 3 REAL meals and 1-2 snacks per day.  Aim for no more than 5 hours between eating.  If you eat breakfast, please do so within one hour of getting up.   Each meal should contain half fruits/vegetables, one quarter protein, and one quarter carbs (no bigger than a computer mouse)  Cut down on sweet beverages. This includes juice, soda, and sweet tea.   Drink at least 1 glass of water with each meal and aim for at least 8 glasses per day  Exercise at least 150 minutes every week.    Preventive Care 55 Years and Older, Female Preventive care refers to lifestyle choices and visits with your health care provider that can promote health and wellness. Preventive care visits are also called wellness exams. What can I expect for my preventive care visit? Counseling Your health care provider may ask you questions about your: Medical  history, including: Past medical problems. Family medical history. Pregnancy and menstrual history. History of falls. Current health, including: Memory and ability to understand (cognition). Emotional well-being. Home life and relationship well-being. Sexual activity and sexual health. Lifestyle, including: Alcohol, nicotine or tobacco, and drug use. Access to firearms. Diet, exercise, and sleep habits. Work and work Statistician. Sunscreen use. Safety issues such as seatbelt and bike helmet use. Physical exam Your health care provider will check your: Height and weight. These may be used to calculate your BMI (body mass index). BMI is a measurement that tells if you are at a healthy weight. Waist circumference. This measures the distance around your waistline. This measurement also tells if you are at a healthy weight and may help predict your risk of certain diseases, such as type 2 diabetes and high blood pressure. Heart rate and blood pressure. Body temperature. Skin for abnormal spots. What immunizations do I need?  Vaccines are usually given at various ages, according to a schedule. Your health care provider will recommend vaccines for you based on your age, medical history, and lifestyle or other factors, such as travel or where you work. What tests do I need? Screening Your health care provider may recommend screening tests for certain conditions. This may include: Lipid and cholesterol levels. Hepatitis  C test. Hepatitis B test. HIV (human immunodeficiency virus) test. STI (sexually transmitted infection) testing, if you are at risk. Lung cancer screening. Colorectal cancer screening. Diabetes screening. This is done by checking your blood sugar (glucose) after you have not eaten for a while (fasting). Mammogram. Talk with your health care provider about how often you should have regular mammograms. BRCA-related cancer screening. This may be done if you have a family  history of breast, ovarian, tubal, or peritoneal cancers. Bone density scan. This is done to screen for osteoporosis. Talk with your health care provider about your test results, treatment options, and if necessary, the need for more tests. Follow these instructions at home: Eating and drinking  Eat a diet that includes fresh fruits and vegetables, whole grains, lean protein, and low-fat dairy products. Limit your intake of foods with high amounts of sugar, saturated fats, and salt. Take vitamin and mineral supplements as recommended by your health care provider. Do not drink alcohol if your health care provider tells you not to drink. If you drink alcohol: Limit how much you have to 0-1 drink a day. Know how much alcohol is in your drink. In the [place] one drink equals one 12 oz bottle of beer (355 mL), one 5 oz glass of wine (148 mL), or one 1 oz glass of hard liquor (44 mL). Lifestyle Brush your teeth every morning and night with fluoride toothpaste. Floss one time each day. Exercise for at least 30 minutes 5 or more days each week. Do not use any products that contain nicotine or tobacco. These products include cigarettes, chewing tobacco, and vaping devices, such as e-cigarettes. If you need help quitting, ask your health care provider. Do not use drugs. If you are sexually active, practice safe sex. Use a condom or other form of protection in order to prevent STIs. Take aspirin only as told by your health care provider. Make sure that you understand how much to take and what form to take. Work with your health care provider to find out whether it is safe and beneficial for you to take aspirin daily. Ask your health care provider if you need to take a cholesterol-lowering medicine (statin). Find healthy ways to manage stress, such as: Meditation, yoga, or listening to music. Journaling. Talking to a trusted person. Spending time with friends and family. Minimize exposure to UV radiation  to reduce your risk of skin cancer. Safety Always wear your seat belt while driving or riding in a vehicle. Do not drive: If you have been drinking alcohol. Do not ride with someone who has been drinking. When you are tired or distracted. While texting. If you have been using any mind-altering substances or drugs. Wear a helmet and other protective equipment during sports activities. If you have firearms in your house, make sure you follow all gun safety procedures. What's next? Visit your health care provider once a year for an annual wellness visit. Ask your health care provider how often you should have your eyes and teeth checked. Stay up to date on all vaccines. This information is not intended to replace advice given to you by your health care provider. Make sure you discuss any questions you have with your health care provider. Document Revised: 12/27/2020 Document Reviewed: 12/27/2020 Elsevier Patient Education  [place].

## 2022-08-07 NOTE — Assessment & Plan Note (Signed)
No red flags.  Pain is about the same.  She has been using Tylenol with modest improvement.  We did recommend compression and ice to the area.  She does have a listed allergy to aspirin as a rash and has not tried any NSAIDs.  Advised daughter if they could try taking a half dose of Aleve to see how she did with this.  If she does well with NSAIDs would consider trial of Celebrex, diclofenac, or Mobic.

## 2022-08-07 NOTE — Progress Notes (Signed)
Chief Complaint:  Tammy Hughes is a 89 y.o. female who presents today for her annual comprehensive physical exam.    Assessment/Plan:  Chronic Problems Addressed Today: Mild dementia ([place]) Follows with neurology for this.  She is currently on Aricept 10 mg daily, Remeron 15 mg daily, and Lexapro 5 mg daily.  Daughter does note that the Lexapro has not made much of a difference with her interest in doing things or mood.  Per her last neurologist note they had considered increasing dose to 10 mg daily if doing well.  They are agreeable to do this.  We will send a new prescription in today.  They will follow-up with me in a couple weeks via MyChart.  They have follow-up with neurology planned in about 6 weeks.  Osteoarthritis No red flags.  Pain is about the same.  She has been using Tylenol with modest improvement.  We did recommend compression and ice to the area.  She does have a listed allergy to aspirin as a rash and has not tried any NSAIDs.  Advised daughter if they could try taking a half dose of Aleve to see how she did with this.  If she does well with NSAIDs would consider trial of Celebrex, diclofenac, or Mobic.  Dyslipidemia associated with type 2 diabetes mellitus (HCC) On Crestor 5 mg daily.  Tolerating well.  Had lipids checked a few months ago with cardiology which was at goal.  Hypertension associated with diabetes ([place]) Blood pressure at goal off meds.  Type 2 diabetes mellitus with peripheral neuropathy ([place]) Follows with endocrinology.  Last A1c 8.1.  She is on insulin 70/30 13 units with breakfast and 2 units with supper.  Preventative Healthcare: Up-to-date on labs.  She is already had shingles vaccine-will need to obtain these records.  No longer needs colon cancer screening due to age.  Patient Counseling(The following topics were reviewed and/or handout was given):  -Nutrition: Stressed importance of moderation in sodium/caffeine intake, saturated fat and  cholesterol, caloric balance, sufficient intake of fresh fruits, vegetables, and fiber.  -Stressed the importance of regular exercise.   -Substance Abuse: Discussed cessation/primary prevention of tobacco, alcohol, or other drug use; driving or other dangerous activities under the influence; availability of treatment for abuse.   -Injury prevention: Discussed safety belts, safety helmets, smoke detector, smoking near bedding or upholstery.   -Sexuality: Discussed sexually transmitted diseases, partner selection, use of condoms, avoidance of unintended pregnancy and contraceptive alternatives.   -Dental health: Discussed importance of regular tooth brushing, flossing, and dental visits.  -Health maintenance and immunizations reviewed. Please refer to Health maintenance section.  Return to care in 1 year for next preventative visit.     Subjective:  HPI:  She has no acute complaints today. See A/p for status of chronic conditions.   Since her last visit she has seen several of her specialist including pulmonology, cardiology, endocrinology, and neurology.  She was recently started on Lexapro by her neurologist.  They have not noticed much improvement with this.  No other significant medications since her last visit.  Lifestyle Diet: Balanced. Small portions.  Exercise: None specific.      08/07/2022    9:04 AM  Depression screen PHQ 2/9  Decreased Interest 3  Down, Depressed, Hopeless 0  PHQ - 2 Score 3  Altered sleeping 2  Tired, decreased energy 1  Change in appetite 2  Feeling bad or failure about yourself  0  Trouble concentrating 1  Moving slowly  or fidgety/restless 1  Suicidal thoughts 0  PHQ-9 Score 10  Difficult doing work/chores Not difficult at all    Health Maintenance Due  Topic Date Due   Zoster Vaccines- Shingrix (1 of 2) Never done   OPHTHALMOLOGY EXAM  02/06/2021     ROS: Per HPI, otherwise a complete review of systems was negative.   PMH:  The following  were reviewed and entered/updated in epic: Past Medical History:  Diagnosis Date   Allergy    SEASONAL   ANXIETY 03/08/2008   Cataract    REMOVED BILATERAL   Depression    DIVERTICULOSIS, COLON 03/08/2008   DVT (deep venous thrombosis) ([place])    GERD 02/23/2007   HYPERCHOLESTEROLEMIA 02/01/2008   HYPERTENSION 03/08/2008   Insulin dependent diabetes mellitus    OSTEOARTHRITIS 02/23/2007   OSTEOPOROSIS 02/23/2007   PERIPHERAL NEUROPATHY 02/23/2007   STD (sexually transmitted disease)    Stomach cancer ([place]) 07/20/14   invasive adenocarcinoma w/signet rings features   Urinary incontinence    Urolithiasis    Patient Active Problem List   Diagnosis Date Noted   Chest pain 07/26/2021   Mild dementia ([place]) 03/21/2021   Insomnia 02/05/2021   Uterine prolapse 01/10/2020   Vitamin D deficiency 03/23/2019   Genital herpes 08/19/2018   CAD (coronary artery disease) 08/13/2018   TIA (transient ischemic attack) 08/13/2018   Normocytic anemia 02/01/2016   Memory loss 08/02/2015   DVT (deep venous thrombosis) ([place]) 10/27/2014   Type 2 diabetes mellitus with peripheral neuropathy ([place]) 08/04/2014   NSVT post-op 07/24/2014   Gastric cancer-s/p gastrectomy 07/20/14 06/15/2014   IPF (idiopathic pulmonary fibrosis) ([place]) 08/06/2013   NASH (nonalcoholic steatohepatitis) 10/21/2011   BACK PAIN, LUMBAR 12/19/2009   Hypertension associated with diabetes ([place]) 03/08/2008   Non-allergic rhinitis 03/08/2008   Dyslipidemia associated with type 2 diabetes mellitus ([place]) 02/01/2008   GERD 02/23/2007   Osteoarthritis 02/23/2007   Osteoporosis 02/23/2007   Past Surgical History:  Procedure Laterality Date   ABDOMINAL SURGERY     CARDIAC CATHETERIZATION N/A 02/02/2016   Procedure: Left Heart Cath and Coronary Angiography;  Surgeon: Peter M Martinique, MD;  Location: [facility];  Service: Cardiovascular;  Laterality: N/A;   CHOLECYSTECTOMY  1995   ESOPHAGOGASTRODUODENOSCOPY  06/08/2002   GASTRECTOMY N/A  07/20/2014   Procedure: PROXIMAL GASTRECTOMY;  Surgeon: Stark Klein, MD;  Location: [facility];  Service: General;  Laterality: N/A;   GASTROJEJUNOSTOMY N/A 07/20/2014   Procedure: GASTROJEJUNOSTOMY;  Surgeon: Stark Klein, MD;  Location: [facility];  Service: General;  Laterality: N/A;   LAPAROSCOPY N/A 07/20/2014   Procedure: LAPAROSCOPY DIAGNOSTIC;  Surgeon: Stark Klein, MD;  Location: [facility];  Service: General;  Laterality: N/A;    Family History  Problem Relation Age of Onset   Cancer Father        Prostate Cancer   Cancer Sister        Breast Cancer   Diabetes Sister    Diabetes Sister    Other Mother        pneumonia    Cancer Sister        breast cancer     Medications- reviewed and updated Current Outpatient Medications  Medication Sig Dispense Refill   Acetaminophen (TYLENOL PO) Take 500 mg by mouth 2 (two) times daily.      calcium carbonate (TUMS - DOSED IN MG ELEMENTAL CALCIUM) 500 MG chewable tablet Chew 1 tablet by mouth daily. As needed     clopidogrel (PLAVIX) 75 MG tablet  TAKE 1 TABLET BY MOUTH ONCE DAILY 90 tablet 0   Continuous Blood Gluc Receiver (FREESTYLE LIBRE 2 READER) DEVI Use as instructed to check blood sugars 1 each 0   Continuous Blood Gluc Sensor (FREESTYLE LIBRE 2 SENSOR) MISC CHANGE EVERY 14 DAYS 6 each 6   donepezil (ARICEPT) 10 MG tablet TAKE 1 TABLET BY MOUTH ONCE DAILY 90 tablet 0   glucose blood (ONETOUCH VERIO) test strip Use as instructed 200 strip 0   insulin aspart protamine - aspart (NOVOLOG MIX 70/30 FLEXPEN) (70-30) 100 UNIT/ML FlexPen Inject 13 Units into the skin daily with breakfast AND 10 Units daily with supper. 30 mL 3   Insulin Pen Needle 32G X 4 MM MISC 1 Device by Does not apply route in the morning and at bedtime. 200 each 3   meclizine (ANTIVERT) 25 MG tablet TAKE 1 TABLET BY MOUTH THREE TIMES DAILY IF NEEDED FOR DIZZINESS 30 tablet 0   mirtazapine (REMERON) 15 MG tablet TAKE 1 TABLET BY MOUTH AT BEDTIME 90 tablet 0   pantoprazole  (PROTONIX) 40 MG tablet Take 1 tablet (40 mg total) by mouth daily. 90 tablet 3   rosuvastatin (CRESTOR) 5 MG tablet TAKE 1 TABLET BY MOUTH ONCE DAILY 90 tablet 0   silver sulfADIAZINE (SILVADENE) 1 % cream Apply 1 Application topically daily. 50 g 0   escitalopram (LEXAPRO) 10 MG tablet Take 1 tablet (10 mg total) by mouth daily. 30 tablet 3   No current facility-administered medications for this visit.    Allergies-reviewed and updated Allergies  Allergen Reactions   Pirfenidone Diarrhea and Nausea And Vomiting   Aspirin     rash    Social History   Socioeconomic History   Marital status: Married    Spouse name: Not on file   Number of children: Not on file   Years of education: Not on file   Highest education level: Not on file  Occupational History   Occupation: Retired    Fish farm manager: RETIRED  Tobacco Use   Smoking status: Never   Smokeless tobacco: Never  Vaping Use   Vaping Use: Never used  Substance and Sexual Activity   Alcohol use: No    Alcohol/week: 0.0 standard drinks of alcohol   Drug use: No   Sexual activity: Not Currently    Birth control/protection: Post-menopausal  Other Topics Concern   Not on file  Social History Narrative   Retired-Domestic Assistant   Right handed    Lives with husband    Lives in a one story home   Social Determinants of Health   Financial Resource Strain: Low Risk  (05/24/2022)   Overall Financial Resource Strain (CARDIA)    Difficulty of Paying Living Expenses: Not hard at all  Food Insecurity: No Food Insecurity (05/24/2022)   Hunger Vital Sign    Worried About Running Out of Food in the Last Year: Never true    [place] in the Last Year: Never true  Transportation Needs: No Transportation Needs (05/24/2022)   PRAPARE - Hydrologist (Medical): No    Lack of Transportation (Non-Medical): No  Physical Activity: Insufficiently Active (05/24/2022)   Exercise Vital Sign    Days of  Exercise per Week: 2 days    Minutes of Exercise per Session: 30 min  Stress: No Stress Concern Present (05/24/2022)   [place]    Feeling of Stress : Not at all  Social Connections: Moderately Integrated (05/24/2022)   Social Connection and Isolation Panel [NHANES]    Frequency of Communication with Friends and Family: More than three times a week    Frequency of Social Gatherings with Friends and Family: More than three times a week    Attends Religious Services: More than 4 times per year    Active Member of [employer] or Organizations: No    Attends Archivist Meetings: Never    Marital Status: Married        Objective:  Physical Exam: BP 134/68   Pulse (!) 58   Temp 98 F (36.7 C) (Temporal)   Ht 5' (1.524 m)   Wt 125 lb (56.7 kg)   SpO2 97%   BMI 24.41 kg/m   Body mass index is 24.41 kg/m. Wt Readings from Last 3 Encounters:  08/07/22 125 lb (56.7 kg)  07/31/22 128 lb 6.4 oz (58.2 kg)  05/31/22 122 lb (55.3 kg)   Gen: NAD, resting comfortably HEENT: TMs normal bilaterally. OP clear. No thyromegaly noted.  CV: RRR with 2/6 systolic murmur appreciated Pulm: NWOB, CTAB with no crackles, wheezes, or rhonchi GI: Normal bowel sounds present. Soft, Nontender, Nondistended. MSK: no edema, cyanosis, or clubbing noted Skin: warm, dry Neuro: CN2-12 grossly intact. Strength 5/5 in upper and lower extremities. Reflexes symmetric and intact bilaterally.  Psych: Normal affect and thought content     Grier Czerwinski M. Jerline Pain, MD 08/07/2022 9:24 AM

## 2022-08-07 NOTE — Assessment & Plan Note (Signed)
Follows with endocrinology.  Last A1c 8.1.  She is on insulin 70/30 13 units with breakfast and 2 units with supper.

## 2022-08-07 NOTE — Assessment & Plan Note (Signed)
On Crestor 5 mg daily.  Tolerating well.  Had lipids checked a few months ago with cardiology which was at goal.

## 2022-08-08 ENCOUNTER — Encounter: Payer: Self-pay | Admitting: Endocrinology

## 2022-08-08 ENCOUNTER — Ambulatory Visit (INDEPENDENT_AMBULATORY_CARE_PROVIDER_SITE_OTHER): Payer: Medicare (Managed Care) | Admitting: Endocrinology

## 2022-08-08 VITALS — BP 142/74 | HR 68 | Ht 60.0 in | Wt 124.2 lb

## 2022-08-08 DIAGNOSIS — E1165 Type 2 diabetes mellitus with hyperglycemia: Secondary | ICD-10-CM | POA: Diagnosis not present

## 2022-08-08 DIAGNOSIS — Z794 Long term (current) use of insulin: Secondary | ICD-10-CM

## 2022-08-08 NOTE — Progress Notes (Signed)
Patient ID: Tammy Hughes, female   DOB: [birth year], 89 y.o.   MRN: [id number]            Reason for Appointment: Type II Diabetes follow-up   History of Present Illness   Diagnosis date: 1995  Previous history:  Non-insulin hypoglycemic drugs previously used:?  Metformin, detailed history not available Insulin was started in 2005 Appears to have been on premixed insulin for several years She is appears to have relatively stable control except since about early 2022  A1c range in the last few years is: 7-8.1  Recent history:     Non-insulin hypoglycemic drugs: None     Insulin regimen:   NovoLog Mix 70/30 13 units a.m. and 10 units evening    Side effects from medications: None  Current self management, blood sugar patterns and problems identified:  A1c is 8.1 She was told on her previous appointment to take her evening insulin before dinnertime On a follow-up visit with use of the freestyle libre analysis she was recommended reducing her evening dose by 2 units although her blood sugars overnight were in the mid 100 range but her daytime blood sugars especially in the mornings were significantly higher Also her morning insulin was increased by 1 unit She does not check her sugars and has not brought her libre reader which she has not used for at least a week Waiting prior authorization for [place] 3 sensor Has been taking her evening insulin for meals as directed and her husband has confirmed this No hypoglycemic symptoms  Exercise: Unable to do much walking Diet management: For breakfast she will usually have eggs with grits and bacon     Hypoglycemia: None diet as above  Glucometer: One Touch.           Blood Glucose readings not available   Dietician visit: Most recent:    Several years ago  Weight control:  Wt Readings from Last 3 Encounters:  08/08/22 124 lb 3.2 oz (56.3 kg)  08/07/22 125 lb (56.7 kg)  07/31/22 128 lb 6.4 oz (58.2 kg)             Diabetes labs:  Lab Results  Component Value Date   HGBA1C 8.1 (H) 08/05/2022   HGBA1C 7.3 (A) 05/31/2022   HGBA1C 7.3 (A) 02/04/2022   Lab Results  Component Value Date   MICROALBUR 15.3 (H) 02/25/2022   LDLCALC 60 02/25/2022   CREATININE 1.10 08/05/2022    Lab Results  Component Value Date   FRUCTOSAMINE 317 (H) 04/18/2020     Allergies as of 08/08/2022       Reactions   Pirfenidone Diarrhea, Nausea And Vomiting   Aspirin    rash        Medication List        Accurate as of August 08, 2022  4:39 PM. If you have any questions, ask your nurse or doctor.          calcium carbonate 500 MG chewable tablet Commonly known as: TUMS - dosed in mg elemental calcium Chew 1 tablet by mouth daily. As needed   clopidogrel 75 MG tablet Commonly known as: PLAVIX TAKE 1 TABLET BY MOUTH ONCE DAILY   donepezil 10 MG tablet Commonly known as: ARICEPT TAKE 1 TABLET BY MOUTH ONCE DAILY   escitalopram 10 MG tablet Commonly known as: LEXAPRO Take 1 tablet (10 mg total) by mouth daily.   FreeStyle [place] 2 Reader [place] Use as instructed to check  blood sugars   FreeStyle Libre 2 Sensor Misc CHANGE EVERY 14 DAYS   Insulin Pen Needle 32G X 4 MM Misc 1 Device by Does not apply route in the morning and at bedtime.   meclizine 25 MG tablet Commonly known as: ANTIVERT TAKE 1 TABLET BY MOUTH THREE TIMES DAILY IF NEEDED FOR DIZZINESS   mirtazapine 15 MG tablet Commonly known as: REMERON TAKE 1 TABLET BY MOUTH AT BEDTIME   NovoLOG Mix 70/30 FlexPen (70-30) 100 UNIT/ML FlexPen Generic drug: insulin aspart protamine - aspart Inject 13 Units into the skin daily with breakfast AND 10 Units daily with supper.   OneTouch Verio test strip Generic drug: glucose blood Use as instructed   pantoprazole 40 MG tablet Commonly known as: PROTONIX Take 1 tablet (40 mg total) by mouth daily.   rosuvastatin 5 MG tablet Commonly known as: CRESTOR TAKE 1 TABLET BY MOUTH ONCE  DAILY   silver sulfADIAZINE 1 % cream Commonly known as: SILVADENE Apply 1 Application topically daily.   TYLENOL PO Take 500 mg by mouth 2 (two) times daily.        Allergies:  Allergies  Allergen Reactions   Pirfenidone Diarrhea and Nausea And Vomiting   Aspirin     rash    Past Medical History:  Diagnosis Date   Allergy    SEASONAL   ANXIETY 03/08/2008   Cataract    REMOVED BILATERAL   Depression    DIVERTICULOSIS, COLON 03/08/2008   DVT (deep venous thrombosis) ([place])    GERD 02/23/2007   HYPERCHOLESTEROLEMIA 02/01/2008   HYPERTENSION 03/08/2008   Insulin dependent diabetes mellitus    OSTEOARTHRITIS 02/23/2007   OSTEOPOROSIS 02/23/2007   PERIPHERAL NEUROPATHY 02/23/2007   STD (sexually transmitted disease)    Stomach cancer ([place]) 07/20/14   invasive adenocarcinoma w/signet rings features   Urinary incontinence    Urolithiasis     Past Surgical History:  Procedure Laterality Date   ABDOMINAL SURGERY     CARDIAC CATHETERIZATION N/A 02/02/2016   Procedure: Left Heart Cath and Coronary Angiography;  Surgeon: Peter M Martinique, MD;  Location: [facility];  Service: Cardiovascular;  Laterality: N/A;   CHOLECYSTECTOMY  1995   ESOPHAGOGASTRODUODENOSCOPY  06/08/2002   GASTRECTOMY N/A 07/20/2014   Procedure: PROXIMAL GASTRECTOMY;  Surgeon: Stark Klein, MD;  Location: [facility];  Service: General;  Laterality: N/A;   GASTROJEJUNOSTOMY N/A 07/20/2014   Procedure: GASTROJEJUNOSTOMY;  Surgeon: Stark Klein, MD;  Location: [facility];  Service: General;  Laterality: N/A;   LAPAROSCOPY N/A 07/20/2014   Procedure: LAPAROSCOPY DIAGNOSTIC;  Surgeon: Stark Klein, MD;  Location: [facility];  Service: General;  Laterality: N/A;    Family History  Problem Relation Age of Onset   Cancer Father        Prostate Cancer   Cancer Sister        Breast Cancer   Diabetes Sister    Diabetes Sister    Other Mother        pneumonia    Cancer Sister        breast cancer     Social History:  reports  that she has never smoked. She has never used smokeless tobacco. She reports that she does not drink alcohol and does not use drugs.  Review of Systems:  Last diabetic eye exam date 2021?  Last urine microalbumin date: 8/23  Last foot exam date: 8/23  Symptoms of neuropathy: None  Hypertension: Not present ACE/ARB medication: None  BP Readings from Last  3 Encounters:  08/08/22 (!) 142/74  08/07/22 134/68  07/31/22 130/60    Lipid management: Currently taking Crestor 5 mg daily     Lab Results  Component Value Date   CHOL 126 02/25/2022   CHOL 118 09/20/2019   CHOL 221 (H) 03/19/2019   Lab Results  Component Value Date   HDL 42.80 02/25/2022   HDL 42.00 09/20/2019   HDL 41.60 03/19/2019   Lab Results  Component Value Date   LDLCALC 60 02/25/2022   LDLCALC 52 09/20/2019   LDLCALC 96 11/21/2017   Lab Results  Component Value Date   TRIG 115.0 02/25/2022   TRIG 119.0 09/20/2019   TRIG 215.0 (H) 03/19/2019   Lab Results  Component Value Date   CHOLHDL 3 02/25/2022   CHOLHDL 3 09/20/2019   CHOLHDL 5 03/19/2019   Lab Results  Component Value Date   LDLDIRECT 159.0 03/19/2019   LDLDIRECT 102.9 03/23/2014   LDLDIRECT 104.1 08/06/2013   She has a history of mild memory loss    Examination:   BP (!) 142/74 (BP Location: Left Arm, Patient Position: Sitting, Cuff Size: Normal)   Pulse 68   Ht 5' (1.524 m)   Wt 124 lb 3.2 oz (56.3 kg)   SpO2 96%   BMI 24.26 kg/m   Body mass index is 24.26 kg/m.   No pedal edema  ASSESSMENT/ PLAN:    Diabetes type 2 on long-term insulin:   Current regimen: NovoLog mix twice a day  See history of present illness for detailed discussion of current diabetes management, blood sugar patterns and problems identified  A1c is higher at 8.3  Although for her age and comorbid conditions her level of control is likely adequate she is seen to have consistent hyperglycemia during the day as judged by her previous CGM Lab  glucose 252 even with eating a balanced meal in the morning Unable to get freestyle libre 3 approved as yet  Recommendations:  She will bring her libre reader tomorrow for download and review In the meantime recommend taking 15 units in the morning before breakfast and continue 10 units in the evening for now  Patient Instructions  Take 15 units in am and 10 at supper   Elayne Snare 08/08/2022, 4:39 PM

## 2022-08-08 NOTE — Patient Instructions (Signed)
Take 15 units in am and 10 at supper

## 2022-08-21 ENCOUNTER — Other Ambulatory Visit (HOSPITAL_COMMUNITY): Payer: Medicare (Managed Care)

## 2022-09-02 ENCOUNTER — Other Ambulatory Visit: Payer: Self-pay | Admitting: Physician Assistant

## 2022-09-20 ENCOUNTER — Inpatient Hospital Stay: Payer: Medicare (Managed Care) | Attending: Oncology | Admitting: Oncology

## 2022-09-20 VITALS — BP 155/66 | HR 58 | Temp 97.9°F | Resp 18 | Ht 60.0 in | Wt 127.6 lb

## 2022-09-20 DIAGNOSIS — C169 Malignant neoplasm of stomach, unspecified: Secondary | ICD-10-CM | POA: Diagnosis not present

## 2022-09-20 DIAGNOSIS — E119 Type 2 diabetes mellitus without complications: Secondary | ICD-10-CM | POA: Diagnosis not present

## 2022-09-20 DIAGNOSIS — Z79899 Other long term (current) drug therapy: Secondary | ICD-10-CM | POA: Insufficient documentation

## 2022-09-20 DIAGNOSIS — Z9221 Personal history of antineoplastic chemotherapy: Secondary | ICD-10-CM | POA: Insufficient documentation

## 2022-09-20 DIAGNOSIS — F32A Depression, unspecified: Secondary | ICD-10-CM | POA: Insufficient documentation

## 2022-09-20 DIAGNOSIS — Z85028 Personal history of other malignant neoplasm of stomach: Secondary | ICD-10-CM | POA: Diagnosis present

## 2022-09-20 DIAGNOSIS — J8410 Pulmonary fibrosis, unspecified: Secondary | ICD-10-CM | POA: Insufficient documentation

## 2022-09-20 DIAGNOSIS — Z86718 Personal history of other venous thrombosis and embolism: Secondary | ICD-10-CM | POA: Diagnosis not present

## 2022-09-20 NOTE — Progress Notes (Signed)
  [facility] OFFICE PROGRESS NOTE   Diagnosis: Gastric cancer  INTERVAL HISTORY:   [name] returns as scheduled.  She is here with her daughter.  She reports feeling well.  Good appetite.  No nausea.  No difficulty with bowel function.  She continues follow-up with Dr. Elsworth Soho for pulmonary fibrosis.  Objective:  Vital signs in last 24 hours:  Blood pressure (!) 155/66, pulse (!) 58, temperature 97.9 F (36.6 C), temperature source Oral, resp. rate 18, height 5' (1.524 m), weight 127 lb 9.6 oz (57.9 kg), SpO2 98 %.    HEENT: 0.5 cm mobile nodule at the left submandibular gland, oropharynx without visible mass Lymphatics: No cervical, supraclavicular, axillary, or inguinal nodes Resp: Diffuse inspiratory rales, chiefly at the lower posterior chest bilaterally Cardio: Regular rate and rhythm GI: Nontender, no mass, no apparent ascites, no hepatosplenomegaly Vascular: No leg edema, the right lower leg is slightly larger than the left side    Lab Results:  Lab Results  Component Value Date   WBC 5.3 09/07/2020   HGB 12.0 09/07/2020   HCT 36.2 09/07/2020   MCV 90.5 09/07/2020   PLT 271.0 09/07/2020   NEUTROABS 2.5 09/07/2020    CMP  Lab Results  Component Value Date   NA 140 08/05/2022   K 4.3 08/05/2022   CL 104 08/05/2022   CO2 29 08/05/2022   GLUCOSE 252 (H) 08/05/2022   BUN 18 08/05/2022   CREATININE 1.10 08/05/2022   CALCIUM 10.6 (H) 08/05/2022   PROT 7.2 02/25/2022   ALBUMIN 3.9 02/25/2022   AST 16 02/25/2022   ALT 10 02/25/2022   ALKPHOS 88 02/25/2022   BILITOT 0.3 02/25/2022   GFRNONAA 58 (L) 03/22/2020   GFRAA 67 03/22/2020     Medications: I have reviewed the patient's current medications.   Assessment/Plan: Gastric cancer, status post an endoscopic biopsy of a lesser curvature mass on 05/31/2014 confirming adenocarcinoma Staging CTs of the chest and abdomen on 06/08/2014 revealed no evidence of metastatic disease Proximal  gastrectomy and feeding jejunostomy 07/20/2014, pT2,pN1 Adjuvant weekly 5-Fu/leucovorin 09/14/2014, 09/21/2014, 09/28/2014 and 10/05/2014 Initiation of radiation and infusional 5-FU 10/17/2014 5-fluorouracil pump discontinued on 10/27/2014 due to toxicity. Radiation discontinued 11/02/2014    Pulmonary fibrosis Esophageal stricture, status post a dilatation procedure 05/31/2014 Diabetes Postoperative ventricle tachycardia, NSTEMI January 2016 Hospital-acquired pneumonia January 2016 CT abdomen/pelvis 09/02/2014 with postsurgical changes. Focal fluid collection in the midline abutting the distal greater curvature of the stomach measuring 2.2 x 2.9 x 3.4 cm. Mild stranding in the adjacent fat. PICC placement 10/17/2014 Rash. Most likely related to the 5-fluorouracil. Resolved. Right upper extremity DVT 10/25/2014. She completed a course of Xarelto. Hospitalization 10/27/2014 through 10/28/2014 nausea/vomiting and left arm discomfort. Noted to have skin toxicity from the 5-fluorouracil. The 5-fluorouracil was discontinued. Depression. She continues Remeron.  Improved.    Disposition: Tammy Hughes appears stable.  She is in clinical remission from gastric cancer.  She like to continue follow-up at the cancer center.  She will return for an office visit in 9 months.  The tiny nodule overlying the left submandibular gland is likely a benign finding.  [name], MD  09/20/2022  12:30 PM

## 2022-09-23 NOTE — Telephone Encounter (Signed)
Pharmacy Patient Advocate Encounter  Received notification from [facility] that the request for prior authorization for [facility] 3 has been denied.  Looks like this ins company just doesn't cover the 3 yet.   Letter already in pt's media tab.

## 2022-09-25 ENCOUNTER — Telehealth (INDEPENDENT_AMBULATORY_CARE_PROVIDER_SITE_OTHER): Payer: Medicare (Managed Care) | Admitting: Physician Assistant

## 2022-09-25 DIAGNOSIS — R413 Other amnesia: Secondary | ICD-10-CM

## 2022-09-25 NOTE — Progress Notes (Signed)
Virtual Visit via Video Note The purpose of this virtual visit is to provide medical care in a patient that is unable to be seen in person due to physical or health limitations   Consent was obtained for video visit:  yes  Answered questions that patient had about telehealth interaction:  yes I discussed the limitations, risks, security and privacy concerns of performing an evaluation and management service by telemedicine. I also discussed with the patient that there may be a patient responsible charge related to this service. The patient expressed understanding and agreed to proceed.  Pt location: Home Physician Location: office Name of referring provider:  Vivi Barrack, MD I connected with Dorris Fetch at patients initiation/request on 09/25/2022 at 11:30 AM EDT by video enabled telemedicine application and verified that I am speaking with the correct person using two identifiers. Pt MRN:  [id number] Pt DOB:  1933-06-12 Video Participants:  Dorris Fetch;  daughter    Assessment and Plan:    Memory Impairment likely due to Alzheimer's Disease  ELODY GERHOLD is a very pleasant 89 y.o. RH female with a history of hypertension, hyperlipidemia, gastric cancer followed at the cancer center, pulmonary fibrosis, history of esophageal stricture, DM2 with latest A1c above 8, history of NSTEMI January 2016, depression, history of right upper extremity DVT in 2016, seen today in follow up for memory loss. Patient is currently on donepezil 10 mg daily, tolerating well.  Her memory is stable.    Follow up in 6 months. Continue donepezil 10 mg daily Continue mirtazapine reduce 7.5 mg nightly to decrease daytime sleepiness and excessive nighttime sleepiness Continue Lexapro10 mg daily for depression as per PCP Consider joining adult day programs or increasing activities at the facility. Continue to control cardiovascular risk factors, patient on daily Plavix   History of Present  Illness:      This patient is accompanied in the office by her daughter who supplements the history.  Previous records as well as any outside records available were reviewed prior to todays visit. Patient was last seen on last MMSE was 27/30 in March 2023 she is seen today via video visit due to sore throat, patient is concerned about upper respiratory infection.   Any changes in memory since last visit?  "About the same .Sometimes I can't remember what I am doing.  She continues to do crossword puzzles, word finding and enjoys the most activities of daily retirement community.  She also enjoys going to charge. repeats oneself?  Endorsed Disoriented when walking into a room?  Patient denies  Leaving objects in unusual places?  denies   Wandering behavior?  denies   Any personality changes since last visit?  denies   Any worsening depression?:  denies , "but she is more quiet than prior " Hallucinations or paranoia?  denies   Seizures?    denies    Any sleep changes? She sleeps well, denies vivid dreams, REM behavior or sleepwalking.  The patient takes Remeron 15 mg nightly, and then sleeps more extensively during the morning. Sleep apnea?   denies   Any hygiene concerns?    denies   Independent of bathing and dressing?  Endorsed  Does the patient needs help with medications?  Daughter is in charge  Who is in charge of the finances?  Husband is in charge    Any changes in appetite?  denies    Patient have trouble swallowing?  denies   Does  the patient cook? Yes, no accidents  Any headaches?   denies   Chronic back pain  denies   Ambulates with difficulty?  She chronic pain due to arthritis  Recent falls or head injuries? denies     Unilateral weakness, numbness or tingling?    denies   Any tremors?  denies   Any anosmia?  Patient denies   Any incontinence of urine?  She has mild incontinence, uses a pad Any bowel dysfunction?     denies      Patient lives with her husband in the  retirement community Does the patient drive? No longer drives     History on Initial Assessment 12/28/2019: This is an 89 year old right-handed woman with a history of hypertension, hyperlipidemia, diabetes, stomach cancer, anxiety, depression, presenting for evaluation of memory loss. Her daughter [name] is present to provide additional information. She states "I just can't remember." [name] started noticing changes around 4 years ago after she underwent chemotherapy and radiation treatment, worse in the past year. She repeats herself several times. She lives with her husband. She was diagnosed with cancer 4 years ago and was forgetting bill payments, her husband took over at that time. She denies getting lost driving. She forgets her medications, they had to count her pills and find that she has not taken them as instructed. She denies leaving the stove on but [name] reports she has burned food, thinking she turned the burner off. She denies misplacing things. She is independent with dressing and bathing. Her 2 sisters and brother had Alzheimer's disease. No concussions or alcohol use. MMSE 24/30 in PCP office last 09/2019.   She has infrequent headaches, occasional dizziness/vertigo, low back pain, urinary incontinence. She denies any diplopia, dysarthria/dysphagia, focal numbness/tingling/weakness, tremors, or anosmia. No falls. Sleep is not good sometimes, she occasionally takes naps. No wandering behavior. Her daughter notes she is a little more irritable than before, she does not like a lot of people around. No hallucinations or paranoia.        Current Outpatient Medications on File Prior to Visit  Medication Sig Dispense Refill   Acetaminophen (TYLENOL PO) Take 500 mg by mouth 2 (two) times daily.      calcium carbonate (TUMS - DOSED IN MG ELEMENTAL CALCIUM) 500 MG chewable tablet Chew 1 tablet by mouth daily. As needed (Patient not taking: Reported on 09/20/2022)     clopidogrel (PLAVIX) 75 MG  tablet TAKE 1 TABLET BY MOUTH ONCE DAILY 90 tablet 0   Continuous Blood Gluc Receiver (FREESTYLE LIBRE 2 READER) DEVI Use as instructed to check blood sugars 1 each 0   Continuous Blood Gluc Sensor (FREESTYLE LIBRE 2 SENSOR) MISC CHANGE EVERY 14 DAYS 6 each 6   donepezil (ARICEPT) 10 MG tablet TAKE 1 TABLET BY MOUTH ONCE DAILY 90 tablet 0   escitalopram (LEXAPRO) 10 MG tablet Take 1 tablet (10 mg total) by mouth daily. 30 tablet 3   glucose blood (ONETOUCH VERIO) test strip Use as instructed 200 strip 0   insulin aspart protamine - aspart (NOVOLOG MIX 70/30 FLEXPEN) (70-30) 100 UNIT/ML FlexPen Inject 13 Units into the skin daily with breakfast AND 10 Units daily with supper. 30 mL 3   Insulin Pen Needle 32G X 4 MM MISC 1 Device by Does not apply route in the morning and at bedtime. 200 each 3   meclizine (ANTIVERT) 25 MG tablet TAKE 1 TABLET BY MOUTH THREE TIMES DAILY IF NEEDED FOR DIZZINESS 30 tablet 0  mirtazapine (REMERON) 15 MG tablet TAKE 1 TABLET BY MOUTH AT BEDTIME 90 tablet 0   pantoprazole (PROTONIX) 40 MG tablet Take 1 tablet (40 mg total) by mouth daily. 90 tablet 3   rosuvastatin (CRESTOR) 5 MG tablet TAKE 1 TABLET BY MOUTH ONCE DAILY 90 tablet 0   silver sulfADIAZINE (SILVADENE) 1 % cream Apply 1 Application topically daily. (Patient not taking: Reported on 09/20/2022) 50 g 0   No current facility-administered medications on file prior to visit.     Observations/Objective:   There were no vitals filed for this visit. GEN:  The patient appears stated age and is in NAD.  Neurological examination: Patient is awake, alert, oriented x 3. No aphasia or dysarthria. Intact fluency and comprehension. Remote and recent memory impaired. Able to name and repeat sentences. Cranial nerves: Extraocular movements intact with no nystagmus. No facial asymmetry. Motor: moves all extremities symmetrically, at least anti-gravity x 4. No incoordination on finger to nose testing. Gait: narrow-based and  steady, able to tandem walk adequately. Negative Romberg test.      09/20/2021    8:00 AM 03/21/2021    1:00 PM 07/13/2020   11:00 AM  MMSE - Mini Mental State Exam  Orientation to time '5 4 4  '$ Orientation to Place '5 5 5  '$ Registration '3 3 3  '$ Attention/ Calculation 4 1 0  Recall '2 3 2  '$ Language- name 2 objects '2 2 2  '$ Language- repeat 1 1 0  Language- follow 3 step command '3 3 3  '$ Language- read & follow direction '1 1 1  '$ Write a sentence '1 1 1  '$ Copy design 0 0 0  Total score '27 24 21        '$ No data to display          Follow Up Instructions:    -I discussed the assessment and treatment plan with the patient. The patient was provided an opportunity to ask questions and all were answered. The patient agreed with the plan and demonstrated an understanding of the instructions.   The patient was advised to call back or seek an in-person evaluation if the symptoms worsen or if the condition fails to improve as anticipated.    Total time spent on today's visit was 17:30 minutes, including both face-to-face time and nonface-to-face time.  Time included that spent on review of records (prior notes available to me/labs/imaging if pertinent), discussing treatment and goals, answering patient's questions and coordinating care.   Sharene Butters, PA-C

## 2022-09-30 ENCOUNTER — Other Ambulatory Visit: Payer: Self-pay | Admitting: Family Medicine

## 2022-10-15 ENCOUNTER — Other Ambulatory Visit: Payer: Self-pay | Admitting: Family Medicine

## 2022-10-15 ENCOUNTER — Other Ambulatory Visit (INDEPENDENT_AMBULATORY_CARE_PROVIDER_SITE_OTHER): Payer: Medicare (Managed Care)

## 2022-10-15 DIAGNOSIS — E1165 Type 2 diabetes mellitus with hyperglycemia: Secondary | ICD-10-CM

## 2022-10-15 DIAGNOSIS — Z794 Long term (current) use of insulin: Secondary | ICD-10-CM | POA: Diagnosis not present

## 2022-10-15 LAB — HEMOGLOBIN A1C: Hgb A1c MFr Bld: 7.7 % — ABNORMAL HIGH (ref 4.6–6.5)

## 2022-10-15 LAB — GLUCOSE, RANDOM: Glucose, Bld: 168 mg/dL — ABNORMAL HIGH (ref 70–99)

## 2022-10-16 ENCOUNTER — Encounter: Payer: Self-pay | Admitting: Endocrinology

## 2022-10-16 ENCOUNTER — Ambulatory Visit (INDEPENDENT_AMBULATORY_CARE_PROVIDER_SITE_OTHER): Payer: Medicare (Managed Care) | Admitting: Endocrinology

## 2022-10-16 VITALS — HR 61 | Ht 60.0 in | Wt 126.0 lb

## 2022-10-16 DIAGNOSIS — Z794 Long term (current) use of insulin: Secondary | ICD-10-CM | POA: Diagnosis not present

## 2022-10-16 DIAGNOSIS — E1165 Type 2 diabetes mellitus with hyperglycemia: Secondary | ICD-10-CM

## 2022-10-16 NOTE — Patient Instructions (Addendum)
Leave off juice in am  If sugar low at nite reduce pm insulin to 8 units

## 2022-10-16 NOTE — Progress Notes (Signed)
Patient ID: Tammy Hughes, female   DOB: 13-Dec-1932, 89 y.o.   MRN: [id number]            Reason for Appointment: Type II Diabetes follow-up   History of Present Illness   Diagnosis date: 1995  Previous history:  Non-insulin hypoglycemic drugs previously used:?  Metformin, detailed history not available Insulin was started in 2005 Appears to have been on premixed insulin for several years She is appears to have relatively stable control except since about early 2022  A1c range in the last few years is: 7-8.1  Recent history:     Non-insulin hypoglycemic drugs: None     Insulin regimen:   NovoLog Mix 70/30 15 units a.m. and 10 units before dinner    Side effects from medications: None  Current self management, blood sugar patterns and problems identified:  A1c is 7.7 now Today patient is accompanied by her daughter  She has been able to use the freestyle libre sensor Although this shows highly variable blood sugars during the day her time in range is 72% She appears to be having mostly postprandial spikes after breakfast and sometimes after the other meals However overnight blood sugars are relatively good and only rarely low normal When she made this appointment she was concerned about getting low sugars in the 60s overnight However with taking a half a peanut butter sandwich at bedtime she is not getting any more low sugars overnight She says he is using a light supper Eating a large breakfast with toast, grits, juice, eggs and sausage Blood sugars may frequently go up well over 200 after breakfast and occasionally over 300 at the other times of the day She verified that she is taking her insulin before starting to eat   Exercise: Unable to do any walking  Interpretation of her libre sensor is as follows  Glucose variability is relatively high with GV 36% Most of the variability during the day with frequent but irregular and inconsistent mealtime  spikes Overnight blood sugars are moderately variable but generally mildly increased with lowest average 120, only once had a low normal reading of 65 No hypoglycemia during the day with rarely blood sugars as low as 67 Postprandial readings after breakfast are going up fairly consistently with variable excursions As above blood sugar may also spike periodically after lunch but less often after dinner No prior comparison  CGM use % of time 79  2-week average/GV 162/36  Time in range     72   %  % Time Above 180 18  % Time above 250   % Time Below 70 0     PRE-MEAL Fasting Lunch Dinner Bedtime Overall  Glucose range:       Averages: 120       POST-MEAL PC Breakfast PC Lunch PC Dinner  Glucose range:     Averages: 182 188 180     Dietician visit: Most recent:    Several years ago  Weight control:  Wt Readings from Last 3 Encounters:  10/16/22 126 lb (57.2 kg)  09/20/22 127 lb 9.6 oz (57.9 kg)  08/08/22 124 lb 3.2 oz (56.3 kg)            Diabetes labs:  Lab Results  Component Value Date   HGBA1C 7.7 (H) 10/15/2022   HGBA1C 8.1 (H) 08/05/2022   HGBA1C 7.3 (A) 05/31/2022   Lab Results  Component Value Date   MICROALBUR 15.3 (H) 02/25/2022   [place]  60 02/25/2022   CREATININE 1.10 08/05/2022    Lab Results  Component Value Date   FRUCTOSAMINE 317 (H) 04/18/2020     Allergies as of 10/16/2022       Reactions   Pirfenidone Diarrhea, Nausea And Vomiting   Aspirin    rash        Medication List        Accurate as of October 16, 2022 11:35 AM. If you have any questions, ask your nurse or doctor.          calcium carbonate 500 MG chewable tablet Commonly known as: TUMS - dosed in mg elemental calcium Chew 1 tablet by mouth daily. As needed   clopidogrel 75 MG tablet Commonly known as: PLAVIX TAKE 1 TABLET BY MOUTH ONCE DAILY   donepezil 10 MG tablet Commonly known as: ARICEPT TAKE 1 TABLET BY MOUTH ONCE DAILY   escitalopram 10 MG  tablet Commonly known as: LEXAPRO Take 1 tablet (10 mg total) by mouth daily.   FreeStyle [place] 2 Reader [employer] Use as instructed to check blood sugars   FreeStyle Libre 2 Sensor Misc CHANGE EVERY 14 DAYS   Insulin Pen Needle 32G X 4 MM Misc 1 Device by Does not apply route in the morning and at bedtime.   meclizine 25 MG tablet Commonly known as: ANTIVERT TAKE 1 TABLET BY MOUTH THREE TIMES DAILY IF NEEDED FOR DIZZINESS   mirtazapine 15 MG tablet Commonly known as: REMERON TAKE 1 TABLET BY MOUTH AT BEDTIME   NovoLOG Mix 70/30 FlexPen (70-30) 100 UNIT/ML FlexPen Generic drug: insulin aspart protamine - aspart Inject 13 Units into the skin daily with breakfast AND 10 Units daily with supper.   OneTouch Verio test strip Generic drug: glucose blood Use as instructed   pantoprazole 40 MG tablet Commonly known as: PROTONIX Take 1 tablet (40 mg total) by mouth daily.   rosuvastatin 5 MG tablet Commonly known as: CRESTOR TAKE 1 TABLET BY MOUTH ONCE DAILY   silver sulfADIAZINE 1 % cream Commonly known as: SILVADENE Apply 1 Application topically daily.   TYLENOL PO Take 500 mg by mouth 2 (two) times daily.        Allergies:  Allergies  Allergen Reactions   Pirfenidone Diarrhea and Nausea And Vomiting   Aspirin     rash    Past Medical History:  Diagnosis Date   Allergy    SEASONAL   ANXIETY 03/08/2008   Cataract    REMOVED BILATERAL   Depression    DIVERTICULOSIS, COLON 03/08/2008   DVT (deep venous thrombosis)    GERD 02/23/2007   HYPERCHOLESTEROLEMIA 02/01/2008   HYPERTENSION 03/08/2008   Insulin dependent diabetes mellitus    OSTEOARTHRITIS 02/23/2007   OSTEOPOROSIS 02/23/2007   PERIPHERAL NEUROPATHY 02/23/2007   STD (sexually transmitted disease)    Stomach cancer 07/20/14   invasive adenocarcinoma w/signet rings features   Urinary incontinence    Urolithiasis     Past Surgical History:  Procedure Laterality Date   ABDOMINAL SURGERY     CARDIAC  CATHETERIZATION N/A 02/02/2016   Procedure: Left Heart Cath and Coronary Angiography;  Surgeon: Peter M Martinique, MD;  Location: [facility];  Service: Cardiovascular;  Laterality: N/A;   CHOLECYSTECTOMY  1995   ESOPHAGOGASTRODUODENOSCOPY  06/08/2002   GASTRECTOMY N/A 07/20/2014   Procedure: PROXIMAL GASTRECTOMY;  Surgeon: Stark Klein, MD;  Location: [facility];  Service: General;  Laterality: N/A;   GASTROJEJUNOSTOMY N/A 07/20/2014   Procedure: GASTROJEJUNOSTOMY;  Surgeon: Stark Klein,  MD;  Location: [facility];  Service: General;  Laterality: N/A;   LAPAROSCOPY N/A 07/20/2014   Procedure: LAPAROSCOPY DIAGNOSTIC;  Surgeon: Stark Klein, MD;  Location: [facility];  Service: General;  Laterality: N/A;    Family History  Problem Relation Age of Onset   Cancer Father        Prostate Cancer   Cancer Sister        Breast Cancer   Diabetes Sister    Diabetes Sister    Other Mother        pneumonia    Cancer Sister        breast cancer     Social History:  reports that she has never smoked. She has never used smokeless tobacco. She reports that she does not drink alcohol and does not use drugs.  Review of Systems:  Last diabetic eye exam date 2021?  Last urine microalbumin date: 8/23  Last foot exam date: 8/23  Symptoms of neuropathy: None  Hypertension: Not present ACE/ARB medication: None  BP Readings from Last 3 Encounters:  09/20/22 (!) 155/66  08/08/22 (!) 142/74  08/07/22 134/68    Lipid management: Currently taking Crestor 5 mg daily     Lab Results  Component Value Date   CHOL 126 02/25/2022   CHOL 118 09/20/2019   CHOL 221 (H) 03/19/2019   Lab Results  Component Value Date   HDL 42.80 02/25/2022   HDL 42.00 09/20/2019   HDL 41.60 03/19/2019   Lab Results  Component Value Date   LDLCALC 60 02/25/2022   LDLCALC 52 09/20/2019   LDLCALC 96 11/21/2017   Lab Results  Component Value Date   TRIG 115.0 02/25/2022   TRIG 119.0 09/20/2019   TRIG 215.0 (H) 03/19/2019    Lab Results  Component Value Date   CHOLHDL 3 02/25/2022   CHOLHDL 3 09/20/2019   CHOLHDL 5 03/19/2019   Lab Results  Component Value Date   LDLDIRECT 159.0 03/19/2019   LDLDIRECT 102.9 03/23/2014   LDLDIRECT 104.1 08/06/2013   She has a history of memory loss    Examination:   Pulse 61   Ht 5' (1.524 m)   Wt 126 lb (57.2 kg)   SpO2 96%   BMI 24.61 kg/m   Body mass index is 24.61 kg/m.     ASSESSMENT/ PLAN:    Diabetes type 2 on long-term insulin:   Current regimen: NovoLog mix twice a day  See history of present illness for detailed discussion of current diabetes management, blood sugar patterns and problems identified  A1c is slightly better at 7.7  Although her blood sugars are only 72% within the target range this appears to be adequate for her age and comorbid conditions  Now able to use the freestyle libre sensor although not using the [place] 2 consistently Her family is generally helping her with managing her diabetes on diet because of her dementia  Most significant pattern is high blood sugars after breakfast which has at least 2 carbohydrates along with some juice Also likely based on her diet will have high readings after lunch or dinner However higher blood sugar on an average after meals is only about 188  Recommendations:   She will continue to taking 15 units in the morning before breakfast and continue 10 units in the evening for now If she starts getting low sugars overnight again she can reduce the supper dose to 8 units Avoid drinking juice in the morning Also if blood sugars are  still higher after breakfast may need to leave off her grades Make sure she takes her insulin before starting to eat  Patient Instructions  Leave off juice in am   Elayne Snare 10/16/2022, 11:35 AM

## 2022-10-21 ENCOUNTER — Ambulatory Visit: Payer: Medicare (Managed Care) | Admitting: Family Medicine

## 2022-10-24 ENCOUNTER — Encounter: Payer: Self-pay | Admitting: Endocrinology

## 2022-10-24 ENCOUNTER — Encounter: Payer: Self-pay | Admitting: Family Medicine

## 2022-10-24 ENCOUNTER — Ambulatory Visit (INDEPENDENT_AMBULATORY_CARE_PROVIDER_SITE_OTHER): Payer: Medicare (Managed Care) | Admitting: Family Medicine

## 2022-10-24 VITALS — BP 171/79 | HR 52 | Temp 98.2°F | Ht 60.0 in | Wt 127.2 lb

## 2022-10-24 DIAGNOSIS — I152 Hypertension secondary to endocrine disorders: Secondary | ICD-10-CM | POA: Diagnosis not present

## 2022-10-24 DIAGNOSIS — J029 Acute pharyngitis, unspecified: Secondary | ICD-10-CM | POA: Diagnosis not present

## 2022-10-24 DIAGNOSIS — E1159 Type 2 diabetes mellitus with other circulatory complications: Secondary | ICD-10-CM

## 2022-10-24 DIAGNOSIS — E1142 Type 2 diabetes mellitus with diabetic polyneuropathy: Secondary | ICD-10-CM

## 2022-10-24 LAB — POCT RAPID STREP A (OFFICE): Rapid Strep A Screen: POSITIVE — AB

## 2022-10-24 MED ORDER — AMOXICILLIN 500 MG PO CAPS: 500.0000 mg | ORAL_CAPSULE | Freq: Two times a day (BID) | ORAL | 0 refills | Status: AC

## 2022-10-24 NOTE — Assessment & Plan Note (Signed)
Following with endocrinology for this.  Will be establishing with endocrinologist in a couple of months.  Last A1c stable 7.7.

## 2022-10-24 NOTE — Assessment & Plan Note (Signed)
Elevated today.  She left the office before we could recheck.  She has typically been well-controlled here in the office.  She has had intermittent elevations at previous doctors appointments as well.  Given her well-controlled here for the last several office visits we will not start any antihypertensives at this time.  They will monitor at home for a couple of weeks and follow-up with [place] via mychart.  Would consider starting low-dose antihypertensive if needed if pressures are significantly elevated at home.

## 2022-10-24 NOTE — Progress Notes (Signed)
   Tammy Hughes is a 89 y.o. female who presents today for an office visit.  Assessment/Plan:  New/Acute Problems: Throat Pain Rapid strep positive.  This likely explains quite a few of her symptoms.  We will treat with course of amoxicillin.  She has previously tolerated amoxicillin well.  Encouraged hydration.  She can use over-the-counter meds as needed as well.  She will let us know if symptoms are not improving.  Soft Tissue Mass Follows with oncology as well and they noted this on her recent exam.  Potentially reactive lymph node in setting of above strep pharyngitis.  Will treat as above and she will let us know if not improving.  Chronic Problems Addressed Today: Hypertension associated with diabetes (HCC) Elevated today.  She left the office before we could recheck.  She has typically been well-controlled here in the office.  She has had intermittent elevations at previous doctors appointments as well.  Given her well-controlled here for the last several office visits we will not start any antihypertensives at this time.  They will monitor at home for a couple of weeks and follow-up with [place] via mychart.  Would consider starting low-dose antihypertensive if needed if pressures are significantly elevated at home.  Type 2 diabetes mellitus with peripheral neuropathy [facility]) Following with endocrinology for this.  Will be establishing with endocrinologist in a couple of months.  Last A1c stable 7.7.     Subjective:  HPI:  See A/P for status of chronic conditions.  Patient is here with throat pain.  Started 2 to 3 weeks ago.  No specific treatments tried.  She is also had associated symptoms of chills and headache.  She has additionally felt a lump in the left side of her throat for the last several weeks.  Energy levels have been normal.  No fevers.  No treatments tried.  No known sick contacts        Objective:  Physical Exam: BP (!) 171/79   Pulse (!) 52   Temp 98.2 F (36.8  C) (Temporal)   Ht 5' (1.524 m)   Wt 127 lb 3.2 oz (57.7 kg)   SpO2 97%   BMI 24.84 kg/m   Gen: No acute distress, resting comfortably HEENT: OP erythematous.  Left submandibular lymphadenopathy noted. CV: Regular rate and rhythm with no murmurs appreciated Pulm: Normal work of breathing, clear to auscultation bilaterally with no crackles, wheezes, or rhonchi Neuro: Grossly normal, moves all extremities Psych: Normal affect and thought content      Malosi Hemstreet M. Jimmey Ralph, MD 10/24/2022 11:59 AM

## 2022-10-24 NOTE — Patient Instructions (Signed)
It was very nice to see you today!  You have strep throat.  Please start the amoxicillin.  Let us know if not improving.  Please keep an eye on your blood pressure and let us know if it is persistently elevated at home in a couple of weeks.  Take care, Dr Jimmey Ralph  PLEASE NOTE:  If you had any lab tests, please let us know if you have not heard back within a few days. You may see your results on mychart before we have a chance to review them but we will give you a call once they are reviewed by [place].   If we ordered any referrals today, please let us know if you have not heard from their office within the next week.   If you had any urgent prescriptions sent in today, please check with the pharmacy within an hour of our visit to make sure the prescription was transmitted appropriately.   Please try these tips to maintain a healthy lifestyle:  Eat at least 3 REAL meals and 1-2 snacks per day.  Aim for no more than 5 hours between eating.  If you eat breakfast, please do so within one hour of getting up.   Each meal should contain half fruits/vegetables, one quarter protein, and one quarter carbs (no bigger than a computer mouse)  Cut down on sweet beverages. This includes juice, soda, and sweet tea.   Drink at least 1 glass of water with each meal and aim for at least 8 glasses per day  Exercise at least 150 minutes every week.

## 2022-11-10 ENCOUNTER — Other Ambulatory Visit: Payer: Self-pay | Admitting: Endocrinology

## 2022-11-10 DIAGNOSIS — E1165 Type 2 diabetes mellitus with hyperglycemia: Secondary | ICD-10-CM

## 2022-11-12 ENCOUNTER — Ambulatory Visit: Payer: Medicare (Managed Care) | Admitting: "Endocrinology

## 2022-11-25 ENCOUNTER — Encounter: Payer: Self-pay | Admitting: Nurse Practitioner

## 2022-11-25 ENCOUNTER — Ambulatory Visit (INDEPENDENT_AMBULATORY_CARE_PROVIDER_SITE_OTHER): Payer: Medicare (Managed Care) | Admitting: Nurse Practitioner

## 2022-11-25 VITALS — BP 124/70 | HR 55 | Temp 97.8°F | Ht 60.0 in | Wt 126.4 lb

## 2022-11-25 DIAGNOSIS — J84112 Idiopathic pulmonary fibrosis: Secondary | ICD-10-CM | POA: Diagnosis not present

## 2022-11-25 DIAGNOSIS — K219 Gastro-esophageal reflux disease without esophagitis: Secondary | ICD-10-CM

## 2022-11-25 NOTE — Progress Notes (Unsigned)
@Patient  ID: Tammy Hughes, female    DOB: 09-29-1932, 89 y.o.   MRN: [id number]  Chief Complaint  Patient presents with   Follow-up    No c/o     Referring provider: Ardith Dark, MD  HPI: 89 year old female, never smoker followed for idiopathic pulmonary fibrosis. Previously treated with Esbriet but did not tolerate it. She is a patient of Dr. Reginia Naas and last seen in office on 01/21/2022. Past medical history significant for HTN, CAD, hx of DVT, TIA, non-allergic rhinitis, GERD, NASH, gastric cancer Stage IIA s/p gastrectomy 2016, HLD, DM II, mild dementia, osteoarthritis, back pain, insomnia.   TEST/EVENTS:  09/2013 PFTs: DLCO 72%, TLC 93%, FEV1 80% 10/2014 CTA chest: ILD, favored fibrotic NSIP, stable from 2015 11/2020 HRCT: widespread but patchy areas of ground-glass attenuation, septal thickening, subpleural reticulation, parenchymal banding, traction bronchiectasis and honeycombing. Findings have a mild craniocaudal gradient and are clearly progressive compared to prior study from 2016 12/2020 PFTs: FVC 88%, DLCO 12.57 (75%) 12/21/2021 HRCT chest: atherosclerosis. Moderate pulmonary fibrosis with apical to basal gradient. Honeycombing at bases. UIP pattern. Not significantly changed when compared to 2022 but progressed when compared to 2016.   01/21/2022: OV with Dr. Vassie Loll. CT stable from 2022, worse compared to 2016. Unable to tolerate Esbriet in the past. Discussion surrounding antifibrotic treatment with Ofev; pt declined. Walking oximetry without desaturation. F/u 6 months.   05/27/2022: Today - follow up Patient presents today with her daughter for six month follow up. Breathing is stable. She doesn't really have much shortness of breath at baseline. No increased cough or chest congestion. Main symptom is fatigue. Lives a very sedentary lifestyle. Her daughter tries to encourage her to be more active. She is not on any antifibrotic therapy. Previously unable to tolerate Esbriet and  declined to try Ofev d/t side effect profile. Previously started on protonix d/t burning chest discomfort; this has significantly helped. Would like to continue this.   Allergies  Allergen Reactions   Pirfenidone Diarrhea and Nausea And Vomiting   Aspirin     rash    Immunization History  Administered Date(s) Administered   Covid-19, Mrna,Vaccine(Spikevax)56yrs and older 05/14/2022   Fluad Quad(high Dose 65+) 03/19/2019, 04/18/2020, 06/04/2021, 05/01/2022   Influenza Split 07/15/2009, 07/15/2010, 05/16/2011, 04/16/2012, 05/15/2013   Influenza Whole 04/11/2008, 03/15/2010   Influenza, High Dose Seasonal PF 05/27/2017, 04/24/2018   Influenza,inj,Quad PF,6+ Mos 04/06/2014, 03/28/2015, 03/15/2016   Influenza-Unspecified 04/13/2013   Moderna SARS-COV2 Booster Vaccination 10/25/2020   Moderna Sars-Covid-2 Vaccination 09/09/2019, 10/07/2019, 05/09/2020   Pneumococcal Conjugate-13 11/12/2013   Pneumococcal Polysaccharide-23 07/15/1993, 07/15/2004, 07/23/2011   Td 01/12/1998, 07/17/2007   Tdap 11/21/2017   Zoster Recombinat (Shingrix) 02/05/2022, 06/04/2022   Zoster, Live 06/25/2016    Past Medical History:  Diagnosis Date   Allergy    SEASONAL   ANXIETY 03/08/2008   Cataract    REMOVED BILATERAL   Depression    DIVERTICULOSIS, COLON 03/08/2008   DVT (deep venous thrombosis) (HCC)    GERD 02/23/2007   HYPERCHOLESTEROLEMIA 02/01/2008   HYPERTENSION 03/08/2008   Insulin dependent diabetes mellitus    OSTEOARTHRITIS 02/23/2007   OSTEOPOROSIS 02/23/2007   PERIPHERAL NEUROPATHY 02/23/2007   STD (sexually transmitted disease)    Stomach cancer (HCC) 07/20/14   invasive adenocarcinoma w/signet rings features   Urinary incontinence    Urolithiasis     Tobacco History: Social History   Tobacco Use  Smoking Status Never  Smokeless Tobacco Never   Counseling given: Not Answered  Outpatient Medications Prior to Visit  Medication Sig Dispense Refill   Acetaminophen (TYLENOL PO) Take  500 mg by mouth 2 (two) times daily.      calcium carbonate (TUMS - DOSED IN MG ELEMENTAL CALCIUM) 500 MG chewable tablet Chew 1 tablet by mouth daily. As needed     clopidogrel (PLAVIX) 75 MG tablet TAKE 1 TABLET BY MOUTH ONCE DAILY 90 tablet 0   Continuous Blood Gluc Sensor (FREESTYLE LIBRE 2 SENSOR) MISC CHANGE EVERY 14 DAYS 6 each 6   Continuous Glucose Receiver (FREESTYLE LIBRE 2 READER) DEVI USE AS INSTRUCTED TO CHECK BLOOD SUGARS 6 each 0   donepezil (ARICEPT) 10 MG tablet TAKE 1 TABLET BY MOUTH ONCE DAILY 90 tablet 0   escitalopram (LEXAPRO) 10 MG tablet Take 1 tablet (10 mg total) by mouth daily. 30 tablet 3   glucose blood (ONETOUCH VERIO) test strip Use as instructed 200 strip 0   insulin aspart protamine - aspart (NOVOLOG MIX 70/30 FLEXPEN) (70-30) 100 UNIT/ML FlexPen Inject 13 Units into the skin daily with breakfast AND 10 Units daily with supper. 30 mL 3   Insulin Pen Needle 32G X 4 MM MISC 1 Device by Does not apply route in the morning and at bedtime. 200 each 3   meclizine (ANTIVERT) 25 MG tablet TAKE 1 TABLET BY MOUTH THREE TIMES DAILY IF NEEDED FOR DIZZINESS 30 tablet 0   mirtazapine (REMERON) 15 MG tablet TAKE 1 TABLET BY MOUTH AT BEDTIME 90 tablet 0   pantoprazole (PROTONIX) 40 MG tablet Take 1 tablet (40 mg total) by mouth daily. 90 tablet 3   rosuvastatin (CRESTOR) 5 MG tablet TAKE 1 TABLET BY MOUTH ONCE DAILY 90 tablet 0   No facility-administered medications prior to visit.     Review of Systems:   Constitutional: No weight loss or gain, night sweats, fevers, chills, or lassitude. +fatigue (baseline) HEENT: No headaches, difficulty swallowing, tooth/dental problems, or sore throat. No sneezing, itching, ear ache, nasal congestion, or post nasal drip CV:  No chest pain, orthopnea, PND, swelling in lower extremities, anasarca, dizziness, palpitations, syncope Resp: +cough (unchanged from baseline; occasional). No shortness of breath with exertion or at rest. No excess  mucus or change in color of mucus. No hemoptysis. No wheezing.  No chest wall deformity GI:  No indigestion, heartburn, abdominal pain, nausea, vomiting, diarrhea, change in bowel habits, loss of appetite, bloody stools.  GU: No dysuria, change in color of urine, urgency or frequency.  No flank pain, no hematuria  Skin: No rash, lesions, ulcerations MSK:  No joint pain or swelling.  No decreased range of motion.  No back pain. Neuro: No dizziness or lightheadedness.  Psych: No depression or anxiety. Mood stable.     Physical Exam:  BP 124/70   Pulse (!) 55   Temp 97.8 F (36.6 C) (Oral)   Ht 5' (1.524 m)   Wt 126 lb 6.4 oz (57.3 kg)   SpO2 95%   BMI 24.69 kg/m   GEN: Pleasant, interactive, well-nourished; in no acute distress. HEENT:  Normocephalic and atraumatic. PERRLA. Sclera white. Nasal turbinates pink, moist and patent bilaterally. No rhinorrhea present. Oropharynx pink and moist, without exudate or edema. No lesions, ulcerations, or postnasal drip.  NECK:  Supple w/ fair ROM. No JVD present. Normal carotid impulses w/o bruits. Thyroid symmetrical with no goiter or nodules palpated. No lymphadenopathy.   CV: RRR, no m/r/g, no peripheral edema. Pulses intact, +2 bilaterally. No cyanosis, pallor or clubbing. PULMONARY:  Unlabored,  regular breathing. Bibasilar crackles otherwise clear bilaterally A&P w/o wheezes/rhonchi. No accessory muscle use. No dullness to percussion. GI: BS present and normoactive. Soft, non-tender to palpation. No organomegaly or masses detected.  MSK: No erythema, warmth or tenderness. Cap refil <2 sec all extrem. No deformities or joint swelling noted.  Neuro: A/Ox3. No focal deficits noted.   Skin: Warm, no lesions or rashe Psych: Normal affect and behavior. Judgement and thought content appropriate.     Lab Results:  CBC    Component Value Date/Time   WBC 5.3 09/07/2020 1207   RBC 4.00 09/07/2020 1207   HGB 12.0 09/07/2020 1207   HGB 11.0 (L)  11/07/2014 1001   HCT 36.2 09/07/2020 1207   HCT 34.3 (L) 11/07/2014 1001   PLT 271.0 09/07/2020 1207   PLT 242 11/07/2014 1001   MCV 90.5 09/07/2020 1207   MCV 89.6 11/07/2014 1001   MCH 29.8 06/21/2020 1402   MCHC 33.2 09/07/2020 1207   RDW 13.3 09/07/2020 1207   RDW 21.5 (H) 11/07/2014 1001   LYMPHSABS 2.0 09/07/2020 1207   LYMPHSABS 0.4 (L) 11/07/2014 1001   MONOABS 0.5 09/07/2020 1207   MONOABS 0.8 11/07/2014 1001   EOSABS 0.2 09/07/2020 1207   EOSABS 0.2 11/07/2014 1001   BASOSABS 0.0 09/07/2020 1207   BASOSABS 0.0 11/07/2014 1001    BMET    Component Value Date/Time   NA 140 08/05/2022 1117   NA 139 11/07/2014 1001   K 4.3 08/05/2022 1117   K 4.6 11/07/2014 1001   CL 104 08/05/2022 1117   CO2 29 08/05/2022 1117   CO2 26 11/07/2014 1001   GLUCOSE 168 (H) 10/15/2022 1102   GLUCOSE 189 (H) 11/07/2014 1001   BUN 18 08/05/2022 1117   BUN 15.8 11/07/2014 1001   CREATININE 1.10 08/05/2022 1117   CREATININE 0.90 (H) 06/21/2020 1402   CREATININE 0.8 11/07/2014 1001   CALCIUM 10.6 (H) 08/05/2022 1117   CALCIUM 9.8 11/07/2014 1001   GFRNONAA 58 (L) 03/22/2020 1359   GFRAA 67 03/22/2020 1359    BNP No results found for: "BNP"   Imaging:  No results found.       Latest Ref Rng & Units 12/19/2020   11:58 AM 09/30/2013   11:41 AM  PFT Results  FVC-Pre L 1.33  1.60   FVC-Predicted Pre % 88  91   FVC-Post L 1.40  1.42   FVC-Predicted Post % 92  80   Pre FEV1/FVC % % 76  85   Post FEV1/FCV % % 76  82   FEV1-Pre L 1.02  1.36   FEV1-Predicted Pre % 87  101   FEV1-Post L 1.07  1.16   DLCO uncorrected ml/min/mmHg 12.57  14.73   DLCO UNC% % 75  72   DLCO corrected ml/min/mmHg 12.57    DLCO COR %Predicted % 75    DLVA Predicted % 95  133   TLC L  4.31   TLC % Predicted %  93   RV % Predicted %  124     No results found for: "NITRICOXIDE"     Assessment & Plan:   No problem-specific Assessment & Plan notes found for this encounter.     Noemi Chapel, NP 11/25/2022  Pt aware and understands NP's role.

## 2022-11-25 NOTE — Patient Instructions (Signed)
Continue protonix 40 mg daily for reflux   Try to slowly increase your activity level to goal of 30 minutes of exercise a day   Follow up in 6 months with Dr. Alva. If symptoms worsen, please contact office for sooner follow up or seek emergency care.  

## 2022-11-27 ENCOUNTER — Encounter: Payer: Self-pay | Admitting: Nurse Practitioner

## 2022-11-27 NOTE — Assessment & Plan Note (Signed)
IPF with progressive phenotype. She was intolerant of Esbriet. Declined to start Ofev. CT from June 2023 was stable when compared to year prior. Currently with minimal symptom burden aside from fatigue, which is likely multifactorial. Encouraged her to continue working on graded exercises and increased activity. She would prefer to not repeat PFTs or imaging unless she has a drastic decline, which I agree with given her age, frailty and history. Walking oximetry today without desaturations.   Patient Instructions  Continue protonix 40 mg daily for reflux    Try to slowly increase your activity level to goal of 30 minutes of exercise a day    Follow up in 6 months with Dr. Vassie Loll. If symptoms worsen, please contact office for sooner follow up or seek emergency care.

## 2022-11-27 NOTE — Assessment & Plan Note (Addendum)
Well controlled on PPI. No change to current regimen.

## 2022-12-10 ENCOUNTER — Other Ambulatory Visit: Payer: Self-pay | Admitting: Family Medicine

## 2022-12-31 ENCOUNTER — Ambulatory Visit (INDEPENDENT_AMBULATORY_CARE_PROVIDER_SITE_OTHER): Payer: Medicare (Managed Care) | Admitting: Family Medicine

## 2022-12-31 ENCOUNTER — Encounter: Payer: Self-pay | Admitting: Family Medicine

## 2022-12-31 VITALS — BP 136/70 | HR 54 | Temp 98.4°F | Ht 60.0 in | Wt 127.4 lb

## 2022-12-31 DIAGNOSIS — R21 Rash and other nonspecific skin eruption: Secondary | ICD-10-CM

## 2022-12-31 DIAGNOSIS — E1159 Type 2 diabetes mellitus with other circulatory complications: Secondary | ICD-10-CM

## 2022-12-31 DIAGNOSIS — T63421A Toxic effect of venom of ants, accidental (unintentional), initial encounter: Secondary | ICD-10-CM | POA: Diagnosis not present

## 2022-12-31 DIAGNOSIS — I152 Hypertension secondary to endocrine disorders: Secondary | ICD-10-CM | POA: Diagnosis not present

## 2022-12-31 MED ORDER — TRIAMCINOLONE ACETONIDE 0.5 % EX OINT: 1.0000 | TOPICAL_OINTMENT | Freq: Two times a day (BID) | CUTANEOUS | 0 refills | Status: DC

## 2022-12-31 NOTE — Assessment & Plan Note (Addendum)
Elevated today initially however at goal on recheck..  She has typically been well-controlled.  They will monitor at home and let us know if persistently elevated.

## 2022-12-31 NOTE — Progress Notes (Signed)
   Tammy Hughes is a 89 y.o. female who presents today for an office visit.  Assessment/Plan:  New/Acute Problems: Rash Secondary to fire ant bites.  No signs of infection.  Will start topical triamcinolone.  She will let us know if not improving.  Chronic Problems Addressed Today: Hypertension associated with diabetes (HCC) Elevated today initially however at goal on recheck..  She has typically been well-controlled.  They will monitor at home and let us know if persistently elevated.    Subjective:  HPI:  See A/P for status of chronic conditions.  Patient is here today with pain and redness on foot.  Started 2 days ago.  She was leaving church when she does fire ants to the area.  She thinks she got bit and stung multiple times.  Had a lot of burning and redness to the area.  She is now developing some blisters.  She tried several over-the-counter ointments without any improvement.        Objective:  Physical Exam: BP 136/70   Pulse (!) 54   Temp 98.4 F (36.9 C) (Temporal)   Ht 5' (1.524 m)   Wt 127 lb 6.4 oz (57.8 kg)   SpO2 97%   BMI 24.88 kg/m   Gen: No acute distress, resting comfortably Skin: Several scattered pustules on the left foot consistent with fire ant bites Neuro: Grossly normal, moves all extremities Psych: Normal affect and thought content      Tammy Hughes M. Jimmey Ralph, MD 12/31/2022 11:58 AM

## 2022-12-31 NOTE — Patient Instructions (Signed)
It was very nice to see you today!  Please start the triamcinolone for the rash.  Return if symptoms worsen or fail to improve.   Take care, Dr Jimmey Ralph  PLEASE NOTE:  If you had any lab tests, please let us know if you have not heard back within a few days. You may see your results on mychart before we have a chance to review them but we will give you a call once they are reviewed by [place].   If we ordered any referrals today, please let us know if you have not heard from their office within the next week.   If you had any urgent prescriptions sent in today, please check with the pharmacy within an hour of our visit to make sure the prescription was transmitted appropriately.   Please try these tips to maintain a healthy lifestyle:  Eat at least 3 REAL meals and 1-2 snacks per day.  Aim for no more than 5 hours between eating.  If you eat breakfast, please do so within one hour of getting up.   Each meal should contain half fruits/vegetables, one quarter protein, and one quarter carbs (no bigger than a computer mouse)  Cut down on sweet beverages. This includes juice, soda, and sweet tea.   Drink at least 1 glass of water with each meal and aim for at least 8 glasses per day  Exercise at least 150 minutes every week.

## 2023-01-10 ENCOUNTER — Ambulatory Visit (INDEPENDENT_AMBULATORY_CARE_PROVIDER_SITE_OTHER): Payer: Medicare (Managed Care) | Admitting: "Endocrinology

## 2023-01-10 ENCOUNTER — Encounter: Payer: Self-pay | Admitting: "Endocrinology

## 2023-01-10 VITALS — BP 150/70 | HR 66 | Ht 60.0 in | Wt 128.2 lb

## 2023-01-10 DIAGNOSIS — E1165 Type 2 diabetes mellitus with hyperglycemia: Secondary | ICD-10-CM

## 2023-01-10 DIAGNOSIS — Z794 Long term (current) use of insulin: Secondary | ICD-10-CM | POA: Diagnosis not present

## 2023-01-10 NOTE — Patient Instructions (Addendum)
NovoLog Mix 70/30 insulin: 16 units a.m. and 10 units before dinner-15 min before meals    _____________________    Goals of DM therapy:  Morning Fasting blood sugar: 80-140  Blood sugar before meals: 80-140 Bed time blood sugar: 100-150  A1C <7%, limited only by hypoglycemia  1.Diabetes medications and their side effects discussed, including hypoglycemia    2. Check blood glucose:  a) Always check blood sugars before driving. Please see below (under hypoglycemia) on how to manage b) Check a minimum of 3 times/day or more as needed when having symptoms of hypoglycemia.   c) Try to check blood glucose before sleeping/in the middle of the night to ensure that it is remaining stable and not dropping less than 100 d) Check blood glucose more often if sick  3. Diet: a) 3 meals per day schedule b: Restrict carbs to 60-70 grams (4 servings) per meal c) Colorful vegetables - 3 servings a day, and low sugar fruit 2 servings/day Plate control method: 1/4 plate protein, 1/4 starch, 1/2 green, yellow, or red vegetables d) Avoid carbohydrate snacks unless hypoglycemic episode, or increased physical activity  4. Regular exercise as tolerated, preferably 3 or more hours a week  5. Hypoglycemia: a)  Do not drive or operate machinery without first testing blood glucose to assure it is over 90 mg%, or if dizzy, lightheaded, not feeling normal, etc, or  if foot or leg is numb or weak. b)  If blood glucose less than 70, take four 5gm Glucose tabs or 15-30 gm Glucose gel.  Repeat every 15 min as needed until blood sugar is >100 mg/dl. If hypoglycemia persists then call 911.   6. Sick day management: a) Check blood glucose more often b) Continue usual therapy if blood sugars are elevated.   7. Contact the doctor immediately if blood glucose is frequently <60 mg/dl, or an episode of severe hypoglycemia occurs (where someone had to give you glucose/  glucagon or if you passed out from a low blood  glucose), or if blood glucose is persistently >350 mg/dl, for further management  8. A change in level of physical activity or exercise and a change in diet may also affect your blood sugar. Check blood sugars more often and call if needed.  Instructions: 1. Bring glucose meter, blood glucose records on every visit for review 2. Continue to follow up with primary care physician and other providers for medical care 3. Yearly eye  and foot exam 4. Please get blood work done prior to the next appointment

## 2023-01-10 NOTE — Progress Notes (Signed)
Outpatient Endocrinology Note Tammy [place], MD  01/10/23   Tammy Hughes September 27, 1932 [id number]  Referring Provider: Ardith Dark, MD Primary Care Provider: Ardith Dark, MD Reason for consultation: Subjective   Assessment & Plan  Diagnoses and all orders for this visit:  Uncontrolled type 2 diabetes mellitus with hyperglycemia, with long-term current use of insulin (HCC) -     Microalbumin / creatinine urine ratio; Future -     Lipid panel; Future -     Hemoglobin A1c; Future -     Comprehensive metabolic panel; Future  Long-term insulin use (HCC)    Diabetes Type II complicated by nephropathy  Lab Results  Component Value Date   GFR 45.24 (L) 08/05/2022   Hba1c goal less than 7, current Hba1c is  Lab Results  Component Value Date   HGBA1C 7.7 (H) 10/15/2022   Will recommend the following: NovoLog Mix 70/30 insulin: 16 units a.m. and 10 units before dinner-15 min before meals  Fasting labs before next visit  No known contraindications to any of above medications  -Last LD and Tg are as follows: Lab Results  Component Value Date   LDLCALC 60 02/25/2022    Lab Results  Component Value Date   TRIG 115.0 02/25/2022   -Recommend rosuvastatin 5 mg QD -Follow low fat diet and exercise   -Blood pressure goal <140/90 - Microalbumin/creatinine goal < 30 -Last MA/Cr is as follows: Lab Results  Component Value Date   MICROALBUR 15.3 (H) 02/25/2022   -not on ACE/ARB  -diet changes including salt restriction -limit eating outside -counseled BP targets per standards of diabetes care -uncontrolled blood pressure can lead to retinopathy, nephropathy and cardiovascular and atherosclerotic heart disease  Reviewed and counseled on: -A1C target -Blood sugar targets -Complications of uncontrolled diabetes  -Checking blood sugar before meals and bedtime and bring log next visit -All medications with mechanism of action and side effects -Hypoglycemia  management: rule of 15's, Glucagon Emergency Kit and medical alert ID -low-carb low-fat plate-method diet -At least 20 minutes of physical activity per day -Annual dilated retinal eye exam and foot exam -compliance and follow up needs -follow up as scheduled or earlier if problem gets worse  Call if blood sugar is less than 70 or consistently above 250    Take a 15 gm snack of carbohydrate at bedtime before you go to sleep if your blood sugar is less than 100.    If you are going to fast after midnight for a test or procedure, ask your physician for instructions on how to reduce/decrease your insulin dose.    Call if blood sugar is less than 70 or consistently above 250  -Treating a low sugar by rule of 15  (15 gms of sugar every 15 min until sugar is more than 70) If you feel your sugar is low, test your sugar to be sure If your sugar is low (less than 70), then take 15 grams of a fast acting Carbohydrate (3-4 glucose tablets or glucose gel or 4 ounces of juice or regular soda) Recheck your sugar 15 min after treating low to make sure it is more than 70 If sugar is still less than 70, treat again with 15 grams of carbohydrate          Don't drive the hour of hypoglycemia  If unconscious/unable to eat or drink by mouth, use glucagon injection or nasal spray baqsimi and call 911. Can repeat again in 15 min if still  unconscious.  Return in about 3 months (around 04/15/2023) for visit and 8 am labs before next visit.   I have reviewed current medications, nurse's notes, allergies, vital signs, past medical and surgical history, family medical history, and social history for this encounter. Counseled patient on symptoms, examination findings, lab findings, imaging results, treatment decisions and monitoring and prognosis. The patient understood the recommendations and agrees with the treatment plan. All questions regarding treatment plan were fully answered.  Tammy Bryce, MD   01/10/23    History of Present Illness Tammy Hughes is a 89 y.o. year old female who presents for evaluation of Type II diabetes mellitus.  Tammy Hughes was first diagnosed in 1995.   Diabetes education +  Diagnosis date: 1995  Current regimen: NovoLog Mix 70/30 15 units a.m. and 10 units before dinner  Previous history:  Non-insulin hypoglycemic drugs previously used:?  Metformin, detailed history not available Insulin was started in 2005 Appears to have been on premixed insulin for several years She is appears to have relatively stable control except since about early 2022  COMPLICATIONS -  MI/Stroke -  retinopathy -  neuropathy  + nephropathy  BLOOD SUGAR DATA  CGM interpretation: At today's visit, we reviewed her CGM downloads. The full report is scanned in the media. Reviewing the CGM trends, BG are elevated through the day but well controlled overnight   Physical Exam  BP (!) 150/70   Pulse 66   Ht 5' (1.524 m)   Wt 128 lb 3.2 oz (58.2 kg)   SpO2 94%   BMI 25.04 kg/m    Constitutional: well developed, well nourished Head: normocephalic, atraumatic Eyes: sclera anicteric, no redness Neck: supple Lungs: normal respiratory effort Neurology: alert and oriented Skin: dry, no appreciable rashes Musculoskeletal: no appreciable defects Psychiatric: normal mood and affect Diabetic Foot Exam - Simple   No data filed      Current Medications Patient's Medications  New Prescriptions   No medications on file  Previous Medications   ACETAMINOPHEN (TYLENOL PO)    Take 500 mg by mouth 2 (two) times daily.    CALCIUM CARBONATE (TUMS - DOSED IN MG ELEMENTAL CALCIUM) 500 MG CHEWABLE TABLET    Chew 1 tablet by mouth daily. As needed   CLOPIDOGREL (PLAVIX) 75 MG TABLET    TAKE 1 TABLET BY MOUTH ONCE DAILY   CONTINUOUS BLOOD GLUC SENSOR (FREESTYLE LIBRE 2 SENSOR) MISC    CHANGE EVERY 14 DAYS   CONTINUOUS GLUCOSE RECEIVER (FREESTYLE LIBRE 2 READER) DEVI    USE AS  INSTRUCTED TO CHECK BLOOD SUGARS   DONEPEZIL (ARICEPT) 10 MG TABLET    TAKE 1 TABLET BY MOUTH ONCE DAILY   ESCITALOPRAM (LEXAPRO) 10 MG TABLET    Take 1 tablet (10 mg total) by mouth daily.   GLUCOSE BLOOD (ONETOUCH VERIO) TEST STRIP    Use as instructed   INSULIN ASPART PROTAMINE - ASPART (NOVOLOG MIX 70/30 FLEXPEN) (70-30) 100 UNIT/ML FLEXPEN    Inject 13 Units into the skin daily with breakfast AND 10 Units daily with supper.   INSULIN PEN NEEDLE 32G X 4 MM MISC    1 Device by Does not apply route in the morning and at bedtime.   MECLIZINE (ANTIVERT) 25 MG TABLET    TAKE 1 TABLET BY MOUTH THREE TIMES DAILY IF NEEDED FOR DIZZINESS   MIRTAZAPINE (REMERON) 15 MG TABLET    TAKE 1 TABLET BY MOUTH AT BEDTIME   PANTOPRAZOLE (PROTONIX) 40 MG TABLET  Take 1 tablet (40 mg total) by mouth daily.   ROSUVASTATIN (CRESTOR) 5 MG TABLET    TAKE 1 TABLET BY MOUTH ONCE DAILY   TRIAMCINOLONE OINTMENT (KENALOG) 0.5 %    Apply 1 Application topically 2 (two) times daily.  Modified Medications   No medications on file  Discontinued Medications   No medications on file    Allergies Allergies  Allergen Reactions   Pirfenidone Diarrhea and Nausea And Vomiting   Aspirin     rash    Past Medical History Past Medical History:  Diagnosis Date   Allergy    SEASONAL   ANXIETY 03/08/2008   Cataract    REMOVED BILATERAL   Depression    DIVERTICULOSIS, COLON 03/08/2008   DVT (deep venous thrombosis) (HCC)    GERD 02/23/2007   HYPERCHOLESTEROLEMIA 02/01/2008   HYPERTENSION 03/08/2008   Insulin dependent diabetes mellitus    OSTEOARTHRITIS 02/23/2007   OSTEOPOROSIS 02/23/2007   PERIPHERAL NEUROPATHY 02/23/2007   STD (sexually transmitted disease)    Stomach cancer (HCC) 07/20/14   invasive adenocarcinoma w/signet rings features   Urinary incontinence    Urolithiasis     Past Surgical History Past Surgical History:  Procedure Laterality Date   ABDOMINAL SURGERY     CARDIAC CATHETERIZATION N/A 02/02/2016    Procedure: Left Heart Cath and Coronary Angiography;  Surgeon: Peter M [place], MD;  Location: [facility] INVASIVE CV LAB;  Service: Cardiovascular;  Laterality: N/A;   CHOLECYSTECTOMY  1995   ESOPHAGOGASTRODUODENOSCOPY  06/08/2002   GASTRECTOMY N/A 07/20/2014   Procedure: PROXIMAL GASTRECTOMY;  Surgeon: Almond Lint, MD;  Location: [facility] OR;  Service: General;  Laterality: N/A;   GASTROJEJUNOSTOMY N/A 07/20/2014   Procedure: GASTROJEJUNOSTOMY;  Surgeon: Almond Lint, MD;  Location: [facility] OR;  Service: General;  Laterality: N/A;   LAPAROSCOPY N/A 07/20/2014   Procedure: LAPAROSCOPY DIAGNOSTIC;  Surgeon: Almond Lint, MD;  Location: [facility] OR;  Service: General;  Laterality: N/A;    Family History family history includes Cancer in her father, sister, and sister; Diabetes in her sister and sister; Other in her mother.  Social History Social History   Socioeconomic History   Marital status: Married    Spouse name: Not on file   Number of children: Not on file   Years of education: Not on file   Highest education level: Not on file  Occupational History   Occupation: Retired    Associate Professor: RETIRED  Tobacco Use   Smoking status: Never   Smokeless tobacco: Never  Vaping Use   Vaping Use: Never used  Substance and Sexual Activity   Alcohol use: No    Alcohol/week: 0.0 standard drinks of alcohol   Drug use: No   Sexual activity: Not Currently    Birth control/protection: Post-menopausal  Other Topics Concern   Not on file  Social History Narrative   Retired-Domestic Assistant   Right handed    Lives with husband    Lives in a one story home   Social Determinants of Health   Financial Resource Strain: Low Risk  (05/24/2022)   Overall Financial Resource Strain (CARDIA)    Difficulty of Paying Living Expenses: Not hard at all  Food Insecurity: No Food Insecurity (05/24/2022)   Hunger Vital Sign    Worried About Running Out of Food in the Last Year: Never true    Ran Out of Food in the Last Year:  Never true  Transportation Needs: No Transportation Needs (05/24/2022)   PRAPARE - Transportation    Lack  of Transportation (Medical): No    Lack of Transportation (Non-Medical): No  Physical Activity: Insufficiently Active (05/24/2022)   Exercise Vital Sign    Days of Exercise per Week: 2 days    Minutes of Exercise per Session: 30 min  Stress: No Stress Concern Present (05/24/2022)   [employer] of Occupational Health - Occupational Stress Questionnaire    Feeling of Stress : Not at all  Social Connections: Moderately Integrated (05/24/2022)   Social Connection and Isolation Panel [NHANES]    Frequency of Communication with Friends and Family: More than three times a week    Frequency of Social Gatherings with Friends and Family: More than three times a week    Attends Religious Services: More than 4 times per year    Active Member of Clubs or Organizations: No    Attends Banker Meetings: Never    Marital Status: Married  Catering manager Violence: Not At Risk (05/24/2022)   Humiliation, Afraid, Rape, and Kick questionnaire    Fear of Current or Ex-Partner: No    Emotionally Abused: No    Physically Abused: No    Sexually Abused: No    Lab Results  Component Value Date   HGBA1C 7.7 (H) 10/15/2022   HGBA1C 8.1 (H) 08/05/2022   HGBA1C 7.3 (A) 05/31/2022   Lab Results  Component Value Date   CHOL 126 02/25/2022   Lab Results  Component Value Date   HDL 42.80 02/25/2022   Lab Results  Component Value Date   LDLCALC 60 02/25/2022   Lab Results  Component Value Date   TRIG 115.0 02/25/2022   Lab Results  Component Value Date   CHOLHDL 3 02/25/2022   Lab Results  Component Value Date   CREATININE 1.10 08/05/2022   Lab Results  Component Value Date   GFR 45.24 (L) 08/05/2022   Lab Results  Component Value Date   MICROALBUR 15.3 (H) 02/25/2022      Component Value Date/Time   NA 140 08/05/2022 1117   NA 139 11/07/2014 1001   K 4.3  08/05/2022 1117   K 4.6 11/07/2014 1001   CL 104 08/05/2022 1117   CO2 29 08/05/2022 1117   CO2 26 11/07/2014 1001   GLUCOSE 168 (H) 10/15/2022 1102   GLUCOSE 189 (H) 11/07/2014 1001   BUN 18 08/05/2022 1117   BUN 15.8 11/07/2014 1001   CREATININE 1.10 08/05/2022 1117   CREATININE 0.90 (H) 06/21/2020 1402   CREATININE 0.8 11/07/2014 1001   CALCIUM 10.6 (H) 08/05/2022 1117   CALCIUM 9.8 11/07/2014 1001   PROT 7.2 02/25/2022 0950   PROT 7.1 11/07/2014 1001   ALBUMIN 3.9 02/25/2022 0950   ALBUMIN 2.8 (L) 11/07/2014 1001   AST 16 02/25/2022 0950   AST 23 11/07/2014 1001   ALT 10 02/25/2022 0950   ALT 15 11/07/2014 1001   ALKPHOS 88 02/25/2022 0950   ALKPHOS 99 11/07/2014 1001   BILITOT 0.3 02/25/2022 0950   BILITOT 0.28 11/07/2014 1001   GFRNONAA 58 (L) 03/22/2020 1359   GFRAA 67 03/22/2020 1359      Latest Ref Rng & Units 10/15/2022   11:02 AM 08/05/2022   11:17 AM 02/25/2022    9:50 AM  BMP  Glucose 70 - 99 mg/dL 409  811  914   BUN 6 - 23 mg/dL  18  21   Creatinine 7.82 - 1.20 mg/dL  9.56  2.13   Sodium 086 - 145 mEq/L  140  141  Potassium 3.5 - 5.1 mEq/L  4.3  3.9   Chloride 96 - 112 mEq/L  104  106   CO2 19 - 32 mEq/L  29  27   Calcium 8.4 - 10.5 mg/dL  16.1  09.6        Component Value Date/Time   WBC 5.3 09/07/2020 1207   RBC 4.00 09/07/2020 1207   HGB 12.0 09/07/2020 1207   HGB 11.0 (L) 11/07/2014 1001   HCT 36.2 09/07/2020 1207   HCT 34.3 (L) 11/07/2014 1001   PLT 271.0 09/07/2020 1207   PLT 242 11/07/2014 1001   MCV 90.5 09/07/2020 1207   MCV 89.6 11/07/2014 1001   MCH 29.8 06/21/2020 1402   MCHC 33.2 09/07/2020 1207   RDW 13.3 09/07/2020 1207   RDW 21.5 (H) 11/07/2014 1001   LYMPHSABS 2.0 09/07/2020 1207   LYMPHSABS 0.4 (L) 11/07/2014 1001   MONOABS 0.5 09/07/2020 1207   MONOABS 0.8 11/07/2014 1001   EOSABS 0.2 09/07/2020 1207   EOSABS 0.2 11/07/2014 1001   BASOSABS 0.0 09/07/2020 1207   BASOSABS 0.0 11/07/2014 1001     Parts of this note may  have been dictated using voice recognition software. There may be variances in spelling and vocabulary which are unintentional. Not all errors are proofread. Please notify the Thereasa Parkin if any discrepancies are noted or if the meaning of any statement is not clear.

## 2023-01-29 ENCOUNTER — Ambulatory Visit (HOSPITAL_COMMUNITY): Payer: Medicare (Managed Care) | Attending: Internal Medicine

## 2023-01-29 DIAGNOSIS — R011 Cardiac murmur, unspecified: Secondary | ICD-10-CM | POA: Diagnosis present

## 2023-01-29 LAB — ECHOCARDIOGRAM COMPLETE
Area-P 1/2: 3.65 cm2
Est EF: 55
S' Lateral: 2.6 cm

## 2023-02-04 ENCOUNTER — Ambulatory Visit: Payer: Medicare (Managed Care) | Admitting: Family Medicine

## 2023-02-04 ENCOUNTER — Encounter: Payer: Self-pay | Admitting: Family Medicine

## 2023-02-04 VITALS — BP 160/69 | HR 56 | Temp 97.8°F | Ht 60.0 in | Wt 129.0 lb

## 2023-02-04 DIAGNOSIS — E1169 Type 2 diabetes mellitus with other specified complication: Secondary | ICD-10-CM | POA: Diagnosis not present

## 2023-02-04 DIAGNOSIS — F339 Major depressive disorder, recurrent, unspecified: Secondary | ICD-10-CM | POA: Insufficient documentation

## 2023-02-04 DIAGNOSIS — M1990 Unspecified osteoarthritis, unspecified site: Secondary | ICD-10-CM | POA: Diagnosis not present

## 2023-02-04 DIAGNOSIS — I152 Hypertension secondary to endocrine disorders: Secondary | ICD-10-CM

## 2023-02-04 DIAGNOSIS — E1142 Type 2 diabetes mellitus with diabetic polyneuropathy: Secondary | ICD-10-CM

## 2023-02-04 DIAGNOSIS — E1159 Type 2 diabetes mellitus with other circulatory complications: Secondary | ICD-10-CM

## 2023-02-04 DIAGNOSIS — E785 Hyperlipidemia, unspecified: Secondary | ICD-10-CM | POA: Diagnosis not present

## 2023-02-04 DIAGNOSIS — F325 Major depressive disorder, single episode, in full remission: Secondary | ICD-10-CM

## 2023-02-04 DIAGNOSIS — G301 Alzheimer's disease with late onset: Secondary | ICD-10-CM

## 2023-02-04 DIAGNOSIS — F02A0 Dementia in other diseases classified elsewhere, mild, without behavioral disturbance, psychotic disturbance, mood disturbance, and anxiety: Secondary | ICD-10-CM

## 2023-02-04 NOTE — Assessment & Plan Note (Signed)
Elevated today.  Typically at goal.  Not currently on any medications. She does have some whitecoat hypertension.  They will monitor at home and let us know if persistently elevated.

## 2023-02-04 NOTE — Assessment & Plan Note (Signed)
Stable on Lexapro 10 mg daily and Remeron 15 mg daily.

## 2023-02-04 NOTE — Patient Instructions (Signed)
It was very nice to see you today!  I am glad that you are doing well.  No changes today.  Please continue to work on diet and exercise.  Return in about 6 months (around 08/07/2023) for Annual Physical.   Take care, Dr Jimmey Ralph  PLEASE NOTE:  If you had any lab tests, please let us know if you have not heard back within a few days. You may see your results on mychart before we have a chance to review them but we will give you a call once they are reviewed by [place].   If we ordered any referrals today, please let us know if you have not heard from their office within the next week.   If you had any urgent prescriptions sent in today, please check with the pharmacy within an hour of our visit to make sure the prescription was transmitted appropriately.   Please try these tips to maintain a healthy lifestyle:  Eat at least 3 REAL meals and 1-2 snacks per day.  Aim for no more than 5 hours between eating.  If you eat breakfast, please do so within one hour of getting up.   Each meal should contain half fruits/vegetables, one quarter protein, and one quarter carbs (no bigger than a computer mouse)  Cut down on sweet beverages. This includes juice, soda, and sweet tea.   Drink at least 1 glass of water with each meal and aim for at least 8 glasses per day  Exercise at least 150 minutes every week.

## 2023-02-04 NOTE — Assessment & Plan Note (Signed)
On Crestor 5 mg daily.  Will be getting lipids checked again in a few months with endocrinology.

## 2023-02-04 NOTE — Assessment & Plan Note (Signed)
Following with endocrinology.  Currently on 70/30 13 units in the morning and 10 units in the evening.  She has follow-up with endocrinology in a few months.

## 2023-02-04 NOTE — Assessment & Plan Note (Signed)
Following with neurology for this.  Symptoms are stable.  She is currently on Aricept 10 mg daily, Remeron 15 mg daily, and Lexapro 10 mg daily.

## 2023-02-04 NOTE — Assessment & Plan Note (Signed)
No red flag.  Still having quite an issue with pain.  She has been using Tylenol with modest improvement.  Discussed that she can take 1000 mg 2-3 times daily as needed.  She does have a history of allergy to aspirin but has tolerated ibuprofen well in the past.  She prefers to take Tylenol.  We did discuss referral however declined for now.  Recheck again in 6 months.

## 2023-02-04 NOTE — Progress Notes (Signed)
   MARLYN TONDREAU is a 89 y.o. female who presents today for an office visit.  Assessment/Plan:  Chronic Problems Addressed Today: Type 2 diabetes mellitus with peripheral neuropathy [facility]) Following with endocrinology.  Currently on 70/30 13 units in the morning and 10 units in the evening.  She has follow-up with endocrinology in a few months.  Osteoarthritis No red flag.  Still having quite an issue with pain.  She has been using Tylenol with modest improvement.  Discussed that she can take 1000 mg 2-3 times daily as needed.  She does have a history of allergy to aspirin but has tolerated ibuprofen well in the past.  She prefers to take Tylenol.  We did discuss referral however declined for now.  Recheck again in 6 months.  Dyslipidemia associated with type 2 diabetes mellitus (HCC) On Crestor 5 mg daily.  Will be getting lipids checked again in a few months with endocrinology.  Mild dementia [facility]) Following with neurology for this.  Symptoms are stable.  She is currently on Aricept 10 mg daily, Remeron 15 mg daily, and Lexapro 10 mg daily.  Depression, major, in remission (HCC) Stable on Lexapro 10 mg daily and Remeron 15 mg daily.  Hypertension associated with diabetes (HCC) Elevated today.  Typically at goal.  Not currently on any medications. She does have some whitecoat hypertension.  They will monitor at home and let us know if persistently elevated.     Subjective:  HPI:  See Assessment / plan for status of chronic conditions.   She has no acute concerns today.  She was last seen here about a month or so ago for fire ant bites.  She started topical triamcinolone.  Symptoms improved.  She has seen endocrinology since her last visit.  She is also work with neurology.  She feels like her medications are working well.  No significant side effects.       Objective:  Physical Exam: BP (!) 160/69   Pulse (!) 56   Temp 97.8 F (36.6 C) (Temporal)   Ht 5' (1.524 m)   Wt 129  lb (58.5 kg)   SpO2 98%   BMI 25.19 kg/m   Gen: No acute distress, resting comfortably CV: Regular rate and rhythm with no murmurs appreciated Pulm: Normal work of breathing, clear to auscultation bilaterally with no crackles, wheezes, or rhonchi Neuro: Grossly normal, moves all extremities Psych: Normal affect and thought content      Manasvi Dickard M. Jimmey Ralph, MD 02/04/2023 11:38 AM

## 2023-02-13 ENCOUNTER — Ambulatory Visit (INDEPENDENT_AMBULATORY_CARE_PROVIDER_SITE_OTHER): Payer: Medicare (Managed Care) | Admitting: Family

## 2023-02-13 VITALS — BP 156/74 | HR 53 | Temp 97.7°F | Ht 60.0 in | Wt 127.5 lb

## 2023-02-13 DIAGNOSIS — J029 Acute pharyngitis, unspecified: Secondary | ICD-10-CM

## 2023-02-13 DIAGNOSIS — M25551 Pain in right hip: Secondary | ICD-10-CM | POA: Diagnosis not present

## 2023-02-13 NOTE — Patient Instructions (Signed)
It was very nice to see you today!   You can continue to take Tylenol as needed for your hip. I also recommend applying ice up to 3 times per day for 30 minutes. Try over the counter Diclofenac sodium gel, apply 3-4 times per day to right hip.  Try warm salt water gargles several times per day for your sore throat. OK to keep taking Tylenol for pain and drinking hot tea. Let us know if not improving by next week or if you develop any fever.      PLEASE NOTE:  If you had any lab tests please let us know if you have not heard back within a few days. You may see your results on MyChart before we have a chance to review them but we will give you a call once they are reviewed by [place]. If we ordered any referrals today, please let us know if you have not heard from their office within the next week.

## 2023-02-13 NOTE — Progress Notes (Signed)
Patient ID: Tammy Hughes, female    DOB: 12-17-1932, 89 y.o.   MRN: [id number]  Chief Complaint  Patient presents with   Sore Throat    Pt c/o sore throat, Present for 2 days. Has tried tylenol and hot tea.    Hip Pain    Pt c/o right hip pain, Pt had a fall 2 weeks ago after tripping over a dog leash. Has tried tylenol which does help pain slightly.     HPI:      Sore throat:  clearing throat often, sometimes difficulty swallowing, runny nose, no fever, no cough.   Right hip pain:  fell 2 weeks ago, had help getting up, said pain started the next morning, no bruising noted, reports having arthritis in past, has taken Tylenol for it.  Pt reports the pain has worsened in last 1-2 days.     Assessment & Plan:  1. Sore throat pharynx wnl, no erythema or swelling noted, advised ok to continue Tylenol prn, warm salt water gargles, hot tea with honey. Call back if sx are not improving.   2. Right hip pain no decrease in ROM, pain with palpation on lateral hip, possible bursitis. Advised on applying ice up to 30 min tid, continue taking Tylenol, try to walk as able daily for continued circulation. Call back if sx are not improving, can refer to PT and/or Ortho.   Subjective:    Outpatient Medications Prior to Visit  Medication Sig Dispense Refill   Acetaminophen (TYLENOL PO) Take 500 mg by mouth 2 (two) times daily.      calcium carbonate (TUMS - DOSED IN MG ELEMENTAL CALCIUM) 500 MG chewable tablet Chew 1 tablet by mouth daily. As needed     clopidogrel (PLAVIX) 75 MG tablet TAKE 1 TABLET BY MOUTH ONCE DAILY 90 tablet 0   Continuous Blood Gluc Sensor (FREESTYLE LIBRE 2 SENSOR) MISC CHANGE EVERY 14 DAYS 6 each 6   Continuous Glucose Receiver (FREESTYLE LIBRE 2 READER) DEVI USE AS INSTRUCTED TO CHECK BLOOD SUGARS 6 each 0   donepezil (ARICEPT) 10 MG tablet TAKE 1 TABLET BY MOUTH ONCE DAILY 90 tablet 0   escitalopram (LEXAPRO) 10 MG tablet Take 1 tablet (10 mg total) by mouth daily. 30  tablet 3   glucose blood (ONETOUCH VERIO) test strip Use as instructed 200 strip 0   insulin aspart protamine - aspart (NOVOLOG MIX 70/30 FLEXPEN) (70-30) 100 UNIT/ML FlexPen Inject 13 Units into the skin daily with breakfast AND 10 Units daily with supper. 30 mL 3   Insulin Pen Needle 32G X 4 MM MISC 1 Device by Does not apply route in the morning and at bedtime. 200 each 3   meclizine (ANTIVERT) 25 MG tablet TAKE 1 TABLET BY MOUTH THREE TIMES DAILY IF NEEDED FOR DIZZINESS 30 tablet 0   mirtazapine (REMERON) 15 MG tablet TAKE 1 TABLET BY MOUTH AT BEDTIME 90 tablet 0   pantoprazole (PROTONIX) 40 MG tablet Take 1 tablet (40 mg total) by mouth daily. 90 tablet 3   rosuvastatin (CRESTOR) 5 MG tablet TAKE 1 TABLET BY MOUTH ONCE DAILY 90 tablet 0   triamcinolone ointment (KENALOG) 0.5 % Apply 1 Application topically 2 (two) times daily. 30 g 0   No facility-administered medications prior to visit.   Past Medical History:  Diagnosis Date   Allergy    SEASONAL   ANXIETY 03/08/2008   Cataract    REMOVED BILATERAL   Depression    DIVERTICULOSIS, COLON 03/08/2008  DVT (deep venous thrombosis) (HCC)    GERD 02/23/2007   HYPERCHOLESTEROLEMIA 02/01/2008   HYPERTENSION 03/08/2008   Insulin dependent diabetes mellitus    OSTEOARTHRITIS 02/23/2007   OSTEOPOROSIS 02/23/2007   PERIPHERAL NEUROPATHY 02/23/2007   STD (sexually transmitted disease)    Stomach cancer (HCC) 07/20/14   invasive adenocarcinoma w/signet rings features   Urinary incontinence    Urolithiasis    Past Surgical History:  Procedure Laterality Date   ABDOMINAL SURGERY     CARDIAC CATHETERIZATION N/A 02/02/2016   Procedure: Left Heart Cath and Coronary Angiography;  Surgeon: Peter M Sw[place], MD;  Location: [facility] INVASIVE CV LAB;  Service: Cardiovascular;  Laterality: N/A;   CHOLECYSTECTOMY  1995   ESOPHAGOGASTRODUODENOSCOPY  06/08/2002   GASTRECTOMY N/A 07/20/2014   Procedure: PROXIMAL GASTRECTOMY;  Surgeon: Almond Lint, MD;  Location: [facility]  OR;  Service: General;  Laterality: N/A;   GASTROJEJUNOSTOMY N/A 07/20/2014   Procedure: GASTROJEJUNOSTOMY;  Surgeon: Almond Lint, MD;  Location: [facility] OR;  Service: General;  Laterality: N/A;   LAPAROSCOPY N/A 07/20/2014   Procedure: LAPAROSCOPY DIAGNOSTIC;  Surgeon: Almond Lint, MD;  Location: [facility] OR;  Service: General;  Laterality: N/A;   Allergies  Allergen Reactions   Pirfenidone Diarrhea and Nausea And Vomiting   Aspirin     rash      Objective:    Physical Exam Vitals and nursing note reviewed.  Constitutional:      Appearance: Normal appearance.  HENT:     Right Ear: Tympanic membrane and ear canal normal.     Left Ear: Tympanic membrane and ear canal normal.     Nose:     Right Sinus: No frontal sinus tenderness.     Left Sinus: No frontal sinus tenderness.     Mouth/Throat:     Mouth: Mucous membranes are moist.     Pharynx: No pharyngeal swelling, oropharyngeal exudate, posterior oropharyngeal erythema or uvula swelling.     Tonsils: No tonsillar exudate or tonsillar abscesses.  Cardiovascular:     Rate and Rhythm: Normal rate and regular rhythm.  Pulmonary:     Effort: Pulmonary effort is normal.     Breath sounds: Normal breath sounds.  Musculoskeletal:        General: Normal range of motion.     Right hip: Bony tenderness present. No tenderness. Normal range of motion.  Lymphadenopathy:     Head:     Right side of head: No submandibular, tonsillar, preauricular, posterior auricular or occipital adenopathy.     Left side of head: No submandibular, tonsillar, preauricular, posterior auricular or occipital adenopathy.     Cervical: No cervical adenopathy.  Skin:    General: Skin is warm and dry.  Neurological:     Mental Status: She is alert.  Psychiatric:        Mood and Affect: Mood normal.        Behavior: Behavior normal.    BP (!) 156/74 (BP Location: Left Arm, Patient Position: Sitting, Cuff Size: Normal)   Pulse (!) 53   Temp 97.7 F (36.5 C)  (Temporal)   Ht 5' (1.524 m)   Wt 127 lb 8 oz (57.8 kg)   SpO2 96%   BMI 24.90 kg/m  Wt Readings from Last 3 Encounters:  02/13/23 127 lb 8 oz (57.8 kg)  02/04/23 129 lb (58.5 kg)  01/10/23 128 lb 3.2 oz (58.2 kg)      Dulce Sellar, NP

## 2023-03-24 ENCOUNTER — Encounter: Payer: Self-pay | Admitting: "Endocrinology

## 2023-03-26 ENCOUNTER — Other Ambulatory Visit: Payer: Self-pay

## 2023-03-26 ENCOUNTER — Telehealth: Payer: Self-pay | Admitting: "Endocrinology

## 2023-03-26 DIAGNOSIS — E1165 Type 2 diabetes mellitus with hyperglycemia: Secondary | ICD-10-CM

## 2023-03-26 MED ORDER — FREESTYLE LIBRE 2 SENSOR MISC
6 refills | Status: DC
Start: 2023-03-26 — End: 2023-03-26

## 2023-03-26 MED ORDER — FREESTYLE LIBRE 2 SENSOR MISC
3 refills | Status: DC
Start: 2023-03-26 — End: 2023-04-10

## 2023-03-26 NOTE — Telephone Encounter (Signed)
MEDICATION: Free Style Libre Sensor  PHARMACY:  [facility] at [address]  HAS THE PATIENT CONTACTED THEIR PHARMACY?  Yes (see MyChart message from 03-24-23)  IS THIS A 90 DAY SUPPLY : NO  IS PATIENT OUT OF MEDICATION: YES  IF NOT; HOW MUCH IS LEFT:   LAST APPOINTMENT DATE: @6 /28/2024  NEXT APPOINTMENT DATE:@9 /30/2024  DO WE HAVE YOUR PERMISSION TO LEAVE A DETAILED MESSAGE?: YES  OTHER COMMENTS:    **Let patient know to contact pharmacy at the end of the day to make sure medication is ready. **  ** Please notify patient to allow 48-72 hours to process**  **Encourage patient to contact the pharmacy for refills or they can request refills through [facility]**

## 2023-03-26 NOTE — Telephone Encounter (Signed)
done

## 2023-03-27 LAB — HM DIABETES EYE EXAM

## 2023-04-07 ENCOUNTER — Other Ambulatory Visit: Payer: Self-pay | Admitting: Physician Assistant

## 2023-04-07 ENCOUNTER — Other Ambulatory Visit: Payer: Self-pay | Admitting: Family Medicine

## 2023-04-10 ENCOUNTER — Other Ambulatory Visit: Payer: Self-pay

## 2023-04-10 ENCOUNTER — Other Ambulatory Visit: Payer: Self-pay | Admitting: Physician Assistant

## 2023-04-10 ENCOUNTER — Emergency Department (HOSPITAL_COMMUNITY)
Admission: EM | Admit: 2023-04-10 | Discharge: 2023-04-10 | Disposition: A | Discharge status: Home / Self Care | Payer: Medicare (Managed Care) | Attending: Emergency Medicine | Admitting: Emergency Medicine

## 2023-04-10 ENCOUNTER — Encounter (HOSPITAL_COMMUNITY): Payer: Self-pay

## 2023-04-10 ENCOUNTER — Other Ambulatory Visit: Payer: Self-pay | Admitting: Family Medicine

## 2023-04-10 ENCOUNTER — Emergency Department (HOSPITAL_COMMUNITY): Payer: Medicare (Managed Care)

## 2023-04-10 DIAGNOSIS — R079 Chest pain, unspecified: Secondary | ICD-10-CM | POA: Insufficient documentation

## 2023-04-10 DIAGNOSIS — Z1152 Encounter for screening for COVID-19: Secondary | ICD-10-CM | POA: Diagnosis not present

## 2023-04-10 DIAGNOSIS — R7989 Other specified abnormal findings of blood chemistry: Secondary | ICD-10-CM | POA: Diagnosis not present

## 2023-04-10 DIAGNOSIS — Z794 Long term (current) use of insulin: Secondary | ICD-10-CM | POA: Insufficient documentation

## 2023-04-10 DIAGNOSIS — Z7902 Long term (current) use of antithrombotics/antiplatelets: Secondary | ICD-10-CM | POA: Diagnosis not present

## 2023-04-10 LAB — CBC WITH DIFFERENTIAL/PLATELET
Abs Immature Granulocytes: 0.02 10*3/uL (ref 0.00–0.07)
Basophils Absolute: 0 10*3/uL (ref 0.0–0.1)
Basophils Relative: 1 %
Eosinophils Absolute: 0.1 10*3/uL (ref 0.0–0.5)
Eosinophils Relative: 2 %
HCT: 34.3 % — ABNORMAL LOW (ref 36.0–46.0)
Hemoglobin: 11.1 g/dL — ABNORMAL LOW (ref 12.0–15.0)
Immature Granulocytes: 0 %
Lymphocytes Relative: 24 %
Lymphs Abs: 1.3 10*3/uL (ref 0.7–4.0)
MCH: 30.7 pg (ref 26.0–34.0)
MCHC: 32.4 g/dL (ref 30.0–36.0)
MCV: 94.8 fL (ref 80.0–100.0)
Monocytes Absolute: 0.4 10*3/uL (ref 0.1–1.0)
Monocytes Relative: 7 %
Neutro Abs: 3.6 10*3/uL (ref 1.7–7.7)
Neutrophils Relative %: 66 %
Platelets: 240 10*3/uL (ref 150–400)
RBC: 3.62 MIL/uL — ABNORMAL LOW (ref 3.87–5.11)
RDW: 12.2 % (ref 11.5–15.5)
WBC: 5.6 10*3/uL (ref 4.0–10.5)
nRBC: 0 % (ref 0.0–0.2)

## 2023-04-10 LAB — COMPREHENSIVE METABOLIC PANEL
ALT: 17 U/L (ref 0–44)
AST: 22 U/L (ref 15–41)
Albumin: 3.1 g/dL — ABNORMAL LOW (ref 3.5–5.0)
Alkaline Phosphatase: 60 U/L (ref 38–126)
Anion gap: 9 (ref 5–15)
BUN: 15 mg/dL (ref 8–23)
CO2: 26 mmol/L (ref 22–32)
Calcium: 9.7 mg/dL (ref 8.9–10.3)
Chloride: 104 mmol/L (ref 98–111)
Creatinine, Ser: 1.08 mg/dL — ABNORMAL HIGH (ref 0.44–1.00)
GFR, Estimated: 50 mL/min — ABNORMAL LOW (ref >60)
Glucose, Bld: 244 mg/dL — ABNORMAL HIGH (ref 70–99)
Potassium: 4.2 mmol/L (ref 3.5–5.1)
Sodium: 139 mmol/L (ref 135–145)
Total Bilirubin: 0.6 mg/dL (ref 0.3–1.2)
Total Protein: 6.7 g/dL (ref 6.5–8.1)

## 2023-04-10 LAB — SARS CORONAVIRUS 2 BY RT PCR: SARS Coronavirus 2 by RT PCR: NEGATIVE

## 2023-04-10 LAB — TROPONIN I (HIGH SENSITIVITY)
Troponin I (High Sensitivity): 58 ng/L — ABNORMAL HIGH (ref <18)
Troponin I (High Sensitivity): 61 ng/L — ABNORMAL HIGH (ref <18)

## 2023-04-10 LAB — LIPASE, BLOOD: Lipase: 27 U/L (ref 11–51)

## 2023-04-10 MED ORDER — LIDOCAINE 5 % EX PTCH: 1.0000 | MEDICATED_PATCH | CUTANEOUS | Status: DC

## 2023-04-10 NOTE — ED Provider Notes (Signed)
[place] [facility] DEPARTMENT AT [facility] Provider Note   CSN: [id number] Arrival date & time: 04/10/23  1200     History  Chief Complaint  Patient presents with   Chest Pain    Tammy Hughes is a 89 y.o. female.  89 yo F with a chief complaints of left-sided chest pain.  This was left lateral she points along the left side of her chest wall.  Started suddenly earlier today.  She had some earlier this morning but was not that severe and then right before she arrived she said that she had had some significant discomfort.  Is a bit better now.  She thinks maybe the medicine they gave her on the ambulance made it better.  Otherwise nothing seem to make it better or worse.  She has had a little bit of a cough.  Denies trauma.   Chest Pain      Home Medications Prior to Admission medications   Medication Sig Start Date End Date Taking? Authorizing Provider  Acetaminophen (TYLENOL PO) Take 500 mg by mouth every 6 (six) hours as needed (Pain).   Yes [provider]  clopidogrel (PLAVIX) 75 MG tablet TAKE 1 TABLET BY MOUTH ONCE DAILY 12/10/22  Yes Ardith Dark, MD  donepezil (ARICEPT) 10 MG tablet TAKE 1 TABLET BY MOUTH ONCE DAILY 09/03/22  Yes Marcos Eke, PA-C  escitalopram (LEXAPRO) 5 MG tablet Take 5 mg by mouth daily. 03/05/23  Yes [provider]  insulin aspart protamine - aspart (NOVOLOG MIX 70/30 FLEXPEN) (70-30) 100 UNIT/ML FlexPen Inject 13 Units into the skin daily with breakfast AND 10 Units daily with supper. 05/31/22  Yes Shamleffer, Konrad Dolores, MD  meclizine (ANTIVERT) 25 MG tablet TAKE 1 TABLET BY MOUTH THREE TIMES DAILY IF NEEDED FOR DIZZINESS 01/07/20  Yes Orland Mustard, MD  mirtazapine (REMERON) 15 MG tablet TAKE 1 TABLET BY MOUTH AT BEDTIME 10/15/22  Yes Ardith Dark, MD  pantoprazole (PROTONIX) 40 MG tablet Take 1 tablet (40 mg total) by mouth daily. 05/27/22  Yes Cobb, Ruby Cola, NP  rosuvastatin (CRESTOR) 5 MG tablet  TAKE 1 TABLET BY MOUTH ONCE DAILY 04/08/23  Yes Ardith Dark, MD      Allergies    Pirfenidone and Aspirin    Review of Systems   Review of Systems  Cardiovascular:  Positive for chest pain.    Physical Exam Updated Vital Signs BP 127/63   Pulse (!) 55   Temp 98.1 F (36.7 C) (Oral)   Resp 12   Ht 5' (1.524 m)   Wt 57.6 kg   SpO2 99%   BMI 24.80 kg/m  Physical Exam Vitals and nursing note reviewed.  Constitutional:      General: She is not in acute distress.    Appearance: She is well-developed. She is not diaphoretic.  HENT:     Head: Normocephalic and atraumatic.  Eyes:     Pupils: Pupils are equal, round, and reactive to light.  Cardiovascular:     Rate and Rhythm: Normal rate and regular rhythm.     Heart sounds: No murmur heard.    No friction rub. No gallop.  Pulmonary:     Effort: Pulmonary effort is normal.     Breath sounds: No wheezing or rales.  Chest:     Chest wall: Tenderness present.     Comments: Patient points to the anterior axillary line about ribs 4 through 6.  She has palpable discomfort there.  No obvious rash. Abdominal:     General: There is no distension.     Palpations: Abdomen is soft.     Tenderness: There is no abdominal tenderness.  Musculoskeletal:        General: No tenderness.     Cervical back: Normal range of motion and neck supple.  Skin:    General: Skin is warm and dry.  Neurological:     Mental Status: She is alert and oriented to person, place, and time.  Psychiatric:        Behavior: Behavior normal.     ED Results / Procedures / Treatments   Labs (all labs ordered are listed, but only abnormal results are displayed) Labs Reviewed  CBC WITH DIFFERENTIAL/PLATELET - Abnormal; Notable for the following components:      Result Value   RBC 3.62 (*)    Hemoglobin 11.1 (*)    HCT 34.3 (*)    All other components within normal limits  COMPREHENSIVE METABOLIC PANEL - Abnormal; Notable for the following components:    Glucose, Bld 244 (*)    Creatinine, Ser 1.08 (*)    Albumin 3.1 (*)    GFR, Estimated 50 (*)    All other components within normal limits  TROPONIN I (HIGH SENSITIVITY) - Abnormal; Notable for the following components:   Troponin I (High Sensitivity) 58 (*)    All other components within normal limits  LIPASE, BLOOD  TROPONIN I (HIGH SENSITIVITY)    EKG EKG Interpretation Date/Time:  Thursday April 10 2023 12:11:26 EDT Ventricular Rate:  62 PR Interval:  223 QRS Duration:  74 QT Interval:  682 QTC Calculation: 699 R Axis:   -7  Text Interpretation: Sinus rhythm Prolonged PR interval Consider left atrial enlargement Nonspecific T abnormalities, lateral leads No significant change since last tracing Confirmed by Melene Plan [phone] on 04/10/2023 12:37:25 PM  Radiology DG Chest Port 1 View  Result Date: 04/10/2023 CLINICAL DATA:  Chest pain EXAM: PORTABLE CHEST 1 VIEW COMPARISON:  Chest radiograph dated 06/18/2015 FINDINGS: Low lung volumes with bronchovascular crowding. Bilateral interstitial and bibasilar patchy opacities. No pleural effusion or pneumothorax. The heart size and mediastinal contours are within normal limits. No acute osseous abnormality. IMPRESSION: Low lung volumes with bilateral interstitial and bibasilar patchy opacities, which may represent atelectasis or infection superimposed on a background of pulmonary fibrosis. Electronically Signed   By: Agustin Cree M.D.   On: 04/10/2023 15:01    Procedures Procedures    Medications Ordered in ED Medications - No data to display  ED Course/ Medical Decision Making/ A&P                                 Medical Decision Making Amount and/or Complexity of Data Reviewed Labs: ordered. Radiology: ordered.   76 y F with a chief complaints of left-sided chest pain.  This is atypical in nature and reproduced on exam.  It did start just prior to arrival.  Will obtain 2 troponins.  EKG is not ischemic.  Chest x-ray  independently interpreted by me without obvious focal infiltrait or pneumothorax.  Patient's troponins elevated.  On my record review the patient appears to have serially elevated troponins.  Most recently had these checked before her transition to the sensitive assay.  Awaiting delta.   Patient care was signed out to Dr. Dalene Seltzer, please see their note for further details care in the ED   The  patients results and plan were reviewed and discussed.   Any x-rays performed were independently reviewed by myself.   Differential diagnosis were considered with the presenting HPI.  Medications - No data to display  Vitals:   04/10/23 1315 04/10/23 1330 04/10/23 1345 04/10/23 1415  BP: 126/64 125/61 122/61 127/63  Pulse: (!) 55 (!) 57 (!) 53 (!) 55  Resp: 18 14 14 12   Temp:      TempSrc:      SpO2: 99% 99% 99% 99%  Weight:      Height:        Final diagnoses:  Nonspecific chest pain          Final Clinical Impression(s) / ED Diagnoses Final diagnoses:  Nonspecific chest pain    Rx / DC Orders ED Discharge Orders     None         Melene Plan, DO 04/10/23 1514

## 2023-04-10 NOTE — ED Triage Notes (Signed)
Patient presents with Left sided chest pain.  EMS gave nitroglycerin which dropped patients pressure 50 points it did help her chest pain slightly. Denies n/v/sob or back pain.

## 2023-04-10 NOTE — ED Provider Notes (Signed)
  Physical Exam  BP 127/63   Pulse (!) 55   Temp 98.1 F (36.7 C) (Oral)   Resp 12   Ht 5' (1.524 m)   Wt 57.6 kg   SpO2 99%   BMI 24.80 kg/m   Physical Exam  Procedures  Procedures  ED Course / MDM     Received care of patient from Dr. Adela Lank.  Please see his note for prior history, physical and care.  Briefly is a 89 year old female who presented with concern for chest pain.  The chest pain is reproducible on exam over the her left chest wall.  She has had previous elevation in troponins ranging from the 50s to 100s when adjusted.  Today she is 50 and 60.  Do not feel this trend or her history and exam in comparison to her prior more elevated troponin levels is consistent with ACS.  Low suspicion at this time for pneumonia, pulmonary embolus, aortic dissection, pyelonephritis.  Given lidocaine patch for suspected muscular pain given tenderness over the ribs, recommend outpatient follow-up.      Tammy Monday, MD 04/12/23 1433

## 2023-04-11 MED ORDER — ESCITALOPRAM OXALATE 5 MG PO TABS: 5.0000 mg | ORAL_TABLET | Freq: Every day | ORAL | 1 refills | Status: DC

## 2023-04-14 ENCOUNTER — Other Ambulatory Visit (INDEPENDENT_AMBULATORY_CARE_PROVIDER_SITE_OTHER): Payer: Medicare (Managed Care)

## 2023-04-14 DIAGNOSIS — Z794 Long term (current) use of insulin: Secondary | ICD-10-CM | POA: Diagnosis not present

## 2023-04-14 DIAGNOSIS — E1165 Type 2 diabetes mellitus with hyperglycemia: Secondary | ICD-10-CM | POA: Diagnosis not present

## 2023-04-14 LAB — COMPREHENSIVE METABOLIC PANEL
ALT: 12 U/L (ref 0–35)
AST: 17 U/L (ref 0–37)
Albumin: 3.8 g/dL (ref 3.5–5.2)
Alkaline Phosphatase: 73 U/L (ref 39–117)
BUN: 19 mg/dL (ref 6–23)
CO2: 29 meq/L (ref 19–32)
Calcium: 10.1 mg/dL (ref 8.4–10.5)
Chloride: 104 meq/L (ref 96–112)
Creatinine, Ser: 1.29 mg/dL — ABNORMAL HIGH (ref 0.40–1.20)
GFR: 37.19 mL/min — ABNORMAL LOW (ref >60.00)
Glucose, Bld: 137 mg/dL — ABNORMAL HIGH (ref 70–99)
Potassium: 4 meq/L (ref 3.5–5.1)
Sodium: 142 meq/L (ref 135–145)
Total Bilirubin: 0.3 mg/dL (ref 0.2–1.2)
Total Protein: 7.1 g/dL (ref 6.0–8.3)

## 2023-04-14 LAB — LIPID PANEL
Cholesterol: 144 mg/dL (ref 0–200)
HDL: 45.1 mg/dL (ref >39.00)
LDL Cholesterol: 66 mg/dL (ref 0–99)
NonHDL: 98.64
Total CHOL/HDL Ratio: 3
Triglycerides: 165 mg/dL — ABNORMAL HIGH (ref 0.0–149.0)
VLDL: 33 mg/dL (ref 0.0–40.0)

## 2023-04-14 LAB — MICROALBUMIN / CREATININE URINE RATIO
Creatinine,U: 95.6 mg/dL
Microalb Creat Ratio: 9.2 mg/g (ref 0.0–30.0)
Microalb, Ur: 8.8 mg/dL — ABNORMAL HIGH (ref 0.0–1.9)

## 2023-04-14 LAB — HEMOGLOBIN A1C: Hgb A1c MFr Bld: 8.2 % — ABNORMAL HIGH (ref 4.6–6.5)

## 2023-04-18 ENCOUNTER — Encounter: Payer: Self-pay | Admitting: Family Medicine

## 2023-04-18 ENCOUNTER — Ambulatory Visit (INDEPENDENT_AMBULATORY_CARE_PROVIDER_SITE_OTHER): Payer: Medicare HMO | Admitting: Family Medicine

## 2023-04-18 VITALS — BP 130/77 | HR 56 | Temp 97.8°F | Ht 60.0 in | Wt 129.0 lb

## 2023-04-18 DIAGNOSIS — R079 Chest pain, unspecified: Secondary | ICD-10-CM

## 2023-04-18 DIAGNOSIS — I152 Hypertension secondary to endocrine disorders: Secondary | ICD-10-CM

## 2023-04-18 DIAGNOSIS — E1159 Type 2 diabetes mellitus with other circulatory complications: Secondary | ICD-10-CM

## 2023-04-18 DIAGNOSIS — K219 Gastro-esophageal reflux disease without esophagitis: Secondary | ICD-10-CM

## 2023-04-18 NOTE — Assessment & Plan Note (Signed)
She is no longer on Protonix.  She is not having any reflux or indigestion however this may be contributing to her above chest pain.  Advised her to use Pepcid as needed.

## 2023-04-18 NOTE — Progress Notes (Signed)
   Tammy Hughes is a 89 y.o. female who presents today for an office visit.  Assessment/Plan:  New/Acute Problems: Chest Pain Resolved.  No recurrence in symptoms since her ED visit.  Cardiac workup there was negative.  Potentially musculoskeletal though maybe.  GI related as well.  Will be treating her reflux as below.  She will let us know if she has any recurrence.  Given her negative cardiac workup and lack of recurrence do not think we need to pursue further testing at this point.  We discussed reasons to return to care and seek emergent care.  Chronic Problems Addressed Today: GERD She is no longer on Protonix.  She is not having any reflux or indigestion however this may be contributing to her above chest pain.  Advised her to use Pepcid as needed.  Hypertension associated with diabetes (HCC) Blood pressure at goal today off medications.     Subjective:  HPI:  Patient here for ED follow-up.  Went to the ED a week ago with chest pain.  Had workup in ED including labs, x-ray, EKG.  Her delta troponin was reassuring.  Her chest pain was reproducible on exam and thought to be musculoskeletal in etiology.  Patient's chest pain located in left upper quadrant abdomen and left lower chest.  Tenderness on palpation.  Since being home, she has not had any more chest pain. No shortness of breath. No dizziness. No lightheaded. No nausea or vomiting.  She has been eating a lot more tomatoes recently and thinks this may be contributing.  She is worried about gas or worsening reflux.       Objective:  Physical Exam: BP 130/77   Pulse (!) 56   Temp 97.8 F (36.6 C) (Temporal)   Ht 5' (1.524 m)   Wt 129 lb (58.5 kg)   SpO2 96%   BMI 25.19 kg/m   Gen: No acute distress, resting comfortably CV: Regular rate and rhythm with no murmurs appreciated Pulm: Normal work of breathing, clear to auscultation bilaterally with no crackles, wheezes, or rhonchi Neuro: Grossly normal, moves all  extremities Psych: Normal affect and thought content      Rozena Fierro M. Jimmey Ralph, MD 04/18/2023 1:41 PM

## 2023-04-18 NOTE — Assessment & Plan Note (Signed)
Blood pressure at goal today off medications. 

## 2023-04-18 NOTE — Patient Instructions (Signed)
It was very nice to see you today!  I am glad that you are feeling better.  Please try Pepcid or Gas-X if you have any heartburn or reflux.  Return if symptoms worsen or fail to improve.   Take care, Dr Jimmey Ralph  PLEASE NOTE:  If you had any lab tests, please let us know if you have not heard back within a few days. You may see your results on mychart before we have a chance to review them but we will give you a call once they are reviewed by [place].   If we ordered any referrals today, please let us know if you have not heard from their office within the next week.   If you had any urgent prescriptions sent in today, please check with the pharmacy within an hour of our visit to make sure the prescription was transmitted appropriately.   Please try these tips to maintain a healthy lifestyle:  Eat at least 3 REAL meals and 1-2 snacks per day.  Aim for no more than 5 hours between eating.  If you eat breakfast, please do so within one hour of getting up.   Each meal should contain half fruits/vegetables, one quarter protein, and one quarter carbs (no bigger than a computer mouse)  Cut down on sweet beverages. This includes juice, soda, and sweet tea.   Drink at least 1 glass of water with each meal and aim for at least 8 glasses per day  Exercise at least 150 minutes every week.

## 2023-04-21 ENCOUNTER — Encounter: Payer: Self-pay | Admitting: "Endocrinology

## 2023-04-21 ENCOUNTER — Ambulatory Visit: Payer: Medicare HMO | Admitting: "Endocrinology

## 2023-04-21 VITALS — BP 133/53 | HR 59 | Ht 60.0 in | Wt 129.8 lb

## 2023-04-21 DIAGNOSIS — E782 Mixed hyperlipidemia: Secondary | ICD-10-CM

## 2023-04-21 DIAGNOSIS — Z794 Long term (current) use of insulin: Secondary | ICD-10-CM

## 2023-04-21 DIAGNOSIS — E1165 Type 2 diabetes mellitus with hyperglycemia: Secondary | ICD-10-CM

## 2023-04-21 MED ORDER — GVOKE HYPOPEN 1-PACK 1 MG/0.2ML ~~LOC~~ SOAJ: 1.0000 mg | SUBCUTANEOUS | 2 refills | Status: AC | PRN

## 2023-04-21 NOTE — Progress Notes (Signed)
Outpatient Endocrinology Note Tammy [place], MD  04/21/23   Tammy Hughes January 02, 1984 [id number]  Referring Provider: Ardith Dark, MD Primary Care Provider: Ardith Dark, MD Reason for consultation: Subjective   Assessment & Plan  Diagnoses and all orders for this visit:  Uncontrolled type 2 diabetes mellitus with hyperglycemia, with long-term current use of insulin (HCC)  Long-term insulin use (HCC)  Mixed hypercholesterolemia and hypertriglyceridemia  Other orders -     Glucagon (GVOKE HYPOPEN 1-PACK) 1 MG/0.2ML SOAJ; Inject 1 mg into the skin as needed (low blood sugar with impaired consciousness).     Diabetes Type II complicated by nephropathy  Lab Results  Component Value Date   GFR 37.19 (L) 04/14/2023   Hba1c goal less than 7, current Hba1c is  Lab Results  Component Value Date   HGBA1C 8.2 (H) 04/14/2023   Will recommend the following: NovoLog Mix 70/30 insulin: 16 units a.m. and 10 units before dinner-15 min before meals  Patient had rare random low blood sugars in the afternoon/evening, restricting the increase of her NovoLog in the morning to take care of her mostly elevated daytime blood sugars Changed the libre alarm low setting from 70 to 80 to allow patient to take snacks to avoid low blood sugar, instructed to keep snacks by bedside Fasting labs before next visit Ordered glucagon on 04/21/2023 and discussed the use at length with patient and her accompanying attendant (daughter)  No known contraindications to any of above medications  -Last LD and Tg are as follows: Lab Results  Component Value Date   [facility] 66 04/14/2023    Lab Results  Component Value Date   TRIG 165.0 (H) 04/14/2023   -Recommend rosuvastatin 5 mg QD -Follow low fat diet and exercise   -Blood pressure goal <140/90 - Microalbumin/creatinine goal < 30 -Last MA/Cr is as follows: Lab Results  Component Value Date   MICROALBUR 8.8 (H) 04/14/2023   -not on  ACE/ARB  -diet changes including salt restriction -limit eating outside -counseled BP targets per standards of diabetes care -uncontrolled blood pressure can lead to retinopathy, nephropathy and cardiovascular and atherosclerotic heart disease  Reviewed and counseled on: -A1C target -Blood sugar targets -Complications of uncontrolled diabetes  -Checking blood sugar before meals and bedtime and bring log next visit -All medications with mechanism of action and side effects -Hypoglycemia management: rule of 15's, Glucagon Emergency Kit and medical alert ID -low-carb low-fat plate-method diet -At least 20 minutes of physical activity per day -Annual dilated retinal eye exam and foot exam -compliance and follow up needs -follow up as scheduled or earlier if problem gets worse  Call if blood sugar is less than 70 or consistently above 250    Take a 15 gm snack of carbohydrate at bedtime before you go to sleep if your blood sugar is less than 100.    If you are going to fast after midnight for a test or procedure, ask your physician for instructions on how to reduce/decrease your insulin dose.    Call if blood sugar is less than 70 or consistently above 250  -Treating a low sugar by rule of 15  (15 gms of sugar every 15 min until sugar is more than 70) If you feel your sugar is low, test your sugar to be sure If your sugar is low (less than 70), then take 15 grams of a fast acting Carbohydrate (3-4 glucose tablets or glucose gel or 4 ounces of juice or regular  soda) Recheck your sugar 15 min after treating low to make sure it is more than 70 If sugar is still less than 70, treat again with 15 grams of carbohydrate          Don't drive the hour of hypoglycemia  If unconscious/unable to eat or drink by mouth, use glucagon injection or nasal spray baqsimi and call 911. Can repeat again in 15 min if still unconscious.  Return in about 2 months (around 06/21/2023).   I have reviewed current  medications, nurse's notes, allergies, vital signs, past medical and surgical history, family medical history, and social history for this encounter. Counseled patient on symptoms, examination findings, lab findings, imaging results, treatment decisions and monitoring and prognosis. The patient understood the recommendations and agrees with the treatment plan. All questions regarding treatment plan were fully answered.  Tammy , MD  04/21/23    History of Present Illness Tammy Hughes is a 89 y.o. year old female who presents for evaluation of Type II diabetes mellitus.  Tammy Hughes was first diagnosed in 1995.   Diabetes education +  Diagnosis date: 1995  Current regimen: NovoLog Mix 70/30 16 units a.m. and 10 units before dinner  Previous history:  Non-insulin hypoglycemic drugs previously used:?  Metformin, detailed history not available Insulin was started in 2005 Appears to have been on premixed insulin for several years She is appears to have relatively stable control except since about early 2022  COMPLICATIONS -  MI/Stroke -  retinopathy -  neuropathy  + nephropathy  BLOOD SUGAR DATA  CGM interpretation: At today's visit, we reviewed her CGM downloads. The full report is scanned in the media. Reviewing the CGM trends, BG are elevated through the day but well controlled overnight, with some rare random lows in the afternoon/evening.  Physical Exam  BP (!) 133/53   Pulse (!) 59   Ht 5' (1.524 m)   Wt 129 lb 12.8 oz (58.9 kg)   SpO2 96%   BMI 25.35 kg/m    Constitutional: well developed, well nourished Head: normocephalic, atraumatic Eyes: sclera anicteric, no redness Neck: supple Lungs: normal respiratory effort Neurology: alert and oriented Skin: dry, no appreciable rashes Musculoskeletal: no appreciable defects Psychiatric: normal mood and affect Diabetic Foot Exam - Simple   Simple Foot Form Diabetic Foot exam was performed with the following  findings: Yes 04/21/2023 10:35 AM  Visual Inspection No deformities, no ulcerations, no other skin breakdown bilaterally: Yes Sensation Testing Intact to touch and monofilament testing bilaterally: Yes Pulse Check Posterior Tibialis and Dorsalis pulse intact bilaterally: Yes Comments      Current Medications Patient's Medications  New Prescriptions   GLUCAGON (GVOKE HYPOPEN 1-PACK) 1 MG/0.2ML SOAJ    Inject 1 mg into the skin as needed (low blood sugar with impaired consciousness).  Previous Medications   ACETAMINOPHEN (TYLENOL PO)    Take 500 mg by mouth every 6 (six) hours as needed (Pain).   CLOPIDOGREL (PLAVIX) 75 MG TABLET    TAKE 1 TABLET BY MOUTH ONCE DAILY   DONEPEZIL (ARICEPT) 10 MG TABLET    TAKE 1 TABLET BY MOUTH ONCE DAILY   ESCITALOPRAM (LEXAPRO) 5 MG TABLET    Take 1 tablet (5 mg total) by mouth daily.   INSULIN ASPART PROTAMINE - ASPART (NOVOLOG MIX 70/30 FLEXPEN) (70-30) 100 UNIT/ML FLEXPEN    Inject 13 Units into the skin daily with breakfast AND 10 Units daily with supper.   MECLIZINE (ANTIVERT) 25 MG TABLET  TAKE 1 TABLET BY MOUTH THREE TIMES DAILY IF NEEDED FOR DIZZINESS   MIRTAZAPINE (REMERON) 15 MG TABLET    TAKE 1 TABLET BY MOUTH AT BEDTIME   PANTOPRAZOLE (PROTONIX) 40 MG TABLET    Take 1 tablet (40 mg total) by mouth daily.   ROSUVASTATIN (CRESTOR) 5 MG TABLET    TAKE 1 TABLET BY MOUTH ONCE DAILY  Modified Medications   No medications on file  Discontinued Medications   No medications on file    Allergies Allergies  Allergen Reactions   Pirfenidone Diarrhea and Nausea And Vomiting   Aspirin     rash    Past Medical History Past Medical History:  Diagnosis Date   Allergy    SEASONAL   ANXIETY 03/08/2008   Cataract    REMOVED BILATERAL   Depression    DIVERTICULOSIS, COLON 03/08/2008   DVT (deep venous thrombosis) (HCC)    GERD 02/23/2007   HYPERCHOLESTEROLEMIA 02/01/2008   HYPERTENSION 03/08/2008   Insulin dependent diabetes mellitus     OSTEOARTHRITIS 02/23/2007   OSTEOPOROSIS 02/23/2007   PERIPHERAL NEUROPATHY 02/23/2007   STD (sexually transmitted disease)    Stomach cancer (HCC) 07/20/14   invasive adenocarcinoma w/signet rings features   Urinary incontinence    Urolithiasis     Past Surgical History Past Surgical History:  Procedure Laterality Date   ABDOMINAL SURGERY     CARDIAC CATHETERIZATION N/A 02/02/2016   Procedure: Left Heart Cath and Coronary Angiography;  Surgeon: Peter M [place], MD;  Location: [facility] INVASIVE CV LAB;  Service: Cardiovascular;  Laterality: N/A;   CHOLECYSTECTOMY  1995   ESOPHAGOGASTRODUODENOSCOPY  06/08/2002   GASTRECTOMY N/A 07/20/2014   Procedure: PROXIMAL GASTRECTOMY;  Surgeon: Almond Lint, MD;  Location: [facility] OR;  Service: General;  Laterality: N/A;   GASTROJEJUNOSTOMY N/A 07/20/2014   Procedure: GASTROJEJUNOSTOMY;  Surgeon: Almond Lint, MD;  Location: [facility] OR;  Service: General;  Laterality: N/A;   LAPAROSCOPY N/A 07/20/2014   Procedure: LAPAROSCOPY DIAGNOSTIC;  Surgeon: Almond Lint, MD;  Location: [facility] OR;  Service: General;  Laterality: N/A;    Family History family history includes Cancer in her father, sister, and sister; Diabetes in her sister and sister; Other in her mother.  Social History Social History   Socioeconomic History   Marital status: Married    Spouse name: Not on file   Number of children: Not on file   Years of education: Not on file   Highest education level: Not on file  Occupational History   Occupation: Retired    Associate Professor: RETIRED  Tobacco Use   Smoking status: Never   Smokeless tobacco: Never  Vaping Use   Vaping status: Never Used  Substance and Sexual Activity   Alcohol use: No    Alcohol/week: 0.0 standard drinks of alcohol   Drug use: No   Sexual activity: Not Currently    Birth control/protection: Post-menopausal  Other Topics Concern   Not on file  Social History Narrative   Retired-Domestic Assistant   Right handed    Lives with husband     Lives in a one story home   Social Determinants of Health   Financial Resource Strain: Low Risk  (05/24/2022)   Overall Financial Resource Strain (CARDIA)    Difficulty of Paying Living Expenses: Not hard at all  Food Insecurity: No Food Insecurity (05/24/2022)   Hunger Vital Sign    Worried About Running Out of Food in the Last Year: Never true    Ran Out of Food in  the Last Year: Never true  Transportation Needs: No Transportation Needs (05/24/2022)   PRAPARE - Administrator, Civil Service (Medical): No    Lack of Transportation (Non-Medical): No  Physical Activity: Insufficiently Active (05/24/2022)   Exercise Vital Sign    Days of Exercise per Week: 2 days    Minutes of Exercise per Session: 30 min  Stress: No Stress Concern Present (05/24/2022)   [employer] of Occupational Health - Occupational Stress Questionnaire    Feeling of Stress : Not at all  Social Connections: Moderately Integrated (05/24/2022)   Social Connection and Isolation Panel [NHANES]    Frequency of Communication with Friends and Family: More than three times a week    Frequency of Social Gatherings with Friends and Family: More than three times a week    Attends Religious Services: More than 4 times per year    Active Member of Clubs or Organizations: No    Attends Banker Meetings: Never    Marital Status: Married  Catering manager Violence: Not At Risk (05/24/2022)   Humiliation, Afraid, Rape, and Kick questionnaire    Fear of Current or Ex-Partner: No    Emotionally Abused: No    Physically Abused: No    Sexually Abused: No    Lab Results  Component Value Date   HGBA1C 8.2 (H) 04/14/2023   HGBA1C 7.7 (H) 10/15/2022   HGBA1C 8.1 (H) 08/05/2022   Lab Results  Component Value Date   CHOL 144 04/14/2023   Lab Results  Component Value Date   HDL 45.10 04/14/2023   Lab Results  Component Value Date   LDLCALC 66 04/14/2023   Lab Results  Component Value Date    TRIG 165.0 (H) 04/14/2023   Lab Results  Component Value Date   CHOLHDL 3 04/14/2023   Lab Results  Component Value Date   CREATININE 1.29 (H) 04/14/2023   Lab Results  Component Value Date   GFR 37.19 (L) 04/14/2023   Lab Results  Component Value Date   MICROALBUR 8.8 (H) 04/14/2023      Component Value Date/Time   NA 142 04/14/2023 0819   NA 139 11/07/2014 1001   K 4.0 04/14/2023 0819   K 4.6 11/07/2014 1001   CL 104 04/14/2023 0819   CO2 29 04/14/2023 0819   CO2 26 11/07/2014 1001   GLUCOSE 137 (H) 04/14/2023 0819   GLUCOSE 189 (H) 11/07/2014 1001   BUN 19 04/14/2023 0819   BUN 15.8 11/07/2014 1001   CREATININE 1.29 (H) 04/14/2023 0819   CREATININE 0.90 (H) 06/21/2020 1402   CREATININE 0.8 11/07/2014 1001   CALCIUM 10.1 04/14/2023 0819   CALCIUM 9.8 11/07/2014 1001   PROT 7.1 04/14/2023 0819   PROT 7.1 11/07/2014 1001   ALBUMIN 3.8 04/14/2023 0819   ALBUMIN 2.8 (L) 11/07/2014 1001   AST 17 04/14/2023 0819   AST 23 11/07/2014 1001   ALT 12 04/14/2023 0819   ALT 15 11/07/2014 1001   ALKPHOS 73 04/14/2023 0819   ALKPHOS 99 11/07/2014 1001   BILITOT 0.3 04/14/2023 0819   BILITOT 0.28 11/07/2014 1001   GFRNONAA 50 (L) 04/10/2023 1240   GFRNONAA 58 (L) 03/22/2020 1359   GFRAA 67 03/22/2020 1359      Latest Ref Rng & Units 04/14/2023    8:19 AM 04/10/2023   12:40 PM 10/15/2022   11:02 AM  BMP  Glucose 70 - 99 mg/dL 409  811  914   BUN 6 -  23 mg/dL 19  15    Creatinine 1.61 - 1.20 mg/dL 0.96  0.45    Sodium 409 - 145 mEq/L 142  139    Potassium 3.5 - 5.1 mEq/L 4.0  4.2    Chloride 96 - 112 mEq/L 104  104    CO2 19 - 32 mEq/L 29  26    Calcium 8.4 - 10.5 mg/dL 81.1  9.7         Component Value Date/Time   WBC 5.6 04/10/2023 1240   RBC 3.62 (L) 04/10/2023 1240   HGB 11.1 (L) 04/10/2023 1240   HGB 11.0 (L) 11/07/2014 1001   HCT 34.3 (L) 04/10/2023 1240   HCT 34.3 (L) 11/07/2014 1001   PLT 240 04/10/2023 1240   PLT 242 11/07/2014 1001   MCV 94.8  04/10/2023 1240   MCV 89.6 11/07/2014 1001   MCH 30.7 04/10/2023 1240   MCHC 32.4 04/10/2023 1240   RDW 12.2 04/10/2023 1240   RDW 21.5 (H) 11/07/2014 1001   LYMPHSABS 1.3 04/10/2023 1240   LYMPHSABS 0.4 (L) 11/07/2014 1001   MONOABS 0.4 04/10/2023 1240   MONOABS 0.8 11/07/2014 1001   EOSABS 0.1 04/10/2023 1240   EOSABS 0.2 11/07/2014 1001   BASOSABS 0.0 04/10/2023 1240   BASOSABS 0.0 11/07/2014 1001     Parts of this note may have been dictated using voice recognition software. There may be variances in spelling and vocabulary which are unintentional. Not all errors are proofread. Please notify the Thereasa Parkin if any discrepancies are noted or if the meaning of any statement is not clear.

## 2023-04-21 NOTE — Patient Instructions (Signed)

## 2023-04-22 ENCOUNTER — Encounter: Payer: Self-pay | Admitting: "Endocrinology

## 2023-05-08 ENCOUNTER — Other Ambulatory Visit: Payer: Self-pay | Admitting: Physician Assistant

## 2023-05-08 ENCOUNTER — Encounter: Payer: Self-pay | Admitting: Physician Assistant

## 2023-05-08 MED ORDER — DONEPEZIL HCL 10 MG PO TABS: ORAL_TABLET | ORAL | 0 refills | Status: DC

## 2023-06-02 ENCOUNTER — Telehealth: Payer: Self-pay | Admitting: Family Medicine

## 2023-06-02 NOTE — Telephone Encounter (Signed)
Caller was Gay Filler from [employer]. States he has faxed over request for PCP signature to sign off on patient's continuous blood glucose monitor. Please confirm whether or not received.

## 2023-06-03 ENCOUNTER — Other Ambulatory Visit (HOSPITAL_COMMUNITY): Payer: Self-pay

## 2023-06-03 NOTE — Telephone Encounter (Signed)
Form was faxed to [phone]

## 2023-06-04 ENCOUNTER — Ambulatory Visit (INDEPENDENT_AMBULATORY_CARE_PROVIDER_SITE_OTHER): Payer: Medicare HMO

## 2023-06-04 VITALS — Wt 129.0 lb

## 2023-06-04 DIAGNOSIS — Z0000 Encounter for general adult medical examination without abnormal findings: Secondary | ICD-10-CM | POA: Diagnosis not present

## 2023-06-04 NOTE — Patient Instructions (Signed)
Ms. Fedak , Thank you for taking time to come for your Medicare Wellness Visit. I appreciate your ongoing commitment to your health goals. Please review the following plan we discussed and let me know if I can assist you in the future.   Referrals/Orders/Follow-Ups/Clinician Recommendations: Aim for 30 minutes of exercise or brisk walking, 6-8 glasses of water, and 5 servings of fruits and vegetables each day. Each day, aim for 6 glasses of water, plenty of protein in your diet and try to get up and walk/ stretch every hour for 5-10 minutes at a time.    This is a list of the screening recommended for you and due dates:  Health Maintenance  Topic Date Due   COVID-19 Vaccine (6 - 2023-24 season) 07/01/2023   Hemoglobin A1C  10/12/2023   Eye exam for diabetics  03/26/2024   Complete foot exam   04/20/2024   DTaP/Tdap/Td vaccine (4 - Td or Tdap) 11/22/2027   Pneumonia Vaccine  Completed   Flu Shot  Completed   Zoster (Shingles) Vaccine  Completed   HPV Vaccine  Aged Out   Mammogram  Discontinued   DEXA scan (bone density measurement)  Discontinued    Advanced directives: (Copy Requested) Please bring a copy of your health care power of attorney and living will to the office to be added to your chart at your convenience.  Next Medicare Annual Wellness Visit scheduled for next year: Yes

## 2023-06-04 NOTE — Progress Notes (Signed)
Subjective:   Tammy Hughes is a 89 y.o. female who presents for Medicare Annual (Subsequent) preventive examination.  Visit Complete: Virtual I connected with  Jeri Lager on 06/04/23 by a audio enabled telemedicine application and verified that I am speaking with the correct person using two identifiers. Along with daughter Juliette Alcide   Patient Location: Home  Provider Location: Home Office  I discussed the limitations of evaluation and management by telemedicine. The patient expressed understanding and agreed to proceed.  Vital Signs: Because this visit was a virtual/telehealth visit, some criteria may be missing or patient reported. Any vitals not documented were not able to be obtained and vitals that have been documented are patient reported.   Cardiac Risk Factors include: advanced age (>41men, >33 women);diabetes mellitus;dyslipidemia;hypertension     Objective:    Today's Vitals   06/04/23 1051  Weight: 129 lb (58.5 kg)   Body mass index is 25.19 kg/m.     06/04/2023   10:57 AM 04/10/2023   12:03 PM 09/25/2022   11:34 AM 09/20/2022   12:02 PM 05/24/2022   10:19 AM 03/27/2022    1:09 PM 09/19/2021    1:07 PM  Advanced Directives  Does Patient Have a Medical Advance Directive? Yes No Yes Yes Yes Yes No  Type of Estate agent of [place];Living will  Healthcare Power of [employer] of [place];Living will Healthcare Power of [place];Living will    Does patient want to make changes to medical advance directive?   No - Patient declined No - Patient declined     Copy of Healthcare Power of Attorney in Chart? No - copy requested  No - copy requested No - copy requested No - copy requested    Would patient like information on creating a medical advance directive?  No - Patient declined         Current Medications (verified) Outpatient Encounter Medications as of 06/04/2023  Medication Sig   Acetaminophen (TYLENOL PO) Take 500 mg by  mouth every 6 (six) hours as needed (Pain).   clopidogrel (PLAVIX) 75 MG tablet TAKE 1 TABLET BY MOUTH ONCE DAILY   Continuous Glucose Sensor (FREESTYLE LIBRE 2 SENSOR) MISC CHANGE EVERY 14 DAYS   donepezil (ARICEPT) 10 MG tablet TAKE 1 TABLET BY MOUTH ONCE DAILY   escitalopram (LEXAPRO) 5 MG tablet Take 1 tablet (5 mg total) by mouth daily.   Glucagon (GVOKE HYPOPEN 1-PACK) 1 MG/0.2ML SOAJ Inject 1 mg into the skin as needed (low blood sugar with impaired consciousness).   insulin aspart protamine - aspart (NOVOLOG MIX 70/30 FLEXPEN) (70-30) 100 UNIT/ML FlexPen Inject 13 Units into the skin daily with breakfast AND 10 Units daily with supper.   meclizine (ANTIVERT) 25 MG tablet TAKE 1 TABLET BY MOUTH THREE TIMES DAILY IF NEEDED FOR DIZZINESS   mirtazapine (REMERON) 15 MG tablet TAKE 1 TABLET BY MOUTH AT BEDTIME   pantoprazole (PROTONIX) 40 MG tablet Take 1 tablet (40 mg total) by mouth daily.   rosuvastatin (CRESTOR) 5 MG tablet TAKE 1 TABLET BY MOUTH ONCE DAILY   No facility-administered encounter medications on file as of 06/04/2023.    Allergies (verified) Pirfenidone and Aspirin   History: Past Medical History:  Diagnosis Date   Allergy    SEASONAL   ANXIETY 03/08/2008   Cataract    REMOVED BILATERAL   Depression    DIVERTICULOSIS, COLON 03/08/2008   DVT (deep venous thrombosis) (HCC)    GERD 02/23/2007   HYPERCHOLESTEROLEMIA 02/01/2008  HYPERTENSION 03/08/2008   Insulin dependent diabetes mellitus    OSTEOARTHRITIS 02/23/2007   OSTEOPOROSIS 02/23/2007   PERIPHERAL NEUROPATHY 02/23/2007   STD (sexually transmitted disease)    Stomach cancer (HCC) 07/20/14   invasive adenocarcinoma w/signet rings features   Urinary incontinence    Urolithiasis    Past Surgical History:  Procedure Laterality Date   ABDOMINAL SURGERY     CARDIAC CATHETERIZATION N/A 02/02/2016   Procedure: Left Heart Cath and Coronary Angiography;  Surgeon: Peter M [place], MD;  Location: [facility] INVASIVE CV LAB;   Service: Cardiovascular;  Laterality: N/A;   CHOLECYSTECTOMY  1995   ESOPHAGOGASTRODUODENOSCOPY  06/08/2002   GASTRECTOMY N/A 07/20/2014   Procedure: PROXIMAL GASTRECTOMY;  Surgeon: Almond Lint, MD;  Location: [facility] OR;  Service: General;  Laterality: N/A;   GASTROJEJUNOSTOMY N/A 07/20/2014   Procedure: GASTROJEJUNOSTOMY;  Surgeon: Almond Lint, MD;  Location: [facility] OR;  Service: General;  Laterality: N/A;   LAPAROSCOPY N/A 07/20/2014   Procedure: LAPAROSCOPY DIAGNOSTIC;  Surgeon: Almond Lint, MD;  Location: [facility] OR;  Service: General;  Laterality: N/A;   Family History  Problem Relation Age of Onset   Cancer Father        Prostate Cancer   Cancer Sister        Breast Cancer   Diabetes Sister    Diabetes Sister    Other Mother        pneumonia    Cancer Sister        breast cancer    Social History   Socioeconomic History   Marital status: Married    Spouse name: Not on file   Number of children: Not on file   Years of education: Not on file   Highest education level: Not on file  Occupational History   Occupation: Retired    Associate Professor: RETIRED  Tobacco Use   Smoking status: Never   Smokeless tobacco: Never  Vaping Use   Vaping status: Never Used  Substance and Sexual Activity   Alcohol use: No    Alcohol/week: 0.0 standard drinks of alcohol   Drug use: No   Sexual activity: Not Currently    Birth control/protection: Post-menopausal  Other Topics Concern   Not on file  Social History Narrative   Retired-Domestic Assistant   Right handed    Lives with husband    Lives in a one story home   Social Determinants of Health   Financial Resource Strain: Low Risk  (06/04/2023)   Overall Financial Resource Strain (CARDIA)    Difficulty of Paying Living Expenses: Not hard at all  Food Insecurity: No Food Insecurity (06/04/2023)   Hunger Vital Sign    Worried About Running Out of Food in the Last Year: Never true    Ran Out of Food in the Last Year: Never true  Transportation  Needs: No Transportation Needs (06/04/2023)   PRAPARE - Administrator, Civil Service (Medical): No    Lack of Transportation (Non-Medical): No  Physical Activity: Inactive (06/04/2023)   Exercise Vital Sign    Days of Exercise per Week: 0 days    Minutes of Exercise per Session: 0 min  Stress: No Stress Concern Present (06/04/2023)   [employer] of Occupational Health - Occupational Stress Questionnaire    Feeling of Stress : Not at all  Social Connections: Moderately Integrated (06/04/2023)   Social Connection and Isolation Panel [NHANES]    Frequency of Communication with Friends and Family: More than three times a week  Frequency of Social Gatherings with Friends and Family: Twice a week    Attends Religious Services: 1 to 4 times per year    Active Member of [employer] or Organizations: No    Attends Engineer, structural: Never    Marital Status: Married    Tobacco Counseling Counseling given: Not Answered   Clinical Intake:  Pre-visit preparation completed: Yes  Pain : No/denies pain     BMI - recorded: 25.19 Nutritional Status: BMI 25 -29 Overweight Nutritional Risks: None Diabetes: Yes CBG done?: Yes (196 per pt) CBG resulted in Enter/ Edit results?: No Did pt. bring in CBG monitor from home?: No     Interpreter Needed?: No  Information entered by :: Lanier Ensign, LPN   Activities of Daily Living    06/04/2023   10:53 AM  In your present state of health, do you have any difficulty performing the following activities:  Hearing? 0  Vision? 0  Difficulty concentrating or making decisions? 0  Walking or climbing stairs? 0  Dressing or bathing? 0  Doing errands, shopping? 0  Preparing Food and eating ? N  Using the Toilet? N  In the past six months, have you accidently leaked urine? N  Do you have problems with loss of bowel control? N  Managing your Medications? N  Managing your Finances? N  Housekeeping or managing your  Housekeeping? N    Patient Care Team: Ardith Dark, MD as PCP - General (Family Medicine) Parke Poisson, MD as PCP - Cardiology (Cardiology) Waymon Budge, MD as Attending Physician (Pulmonary Disease) Louis Meckel, MD (Inactive) as Attending Physician (Gastroenterology) Storm Frisk, MD as Consulting Physician (Pulmonary Disease) Ladene Artist, MD as Consulting Physician (Oncology) Romero Belling, MD (Inactive) as Consulting Physician (Endocrinology) Serita Grammes  as Consulting Physician (Optometry) Karel Jarvis Lesle Chris, MD as Consulting Physician (Neurology) Elon Spanner Madelaine Etienne, MD as Consulting Physician (Obstetrics and Gynecology) Elwyn Reach (Neurology)  Indicate any recent Medical Services you may have received from other than [facility] providers in the past year (date may be approximate).     Assessment:   This is a routine wellness examination for Tammy Hughes.  Hearing/Vision screen Hearing Screening - Comments:: Pt denies any hearing issue  Vision Screening - Comments:: Pt follows up with [facility]    Goals Addressed             This Visit's Progress    Patient Stated       To exercise more        Depression Screen    06/04/2023   10:55 AM 04/18/2023    1:22 PM 02/13/2023   10:53 AM 02/04/2023   11:17 AM 12/31/2022   11:25 AM 10/24/2022   11:24 AM 08/07/2022    9:04 AM  PHQ 2/9 Scores  PHQ - 2 Score 0 0 0 0 0 0 3  PHQ- 9 Score   0    10    Fall Risk    06/04/2023   10:57 AM 04/18/2023    1:23 PM 02/04/2023   11:17 AM 12/31/2022   11:25 AM 10/24/2022   11:24 AM  Fall Risk   Falls in the past year? 0 0 0 0 0  Number falls in past yr: 0 0 0 0 0  Injury with Fall? 0 0 0 0 0  Risk for fall due to : No Fall Risks No Fall Risks No Fall Risks No Fall Risks No Fall Risks  Follow up Falls prevention discussed        MEDICARE RISK AT HOME: Medicare Risk at Home Any stairs in or around the home?: No If so, are there any without  handrails?: No Home free of loose throw rugs in walkways, pet beds, electrical cords, etc?: Yes Adequate lighting in your home to reduce risk of falls?: Yes Life alert?: No Use of a cane, walker or w/c?: No Grab bars in the bathroom?: Yes Shower chair or bench in shower?: No Elevated toilet seat or a handicapped toilet?: No  TIMED UP AND GO:  Was the test performed?  No    Cognitive Function:    09/20/2021    8:00 AM 03/21/2021    1:00 PM 07/13/2020   11:00 AM 09/20/2019   11:23 AM  MMSE - Mini Mental State Exam  Orientation to time 5 4 4 5   Orientation to Place 5 5 5 4   Registration 3 3 3 3   Attention/ Calculation 4 1 0 3  Recall 2 3 2 2   Language- name 2 objects 2 2 2 2   Language- repeat 1 1 0 0  Language- follow 3 step command 3 3 3 2   Language- read & follow direction 1 1 1 1   Write a sentence 1 1 1 1   Copy design 0 0 0 1  Total score 27 24 21 24         06/04/2023   10:58 AM 05/24/2022   10:30 AM 07/03/2020    2:47 PM 06/17/2019   12:39 PM  6CIT Screen  What Year? 0 points 0 points 0 points 0 points  What month? 0 points 0 points 0 points 0 points  What time? 0 points 0 points  0 points  Count back from 20 2 points 2 points 2 points 0 points  Months in reverse 4 points 4 points 4 points 0 points  Repeat phrase 4 points 10 points 6 points 2 points  Total Score 10 points 16 points  2 points    Immunizations Immunization History  Administered Date(s) Administered   Fluad Quad(high Dose 65+) 03/19/2019, 04/18/2020, 06/04/2021, 05/01/2022   Influenza Split 06/14/2004, 04/17/2009, 07/15/2009, 07/15/2010, 05/16/2011, 04/16/2012, 05/15/2013   Influenza Whole 04/11/2008, 03/15/2010   Influenza, High Dose Seasonal PF 05/27/2017, 04/24/2018, 05/06/2023   Influenza,inj,Quad PF,6+ Mos 04/06/2014, 03/28/2015, 03/15/2016   Influenza-Unspecified 04/13/2013   Moderna Covid-19 Fall Seasonal Vaccine 60yrs & older 05/14/2022   Moderna SARS-COV2 Booster Vaccination 10/25/2020    Moderna Sars-Covid-2 Vaccination 09/09/2019, 10/07/2019, 05/09/2020   Pfizer(Comirnaty)Fall Seasonal Vaccine 12 years and older 05/06/2023   Pneumococcal Conjugate-13 11/12/2013   Pneumococcal Polysaccharide-23 07/15/1993, 07/15/2004, 07/23/2011   Td 01/12/1998, 07/17/2007   Tdap 11/21/2017   Zoster Recombinant(Shingrix) 02/05/2022, 06/04/2022   Zoster, Live 06/25/2016    TDAP status: Up to date  Flu Vaccine status: Up to date  Pneumococcal vaccine status: Up to date  Covid-19 vaccine status: Information provided on how to obtain vaccines.   Qualifies for Shingles Vaccine? Yes   Zostavax completed Yes   Shingrix Completed?: Yes  Screening Tests Health Maintenance  Topic Date Due   COVID-19 Vaccine (6 - 2023-24 season) 07/01/2023   HEMOGLOBIN A1C  10/12/2023   OPHTHALMOLOGY EXAM  03/26/2024   FOOT EXAM  04/20/2024   DTaP/Tdap/Td (4 - Td or Tdap) 11/22/2027   Pneumonia Vaccine 57+ Years old  Completed   INFLUENZA VACCINE  Completed   Zoster Vaccines- Shingrix  Completed   HPV VACCINES  Aged Out   MAMMOGRAM  Discontinued   DEXA SCAN  Discontinued    Health Maintenance  There are no preventive care reminders to display for this patient.  Colorectal cancer screening: No longer required.   Mammogram status: No longer required due to age.     Additional Screening:  Vision Screening: Recommended annual ophthalmology exams for early detection of glaucoma and other disorders of the eye. Is the patient up to date with their annual eye exam?  Yes  Who is the provider or what is the name of the office in which the patient attends annual eye exams? [facility]  If pt is not established with a provider, would they like to be referred to a provider to establish care? No .   Dental Screening: Recommended annual dental exams for proper oral hygiene  Diabetic Foot Exam: Diabetic Foot Exam: Completed 04/21/23  Community Resource Referral / Chronic Care Management: CRR  required this visit?  No   CCM required this visit?  No     Plan:     I have personally reviewed and noted the following in the patient's chart:   Medical and social history Use of alcohol, tobacco or illicit drugs  Current medications and supplements including opioid prescriptions. Patient is not currently taking opioid prescriptions. Functional ability and status Nutritional status Physical activity Advanced directives List of other physicians Hospitalizations, surgeries, and ER visits in previous 12 months Vitals Screenings to include cognitive, depression, and falls Referrals and appointments  In addition, I have reviewed and discussed with patient certain preventive protocols, quality metrics, and best practice recommendations. A written personalized care plan for preventive services as well as general preventive health recommendations were provided to patient.     Marzella Schlein, LPN   [id number]   After Visit Summary: (MyChart) Due to this being a telephonic visit, the after visit summary with patients personalized plan was offered to patient via MyChart   Nurse Notes: none

## 2023-06-06 ENCOUNTER — Telehealth: Payer: Self-pay | Admitting: Family Medicine

## 2023-06-06 NOTE — Telephone Encounter (Signed)
See note

## 2023-06-06 NOTE — Telephone Encounter (Signed)
Final outcome: See PCP within 4 hours. See messages below for updates.   Patient Name First: Tammy Last: Hughes Gender: Female DOB: [birth year] Age: 89 Y 1 M 24 D Return Phone Number: [phone] (Primary) Address: City/ State/ Zip: [place] [address] Client  [facility] - Administrator, sports at [facility] Provider Jacquiline Doe- MD Contact Type Call Who Is Calling Patient / Member / Family / Caregiver Call Type Triage / Clinical Relationship To Patient Self Return Phone Number [phone] (Primary) Chief Complaint Abdominal Pain Reason for Call Symptomatic / Request for Health Information Initial Comment Caller says she is having side and abdominal pain. Mild pain Translation No Nurse Assessment Nurse: Lillia Abed, RN, Byrd Hesselbach Date/Time Lamount Cohen Time): 06/06/2023 2:22:13 PM Confirm and document reason for call. If symptomatic, describe symptoms. ---Caller states she has abd pain on her left side. Her stomach has stopped hurting. She thinks she may have a urine infection. Her pain is about 8/10 but worse earlier and it started this morning. Denies nausea but she has frequency. Does the patient have any new or worsening symptoms? ---Yes Will a triage be completed? ---Yes Related visit to physician within the last 2 weeks? ---No Does the PT have any chronic conditions? (i.e. diabetes, asthma, this includes High risk factors for pregnancy, etc.) ---Yes List chronic conditions. ---diabetes stomach ca x 8 yrs hx of UTIs Is this a behavioral health or substance abuse call? ---No Guidelines Guideline Title Affirmed Question Affirmed Notes Nurse Date/Time Lamount Cohen Time) Abdominal Pain - Female [1] MILDMODERATE pain AND [2] constant AND [3] age > 42 years Annitta Needs 06/06/2023 2:29:19 PM  Disp. Time Lamount Cohen Time) Disposition Final User 06/06/2023 2:33:46 PM See HCP within 4 Hours (or PCP triage) Yes  Lillia Abed, RN, Byrd Hesselbach Final Disposition 06/06/2023 2:33:46 PM See HCP within 4 Hours (or PCP triage) Yes Lillia Abed, RN, Raoul Pitch Disagree/Comply Disagree Caller Understands Yes PreDisposition Call Doctor Care Advice Given Per Guideline SEE HCP (OR PCP TRIAGE) WITHIN 4 HOURS: * IF OFFICE WILL BE CLOSED AND NO PCP (PRIMARY CARE PROVIDER) SECOND-LEVEL TRIAGE: You need to be seen within the next 3 or 4 hours. A nearby Urgent Care Center [facility]) is often a good source of care. Another choice is to go to the ED. Go sooner if you become worse. REST: * Lie down. * Rest until you are seen. NOTHING BY MOUTH: * Do not eat or drink anything for now. CALL BACK IF: * You become worse CARE ADVICE given per Abdominal Pain - Female (Adult) guideline.  Comments User: Edmund Hilda, RN Date/Time Lamount Cohen Time): 06/06/2023 2:36:53 PM Caller warm transferred to the office to schedule appt. Per office, there are no appts available at [facility] but there are some available at the other office locations. Pt states she would rather wait to see her doctor on Monday at 1400. Office will check to see if her PCP is okay with pt waiting. PCP states it is okay to wait as long as she is comfortable but if she gets worse to go to ED. Caller verbalized understanding. Referrals GO TO FACILITY REFUSED

## 2023-06-06 NOTE — Telephone Encounter (Signed)
Tammy Hughes, Access nurse, states final determination is for pt to be seen within 4 hours. I informed patient that since there are no more open slots at PCP office, I could get her scheduled at one of our other locations. Patient declined stating she would rather wait for her OV with PCP on 11/25 @ 2pm. I spoke with PCP and he stated this is okay. Per his instruction, I informed patient that if pain worsens over the weekend to go to the ED. Patient and patient's daughter verbalized understanding. Awaiting triage note.

## 2023-06-06 NOTE — Telephone Encounter (Signed)
FYI: This call has been transferred to triage nurse: Access Nurse. Once the result note has been entered staff can address the message at that time.  Patient called in with the following symptoms:  Red Word:abdominal pain   Please advise at Mobile [phone] (mobile)  Message is routed to Provider Pool.

## 2023-06-09 ENCOUNTER — Ambulatory Visit (INDEPENDENT_AMBULATORY_CARE_PROVIDER_SITE_OTHER): Payer: Medicare HMO | Admitting: Family Medicine

## 2023-06-09 ENCOUNTER — Encounter: Payer: Self-pay | Admitting: Family Medicine

## 2023-06-09 VITALS — BP 147/78 | HR 58 | Temp 98.2°F | Ht 60.0 in | Wt 128.2 lb

## 2023-06-09 DIAGNOSIS — R1904 Left lower quadrant abdominal swelling, mass and lump: Secondary | ICD-10-CM | POA: Diagnosis not present

## 2023-06-09 DIAGNOSIS — I152 Hypertension secondary to endocrine disorders: Secondary | ICD-10-CM

## 2023-06-09 DIAGNOSIS — R109 Unspecified abdominal pain: Secondary | ICD-10-CM | POA: Diagnosis not present

## 2023-06-09 DIAGNOSIS — E1159 Type 2 diabetes mellitus with other circulatory complications: Secondary | ICD-10-CM | POA: Diagnosis not present

## 2023-06-09 DIAGNOSIS — E1142 Type 2 diabetes mellitus with diabetic polyneuropathy: Secondary | ICD-10-CM

## 2023-06-09 DIAGNOSIS — N390 Urinary tract infection, site not specified: Secondary | ICD-10-CM | POA: Diagnosis not present

## 2023-06-09 LAB — POCT URINALYSIS DIPSTICK
Bilirubin, UA: NEGATIVE
Blood, UA: NEGATIVE
Glucose, UA: NEGATIVE
Ketones, UA: NEGATIVE
Nitrite, UA: NEGATIVE
Protein, UA: POSITIVE — AB
Spec Grav, UA: 1.025 (ref 1.010–1.025)
Urobilinogen, UA: 0.2 U/dL
pH, UA: 5.5 (ref 5.0–8.0)

## 2023-06-09 LAB — URINALYSIS, ROUTINE W REFLEX MICROSCOPIC
Bilirubin Urine: NEGATIVE
Hgb urine dipstick: NEGATIVE
Ketones, ur: NEGATIVE
Nitrite: NEGATIVE
Specific Gravity, Urine: 1.02 (ref 1.000–1.030)
Total Protein, Urine: 30 — AB
Urine Glucose: 100 — AB
Urobilinogen, UA: 0.2 (ref 0.0–1.0)
pH: 6 (ref 5.0–8.0)

## 2023-06-09 NOTE — Assessment & Plan Note (Addendum)
Mildly elevated today on recheck.  Typically well-controlled without meds.  She will continue to monitor at home and let us know if persistently elevated.

## 2023-06-09 NOTE — Telephone Encounter (Signed)
Patient has an OV today with PCP.

## 2023-06-09 NOTE — Patient Instructions (Signed)
It was very nice to see you today!  I am glad that you are feeling better!  We will contact you with your urine culture results.  Please let us know if you have any return in symptoms.  Return if symptoms worsen or fail to improve.   Take care, Dr Jimmey Ralph  PLEASE NOTE:  If you had any lab tests, please let us know if you have not heard back within a few days. You may see your results on mychart before we have a chance to review them but we will give you a call once they are reviewed by [place].   If we ordered any referrals today, please let us know if you have not heard from their office within the next week.   If you had any urgent prescriptions sent in today, please check with the pharmacy within an hour of our visit to make sure the prescription was transmitted appropriately.   Please try these tips to maintain a healthy lifestyle:  Eat at least 3 REAL meals and 1-2 snacks per day.  Aim for no more than 5 hours between eating.  If you eat breakfast, please do so within one hour of getting up.   Each meal should contain half fruits/vegetables, one quarter protein, and one quarter carbs (no bigger than a computer mouse)  Cut down on sweet beverages. This includes juice, soda, and sweet tea.   Drink at least 1 glass of water with each meal and aim for at least 8 glasses per day  Exercise at least 150 minutes every week.

## 2023-06-09 NOTE — Assessment & Plan Note (Signed)
Follows with endocrinology and will be seeing them in a couple of weeks.  Her sugars have been on the upper 100s and low 200s.  She is currently on 70/30 mix with 13 units in the morning and 10 units in the evening.  Will defer further management to endocrinology.

## 2023-06-09 NOTE — Progress Notes (Signed)
   Tammy Hughes is a 89 y.o. female who presents today for an office visit.  Assessment/Plan:  New/Acute Problems: Abdominal Pain Symptoms have resolved.  Reassuring exam today without any signs of peritonitis.  She is having elevated more urinary frequency however her UA is equivocal.  We will check urine culture to definitively rule out UTI though based on history this does not seem to be likely.  May have had gas pains.  Given that her symptoms have resolved and her exam today is reassuring do not think we need to pursue any further workup at this point.  She will let us know if any symptoms return.  We discussed reasons to return to care or seek emergent care.  Chronic Problems Addressed Today: Type 2 diabetes mellitus with peripheral neuropathy (HCC) Follows with endocrinology and will be seeing them in a couple of weeks.  Her sugars have been on the upper 100s and low 200s.  She is currently on 70/30 mix with 13 units in the morning and 10 units in the evening.  Will defer further management to endocrinology.  Hypertension associated with diabetes (HCC) Mildly elevated today on recheck.  Typically well-controlled without meds.  She will continue to monitor at home and let us know if persistently elevated.    Subjective:  HPI:  See Assessment / plan for status of chronic conditions.  Patient is here today with left-sided flank pain.  She was concerned about possible UTI.  She has had more urinary frequency.  No nausea or vomiting.  Symptoms started about a week or so ago. No constipation or diarrhea. No fevers or chills. No blood in urine. No dysuria.  Patient's symptoms resolved by the next day.  She has not had any recurrence since then.  No development of any new symptoms.  Overall feels well today.       Objective:  Physical Exam: BP (!) 147/78   Pulse (!) 58   Temp 98.2 F (36.8 C) (Temporal)   Ht 5' (1.524 m)   Wt 128 lb 3.2 oz (58.2 kg)   SpO2 98%   BMI 25.04 kg/m    Gen: No acute distress, resting comfortably CV: Regular rate and rhythm with no murmurs appreciated Pulm: Normal work of breathing, clear to auscultation bilaterally with no crackles, wheezes, or rhonchi Abdomen: Bowel sounds present, soft, nontender, nondistended. Neuro: Grossly normal, moves all extremities Psych: Normal affect and thought content      Sharman Garrott M. Jimmey Ralph, MD 06/09/2023 2:59 PM

## 2023-06-10 LAB — URINE CULTURE
MICRO NUMBER:: 15775623
Result:: NO GROWTH
SPECIMEN QUALITY:: ADEQUATE

## 2023-06-11 ENCOUNTER — Other Ambulatory Visit: Payer: Self-pay | Admitting: Family Medicine

## 2023-06-11 MED ORDER — CLOPIDOGREL BISULFATE 75 MG PO TABS: 75.0000 mg | ORAL_TABLET | Freq: Every day | ORAL | 0 refills | Status: DC

## 2023-06-11 NOTE — Progress Notes (Signed)
Good news! Urine culture is negative. She does not have a urinary tract infection. We do not need to do any further testing at this point but she should let us know if her symptoms return.  Tammy Hughes. Jimmey Ralph, MD 06/11/2023 7:29 AM

## 2023-06-20 ENCOUNTER — Inpatient Hospital Stay: Payer: Medicare HMO | Attending: Oncology | Admitting: Oncology

## 2023-06-20 VITALS — BP 142/74 | HR 61 | Temp 98.2°F | Resp 18 | Ht 60.0 in | Wt 127.8 lb

## 2023-06-20 DIAGNOSIS — C169 Malignant neoplasm of stomach, unspecified: Secondary | ICD-10-CM | POA: Diagnosis not present

## 2023-06-20 DIAGNOSIS — E119 Type 2 diabetes mellitus without complications: Secondary | ICD-10-CM | POA: Insufficient documentation

## 2023-06-20 DIAGNOSIS — Z85028 Personal history of other malignant neoplasm of stomach: Secondary | ICD-10-CM | POA: Diagnosis not present

## 2023-06-20 DIAGNOSIS — Z79899 Other long term (current) drug therapy: Secondary | ICD-10-CM | POA: Diagnosis not present

## 2023-06-20 DIAGNOSIS — J8410 Pulmonary fibrosis, unspecified: Secondary | ICD-10-CM | POA: Insufficient documentation

## 2023-06-20 DIAGNOSIS — Z86718 Personal history of other venous thrombosis and embolism: Secondary | ICD-10-CM | POA: Diagnosis not present

## 2023-06-20 DIAGNOSIS — F32A Depression, unspecified: Secondary | ICD-10-CM | POA: Diagnosis not present

## 2023-06-20 NOTE — Progress Notes (Signed)
  [facility] OFFICE PROGRESS NOTE   Diagnosis: Gastric cancer  INTERVAL HISTORY:   Tammy Hughes returns as scheduled.  She is here with her daughter.  She generally feels well.  Occasional abdominal discomfort.  No dysphagia or nausea.  She and her husband continue to live at home.  She has received influenza and COVID-19 vaccines.  Objective:  Vital signs in last 24 hours:  Blood pressure (!) 142/74, pulse 61, temperature 98.2 F (36.8 C), temperature source Temporal, resp. rate 18, height 5' (1.524 m), weight 127 lb 12.8 oz (58 kg), SpO2 98%.    HEENT: Mobile less than 1 cm nodular area overlying the left submandibular gland Lymphatics: No cervical, supraclavicular, axillary, or inguinal nodes Resp: Inspiratory rales at the lower posterior chest bilaterally, no respiratory distress Cardio: Regular rate and rhythm GI: No hepatosplenomegaly, no mass, nontender Vascular: No leg edema   Lab Results:  Lab Results  Component Value Date   WBC 5.6 04/10/2023   HGB 11.1 (L) 04/10/2023   HCT 34.3 (L) 04/10/2023   MCV 94.8 04/10/2023   PLT 240 04/10/2023   NEUTROABS 3.6 04/10/2023    CMP  Lab Results  Component Value Date   NA 142 04/14/2023   K 4.0 04/14/2023   CL 104 04/14/2023   CO2 29 04/14/2023   GLUCOSE 137 (H) 04/14/2023   BUN 19 04/14/2023   CREATININE 1.29 (H) 04/14/2023   CALCIUM 10.1 04/14/2023   PROT 7.1 04/14/2023   ALBUMIN 3.8 04/14/2023   AST 17 04/14/2023   ALT 12 04/14/2023   ALKPHOS 73 04/14/2023   BILITOT 0.3 04/14/2023   GFRNONAA 50 (L) 04/10/2023   GFRAA 67 03/22/2020    No results found for: "CEA1", "CEA", "CAN199", "CA125"  Lab Results  Component Value Date   INR 1.22 02/02/2016   LABPROT 15.6 (H) 02/02/2016    Imaging:  No results found.  Medications: I have reviewed the patient's current medications.   Assessment/Plan: Gastric cancer, status post an endoscopic biopsy of a lesser curvature mass on 05/31/2014  confirming adenocarcinoma Staging CTs of the chest and abdomen on 06/08/2014 revealed no evidence of metastatic disease Proximal gastrectomy and feeding jejunostomy 07/20/2014, pT2,pN1 Adjuvant weekly 5-Fu/leucovorin 09/14/2014, 09/21/2014, 09/28/2014 and 10/05/2014 Initiation of radiation and infusional 5-FU 10/17/2014 5-fluorouracil pump discontinued on 10/27/2014 due to toxicity. Radiation discontinued 11/02/2014    Pulmonary fibrosis Esophageal stricture, status post a dilatation procedure 05/31/2014 Diabetes Postoperative ventricle tachycardia, NSTEMI January 2016 Hospital-acquired pneumonia January 2016 CT abdomen/pelvis 09/02/2014 with postsurgical changes. Focal fluid collection in the midline abutting the distal greater curvature of the stomach measuring 2.2 x 2.9 x 3.4 cm. Mild stranding in the adjacent fat. PICC placement 10/17/2014 Rash. Most likely related to the 5-fluorouracil. Resolved. Right upper extremity DVT 10/25/2014. She completed a course of Xarelto. Hospitalization 10/27/2014 through 10/28/2014 nausea/vomiting and left arm discomfort. Noted to have skin toxicity from the 5-fluorouracil. The 5-fluorouracil was discontinued. Depression. She continues Remeron.  Improved.     Disposition: [name] is in clinical remission from gastric cancer.  She would like to continue follow-up at the cancer center.  She will return for an office visit in November 2025 when she will be 10 years out from diagnosis.  I recommended she remain up-to-date on the pneumonia vaccine.  Thornton Papas, MD  06/20/2023  12:48 PM

## 2023-06-23 ENCOUNTER — Encounter: Payer: Self-pay | Admitting: Family Medicine

## 2023-06-23 ENCOUNTER — Ambulatory Visit (INDEPENDENT_AMBULATORY_CARE_PROVIDER_SITE_OTHER): Payer: Medicare HMO | Admitting: "Endocrinology

## 2023-06-23 ENCOUNTER — Encounter: Payer: Self-pay | Admitting: "Endocrinology

## 2023-06-23 ENCOUNTER — Other Ambulatory Visit: Payer: Self-pay | Admitting: Family Medicine

## 2023-06-23 VITALS — BP 140/64 | HR 85 | Ht 60.0 in | Wt 126.8 lb

## 2023-06-23 DIAGNOSIS — E1165 Type 2 diabetes mellitus with hyperglycemia: Secondary | ICD-10-CM

## 2023-06-23 DIAGNOSIS — Z794 Long term (current) use of insulin: Secondary | ICD-10-CM

## 2023-06-23 DIAGNOSIS — K219 Gastro-esophageal reflux disease without esophagitis: Secondary | ICD-10-CM

## 2023-06-23 DIAGNOSIS — E782 Mixed hyperlipidemia: Secondary | ICD-10-CM

## 2023-06-23 LAB — POCT GLYCOSYLATED HEMOGLOBIN (HGB A1C): Hemoglobin A1C: 8.5 % — AB (ref 4.0–5.6)

## 2023-06-23 MED ORDER — NOVOLOG MIX 70/30 FLEXPEN (70-30) 100 UNIT/ML ~~LOC~~ SUPN: PEN_INJECTOR | SUBCUTANEOUS | 3 refills | Status: DC

## 2023-06-23 MED ORDER — PANTOPRAZOLE SODIUM 40 MG PO TBEC
40.0000 mg | DELAYED_RELEASE_TABLET | Freq: Every day | ORAL | 3 refills | Status: DC
Start: 2023-06-23 — End: 2023-06-24

## 2023-06-23 NOTE — Progress Notes (Signed)
Outpatient Endocrinology Note Tammy Millbrook, MD  06/23/23   Tammy Hughes [birth year] [id number]  Referring Provider: Ardith Dark, MD Primary Care Provider: Ardith Dark, MD Reason for consultation: Subjective   Assessment & Plan  Diagnoses and all orders for this visit:  Uncontrolled type 2 diabetes mellitus with hyperglycemia, with long-term current use of insulin (HCC) -     POCT glycosylated hemoglobin (Hb A1C) -     insulin aspart protamine - aspart (NOVOLOG MIX 70/30 FLEXPEN) (70-30) 100 UNIT/ML FlexPen; Inject 15 Units into the skin daily with breakfast AND 9 Units daily with supper.  Long-term insulin use (HCC)  Mixed hypercholesterolemia and hypertriglyceridemia    Diabetes Type II complicated by nephropathy  Lab Results  Component Value Date   GFR 37.19 (L) 04/14/2023   Hba1c goal less than 7, current Hba1c is  Lab Results  Component Value Date   HGBA1C 8.5 (A) 06/23/2023   Will recommend the following: NovoLog Mix 70/30 insulin: 15 units a.m. and 9 units before dinner-15 min before meals  Previously changed the libre alarm low setting from 70 to 80 to allow patient to take snacks to avoid low blood sugar, instructed to keep snacks by bedside Fasting labs before next visit Ordered glucagon on 04/21/2023 and discussed the use at length with patient and her accompanying attendant (daughter)  No known contraindications to any of above medications  -Last LD and Tg are as follows: Lab Results  Component Value Date   [facility] 66 04/14/2023    Lab Results  Component Value Date   TRIG 165.0 (H) 04/14/2023   -Recommend rosuvastatin 5 mg QD -Follow low fat diet and exercise   -Blood pressure goal <140/90 - Microalbumin/creatinine goal < 30 -Last MA/Cr is as follows: Lab Results  Component Value Date   MICROALBUR 8.8 (H) 04/14/2023   -not on ACE/ARB  -diet changes including salt restriction -limit eating outside -counseled BP targets per  standards of diabetes care -uncontrolled blood pressure can lead to retinopathy, nephropathy and cardiovascular and atherosclerotic heart disease  Reviewed and counseled on: -A1C target -Blood sugar targets -Complications of uncontrolled diabetes  -Checking blood sugar before meals and bedtime and bring log next visit -All medications with mechanism of action and side effects -Hypoglycemia management: rule of 15's, Glucagon Emergency Kit and medical alert ID -low-carb low-fat plate-method diet -At least 20 minutes of physical activity per day -Annual dilated retinal eye exam and foot exam -compliance and follow up needs -follow up as scheduled or earlier if problem gets worse  Call if blood sugar is less than 70 or consistently above 250    Take a 15 gm snack of carbohydrate at bedtime before you go to sleep if your blood sugar is less than 100.    If you are going to fast after midnight for a test or procedure, ask your physician for instructions on how to reduce/decrease your insulin dose.    Call if blood sugar is less than 70 or consistently above 250  -Treating a low sugar by rule of 15  (15 gms of sugar every 15 min until sugar is more than 70) If you feel your sugar is low, test your sugar to be sure If your sugar is low (less than 70), then take 15 grams of a fast acting Carbohydrate (3-4 glucose tablets or glucose gel or 4 ounces of juice or regular soda) Recheck your sugar 15 min after treating low to make sure it is more  than 70 If sugar is still less than 70, treat again with 15 grams of carbohydrate          Don't drive the hour of hypoglycemia  If unconscious/unable to eat or drink by mouth, use glucagon injection or nasal spray baqsimi and call 911. Can repeat again in 15 min if still unconscious.  Return in about 4 weeks (around 07/21/2023).   I have reviewed current medications, nurse's notes, allergies, vital signs, past medical and surgical history, family medical  history, and social history for this encounter. Counseled patient on symptoms, examination findings, lab findings, imaging results, treatment decisions and monitoring and prognosis. The patient understood the recommendations and agrees with the treatment plan. All questions regarding treatment plan were fully answered.  Tammy Sealy, MD  06/23/23   History of Present Illness Tammy Hughes is a 89 y.o. year old female who presents for follow up of Type II diabetes mellitus.  Tammy Hughes was first diagnosed in 1995.   Diabetes education +  Diagnosis date: 1995  Current regimen: NovoLog Mix 70/30 15 units and 9 units before dinner  Previous history:  Non-insulin hypoglycemic drugs previously used:?  Metformin, detailed history not available Insulin was started in 2005 Appears to have been on premixed insulin for several years She is appears to have relatively stable control except since about early 2022  COMPLICATIONS -  MI/Stroke -  retinopathy -  neuropathy  + nephropathy  BLOOD SUGAR DATA  CGM interpretation: At today's visit, we reviewed her CGM downloads. The full report is scanned in the media. Reviewing the CGM trends, BG are elevated in the afternoon and drop low overnight at times.   Physical Exam  BP (!) 140/64   Pulse 85   Ht 5' (1.524 m)   Wt 126 lb 12.8 oz (57.5 kg)   SpO2 95%   BMI 24.76 kg/m    Constitutional: well developed, well nourished Head: normocephalic, atraumatic Eyes: sclera anicteric, no redness Neck: supple Lungs: normal respiratory effort Neurology: alert and oriented Skin: dry, no appreciable rashes Musculoskeletal: no appreciable defects Psychiatric: normal mood and affect Diabetic Foot Exam - Simple   No data filed      Current Medications Patient's Medications  New Prescriptions   No medications on file  Previous Medications   ACETAMINOPHEN (TYLENOL PO)    Take 500 mg by mouth every 6 (six) hours as needed (Pain).    CLOPIDOGREL (PLAVIX) 75 MG TABLET    Take 1 tablet (75 mg total) by mouth daily.   CONTINUOUS GLUCOSE SENSOR (FREESTYLE LIBRE 2 SENSOR) MISC    CHANGE EVERY 14 DAYS   DONEPEZIL (ARICEPT) 10 MG TABLET    TAKE 1 TABLET BY MOUTH ONCE DAILY   ESCITALOPRAM (LEXAPRO) 5 MG TABLET    Take 1 tablet (5 mg total) by mouth daily.   GLUCAGON (GVOKE HYPOPEN 1-PACK) 1 MG/0.2ML SOAJ    Inject 1 mg into the skin as needed (low blood sugar with impaired consciousness).   MECLIZINE (ANTIVERT) 25 MG TABLET    TAKE 1 TABLET BY MOUTH THREE TIMES DAILY IF NEEDED FOR DIZZINESS   MIRTAZAPINE (REMERON) 15 MG TABLET    TAKE 1 TABLET BY MOUTH AT BEDTIME   PANTOPRAZOLE (PROTONIX) 40 MG TABLET    Take 1 tablet (40 mg total) by mouth daily.   ROSUVASTATIN (CRESTOR) 5 MG TABLET    TAKE 1 TABLET BY MOUTH ONCE DAILY  Modified Medications   Modified Medication Previous Medication  INSULIN ASPART PROTAMINE - ASPART (NOVOLOG MIX 70/30 FLEXPEN) (70-30) 100 UNIT/ML FLEXPEN insulin aspart protamine - aspart (NOVOLOG MIX 70/30 FLEXPEN) (70-30) 100 UNIT/ML FlexPen      Inject 15 Units into the skin daily with breakfast AND 9 Units daily with supper.    Inject 13 Units into the skin daily with breakfast AND 10 Units daily with supper.  Discontinued Medications   No medications on file    Allergies Allergies  Allergen Reactions   Pirfenidone Diarrhea and Nausea And Vomiting   Aspirin     rash    Past Medical History Past Medical History:  Diagnosis Date   Allergy    SEASONAL   ANXIETY 03/08/2008   Cataract    REMOVED BILATERAL   Depression    DIVERTICULOSIS, COLON 03/08/2008   DVT (deep venous thrombosis) (HCC)    GERD 02/23/2007   HYPERCHOLESTEROLEMIA 02/01/2008   HYPERTENSION 03/08/2008   Insulin dependent diabetes mellitus    OSTEOARTHRITIS 02/23/2007   OSTEOPOROSIS 02/23/2007   PERIPHERAL NEUROPATHY 02/23/2007   STD (sexually transmitted disease)    Stomach cancer (HCC) 07/20/14   invasive adenocarcinoma w/signet rings  features   Urinary incontinence    Urolithiasis     Past Surgical History Past Surgical History:  Procedure Laterality Date   ABDOMINAL SURGERY     CARDIAC CATHETERIZATION N/A 02/02/2016   Procedure: Left Heart Cath and Coronary Angiography;  Surgeon: Peter M Swaz[place], MD;  Location[facility] INVASIVE CV LAB;  Service: Cardiovascular;  Laterality: N/A;   CHOLECYSTECTOMY  1995   ESOPHAGOGASTRODUODENOSCOPY  06/08/2002   GASTRECTOMY N/A 07/20/2014   Procedure: PROXIMAL GASTRECTOMY;  Surgeon: Almond Lint, MD;  Location: [facility] OR;  Service: General;  Laterality: N/A;   GASTROJEJUNOSTOMY N/A 07/20/2014   Procedure: GASTROJEJUNOSTOMY;  Surgeon: Almond Lint, MD;  Location: [facility] OR;  Service: General;  Laterality: N/A;   LAPAROSCOPY N/A 07/20/2014   Procedure: LAPAROSCOPY DIAGNOSTIC;  Surgeon: Almond Lint, MD;  Location: [facility] OR;  Service: General;  Laterality: N/A;    Family History family history includes Cancer in her father, sister, and sister; Diabetes in her sister and sister; Other in her mother.  Social History Social History   Socioeconomic History   Marital status: Married    Spouse name: Not on file   Number of children: Not on file   Years of education: Not on file   Highest education level: Not on file  Occupational History   Occupation: Retired    Associate Professor: RETIRED  Tobacco Use   Smoking status: Never   Smokeless tobacco: Never  Vaping Use   Vaping status: Never Used  Substance and Sexual Activity   Alcohol use: No    Alcohol/week: 0.0 standard drinks of alcohol   Drug use: No   Sexual activity: Not Currently    Birth control/protection: Post-menopausal  Other Topics Concern   Not on file  Social History Narrative   Retired-Domestic Assistant   Right handed    Lives with husband    Lives in a one story home   Social Determinants of Health   Financial Resource Strain: Low Risk  (06/04/2023)   Overall Financial Resource Strain (CARDIA)    Difficulty of Paying Living  Expenses: Not hard at all  Food Insecurity: No Food Insecurity (06/04/2023)   Hunger Vital Sign    Worried About Running Out of Food in the Last Year: Never true    Ran Out of Food in the Last Year: Never true  Transportation Needs: No Transportation  Needs (06/04/2023)   PRAPARE - Administrator, Civil Service (Medical): No    Lack of Transportation (Non-Medical): No  Physical Activity: Inactive (06/04/2023)   Exercise Vital Sign    Days of Exercise per Week: 0 days    Minutes of Exercise per Session: 0 min  Stress: No Stress Concern Present (06/04/2023)   [employer] of Occupational Health - Occupational Stress Questionnaire    Feeling of Stress : Not at all  Social Connections: Moderately Integrated (06/04/2023)   Social Connection and Isolation Panel [NHANES]    Frequency of Communication with Friends and Family: More than three times a week    Frequency of Social Gatherings with Friends and Family: Twice a week    Attends Religious Services: 1 to 4 times per year    Active Member of Clubs or Organizations: No    Attends Banker Meetings: Never    Marital Status: Married  Catering manager Violence: Not At Risk (06/04/2023)   Humiliation, Afraid, Rape, and Kick questionnaire    Fear of Current or Ex-Partner: No    Emotionally Abused: No    Physically Abused: No    Sexually Abused: No    Lab Results  Component Value Date   HGBA1C 8.5 (A) 06/23/2023   HGBA1C 8.2 (H) 04/14/2023   HGBA1C 7.7 (H) 10/15/2022   Lab Results  Component Value Date   CHOL 144 04/14/2023   Lab Results  Component Value Date   HDL 45.10 04/14/2023   Lab Results  Component Value Date   LDLCALC 66 04/14/2023   Lab Results  Component Value Date   TRIG 165.0 (H) 04/14/2023   Lab Results  Component Value Date   CHOLHDL 3 04/14/2023   Lab Results  Component Value Date   CREATININE 1.29 (H) 04/14/2023   Lab Results  Component Value Date   GFR 37.19 (L)  04/14/2023   Lab Results  Component Value Date   MICROALBUR 8.8 (H) 04/14/2023      Component Value Date/Time   NA 142 04/14/2023 0819   NA 139 11/07/2014 1001   K 4.0 04/14/2023 0819   K 4.6 11/07/2014 1001   CL 104 04/14/2023 0819   CO2 29 04/14/2023 0819   CO2 26 11/07/2014 1001   GLUCOSE 137 (H) 04/14/2023 0819   GLUCOSE 189 (H) 11/07/2014 1001   BUN 19 04/14/2023 0819   BUN 15.8 11/07/2014 1001   CREATININE 1.29 (H) 04/14/2023 0819   CREATININE 0.90 (H) 06/21/2020 1402   CREATININE 0.8 11/07/2014 1001   CALCIUM 10.1 04/14/2023 0819   CALCIUM 9.8 11/07/2014 1001   PROT 7.1 04/14/2023 0819   PROT 7.1 11/07/2014 1001   ALBUMIN 3.8 04/14/2023 0819   ALBUMIN 2.8 (L) 11/07/2014 1001   AST 17 04/14/2023 0819   AST 23 11/07/2014 1001   ALT 12 04/14/2023 0819   ALT 15 11/07/2014 1001   ALKPHOS 73 04/14/2023 0819   ALKPHOS 99 11/07/2014 1001   BILITOT 0.3 04/14/2023 0819   BILITOT 0.28 11/07/2014 1001   GFRNONAA 50 (L) 04/10/2023 1240   GFRNONAA 58 (L) 03/22/2020 1359   GFRAA 67 03/22/2020 1359      Latest Ref Rng & Units 04/14/2023    8:19 AM 04/10/2023   12:40 PM 10/15/2022   11:02 AM  BMP  Glucose 70 - 99 mg/dL 578  469  629   BUN 6 - 23 mg/dL 19  15    Creatinine 5.28 - 1.20 mg/dL 4.13  1.08    Sodium 135 - 145 mEq/L 142  139    Potassium 3.5 - 5.1 mEq/L 4.0  4.2    Chloride 96 - 112 mEq/L 104  104    CO2 19 - 32 mEq/L 29  26    Calcium 8.4 - 10.5 mg/dL 19.1  9.7         Component Value Date/Time   WBC 5.6 04/10/2023 1240   RBC 3.62 (L) 04/10/2023 1240   HGB 11.1 (L) 04/10/2023 1240   HGB 11.0 (L) 11/07/2014 1001   HCT 34.3 (L) 04/10/2023 1240   HCT 34.3 (L) 11/07/2014 1001   PLT 240 04/10/2023 1240   PLT 242 11/07/2014 1001   MCV 94.8 04/10/2023 1240   MCV 89.6 11/07/2014 1001   MCH 30.7 04/10/2023 1240   MCHC 32.4 04/10/2023 1240   RDW 12.2 04/10/2023 1240   RDW 21.5 (H) 11/07/2014 1001   LYMPHSABS 1.3 04/10/2023 1240   LYMPHSABS 0.4 (L) 11/07/2014  1001   MONOABS 0.4 04/10/2023 1240   MONOABS 0.8 11/07/2014 1001   EOSABS 0.1 04/10/2023 1240   EOSABS 0.2 11/07/2014 1001   BASOSABS 0.0 04/10/2023 1240   BASOSABS 0.0 11/07/2014 1001     Parts of this note may have been dictated using voice recognition software. There may be variances in spelling and vocabulary which are unintentional. Not all errors are proofread. Please notify the Thereasa Parkin if any discrepancies are noted or if the meaning of any statement is not clear.

## 2023-06-23 NOTE — Telephone Encounter (Signed)
Last refilled by Noemi Chapel, NP

## 2023-06-23 NOTE — Patient Instructions (Addendum)
NovoLog Mix 70/30 insulin: 15 units a.m. and 9 units before dinner-15 min before meals   _______________   Goals of DM therapy:  Morning Fasting blood sugar: 80-140  Blood sugar before meals: 80-140 Bed time blood sugar: 100-150  A1C <7%, limited only by hypoglycemia  1.Diabetes medications and their side effects discussed, including hypoglycemia    2. Check blood glucose:  a) Always check blood sugars before driving. Please see below (under hypoglycemia) on how to manage b) Check a minimum of 3 times/day or more as needed when having symptoms of hypoglycemia.   c) Try to check blood glucose before sleeping/in the middle of the night to ensure that it is remaining stable and not dropping less than 100 d) Check blood glucose more often if sick  3. Diet: a) 3 meals per day schedule b: Restrict carbs to 60-70 grams (4 servings) per meal c) Colorful vegetables - 3 servings a day, and low sugar fruit 2 servings/day Plate control method: 1/4 plate protein, 1/4 starch, 1/2 green, yellow, or red vegetables d) Avoid carbohydrate snacks unless hypoglycemic episode, or increased physical activity  4. Regular exercise as tolerated, preferably 3 or more hours a week  5. Hypoglycemia: a)  Do not drive or operate machinery without first testing blood glucose to assure it is over 90 mg%, or if dizzy, lightheaded, not feeling normal, etc, or  if foot or leg is numb or weak. b)  If blood glucose less than 70, take four 5gm Glucose tabs or 15-30 gm Glucose gel.  Repeat every 15 min as needed until blood sugar is >100 mg/dl. If hypoglycemia persists then call 911.   6. Sick day management: a) Check blood glucose more often b) Continue usual therapy if blood sugars are elevated.   7. Contact the doctor immediately if blood glucose is frequently <60 mg/dl, or an episode of severe hypoglycemia occurs (where someone had to give you glucose/  glucagon or if you passed out from a low blood glucose), or  if blood glucose is persistently >350 mg/dl, for further management  8. A change in level of physical activity or exercise and a change in diet may also affect your blood sugar. Check blood sugars more often and call if needed.  Instructions: 1. Bring glucose meter, blood glucose records on every visit for review 2. Continue to follow up with primary care physician and other providers for medical care 3. Yearly eye  and foot exam 4. Please get blood work done prior to the next appointment

## 2023-06-24 ENCOUNTER — Other Ambulatory Visit: Payer: Self-pay | Admitting: *Deleted

## 2023-06-24 DIAGNOSIS — K219 Gastro-esophageal reflux disease without esophagitis: Secondary | ICD-10-CM

## 2023-06-24 MED ORDER — PANTOPRAZOLE SODIUM 40 MG PO TBEC: 40.0000 mg | DELAYED_RELEASE_TABLET | Freq: Every day | ORAL | 3 refills | Status: DC

## 2023-06-26 NOTE — Telephone Encounter (Signed)
Please resend RX. The pharmacy did not receive it.

## 2023-06-27 ENCOUNTER — Encounter: Payer: Self-pay | Admitting: Family Medicine

## 2023-06-27 ENCOUNTER — Other Ambulatory Visit: Payer: Self-pay

## 2023-06-27 DIAGNOSIS — K219 Gastro-esophageal reflux disease without esophagitis: Secondary | ICD-10-CM

## 2023-06-27 MED ORDER — PANTOPRAZOLE SODIUM 40 MG PO TBEC: 40.0000 mg | DELAYED_RELEASE_TABLET | Freq: Every day | ORAL | 3 refills | Status: DC

## 2023-07-22 ENCOUNTER — Encounter: Payer: Self-pay | Admitting: "Endocrinology

## 2023-07-22 ENCOUNTER — Ambulatory Visit (INDEPENDENT_AMBULATORY_CARE_PROVIDER_SITE_OTHER): Payer: Medicare HMO | Admitting: "Endocrinology

## 2023-07-22 VITALS — BP 130/70 | HR 56 | Ht 60.0 in | Wt 124.6 lb

## 2023-07-22 DIAGNOSIS — E782 Mixed hyperlipidemia: Secondary | ICD-10-CM | POA: Diagnosis not present

## 2023-07-22 DIAGNOSIS — Z794 Long term (current) use of insulin: Secondary | ICD-10-CM | POA: Diagnosis not present

## 2023-07-22 DIAGNOSIS — E1165 Type 2 diabetes mellitus with hyperglycemia: Secondary | ICD-10-CM | POA: Diagnosis not present

## 2023-07-22 MED ORDER — NOVOLOG MIX 70/30 FLEXPEN (70-30) 100 UNIT/ML ~~LOC~~ SUPN: PEN_INJECTOR | SUBCUTANEOUS | 3 refills | Status: DC

## 2023-07-22 NOTE — Progress Notes (Signed)
 Outpatient Endocrinology Note Tammy Birmingham, MD  07/22/23   Tammy Hughes 1933-02-25 [id number]  Referring Provider: Kennyth Worth HERO, MD Primary Care Provider: Kennyth Worth HERO, MD Reason for consultation: Subjective   Assessment & Plan  Diagnoses and all orders for this visit:  Uncontrolled type 2 diabetes mellitus with hyperglycemia, with long-term current use of insulin  (HCC) -     insulin  aspart protamine - aspart (NOVOLOG  MIX 70/30 FLEXPEN) (70-30) 100 UNIT/ML FlexPen; Inject 17 Units into the skin daily with breakfast AND 9 Units daily with supper.   Diabetes Type II complicated by nephropathy  Lab Results  Component Value Date   GFR 37.19 (L) 04/14/2023   Hba1c goal less than 7, current Hba1c is  Lab Results  Component Value Date   HGBA1C 8.5 (A) 06/23/2023   Will recommend the following: NovoLog  Mix 70/30 insulin : 17-18 units a.m. and 9 units before dinner-30 min before meals  Previously changed the libre alarm low setting from 70 to 80 to allow patient to take snacks to avoid low blood sugar, instructed to keep snacks by bedside Fasting labs before next visit Ordered glucagon  on 04/21/2023 and discussed the use at length with patient and her accompanying attendant (daughter)  No known contraindications to any of above medications  -Last LD and Tg are as follows: Lab Results  Component Value Date   LDLCALC 66 04/14/2023    Lab Results  Component Value Date   TRIG 165.0 (H) 04/14/2023   -Recommend rosuvastatin  5 mg QD -Follow low fat diet and exercise   -Blood pressure goal <140/90 - Microalbumin/creatinine goal < 30 -Last MA/Cr is as follows: Lab Results  Component Value Date   MICROALBUR 8.8 (H) 04/14/2023   -not on ACE/ARB  -diet changes including salt restriction -limit eating outside -counseled BP targets per standards of diabetes care -uncontrolled blood pressure can lead to retinopathy, nephropathy and cardiovascular and atherosclerotic  heart disease  Reviewed and counseled on: -A1C target -Blood sugar targets -Complications of uncontrolled diabetes  -Checking blood sugar before meals and bedtime and bring log next visit -All medications with mechanism of action and side effects -Hypoglycemia management: rule of 15's, Glucagon  Emergency Kit and medical alert ID -low-carb low-fat plate-method diet -At least 20 minutes of physical activity per day -Annual dilated retinal eye exam and foot exam -compliance and follow up needs -follow up as scheduled or earlier if problem gets worse  Call if blood sugar is less than 70 or consistently above 250    Take a 15 gm snack of carbohydrate at bedtime before you go to sleep if your blood sugar is less than 100.    If you are going to fast after midnight for a test or procedure, ask your physician for instructions on how to reduce/decrease your insulin  dose.    Call if blood sugar is less than 70 or consistently above 250  -Treating a low sugar by rule of 15  (15 gms of sugar every 15 min until sugar is more than 70) If you feel your sugar is low, test your sugar to be sure If your sugar is low (less than 70), then take 15 grams of a fast acting Carbohydrate (3-4 glucose tablets or glucose gel or 4 ounces of juice or regular soda) Recheck your sugar 15 min after treating low to make sure it is more than 70 If sugar is still less than 70, treat again with 15 grams of carbohydrate  Don't drive the hour of hypoglycemia  If unconscious/unable to eat or drink by mouth, use glucagon  injection or nasal spray baqsimi and call 911. Can repeat again in 15 min if still unconscious.  Return in about 2 weeks (around 08/05/2023).   I have reviewed current medications, nurse's notes, allergies, vital signs, past medical and surgical history, family medical history, and social history for this encounter. Counseled patient on symptoms, examination findings, lab findings, imaging results,  treatment decisions and monitoring and prognosis. The patient understood the recommendations and agrees with the treatment plan. All questions regarding treatment plan were fully answered.  Tammy Birmingham, MD  07/22/23   History of Present Illness Tammy Hughes is a 89 y.o. year old female who presents for follow up of Type II diabetes mellitus.    Tammy Hughes was first diagnosed in 1995.   Diabetes education +  Diagnosis date: 1995  Current regimen: NovoLog  Mix 70/30 15 units and 9 units before dinner  Previous history:  Non-insulin  hypoglycemic drugs previously used:?  Metformin, detailed history not available Insulin  was started in 2005 Appears to have been on premixed insulin  for several years She is appears to have relatively stable control except since about early 2022  COMPLICATIONS -  MI/Stroke -  retinopathy -  neuropathy  + nephropathy  BLOOD SUGAR DATA  CGM interpretation: At today's visit, we reviewed her CGM downloads. The full report is scanned in the media. Reviewing the CGM trends, BG are elevated in the daytime after meals.   Physical Exam  BP 130/70   Pulse (!) 56   Ht 5' (1.524 m)   Wt 124 lb 9.6 oz (56.5 kg)   SpO2 99%   BMI 24.33 kg/m    Constitutional: well developed, well nourished Head: normocephalic, atraumatic Eyes: sclera anicteric, no redness Neck: supple Lungs: normal respiratory effort Neurology: alert and oriented Skin: dry, no appreciable rashes Musculoskeletal: no appreciable defects Psychiatric: normal mood and affect Diabetic Foot Exam - Simple   No data filed      Current Medications Patient's Medications  New Prescriptions   No medications on file  Previous Medications   ACETAMINOPHEN  (TYLENOL  PO)    Take 500 mg by mouth every 6 (six) hours as needed (Pain).   CLOPIDOGREL  (PLAVIX ) 75 MG TABLET    Take 1 tablet (75 mg total) by mouth daily.   CONTINUOUS GLUCOSE SENSOR (FREESTYLE LIBRE 2 SENSOR) MISC    CHANGE EVERY  14 DAYS   DONEPEZIL  (ARICEPT ) 10 MG TABLET    TAKE 1 TABLET BY MOUTH ONCE DAILY   ESCITALOPRAM  (LEXAPRO ) 5 MG TABLET    Take 1 tablet (5 mg total) by mouth daily.   GLUCAGON  (GVOKE HYPOPEN  1-PACK) 1 MG/0.2ML SOAJ    Inject 1 mg into the skin as needed (low blood sugar with impaired consciousness).   MECLIZINE  (ANTIVERT ) 25 MG TABLET    TAKE 1 TABLET BY MOUTH THREE TIMES DAILY IF NEEDED FOR DIZZINESS   MIRTAZAPINE  (REMERON ) 15 MG TABLET    TAKE 1 TABLET BY MOUTH AT BEDTIME   PANTOPRAZOLE  (PROTONIX ) 40 MG TABLET    Take 1 tablet (40 mg total) by mouth daily.   ROSUVASTATIN  (CRESTOR ) 5 MG TABLET    TAKE 1 TABLET BY MOUTH ONCE DAILY  Modified Medications   Modified Medication Previous Medication   INSULIN  ASPART PROTAMINE - ASPART (NOVOLOG  MIX 70/30 FLEXPEN) (70-30) 100 UNIT/ML FLEXPEN insulin  aspart protamine - aspart (NOVOLOG  MIX 70/30 FLEXPEN) (70-30) 100 UNIT/ML FlexPen  Inject 17 Units into the skin daily with breakfast AND 9 Units daily with supper.    Inject 15 Units into the skin daily with breakfast AND 9 Units daily with supper.  Discontinued Medications   No medications on file    Allergies Allergies  Allergen Reactions   Pirfenidone Diarrhea and Nausea And Vomiting   Aspirin      rash    Past Medical History Past Medical History:  Diagnosis Date   Allergy     SEASONAL   ANXIETY 03/08/2008   Cataract    REMOVED BILATERAL   Depression    DIVERTICULOSIS, COLON 03/08/2008   DVT (deep venous thrombosis) (HCC)    GERD 02/23/2007   HYPERCHOLESTEROLEMIA 02/01/2008   HYPERTENSION 03/08/2008   Insulin  dependent diabetes mellitus    OSTEOARTHRITIS 02/23/2007   OSTEOPOROSIS 02/23/2007   PERIPHERAL NEUROPATHY 02/23/2007   STD (sexually transmitted disease)    Stomach cancer (HCC) 07/20/14   invasive adenocarcinoma w/signet rings features   Urinary incontinence    Urolithiasis     Past Surgical History Past Surgical History:  Procedure Laterality Date   ABDOMINAL SURGERY      CARDIAC CATHETERIZATION N/A 02/02/2016   Procedure: Left Heart Cath and Coronary Angiography;  Surgeon: Peter M Jordan, MD;  Location: [facility] INVASIVE CV LAB;  Service: Cardiovascular;  Laterality: N/A;   CHOLECYSTECTOMY  1995   ESOPHAGOGASTRODUODENOSCOPY  06/08/2002   GASTRECTOMY N/A 07/20/2014   Procedure: PROXIMAL GASTRECTOMY;  Surgeon: Jina Nephew, MD;  Location: [facility] OR;  Service: General;  Laterality: N/A;   GASTROJEJUNOSTOMY N/A 07/20/2014   Procedure: GASTROJEJUNOSTOMY;  Surgeon: Jina Nephew, MD;  Location: [facility] OR;  Service: General;  Laterality: N/A;   LAPAROSCOPY N/A 07/20/2014   Procedure: LAPAROSCOPY DIAGNOSTIC;  Surgeon: Jina Nephew, MD;  Location: [facility] OR;  Service: General;  Laterality: N/A;    Family History family history includes Cancer in her father, sister, and sister; Diabetes in her sister and sister; Other in her mother.  Social History Social History   Socioeconomic History   Marital status: Married    Spouse name: Not on file   Number of children: Not on file   Years of education: Not on file   Highest education level: Not on file  Occupational History   Occupation: Retired    Associate Professor: RETIRED  Tobacco Use   Smoking status: Never   Smokeless tobacco: Never  Vaping Use   Vaping status: Never Used  Substance and Sexual Activity   Alcohol use: No    Alcohol/week: 0.0 standard drinks of alcohol   Drug use: No   Sexual activity: Not Currently    Birth control/protection: Post-menopausal  Other Topics Concern   Not on file  Social History Narrative   Retired-Domestic Assistant   Right handed    Lives with husband    Lives in a one story home   Social Drivers of Health   Financial Resource Strain: Low Risk  (06/04/2023)   Overall Financial Resource Strain (CARDIA)    Difficulty of Paying Living Expenses: Not hard at all  Food Insecurity: No Food Insecurity (06/04/2023)   Hunger Vital Sign    Worried About Running Out of Food in the Last Year: Never true     Ran Out of Food in the Last Year: Never true  Transportation Needs: No Transportation Needs (06/04/2023)   PRAPARE - Administrator, Civil Service (Medical): No    Lack of Transportation (Non-Medical): No  Physical Activity: Inactive (06/04/2023)   Exercise  Vital Sign    Days of Exercise per Week: 0 days    Minutes of Exercise per Session: 0 min  Stress: No Stress Concern Present (06/04/2023)   [employer] of Occupational Health - Occupational Stress Questionnaire    Feeling of Stress : Not at all  Social Connections: Moderately Integrated (06/04/2023)   Social Connection and Isolation Panel [NHANES]    Frequency of Communication with Friends and Family: More than three times a week    Frequency of Social Gatherings with Friends and Family: Twice a week    Attends Religious Services: 1 to 4 times per year    Active Member of Clubs or Organizations: No    Attends Banker Meetings: Never    Marital Status: Married  Catering Manager Violence: Not At Risk (06/04/2023)   Humiliation, Afraid, Rape, and Kick questionnaire    Fear of Current or Ex-Partner: No    Emotionally Abused: No    Physically Abused: No    Sexually Abused: No    Lab Results  Component Value Date   HGBA1C 8.5 (A) 06/23/2023   HGBA1C 8.2 (H) 04/14/2023   HGBA1C 7.7 (H) 10/15/2022   Lab Results  Component Value Date   CHOL 144 04/14/2023   Lab Results  Component Value Date   HDL 45.10 04/14/2023   Lab Results  Component Value Date   LDLCALC 66 04/14/2023   Lab Results  Component Value Date   TRIG 165.0 (H) 04/14/2023   Lab Results  Component Value Date   CHOLHDL 3 04/14/2023   Lab Results  Component Value Date   CREATININE 1.29 (H) 04/14/2023   Lab Results  Component Value Date   GFR 37.19 (L) 04/14/2023   Lab Results  Component Value Date   MICROALBUR 8.8 (H) 04/14/2023      Component Value Date/Time   NA 142 04/14/2023 0819   NA 139 11/07/2014 1001   K  4.0 04/14/2023 0819   K 4.6 11/07/2014 1001   CL 104 04/14/2023 0819   CO2 29 04/14/2023 0819   CO2 26 11/07/2014 1001   GLUCOSE 137 (H) 04/14/2023 0819   GLUCOSE 189 (H) 11/07/2014 1001   BUN 19 04/14/2023 0819   BUN 15.8 11/07/2014 1001   CREATININE 1.29 (H) 04/14/2023 0819   CREATININE 0.90 (H) 06/21/2020 1402   CREATININE 0.8 11/07/2014 1001   CALCIUM  10.1 04/14/2023 0819   CALCIUM  9.8 11/07/2014 1001   PROT 7.1 04/14/2023 0819   PROT 7.1 11/07/2014 1001   ALBUMIN 3.8 04/14/2023 0819   ALBUMIN 2.8 (L) 11/07/2014 1001   AST 17 04/14/2023 0819   AST 23 11/07/2014 1001   ALT 12 04/14/2023 0819   ALT 15 11/07/2014 1001   ALKPHOS 73 04/14/2023 0819   ALKPHOS 99 11/07/2014 1001   BILITOT 0.3 04/14/2023 0819   BILITOT 0.28 11/07/2014 1001   GFRNONAA 50 (L) 04/10/2023 1240   GFRNONAA 58 (L) 03/22/2020 1359   GFRAA 67 03/22/2020 1359      Latest Ref Rng & Units 04/14/2023    8:19 AM 04/10/2023   12:40 PM 10/15/2022   11:02 AM  BMP  Glucose 70 - 99 mg/dL 862  755  831   BUN 6 - 23 mg/dL 19  15    Creatinine 9.59 - 1.20 mg/dL 8.70  8.91    Sodium 864 - 145 mEq/L 142  139    Potassium 3.5 - 5.1 mEq/L 4.0  4.2    Chloride 96 - 112  mEq/L 104  104    CO2 19 - 32 mEq/L 29  26    Calcium  8.4 - 10.5 mg/dL 89.8  9.7         Component Value Date/Time   WBC 5.6 04/10/2023 1240   RBC 3.62 (L) 04/10/2023 1240   HGB 11.1 (L) 04/10/2023 1240   HGB 11.0 (L) 11/07/2014 1001   HCT 34.3 (L) 04/10/2023 1240   HCT 34.3 (L) 11/07/2014 1001   PLT 240 04/10/2023 1240   PLT 242 11/07/2014 1001   MCV 94.8 04/10/2023 1240   MCV 89.6 11/07/2014 1001   MCH 30.7 04/10/2023 1240   MCHC 32.4 04/10/2023 1240   RDW 12.2 04/10/2023 1240   RDW 21.5 (H) 11/07/2014 1001   LYMPHSABS 1.3 04/10/2023 1240   LYMPHSABS 0.4 (L) 11/07/2014 1001   MONOABS 0.4 04/10/2023 1240   MONOABS 0.8 11/07/2014 1001   EOSABS 0.1 04/10/2023 1240   EOSABS 0.2 11/07/2014 1001   BASOSABS 0.0 04/10/2023 1240   BASOSABS  0.0 11/07/2014 1001     Parts of this note may have been dictated using voice recognition software. There may be variances in spelling and vocabulary which are unintentional. Not all errors are proofread. Please notify the dino if any discrepancies are noted or if the meaning of any statement is not clear.

## 2023-07-22 NOTE — Patient Instructions (Signed)

## 2023-08-05 ENCOUNTER — Ambulatory Visit (INDEPENDENT_AMBULATORY_CARE_PROVIDER_SITE_OTHER): Payer: Medicare PPO | Admitting: "Endocrinology

## 2023-08-05 ENCOUNTER — Encounter: Payer: Self-pay | Admitting: "Endocrinology

## 2023-08-05 VITALS — BP 122/78 | HR 55 | Temp 98.5°F | Wt 128.0 lb

## 2023-08-05 DIAGNOSIS — E1165 Type 2 diabetes mellitus with hyperglycemia: Secondary | ICD-10-CM

## 2023-08-05 DIAGNOSIS — E782 Mixed hyperlipidemia: Secondary | ICD-10-CM | POA: Diagnosis not present

## 2023-08-05 DIAGNOSIS — Z794 Long term (current) use of insulin: Secondary | ICD-10-CM | POA: Diagnosis not present

## 2023-08-05 NOTE — Patient Instructions (Signed)

## 2023-08-05 NOTE — Progress Notes (Signed)
Outpatient Endocrinology Note Tammy [place], MD  08/05/23   Tammy Hughes [birth year] [id number]  Referring Provider: Ardith Dark, MD Primary Care Provider: Ardith Dark, MD Reason for consultation: Subjective   Assessment & Plan  Lakayla was seen today for follow-up.  Diagnoses and all orders for this visit:  Uncontrolled type 2 diabetes mellitus with hyperglycemia, with long-term current use of insulin (HCC)  Long-term insulin use (HCC)  Mixed hypercholesterolemia and hypertriglyceridemia    Diabetes Type II complicated by nephropathy  Lab Results  Component Value Date   GFR 37.19 (L) 04/14/2023   Hba1c goal less than 7, current Hba1c is  Lab Results  Component Value Date   HGBA1C 8.5 (A) 06/23/2023   Will recommend the following: NovoLog Mix 70/30 insulin: 17-18 units a.m. and 9 units before dinner-30 min before meals  Previously changed the libre alarm low setting from 70 to 80 to allow patient to take snacks to avoid low blood sugar, instructed to keep snacks by bedside Fasting labs before next visit Ordered glucagon on 04/21/2023 and discussed the use at length with patient and her accompanying attendant (daughter)  No known contraindications to any of above medications  -Last LD and Tg are as follows: Lab Results  Component Value Date   [facility] 66 04/14/2023    Lab Results  Component Value Date   TRIG 165.0 (H) 04/14/2023   -Recommend rosuvastatin 5 mg QD -Follow low fat diet and exercise   -Blood pressure goal <140/90 - Microalbumin/creatinine goal < 30 -Last MA/Cr is as follows: Lab Results  Component Value Date   MICROALBUR 8.8 (H) 04/14/2023   -not on ACE/ARB  -diet changes including salt restriction -limit eating outside -counseled BP targets per standards of diabetes care -uncontrolled blood pressure can lead to retinopathy, nephropathy and cardiovascular and atherosclerotic heart disease  Reviewed and counseled on: -A1C  target -Blood sugar targets -Complications of uncontrolled diabetes  -Checking blood sugar before meals and bedtime and bring log next visit -All medications with mechanism of action and side effects -Hypoglycemia management: rule of 15's, Glucagon Emergency Kit and medical alert ID -low-carb low-fat plate-method diet -At least 20 minutes of physical activity per day -Annual dilated retinal eye exam and foot exam -compliance and follow up needs -follow up as scheduled or earlier if problem gets worse  Call if blood sugar is less than 70 or consistently above 250    Take a 15 gm snack of carbohydrate at bedtime before you go to sleep if your blood sugar is less than 100.    If you are going to fast after midnight for a test or procedure, ask your physician for instructions on how to reduce/decrease your insulin dose.    Call if blood sugar is less than 70 or consistently above 250  -Treating a low sugar by rule of 15  (15 gms of sugar every 15 min until sugar is more than 70) If you feel your sugar is low, test your sugar to be sure If your sugar is low (less than 70), then take 15 grams of a fast acting Carbohydrate (3-4 glucose tablets or glucose gel or 4 ounces of juice or regular soda) Recheck your sugar 15 min after treating low to make sure it is more than 70 If sugar is still less than 70, treat again with 15 grams of carbohydrate          Don't drive the hour of hypoglycemia  If unconscious/unable to eat  or drink by mouth, use glucagon injection or nasal spray baqsimi and call 911. Can repeat again in 15 min if still unconscious.  Return in about 3 months (around 11/03/2023).   I have reviewed current medications, nurse's notes, allergies, vital signs, past medical and surgical history, family medical history, and social history for this encounter. Counseled patient on symptoms, examination findings, lab findings, imaging results, treatment decisions and monitoring and prognosis.  The patient understood the recommendations and agrees with the treatment plan. All questions regarding treatment plan were fully answered.  Tammy Littleton, MD  08/05/23   History of Present Illness Tammy Hughes is a 89 y.o. year old female who presents for follow up of Type II diabetes mellitus.  Tammy Hughes was first diagnosed in 1995.   Diabetes education +  Diagnosis date: 1995  Current regimen: NovoLog Mix 70/30 insulin: 17 units a.m. and 9 units before dinner-30 min before meals   Previous history:  Non-insulin hypoglycemic drugs previously used:?  Metformin, detailed history not available Insulin was started in 2005 Appears to have been on premixed insulin for several years She is appears to have relatively stable control except since about early 2022  COMPLICATIONS -  MI/Stroke -  retinopathy -  neuropathy  + nephropathy  BLOOD SUGAR DATA  CGM interpretation: At today's visit, we reviewed her CGM downloads. The full report is scanned in the media. Reviewing the CGM trends, BG are mildly elevated after dinner.   Physical Exam  BP 122/78   Pulse (!) 55   Temp 98.5 F (36.9 C) (Oral)   Wt 128 lb (58.1 kg)   SpO2 96%   BMI 25.00 kg/m    Constitutional: well developed, well nourished Head: normocephalic, atraumatic Eyes: sclera anicteric, no redness Neck: supple Lungs: normal respiratory effort Neurology: alert and oriented Skin: dry, no appreciable rashes Musculoskeletal: no appreciable defects Psychiatric: normal mood and affect Diabetic Foot Exam - Simple   No data filed      Current Medications Patient's Medications  New Prescriptions   No medications on file  Previous Medications   ACETAMINOPHEN (TYLENOL PO)    Take 500 mg by mouth every 6 (six) hours as needed (Pain).   CLOPIDOGREL (PLAVIX) 75 MG TABLET    Take 1 tablet (75 mg total) by mouth daily.   CONTINUOUS GLUCOSE SENSOR (FREESTYLE LIBRE 2 SENSOR) MISC    CHANGE EVERY 14 DAYS    DONEPEZIL (ARICEPT) 10 MG TABLET    TAKE 1 TABLET BY MOUTH ONCE DAILY   ESCITALOPRAM (LEXAPRO) 5 MG TABLET    Take 1 tablet (5 mg total) by mouth daily.   GLUCAGON (GVOKE HYPOPEN 1-PACK) 1 MG/0.2ML SOAJ    Inject 1 mg into the skin as needed (low blood sugar with impaired consciousness).   INSULIN ASPART PROTAMINE - ASPART (NOVOLOG MIX 70/30 FLEXPEN) (70-30) 100 UNIT/ML FLEXPEN    Inject 17 Units into the skin daily with breakfast AND 9 Units daily with supper.   MECLIZINE (ANTIVERT) 25 MG TABLET    TAKE 1 TABLET BY MOUTH THREE TIMES DAILY IF NEEDED FOR DIZZINESS   MIRTAZAPINE (REMERON) 15 MG TABLET    TAKE 1 TABLET BY MOUTH AT BEDTIME   PANTOPRAZOLE (PROTONIX) 40 MG TABLET    Take 1 tablet (40 mg total) by mouth daily.   ROSUVASTATIN (CRESTOR) 5 MG TABLET    TAKE 1 TABLET BY MOUTH ONCE DAILY  Modified Medications   No medications on file  Discontinued Medications  No medications on file    Allergies Allergies  Allergen Reactions   Pirfenidone Diarrhea and Nausea And Vomiting   Aspirin     rash    Past Medical History Past Medical History:  Diagnosis Date   Allergy    SEASONAL   ANXIETY 03/08/2008   Cataract    REMOVED BILATERAL   Depression    DIVERTICULOSIS, COLON 03/08/2008   DVT (deep venous thrombosis) (HCC)    GERD 02/23/2007   HYPERCHOLESTEROLEMIA 02/01/2008   HYPERTENSION 03/08/2008   Insulin dependent diabetes mellitus    OSTEOARTHRITIS 02/23/2007   OSTEOPOROSIS 02/23/2007   PERIPHERAL NEUROPATHY 02/23/2007   STD (sexually transmitted disease)    Stomach cancer (HCC) 07/20/14   invasive adenocarcinoma w/signet rings features   Urinary incontinence    Urolithiasis     Past Surgical History Past Surgical History:  Procedure Laterality Date   ABDOMINAL SURGERY     CARDIAC CATHETERIZATION N/A 02/02/2016   Procedure: Left Heart Cath and Coronary Angiography;  Surgeon: Peter M [place], MD;  Location: [facility] INVASIVE CV LAB;  Service: Cardiovascular;  Laterality: N/A;    CHOLECYSTECTOMY  1995   ESOPHAGOGASTRODUODENOSCOPY  06/08/2002   GASTRECTOMY N/A 07/20/2014   Procedure: PROXIMAL GASTRECTOMY;  Surgeon: Almond Lint, MD;  Location: [facility] OR;  Service: General;  Laterality: N/A;   GASTROJEJUNOSTOMY N/A 07/20/2014   Procedure: GASTROJEJUNOSTOMY;  Surgeon: Almond Lint, MD;  Location: [facility] OR;  Service: General;  Laterality: N/A;   LAPAROSCOPY N/A 07/20/2014   Procedure: LAPAROSCOPY DIAGNOSTIC;  Surgeon: Almond Lint, MD;  Location: [facility] OR;  Service: General;  Laterality: N/A;    Family History family history includes Cancer in her father, sister, and sister; Diabetes in her sister and sister; Other in her mother.  Social History Social History   Socioeconomic History   Marital status: Married    Spouse name: Not on file   Number of children: Not on file   Years of education: Not on file   Highest education level: Not on file  Occupational History   Occupation: Retired    Associate Professor: RETIRED  Tobacco Use   Smoking status: Never   Smokeless tobacco: Never  Vaping Use   Vaping status: Never Used  Substance and Sexual Activity   Alcohol use: No    Alcohol/week: 0.0 standard drinks of alcohol   Drug use: No   Sexual activity: Not Currently    Birth control/protection: Post-menopausal  Other Topics Concern   Not on file  Social History Narrative   Retired-Domestic Assistant   Right handed    Lives with husband    Lives in a one story home   Social Drivers of Health   Financial Resource Strain: Low Risk  (06/04/2023)   Overall Financial Resource Strain (CARDIA)    Difficulty of Paying Living Expenses: Not hard at all  Food Insecurity: No Food Insecurity (06/04/2023)   Hunger Vital Sign    Worried About Running Out of Food in the Last Year: Never true    Ran Out of Food in the Last Year: Never true  Transportation Needs: No Transportation Needs (06/04/2023)   PRAPARE - Administrator, Civil Service (Medical): No    Lack of Transportation  (Non-Medical): No  Physical Activity: Inactive (06/04/2023)   Exercise Vital Sign    Days of Exercise per Week: 0 days    Minutes of Exercise per Session: 0 min  Stress: No Stress Concern Present (06/04/2023)   [employer] of Occupational Health - Occupational  Stress Questionnaire    Feeling of Stress : Not at all  Social Connections: Moderately Integrated (06/04/2023)   Social Connection and Isolation Panel [NHANES]    Frequency of Communication with Friends and Family: More than three times a week    Frequency of Social Gatherings with Friends and Family: Twice a week    Attends Religious Services: 1 to 4 times per year    Active Member of Clubs or Organizations: No    Attends Banker Meetings: Never    Marital Status: Married  Catering manager Violence: Not At Risk (06/04/2023)   Humiliation, Afraid, Rape, and Kick questionnaire    Fear of Current or Ex-Partner: No    Emotionally Abused: No    Physically Abused: No    Sexually Abused: No    Lab Results  Component Value Date   HGBA1C 8.5 (A) 06/23/2023   HGBA1C 8.2 (H) 04/14/2023   HGBA1C 7.7 (H) 10/15/2022   Lab Results  Component Value Date   CHOL 144 04/14/2023   Lab Results  Component Value Date   HDL 45.10 04/14/2023   Lab Results  Component Value Date   LDLCALC 66 04/14/2023   Lab Results  Component Value Date   TRIG 165.0 (H) 04/14/2023   Lab Results  Component Value Date   CHOLHDL 3 04/14/2023   Lab Results  Component Value Date   CREATININE 1.29 (H) 04/14/2023   Lab Results  Component Value Date   GFR 37.19 (L) 04/14/2023   Lab Results  Component Value Date   MICROALBUR 8.8 (H) 04/14/2023      Component Value Date/Time   NA 142 04/14/2023 0819   NA 139 11/07/2014 1001   K 4.0 04/14/2023 0819   K 4.6 11/07/2014 1001   CL 104 04/14/2023 0819   CO2 29 04/14/2023 0819   CO2 26 11/07/2014 1001   GLUCOSE 137 (H) 04/14/2023 0819   GLUCOSE 189 (H) 11/07/2014 1001   BUN 19  04/14/2023 0819   BUN 15.8 11/07/2014 1001   CREATININE 1.29 (H) 04/14/2023 0819   CREATININE 0.90 (H) 06/21/2020 1402   CREATININE 0.8 11/07/2014 1001   CALCIUM 10.1 04/14/2023 0819   CALCIUM 9.8 11/07/2014 1001   PROT 7.1 04/14/2023 0819   PROT 7.1 11/07/2014 1001   ALBUMIN 3.8 04/14/2023 0819   ALBUMIN 2.8 (L) 11/07/2014 1001   AST 17 04/14/2023 0819   AST 23 11/07/2014 1001   ALT 12 04/14/2023 0819   ALT 15 11/07/2014 1001   ALKPHOS 73 04/14/2023 0819   ALKPHOS 99 11/07/2014 1001   BILITOT 0.3 04/14/2023 0819   BILITOT 0.28 11/07/2014 1001   GFRNONAA 50 (L) 04/10/2023 1240   GFRNONAA 58 (L) 03/22/2020 1359   GFRAA 67 03/22/2020 1359      Latest Ref Rng & Units 04/14/2023    8:19 AM 04/10/2023   12:40 PM 10/15/2022   11:02 AM  BMP  Glucose 70 - 99 mg/dL 098  119  147   BUN 6 - 23 mg/dL 19  15    Creatinine 8.29 - 1.20 mg/dL 5.62  1.30    Sodium 865 - 145 mEq/L 142  139    Potassium 3.5 - 5.1 mEq/L 4.0  4.2    Chloride 96 - 112 mEq/L 104  104    CO2 19 - 32 mEq/L 29  26    Calcium 8.4 - 10.5 mg/dL 78.4  9.7         Component Value Date/Time  WBC 5.6 04/10/2023 1240   RBC 3.62 (L) 04/10/2023 1240   HGB 11.1 (L) 04/10/2023 1240   HGB 11.0 (L) 11/07/2014 1001   HCT 34.3 (L) 04/10/2023 1240   HCT 34.3 (L) 11/07/2014 1001   PLT 240 04/10/2023 1240   PLT 242 11/07/2014 1001   MCV 94.8 04/10/2023 1240   MCV 89.6 11/07/2014 1001   MCH 30.7 04/10/2023 1240   MCHC 32.4 04/10/2023 1240   RDW 12.2 04/10/2023 1240   RDW 21.5 (H) 11/07/2014 1001   LYMPHSABS 1.3 04/10/2023 1240   LYMPHSABS 0.4 (L) 11/07/2014 1001   MONOABS 0.4 04/10/2023 1240   MONOABS 0.8 11/07/2014 1001   EOSABS 0.1 04/10/2023 1240   EOSABS 0.2 11/07/2014 1001   BASOSABS 0.0 04/10/2023 1240   BASOSABS 0.0 11/07/2014 1001     Parts of this note may have been dictated using voice recognition software. There may be variances in spelling and vocabulary which are unintentional. Not all errors are  proofread. Please notify the Thereasa Parkin if any discrepancies are noted or if the meaning of any statement is not clear.

## 2023-08-07 ENCOUNTER — Other Ambulatory Visit: Payer: Self-pay | Admitting: Physician Assistant

## 2023-08-07 ENCOUNTER — Encounter: Payer: Self-pay | Admitting: Family Medicine

## 2023-08-07 ENCOUNTER — Encounter: Payer: Self-pay | Admitting: "Endocrinology

## 2023-08-08 ENCOUNTER — Telehealth: Payer: Self-pay | Admitting: *Deleted

## 2023-08-08 ENCOUNTER — Other Ambulatory Visit: Payer: Self-pay

## 2023-08-08 MED ORDER — DONEPEZIL HCL 10 MG PO TABS: ORAL_TABLET | ORAL | 0 refills | Status: DC

## 2023-08-08 NOTE — Telephone Encounter (Signed)
Patient has requested a refill of Aricept from this office. Per pcp, request needed to be sent to neurology.

## 2023-08-08 NOTE — Telephone Encounter (Signed)
Sent 90 tablets in to pharmacy, she will schedule an appt.

## 2023-08-08 NOTE — Telephone Encounter (Signed)
Pls let them know she needs to schedule f/u for continued refills from our office, ok to send refills until her next f/u, thanks

## 2023-08-08 NOTE — Telephone Encounter (Signed)
This needs to go to her neurologist.   Katina Degree. Jimmey Ralph, MD 08/08/2023 9:58 AM

## 2023-08-12 ENCOUNTER — Ambulatory Visit (INDEPENDENT_AMBULATORY_CARE_PROVIDER_SITE_OTHER): Payer: Medicare PPO | Admitting: Family Medicine

## 2023-08-12 ENCOUNTER — Ambulatory Visit: Payer: Medicare PPO | Admitting: Neurology

## 2023-08-12 ENCOUNTER — Encounter: Payer: Self-pay | Admitting: Family Medicine

## 2023-08-12 VITALS — BP 112/65 | HR 61 | Temp 98.4°F | Ht 60.0 in | Wt 126.8 lb

## 2023-08-12 DIAGNOSIS — E1169 Type 2 diabetes mellitus with other specified complication: Secondary | ICD-10-CM

## 2023-08-12 DIAGNOSIS — E785 Hyperlipidemia, unspecified: Secondary | ICD-10-CM

## 2023-08-12 DIAGNOSIS — I152 Hypertension secondary to endocrine disorders: Secondary | ICD-10-CM | POA: Diagnosis not present

## 2023-08-12 DIAGNOSIS — E1142 Type 2 diabetes mellitus with diabetic polyneuropathy: Secondary | ICD-10-CM

## 2023-08-12 DIAGNOSIS — E1159 Type 2 diabetes mellitus with other circulatory complications: Secondary | ICD-10-CM | POA: Diagnosis not present

## 2023-08-12 DIAGNOSIS — F325 Major depressive disorder, single episode, in full remission: Secondary | ICD-10-CM

## 2023-08-12 DIAGNOSIS — M1990 Unspecified osteoarthritis, unspecified site: Secondary | ICD-10-CM | POA: Diagnosis not present

## 2023-08-12 DIAGNOSIS — Z0001 Encounter for general adult medical examination with abnormal findings: Secondary | ICD-10-CM

## 2023-08-12 LAB — CBC
HCT: 38.1 % (ref 36.0–46.0)
Hemoglobin: 12.2 g/dL (ref 12.0–15.0)
MCHC: 32.1 g/dL (ref 30.0–36.0)
MCV: 93.7 fL (ref 78.0–100.0)
Platelets: 262 10*3/uL (ref 150.0–400.0)
RBC: 4.07 Mil/uL (ref 3.87–5.11)
RDW: 13.5 % (ref 11.5–15.5)
WBC: 4.2 10*3/uL (ref 4.0–10.5)

## 2023-08-12 LAB — COMPREHENSIVE METABOLIC PANEL
ALT: 15 U/L (ref 0–35)
AST: 21 U/L (ref 0–37)
Albumin: 3.9 g/dL (ref 3.5–5.2)
Alkaline Phosphatase: 73 U/L (ref 39–117)
BUN: 18 mg/dL (ref 6–23)
CO2: 26 meq/L (ref 19–32)
Calcium: 10.2 mg/dL (ref 8.4–10.5)
Chloride: 105 meq/L (ref 96–112)
Creatinine, Ser: 1.07 mg/dL (ref 0.40–1.20)
GFR: 46.44 mL/min — ABNORMAL LOW (ref >60.00)
Glucose, Bld: 251 mg/dL — ABNORMAL HIGH (ref 70–99)
Potassium: 4 meq/L (ref 3.5–5.1)
Sodium: 138 meq/L (ref 135–145)
Total Bilirubin: 0.3 mg/dL (ref 0.2–1.2)
Total Protein: 7.2 g/dL (ref 6.0–8.3)

## 2023-08-12 LAB — LIPID PANEL
Cholesterol: 126 mg/dL (ref 0–200)
HDL: 44.7 mg/dL (ref >39.00)
LDL Cholesterol: 39 mg/dL (ref 0–99)
NonHDL: 80.83
Total CHOL/HDL Ratio: 3
Triglycerides: 211 mg/dL — ABNORMAL HIGH (ref 0.0–149.0)
VLDL: 42.2 mg/dL — ABNORMAL HIGH (ref 0.0–40.0)

## 2023-08-12 LAB — TSH: TSH: 0.93 u[IU]/mL (ref 0.35–5.50)

## 2023-08-12 NOTE — Patient Instructions (Signed)
It was very nice to see you today!  We will check blood work today.  Please continue to stay active and eat healthy.  Please monitor your blood pressure at home and let us know if it is persistently elevated.  Return in about 6 months (around 02/09/2024) for Follow Up.   Take care, Dr Jimmey Ralph  PLEASE NOTE:  If you had any lab tests, please let us know if you have not heard back within a few days. You may see your results on mychart before we have a chance to review them but we will give you a call once they are reviewed by [place].   If we ordered any referrals today, please let us know if you have not heard from their office within the next week.   If you had any urgent prescriptions sent in today, please check with the pharmacy within an hour of our visit to make sure the prescription was transmitted appropriately.   Please try these tips to maintain a healthy lifestyle:  Eat at least 3 REAL meals and 1-2 snacks per day.  Aim for no more than 5 hours between eating.  If you eat breakfast, please do so within one hour of getting up.   Each meal should contain half fruits/vegetables, one quarter protein, and one quarter carbs (no bigger than a computer mouse)  Cut down on sweet beverages. This includes juice, soda, and sweet tea.   Drink at least 1 glass of water with each meal and aim for at least 8 glasses per day  Exercise at least 150 minutes every week.

## 2023-08-12 NOTE — Progress Notes (Signed)
Chief Complaint:  Tammy Hughes is a 89 y.o. female who presents today for her annual comprehensive physical exam.    Assessment/Plan:  Chronic Problems Addressed Today: Hypertension associated with diabetes (HCC) Initially elevated however at goal on recheck.  May have some element of whitecoat hypertension.  She will continue to monitor at home and let us know if persistently elevated.  We discussed lifestyle modifications.  Osteoarthritis Overall symptoms are stable though still having quite a bit of pain in bilateral knees.  She can use over-the-counter analgesics as needed.  We did discuss referral to sports medicine however she declined.  She will let us know if symptoms become more problematic.  Dyslipidemia associated with type 2 diabetes mellitus (HCC) On Crestor 5 mg daily.  Check lipids.  Type 2 diabetes mellitus with peripheral neuropathy [facility]) Following with endocrinology.   Depression, major, in remission (HCC) Stable on Lexapro 10 mg daily and Remeron 15 mg nightly.  Preventative Healthcare: Check labs. UpToDate on vaccines.   Patient Counseling(The following topics were reviewed and/or handout was given):  -Nutrition: Stressed importance of moderation in sodium/caffeine intake, saturated fat and cholesterol, caloric balance, sufficient intake of fresh fruits, vegetables, and fiber.  -Stressed the importance of regular exercise.   -Substance Abuse: Discussed cessation/primary prevention of tobacco, alcohol, or other drug use; driving or other dangerous activities under the influence; availability of treatment for abuse.   -Injury prevention: Discussed safety belts, safety helmets, smoke detector, smoking near bedding or upholstery.   -Sexuality: Discussed sexually transmitted diseases, partner selection, use of condoms, avoidance of unintended pregnancy and contraceptive alternatives.   -Dental health: Discussed importance of regular tooth brushing, flossing, and  dental visits.  -Health maintenance and immunizations reviewed. Please refer to Health maintenance section.  Return to care in 1 year for next preventative visit.     Subjective:  HPI:  She has no acute complaints today.  See A/P for status of chronic conditions.  Lifestyle Diet: None specific.  Exercise: None specific.      08/12/2023    1:18 PM  Depression screen PHQ 2/9  Decreased Interest 0  Down, Depressed, Hopeless 0  PHQ - 2 Score 0    There are no preventive care reminders to display for this patient.   ROS: Per HPI, otherwise a complete review of systems was negative.   PMH:  The following were reviewed and entered/updated in epic: Past Medical History:  Diagnosis Date   Allergy    SEASONAL   ANXIETY 03/08/2008   Cataract    REMOVED BILATERAL   Depression    DIVERTICULOSIS, COLON 03/08/2008   DVT (deep venous thrombosis) (HCC)    GERD 02/23/2007   HYPERCHOLESTEROLEMIA 02/01/2008   HYPERTENSION 03/08/2008   Insulin dependent diabetes mellitus    OSTEOARTHRITIS 02/23/2007   OSTEOPOROSIS 02/23/2007   PERIPHERAL NEUROPATHY 02/23/2007   STD (sexually transmitted disease)    Stomach cancer (HCC) 07/20/14   invasive adenocarcinoma w/signet rings features   Urinary incontinence    Urolithiasis    Patient Active Problem List   Diagnosis Date Noted   Depression, major, in remission (HCC) 02/04/2023   Chest pain 07/26/2021   Mild dementia (HCC) 03/21/2021   Insomnia 02/05/2021   Uterine prolapse 01/10/2020   Vitamin D deficiency 03/23/2019   Genital herpes 08/19/2018   CAD (coronary artery disease) 08/13/2018   TIA (transient ischemic attack) 08/13/2018   Normocytic anemia 02/01/2016   Memory impairment 08/02/2015   DVT (deep venous thrombosis) (HCC) 10/27/2014  Type 2 diabetes mellitus with peripheral neuropathy (HCC) 08/04/2014   NSVT post-op 07/24/2014   Gastric cancer-s/p gastrectomy 07/20/14 06/15/2014   IPF (idiopathic pulmonary fibrosis) (HCC)  08/06/2013   NASH (nonalcoholic steatohepatitis) 10/21/2011   BACK PAIN, LUMBAR 12/19/2009   Hypertension associated with diabetes (HCC) 03/08/2008   Non-allergic rhinitis 03/08/2008   Dyslipidemia associated with type 2 diabetes mellitus (HCC) 02/01/2008   GERD 02/23/2007   Osteoarthritis 02/23/2007   Osteoporosis 02/23/2007   Past Surgical History:  Procedure Laterality Date   ABDOMINAL SURGERY     CARDIAC CATHETERIZATION N/A 02/02/2016   Procedure: Left Heart Cath and Coronary Angiography;  Surgeon: Peter M Swaziland, MD;  Location: [facility] INVASIVE CV LAB;  Service: Cardiovascular;  Laterality: N/A;   CHOLECYSTECTOMY  1995   ESOPHAGOGASTRODUODENOSCOPY  06/08/2002   GASTRECTOMY N/A 07/20/2014   Procedure: PROXIMAL GASTRECTOMY;  Surgeon: Almond Lint, MD;  Location: [facility] OR;  Service: General;  Laterality: N/A;   GASTROJEJUNOSTOMY N/A 07/20/2014   Procedure: GASTROJEJUNOSTOMY;  Surgeon: Almond Lint, MD;  Location: [facility] OR;  Service: General;  Laterality: N/A;   LAPAROSCOPY N/A 07/20/2014   Procedure: LAPAROSCOPY DIAGNOSTIC;  Surgeon: Almond Lint, MD;  Location: [facility] OR;  Service: General;  Laterality: N/A;    Family History  Problem Relation Age of Onset   Cancer Father        Prostate Cancer   Cancer Sister        Breast Cancer   Diabetes Sister    Diabetes Sister    Other Mother        pneumonia    Cancer Sister        breast cancer     Medications- reviewed and updated Current Outpatient Medications  Medication Sig Dispense Refill   Acetaminophen (TYLENOL PO) Take 500 mg by mouth every 6 (six) hours as needed (Pain).     clopidogrel (PLAVIX) 75 MG tablet Take 1 tablet (75 mg total) by mouth daily. 90 tablet 0   Continuous Glucose Sensor (FREESTYLE LIBRE 2 SENSOR) MISC CHANGE EVERY 14 DAYS     donepezil (ARICEPT) 10 MG tablet TAKE 1 TABLET BY MOUTH ONCE DAILY 90 tablet 0   escitalopram (LEXAPRO) 5 MG tablet Take 1 tablet (5 mg total) by mouth daily. 60 tablet 1   Glucagon (GVOKE  HYPOPEN 1-PACK) 1 MG/0.2ML SOAJ Inject 1 mg into the skin as needed (low blood sugar with impaired consciousness). 0.4 mL 2   insulin aspart protamine - aspart (NOVOLOG MIX 70/30 FLEXPEN) (70-30) 100 UNIT/ML FlexPen Inject 17 Units into the skin daily with breakfast AND 9 Units daily with supper. 30 mL 3   meclizine (ANTIVERT) 25 MG tablet TAKE 1 TABLET BY MOUTH THREE TIMES DAILY IF NEEDED FOR DIZZINESS 30 tablet 0   mirtazapine (REMERON) 15 MG tablet TAKE 1 TABLET BY MOUTH AT BEDTIME 90 tablet 0   pantoprazole (PROTONIX) 40 MG tablet Take 1 tablet (40 mg total) by mouth daily. 90 tablet 3   rosuvastatin (CRESTOR) 5 MG tablet TAKE 1 TABLET BY MOUTH ONCE DAILY 90 tablet 0   No current facility-administered medications for this visit.    Allergies-reviewed and updated Allergies  Allergen Reactions   Pirfenidone Diarrhea and Nausea And Vomiting   Aspirin     rash    Social History   Socioeconomic History   Marital status: Married    Spouse name: Not on file   Number of children: Not on file   Years of education: Not on file   Highest  education level: Not on file  Occupational History   Occupation: Retired    Associate Professor: RETIRED  Tobacco Use   Smoking status: Never   Smokeless tobacco: Never  Vaping Use   Vaping status: Never Used  Substance and Sexual Activity   Alcohol use: No    Alcohol/week: 0.0 standard drinks of alcohol   Drug use: No   Sexual activity: Not Currently    Birth control/protection: Post-menopausal  Other Topics Concern   Not on file  Social History Narrative   Retired-Domestic Assistant   Right handed    Lives with husband    Lives in a one story home   Social Drivers of Health   Financial Resource Strain: Low Risk  (06/04/2023)   Overall Financial Resource Strain (CARDIA)    Difficulty of Paying Living Expenses: Not hard at all  Food Insecurity: No Food Insecurity (06/04/2023)   Hunger Vital Sign    Worried About Running Out of Food in the Last  Year: Never true    Ran Out of Food in the Last Year: Never true  Transportation Needs: No Transportation Needs (06/04/2023)   PRAPARE - Administrator, Civil Service (Medical): No    Lack of Transportation (Non-Medical): No  Physical Activity: Inactive (06/04/2023)   Exercise Vital Sign    Days of Exercise per Week: 0 days    Minutes of Exercise per Session: 0 min  Stress: No Stress Concern Present (06/04/2023)   [employer] of Occupational Health - Occupational Stress Questionnaire    Feeling of Stress : Not at all  Social Connections: Moderately Integrated (06/04/2023)   Social Connection and Isolation Panel [NHANES]    Frequency of Communication with Friends and Family: More than three times a week    Frequency of Social Gatherings with Friends and Family: Twice a week    Attends Religious Services: 1 to 4 times per year    Active Member of [employer] or Organizations: No    Attends Engineer, structural: Never    Marital Status: Married        Objective:  Physical Exam: BP 112/65   Pulse 61   Temp 98.4 F (36.9 C) (Temporal)   Ht 5' (1.524 m)   Wt 126 lb 12.8 oz (57.5 kg)   SpO2 97%   BMI 24.76 kg/m   Body mass index is 24.76 kg/m. Wt Readings from Last 3 Encounters:  08/12/23 126 lb 12.8 oz (57.5 kg)  08/05/23 128 lb (58.1 kg)  07/22/23 124 lb 9.6 oz (56.5 kg)   Gen: NAD, resting comfortablyh HEENT: TMs normal bilaterally. OP clear. No thyromegaly noted.  CV: RRR with no murmurs appreciated Pulm: NWOB, CTAB with no crackles, wheezes, or rhonchi GI: Normal bowel sounds present. Soft, Nontender, Nondistended. MSK: no edema, cyanosis, or clubbing noted Skin: warm, dry Neuro: CN2-12 grossly intact. Strength 5/5 in upper and lower extremities. Reflexes symmetric and intact bilaterally.  Psych: Normal affect and thought content     Damarri Rampy M. Jimmey Ralph, MD 08/12/2023 2:00 PM

## 2023-08-12 NOTE — Assessment & Plan Note (Signed)
Stable on Lexapro 10 mg daily and Remeron 15 mg nightly.

## 2023-08-12 NOTE — Assessment & Plan Note (Signed)
Overall symptoms are stable though still having quite a bit of pain in bilateral knees.  She can use over-the-counter analgesics as needed.  We did discuss referral to sports medicine however she declined.  She will let us know if symptoms become more problematic.

## 2023-08-12 NOTE — Assessment & Plan Note (Signed)
On Crestor 5 mg daily.  Check lipids.

## 2023-08-12 NOTE — Assessment & Plan Note (Signed)
Following with endocrinology

## 2023-08-12 NOTE — Assessment & Plan Note (Signed)
Initially elevated however at goal on recheck.  May have some element of whitecoat hypertension.  She will continue to monitor at home and let us know if persistently elevated.  We discussed lifestyle modifications.

## 2023-08-13 ENCOUNTER — Encounter: Payer: Self-pay | Admitting: Family Medicine

## 2023-08-13 NOTE — Progress Notes (Signed)
Labs are all stable.  We can recheck in a year.

## 2023-08-14 ENCOUNTER — Telehealth: Payer: Self-pay

## 2023-08-14 NOTE — Progress Notes (Signed)
Care Guide Pharmacy Note  08/14/2023 Name: [name] MRN: [id number] DOB: Mar 04, 1933  Referred By: Ardith Dark, MD Reason for referral: Care Coordination ([facility] Diabetes. )   Tammy Hughes is a 89 y.o. year old female who is a primary care patient of Ardith Dark, MD.  CAMERA KRIENKE was referred to the pharmacist for assistance related to: DMII  Successful contact was made with the patient to discuss pharmacy services.  Patient declines engagement at this time. Contact information was provided to the patient should they wish to reach out for assistance at a later time.  [facility]  [facility], [facility] Health Care Management Assistant Direct Dial: [phone]  Fax: [phone]

## 2023-08-22 ENCOUNTER — Encounter: Payer: Self-pay | Admitting: Physician Assistant

## 2023-08-22 ENCOUNTER — Ambulatory Visit: Payer: Medicare PPO | Admitting: Physician Assistant

## 2023-08-22 VITALS — BP 140/72 | HR 68 | Resp 18 | Ht 60.0 in | Wt 128.0 lb

## 2023-08-22 DIAGNOSIS — G301 Alzheimer's disease with late onset: Secondary | ICD-10-CM | POA: Diagnosis not present

## 2023-08-22 DIAGNOSIS — F02A0 Dementia in other diseases classified elsewhere, mild, without behavioral disturbance, psychotic disturbance, mood disturbance, and anxiety: Secondary | ICD-10-CM

## 2023-08-22 DIAGNOSIS — R413 Other amnesia: Secondary | ICD-10-CM

## 2023-08-22 MED ORDER — MEMANTINE HCL 5 MG PO TABS: ORAL_TABLET | ORAL | 3 refills | Status: DC

## 2023-08-22 MED ORDER — DONEPEZIL HCL 10 MG PO TABS: ORAL_TABLET | ORAL | 3 refills | Status: DC

## 2023-08-22 NOTE — Patient Instructions (Signed)
 It was a pleasure to see you today at our office.   Recommendations:  Follow up in 6  months Continue donepezil  10 mg daily. Side effects were discussed   Start Memantine  5mg  tablets.  Take 1 tablet at bedtime for 2 weeks, then 1 tablet twice daily.    Continue mirtazapine  15 mg nightly and  5 mg melatonin for sleep. Consider decreasing the sleeping pill and discontinuing melatonin  Start Lexapro  5 mg daily ofr depression  Consider joining day programs   Whom to call:  Memory  decline, memory medications: Call our office [phone]   For psychiatric meds, mood meds: Please have your primary care physician manage these medications.   Counseling regarding caregiver distress, including caregiver depression, anxiety and issues regarding community resources, adult day care programs, adult living facilities, or memory care questions:   Feel free to contact Misty Waddell Simmer, Social Worker at [phone]   For assessment of decision of mental capacity and competency:  Call Dr. Rosaline Nine, geriatric psychiatrist at [phone]  For guidance in geriatric dementia issues please call [facility] [phone]  For guidance regarding [facility] and if placement were needed at the facility, contact Nat Hock, Social Worker tel: [phone]  If you have any severe symptoms of a stroke, or other severe issues such as confusion,severe chills or fever, etc call 911 or go to the ER as you may need to be evaluated further   Feel free to visit Facebook page  [employer] for tips of how to care for people with memory problems.    RECOMMENDATIONS FOR ALL PATIENTS WITH MEMORY PROBLEMS: 1. Continue to exercise (Recommend 30 minutes of walking everyday, or 3 hours every week) 2. Increase social interactions - continue going to [place] and enjoy social gatherings with friends and family 3. Eat healthy, avoid fried foods and eat more fruits and vegetables 4.  Maintain adequate blood pressure, blood sugar, and blood cholesterol level. Reducing the risk of stroke and cardiovascular disease also helps promoting better memory. 5. Avoid stressful situations. Live a simple life and avoid aggravations. Organize your time and prepare for the next day in anticipation. 6. Sleep well, avoid any interruptions of sleep and avoid any distractions in the bedroom that may interfere with adequate sleep quality 7. Avoid sugar, avoid sweets as there is a strong link between excessive sugar intake, diabetes, and cognitive impairment We discussed the Mediterranean diet, which has been shown to help patients reduce the risk of progressive memory disorders and reduces cardiovascular risk. This includes eating fish, eat fruits and green leafy vegetables, nuts like almonds and hazelnuts, walnuts, and also use olive oil. Avoid fast foods and fried foods as much as possible. Avoid sweets and sugar as sugar use has been linked to worsening of memory function.  There is always a concern of gradual progression of memory problems. If this is the case, then we may need to adjust level of care according to patient needs. Support, both to the patient and caregiver, should then be put into place.    The Alzheimer's Association is here all day, every day for people facing Alzheimer's disease through our free 24/7 Helpline: [phone]. The Helpline provides reliable information and support to all those who need assistance, such as individuals living with memory loss, Alzheimer's or other dementia, caregivers, health care professionals and the public.  Our highly trained and knowledgeable staff can help you with: Understanding memory loss, dementia and Alzheimer's  Medications and other treatment  options  General information about aging and brain health  Skills to provide quality care and to find the best care from professionals  Legal, financial and living-arrangement decisions Our  Helpline also features: Confidential care consultation provided by master's level clinicians who can help with decision-making support, crisis assistance and education on issues families face every day  Help in a caller's preferred language using our translation service that features more than 200 languages and dialects  Referrals to local community programs, services and ongoing support     FALL PRECAUTIONS: Be cautious when walking. Scan the area for obstacles that may increase the risk of trips and falls. When getting up in the mornings, sit up at the edge of the bed for a few minutes before getting out of bed. Consider elevating the bed at the head end to avoid drop of blood pressure when getting up. Walk always in a well-lit room (use night lights in the walls). Avoid area rugs or power cords from appliances in the middle of the walkways. Use a walker or a cane if necessary and consider physical therapy for balance exercise. Get your eyesight checked regularly.  FINANCIAL OVERSIGHT: Supervision, especially oversight when making financial decisions or transactions is also recommended.  HOME SAFETY: Consider the safety of the kitchen when operating appliances like stoves, microwave oven, and blender. Consider having supervision and share cooking responsibilities until no longer able to participate in those. Accidents with firearms and other hazards in the house should be identified and addressed as well.   ABILITY TO BE LEFT ALONE: If patient is unable to contact 911 operator, consider using LifeLine, or when the need is there, arrange for someone to stay with patients. Smoking is a fire hazard, consider supervision or cessation. Risk of wandering should be assessed by caregiver and if detected at any point, supervision and safe proof recommendations should be instituted.  MEDICATION SUPERVISION: Inability to self-administer medication needs to be constantly addressed. Implement a mechanism to  ensure safe administration of the medications.   DRIVING: Regarding driving, in patients with progressive memory problems, driving will be impaired. We advise to have someone else do the driving if trouble finding directions or if minor accidents are reported. Independent driving assessment is available to determine safety of driving.   If you are interested in the driving assessment, you can contact the following:  [employer] in [place] [phone]  [facility] [phone]  [facility] [phone] or [phone]      Mediterranean Diet A Mediterranean diet refers to food and lifestyle choices that are based on the traditions of countries located on the [employer]. This way of eating has been shown to help prevent certain conditions and improve outcomes for people who have chronic diseases, like kidney disease and heart disease. What are tips for following this plan? Lifestyle  Cook and eat meals together with your family, when possible. Drink enough fluid to keep your urine clear or pale yellow. Be physically active every day. This includes: Aerobic exercise like running or swimming. Leisure activities like gardening, walking, or housework. Get 7-8 hours of sleep each night. If recommended by your health care provider, drink red wine in moderation. This means 1 glass a day for nonpregnant women and 2 glasses a day for men. A glass of wine equals 5 oz (150 mL). Reading food labels  Check the serving size of packaged foods. For foods such as rice and pasta, the serving size  refers to the amount of cooked product, not dry. Check the total fat in packaged foods. Avoid foods that have saturated fat or trans fats. Check the ingredients list for added sugars, such as corn syrup. Shopping  At the grocery store, buy most of your food from the areas near the walls of the store. This includes: Fresh  fruits and vegetables (produce). Grains, beans, nuts, and seeds. Some of these may be available in unpackaged forms or large amounts (in bulk). Fresh seafood. Poultry and eggs. Low-fat dairy products. Buy whole ingredients instead of prepackaged foods. Buy fresh fruits and vegetables in-season from local farmers markets. Buy frozen fruits and vegetables in resealable bags. If you do not have access to quality fresh seafood, buy precooked frozen shrimp or canned fish, such as tuna, salmon, or sardines. Buy small amounts of raw or cooked vegetables, salads, or olives from the deli or salad bar at your store. Stock your pantry so you always have certain foods on hand, such as olive oil, canned tuna, canned tomatoes, rice, pasta, and beans. Cooking  Cook foods with extra-virgin olive oil instead of using butter or other vegetable oils. Have meat as a side dish, and have vegetables or grains as your main dish. This means having meat in small portions or adding small amounts of meat to foods like pasta or stew. Use beans or vegetables instead of meat in common dishes like chili or lasagna. Experiment with different cooking methods. Try roasting or broiling vegetables instead of steaming or sauteing them. Add frozen vegetables to soups, stews, pasta, or rice. Add nuts or seeds for added healthy fat at each meal. You can add these to yogurt, salads, or vegetable dishes. Marinate fish or vegetables using olive oil, lemon juice, garlic, and fresh herbs. Meal planning  Plan to eat 1 vegetarian meal one day each week. Try to work up to 2 vegetarian meals, if possible. Eat seafood 2 or more times a week. Have healthy snacks readily available, such as: Vegetable sticks with hummus. Greek yogurt. Fruit and nut trail mix. Eat balanced meals throughout the week. This includes: Fruit: 2-3 servings a day Vegetables: 4-5 servings a day Low-fat dairy: 2 servings a day Fish, poultry, or lean meat: 1 serving  a day Beans and legumes: 2 or more servings a week Nuts and seeds: 1-2 servings a day Whole grains: 6-8 servings a day Extra-virgin olive oil: 3-4 servings a day Limit red meat and sweets to only a few servings a month What are my food choices? Mediterranean diet Recommended Grains: Whole-grain pasta. Brown rice. Bulgar wheat. Polenta. Couscous. Whole-wheat bread. [place] Madeira. Vegetables: Artichokes. Beets. Broccoli. Cabbage. Carrots. Eggplant. Green beans. Chard. Kale. Spinach. Onions. Leeks. Peas. Squash. Tomatoes. Peppers. Radishes. Fruits: Apples. Apricots. Avocado. Berries. Bananas. Cherries. Dates. Figs. Grapes. Lemons. Melon. Oranges. Peaches. Plums. Pomegranate. Meats and other protein foods: Beans. Almonds. Sunflower seeds. Pine nuts. Peanuts. Cod. Salmon. Scallops. Shrimp. Tuna. Tilapia. Clams. Oysters. Eggs. Dairy: Low-fat milk. Cheese. Greek yogurt. Beverages: Water . Red wine. Herbal tea. Fats and oils: Extra virgin olive oil. Avocado oil. Grape seed oil. Sweets and desserts: Greek yogurt with honey. Baked apples. Poached pears. Trail mix. Seasoning and other foods: Basil. Cilantro. Coriander. Cumin. Mint. Parsley. Sage. Rosemary. Tarragon. Garlic. Oregano. Thyme. Pepper. Balsalmic vinegar. Tahini. Hummus. Tomato sauce. Olives. Mushrooms. Limit these Grains: Prepackaged pasta or rice dishes. Prepackaged cereal with added sugar. Vegetables: Deep fried potatoes (french fries). Fruits: Fruit canned in syrup. Meats and other protein foods: Beef. Pork. Lamb.  Poultry with skin. Hot dogs. [place]. Dairy: Ice cream. Sour cream. Whole milk. Beverages: Juice. Sugar-sweetened soft drinks. Beer. Liquor and spirits. Fats and oils: Butter. Canola oil. Vegetable oil. Beef fat (tallow). Lard. Sweets and desserts: Cookies. Cakes. Pies. Candy. Seasoning and other foods: Mayonnaise. Premade sauces and marinades. The items listed may not be a complete list. Talk with your dietitian about what  dietary choices are right for you. Summary The Mediterranean diet includes both food and lifestyle choices. Eat a variety of fresh fruits and vegetables, beans, nuts, seeds, and whole grains. Limit the amount of red meat and sweets that you eat. Talk with your health care provider about whether it is safe for you to drink red wine in moderation. This means 1 glass a day for nonpregnant women and 2 glasses a day for men. A glass of wine equals 5 oz (150 mL). This information is not intended to replace advice given to you by your health care provider. Make sure you discuss any questions you have with your health care provider. Document Released: 02/22/2016 Document Revised: 03/26/2016 Document Reviewed: 02/22/2016 Elsevier Interactive Patient Education  2017 [employer].

## 2023-08-22 NOTE — Progress Notes (Signed)
 Assessment/Plan:   Memory Impairment likely due to Alzheimer's disease  Tammy Hughes is a very pleasant 89 y.o. RH female with a history of hypertension, hyperlipidemia, gastric cancer followed at the cancer center, pulmonary fibrosis, history of esophageal stricture, DM2 with latest A1c above 8, history of NSTEMI January 2016, depression, history of right upper extremity DVT in 2016  presenting today in follow-up for evaluation of memory loss. Patient is on donepezil  10 mg daily.  Mild cognitive decline is noted.  Today's MMSE 24/30.  Discussed adding memantine  to the regimen, she agrees to proceed.  She still able to participate on her ADLs, no longer drives.  Mood is good.   Recommendations:   Follow up in 6 months. Continue donepezil  10 mg daily.  Side effects discussed Start memantine  5 mg twice daily as directed, side effects discussed Continue mirtazapine  for sleep and mood and appetite Continue other mood control meds with PCP, she is on Lexapro  Recommend good control of cardiovascular risk factors, she is on Plavix  daily Continue to control mood as per PCP    Subjective:   This patient is accompanied in the office by her husband who supplements the history. Previous records as well as any outside records available were reviewed prior to todays visit.   Patient was last seen on 09/15/2022     Any changes in memory since last visit?  About the same.  She has some difficulty remembering new information, normal conversations.  Long-term memory is fair.  She continues to do crossword puzzles, word finding and some activities at her retirement community. repeats oneself?  Endorsed Disoriented when walking into a room?  Patient denies    Misplacing objects?  Endorsed  Wandering behavior?   denies   Any personality changes since last visit?  Mood is good Any worsening depression?: denies..   Hallucinations or paranoia?  Denies.   Seizures?   denies    Any sleep changes?  Sleeps well. Denies vivid dreams, REM behavior or sleepwalking   Sleep apnea?   denies    Any hygiene concerns?   Denies.   Independent of bathing and dressing?  Endorsed  Does the patient needs help with medications?  Daughter is in charge   Who is in charge of the finances?  Husband is in charge     Any changes in appetite?  Denies.     Patient have trouble swallowing?  Denies.   Does the patient cook?  Yes, sometimes she forgets common recipes . Any kitchen accidents such as leaving the stove on?   denies   Any headaches?    denies   Vision changes? denies Chronic pain?  Endorsed, due to arthritis in the knees, sometimes she takes tylenol . Ambulates with difficulty?  She las limited mobility due to knee arthritis  Recent falls or head injuries?    Denies.      Unilateral weakness, numbness or tingling?   Denies.   Any tremors?  Only when anxious  Any anosmia? Denies     Any incontinence of urine?  She has some  incontinence, uses pads. Any bowel dysfunction?  Denies.      Patient lives with her husband at the retirement community     Does the patient drive?  No longer drives    History on Initial Assessment 12/28/2019: This is an 89 year old right-handed woman with a history of hypertension, hyperlipidemia, diabetes, stomach cancer, anxiety, depression, presenting for evaluation of memory loss. [name] Hughes is  present to provide additional information. She states I just can't remember. Tammy Hughes started noticing changes around 4 years ago after she underwent chemotherapy and radiation treatment, worse in the past year. She repeats herself several times. She lives with her husband. She was diagnosed with cancer 4 years ago and was forgetting bill payments, her husband took over at that time. She denies getting lost driving. She forgets her medications, they had to count her pills and find that she has not taken them as instructed. She denies leaving the stove on but Tammy Hughes reports she has  burned food, thinking she turned the burner off. She denies misplacing things. She is independent with dressing and bathing. Her 2 sisters and brother had Alzheimer's disease. No concussions or alcohol use. MMSE 24/30 in PCP office last 09/2019.   She has infrequent headaches, occasional dizziness/vertigo, low back pain, urinary incontinence. She denies any diplopia, dysarthria/dysphagia, focal numbness/tingling/weakness, tremors, or anosmia. No falls. Sleep is not good sometimes, she occasionally takes naps. No wandering behavior. Her daughter notes she is a little more irritable than before, she does not like a lot of people around. No hallucinations or paranoia.     Past Medical History:  Diagnosis Date   Allergy     SEASONAL   ANXIETY 03/08/2008   Cataract    REMOVED BILATERAL   Depression    DIVERTICULOSIS, COLON 03/08/2008   DVT (deep venous thrombosis) (HCC)    GERD 02/23/2007   HYPERCHOLESTEROLEMIA 02/01/2008   HYPERTENSION 03/08/2008   Insulin  dependent diabetes mellitus    OSTEOARTHRITIS 02/23/2007   OSTEOPOROSIS 02/23/2007   PERIPHERAL NEUROPATHY 02/23/2007   STD (sexually transmitted disease)    Stomach cancer (HCC) 07/20/14   invasive adenocarcinoma w/signet rings features   Urinary incontinence    Urolithiasis      Past Surgical History:  Procedure Laterality Date   ABDOMINAL SURGERY     CARDIAC CATHETERIZATION N/A 02/02/2016   Procedure: Left Heart Cath and Coronary Angiography;  Surgeon: Peter M Jordan, MD;  Location: [facility] INVASIVE CV LAB;  Service: Cardiovascular;  Laterality: N/A;   CHOLECYSTECTOMY  1995   ESOPHAGOGASTRODUODENOSCOPY  06/08/2002   GASTRECTOMY N/A 07/20/2014   Procedure: PROXIMAL GASTRECTOMY;  Surgeon: Jina Nephew, MD;  Location: [facility] OR;  Service: General;  Laterality: N/A;   GASTROJEJUNOSTOMY N/A 07/20/2014   Procedure: GASTROJEJUNOSTOMY;  Surgeon: Jina Nephew, MD;  Location: [facility] OR;  Service: General;  Laterality: N/A;   LAPAROSCOPY N/A 07/20/2014   Procedure:  LAPAROSCOPY DIAGNOSTIC;  Surgeon: Jina Nephew, MD;  Location: [facility] OR;  Service: General;  Laterality: N/A;     PREVIOUS MEDICATIONS:   CURRENT MEDICATIONS:  Outpatient Encounter Medications as of 08/22/2023  Medication Sig   Acetaminophen  (TYLENOL  PO) Take 500 mg by mouth every 6 (six) hours as needed (Pain).   clopidogrel  (PLAVIX ) 75 MG tablet Take 1 tablet (75 mg total) by mouth daily.   Continuous Glucose Sensor (FREESTYLE LIBRE 2 SENSOR) MISC CHANGE EVERY 14 DAYS   escitalopram  (LEXAPRO ) 5 MG tablet Take 1 tablet (5 mg total) by mouth daily.   Glucagon  (GVOKE HYPOPEN  1-PACK) 1 MG/0.2ML SOAJ Inject 1 mg into the skin as needed (low blood sugar with impaired consciousness).   insulin  aspart protamine - aspart (NOVOLOG  MIX 70/30 FLEXPEN) (70-30) 100 UNIT/ML FlexPen Inject 17 Units into the skin daily with breakfast AND 9 Units daily with supper.   meclizine  (ANTIVERT ) 25 MG tablet TAKE 1 TABLET BY MOUTH THREE TIMES DAILY IF NEEDED FOR DIZZINESS   memantine  (NAMENDA ) 5  MG tablet Take 1 tablet (5 mg at night) for 2 weeks, then increase to 1 tablet (5 mg) twice a day   mirtazapine  (REMERON ) 15 MG tablet TAKE 1 TABLET BY MOUTH AT BEDTIME   pantoprazole  (PROTONIX ) 40 MG tablet Take 1 tablet (40 mg total) by mouth daily.   rosuvastatin  (CRESTOR ) 5 MG tablet TAKE 1 TABLET BY MOUTH ONCE DAILY   [DISCONTINUED] donepezil  (ARICEPT ) 10 MG tablet TAKE 1 TABLET BY MOUTH ONCE DAILY   donepezil  (ARICEPT ) 10 MG tablet TAKE 1 TABLET BY MOUTH ONCE DAILY   No facility-administered encounter medications on file as of 08/22/2023.     Objective:     PHYSICAL EXAMINATION:    VITALS:   Vitals:   08/22/23 1111  BP: (!) 140/72  Pulse: 68  Resp: 18  SpO2: 95%  Weight: 128 lb (58.1 kg)  Height: 5' (1.524 m)    GEN:  The patient appears stated age and is in NAD. HEENT:  Normocephalic, atraumatic.   Neurological examination:  General: NAD, well-groomed, appears stated age. Orientation: The patient is  alert. Oriented to person, place and not to date. Cranial nerves: There is good facial symmetry.The speech is fluent and clear. No aphasia or dysarthria. Fund of knowledge is appropriate, speech is very soft. Recent and remote memory impaired.  Attention and concentration are normal.  Able to name objects and repeat phrases.  Hearing is intact to conversational tone    Delayed recall was 1/3. Sensation: Sensation is intact to light touch throughout Motor: Strength is at least antigravity x4. DTR's 2/4 in UE/LE       No data to display             08/22/2023   11:00 AM 09/20/2021    8:00 AM 03/21/2021    1:00 PM  MMSE - Mini Mental State Exam  Orientation to time 4 5 4   Orientation to Place 3 5 5   Registration 3 3 3   Attention/ Calculation 5 4 1   Recall 1 2 3   Language- name 2 objects 2 2 2   Language- repeat 1 1 1   Language- follow 3 step command 3 3 3   Language- read & follow direction 1 1 1   Write a sentence 1 1 1   Copy design 0 0 0  Total score 24 27 24        Movement examination: Tone: There is normal tone in the UE/LE Abnormal movements:  no tremor.  No myoclonus.  No asterixis.   Coordination:  There is no decremation with RAM's. Normal finger to nose  Gait and Station: The patient has no difficulty arising out of a deep-seated chair without the use of the hands. The patient's stride length is good.  Gait is cautious and narrow.   Thank you for allowing us  the opportunity to participate in the care of this nice patient. Please do not hesitate to contact us  for any questions or concerns.   Total time spent on today's visit was 20 minutes dedicated to this patient today, preparing to see patient, examining the patient, ordering tests and/or medications and counseling the patient, documenting clinical information in the EHR or other health record, independently interpreting results and communicating results to the patient/family, discussing treatment and goals, answering  patient's questions and coordinating care.  Cc:  Kennyth Worth HERO, MD  Camie Sevin 08/22/2023 11:57 AM

## 2023-08-28 ENCOUNTER — Encounter: Payer: Self-pay | Admitting: Family Medicine

## 2023-08-28 ENCOUNTER — Other Ambulatory Visit: Payer: Self-pay | Admitting: *Deleted

## 2023-08-28 MED ORDER — ESCITALOPRAM OXALATE 5 MG PO TABS: 5.0000 mg | ORAL_TABLET | Freq: Every day | ORAL | 1 refills | Status: DC

## 2023-09-09 ENCOUNTER — Telehealth: Payer: Self-pay

## 2023-09-09 NOTE — Telephone Encounter (Signed)
 Patient was identified as falling into the [place] Measure - Diabetes.   Patient was: Left voicemail to schedule with primary care provider.

## 2023-09-30 ENCOUNTER — Other Ambulatory Visit: Payer: Self-pay | Admitting: Family Medicine

## 2023-09-30 MED ORDER — MIRTAZAPINE 15 MG PO TABS: 15.0000 mg | ORAL_TABLET | Freq: Every day | ORAL | 3 refills | Status: AC

## 2023-09-30 NOTE — Telephone Encounter (Signed)
 Will send

## 2023-09-30 NOTE — Telephone Encounter (Signed)
 Patient comment: Pick send prescription to [facility] . because I will run out in a few days. Please send a 5 day supply to [facility] [address] [facility] of [place] .

## 2023-10-06 DIAGNOSIS — E119 Type 2 diabetes mellitus without complications: Secondary | ICD-10-CM | POA: Diagnosis not present

## 2023-10-06 DIAGNOSIS — H26491 Other secondary cataract, right eye: Secondary | ICD-10-CM | POA: Diagnosis not present

## 2023-10-06 LAB — HM DIABETES EYE EXAM

## 2023-10-07 ENCOUNTER — Encounter: Payer: Self-pay | Admitting: Family Medicine

## 2023-10-28 ENCOUNTER — Encounter (HOSPITAL_BASED_OUTPATIENT_CLINIC_OR_DEPARTMENT_OTHER): Payer: Self-pay

## 2023-11-03 ENCOUNTER — Other Ambulatory Visit: Payer: Self-pay | Admitting: *Deleted

## 2023-11-03 ENCOUNTER — Encounter: Payer: Self-pay | Admitting: "Endocrinology

## 2023-11-03 ENCOUNTER — Ambulatory Visit: Payer: Medicare PPO | Admitting: "Endocrinology

## 2023-11-03 ENCOUNTER — Encounter: Payer: Self-pay | Admitting: Family Medicine

## 2023-11-03 VITALS — BP 110/80 | HR 53 | Ht 60.0 in | Wt 127.0 lb

## 2023-11-03 DIAGNOSIS — E782 Mixed hyperlipidemia: Secondary | ICD-10-CM

## 2023-11-03 DIAGNOSIS — Z794 Long term (current) use of insulin: Secondary | ICD-10-CM | POA: Diagnosis not present

## 2023-11-03 DIAGNOSIS — E1165 Type 2 diabetes mellitus with hyperglycemia: Secondary | ICD-10-CM | POA: Diagnosis not present

## 2023-11-03 LAB — POCT GLYCOSYLATED HEMOGLOBIN (HGB A1C): Hemoglobin A1C: 8.3 % — AB (ref 4.0–5.6)

## 2023-11-03 MED ORDER — ESCITALOPRAM OXALATE 5 MG PO TABS: 5.0000 mg | ORAL_TABLET | Freq: Every day | ORAL | 0 refills | Status: DC

## 2023-11-03 NOTE — Progress Notes (Signed)
 Outpatient Endocrinology Note [name], MD  11/03/23   Tammy Hughes 15-Oct-1932 [id number]  Referring Provider: Rodney Clamp, MD Primary Care Provider: Rodney Clamp, MD Reason for consultation: Subjective   Assessment & Plan  Diagnoses and all orders for this visit:  Uncontrolled type 2 diabetes mellitus with hyperglycemia, with long-term current use of insulin  (HCC) -     POCT glycosylated hemoglobin (Hb A1C)  Long-term insulin  use (HCC)  Mixed hypercholesterolemia and hypertriglyceridemia    Diabetes Type II complicated by nephropathy  Lab Results  Component Value Date   GFR 46.44 (L) 08/12/2023   Hba1c goal less than 7, current Hba1c is  Lab Results  Component Value Date   HGBA1C 8.3 (A) 11/03/2023   Will recommend the following: NovoLog  Mix 70/30 insulin : 20-22 units a.m. and 9 units before dinner-15 min before meals  Previously changed the libre alarm low setting from 70 to 80 to allow patient to take snacks to avoid low blood sugar, instructed to keep snacks by bedside Fasting labs before next visit Ordered glucagon  on 04/21/2023 and discussed the use at length with patient and her accompanying attendant (daughter)  No known contraindications to any of above medications  -Last LD and Tg are as follows: Lab Results  Component Value Date   LDLCALC 39 08/12/2023    Lab Results  Component Value Date   TRIG 211.0 (H) 08/12/2023   -Recommend rosuvastatin  5 mg QD -Follow low fat diet and exercise   -Blood pressure goal <140/90 - Microalbumin/creatinine goal < 30 -Last MA/Cr is as follows: Lab Results  Component Value Date   MICROALBUR 8.8 (H) 04/14/2023   -not on ACE/ARB  -diet changes including salt restriction -limit eating outside -counseled BP targets per standards of diabetes care -uncontrolled blood pressure can lead to retinopathy, nephropathy and cardiovascular and atherosclerotic heart disease  Reviewed and counseled  on: -A1C target -Blood sugar targets -Complications of uncontrolled diabetes  -Checking blood sugar before meals and bedtime and bring log next visit -All medications with mechanism of action and side effects -Hypoglycemia management: rule of 15's, Glucagon  Emergency Kit and medical alert ID -low-carb low-fat plate-method diet -At least 20 minutes of physical activity per day -Annual dilated retinal eye exam and foot exam -compliance and follow up needs -follow up as scheduled or earlier if problem gets worse  Call if blood sugar is less than 70 or consistently above 250    Take a 15 gm snack of carbohydrate at bedtime before you go to sleep if your blood sugar is less than 100.    If you are going to fast after midnight for a test or procedure, ask your physician for instructions on how to reduce/decrease your insulin  dose.    Call if blood sugar is less than 70 or consistently above 250  -Treating a low sugar by rule of 15  (15 gms of sugar every 15 min until sugar is more than 70) If you feel your sugar is low, test your sugar to be sure If your sugar is low (less than 70), then take 15 grams of a fast acting Carbohydrate (3-4 glucose tablets or glucose gel or 4 ounces of juice or regular soda) Recheck your sugar 15 min after treating low to make sure it is more than 70 If sugar is still less than 70, treat again with 15 grams of carbohydrate          Don't drive the hour of hypoglycemia  If  unconscious/unable to eat or drink by mouth, use glucagon  injection or nasal spray baqsimi and call 911. Can repeat again in 15 min if still unconscious.  Return in about 31 days (around 12/04/2023).   I have reviewed current medications, nurse's notes, allergies, vital signs, past medical and surgical history, family medical history, and social history for this encounter. Counseled patient on symptoms, examination findings, lab findings, imaging results, treatment decisions and monitoring and  prognosis. The patient understood the recommendations and agrees with the treatment plan. All questions regarding treatment plan were fully answered.  [name], MD  11/03/23   History of Present Illness Tammy Hughes is a 89 y.o. year old female who presents for follow up of Type II diabetes mellitus.  VICTORIAN GUNN was first diagnosed in 1995.   Diabetes education +  Diagnosis date: 1995  Current regimen: NovoLog  Mix 70/30 insulin : 17-18 units a.m. and 9 units before dinner-30 min before meals   Previous history:  Non-insulin  hypoglycemic drugs previously used:?  Metformin, detailed history not available Insulin  was started in 2005 Appears to have been on premixed insulin  for several years She is appears to have relatively stable control except since about early 2022  COMPLICATIONS -  MI/Stroke -  retinopathy -  neuropathy  + nephropathy  BLOOD SUGAR DATA  CGM interpretation: At today's visit, we reviewed her CGM downloads. The full report is scanned in the media. Reviewing the CGM trends, BG are elevated after meals.  Physical Exam  BP 110/80   Pulse (!) 53   Ht 5' (1.524 m)   Wt 127 lb (57.6 kg)   SpO2 96%   BMI 24.80 kg/m    Constitutional: well developed, well nourished Head: normocephalic, atraumatic Eyes: sclera anicteric, no redness Neck: supple Lungs: normal respiratory effort Neurology: alert and oriented Skin: dry, no appreciable rashes Musculoskeletal: no appreciable defects Psychiatric: normal mood and affect Diabetic Foot Exam - Simple   No data filed      Current Medications Patient's Medications  New Prescriptions   No medications on file  Previous Medications   ACETAMINOPHEN  (TYLENOL  PO)    Take 500 mg by mouth every 6 (six) hours as needed (Pain).   CLOPIDOGREL  (PLAVIX ) 75 MG TABLET    Take 1 tablet (75 mg total) by mouth daily.   CONTINUOUS GLUCOSE SENSOR (FREESTYLE LIBRE 2 SENSOR) MISC    CHANGE EVERY 14 DAYS   DONEPEZIL   (ARICEPT ) 10 MG TABLET    TAKE 1 TABLET BY MOUTH ONCE DAILY   ESCITALOPRAM  (LEXAPRO ) 5 MG TABLET    Take 1 tablet (5 mg total) by mouth daily.   GLUCAGON  (GVOKE HYPOPEN  1-PACK) 1 MG/0.2ML SOAJ    Inject 1 mg into the skin as needed (low blood sugar with impaired consciousness).   INSULIN  ASPART PROTAMINE - ASPART (NOVOLOG  MIX 70/30 FLEXPEN) (70-30) 100 UNIT/ML FLEXPEN    Inject 17 Units into the skin daily with breakfast AND 9 Units daily with supper.   MECLIZINE  (ANTIVERT ) 25 MG TABLET    TAKE 1 TABLET BY MOUTH THREE TIMES DAILY IF NEEDED FOR DIZZINESS   MEMANTINE  (NAMENDA ) 5 MG TABLET    Take 1 tablet (5 mg at night) for 2 weeks, then increase to 1 tablet (5 mg) twice a day   MIRTAZAPINE  (REMERON ) 15 MG TABLET    Take 1 tablet (15 mg total) by mouth at bedtime.   PANTOPRAZOLE  (PROTONIX ) 40 MG TABLET    Take 1 tablet (40 mg total) by mouth  daily.   ROSUVASTATIN  (CRESTOR ) 5 MG TABLET    TAKE 1 TABLET BY MOUTH ONCE DAILY  Modified Medications   No medications on file  Discontinued Medications   No medications on file    Allergies Allergies  Allergen Reactions   Pirfenidone Diarrhea and Nausea And Vomiting   Aspirin      rash    Past Medical History Past Medical History:  Diagnosis Date   Allergy     SEASONAL   ANXIETY 03/08/2008   Cataract    REMOVED BILATERAL   Depression    DIVERTICULOSIS, COLON 03/08/2008   DVT (deep venous thrombosis) (HCC)    GERD 02/23/2007   HYPERCHOLESTEROLEMIA 02/01/2008   HYPERTENSION 03/08/2008   Insulin  dependent diabetes mellitus    OSTEOARTHRITIS 02/23/2007   OSTEOPOROSIS 02/23/2007   PERIPHERAL NEUROPATHY 02/23/2007   STD (sexually transmitted disease)    Stomach cancer (HCC) 07/20/14   invasive adenocarcinoma w/signet rings features   Urinary incontinence    Urolithiasis     Past Surgical History Past Surgical History:  Procedure Laterality Date   ABDOMINAL SURGERY     CARDIAC CATHETERIZATION N/A 02/02/2016   Procedure: Left Heart Cath and  Coronary Angiography;  Surgeon: Peter M [place], MD;  Location: [facility] INVASIVE CV LAB;  Service: Cardiovascular;  Laterality: N/A;   CHOLECYSTECTOMY  1995   ESOPHAGOGASTRODUODENOSCOPY  06/08/2002   GASTRECTOMY N/A 07/20/2014   Procedure: PROXIMAL GASTRECTOMY;  Surgeon: Lockie Rima, MD;  Location: [facility] OR;  Service: General;  Laterality: N/A;   GASTROJEJUNOSTOMY N/A 07/20/2014   Procedure: GASTROJEJUNOSTOMY;  Surgeon: Lockie Rima, MD;  Location: [facility] OR;  Service: General;  Laterality: N/A;   LAPAROSCOPY N/A 07/20/2014   Procedure: LAPAROSCOPY DIAGNOSTIC;  Surgeon: Lockie Rima, MD;  Location: [facility] OR;  Service: General;  Laterality: N/A;    Family History family history includes Cancer in her father, sister, and sister; Diabetes in her sister and sister; Other in her mother.  Social History Social History   Socioeconomic History   Marital status: Married    Spouse name: Not on file   Number of children: Not on file   Years of education: Not on file   Highest education level: Not on file  Occupational History   Occupation: Retired    Associate Professor: RETIRED  Tobacco Use   Smoking status: Never   Smokeless tobacco: Never  Vaping Use   Vaping status: Never Used  Substance and Sexual Activity   Alcohol use: No    Alcohol/week: 0.0 standard drinks of alcohol   Drug use: No   Sexual activity: Not Currently    Birth control/protection: Post-menopausal  Other Topics Concern   Not on file  Social History Narrative   Retired-Domestic Assistant   Right handed    Lives with husband    Lives in a one story home   Social Drivers of Health   Financial Resource Strain: Low Risk  (06/04/2023)   Overall Financial Resource Strain (CARDIA)    Difficulty of Paying Living Expenses: Not hard at all  Food Insecurity: No Food Insecurity (06/04/2023)   Hunger Vital Sign    Worried About Running Out of Food in the Last Year: Never true    Ran Out of Food in the Last Year: Never true  Transportation Needs: No  Transportation Needs (06/04/2023)   PRAPARE - Administrator, Civil Service (Medical): No    Lack of Transportation (Non-Medical): No  Physical Activity: Inactive (06/04/2023)   Exercise Vital Sign    Days  of Exercise per Week: 0 days    Minutes of Exercise per Session: 0 min  Stress: No Stress Concern Present (06/04/2023)   [employer] of Occupational Health - Occupational Stress Questionnaire    Feeling of Stress : Not at all  Social Connections: Moderately Integrated (06/04/2023)   Social Connection and Isolation Panel [NHANES]    Frequency of Communication with Friends and Family: More than three times a week    Frequency of Social Gatherings with Friends and Family: Twice a week    Attends Religious Services: 1 to 4 times per year    Active Member of Clubs or Organizations: No    Attends Banker Meetings: Never    Marital Status: Married  Catering manager Violence: Not At Risk (06/04/2023)   Humiliation, Afraid, Rape, and Kick questionnaire    Fear of Current or Ex-Partner: No    Emotionally Abused: No    Physically Abused: No    Sexually Abused: No    Lab Results  Component Value Date   HGBA1C 8.3 (A) 11/03/2023   HGBA1C 8.5 (A) 06/23/2023   HGBA1C 8.2 (H) 04/14/2023   Lab Results  Component Value Date   CHOL 126 08/12/2023   Lab Results  Component Value Date   HDL 44.70 08/12/2023   Lab Results  Component Value Date   LDLCALC 39 08/12/2023   Lab Results  Component Value Date   TRIG 211.0 (H) 08/12/2023   Lab Results  Component Value Date   CHOLHDL 3 08/12/2023   Lab Results  Component Value Date   CREATININE 1.07 08/12/2023   Lab Results  Component Value Date   GFR 46.44 (L) 08/12/2023   Lab Results  Component Value Date   MICROALBUR 8.8 (H) 04/14/2023      Component Value Date/Time   NA 138 08/12/2023 1358   NA 139 11/07/2014 1001   K 4.0 08/12/2023 1358   K 4.6 11/07/2014 1001   CL 105 08/12/2023 1358   CO2  26 08/12/2023 1358   CO2 26 11/07/2014 1001   GLUCOSE 251 (H) 08/12/2023 1358   GLUCOSE 189 (H) 11/07/2014 1001   BUN 18 08/12/2023 1358   BUN 15.8 11/07/2014 1001   CREATININE 1.07 08/12/2023 1358   CREATININE 0.90 (H) 06/21/2020 1402   CREATININE 0.8 11/07/2014 1001   CALCIUM  10.2 08/12/2023 1358   CALCIUM  9.8 11/07/2014 1001   PROT 7.2 08/12/2023 1358   PROT 7.1 11/07/2014 1001   ALBUMIN 3.9 08/12/2023 1358   ALBUMIN 2.8 (L) 11/07/2014 1001   AST 21 08/12/2023 1358   AST 23 11/07/2014 1001   ALT 15 08/12/2023 1358   ALT 15 11/07/2014 1001   ALKPHOS 73 08/12/2023 1358   ALKPHOS 99 11/07/2014 1001   BILITOT 0.3 08/12/2023 1358   BILITOT 0.28 11/07/2014 1001   GFRNONAA 50 (L) 04/10/2023 1240   GFRNONAA 58 (L) 03/22/2020 1359   GFRAA 67 03/22/2020 1359      Latest Ref Rng & Units 08/12/2023    1:58 PM 04/14/2023    8:19 AM 04/10/2023   12:40 PM  BMP  Glucose 70 - 99 mg/dL 409  811  914   BUN 6 - 23 mg/dL 18  19  15    Creatinine 0.40 - 1.20 mg/dL 7.82  9.56  2.13   Sodium 135 - 145 mEq/L 138  142  139   Potassium 3.5 - 5.1 mEq/L 4.0  4.0  4.2   Chloride 96 - 112 mEq/L 105  104  104   CO2 19 - 32 mEq/L 26  29  26    Calcium  8.4 - 10.5 mg/dL 30.8  65.7  9.7        Component Value Date/Time   WBC 4.2 08/12/2023 1358   RBC 4.07 08/12/2023 1358   HGB 12.2 08/12/2023 1358   HGB 11.0 (L) 11/07/2014 1001   HCT 38.1 08/12/2023 1358   HCT 34.3 (L) 11/07/2014 1001   PLT 262.0 08/12/2023 1358   PLT 242 11/07/2014 1001   MCV 93.7 08/12/2023 1358   MCV 89.6 11/07/2014 1001   MCH 30.7 04/10/2023 1240   MCHC 32.1 08/12/2023 1358   RDW 13.5 08/12/2023 1358   RDW 21.5 (H) 11/07/2014 1001   LYMPHSABS 1.3 04/10/2023 1240   LYMPHSABS 0.4 (L) 11/07/2014 1001   MONOABS 0.4 04/10/2023 1240   MONOABS 0.8 11/07/2014 1001   EOSABS 0.1 04/10/2023 1240   EOSABS 0.2 11/07/2014 1001   BASOSABS 0.0 04/10/2023 1240   BASOSABS 0.0 11/07/2014 1001     Parts of this note may have been  dictated using voice recognition software. There may be variances in spelling and vocabulary which are unintentional. Not all errors are proofread. Please notify the [facility] if any discrepancies are noted or if the meaning of any statement is not clear.

## 2023-11-03 NOTE — Patient Instructions (Addendum)
 Recommend: NovoLog  Mix 70/30 insulin : 20-22 units a.m. and 9 units before dinner-30 min before meals (goal 150-180 two hours after meal)  _______________________   Goals of DM therapy:  Morning Fasting blood sugar: 80-140  Blood sugar before meals: 80-140 Bed time blood sugar: 100-150  A1C <7%, limited only by hypoglycemia  1.Diabetes medications and their side effects discussed, including hypoglycemia    2. Check blood glucose:  a) Always check blood sugars before driving. Please see below (under hypoglycemia) on how to manage b) Check a minimum of 3 times/day or more as needed when having symptoms of hypoglycemia.   c) Try to check blood glucose before sleeping/in the middle of the night to ensure that it is remaining stable and not dropping less than 100 d) Check blood glucose more often if sick  3. Diet: a) 3 meals per day schedule b: Restrict carbs to 60-70 grams (4 servings) per meal c) Colorful vegetables - 3 servings a day, and low sugar fruit 2 servings/day Plate control method: 1/4 plate protein, 1/4 starch, 1/2 green, yellow, or red vegetables d) Avoid carbohydrate snacks unless hypoglycemic episode, or increased physical activity  4. Regular exercise as tolerated, preferably 3 or more hours a week  5. Hypoglycemia: a)  Do not drive or operate machinery without first testing blood glucose to assure it is over 90 mg%, or if dizzy, lightheaded, not feeling normal, etc, or  if foot or leg is numb or weak. b)  If blood glucose less than 70, take four 5gm Glucose tabs or 15-30 gm Glucose gel.  Repeat every 15 min as needed until blood sugar is >100 mg/dl. If hypoglycemia persists then call 911.   6. Sick day management: a) Check blood glucose more often b) Continue usual therapy if blood sugars are elevated.   7. Contact the doctor immediately if blood glucose is frequently <60 mg/dl, or an episode of severe hypoglycemia occurs (where someone had to give you glucose/   glucagon  or if you passed out from a low blood glucose), or if blood glucose is persistently >350 mg/dl, for further management  8. A change in level of physical activity or exercise and a change in diet may also affect your blood sugar. Check blood sugars more often and call if needed.  Instructions: 1. Bring glucose meter, blood glucose records on every visit for review 2. Continue to follow up with primary care physician and other providers for medical care 3. Yearly eye  and foot exam 4. Please get blood work done prior to the next appointment

## 2023-11-04 ENCOUNTER — Encounter: Payer: Self-pay | Admitting: "Endocrinology

## 2023-12-02 ENCOUNTER — Ambulatory Visit (HOSPITAL_BASED_OUTPATIENT_CLINIC_OR_DEPARTMENT_OTHER): Admitting: Pulmonary Disease

## 2023-12-04 ENCOUNTER — Encounter: Payer: Self-pay | Admitting: Nurse Practitioner

## 2023-12-04 ENCOUNTER — Ambulatory Visit (INDEPENDENT_AMBULATORY_CARE_PROVIDER_SITE_OTHER): Admitting: Nurse Practitioner

## 2023-12-04 VITALS — BP 134/82 | HR 52 | Ht 60.0 in | Wt 128.4 lb

## 2023-12-04 DIAGNOSIS — R519 Headache, unspecified: Secondary | ICD-10-CM | POA: Insufficient documentation

## 2023-12-04 DIAGNOSIS — J84112 Idiopathic pulmonary fibrosis: Secondary | ICD-10-CM | POA: Diagnosis not present

## 2023-12-04 DIAGNOSIS — L821 Other seborrheic keratosis: Secondary | ICD-10-CM | POA: Diagnosis not present

## 2023-12-04 DIAGNOSIS — G4489 Other headache syndrome: Secondary | ICD-10-CM

## 2023-12-04 DIAGNOSIS — R5383 Other fatigue: Secondary | ICD-10-CM | POA: Diagnosis not present

## 2023-12-04 DIAGNOSIS — L820 Inflamed seborrheic keratosis: Secondary | ICD-10-CM | POA: Diagnosis not present

## 2023-12-04 NOTE — Patient Instructions (Signed)
 Monitor your breathing and if you feel like you start having trouble with it, let us  know  We discussed monitoring options today for your fibrosis. You have opted to hold off on any repeat imaging or lung function testing for now  Try to work on increasing activity as much as possible   If the headache doesn't getting better or you develop any vision changes, lightheadedness/dizziness, weakness, etc, go to the emergency department.   Follow up in one year with Tammy Hughes. If symptoms worsen, please contact office for sooner follow up or seek emergency care.

## 2023-12-04 NOTE — Progress Notes (Signed)
 @Patient  ID: Tammy Hughes, female    DOB: Jan 16, 1933, 89 y.o.   MRN: [id number]  Chief Complaint  Patient presents with   Follow-up    Referring provider: Rodney Clamp, MD  HPI: 89 year old female, never smoker followed for idiopathic pulmonary fibrosis. Previously treated with Esbriet but did not tolerate it. She is a patient of Dr. Hortense Lyons and last seen in office on 11/25/2022 by [facility] NP. Past medical history significant for HTN, CAD, hx of DVT, TIA, non-allergic rhinitis, GERD, NASH, gastric cancer Stage IIA s/p gastrectomy 2016, HLD, DM II, mild dementia, osteoarthritis, back pain, insomnia.   TEST/EVENTS:  09/2013 PFTs: DLCO 72%, TLC 93%, FEV1 80% 10/2014 CTA chest: ILD, favored fibrotic NSIP, stable from 2015 11/2020 HRCT: widespread but patchy areas of ground-glass attenuation, septal thickening, subpleural reticulation, parenchymal banding, traction bronchiectasis and honeycombing. Findings have a mild craniocaudal gradient and are clearly progressive compared to prior study from 2016 12/2020 PFTs: FVC 88%, DLCO 12.57 (75%) 12/21/2021 HRCT chest: atherosclerosis. Moderate pulmonary fibrosis with apical to basal gradient. Honeycombing at bases. UIP pattern. Not significantly changed when compared to 2022 but progressed when compared to 2016.   01/21/2022: OV with Dr. Villa Greaser. CT stable from 2022, worse compared to 2016. Unable to tolerate Esbriet in the past. Discussion surrounding antifibrotic treatment with Ofev; pt declined. Walking oximetry without desaturation. F/u 6 months.   05/27/2022: OV with Elia Nunley NP for six month follow up. Breathing is stable. She doesn't really have much shortness of breath at baseline. No increased cough or chest congestion. Main symptom is fatigue. Lives a very sedentary lifestyle. Her daughter tries to encourage her to be more active. She is not on any antifibrotic therapy. Previously unable to tolerate Esbriet and declined to try Ofev d/t side effect profile.  Previously started on protonix  d/t burning chest discomfort; this has significantly helped. Would like to continue this.   11/25/2022: OV with Derric Dealmeida NP for follow up with her husband. She has been feeling about the same as her last visit. Doesn't feel like she has much shortness of breath. No increased cough or chest congestion. She still has daily fatigue and doesn't do much around the house. Watches TV in her chair most days. She has been trying to be a little more active. Eating and drinking well. Weight is stable. GERD well controlled on PPI. No concerns or complaints today.  12/04/2023: Today - follow up Discussed the use of AI scribe software for clinical note transcription with the patient, who gave verbal consent to proceed.  History of Present Illness   Tammy Hughes is an 89 year old female with pulmonary fibrosis who presents for overdue follow up. Her husband is with her today.   From a respiratory standpoint, she has been stable over the past year, with no increased coughing or breathing difficulties. Her husband feels like she does well and doesn't have any breathing troubles. She previously tried antifibrotic therapy but did not tolerate it well. She has not undergone recent lung function testing or CT imaging. She is relatively sedentary at home. No significant cough or chest congestion. No weight loss.   She is feeling tired today and has been experiencing a headache that began this morning. No vision changes, weakness, lightheadedness/dizziness. She has not taken any medication for it yet. Their son had a heart attack this morning so her husband thinks this has contributed to her headache and fatigue. He is stable at [facility] currently.  Allergies  Allergen Reactions   Pirfenidone Diarrhea and Nausea And Vomiting   Aspirin      rash    Immunization History  Administered Date(s) Administered   Fluad Quad(high Dose 65+) 03/19/2019, 04/18/2020, 06/04/2021, 05/01/2022    Influenza Split 06/14/2004, 04/17/2009, 07/15/2009, 07/15/2010, 05/16/2011, 04/16/2012, 05/15/2013   Influenza Whole 04/11/2008, 03/15/2010   Influenza, High Dose Seasonal PF 05/27/2017, 04/24/2018, 05/06/2023   Influenza,inj,Quad PF,6+ Mos 04/06/2014, 03/28/2015, 03/15/2016   Influenza-Unspecified 04/13/2013   Moderna Covid-19 Fall Seasonal Vaccine 14yrs & older 05/14/2022   Moderna SARS-COV2 Booster Vaccination 10/25/2020   Moderna Sars-Covid-2 Vaccination 09/09/2019, 10/07/2019, 05/09/2020   Pfizer(Comirnaty)Fall Seasonal Vaccine 12 years and older 05/06/2023   Pneumococcal Conjugate-13 11/12/2013   Pneumococcal Polysaccharide-23 07/15/1993, 07/15/2004, 07/23/2011   Td 01/12/1998, 07/17/2007   Tdap 11/21/2017   Zoster Recombinant(Shingrix) 02/05/2022, 06/04/2022   Zoster, Live 06/25/2016    Past Medical History:  Diagnosis Date   Allergy     SEASONAL   ANXIETY 03/08/2008   Cataract    REMOVED BILATERAL   Depression    DIVERTICULOSIS, COLON 03/08/2008   DVT (deep venous thrombosis) (HCC)    GERD 02/23/2007   HYPERCHOLESTEROLEMIA 02/01/2008   HYPERTENSION 03/08/2008   Insulin  dependent diabetes mellitus    OSTEOARTHRITIS 02/23/2007   OSTEOPOROSIS 02/23/2007   PERIPHERAL NEUROPATHY 02/23/2007   STD (sexually transmitted disease)    Stomach cancer (HCC) 07/20/14   invasive adenocarcinoma w/signet rings features   Urinary incontinence    Urolithiasis     Tobacco History: Social History   Tobacco Use  Smoking Status Never  Smokeless Tobacco Never   Counseling given: Not Answered   Outpatient Medications Prior to Visit  Medication Sig Dispense Refill   Acetaminophen  (TYLENOL  PO) Take 500 mg by mouth every 6 (six) hours as needed (Pain).     clopidogrel  (PLAVIX ) 75 MG tablet Take 1 tablet (75 mg total) by mouth daily. 90 tablet 0   Continuous Glucose Sensor (FREESTYLE LIBRE 2 SENSOR) MISC CHANGE EVERY 14 DAYS     donepezil  (ARICEPT ) 10 MG tablet TAKE 1 TABLET BY MOUTH ONCE  DAILY 90 tablet 3   escitalopram  (LEXAPRO ) 5 MG tablet Take 1 tablet (5 mg total) by mouth daily. 7 tablet 0   Glucagon  (GVOKE HYPOPEN  1-PACK) 1 MG/0.2ML SOAJ Inject 1 mg into the skin as needed (low blood sugar with impaired consciousness). 0.4 mL 2   insulin  aspart protamine - aspart (NOVOLOG  MIX 70/30 FLEXPEN) (70-30) 100 UNIT/ML FlexPen Inject 17 Units into the skin daily with breakfast AND 9 Units daily with supper. 30 mL 3   meclizine  (ANTIVERT ) 25 MG tablet TAKE 1 TABLET BY MOUTH THREE TIMES DAILY IF NEEDED FOR DIZZINESS 30 tablet 0   memantine  (NAMENDA ) 5 MG tablet Take 1 tablet (5 mg at night) for 2 weeks, then increase to 1 tablet (5 mg) twice a day 180 tablet 3   mirtazapine  (REMERON ) 15 MG tablet Take 1 tablet (15 mg total) by mouth at bedtime. 90 tablet 3   pantoprazole  (PROTONIX ) 40 MG tablet Take 1 tablet (40 mg total) by mouth daily. 90 tablet 3   rosuvastatin  (CRESTOR ) 5 MG tablet TAKE 1 TABLET BY MOUTH ONCE DAILY 90 tablet 0   No facility-administered medications prior to visit.     Review of Systems:   Constitutional: No weight loss or gain, night sweats, fevers, chills, or lassitude. +fatigue (baseline) HEENT: No difficulty swallowing, tooth/dental problems, or sore throat. No sneezing, itching, ear ache, nasal congestion, or post nasal drip. +headache  CV:  No chest pain, orthopnea, PND, swelling in lower extremities, anasarca, dizziness, palpitations, syncope Resp: +cough (unchanged from baseline; occasional). No shortness of breath with exertion or at rest. No excess mucus or change in color of mucus. No hemoptysis. No wheezing.  No chest wall deformity GI:  No indigestion, heartburn, abdominal pain, nausea, vomiting, diarrhea, change in bowel habits, loss of appetite, bloody stools.  GU: No dysuria, change in color of urine, urgency or frequency.   Skin: No rash, lesions, ulcerations MSK:  No joint pain or swelling.   Neuro: No dizziness or lightheadedness.  Psych: No  depression or anxiety. Mood stable.     Physical Exam:  BP 134/82 (BP Location: Right Arm, Patient Position: Sitting, Cuff Size: Normal)   Pulse (!) 52   Ht 5' (1.524 m)   Wt 128 lb 6.4 oz (58.2 kg)   SpO2 99%   BMI 25.08 kg/m   GEN: Pleasant, interactive, well-nourished, elderly; in no acute distress. HEENT:  Normocephalic and atraumatic. PERRLA. Sclera white. Nasal turbinates pink, moist and patent bilaterally. No rhinorrhea present. Oropharynx pink and moist, without exudate or edema. No lesions, ulcerations, or postnasal drip.  NECK:  Supple w/ fair ROM. No JVD present. Normal carotid impulses w/o bruits. Thyroid  symmetrical with no goiter or nodules palpated. No lymphadenopathy.   CV: RRR, no m/r/g, no peripheral edema. Pulses intact, +2 bilaterally. No cyanosis, pallor or clubbing. PULMONARY:  Unlabored, regular breathing. Minimal bibasilar crackles otherwise clear bilaterally A&P w/o wheezes/rhonchi. No accessory muscle use. No dullness to percussion. GI: BS present and normoactive. Soft, non-tender to palpation. No organomegaly or masses detected.  MSK: No erythema, warmth or tenderness. Cap refil <2 sec all extrem. No deformities or joint swelling noted.  Neuro: A/Ox3. No focal deficits noted.   Skin: Warm, no lesions or rashe Psych: Normal affect and behavior. Judgement and thought content appropriate.     Lab Results:  CBC    Component Value Date/Time   WBC 4.2 08/12/2023 1358   RBC 4.07 08/12/2023 1358   HGB 12.2 08/12/2023 1358   HGB 11.0 (L) 11/07/2014 1001   HCT 38.1 08/12/2023 1358   HCT 34.3 (L) 11/07/2014 1001   PLT 262.0 08/12/2023 1358   PLT 242 11/07/2014 1001   MCV 93.7 08/12/2023 1358   MCV 89.6 11/07/2014 1001   MCH 30.7 04/10/2023 1240   MCHC 32.1 08/12/2023 1358   RDW 13.5 08/12/2023 1358   RDW 21.5 (H) 11/07/2014 1001   LYMPHSABS 1.3 04/10/2023 1240   LYMPHSABS 0.4 (L) 11/07/2014 1001   MONOABS 0.4 04/10/2023 1240   MONOABS 0.8 11/07/2014  1001   EOSABS 0.1 04/10/2023 1240   EOSABS 0.2 11/07/2014 1001   BASOSABS 0.0 04/10/2023 1240   BASOSABS 0.0 11/07/2014 1001    BMET    Component Value Date/Time   NA 138 08/12/2023 1358   NA 139 11/07/2014 1001   K 4.0 08/12/2023 1358   K 4.6 11/07/2014 1001   CL 105 08/12/2023 1358   CO2 26 08/12/2023 1358   CO2 26 11/07/2014 1001   GLUCOSE 251 (H) 08/12/2023 1358   GLUCOSE 189 (H) 11/07/2014 1001   BUN 18 08/12/2023 1358   BUN 15.8 11/07/2014 1001   CREATININE 1.07 08/12/2023 1358   CREATININE 0.90 (H) 06/21/2020 1402   CREATININE 0.8 11/07/2014 1001   CALCIUM  10.2 08/12/2023 1358   CALCIUM  9.8 11/07/2014 1001   GFRNONAA 50 (L) 04/10/2023 1240   GFRNONAA 58 (L) 03/22/2020 1359   GFRAA  67 03/22/2020 1359    BNP No results found for: "BNP"   Imaging:  No results found.  Administration History     None          Latest Ref Rng & Units 12/19/2020   11:58 AM 09/30/2013   11:41 AM  PFT Results  FVC-Pre L 1.33  1.60   FVC-Predicted Pre % 88  91   FVC-Post L 1.40  1.42   FVC-Predicted Post % 92  80   Pre FEV1/FVC % % 76  85   Post FEV1/FCV % % 76  82   FEV1-Pre L 1.02  1.36   FEV1-Predicted Pre % 87  101   FEV1-Post L 1.07  1.16   DLCO uncorrected ml/min/mmHg 12.57  14.73   DLCO UNC% % 75  72   DLCO corrected ml/min/mmHg 12.57    DLCO COR %Predicted % 75    DLVA Predicted % 95  133   TLC L  4.31   TLC % Predicted %  93   RV % Predicted %  124     No results found for: "NITRICOXIDE"     Assessment & Plan:   IPF (idiopathic pulmonary fibrosis) (HCC) Clinically stable. She was unable to tolerate Esbriet in the past and declined Ofev. Shared decision to refrain from repeat imaging and lung function testing based on age and desire to not undergo any further treatment/testing. Encouraged her to work on graded exercises. They will notify of any respiratory changes. Aware of disease progression/prognosis.   Patient Instructions  Monitor your breathing  and if you feel like you start having trouble with it, let us  know  We discussed monitoring options today for your fibrosis. You have opted to hold off on any repeat imaging or lung function testing for now  Try to work on increasing activity as much as possible   If the headache doesn't getting better or you develop any vision changes, lightheadedness/dizziness, weakness, etc, go to the emergency department.   Follow up in one year with Dr. Villa Greaser. If symptoms worsen, please contact office for sooner follow up or seek emergency care.    Headache Acute headache. No red flag symptoms. PERRLA. Strength normal. No focal deficits. Advised to monitor symptoms and provided with strict ED precautions. Possible due to stress. Can try OTC analgesic such as tylenol  PRN. Notify PCP if symptoms fail to improve.   Fatigue Baseline. Likely multifactorial. Encouraged to work on graded exercises.       Roetta Clarke, NP 12/04/2023  Pt aware and understands NP's role.

## 2023-12-04 NOTE — Assessment & Plan Note (Signed)
 Clinically stable. She was unable to tolerate Esbriet in the past and declined Ofev. Shared decision to refrain from repeat imaging and lung function testing based on age and desire to not undergo any further treatment/testing. Encouraged her to work on graded exercises. They will notify of any respiratory changes. Aware of disease progression/prognosis.   Patient Instructions  Monitor your breathing and if you feel like you start having trouble with it, let us  know  We discussed monitoring options today for your fibrosis. You have opted to hold off on any repeat imaging or lung function testing for now  Try to work on increasing activity as much as possible   If the headache doesn't getting better or you develop any vision changes, lightheadedness/dizziness, weakness, etc, go to the emergency department.   Follow up in one year with Dr. Villa Greaser. If symptoms worsen, please contact office for sooner follow up or seek emergency care.

## 2023-12-04 NOTE — Assessment & Plan Note (Signed)
 Baseline. Likely multifactorial. Encouraged to work on graded exercises.

## 2023-12-04 NOTE — Assessment & Plan Note (Signed)
 Acute headache. No red flag symptoms. PERRLA. Strength normal. No focal deficits. Advised to monitor symptoms and provided with strict ED precautions. Possible due to stress. Can try OTC analgesic such as tylenol  PRN. Notify PCP if symptoms fail to improve.

## 2023-12-09 ENCOUNTER — Ambulatory Visit: Admitting: "Endocrinology

## 2023-12-09 ENCOUNTER — Encounter: Payer: Self-pay | Admitting: "Endocrinology

## 2023-12-09 VITALS — BP 150/80 | HR 87 | Ht 60.0 in | Wt 129.0 lb

## 2023-12-09 DIAGNOSIS — Z794 Long term (current) use of insulin: Secondary | ICD-10-CM | POA: Diagnosis not present

## 2023-12-09 DIAGNOSIS — E782 Mixed hyperlipidemia: Secondary | ICD-10-CM

## 2023-12-09 DIAGNOSIS — E1165 Type 2 diabetes mellitus with hyperglycemia: Secondary | ICD-10-CM | POA: Diagnosis not present

## 2023-12-09 NOTE — Progress Notes (Signed)
 Outpatient Endocrinology Note [name], MD  12/09/23   Tammy Hughes 02-Sep-1932 [id number]  Referring Provider: Rodney Clamp, MD Primary Care Provider: Rodney Clamp, MD Reason for consultation: Subjective   Assessment & Plan  Diagnoses and all orders for this visit:  Uncontrolled type 2 diabetes mellitus with hyperglycemia, with long-term current use of insulin  (HCC)  Long-term insulin  use (HCC)  Mixed hypercholesterolemia and hypertriglyceridemia     Diabetes Type II complicated by nephropathy  Lab Results  Component Value Date   GFR 46.44 (L) 08/12/2023   Hba1c goal less than 7, current Hba1c is  Lab Results  Component Value Date   HGBA1C 8.3 (A) 11/03/2023   Will recommend the following: NovoLog  Mix 70/30 insulin : 26- units in a.m. and 7 units before dinner-15 min before meals  Previously changed the libre alarm low setting from 70 to 80 to allow patient to take snacks to avoid low blood sugar, instructed to keep snacks by bedside Ordered glucagon  on 04/21/2023 and discussed the use at length with patient and her accompanying attendant (daughter)  No known contraindications to any of above medications  -Last LD and Tg are as follows: Lab Results  Component Value Date   LDLCALC 39 08/12/2023    Lab Results  Component Value Date   TRIG 211.0 (H) 08/12/2023   -Recommend rosuvastatin  5 mg QD -Follow low fat diet and exercise   -Blood pressure goal <140/90 - Microalbumin/creatinine goal < 30 -Last MA/Cr is as follows: Lab Results  Component Value Date   MICROALBUR 8.8 (H) 04/14/2023   -not on ACE/ARB  -diet changes including salt restriction -limit eating outside -counseled BP targets per standards of diabetes care -uncontrolled blood pressure can lead to retinopathy, nephropathy and cardiovascular and atherosclerotic heart disease  Reviewed and counseled on: -A1C target -Blood sugar targets -Complications of uncontrolled diabetes   -Checking blood sugar before meals and bedtime and bring log next visit -All medications with mechanism of action and side effects -Hypoglycemia management: rule of 15's, Glucagon  Emergency Kit and medical alert ID -low-carb low-fat plate-method diet -At least 20 minutes of physical activity per day -Annual dilated retinal eye exam and foot exam -compliance and follow up needs -follow up as scheduled or earlier if problem gets worse  Call if blood sugar is less than 70 or consistently above 250    Take a 15 gm snack of carbohydrate at bedtime before you go to sleep if your blood sugar is less than 100.    If you are going to fast after midnight for a test or procedure, ask your physician for instructions on how to reduce/decrease your insulin  dose.    Call if blood sugar is less than 70 or consistently above 250  -Treating a low sugar by rule of 15  (15 gms of sugar every 15 min until sugar is more than 70) If you feel your sugar is low, test your sugar to be sure If your sugar is low (less than 70), then take 15 grams of a fast acting Carbohydrate (3-4 glucose tablets or glucose gel or 4 ounces of juice or regular soda) Recheck your sugar 15 min after treating low to make sure it is more than 70 If sugar is still less than 70, treat again with 15 grams of carbohydrate          Don't drive the hour of hypoglycemia  If unconscious/unable to eat or drink by mouth, use glucagon  injection or nasal spray  baqsimi and call 911. Can repeat again in 15 min if still unconscious.  Return in about 4 weeks (around 01/06/2024).   I have reviewed current medications, nurse's notes, allergies, vital signs, past medical and surgical history, family medical history, and social history for this encounter. Counseled patient on symptoms, examination findings, lab findings, imaging results, treatment decisions and monitoring and prognosis. The patient understood the recommendations and agrees with the  treatment plan. All questions regarding treatment plan were fully answered.  [name], MD  12/09/23   History of Present Illness Tammy Hughes is a 89 y.o. year old female who presents for follow up of Type II diabetes mellitus.  Tammy Hughes was first diagnosed in 1995.   Diabetes education +  Diagnosis date: 1995  Current regimen: NovoLog  Mix 70/30 insulin : 24 units around 10am-12pm and 9 units around 7 pm before dinner-30 min before meals   Previous history:  Non-insulin  hypoglycemic drugs previously used:?  Metformin, detailed history not available Insulin  was started in 2005 Appears to have been on premixed insulin  for several years She is appears to have relatively stable control except since about early 2022  COMPLICATIONS -  MI/Stroke -  retinopathy -  neuropathy  + nephropathy  BLOOD SUGAR DATA  CGM interpretation: At today's visit, we reviewed her CGM downloads. The full report is scanned in the media. Reviewing the CGM trends, BG are elevated in daytime and drop sometimes overnight/ randomly.   Physical Exam  BP (!) 150/80   Pulse 87   Ht 5' (1.524 m)   Wt 129 lb (58.5 kg)   SpO2 98%   BMI 25.19 kg/m    Constitutional: well developed, well nourished Head: normocephalic, atraumatic Eyes: sclera anicteric, no redness Neck: supple Lungs: normal respiratory effort Neurology: alert and oriented Skin: dry, no appreciable rashes Musculoskeletal: no appreciable defects Psychiatric: normal mood and affect Diabetic Foot Exam - Simple   No data filed      Current Medications Patient's Medications  New Prescriptions   No medications on file  Previous Medications   ACETAMINOPHEN  (TYLENOL  PO)    Take 500 mg by mouth every 6 (six) hours as needed (Pain).   CLOPIDOGREL  (PLAVIX ) 75 MG TABLET    Take 1 tablet (75 mg total) by mouth daily.   CONTINUOUS GLUCOSE SENSOR (FREESTYLE LIBRE 2 SENSOR) MISC    CHANGE EVERY 14 DAYS   DONEPEZIL  (ARICEPT ) 10 MG  TABLET    TAKE 1 TABLET BY MOUTH ONCE DAILY   ESCITALOPRAM  (LEXAPRO ) 5 MG TABLET    Take 1 tablet (5 mg total) by mouth daily.   GLUCAGON  (GVOKE HYPOPEN  1-PACK) 1 MG/0.2ML SOAJ    Inject 1 mg into the skin as needed (low blood sugar with impaired consciousness).   INSULIN  ASPART PROTAMINE - ASPART (NOVOLOG  MIX 70/30 FLEXPEN) (70-30) 100 UNIT/ML FLEXPEN    Inject 17 Units into the skin daily with breakfast AND 9 Units daily with supper.   MECLIZINE  (ANTIVERT ) 25 MG TABLET    TAKE 1 TABLET BY MOUTH THREE TIMES DAILY IF NEEDED FOR DIZZINESS   MEMANTINE  (NAMENDA ) 5 MG TABLET    Take 1 tablet (5 mg at night) for 2 weeks, then increase to 1 tablet (5 mg) twice a day   MIRTAZAPINE  (REMERON ) 15 MG TABLET    Take 1 tablet (15 mg total) by mouth at bedtime.   PANTOPRAZOLE  (PROTONIX ) 40 MG TABLET    Take 1 tablet (40 mg total) by mouth daily.  ROSUVASTATIN  (CRESTOR ) 5 MG TABLET    TAKE 1 TABLET BY MOUTH ONCE DAILY  Modified Medications   No medications on file  Discontinued Medications   No medications on file    Allergies Allergies  Allergen Reactions   Pirfenidone Diarrhea and Nausea And Vomiting   Aspirin      rash    Past Medical History Past Medical History:  Diagnosis Date   Allergy     SEASONAL   ANXIETY 03/08/2008   Cataract    REMOVED BILATERAL   Depression    DIVERTICULOSIS, COLON 03/08/2008   DVT (deep venous thrombosis) (HCC)    GERD 02/23/2007   HYPERCHOLESTEROLEMIA 02/01/2008   HYPERTENSION 03/08/2008   Insulin  dependent diabetes mellitus    OSTEOARTHRITIS 02/23/2007   OSTEOPOROSIS 02/23/2007   PERIPHERAL NEUROPATHY 02/23/2007   STD (sexually transmitted disease)    Stomach cancer (HCC) 07/20/14   invasive adenocarcinoma w/signet rings features   Urinary incontinence    Urolithiasis     Past Surgical History Past Surgical History:  Procedure Laterality Date   ABDOMINAL SURGERY     CARDIAC CATHETERIZATION N/A 02/02/2016   Procedure: Left Heart Cath and Coronary Angiography;   Surgeon: Peter M [place], MD;  Location: [facility] INVASIVE CV LAB;  Service: Cardiovascular;  Laterality: N/A;   CHOLECYSTECTOMY  1995   ESOPHAGOGASTRODUODENOSCOPY  06/08/2002   GASTRECTOMY N/A 07/20/2014   Procedure: PROXIMAL GASTRECTOMY;  Surgeon: Lockie Rima, MD;  Location: [facility] OR;  Service: General;  Laterality: N/A;   GASTROJEJUNOSTOMY N/A 07/20/2014   Procedure: GASTROJEJUNOSTOMY;  Surgeon: Lockie Rima, MD;  Location: [facility] OR;  Service: General;  Laterality: N/A;   LAPAROSCOPY N/A 07/20/2014   Procedure: LAPAROSCOPY DIAGNOSTIC;  Surgeon: Lockie Rima, MD;  Location: [facility] OR;  Service: General;  Laterality: N/A;    Family History family history includes Cancer in her father, sister, and sister; Diabetes in her sister and sister; Other in her mother.  Social History Social History   Socioeconomic History   Marital status: Married    Spouse name: Not on file   Number of children: Not on file   Years of education: Not on file   Highest education level: Not on file  Occupational History   Occupation: Retired    Associate Professor: RETIRED  Tobacco Use   Smoking status: Never   Smokeless tobacco: Never  Vaping Use   Vaping status: Never Used  Substance and Sexual Activity   Alcohol use: No    Alcohol/week: 0.0 standard drinks of alcohol   Drug use: No   Sexual activity: Not Currently    Birth control/protection: Post-menopausal  Other Topics Concern   Not on file  Social History Narrative   Retired-Domestic Assistant   Right handed    Lives with husband    Lives in a one story home   Social Drivers of Health   Financial Resource Strain: Low Risk  (06/04/2023)   Overall Financial Resource Strain (CARDIA)    Difficulty of Paying Living Expenses: Not hard at all  Food Insecurity: No Food Insecurity (06/04/2023)   Hunger Vital Sign    Worried About Running Out of Food in the Last Year: Never true    Ran Out of Food in the Last Year: Never true  Transportation Needs: No Transportation Needs  (06/04/2023)   PRAPARE - Administrator, Civil Service (Medical): No    Lack of Transportation (Non-Medical): No  Physical Activity: Inactive (06/04/2023)   Exercise Vital Sign    Days of Exercise per  Week: 0 days    Minutes of Exercise per Session: 0 min  Stress: No Stress Concern Present (06/04/2023)   [employer] of Occupational Health - Occupational Stress Questionnaire    Feeling of Stress : Not at all  Social Connections: Moderately Integrated (06/04/2023)   Social Connection and Isolation Panel [NHANES]    Frequency of Communication with Friends and Family: More than three times a week    Frequency of Social Gatherings with Friends and Family: Twice a week    Attends Religious Services: 1 to 4 times per year    Active Member of Clubs or Organizations: No    Attends Banker Meetings: Never    Marital Status: Married  Catering manager Violence: Not At Risk (06/04/2023)   Humiliation, Afraid, Rape, and Kick questionnaire    Fear of Current or Ex-Partner: No    Emotionally Abused: No    Physically Abused: No    Sexually Abused: No    Lab Results  Component Value Date   HGBA1C 8.3 (A) 11/03/2023   HGBA1C 8.5 (A) 06/23/2023   HGBA1C 8.2 (H) 04/14/2023   Lab Results  Component Value Date   CHOL 126 08/12/2023   Lab Results  Component Value Date   HDL 44.70 08/12/2023   Lab Results  Component Value Date   LDLCALC 39 08/12/2023   Lab Results  Component Value Date   TRIG 211.0 (H) 08/12/2023   Lab Results  Component Value Date   CHOLHDL 3 08/12/2023   Lab Results  Component Value Date   CREATININE 1.07 08/12/2023   Lab Results  Component Value Date   GFR 46.44 (L) 08/12/2023   Lab Results  Component Value Date   MICROALBUR 8.8 (H) 04/14/2023      Component Value Date/Time   NA 138 08/12/2023 1358   NA 139 11/07/2014 1001   K 4.0 08/12/2023 1358   K 4.6 11/07/2014 1001   CL 105 08/12/2023 1358   CO2 26 08/12/2023 1358    CO2 26 11/07/2014 1001   GLUCOSE 251 (H) 08/12/2023 1358   GLUCOSE 189 (H) 11/07/2014 1001   BUN 18 08/12/2023 1358   BUN 15.8 11/07/2014 1001   CREATININE 1.07 08/12/2023 1358   CREATININE 0.90 (H) 06/21/2020 1402   CREATININE 0.8 11/07/2014 1001   CALCIUM  10.2 08/12/2023 1358   CALCIUM  9.8 11/07/2014 1001   PROT 7.2 08/12/2023 1358   PROT 7.1 11/07/2014 1001   ALBUMIN 3.9 08/12/2023 1358   ALBUMIN 2.8 (L) 11/07/2014 1001   AST 21 08/12/2023 1358   AST 23 11/07/2014 1001   ALT 15 08/12/2023 1358   ALT 15 11/07/2014 1001   ALKPHOS 73 08/12/2023 1358   ALKPHOS 99 11/07/2014 1001   BILITOT 0.3 08/12/2023 1358   BILITOT 0.28 11/07/2014 1001   GFRNONAA 50 (L) 04/10/2023 1240   GFRNONAA 58 (L) 03/22/2020 1359   GFRAA 67 03/22/2020 1359      Latest Ref Rng & Units 08/12/2023    1:58 PM 04/14/2023    8:19 AM 04/10/2023   12:40 PM  BMP  Glucose 70 - 99 mg/dL 161  096  045   BUN 6 - 23 mg/dL 18  19  15    Creatinine 0.40 - 1.20 mg/dL 4.09  8.11  9.14   Sodium 135 - 145 mEq/L 138  142  139   Potassium 3.5 - 5.1 mEq/L 4.0  4.0  4.2   Chloride 96 - 112 mEq/L 105  104  104  CO2 19 - 32 mEq/L 26  29  26    Calcium  8.4 - 10.5 mg/dL 19.1  47.8  9.7        Component Value Date/Time   WBC 4.2 08/12/2023 1358   RBC 4.07 08/12/2023 1358   HGB 12.2 08/12/2023 1358   HGB 11.0 (L) 11/07/2014 1001   HCT 38.1 08/12/2023 1358   HCT 34.3 (L) 11/07/2014 1001   PLT 262.0 08/12/2023 1358   PLT 242 11/07/2014 1001   MCV 93.7 08/12/2023 1358   MCV 89.6 11/07/2014 1001   MCH 30.7 04/10/2023 1240   MCHC 32.1 08/12/2023 1358   RDW 13.5 08/12/2023 1358   RDW 21.5 (H) 11/07/2014 1001   LYMPHSABS 1.3 04/10/2023 1240   LYMPHSABS 0.4 (L) 11/07/2014 1001   MONOABS 0.4 04/10/2023 1240   MONOABS 0.8 11/07/2014 1001   EOSABS 0.1 04/10/2023 1240   EOSABS 0.2 11/07/2014 1001   BASOSABS 0.0 04/10/2023 1240   BASOSABS 0.0 11/07/2014 1001     Parts of this note may have been dictated using voice  recognition software. There may be variances in spelling and vocabulary which are unintentional. Not all errors are proofread. Please notify the [facility] if any discrepancies are noted or if the meaning of any statement is not clear.

## 2023-12-09 NOTE — Patient Instructions (Addendum)
 Will recommend the following: NovoLog  Mix 70/30 insulin : 26 units in a.m. and 7 units before dinner-15 min before meals   _________________    Goals of DM therapy:  Morning Fasting blood sugar: 80-140  Blood sugar before meals: 80-140 Bed time blood sugar: 100-150  A1C <7%, limited only by hypoglycemia  1.Diabetes medications and their side effects discussed, including hypoglycemia    2. Check blood glucose:  a) Always check blood sugars before driving. Please see below (under hypoglycemia) on how to manage b) Check a minimum of 3 times/day or more as needed when having symptoms of hypoglycemia.   c) Try to check blood glucose before sleeping/in the middle of the night to ensure that it is remaining stable and not dropping less than 100 d) Check blood glucose more often if sick  3. Diet: a) 3 meals per day schedule b: Restrict carbs to 60-70 grams (4 servings) per meal c) Colorful vegetables - 3 servings a day, and low sugar fruit 2 servings/day Plate control method: 1/4 plate protein, 1/4 starch, 1/2 green, yellow, or red vegetables d) Avoid carbohydrate snacks unless hypoglycemic episode, or increased physical activity  4. Regular exercise as tolerated, preferably 3 or more hours a week  5. Hypoglycemia: a)  Do not drive or operate machinery without first testing blood glucose to assure it is over 90 mg%, or if dizzy, lightheaded, not feeling normal, etc, or  if foot or leg is numb or weak. b)  If blood glucose less than 70, take four 5gm Glucose tabs or 15-30 gm Glucose gel.  Repeat every 15 min as needed until blood sugar is >100 mg/dl. If hypoglycemia persists then call 911.   6. Sick day management: a) Check blood glucose more often b) Continue usual therapy if blood sugars are elevated.   7. Contact the doctor immediately if blood glucose is frequently <60 mg/dl, or an episode of severe hypoglycemia occurs (where someone had to give you glucose/  glucagon  or if you  passed out from a low blood glucose), or if blood glucose is persistently >350 mg/dl, for further management  8. A change in level of physical activity or exercise and a change in diet may also affect your blood sugar. Check blood sugars more often and call if needed.  Instructions: 1. Bring glucose meter, blood glucose records on every visit for review 2. Continue to follow up with primary care physician and other providers for medical care 3. Yearly eye  and foot exam 4. Please get blood work done prior to the next appointment

## 2023-12-10 ENCOUNTER — Encounter: Payer: Self-pay | Admitting: "Endocrinology

## 2024-01-06 ENCOUNTER — Other Ambulatory Visit: Payer: Self-pay | Admitting: *Deleted

## 2024-01-06 ENCOUNTER — Encounter: Payer: Self-pay | Admitting: Physician Assistant

## 2024-01-06 ENCOUNTER — Encounter: Payer: Self-pay | Admitting: Family Medicine

## 2024-01-06 MED ORDER — ESCITALOPRAM OXALATE 5 MG PO TABS: 5.0000 mg | ORAL_TABLET | Freq: Every day | ORAL | 0 refills | Status: DC

## 2024-01-06 NOTE — Telephone Encounter (Signed)
 Rx send to pharmacy

## 2024-01-07 ENCOUNTER — Other Ambulatory Visit: Payer: Self-pay | Admitting: Physician Assistant

## 2024-01-07 ENCOUNTER — Encounter: Payer: Self-pay | Admitting: Family Medicine

## 2024-01-08 NOTE — Telephone Encounter (Signed)
 Please send in 90 supply with 3 refills.  Worth HERO. Kennyth, MD 01/08/2024 1:00 PM

## 2024-01-08 NOTE — Telephone Encounter (Signed)
**Note De-identified  [place] Obfuscation** Please advise 

## 2024-01-09 ENCOUNTER — Other Ambulatory Visit: Payer: Self-pay | Admitting: *Deleted

## 2024-01-09 MED ORDER — ESCITALOPRAM OXALATE 5 MG PO TABS: 5.0000 mg | ORAL_TABLET | Freq: Every day | ORAL | 3 refills | Status: AC

## 2024-01-09 NOTE — Telephone Encounter (Signed)
 Rx send to pharmacy

## 2024-01-13 ENCOUNTER — Ambulatory Visit: Admitting: Physician Assistant

## 2024-01-13 ENCOUNTER — Encounter: Payer: Self-pay | Admitting: Physician Assistant

## 2024-01-13 VITALS — BP 140/80 | HR 89 | Resp 20 | Wt 132.0 lb

## 2024-01-13 DIAGNOSIS — G301 Alzheimer's disease with late onset: Secondary | ICD-10-CM

## 2024-01-13 DIAGNOSIS — F02A0 Dementia in other diseases classified elsewhere, mild, without behavioral disturbance, psychotic disturbance, mood disturbance, and anxiety: Secondary | ICD-10-CM

## 2024-01-13 MED ORDER — MEMANTINE HCL 5 MG PO TABS: ORAL_TABLET | ORAL | 3 refills | Status: DC

## 2024-01-13 MED ORDER — DONEPEZIL HCL 10 MG PO TABS: ORAL_TABLET | ORAL | 3 refills | Status: AC

## 2024-01-13 MED ORDER — MEMANTINE HCL 10 MG PO TABS: ORAL_TABLET | ORAL | 3 refills | Status: AC

## 2024-01-13 NOTE — Patient Instructions (Signed)
 It was a pleasure to see you today at our office.   Recommendations:  Follow up in 6  months Continue donepezil  10 mg daily. Side effects were discussed   Increase Memantine   to 10 mg tablets.  Take 1   twice daily.    Continue mirtazapine  15 mg nightly  Consider joining day programs    If you have any severe symptoms of a stroke, or other severe issues such as confusion,severe chills or fever, etc call 911 or go to the ER as you may need to be evaluated further   Feel free to visit Facebook page  [employer] for tips of how to care for people with memory problems.    RECOMMENDATIONS FOR ALL PATIENTS WITH MEMORY PROBLEMS: 1. Continue to exercise (Recommend 30 minutes of walking everyday, or 3 hours every week) 2. Increase social interactions - continue going to [place] and enjoy social gatherings with friends and family 3. Eat healthy, avoid fried foods and eat more fruits and vegetables 4. Maintain adequate blood pressure, blood sugar, and blood cholesterol level. Reducing the risk of stroke and cardiovascular disease also helps promoting better memory. 5. Avoid stressful situations. Live a simple life and avoid aggravations. Organize your time and prepare for the next day in anticipation. 6. Sleep well, avoid any interruptions of sleep and avoid any distractions in the bedroom that may interfere with adequate sleep quality 7. Avoid sugar, avoid sweets as there is a strong link between excessive sugar intake, diabetes, and cognitive impairment We discussed the Mediterranean diet, which has been shown to help patients reduce the risk of progressive memory disorders and reduces cardiovascular risk. This includes eating fish, eat fruits and green leafy vegetables, nuts like almonds and hazelnuts, walnuts, and also use olive oil. Avoid fast foods and fried foods as much as possible. Avoid sweets and sugar as sugar use has been linked to worsening of memory function.  There is always a concern of  gradual progression of memory problems. If this is the case, then we may need to adjust level of care according to patient needs. Support, both to the patient and caregiver, should then be put into place.    The Alzheimer's Association is here all day, every day for people facing Alzheimer's disease through our free 24/7 Helpline: [phone]. The Helpline provides reliable information and support to all those who need assistance, such as individuals living with memory loss, Alzheimer's or other dementia, caregivers, health care professionals and the public.  Our highly trained and knowledgeable staff can help you with: Understanding memory loss, dementia and Alzheimer's  Medications and other treatment options  General information about aging and brain health  Skills to provide quality care and to find the best care from professionals  Legal, financial and living-arrangement decisions Our Helpline also features: Confidential care consultation provided by master's level clinicians who can help with decision-making support, crisis assistance and education on issues families face every day  Help in a caller's preferred language using our translation service that features more than 200 languages and dialects  Referrals to local community programs, services and ongoing support     FALL PRECAUTIONS: Be cautious when walking. Scan the area for obstacles that may increase the risk of trips and falls. When getting up in the mornings, sit up at the edge of the bed for a few minutes before getting out of bed. Consider elevating the bed at the head end to avoid drop of blood pressure when getting up. Walk always  in a well-lit room (use night lights in the walls). Avoid area rugs or power cords from appliances in the middle of the walkways. Use a walker or a cane if necessary and consider physical therapy for balance exercise. Get your eyesight checked regularly.  FINANCIAL OVERSIGHT: Supervision,  especially oversight when making financial decisions or transactions is also recommended.  HOME SAFETY: Consider the safety of the kitchen when operating appliances like stoves, microwave oven, and blender. Consider having supervision and share cooking responsibilities until no longer able to participate in those. Accidents with firearms and other hazards in the house should be identified and addressed as well.   ABILITY TO BE LEFT ALONE: If patient is unable to contact 911 operator, consider using LifeLine, or when the need is there, arrange for someone to stay with patients. Smoking is a fire hazard, consider supervision or cessation. Risk of wandering should be assessed by caregiver and if detected at any point, supervision and safe proof recommendations should be instituted.  MEDICATION SUPERVISION: Inability to self-administer medication needs to be constantly addressed. Implement a mechanism to ensure safe administration of the medications.   DRIVING: Regarding driving, in patients with progressive memory problems, driving will be impaired. We advise to have someone else do the driving if trouble finding directions or if minor accidents are reported. Independent driving assessment is available to determine safety of driving.   If you are interested in the driving assessment, you can contact the following:  [facility] [employer] [facility] in [place] [phone]  [facility] [phone]  [facility] [phone] or [phone]      Mediterranean Diet A Mediterranean diet refers to food and lifestyle choices that are based on the traditions of countries located on the [employer]. This way of eating has been shown to help prevent certain conditions and improve outcomes for people who have chronic diseases, like kidney disease and heart disease. What are tips for following this plan? Lifestyle  Cook and eat meals  together with your family, when possible. Drink enough fluid to keep your urine clear or pale yellow. Be physically active every day. This includes: Aerobic exercise like running or swimming. Leisure activities like gardening, walking, or housework. Get 7-8 hours of sleep each night. If recommended by your health care provider, drink red wine in moderation. This means 1 glass a day for nonpregnant women and 2 glasses a day for men. A glass of wine equals 5 oz (150 mL). Reading food labels  Check the serving size of packaged foods. For foods such as rice and pasta, the serving size refers to the amount of cooked product, not dry. Check the total fat in packaged foods. Avoid foods that have saturated fat or trans fats. Check the ingredients list for added sugars, such as corn syrup. Shopping  At the grocery store, buy most of your food from the areas near the walls of the store. This includes: Fresh fruits and vegetables (produce). Grains, beans, nuts, and seeds. Some of these may be available in unpackaged forms or large amounts (in bulk). Fresh seafood. Poultry and eggs. Low-fat dairy products. Buy whole ingredients instead of prepackaged foods. Buy fresh fruits and vegetables in-season from local farmers markets. Buy frozen fruits and vegetables in resealable bags. If you do not have access to quality fresh seafood, buy precooked frozen shrimp or canned fish, such as tuna, salmon, or sardines. Buy small amounts of raw or cooked vegetables, salads, or olives from the deli  or salad bar at your store. Stock your pantry so you always have certain foods on hand, such as olive oil, canned tuna, canned tomatoes, rice, pasta, and beans. Cooking  Cook foods with extra-virgin olive oil instead of using butter or other vegetable oils. Have meat as a side dish, and have vegetables or grains as your main dish. This means having meat in small portions or adding small amounts of meat to foods like pasta  or stew. Use beans or vegetables instead of meat in common dishes like chili or lasagna. Experiment with different cooking methods. Try roasting or broiling vegetables instead of steaming or sauteing them. Add frozen vegetables to soups, stews, pasta, or rice. Add nuts or seeds for added healthy fat at each meal. You can add these to yogurt, salads, or vegetable dishes. Marinate fish or vegetables using olive oil, lemon juice, garlic, and fresh herbs. Meal planning  Plan to eat 1 vegetarian meal one day each week. Try to work up to 2 vegetarian meals, if possible. Eat seafood 2 or more times a week. Have healthy snacks readily available, such as: Vegetable sticks with hummus. Greek yogurt. Fruit and nut trail mix. Eat balanced meals throughout the week. This includes: Fruit: 2-3 servings a day Vegetables: 4-5 servings a day Low-fat dairy: 2 servings a day Fish, poultry, or lean meat: 1 serving a day Beans and legumes: 2 or more servings a week Nuts and seeds: 1-2 servings a day Whole grains: 6-8 servings a day Extra-virgin olive oil: 3-4 servings a day Limit red meat and sweets to only a few servings a month What are my food choices? Mediterranean diet Recommended Grains: Whole-grain pasta. Brown rice. Bulgar wheat. Polenta. Couscous. Whole-wheat bread. [place] Madeira. Vegetables: Artichokes. Beets. Broccoli. Cabbage. Carrots. Eggplant. Green beans. Chard. Kale. Spinach. Onions. Leeks. Peas. Squash. Tomatoes. Peppers. Radishes. Fruits: Apples. Apricots. Avocado. Berries. Bananas. Cherries. Dates. Figs. Grapes. Lemons. Melon. Oranges. Peaches. Plums. Pomegranate. Meats and other protein foods: Beans. Almonds. Sunflower seeds. Pine nuts. Peanuts. Cod. Salmon. Scallops. Shrimp. Tuna. Tilapia. Clams. Oysters. Eggs. Dairy: Low-fat milk. Cheese. Greek yogurt. Beverages: Water . Red wine. Herbal tea. Fats and oils: Extra virgin olive oil. Avocado oil. Grape seed oil. Sweets and desserts:  [place] yogurt with honey. Baked apples. Poached pears. Trail mix. Seasoning and other foods: Basil. Cilantro. Coriander. Cumin. Mint. Parsley. Sage. Rosemary. Tarragon. Garlic. Oregano. Thyme. Pepper. Balsalmic vinegar. Tahini. Hummus. Tomato sauce. Olives. Mushrooms. Limit these Grains: Prepackaged pasta or rice dishes. Prepackaged cereal with added sugar. Vegetables: Deep fried potatoes (french fries). Fruits: Fruit canned in syrup. Meats and other protein foods: Beef. Pork. Lamb. Poultry with skin. Hot dogs. [place]. Dairy: Ice cream. Sour cream. Whole milk. Beverages: Juice. Sugar-sweetened soft drinks. Beer. Liquor and spirits. Fats and oils: Butter. Canola oil. Vegetable oil. Beef fat (tallow). Lard. Sweets and desserts: Cookies. Cakes. Pies. Candy. Seasoning and other foods: Mayonnaise. Premade sauces and marinades. The items listed may not be a complete list. Talk with your dietitian about what dietary choices are right for you. Summary The Mediterranean diet includes both food and lifestyle choices. Eat a variety of fresh fruits and vegetables, beans, nuts, seeds, and whole grains. Limit the amount of red meat and sweets that you eat. Talk with your health care provider about whether it is safe for you to drink red wine in moderation. This means 1 glass a day for nonpregnant women and 2 glasses a day for men. A glass of wine equals 5 oz (150 mL). This information  is not intended to replace advice given to you by your health care provider. Make sure you discuss any questions you have with your health care provider. Document Released: 02/22/2016 Document Revised: 03/26/2016 Document Reviewed: 02/22/2016 Elsevier Interactive Patient Education  2017 [employer].

## 2024-01-13 NOTE — Progress Notes (Signed)
 Assessment/Plan:   Dementia due to Alzheimer's disease   Tammy Hughes is a very pleasant 89 y.o. RH female with a history of hypertension, hyperlipidemia, gastric cancer followed at the cancer center, pulmonary fibrosis, history of esophageal stricture, DM2 with latest A1c above 8, history of NSTEMI January 2016, depression, history of right upper extremity DVT in 2016 seen today in follow up for memory loss. Patient is currently on donepezil  10 mg daily and memantine  5 mg twice daily, tolerating well. Mild cognitive decline is noted, MMSE today is 20/30.  She is able to participate in her ADLs to her ability, no longer drives.  Mood is controlled.    Follow up in 6  months. Continue donepezil  10 mg daily and increase memantine  to 10  mg twice daily, side effects discussed  Continue mirtazapine  for sleep, mood and appetite, side effects discussed.  She is also on Lexapro  by PCP recommend good control of her cardiovascular risk factors she is on Plavix  daily      Subjective:    This patient is accompanied in the office by her husband who supplements the history.  Previous records as well as any outside.  She was last seen on February 5,2025, with MMSE 24/30    Any changes in memory since last visit?  About the samehusband says.  STM is worse than LTM.  Continues to do crossword puzzles and word finding,  going to [employer] and participating on some activities at her retirement community. repeats oneself?  Endorsed Disoriented when walking into a room? Denies    Leaving objects?  May misplace things but not in unusual places   Wandering behavior?  denies   Any personality changes since last visit?  Denies.   Any worsening depression?:  Denies.   Hallucinations or paranoia?  Denies.   Seizures? denies    Any sleep changes?  Sleeps well.  Denies vivid dreams, REM behavior or sleepwalking   Sleep apnea?   Denies.   Any hygiene concerns? Denies.  Independent of bathing and dressing?   Endorsed  Does the patient needs help with medications?  Daughter is in charge   Who is in charge of the finances?  Husband is in charge     Any changes in appetite?  Denies, drinks enough water        Patient have trouble swallowing? Denies.   Does the patient cook?  Chaney shines little she may forget the common recipes.  Denies any accidents Any headaches?   denies   Any vision changes?  Denies  Chronic pain?  Endorsed, due to arthritis of the knees, sometimes she takes Tylenol . Ambulates with difficulty?  She has limited mobility due to knee arthritis.  Recent falls or head injuries? Denies.     Unilateral weakness, numbness or tingling? denies   Any tremors?  Only when anxious Any anosmia?  Denies   Any incontinence of urine?  Has some incontinence, uses pads.   Any bowel dysfunction?   Denies      Patient lives with her husband  at [facility] community     Does the patient drive? No longer drives    History on Initial Assessment 12/28/2019: This is an 89 year old right-handed woman with a history of hypertension, hyperlipidemia, diabetes, stomach cancer, anxiety, depression, presenting for evaluation of memory loss. Her daughter [name] is present to provide additional information. She states I just can't remember. Tammy Hughes started noticing changes around 4 years ago after she underwent chemotherapy and radiation treatment,  worse in the past year. She repeats herself several times. She lives with her husband. She was diagnosed with cancer 4 years ago and was forgetting bill payments, her husband took over at that time. She denies getting lost driving. She forgets her medications, they had to count her pills and find that she has not taken them as instructed. She denies leaving the stove on but Tammy Hughes reports she has burned food, thinking she turned the burner off. She denies misplacing things. She is independent with dressing and bathing. Her 2 sisters and brother had Alzheimer's disease. No concussions  or alcohol use. MMSE 24/30 in PCP office last 09/2019.   She has infrequent headaches, occasional dizziness/vertigo, low back pain, urinary incontinence. She denies any diplopia, dysarthria/dysphagia, focal numbness/tingling/weakness, tremors, or anosmia. No falls. Sleep is not good sometimes, she occasionally takes naps. No wandering behavior. Her daughter notes she is a little more irritable than before, she does not like a lot of people around. No hallucinations or paranoia.     PREVIOUS MEDICATIONS:   CURRENT MEDICATIONS:  Outpatient Encounter Medications as of 01/13/2024  Medication Sig   Acetaminophen  (TYLENOL  PO) Take 500 mg by mouth every 6 (six) hours as needed (Pain).   clopidogrel  (PLAVIX ) 75 MG tablet Take 1 tablet (75 mg total) by mouth daily.   Continuous Glucose Sensor (FREESTYLE LIBRE 2 SENSOR) MISC CHANGE EVERY 14 DAYS   donepezil  (ARICEPT ) 10 MG tablet TAKE 1 TABLET BY MOUTH ONCE DAILY   escitalopram  (LEXAPRO ) 5 MG tablet Take 1 tablet (5 mg total) by mouth daily.   Glucagon  (GVOKE HYPOPEN  1-PACK) 1 MG/0.2ML SOAJ Inject 1 mg into the skin as needed (low blood sugar with impaired consciousness).   insulin  aspart protamine - aspart (NOVOLOG  MIX 70/30 FLEXPEN) (70-30) 100 UNIT/ML FlexPen Inject 17 Units into the skin daily with breakfast AND 9 Units daily with supper. (Patient taking differently: Inject 24 Units into the skin daily with breakfast AND 9 Units daily with supper.)   meclizine  (ANTIVERT ) 25 MG tablet TAKE 1 TABLET BY MOUTH THREE TIMES DAILY IF NEEDED FOR DIZZINESS   memantine  (NAMENDA ) 10 MG tablet Take 1 tablet twice a day   mirtazapine  (REMERON ) 15 MG tablet Take 1 tablet (15 mg total) by mouth at bedtime.   pantoprazole  (PROTONIX ) 40 MG tablet Take 1 tablet (40 mg total) by mouth daily.   rosuvastatin  (CRESTOR ) 5 MG tablet TAKE 1 TABLET BY MOUTH ONCE DAILY   [DISCONTINUED] donepezil  (ARICEPT ) 10 MG tablet TAKE 1 TABLET BY MOUTH ONCE DAILY   [DISCONTINUED] memantine   (NAMENDA ) 5 MG tablet Take 1 tablet (5 mg at night) for 2 weeks, then increase to 1 tablet (5 mg) twice a day   [DISCONTINUED] memantine  (NAMENDA ) 5 MG tablet Take 1 tablet twice a day   No facility-administered encounter medications on file as of 01/13/2024.       01/13/2024    3:00 PM 08/22/2023   11:00 AM 09/20/2021    8:00 AM  MMSE - Mini Mental State Exam  Orientation to time 3 4 5   Orientation to Place 3 3 5   Registration 3 3 3   Attention/ Calculation 3 5 4   Recall 1 1 2   Language- name 2 objects 2 2 2   Language- repeat 1 1 1   Language- follow 3 step command 3 3 3   Language- read & follow direction 1 1 1   Write a sentence 1 1 1   Copy design 0 0 0  Total score 21 24 27  No data to display          Objective:     PHYSICAL EXAMINATION:    VITALS:   Vitals:   01/13/24 1457  BP: (!) 149/63  Pulse: 89  Resp: 20  SpO2: 98%  Weight: 132 lb (59.9 kg)    GEN:  The patient appears stated age and is in NAD. HEENT:  Normocephalic, atraumatic.   Neurological examination:  General: NAD, well-groomed, appears stated age. Orientation: The patient is alert. Oriented to person, not to place and date Cranial nerves: There is good facial symmetry.Flat affect. The speech is fluent and clear, soft spoken. No aphasia or dysarthria. Fund of knowledge is appropriate. Recent and remote memory are impaired. Attention and concentration are reduced. Able to name objects and repeat phrases.  Hearing is intact to conversational tone.   Sensation: Sensation is intact to light touch throughout Motor: Strength is at least antigravity x4. DTR's 2/4 in UE/LE     Movement examination: Tone: There is normal tone in the UE/LE Abnormal movements:  no tremor.  No myoclonus.  No asterixis.   Coordination:  There is no decremation with RAM's. Normal finger to nose  Gait and Station: The patient has no difficulty arising out of a deep-seated chair without the use of the hands. The patient's  stride length is short Gait is cautious and mildly wider based    Thank you for allowing us  the opportunity to participate in the care of this nice patient. Please do not hesitate to contact us  for any questions or concerns.   Total time spent on today's visit was 20 minutes dedicated to this patient today, preparing to see patient, examining the patient, ordering tests and/or medications and counseling the patient, documenting clinical information in the EHR or other health record, independently interpreting results and communicating results to the patient/family, discussing treatment and goals, answering patient's questions and coordinating care.  Cc:  Kennyth Worth HERO, MD  Camie Sevin 01/13/2024 3:21 PM

## 2024-01-15 ENCOUNTER — Ambulatory Visit: Admitting: "Endocrinology

## 2024-01-15 ENCOUNTER — Encounter: Payer: Self-pay | Admitting: "Endocrinology

## 2024-01-15 VITALS — BP 122/70 | HR 85 | Ht 60.0 in | Wt 130.0 lb

## 2024-01-15 DIAGNOSIS — E782 Mixed hyperlipidemia: Secondary | ICD-10-CM | POA: Diagnosis not present

## 2024-01-15 DIAGNOSIS — Z794 Long term (current) use of insulin: Secondary | ICD-10-CM | POA: Diagnosis not present

## 2024-01-15 DIAGNOSIS — E1165 Type 2 diabetes mellitus with hyperglycemia: Secondary | ICD-10-CM

## 2024-01-15 LAB — POCT GLYCOSYLATED HEMOGLOBIN (HGB A1C): Hemoglobin A1C: 8 % — AB (ref 4.0–5.6)

## 2024-01-15 NOTE — Progress Notes (Signed)
 Outpatient Endocrinology Note Tammy Birmingham, MD  01/15/24   Tammy Hughes 1933/03/04 [id number]  Referring Provider: Kennyth Worth HERO, MD Primary Care Provider: Kennyth Worth HERO, MD Reason for consultation: Subjective   Assessment & Plan  Diagnoses and all orders for this visit:  Uncontrolled type 2 diabetes mellitus with hyperglycemia, with long-term current use of insulin  (HCC) -     POCT glycosylated hemoglobin (Hb A1C)  Long-term insulin  use (HCC)  Mixed hypercholesterolemia and hypertriglyceridemia   Diabetes Type II complicated by nephropathy  Lab Results  Component Value Date   GFR 46.44 (L) 08/12/2023   Hba1c goal less than 7, current Hba1c is  Lab Results  Component Value Date   HGBA1C 8.0 (A) 01/15/2024   Will recommend the following: NovoLog  Mix 70/30 insulin : 26- units in a.m. and 7 units before dinner-15 min before meals  Instructed to cut down on brunch carbohydrates Previously changed the libre alarm low setting from 70 to 80 to allow patient to take snacks to avoid low blood sugar, instructed to keep snacks by bedside Ordered glucagon  on 04/21/2023 and discussed the use at length with patient and her accompanying attendant (daughter)  No known contraindications to any of above medications  -Last LD and Tg are as follows: Lab Results  Component Value Date   LDLCALC 39 08/12/2023    Lab Results  Component Value Date   TRIG 211.0 (H) 08/12/2023   -Recommend rosuvastatin  5 mg QD -Follow low fat diet and exercise   -Blood pressure goal <140/90 - Microalbumin/creatinine goal < 30 -Last MA/Cr is as follows: Lab Results  Component Value Date   MICROALBUR 0.7 06/06/2009   -not on ACE/ARB  -diet changes including salt restriction -limit eating outside -counseled BP targets per standards of diabetes care -uncontrolled blood pressure can lead to retinopathy, nephropathy and cardiovascular and atherosclerotic heart disease  Reviewed and  counseled on: -A1C target -Blood sugar targets -Complications of uncontrolled diabetes  -Checking blood sugar before meals and bedtime and bring log next visit -All medications with mechanism of action and side effects -Hypoglycemia management: rule of 15's, Glucagon  Emergency Kit and medical alert ID -low-carb low-fat plate-method diet -At least 20 minutes of physical activity per day -Annual dilated retinal eye exam and foot exam -compliance and follow up needs -follow up as scheduled or earlier if problem gets worse  Call if blood sugar is less than 70 or consistently above 250    Take a 15 gm snack of carbohydrate at bedtime before you go to sleep if your blood sugar is less than 100.    If you are going to fast after midnight for a test or procedure, ask your physician for instructions on how to reduce/decrease your insulin  dose.    Call if blood sugar is less than 70 or consistently above 250  -Treating a low sugar by rule of 15  (15 gms of sugar every 15 min until sugar is more than 70) If you feel your sugar is low, test your sugar to be sure If your sugar is low (less than 70), then take 15 grams of a fast acting Carbohydrate (3-4 glucose tablets or glucose gel or 4 ounces of juice or regular soda) Recheck your sugar 15 min after treating low to make sure it is more than 70 If sugar is still less than 70, treat again with 15 grams of carbohydrate          Don't drive the hour of hypoglycemia  If unconscious/unable to eat or drink by mouth, use glucagon  injection or nasal spray baqsimi and call 911. Can repeat again in 15 min if still unconscious.  Return in about 3 months (around 04/16/2024).   I have reviewed current medications, nurse's notes, allergies, vital signs, past medical and surgical history, family medical history, and social history for this encounter. Counseled patient on symptoms, examination findings, lab findings, imaging results, treatment decisions and  monitoring and prognosis. The patient understood the recommendations and agrees with the treatment plan. All questions regarding treatment plan were fully answered.  Tammy Birmingham, MD  01/15/24   History of Present Illness Tammy Hughes is a 89 y.o. year old female who presents for follow up of Type II diabetes mellitus.  Tammy RUNNION was first diagnosed in 1995.   Diabetes education +  Diagnosis date: 1995  Current regimen: NovoLog  Mix 70/30 insulin : 24 units around 10am-12pm and 9 units around 7 pm before dinner-30 min before meals   Previous history:  Non-insulin  hypoglycemic drugs previously used:?  Metformin, detailed history not available Insulin  was started in 2005 Appears to have been on premixed insulin  for several years She is appears to have relatively stable control except since about early 2022  COMPLICATIONS -  MI/Stroke -  retinopathy -  neuropathy  + nephropathy  BLOOD SUGAR DATA CGM interpretation: At today's visit, we reviewed her CGM downloads. The full report is scanned in the media. Reviewing the CGM trends, BG are elevated in daytime, specially after brunch.  Physical Exam  BP 122/70   Pulse 85   Ht 5' (1.524 m)   Wt 130 lb (59 kg)   SpO2 95%   BMI 25.39 kg/m    Constitutional: well developed, well nourished Head: normocephalic, atraumatic Eyes: sclera anicteric, no redness Neck: supple Lungs: normal respiratory effort Neurology: alert and oriented Skin: dry, no appreciable rashes Musculoskeletal: no appreciable defects Psychiatric: normal mood and affect Diabetic Foot Exam - Simple   No data filed      Current Medications Patient's Medications  New Prescriptions   No medications on file  Previous Medications   ACETAMINOPHEN  (TYLENOL  PO)    Take 500 mg by mouth every 6 (six) hours as needed (Pain).   CLOPIDOGREL  (PLAVIX ) 75 MG TABLET    Take 1 tablet (75 mg total) by mouth daily.   CONTINUOUS GLUCOSE SENSOR (FREESTYLE LIBRE 2  SENSOR) MISC    CHANGE EVERY 14 DAYS   DONEPEZIL  (ARICEPT ) 10 MG TABLET    TAKE 1 TABLET BY MOUTH ONCE DAILY   ESCITALOPRAM  (LEXAPRO ) 5 MG TABLET    Take 1 tablet (5 mg total) by mouth daily.   GLUCAGON  (GVOKE HYPOPEN  1-PACK) 1 MG/0.2ML SOAJ    Inject 1 mg into the skin as needed (low blood sugar with impaired consciousness).   INSULIN  ASPART PROTAMINE - ASPART (NOVOLOG  MIX 70/30 FLEXPEN) (70-30) 100 UNIT/ML FLEXPEN    Inject 17 Units into the skin daily with breakfast AND 9 Units daily with supper.   MECLIZINE  (ANTIVERT ) 25 MG TABLET    TAKE 1 TABLET BY MOUTH THREE TIMES DAILY IF NEEDED FOR DIZZINESS   MEMANTINE  (NAMENDA ) 10 MG TABLET    Take 1 tablet twice a day   MIRTAZAPINE  (REMERON ) 15 MG TABLET    Take 1 tablet (15 mg total) by mouth at bedtime.   PANTOPRAZOLE  (PROTONIX ) 40 MG TABLET    Take 1 tablet (40 mg total) by mouth daily.   ROSUVASTATIN  (CRESTOR ) 5 MG TABLET  TAKE 1 TABLET BY MOUTH ONCE DAILY  Modified Medications   No medications on file  Discontinued Medications   No medications on file    Allergies Allergies  Allergen Reactions   Pirfenidone Diarrhea and Nausea And Vomiting   Aspirin      rash    Past Medical History Past Medical History:  Diagnosis Date   Allergy     SEASONAL   ANXIETY 03/08/2008   Cataract    REMOVED BILATERAL   Depression    DIVERTICULOSIS, COLON 03/08/2008   DVT (deep venous thrombosis) (HCC)    GERD 02/23/2007   HYPERCHOLESTEROLEMIA 02/01/2008   HYPERTENSION 03/08/2008   Insulin  dependent diabetes mellitus    OSTEOARTHRITIS 02/23/2007   OSTEOPOROSIS 02/23/2007   PERIPHERAL NEUROPATHY 02/23/2007   STD (sexually transmitted disease)    Stomach cancer (HCC) 07/20/14   invasive adenocarcinoma w/signet rings features   Urinary incontinence    Urolithiasis     Past Surgical History Past Surgical History:  Procedure Laterality Date   ABDOMINAL SURGERY     CARDIAC CATHETERIZATION N/A 02/02/2016   Procedure: Left Heart Cath and Coronary  Angiography;  Surgeon: Peter M [place], MD;  Location: [facility] INVASIVE CV LAB;  Service: Cardiovascular;  Laterality: N/A;   CHOLECYSTECTOMY  1995   ESOPHAGOGASTRODUODENOSCOPY  06/08/2002   GASTRECTOMY N/A 07/20/2014   Procedure: PROXIMAL GASTRECTOMY;  Surgeon: Jina Nephew, MD;  Location: [facility] OR;  Service: General;  Laterality: N/A;   GASTROJEJUNOSTOMY N/A 07/20/2014   Procedure: GASTROJEJUNOSTOMY;  Surgeon: Jina Nephew, MD;  Location: [facility] OR;  Service: General;  Laterality: N/A;   LAPAROSCOPY N/A 07/20/2014   Procedure: LAPAROSCOPY DIAGNOSTIC;  Surgeon: Jina Nephew, MD;  Location: [facility] OR;  Service: General;  Laterality: N/A;    Family History family history includes Cancer in her father, sister, and sister; Diabetes in her sister and sister; Other in her mother.  Social History Social History   Socioeconomic History   Marital status: Married    Spouse name: Not on file   Number of children: Not on file   Years of education: Not on file   Highest education level: Not on file  Occupational History   Occupation: Retired    Associate Professor: RETIRED  Tobacco Use   Smoking status: Never   Smokeless tobacco: Never  Vaping Use   Vaping status: Never Used  Substance and Sexual Activity   Alcohol use: No    Alcohol/week: 0.0 standard drinks of alcohol   Drug use: No   Sexual activity: Not Currently    Birth control/protection: Post-menopausal  Other Topics Concern   Not on file  Social History Narrative   Retired-Domestic Assistant   Right handed    Lives with husband    Lives in a one story home   Social Drivers of Health   Financial Resource Strain: Low Risk  (06/04/2023)   Overall Financial Resource Strain (CARDIA)    Difficulty of Paying Living Expenses: Not hard at all  Food Insecurity: No Food Insecurity (06/04/2023)   Hunger Vital Sign    Worried About Running Out of Food in the Last Year: Never true    Ran Out of Food in the Last Year: Never true  Transportation Needs: No  Transportation Needs (06/04/2023)   PRAPARE - Administrator, Civil Service (Medical): No    Lack of Transportation (Non-Medical): No  Physical Activity: Inactive (06/04/2023)   Exercise Vital Sign    Days of Exercise per Week: 0 days    Minutes of  Exercise per Session: 0 min  Stress: No Stress Concern Present (06/04/2023)   [employer] of Occupational Health - Occupational Stress Questionnaire    Feeling of Stress : Not at all  Social Connections: Moderately Integrated (06/04/2023)   Social Connection and Isolation Panel    Frequency of Communication with Friends and Family: More than three times a week    Frequency of Social Gatherings with Friends and Family: Twice a week    Attends Religious Services: 1 to 4 times per year    Active Member of Clubs or Organizations: No    Attends Banker Meetings: Never    Marital Status: Married  Catering manager Violence: Not At Risk (06/04/2023)   Humiliation, Afraid, Rape, and Kick questionnaire    Fear of Current or Ex-Partner: No    Emotionally Abused: No    Physically Abused: No    Sexually Abused: No    Lab Results  Component Value Date   HGBA1C 8.0 (A) 01/15/2024   HGBA1C 8.3 (A) 11/03/2023   HGBA1C 8.5 (A) 06/23/2023   Lab Results  Component Value Date   CHOL 126 08/12/2023   Lab Results  Component Value Date   HDL 44.70 08/12/2023   Lab Results  Component Value Date   LDLCALC 39 08/12/2023   Lab Results  Component Value Date   TRIG 211.0 (H) 08/12/2023   Lab Results  Component Value Date   CHOLHDL 3 08/12/2023   Lab Results  Component Value Date   CREATININE 1.07 08/12/2023   Lab Results  Component Value Date   GFR 46.44 (L) 08/12/2023   Lab Results  Component Value Date   MICROALBUR 0.7 06/06/2009      Component Value Date/Time   NA 138 08/12/2023 1358   NA 139 11/07/2014 1001   K 4.0 08/12/2023 1358   K 4.6 11/07/2014 1001   CL 105 08/12/2023 1358   CO2 26  08/12/2023 1358   CO2 26 11/07/2014 1001   GLUCOSE 251 (H) 08/12/2023 1358   GLUCOSE 189 (H) 11/07/2014 1001   BUN 18 08/12/2023 1358   BUN 15.8 11/07/2014 1001   CREATININE 1.07 08/12/2023 1358   CREATININE 0.90 (H) 06/21/2020 1402   CREATININE 0.8 11/07/2014 1001   CALCIUM  10.2 08/12/2023 1358   CALCIUM  9.8 11/07/2014 1001   PROT 7.2 08/12/2023 1358   PROT 7.1 11/07/2014 1001   ALBUMIN 3.9 08/12/2023 1358   ALBUMIN 2.8 (L) 11/07/2014 1001   AST 21 08/12/2023 1358   AST 23 11/07/2014 1001   ALT 15 08/12/2023 1358   ALT 15 11/07/2014 1001   ALKPHOS 73 08/12/2023 1358   ALKPHOS 99 11/07/2014 1001   BILITOT 0.3 08/12/2023 1358   BILITOT 0.28 11/07/2014 1001   GFRNONAA 50 (L) 04/10/2023 1240   GFRNONAA 58 (L) 03/22/2020 1359   GFRAA 67 03/22/2020 1359      Latest Ref Rng & Units 08/12/2023    1:58 PM 04/14/2023    8:19 AM 04/10/2023   12:40 PM  BMP  Glucose 70 - 99 mg/dL 748  862  755   BUN 6 - 23 mg/dL 18  19  15    Creatinine 0.40 - 1.20 mg/dL 8.92  8.70  8.91   Sodium 135 - 145 mEq/L 138  142  139   Potassium 3.5 - 5.1 mEq/L 4.0  4.0  4.2   Chloride 96 - 112 mEq/L 105  104  104   CO2 19 - 32 mEq/L 26  29  26   Calcium  8.4 - 10.5 mg/dL 89.7  89.8  9.7        Component Value Date/Time   WBC 4.2 08/12/2023 1358   RBC 4.07 08/12/2023 1358   HGB 12.2 08/12/2023 1358   HGB 11.0 (L) 11/07/2014 1001   HCT 38.1 08/12/2023 1358   HCT 34.3 (L) 11/07/2014 1001   PLT 262.0 08/12/2023 1358   PLT 242 11/07/2014 1001   MCV 93.7 08/12/2023 1358   MCV 89.6 11/07/2014 1001   MCH 30.7 04/10/2023 1240   MCHC 32.1 08/12/2023 1358   RDW 13.5 08/12/2023 1358   RDW 21.5 (H) 11/07/2014 1001   LYMPHSABS 1.3 04/10/2023 1240   LYMPHSABS 0.4 (L) 11/07/2014 1001   MONOABS 0.4 04/10/2023 1240   MONOABS 0.8 11/07/2014 1001   EOSABS 0.1 04/10/2023 1240   EOSABS 0.2 11/07/2014 1001   BASOSABS 0.0 04/10/2023 1240   BASOSABS 0.0 11/07/2014 1001     Parts of this note may have been  dictated using voice recognition software. There may be variances in spelling and vocabulary which are unintentional. Not all errors are proofread. Please notify the dino if any discrepancies are noted or if the meaning of any statement is not clear.

## 2024-01-15 NOTE — Patient Instructions (Signed)

## 2024-01-20 ENCOUNTER — Encounter: Payer: Self-pay | Admitting: "Endocrinology

## 2024-02-10 ENCOUNTER — Other Ambulatory Visit: Payer: Self-pay | Admitting: "Endocrinology

## 2024-02-10 ENCOUNTER — Ambulatory Visit: Payer: Medicare PPO | Admitting: Family Medicine

## 2024-02-11 ENCOUNTER — Telehealth: Payer: Self-pay

## 2024-02-11 NOTE — Telephone Encounter (Signed)
 Patient was identified as falling into the [place] Measure - Diabetes.   Patient was: Appointment scheduled for lab or office visit for A1c.  Pt being followed by Endocrinology and appt scheduled

## 2024-02-16 ENCOUNTER — Ambulatory Visit: Admitting: Family Medicine

## 2024-02-20 ENCOUNTER — Ambulatory Visit: Payer: Medicare PPO | Admitting: Physician Assistant

## 2024-02-24 ENCOUNTER — Ambulatory Visit: Admitting: Family Medicine

## 2024-04-16 ENCOUNTER — Encounter: Payer: Self-pay | Admitting: "Endocrinology

## 2024-04-16 ENCOUNTER — Ambulatory Visit: Admitting: "Endocrinology

## 2024-04-16 VITALS — BP 124/60 | HR 61 | Resp 16 | Wt 134.0 lb

## 2024-04-16 DIAGNOSIS — E1165 Type 2 diabetes mellitus with hyperglycemia: Secondary | ICD-10-CM | POA: Diagnosis not present

## 2024-04-16 DIAGNOSIS — Z794 Long term (current) use of insulin: Secondary | ICD-10-CM | POA: Diagnosis not present

## 2024-04-16 DIAGNOSIS — E782 Mixed hyperlipidemia: Secondary | ICD-10-CM

## 2024-04-16 LAB — POCT GLYCOSYLATED HEMOGLOBIN (HGB A1C): Hemoglobin A1C: 8.4 % — AB (ref 4.0–5.6)

## 2024-04-16 NOTE — Progress Notes (Signed)
 Outpatient Endocrinology Note Tammy Birmingham, MD  04/16/24   Tammy Hughes March 08, 1933 [id number]  Referring Provider: Kennyth Worth HERO, MD Primary Care Provider: Kennyth Worth HERO, MD Reason for consultation: Subjective   Assessment & Plan  Diagnoses and all orders for this visit:  Uncontrolled type 2 diabetes mellitus with hyperglycemia, with long-term current use of insulin  (HCC) -     POCT glycosylated hemoglobin (Hb A1C) -     Microalbumin / creatinine urine ratio  Long-term insulin  use (HCC)  Mixed hypercholesterolemia and hypertriglyceridemia   Diabetes Type II complicated by nephropathy  Lab Results  Component Value Date   GFR 46.44 (L) 08/12/2023   Hba1c goal less than 7, current Hba1c is  Lab Results  Component Value Date   HGBA1C 8.4 (A) 04/16/2024   Will recommend the following: NovoLog  Mix 70/30 insulin : 26- units in a.m. and 7 units before dinner-15 min before meals  Daughter is not interested in changing to long and short acting insulin  separately Previously,  instructed to cut down on brunch carbohydrates Previously changed the [place] alarm low setting from 70 to 80 to allow patient to take snacks to avoid low blood sugar, instructed to keep snacks by bedside Ordered glucagon  on 04/21/2023 and discussed the use at length with patient and her accompanying attendant (daughter)  No known contraindications to any of above medications  -Last LD and Tg are as follows: Lab Results  Component Value Date   LDLCALC 39 08/12/2023    Lab Results  Component Value Date   TRIG 211.0 (H) 08/12/2023   -Recommend rosuvastatin  5 mg QD -Follow low fat diet and exercise   -Blood pressure goal <140/90 - Microalbumin/creatinine goal < 30 -Last MA/Cr is as follows: Lab Results  Component Value Date   MICROALBUR 0.7 06/06/2009   -not on ACE/ARB  -diet changes including salt restriction -limit eating outside -counseled BP targets per standards of diabetes  care -uncontrolled blood pressure can lead to retinopathy, nephropathy and cardiovascular and atherosclerotic heart disease  Reviewed and counseled on: -A1C target -Blood sugar targets -Complications of uncontrolled diabetes  -Checking blood sugar before meals and bedtime and bring log next visit -All medications with mechanism of action and side effects -Hypoglycemia management: rule of 15's, Glucagon  Emergency Kit and medical alert ID -low-carb low-fat plate-method diet -At least 20 minutes of physical activity per day -Annual dilated retinal eye exam and foot exam -compliance and follow up needs -follow up as scheduled or earlier if problem gets worse  Call if blood sugar is less than 70 or consistently above 250    Take a 15 gm snack of carbohydrate at bedtime before you go to sleep if your blood sugar is less than 100.    If you are going to fast after midnight for a test or procedure, ask your physician for instructions on how to reduce/decrease your insulin  dose.    Call if blood sugar is less than 70 or consistently above 250  -Treating a low sugar by rule of 15  (15 gms of sugar every 15 min until sugar is more than 70) If you feel your sugar is low, test your sugar to be sure If your sugar is low (less than 70), then take 15 grams of a fast acting Carbohydrate (3-4 glucose tablets or glucose gel or 4 ounces of juice or regular soda) Recheck your sugar 15 min after treating low to make sure it is more than 70 If sugar is still less  than 70, treat again with 15 grams of carbohydrate          Don't drive the hour of hypoglycemia  If unconscious/unable to eat or drink by mouth, use glucagon  injection or nasal spray baqsimi and call 911. Can repeat again in 15 min if still unconscious.  Return in about 4 months (around 08/17/2024).   I have reviewed current medications, nurse's notes, allergies, vital signs, past medical and surgical history, family medical history, and social  history for this encounter. Counseled patient on symptoms, examination findings, lab findings, imaging results, treatment decisions and monitoring and prognosis. The patient understood the recommendations and agrees with the treatment plan. All questions regarding treatment plan were fully answered.  Tammy Birmingham, MD  04/16/24   History of Present Illness Tammy Hughes is a 89 y.o. year old female who presents for follow up of Type II diabetes mellitus.  Tammy Hughes was first diagnosed in 1995.   Diabetes education +  Diagnosis date: 1995  Current regimen: NovoLog  Mix 70/30 insulin : 26 units around 10am-12pm and 7 units around 7 pm before dinner-30 min before meals   Previous history:  Non-insulin  hypoglycemic drugs previously used:?  Metformin, detailed history not available Insulin  was started in 2005 Appears to have been on premixed insulin  for several years She is appears to have relatively stable control except since about early 2022  COMPLICATIONS -  MI/Stroke -  retinopathy -  neuropathy  + nephropathy  BLOOD SUGAR DATA CGM interpretation: At today's visit, we reviewed her CGM downloads. The full report is scanned in the media. Reviewing the CGM trends, BG are mildly elevated in the morning and evening with some lows before supper.  Physical Exam  BP 124/60   Pulse 61   Resp 16   Wt 134 lb (60.8 kg)   SpO2 96%   BMI 26.17 kg/m    Constitutional: well developed, well nourished Head: normocephalic, atraumatic Eyes: sclera anicteric, no redness Neck: supple Lungs: normal respiratory effort Neurology: alert and oriented Skin: dry, no appreciable rashes Musculoskeletal: no appreciable defects Psychiatric: normal mood and affect Diabetic Foot Exam - Simple   No data filed      Current Medications Patient's Medications  New Prescriptions   No medications on file  Previous Medications   ACETAMINOPHEN  (TYLENOL  PO)    Take 500 mg by mouth every 6 (six)  hours as needed (Pain).   CLOPIDOGREL  (PLAVIX ) 75 MG TABLET    Take 1 tablet (75 mg total) by mouth daily.   CONTINUOUS GLUCOSE SENSOR (FREESTYLE LIBRE 2 SENSOR) MISC    CHANGE EVERY 14 DAYS   DONEPEZIL  (ARICEPT ) 10 MG TABLET    TAKE 1 TABLET BY MOUTH ONCE DAILY   ESCITALOPRAM  (LEXAPRO ) 5 MG TABLET    Take 1 tablet (5 mg total) by mouth daily.   GLUCAGON  (GVOKE HYPOPEN  1-PACK) 1 MG/0.2ML SOAJ    Inject 1 mg into the skin as needed (low blood sugar with impaired consciousness).   INSULIN  ASPART PROTAMINE - ASPART (NOVOLOG  MIX 70/30 FLEXPEN) (70-30) 100 UNIT/ML FLEXPEN    Inject 17 Units into the skin daily with breakfast AND 9 Units daily with supper.   MECLIZINE  (ANTIVERT ) 25 MG TABLET    TAKE 1 TABLET BY MOUTH THREE TIMES DAILY IF NEEDED FOR DIZZINESS   MEMANTINE  (NAMENDA ) 10 MG TABLET    Take 1 tablet twice a day   MIRTAZAPINE  (REMERON ) 15 MG TABLET    Take 1 tablet (15 mg total) by  mouth at bedtime.   PANTOPRAZOLE  (PROTONIX ) 40 MG TABLET    Take 1 tablet (40 mg total) by mouth daily.   ROSUVASTATIN  (CRESTOR ) 5 MG TABLET    TAKE 1 TABLET BY MOUTH ONCE DAILY  Modified Medications   No medications on file  Discontinued Medications   No medications on file    Allergies Allergies  Allergen Reactions   Pirfenidone Diarrhea and Nausea And Vomiting   Aspirin      rash    Past Medical History Past Medical History:  Diagnosis Date   Allergy     SEASONAL   ANXIETY 03/08/2008   Cataract    REMOVED BILATERAL   Depression    DIVERTICULOSIS, COLON 03/08/2008   DVT (deep venous thrombosis) (HCC)    GERD 02/23/2007   HYPERCHOLESTEROLEMIA 02/01/2008   HYPERTENSION 03/08/2008   Insulin  dependent diabetes mellitus    OSTEOARTHRITIS 02/23/2007   OSTEOPOROSIS 02/23/2007   PERIPHERAL NEUROPATHY 02/23/2007   STD (sexually transmitted disease)    Stomach cancer (HCC) 07/20/14   invasive adenocarcinoma w/signet rings features   Urinary incontinence    Urolithiasis     Past Surgical History Past  Surgical History:  Procedure Laterality Date   ABDOMINAL SURGERY     CARDIAC CATHETERIZATION N/A 02/02/2016   Procedure: Left Heart Cath and Coronary Angiography;  Surgeon: Peter M Swaz[place], MD;  Location: [facility] INVASIVE CV LAB;  Service: Cardiovascular;  Laterality: N/A;   CHOLECYSTECTOMY  1995   ESOPHAGOGASTRODUODENOSCOPY  06/08/2002   GASTRECTOMY N/A 07/20/2014   Procedure: PROXIMAL GASTRECTOMY;  Surgeon: Jina Nephew, MD;  Location: [facility] OR;  Service: General;  Laterality: N/A;   GASTROJEJUNOSTOMY N/A 07/20/2014   Procedure: GASTROJEJUNOSTOMY;  Surgeon: Jina Nephew, MD;  Location: [facility] OR;  Service: General;  Laterality: N/A;   LAPAROSCOPY N/A 07/20/2014   Procedure: LAPAROSCOPY DIAGNOSTIC;  Surgeon: Jina Nephew, MD;  Location: [facility] OR;  Service: General;  Laterality: N/A;    Family History family history includes Cancer in her father, sister, and sister; Diabetes in her sister and sister; Other in her mother.  Social History Social History   Socioeconomic History   Marital status: Married    Spouse name: Not on file   Number of children: Not on file   Years of education: Not on file   Highest education level: Not on file  Occupational History   Occupation: Retired    Associate Professor: RETIRED  Tobacco Use   Smoking status: Never   Smokeless tobacco: Never  Vaping Use   Vaping status: Never Used  Substance and Sexual Activity   Alcohol use: No    Alcohol/week: 0.0 standard drinks of alcohol   Drug use: No   Sexual activity: Not Currently    Birth control/protection: Post-menopausal  Other Topics Concern   Not on file  Social History Narrative   Retired-Domestic Assistant   Right handed    Lives with husband    Lives in a one story home   Social Drivers of Health   Financial Resource Strain: Low Risk  (06/04/2023)   Overall Financial Resource Strain (CARDIA)    Difficulty of Paying Living Expenses: Not hard at all  Food Insecurity: No Food Insecurity (06/04/2023)   Hunger Vital Sign     Worried About Running Out of Food in the Last Year: Never true    Ran Out of Food in the Last Year: Never true  Transportation Needs: No Transportation Needs (06/04/2023)   PRAPARE - Administrator, Civil Service (Medical): No  Lack of Transportation (Non-Medical): No  Physical Activity: Inactive (06/04/2023)   Exercise Vital Sign    Days of Exercise per Week: 0 days    Minutes of Exercise per Session: 0 min  Stress: No Stress Concern Present (06/04/2023)   [employer] of Occupational Health - Occupational Stress Questionnaire    Feeling of Stress : Not at all  Social Connections: Moderately Integrated (06/04/2023)   Social Connection and Isolation Panel    Frequency of Communication with Friends and Family: More than three times a week    Frequency of Social Gatherings with Friends and Family: Twice a week    Attends Religious Services: 1 to 4 times per year    Active Member of Clubs or Organizations: No    Attends Banker Meetings: Never    Marital Status: Married  Catering manager Violence: Not At Risk (06/04/2023)   Humiliation, Afraid, Rape, and Kick questionnaire    Fear of Current or Ex-Partner: No    Emotionally Abused: No    Physically Abused: No    Sexually Abused: No    Lab Results  Component Value Date   HGBA1C 8.4 (A) 04/16/2024   HGBA1C 8.0 (A) 01/15/2024   HGBA1C 8.3 (A) 11/03/2023   Lab Results  Component Value Date   CHOL 126 08/12/2023   Lab Results  Component Value Date   HDL 44.70 08/12/2023   Lab Results  Component Value Date   LDLCALC 39 08/12/2023   Lab Results  Component Value Date   TRIG 211.0 (H) 08/12/2023   Lab Results  Component Value Date   CHOLHDL 3 08/12/2023   Lab Results  Component Value Date   CREATININE 1.07 08/12/2023   Lab Results  Component Value Date   GFR 46.44 (L) 08/12/2023   Lab Results  Component Value Date   MICROALBUR 0.7 06/06/2009      Component Value Date/Time    NA 138 08/12/2023 1358   NA 139 11/07/2014 1001   K 4.0 08/12/2023 1358   K 4.6 11/07/2014 1001   CL 105 08/12/2023 1358   CO2 26 08/12/2023 1358   CO2 26 11/07/2014 1001   GLUCOSE 251 (H) 08/12/2023 1358   GLUCOSE 189 (H) 11/07/2014 1001   BUN 18 08/12/2023 1358   BUN 15.8 11/07/2014 1001   CREATININE 1.07 08/12/2023 1358   CREATININE 0.90 (H) 06/21/2020 1402   CREATININE 0.8 11/07/2014 1001   CALCIUM  10.2 08/12/2023 1358   CALCIUM  9.8 11/07/2014 1001   PROT 7.2 08/12/2023 1358   PROT 7.1 11/07/2014 1001   ALBUMIN 3.9 08/12/2023 1358   ALBUMIN 2.8 (L) 11/07/2014 1001   AST 21 08/12/2023 1358   AST 23 11/07/2014 1001   ALT 15 08/12/2023 1358   ALT 15 11/07/2014 1001   ALKPHOS 73 08/12/2023 1358   ALKPHOS 99 11/07/2014 1001   BILITOT 0.3 08/12/2023 1358   BILITOT 0.28 11/07/2014 1001   GFRNONAA 50 (L) 04/10/2023 1240   GFRNONAA 58 (L) 03/22/2020 1359   GFRAA 67 03/22/2020 1359      Latest Ref Rng & Units 08/12/2023    1:58 PM 04/14/2023    8:19 AM 04/10/2023   12:40 PM  BMP  Glucose 70 - 99 mg/dL 748  862  755   BUN 6 - 23 mg/dL 18  19  15    Creatinine 0.40 - 1.20 mg/dL 8.92  8.70  8.91   Sodium 135 - 145 mEq/L 138  142  139   Potassium 3.5 -  5.1 mEq/L 4.0  4.0  4.2   Chloride 96 - 112 mEq/L 105  104  104   CO2 19 - 32 mEq/L 26  29  26    Calcium  8.4 - 10.5 mg/dL 89.7  89.8  9.7        Component Value Date/Time   WBC 4.2 08/12/2023 1358   RBC 4.07 08/12/2023 1358   HGB 12.2 08/12/2023 1358   HGB 11.0 (L) 11/07/2014 1001   HCT 38.1 08/12/2023 1358   HCT 34.3 (L) 11/07/2014 1001   PLT 262.0 08/12/2023 1358   PLT 242 11/07/2014 1001   MCV 93.7 08/12/2023 1358   MCV 89.6 11/07/2014 1001   MCH 30.7 04/10/2023 1240   MCHC 32.1 08/12/2023 1358   RDW 13.5 08/12/2023 1358   RDW 21.5 (H) 11/07/2014 1001   LYMPHSABS 1.3 04/10/2023 1240   LYMPHSABS 0.4 (L) 11/07/2014 1001   MONOABS 0.4 04/10/2023 1240   MONOABS 0.8 11/07/2014 1001   EOSABS 0.1 04/10/2023 1240    EOSABS 0.2 11/07/2014 1001   BASOSABS 0.0 04/10/2023 1240   BASOSABS 0.0 11/07/2014 1001     Parts of this note may have been dictated using voice recognition software. There may be variances in spelling and vocabulary which are unintentional. Not all errors are proofread. Please notify the dino if any discrepancies are noted or if the meaning of any statement is not clear.

## 2024-04-16 NOTE — Patient Instructions (Signed)

## 2024-04-17 LAB — MICROALBUMIN / CREATININE URINE RATIO
Creatinine, Urine: 70 mg/dL (ref 20–275)
Microalb Creat Ratio: 173 mg/g{creat} — ABNORMAL HIGH (ref <30)
Microalb, Ur: 12.1 mg/dL

## 2024-05-06 ENCOUNTER — Encounter: Payer: Self-pay | Admitting: "Endocrinology

## 2024-05-06 DIAGNOSIS — E1165 Type 2 diabetes mellitus with hyperglycemia: Secondary | ICD-10-CM

## 2024-05-07 MED ORDER — NOVOLOG MIX 70/30 FLEXPEN (70-30) 100 UNIT/ML ~~LOC~~ SUPN: PEN_INJECTOR | SUBCUTANEOUS | 3 refills | Status: AC

## 2024-05-20 ENCOUNTER — Inpatient Hospital Stay: Payer: Medicare HMO | Attending: Oncology | Admitting: Oncology

## 2024-05-20 ENCOUNTER — Inpatient Hospital Stay

## 2024-05-20 VITALS — BP 137/60 | HR 74 | Temp 98.2°F | Resp 18 | Wt 133.5 lb

## 2024-05-20 DIAGNOSIS — I252 Old myocardial infarction: Secondary | ICD-10-CM | POA: Insufficient documentation

## 2024-05-20 DIAGNOSIS — F32A Depression, unspecified: Secondary | ICD-10-CM | POA: Insufficient documentation

## 2024-05-20 DIAGNOSIS — E119 Type 2 diabetes mellitus without complications: Secondary | ICD-10-CM | POA: Insufficient documentation

## 2024-05-20 DIAGNOSIS — Z23 Encounter for immunization: Secondary | ICD-10-CM

## 2024-05-20 DIAGNOSIS — J8410 Pulmonary fibrosis, unspecified: Secondary | ICD-10-CM | POA: Diagnosis not present

## 2024-05-20 DIAGNOSIS — Z86718 Personal history of other venous thrombosis and embolism: Secondary | ICD-10-CM | POA: Insufficient documentation

## 2024-05-20 DIAGNOSIS — Z85028 Personal history of other malignant neoplasm of stomach: Secondary | ICD-10-CM | POA: Insufficient documentation

## 2024-05-20 DIAGNOSIS — C169 Malignant neoplasm of stomach, unspecified: Secondary | ICD-10-CM | POA: Diagnosis not present

## 2024-05-20 MED ORDER — INFLUENZA VAC SPLIT HIGH-DOSE 0.5 ML IM SUSY
0.5000 mL | PREFILLED_SYRINGE | Freq: Once | INTRAMUSCULAR | Status: AC
  Administered 2024-05-20: 0.5 mL via INTRAMUSCULAR
  Filled 2024-05-20: qty 0.5

## 2024-05-20 NOTE — Progress Notes (Signed)
  [facility] OFFICE PROGRESS NOTE   Diagnosis: Gastric cancer  INTERVAL HISTORY:   Tammy Hughes returns for a scheduled visit.  She is here with her daughter.  She reports good appetite.  No pain.  No dyspnea.  No nausea.  No symptom of recurrent thrombosis.  No complaint.  Objective:  Vital signs in last 24 hours:  Blood pressure 137/60, pulse 74, temperature 98.2 F (36.8 C), temperature source Temporal, resp. rate 18, weight 133 lb 8 oz (60.6 kg), SpO2 98%.   Lymphatics: No cervical, supraclavicular, axillary, or inguinal nodes Resp: Diffuse bilateral inspiratory rales, no respiratory distress Cardio: Regular rate and rhythm GI: No hepatosplenomegaly, soft, nontender, no mass, no apparent ascites Vascular: Trace edema at the right lower leg Neuro: Alert, follows commands  Lab Results:  Lab Results  Component Value Date   WBC 4.2 08/12/2023   HGB 12.2 08/12/2023   HCT 38.1 08/12/2023   MCV 93.7 08/12/2023   PLT 262.0 08/12/2023   NEUTROABS 3.6 04/10/2023    CMP  Lab Results  Component Value Date   NA 138 08/12/2023   K 4.0 08/12/2023   CL 105 08/12/2023   CO2 26 08/12/2023   GLUCOSE 251 (H) 08/12/2023   BUN 18 08/12/2023   CREATININE 1.07 08/12/2023   CALCIUM  10.2 08/12/2023   PROT 7.2 08/12/2023   ALBUMIN 3.9 08/12/2023   AST 21 08/12/2023   ALT 15 08/12/2023   ALKPHOS 73 08/12/2023   BILITOT 0.3 08/12/2023   GFRNONAA 50 (L) 04/10/2023   GFRAA 67 03/22/2020    Medications: I have reviewed the patient's current medications.   Assessment/Plan: Gastric cancer, status post an endoscopic biopsy of a lesser curvature mass on 05/31/2014 confirming adenocarcinoma Staging CTs of the chest and abdomen on 06/08/2014 revealed no evidence of metastatic disease Proximal gastrectomy and feeding jejunostomy 07/20/2014, pT2,pN1 Adjuvant weekly 5-Fu/leucovorin  09/14/2014, 09/21/2014, 09/28/2014 and 10/05/2014 Initiation of radiation and infusional 5-FU  10/17/2014 5-fluorouracil  pump discontinued on 10/27/2014 due to toxicity. Radiation discontinued 11/02/2014    Pulmonary fibrosis Esophageal stricture, status post a dilatation procedure 05/31/2014 Diabetes Postoperative ventricle tachycardia, NSTEMI January 2016 Hospital-acquired pneumonia January 2016 CT abdomen/pelvis 09/02/2014 with postsurgical changes. Focal fluid collection in the midline abutting the distal greater curvature of the stomach measuring 2.2 x 2.9 x 3.4 cm. Mild stranding in the adjacent fat. PICC placement 10/17/2014 Rash. Most likely related to the 5-fluorouracil . Resolved. Right upper extremity DVT 10/25/2014. She completed a course of Xarelto . Hospitalization 10/27/2014 through 10/28/2014 nausea/vomiting and left arm discomfort. Noted to have skin toxicity from the 5-fluorouracil . The 5-fluorouracil  was discontinued. Depression. She continues Remeron .  Improved.      Disposition: Tammy Hughes is in clinical remission from gastric cancer.  She is 10 years out from diagnosis.  She would like to continue follow-up at the cancer center.  She will return for an office visit in 1 year. She is followed by pulmonary medicine for pulmonary fibrosis.  She is followed by neurology for Alzheimer's dementia.  Tammy Hughes received an influenza vaccine today.  I recommended she have a pneumococcal 21 vaccine.  Arley Hof, MD  05/20/2024  11:58 AM

## 2024-06-14 ENCOUNTER — Ambulatory Visit: Admitting: Family Medicine

## 2024-06-14 ENCOUNTER — Encounter: Payer: Self-pay | Admitting: Family Medicine

## 2024-06-14 VITALS — BP 138/76 | HR 89 | Temp 97.2°F | Ht 60.0 in | Wt 134.8 lb

## 2024-06-14 DIAGNOSIS — F339 Major depressive disorder, recurrent, unspecified: Secondary | ICD-10-CM

## 2024-06-14 DIAGNOSIS — I152 Hypertension secondary to endocrine disorders: Secondary | ICD-10-CM

## 2024-06-14 DIAGNOSIS — F411 Generalized anxiety disorder: Secondary | ICD-10-CM

## 2024-06-14 DIAGNOSIS — E1159 Type 2 diabetes mellitus with other circulatory complications: Secondary | ICD-10-CM

## 2024-06-14 DIAGNOSIS — E785 Hyperlipidemia, unspecified: Secondary | ICD-10-CM

## 2024-06-14 DIAGNOSIS — E1169 Type 2 diabetes mellitus with other specified complication: Secondary | ICD-10-CM

## 2024-06-14 DIAGNOSIS — F325 Major depressive disorder, single episode, in full remission: Secondary | ICD-10-CM

## 2024-06-14 LAB — LIPID PANEL
Cholesterol: 132 mg/dL (ref 0–200)
HDL: 46.1 mg/dL (ref >39.00)
LDL Cholesterol: 56 mg/dL (ref 0–99)
NonHDL: 85.74
Total CHOL/HDL Ratio: 3
Triglycerides: 149 mg/dL (ref 0.0–149.0)
VLDL: 29.8 mg/dL (ref 0.0–40.0)

## 2024-06-14 LAB — CBC
HCT: 37.1 % (ref 36.0–46.0)
Hemoglobin: 12.2 g/dL (ref 12.0–15.0)
MCHC: 32.7 g/dL (ref 30.0–36.0)
MCV: 92 fl (ref 78.0–100.0)
Platelets: 262 K/uL (ref 150.0–400.0)
RBC: 4.04 Mil/uL (ref 3.87–5.11)
RDW: 12.9 % (ref 11.5–15.5)
WBC: 4.4 K/uL (ref 4.0–10.5)

## 2024-06-14 LAB — COMPREHENSIVE METABOLIC PANEL WITH GFR
ALT: 14 U/L (ref 0–35)
AST: 20 U/L (ref 0–37)
Albumin: 3.8 g/dL (ref 3.5–5.2)
Alkaline Phosphatase: 73 U/L (ref 39–117)
BUN: 15 mg/dL (ref 6–23)
CO2: 30 meq/L (ref 19–32)
Calcium: 11.1 mg/dL — ABNORMAL HIGH (ref 8.4–10.5)
Chloride: 105 meq/L (ref 96–112)
Creatinine, Ser: 1.22 mg/dL — ABNORMAL HIGH (ref 0.40–1.20)
GFR: 39.44 mL/min — ABNORMAL LOW (ref >60.00)
Glucose, Bld: 222 mg/dL — ABNORMAL HIGH (ref 70–99)
Potassium: 4.2 meq/L (ref 3.5–5.1)
Sodium: 143 meq/L (ref 135–145)
Total Bilirubin: 0.4 mg/dL (ref 0.2–1.2)
Total Protein: 7.5 g/dL (ref 6.0–8.3)

## 2024-06-14 LAB — TSH: TSH: 0.57 u[IU]/mL (ref 0.35–5.50)

## 2024-06-14 LAB — VITAMIN B12: Vitamin B-12: 337 pg/mL (ref 211–911)

## 2024-06-14 LAB — VITAMIN D 25 HYDROXY (VIT D DEFICIENCY, FRACTURES): VITD: 14.76 ng/mL — ABNORMAL LOW (ref 30.00–100.00)

## 2024-06-14 MED ORDER — TRIAMCINOLONE ACETONIDE 0.5 % EX OINT: 1.0000 | TOPICAL_OINTMENT | Freq: Two times a day (BID) | CUTANEOUS | 0 refills | Status: DC

## 2024-06-14 MED ORDER — GABAPENTIN 100 MG PO CAPS: 100.0000 mg | ORAL_CAPSULE | Freq: Three times a day (TID) | ORAL | 3 refills | Status: DC

## 2024-06-14 NOTE — Assessment & Plan Note (Signed)
 At goal today.  We will check labs

## 2024-06-14 NOTE — Assessment & Plan Note (Signed)
 Had a lengthy discussion with the patient and her husband today regarding anxiety which seems to have worsened recently.  She is currently on Remeron  15 mg daily and Lexapro  5 mg daily though they are interested in adjusting medications as they do not feel like her symptoms are currently adequately controlled.  She is also having a few repetitive behaviors including picking a scab on her scalp and having her hand.  We did discuss potential treatment options including increasing dose of Lexapro  versus add on of augmenting medication.  They would like to try to augment current regimen.  We discussed potential treatment options.  Will add on gabapentin 100 mg nightly though they can increase to 2-3 times daily as needed over the next few weeks.  They will follow-up with us  in a few weeks via MyChart and we can adjust as needed.  Depending on response to gabapentin may consider trial of atypical antipsychotic including Abilify or Seroquel.

## 2024-06-14 NOTE — Assessment & Plan Note (Signed)
 See anxiety A/P.  Overall her depressive symptoms are stable on Lexapro  5 mg daily and Remeron  15 mg nightly.

## 2024-06-14 NOTE — Patient Instructions (Addendum)
 It was very nice to see you today!  VISIT SUMMARY: Today, we addressed your ongoing anxiety and new issue of scalp itching. We discussed your current medications and introduced new treatments to help manage your symptoms.  YOUR PLAN: ANXIETY DISORDER: You have been experiencing chronic anxiety, which is currently managed with Lexapro . -Continue taking Lexapro  5 mg daily. -Start taking Gabapentin at a low dose to help with anxiety and itching. Be aware that it may cause drowsiness. -We will monitor your response to Gabapentin and adjust the dose if necessary.  PRURITUS OF SCALP: You have a persistent itch on your scalp that has led to scabbing. -Start using Triamcinolone  cream on your scalp to help with the itching. -Gabapentin may also help with the itching.  GENERAL HEALTH MAINTENANCE: Routine health maintenance was discussed. -Blood work has been ordered to monitor your overall health. -Your vaccinations are up to date and will be maintained.  Return if symptoms worsen or fail to improve.   Take care, Dr Kennyth  PLEASE NOTE:  If you had any lab tests, please let us  know if you have not heard back within a few days. You may see your results on mychart before we have a chance to review them but we will give you a call once they are reviewed by us .   If we ordered any referrals today, please let us  know if you have not heard from their office within the next week.   If you had any urgent prescriptions sent in today, please check with the pharmacy within an hour of our visit to make sure the prescription was transmitted appropriately.   Please try these tips to maintain a healthy lifestyle:  Eat at least 3 REAL meals and 1-2 snacks per day.  Aim for no more than 5 hours between eating.  If you eat breakfast, please do so within one hour of getting up.   Each meal should contain half fruits/vegetables, one quarter protein, and one quarter carbs (no bigger than a computer  mouse)  Cut down on sweet beverages. This includes juice, soda, and sweet tea.   Drink at least 1 glass of water  with each meal and aim for at least 8 glasses per day  Exercise at least 150 minutes every week.

## 2024-06-14 NOTE — Assessment & Plan Note (Signed)
 On Crestor  5 mg daily.  Check labs.

## 2024-06-14 NOTE — Progress Notes (Signed)
 Tammy Hughes is a 89 y.o. female who presents today for an office visit.  Assessment/Plan:  New/Acute Problems: Pruritus Patient with small amount of dermatitis on scalp.  Likely due to xerosis cutis.  Recommended daily emollients.  Okay to use topical triamcinolone  to the area.  We are checking labs today.  Chronic Problems Addressed Today: Anxiety state Had a lengthy discussion with the patient and her husband today regarding anxiety which seems to have worsened recently.  She is currently on Remeron  15 mg daily and Lexapro  5 mg daily though they are interested in adjusting medications as they do not feel like her symptoms are currently adequately controlled.  She is also having a few repetitive behaviors including picking a scab on her scalp and having her hand.  We did discuss potential treatment options including increasing dose of Lexapro  versus add on of augmenting medication.  They would like to try to augment current regimen.  We discussed potential treatment options.  Will add on gabapentin 100 mg nightly though they can increase to 2-3 times daily as needed over the next few weeks.  They will follow-up with us  in a few weeks via MyChart and we can adjust as needed.  Depending on response to gabapentin may consider trial of atypical antipsychotic including Abilify or Seroquel.  Recurrent depression See anxiety A/P.  Overall her depressive symptoms are stable on Lexapro  5 mg daily and Remeron  15 mg nightly.  Hypertension associated with diabetes (HCC) At goal today.  We will check labs  Dyslipidemia associated with type 2 diabetes mellitus (HCC) On Crestor  5 mg daily.  Check labs.     Subjective:  HPI:  See assessment / plan for status of chronic conditions.   Discussed the use of AI scribe software for clinical note transcription with the patient, who gave verbal consent to proceed.  History of Present Illness Tammy Hughes is an 89 year old female who presents with  anxiety and scalp itching.  She has been experiencing anxiety for some time and is currently taking Lexapro  5 mg daily, with no recent changes in her medication regimen. Her mood remains stable, though she experiences nervousness and irritability at home.  She reports persistent pruritus of the scalp, leading to excoriation and the development of a scab. The pruritus is described as a 'deep itch' localized to the scalp, with no other areas affected. She has not tried any treatments for the pruritus, although a beautician provided an unspecified topical application.  Her blood glucose levels are stable, with fluctuations noted postprandially. She maintains physical activity by walking indoors due to cold weather.  No pruritus or excoriation on the arms or legs. No other new symptoms or concerns.     06/14/2024    1:35 PM 05/20/2024   12:04 PM 08/12/2023    1:18 PM 06/09/2023    2:17 PM 06/04/2023   10:55 AM  Depression screen PHQ 2/9  Decreased Interest 0 0 0 1 0  Down, Depressed, Hopeless 1 0 0 0 0  PHQ - 2 Score 1 0 0 1 0  Altered sleeping 2   2   Tired, decreased energy 2   2   Change in appetite 0   1   Feeling bad or failure about yourself  2   0   Trouble concentrating 2   1   Moving slowly or fidgety/restless 2   2   Suicidal thoughts 0   0   PHQ-9 Score 11  9    Difficult doing work/chores Somewhat difficult         Data saved with a previous flowsheet row definition        06/14/2024    1:36 PM 08/07/2022    9:05 AM  GAD 7 : Generalized Anxiety Score  Nervous, Anxious, on Edge 1 0  Control/stop worrying  1  Worry too much - different things 0 1  Trouble relaxing 0 0  Restless 1 0  Easily annoyed or irritable 1 0  Afraid - awful might happen 0 1  Total GAD 7 Score  3  Anxiety Difficulty Somewhat difficult Not difficult at all            Objective:  Physical Exam: BP 138/76   Pulse 89   Temp (!) 97.2 F (36.2 C) (Temporal)   Ht 5' (1.524 m)   Wt 134 lb  12.8 oz (61.1 kg)   SpO2 95%   BMI 26.33 kg/m   Gen: No acute distress, resting comfortably Skin: Xerosis cutis noted.  Approximately 1 cm erythematous excoriation on anterior scalp Neuro: Grossly normal, moves all extremities Psych: Normal affect and thought content      Caleb M. Kennyth, MD 06/14/2024 1:38 PM

## 2024-06-15 ENCOUNTER — Encounter: Payer: Self-pay | Admitting: Family Medicine

## 2024-06-15 ENCOUNTER — Ambulatory Visit: Payer: Self-pay | Admitting: Family Medicine

## 2024-06-15 NOTE — Progress Notes (Signed)
 Her vitamin D  is a little bit low.  This may be causing some of her symptoms.  Recommend 50,000 IUs weekly.  Please send a new prescription.  We can recheck again in 3 to 6 months.  Her calcium  is also elevated.  Please have her come back to recheck PTH and ionized calcium .  All of her other labs are stable and we can recheck in a year.

## 2024-06-16 ENCOUNTER — Other Ambulatory Visit: Payer: Self-pay | Admitting: *Deleted

## 2024-06-16 ENCOUNTER — Ambulatory Visit

## 2024-06-16 VITALS — Ht 60.0 in | Wt 134.0 lb

## 2024-06-16 DIAGNOSIS — Z0000 Encounter for general adult medical examination without abnormal findings: Secondary | ICD-10-CM | POA: Diagnosis not present

## 2024-06-16 MED ORDER — VITAMIN D (ERGOCALCIFEROL) 1.25 MG (50000 UNIT) PO CAPS: 50000.0000 [IU] | ORAL_CAPSULE | ORAL | 0 refills | Status: AC

## 2024-06-16 NOTE — Patient Instructions (Signed)
 Ms. Stoy,  Thank you for taking the time for your Medicare Wellness Visit. I appreciate your continued commitment to your health goals. Please review the care plan we discussed, and feel free to reach out if I can assist you further.  Please note that Annual Wellness Visits do not include a physical exam. Some assessments may be limited, especially if the visit was conducted virtually. If needed, we may recommend an in-person follow-up with your provider.  Ongoing Care Seeing your primary care provider every 3 to 6 months helps us  monitor your health and provide consistent, personalized care.   Referrals If a referral was made during today's visit and you haven't received any updates within two weeks, please contact the referred provider directly to check on the status.  Recommended Screenings:  Health Maintenance  Topic Date Due   Complete foot exam   04/20/2024   COVID-19 Vaccine (6 - 2025-26 season) 06/30/2024*   Eye exam for diabetics  10/05/2024   Hemoglobin A1C  10/15/2024   DTaP/Tdap/Td vaccine (4 - Td or Tdap) 11/22/2027   Pneumococcal Vaccine for age over 22  Completed   Flu Shot  Completed   Zoster (Shingles) Vaccine  Completed   Meningitis B Vaccine  Aged Out   Breast Cancer Screening  Discontinued   Osteoporosis screening with Bone Density Scan  Discontinued  *Topic was postponed. The date shown is not the original due date.       06/16/2024    2:24 PM  Advanced Directives  Does Patient Have a Medical Advance Directive? Yes  Type of Advance Directive Healthcare Power of Attorney  Copy of Healthcare Power of Attorney in Chart? Yes - validated most recent copy scanned in chart (See row information)    Vision: Annual vision screenings are recommended for early detection of glaucoma, cataracts, and diabetic retinopathy. These exams can also reveal signs of chronic conditions such as diabetes and high blood pressure.  Dental: Annual dental screenings help detect early  signs of oral cancer, gum disease, and other conditions linked to overall health, including heart disease and diabetes.  Please see the attached documents for additional preventive care recommendations.

## 2024-06-16 NOTE — Progress Notes (Signed)
 Chief Complaint  Patient presents with   Medicare Wellness     Subjective:   Tammy Hughes is a 89 y.o. female who presents for a Medicare Annual Wellness Visit.  Visit info / Clinical Intake: Medicare Wellness Visit Type:: Subsequent Annual Wellness Visit Persons participating in visit and providing information:: patient & caregiver Medicare Wellness Visit Mode:: Telephone If telephone:: video declined Since this visit was completed virtually, some vitals may be partially provided or unavailable. Missing vitals are due to the limitations of the virtual format.: Unable to obtain vitals - no equipment If Telephone or Video please confirm:: I connected with patient using audio/video enable telemedicine. I verified patient identity with two identifiers, discussed telehealth limitations, and patient agreed to proceed. Patient Location:: home Provider Location:: home office Interpreter Needed?: No Pre-visit prep was completed: yes AWV questionnaire completed by patient prior to visit?: no Living arrangements:: lives with spouse/significant other Patient's Overall Health Status Rating: good Typical amount of pain: none Does pain affect daily life?: no Are you currently prescribed opioids?: no  Dietary Habits and Nutritional Risks How many meals a day?: 3 Eats fruit and vegetables daily?: yes Most meals are obtained by: preparing own meals; having others provide food; eating out In the last 2 weeks, have you had any of the following?: none Diabetic:: (!) yes Any non-healing wounds?: (!) yes (cream given for head) How often do you check your BS?: 1 Would you like to be referred to a Nutritionist or for Diabetic Management? : no  Functional Status Activities of Daily Living (to include ambulation/medication): Independent Ambulation: Independent with device- listed below Home Assistive Devices/Equipment: Eyeglasses Medication Administration: Needs assistance (comment) (melinda) Is  this a change from baseline?: Pre-admission baseline Home Management (perform basic housework or laundry): Needs assistance (comment) Manage your own finances?: (!) no Primary transportation is: driving Concerns about vision?: no *vision screening is required for WT[facility]* Concerns about hearing?: no  Fall Screening Falls in the past year?: 0 Number of falls in past year: 0 Was there an injury with Fall?: 0 Fall Risk Category Calculator: 0 Patient Fall Risk Level: Low Fall Risk  Fall Risk Patient at Risk for Falls Due to: No Fall Risks Fall risk Follow up: Falls prevention discussed  Home and Transportation Safety: All rugs have non-skid backing?: N/A, no rugs All stairs or steps have railings?: N/A, no stairs Grab bars in the bathtub or shower?: yes Have non-skid surface in bathtub or shower?: yes Good home lighting?: yes Regular seat belt use?: yes Hospital stays in the last year:: no  Cognitive Assessment Difficulty concentrating, remembering, or making decisions? : yes Will 6CIT or Mini Cog be Completed: no 6CIT or Mini Cog Declined: patient has a diagnosis of dementia or cognitive impairment  Advance Directives (For Healthcare) Does Patient Have a Medical Advance Directive?: Yes Does patient want to make changes to medical advance directive?: No - Patient declined Type of Advance Directive: Healthcare Power of Attorney Copy of Healthcare Power of Attorney in Chart?: Yes - validated most recent copy scanned in chart (See row information) Copy of Living Will in Chart?: No - copy requested  Reviewed/Updated  Reviewed/Updated: Reviewed All (Medical, Surgical, Family, Medications, Allergies, Care Teams, Patient Goals)    Allergies (verified) Pirfenidone and Aspirin    Current Medications (verified) Outpatient Encounter Medications as of 06/16/2024  Medication Sig   Acetaminophen  (TYLENOL  PO) Take 500 mg by mouth every 6 (six) hours as needed (Pain).   clopidogrel  (PLAVIX )  75 MG tablet  Take 1 tablet (75 mg total) by mouth daily.   Continuous Glucose Sensor (FREESTYLE LIBRE 2 SENSOR) MISC CHANGE EVERY 14 DAYS   donepezil  (ARICEPT ) 10 MG tablet TAKE 1 TABLET BY MOUTH ONCE DAILY   escitalopram  (LEXAPRO ) 5 MG tablet Take 1 tablet (5 mg total) by mouth daily.   gabapentin (NEURONTIN) 100 MG capsule Take 1 capsule (100 mg total) by mouth 3 (three) times daily.   Glucagon  (GVOKE HYPOPEN  1-PACK) 1 MG/0.2ML SOAJ Inject 1 mg into the skin as needed (low blood sugar with impaired consciousness).   insulin  aspart protamine - aspart (NOVOLOG  MIX 70/30 FLEXPEN) (70-30) 100 UNIT/ML FlexPen Inject 26 Units into the skin daily with breakfast AND 7 Units daily with supper.   meclizine  (ANTIVERT ) 25 MG tablet TAKE 1 TABLET BY MOUTH THREE TIMES DAILY IF NEEDED FOR DIZZINESS   memantine  (NAMENDA ) 10 MG tablet Take 1 tablet twice a day   mirtazapine  (REMERON ) 15 MG tablet Take 1 tablet (15 mg total) by mouth at bedtime.   pantoprazole  (PROTONIX ) 40 MG tablet Take 1 tablet (40 mg total) by mouth daily.   rosuvastatin  (CRESTOR ) 5 MG tablet TAKE 1 TABLET BY MOUTH ONCE DAILY   triamcinolone  ointment (KENALOG ) 0.5 % Apply 1 Application topically 2 (two) times daily.   Vitamin D , Ergocalciferol , (DRISDOL ) 1.25 MG (50000 UNIT) CAPS capsule Take 1 capsule (50,000 Units total) by mouth every 7 (seven) days.   No facility-administered encounter medications on file as of 06/16/2024.    History: Past Medical History:  Diagnosis Date   Allergy     SEASONAL   ANXIETY 03/08/2008   Cataract    REMOVED BILATERAL   Depression    DIVERTICULOSIS, COLON 03/08/2008   DVT (deep venous thrombosis) (HCC)    GERD 02/23/2007   HYPERCHOLESTEROLEMIA 02/01/2008   HYPERTENSION 03/08/2008   Insulin  dependent diabetes mellitus    OSTEOARTHRITIS 02/23/2007   OSTEOPOROSIS 02/23/2007   PERIPHERAL NEUROPATHY 02/23/2007   STD (sexually transmitted disease)    Stomach cancer (HCC) 07/20/14   invasive adenocarcinoma  w/signet rings features   Urinary incontinence    Urolithiasis    Past Surgical History:  Procedure Laterality Date   ABDOMINAL SURGERY     CARDIAC CATHETERIZATION N/A 02/02/2016   Procedure: Left Heart Cath and Coronary Angiography;  Surgeon: Peter M Jordan, MD;  Location: [facility] INVASIVE CV LAB;  Service: Cardiovascular;  Laterality: N/A;   CHOLECYSTECTOMY  1995   ESOPHAGOGASTRODUODENOSCOPY  06/08/2002   GASTRECTOMY N/A 07/20/2014   Procedure: PROXIMAL GASTRECTOMY;  Surgeon: Jina Nephew, MD;  Location: [facility] OR;  Service: General;  Laterality: N/A;   GASTROJEJUNOSTOMY N/A 07/20/2014   Procedure: GASTROJEJUNOSTOMY;  Surgeon: Jina Nephew, MD;  Location: [facility] OR;  Service: General;  Laterality: N/A;   LAPAROSCOPY N/A 07/20/2014   Procedure: LAPAROSCOPY DIAGNOSTIC;  Surgeon: Jina Nephew, MD;  Location: [facility] OR;  Service: General;  Laterality: N/A;   Family History  Problem Relation Age of Onset   Cancer Father        Prostate Cancer   Cancer Sister        Breast Cancer   Diabetes Sister    Diabetes Sister    Other Mother        pneumonia    Cancer Sister        breast cancer    Social History   Occupational History   Occupation: Retired    Associate Professor: RETIRED  Tobacco Use   Smoking status: Never   Smokeless tobacco: Never  Vaping Use  Vaping status: Never Used  Substance and Sexual Activity   Alcohol use: No    Alcohol/week: 0.0 standard drinks of alcohol   Drug use: No   Sexual activity: Not Currently    Birth control/protection: Post-menopausal   Tobacco Counseling Counseling given: Not Answered  SDOH Screenings   Food Insecurity: No Food Insecurity (06/16/2024)  Housing: Unknown (06/16/2024)  Transportation Needs: No Transportation Needs (06/16/2024)  Utilities: Not At Risk (06/16/2024)  Depression (PHQ2-9): Low Risk  (06/16/2024)  Recent Concern: Depression (PHQ2-9) - High Risk (06/14/2024)  Financial Resource Strain: Low Risk  (06/04/2023)  Physical Activity: Inactive  (06/16/2024)  Social Connections: Moderately Integrated (06/16/2024)  Stress: No Stress Concern Present (06/16/2024)  Tobacco Use: Low Risk  (06/16/2024)  Health Literacy: Adequate Health Literacy (06/16/2024)   See flowsheets for full screening details  Depression Screen PHQ 2 & 9 Depression Scale- Over the past 2 weeks, how often have you been bothered by any of the following problems? Little interest or pleasure in doing things: 0 Feeling down, depressed, or hopeless (PHQ Adolescent also includes...irritable): 0 PHQ-2 Total Score: 0 Trouble falling or staying asleep, or sleeping too much: 0 Feeling tired or having little energy: 0 Poor appetite or overeating (PHQ Adolescent also includes...weight loss): 0 Feeling bad about yourself - or that you are a failure or have let yourself or your family down: 0 Trouble concentrating on things, such as reading the newspaper or watching television (PHQ Adolescent also includes...like school work): 0 Moving or speaking so slowly that other people could have noticed. Or the opposite - being so fidgety or restless that you have been moving around a lot more than usual: 0 Thoughts that you would be better off dead, or of hurting yourself in some way: 0 PHQ-9 Total Score: 0 If you checked off any problems, how difficult have these problems made it for you to do your work, take care of things at home, or get along with other people?: Not difficult at all  Depression Treatment Depression Interventions/Treatment : [id number] Score <4 Follow-up Not Indicated     Goals Addressed               This Visit's Progress     eat more protien and exercise more (pt-stated)               Objective:    Today's Vitals   06/16/24 1422  Weight: 134 lb (60.8 kg)  Height: 5' (1.524 m)   Body mass index is 26.17 kg/m.  Hearing/Vision screen Hearing Screening - Comments:: Pt denies any hearing issues  Vision Screening - Comments:: Wears rx glasses - up to  date with routine eye exams with [facility]  Immunizations and Health Maintenance Health Maintenance  Topic Date Due   FOOT EXAM  04/20/2024   COVID-19 Vaccine (6 - 2025-26 season) 06/30/2024 (Originally 03/15/2024)   OPHTHALMOLOGY EXAM  10/05/2024   HEMOGLOBIN A1C  10/15/2024   DTaP/Tdap/Td (4 - Td or Tdap) 11/22/2027   Pneumococcal Vaccine: 50+ Years  Completed   Influenza Vaccine  Completed   Zoster Vaccines- Shingrix  Completed   Meningococcal B Vaccine  Aged Out   Mammogram  Discontinued   Bone Density Scan  Discontinued        Assessment/Plan:  This is a routine wellness examination for Tammy Hughes.  Patient Care Team: Kennyth Worth HERO, MD as PCP - General (Family Medicine) Loni Soyla LABOR, MD as PCP - Cardiology (Cardiology) Neysa Reggy BIRCH, MD as Attending  Physician (Pulmonary Disease) Debrah Lamar BIRCH, MD (Inactive) as Attending Physician (Gastroenterology) Brien Belvie BRAVO, MD as Consulting Physician (Pulmonary Disease) Cloretta Arley NOVAK, MD as Consulting Physician (Oncology) Kassie Mallick, MD (Inactive) as Consulting Physician (Endocrinology) Alm Priest  as Consulting Physician (Optometry) Georjean Darice HERO, MD as Consulting Physician (Neurology) Marne Kelly Nest, MD as Consulting Physician (Obstetrics and Gynecology) Wertman, Sara E, PA-C (Neurology)  I have personally reviewed and noted the following in the patient's chart:   Medical and social history Use of alcohol, tobacco or illicit drugs  Current medications and supplements including opioid prescriptions. Functional ability and status Nutritional status Physical activity Advanced directives List of other physicians Hospitalizations, surgeries, and ER visits in previous 12 months Vitals Screenings to include cognitive, depression, and falls Referrals and appointments  No orders of the defined types were placed in this encounter.  In addition, I have reviewed and discussed with patient  certain preventive protocols, quality metrics, and best practice recommendations. A written personalized care plan for preventive services as well as general preventive health recommendations were provided to patient.   Ellouise VEAR Haws, LPN   [id number]   Return in about 1 year (around 06/21/2025).  After Visit Summary: (MyChart) Due to this being a telephonic visit, the after visit summary with patients personalized plan was offered to patient via MyChart   Nurse Notes: none

## 2024-06-17 ENCOUNTER — Encounter: Payer: Self-pay | Admitting: Family Medicine

## 2024-06-18 MED ORDER — CLOPIDOGREL BISULFATE 75 MG PO TABS: 75.0000 mg | ORAL_TABLET | Freq: Every day | ORAL | 0 refills | Status: AC

## 2024-06-18 NOTE — Telephone Encounter (Signed)
Ok to send rx

## 2024-06-30 ENCOUNTER — Other Ambulatory Visit: Payer: Self-pay | Admitting: Family Medicine

## 2024-06-30 DIAGNOSIS — K219 Gastro-esophageal reflux disease without esophagitis: Secondary | ICD-10-CM

## 2024-07-16 NOTE — Progress Notes (Signed)
 "    Dementia likely due to    Tammy Hughes is a very pleasant 89 y.o. RH female with a history ofhypertension, hyperlipidemia, gastric cancer followed at the cancer center, pulmonary fibrosis, history of esophageal stricture, DM2 with latest A1c above 8, history of NSTEMI January 2016, depression, history of right upper extremity DVT in 2016 seen today in follow up for memory loss. Patient is currently on donepezil  10 mg daily and memantine  10 mg twice daily, tolerating well .  This patient is accompanied in the office by her husband  who supplements the history.  Previous records as well as any outside records available were reviewed prior to todays visit. Patient was last seen on 01/13/2024 . Memory is stable, with MMSE 21/30. Patient is able to participate on ADLs her ability and continues to drive without difficulties. Mood is anxious at times, her husband reports that occasionally she may experience a panic attack.  Patient is on Lexapro , as well as mirtazapine , recently gabapentin  was added by her PCP.  Of note, the patient was increasingly somnolent during our visit, would recommend to decrease the dose of mirtazapine  to 7.5 mg nightly possibly increasing Lexapro  to 5 mg daily which may be of benefit to her.  Continue donepezil  10 mg daily and memantine  10 mg twice daily, side effects discussed Continue mirtazapine  for sleep, mood and appetite, consider decreasing it to half (7.5 mg nightly) in view of increased somnolence while on the medicine as well as gabapentin .  Side effects discussed Recommend good control of cardiovascular risk factors.  She is on Plavix  daily Continue to control other mood meds as per PCP, she is on Lexapro  5 mg daily as per PCP, consider increasing to 10 mg daily if adjustments to the other above medications are made. Follow up in 6 6 months Increase socialization  Discussed the use of AI scribe software for clinical note transcription with the patient, who gave verbal  consent to proceed.  History of Present Illness Tammy Hughes is an 89 year old female who presents for a follow-up on her cognitive function and anxiety. She is accompanied by her husband, [name].  Her husband reports that her memory has remained fairly stable since her last visit on July 1st. She continues to engage in activities such as crossword puzzles and word finding occasionally, and she enjoys attending Church and participating in activities within her living community. She sometimes repeats herself and misplaces items but is able to find them. No worsening depression, hallucinations, paranoia, or seizures are reported. She experiences anxiety attacks occasionally, which are not worse than before. She is currently taking Lexapro  (citalopram) for anxiety, which she finds helpful. She also takes mirtazapine  for sleep, mood, and appetite, and gabapentin  to aid sleep. Her sleep is generally good without nightmares or vivid dreams.  She has a history of repetitive behaviors, including picking at a scab on her scalp and hand, and continues to move her hands frequently. She does not require reminders or assistance with personal hygiene tasks such as showering. Her daughter manages her medications, and her husband handles the finances. She maintains a good appetite and has no trouble swallowing.  She has arthritis in her hands and knees, for which she occasionally takes Tylenol .  Denies any recent falls or head injuries. No issues with weakness, tremors, dizziness, chest pain, or palpitations.  She has urinary incontinence and uses pads but reports no pain with urination or bowel issues such as diarrhea or constipation.  She lives with her husband in a residential community and does not drive.     History on Initial Assessment 12/28/2019: This is an 89 year old right-handed woman with a history of hypertension, hyperlipidemia, diabetes, stomach cancer, anxiety, depression, presenting for evaluation of  memory loss. Her daughter [name] is present to provide additional information. She states I just can't remember. Tammy Hughes started noticing changes around 4 years ago after she underwent chemotherapy and radiation treatment, worse in the past year. She repeats herself several times. She lives with her husband. She was diagnosed with cancer 4 years ago and was forgetting bill payments, her husband took over at that time. She denies getting lost driving. She forgets her medications, they had to count her pills and find that she has not taken them as instructed. She denies leaving the stove on but Tammy Hughes reports she has burned food, thinking she turned the burner off. She denies misplacing things. She is independent with dressing and bathing. Her 2 sisters and brother had Alzheimer's disease. No concussions or alcohol use. MMSE 24/30 in PCP office last 09/2019.   She has infrequent headaches, occasional dizziness/vertigo, low back pain, urinary incontinence. She denies any diplopia, dysarthria/dysphagia, focal numbness/tingling/weakness, tremors, or anosmia. No falls. Sleep is not good sometimes, she occasionally takes naps. No wandering behavior. Her daughter notes she is a little more irritable than before, she does not like a lot of people around. No hallucinations or paranoia.        07/19/2024    2:00 PM 01/13/2024    3:00 PM 08/22/2023   11:00 AM  MMSE - Mini Mental State Exam  Orientation to time 2 3 4   Orientation to Place 2 3 3   Registration 3 3 3   Attention/ Calculation 3 3 5   Recall 3 1 1   Language- name 2 objects 2 2 2   Language- repeat 1 1 1   Language- follow 3 step command 3 3 3   Language- read & follow direction 1 1 1   Write a sentence 1 1 1   Copy design 0 0 0  Total score 21 21 24        No data to display            Objective:    Neurological Exam:    VITALS:   Vitals:   07/19/24 1416  BP: 124/73  Pulse: (!) 58  SpO2: 96%  Weight: 136 lb 3.2 oz (61.8 kg)    GEN:  The  patient appears stated age and is in NAD. HEENT:  Normocephalic, atraumatic.   Neurological examination:  General: NAD, well-groomed, appears stated age. Orientation: The patient is alert. Oriented to person, not to place and date Cranial nerves: There is good facial symmetry. flat affect.  The speech is fluent and clear. No aphasia or dysarthria. Fund of knowledge is appropriate. Recent and remote memory are impaired. Attention and concentration are reduced. Able to name objects and repeat phrases.  Hearing is intact to conversational tone.   Sensation: Sensation is intact to light touch throughout Motor: Strength is at least antigravity x4. DTR's 2/4 in UE/LE     Movement examination:  Tone: There is normal tone in the UE/LE Abnormal movements:  no tremor.  No myoclonus.  No asterixis.   Coordination:  There is no decremation with RAM's. Normal finger to nose  Gait and Station: The patient has some difficulty arising out of a deep-seated chair without the use of the hands. The patient's stride length is short.  Gait  is cautious and mildly wide based.    Thank you for allowing us  the opportunity to participate in the care of this nice patient. Please do not hesitate to contact us  for any questions or concerns.   Total time spent on today's visit was 25 minutes dedicated to this patient today, preparing to see patient, examining the patient, ordering tests and/or medications and counseling the patient, documenting clinical information in the EHR or other health record, independently interpreting results and communicating results to the patient/family, discussing treatment and goals, answering patient's questions and coordinating care.  Cc:  Kennyth Worth HERO, MD  Camie Sevin 07/19/2024 3:01 PM          "

## 2024-07-18 ENCOUNTER — Other Ambulatory Visit: Payer: Self-pay | Admitting: "Endocrinology

## 2024-07-19 ENCOUNTER — Telehealth: Payer: Self-pay

## 2024-07-19 ENCOUNTER — Ambulatory Visit: Admitting: Physician Assistant

## 2024-07-19 ENCOUNTER — Other Ambulatory Visit (HOSPITAL_COMMUNITY): Payer: Self-pay

## 2024-07-19 VITALS — BP 124/73 | HR 58 | Wt 136.2 lb

## 2024-07-19 DIAGNOSIS — G301 Alzheimer's disease with late onset: Secondary | ICD-10-CM

## 2024-07-19 DIAGNOSIS — F02A0 Dementia in other diseases classified elsewhere, mild, without behavioral disturbance, psychotic disturbance, mood disturbance, and anxiety: Secondary | ICD-10-CM | POA: Diagnosis not present

## 2024-07-19 NOTE — Patient Instructions (Addendum)
 " It was a pleasure to see you today at our office.   Recommendations:  Follow up in 6  months Continue donepezil  10 mg daily. Side effects were discussed   Continue  Memantine  10 mg   twice daily.    Continue mirtazapine , consider decreasing to 7.5 mg at night  , and/or gabapentin  Consider joining day programs    If you have any severe symptoms of a stroke, or other severe issues such as confusion,severe chills or fever, etc call 911 or go to the ER as you may need to be evaluated further   Feel free to visit Facebook page  [employer] for tips of how to care for people with memory problems.    RECOMMENDATIONS FOR ALL PATIENTS WITH MEMORY PROBLEMS: 1. Continue to exercise (Recommend 30 minutes of walking everyday, or 3 hours every week) 2. Increase social interactions - continue going to [place] and enjoy social gatherings with friends and family 3. Eat healthy, avoid fried foods and eat more fruits and vegetables 4. Maintain adequate blood pressure, blood sugar, and blood cholesterol level. Reducing the risk of stroke and cardiovascular disease also helps promoting better memory. 5. Avoid stressful situations. Live a simple life and avoid aggravations. Organize your time and prepare for the next day in anticipation. 6. Sleep well, avoid any interruptions of sleep and avoid any distractions in the bedroom that may interfere with adequate sleep quality 7. Avoid sugar, avoid sweets as there is a strong link between excessive sugar intake, diabetes, and cognitive impairment We discussed the Mediterranean diet, which has been shown to help patients reduce the risk of progressive memory disorders and reduces cardiovascular risk. This includes eating fish, eat fruits and green leafy vegetables, nuts like almonds and hazelnuts, walnuts, and also use olive oil. Avoid fast foods and fried foods as much as possible. Avoid sweets and sugar as sugar use has been linked to worsening of memory  function.  There is always a concern of gradual progression of memory problems. If this is the case, then we may need to adjust level of care according to patient needs. Support, both to the patient and caregiver, should then be put into place.    The [employer] is here all day, every day for people facing Alzheimers disease through our free 24/7 Helpline: [phone]. The Helpline provides reliable information and support to all those who need assistance, such as individuals living with memory loss, Alzheimer's or other dementia, caregivers, health care professionals and the public.  Our highly trained and knowledgeable staff can help you with: Understanding memory loss, dementia and Alzheimer's  Medications and other treatment options  General information about aging and brain health  Skills to provide quality care and to find the best care from professionals  Legal, financial and living-arrangement decisions Our Helpline also features: Confidential care consultation provided by master's level clinicians who can help with decision-making support, crisis assistance and education on issues families face every day  Help in a caller's preferred language using our translation service that features more than 200 languages and dialects  Referrals to local community programs, services and ongoing support     FALL PRECAUTIONS: Be cautious when walking. Scan the area for obstacles that may increase the risk of trips and falls. When getting up in the mornings, sit up at the edge of the bed for a few minutes before getting out of bed. Consider elevating the bed at the head end to avoid drop of blood pressure when getting  up. Walk always in a well-lit room (use night lights in the walls). Avoid area rugs or power cords from appliances in the middle of the walkways. Use a walker or a cane if necessary and consider physical therapy for balance exercise. Get your eyesight checked  regularly.  FINANCIAL OVERSIGHT: Supervision, especially oversight when making financial decisions or transactions is also recommended.  HOME SAFETY: Consider the safety of the kitchen when operating appliances like stoves, microwave oven, and blender. Consider having supervision and share cooking responsibilities until no longer able to participate in those. Accidents with firearms and other hazards in the house should be identified and addressed as well.   ABILITY TO BE LEFT ALONE: If patient is unable to contact 911 operator, consider using LifeLine, or when the need is there, arrange for someone to stay with patients. Smoking is a fire hazard, consider supervision or cessation. Risk of wandering should be assessed by caregiver and if detected at any point, supervision and safe proof recommendations should be instituted.  MEDICATION SUPERVISION: Inability to self-administer medication needs to be constantly addressed. Implement a mechanism to ensure safe administration of the medications.   DRIVING: Regarding driving, in patients with progressive memory problems, driving will be impaired. We advise to have someone else do the driving if trouble finding directions or if minor accidents are reported. Independent driving assessment is available to determine safety of driving.   If you are interested in the driving assessment, you can contact the following:  [facility] [employer] [facility] in [place] [phone]  [facility] [phone]  [facility] [phone] or [phone]      Mediterranean Diet A Mediterranean diet refers to food and lifestyle choices that are based on the traditions of countries located on the [employer] [place]. This way of eating has been shown to help prevent certain conditions and improve outcomes for people who have chronic diseases, like kidney disease and heart disease. What are tips for following  this plan? Lifestyle  Cook and eat meals together with your family, when possible. Drink enough fluid to keep your urine clear or pale yellow. Be physically active every day. This includes: Aerobic exercise like running or swimming. Leisure activities like gardening, walking, or housework. Get 7-8 hours of sleep each night. If recommended by your health care provider, drink red wine in moderation. This means 1 glass a day for nonpregnant women and 2 glasses a day for men. A glass of wine equals 5 oz (150 mL). Reading food labels  Check the serving size of packaged foods. For foods such as rice and pasta, the serving size refers to the amount of cooked product, not dry. Check the total fat in packaged foods. Avoid foods that have saturated fat or trans fats. Check the ingredients list for added sugars, such as corn syrup. Shopping  At the grocery store, buy most of your food from the areas near the walls of the store. This includes: Fresh fruits and vegetables (produce). Grains, beans, nuts, and seeds. Some of these may be available in unpackaged forms or large amounts (in bulk). Fresh seafood. Poultry and eggs. Low-fat dairy products. Buy whole ingredients instead of prepackaged foods. Buy fresh fruits and vegetables in-season from local farmers markets. Buy frozen fruits and vegetables in resealable bags. If you do not have access to quality fresh seafood, buy precooked frozen shrimp or canned fish, such as tuna, salmon, or sardines. Buy small amounts of raw or cooked vegetables, salads, or olives  from the deli or salad bar at your store. Stock your pantry so you always have certain foods on hand, such as olive oil, canned tuna, canned tomatoes, rice, pasta, and beans. Cooking  Cook foods with extra-virgin olive oil instead of using butter or other vegetable oils. Have meat as a side dish, and have vegetables or grains as your main dish. This means having meat in small portions or adding  small amounts of meat to foods like pasta or stew. Use beans or vegetables instead of meat in common dishes like chili or lasagna. Experiment with different cooking methods. Try roasting or broiling vegetables instead of steaming or sauteing them. Add frozen vegetables to soups, stews, pasta, or rice. Add nuts or seeds for added healthy fat at each meal. You can add these to yogurt, salads, or vegetable dishes. Marinate fish or vegetables using olive oil, lemon juice, garlic, and fresh herbs. Meal planning  Plan to eat 1 vegetarian meal one day each week. Try to work up to 2 vegetarian meals, if possible. Eat seafood 2 or more times a week. Have healthy snacks readily available, such as: Vegetable sticks with hummus. Greek yogurt. Fruit and nut trail mix. Eat balanced meals throughout the week. This includes: Fruit: 2-3 servings a day Vegetables: 4-5 servings a day Low-fat dairy: 2 servings a day Fish, poultry, or lean meat: 1 serving a day Beans and legumes: 2 or more servings a week Nuts and seeds: 1-2 servings a day Whole grains: 6-8 servings a day Extra-virgin olive oil: 3-4 servings a day Limit red meat and sweets to only a few servings a month What are my food choices? Mediterranean diet Recommended Grains: Whole-grain pasta. Brown rice. Bulgar wheat. Polenta. Couscous. Whole-wheat bread. [place] Madeira. Vegetables: Artichokes. Beets. Broccoli. Cabbage. Carrots. Eggplant. Green beans. Chard. Kale. Spinach. Onions. Leeks. Peas. Squash. Tomatoes. Peppers. Radishes. Fruits: Apples. Apricots. Avocado. Berries. Bananas. Cherries. Dates. Figs. Grapes. Lemons. Melon. Oranges. Peaches. Plums. Pomegranate. Meats and other protein foods: Beans. Almonds. Sunflower seeds. Pine nuts. Peanuts. Cod. Salmon. Scallops. Shrimp. Tuna. Tilapia. Clams. Oysters. Eggs. Dairy: Low-fat milk. Cheese. Greek yogurt. Beverages: Water . Red wine. Herbal tea. Fats and oils: Extra virgin olive oil. Avocado  oil. Grape seed oil. Sweets and desserts: Greek yogurt with honey. Baked apples. Poached pears. Trail mix. Seasoning and other foods: Basil. Cilantro. Coriander. Cumin. Mint. Parsley. Sage. Rosemary. Tarragon. Garlic. Oregano. Thyme. Pepper. Balsalmic vinegar. Tahini. Hummus. Tomato sauce. Olives. Mushrooms. Limit these Grains: Prepackaged pasta or rice dishes. Prepackaged cereal with added sugar. Vegetables: Deep fried potatoes (french fries). Fruits: Fruit canned in syrup. Meats and other protein foods: Beef. Pork. Lamb. Poultry with skin. Hot dogs. [place]. Dairy: Ice cream. Sour cream. Whole milk. Beverages: Juice. Sugar-sweetened soft drinks. Beer. Liquor and spirits. Fats and oils: Butter. Canola oil. Vegetable oil. Beef fat (tallow). Lard. Sweets and desserts: Cookies. Cakes. Pies. Candy. Seasoning and other foods: Mayonnaise. Premade sauces and marinades. The items listed may not be a complete list. Talk with your dietitian about what dietary choices are right for you. Summary The Mediterranean diet includes both food and lifestyle choices. Eat a variety of fresh fruits and vegetables, beans, nuts, seeds, and whole grains. Limit the amount of red meat and sweets that you eat. Talk with your health care provider about whether it is safe for you to drink red wine in moderation. This means 1 glass a day for nonpregnant women and 2 glasses a day for men. A glass of wine equals 5 oz (150  mL). This information is not intended to replace advice given to you by your health care provider. Make sure you discuss any questions you have with your health care provider. Document Released: 02/22/2016 Document Revised: 03/26/2016 Document Reviewed: 02/22/2016 Elsevier Interactive Patient Education  2017 [employer].     "

## 2024-07-19 NOTE — Telephone Encounter (Signed)
 Pharmacy Patient Advocate Encounter   Received notification from RX Request Messages that prior authorization for Freestyle libre 2 plus sensor is required/requested.   Insurance verification completed.   The patient is insured through [employer].   Per test claim: Medication is not eligible for pharmacy benefits and must be billed through medical insurance. As our team only handles pharmacy related prior auths, medical PA's must be submitted by the clinic. Thank you  **I reached out to the pharmacy and was able to provide them with her Part B coverage information, as it populated when I ran the test claim.  BIN: [id number] PCN: [id number] GRP: [id number] ID: [id number]  After providing this information, they were able to get coverage through Part B. They are filling her sensors now with a $0 copay.

## 2024-07-20 DIAGNOSIS — E1165 Type 2 diabetes mellitus with hyperglycemia: Secondary | ICD-10-CM

## 2024-07-20 MED ORDER — FREESTYLE LIBRE 3 PLUS SENSOR MISC: 1.0000 | 3 refills | Status: AC

## 2024-07-30 ENCOUNTER — Ambulatory Visit: Admitting: Family Medicine

## 2024-07-30 ENCOUNTER — Encounter: Payer: Self-pay | Admitting: Family Medicine

## 2024-07-30 VITALS — BP 136/68 | HR 50 | Temp 97.4°F | Ht 60.0 in | Wt 133.0 lb

## 2024-07-30 DIAGNOSIS — Z794 Long term (current) use of insulin: Secondary | ICD-10-CM | POA: Diagnosis not present

## 2024-07-30 DIAGNOSIS — L89301 Pressure ulcer of unspecified buttock, stage 1: Secondary | ICD-10-CM | POA: Diagnosis not present

## 2024-07-30 DIAGNOSIS — E1142 Type 2 diabetes mellitus with diabetic polyneuropathy: Secondary | ICD-10-CM

## 2024-07-30 DIAGNOSIS — F02A0 Dementia in other diseases classified elsewhere, mild, without behavioral disturbance, psychotic disturbance, mood disturbance, and anxiety: Secondary | ICD-10-CM

## 2024-07-30 DIAGNOSIS — F339 Major depressive disorder, recurrent, unspecified: Secondary | ICD-10-CM | POA: Diagnosis not present

## 2024-07-30 DIAGNOSIS — F411 Generalized anxiety disorder: Secondary | ICD-10-CM

## 2024-07-30 DIAGNOSIS — F0280 Dementia in other diseases classified elsewhere without behavioral disturbance: Secondary | ICD-10-CM

## 2024-07-30 DIAGNOSIS — G301 Alzheimer's disease with late onset: Secondary | ICD-10-CM | POA: Diagnosis not present

## 2024-07-30 NOTE — Progress Notes (Signed)
 "  Tammy Hughes is a 89 y.o. female who presents today for an office visit.  Assessment/Plan:  New/Acute Problems: Pressure injury Small area of erythema on buttocks consistent with early stage pressure injury.  Recommended frequent repositioning though we are also referring to home health as below.  Chronic Problems Addressed Today: Alzheimer dementia [facility]) Had lengthy discussion with patient and her daughter today regarding progression of patient's Alzheimer's disease over the last several weeks.  She did see neurology recently and they indicated there was not much else that they could offer.  I do not think she would qualify for hospice program at this point however would likely benefit from home health for ongoing evaluation and observation.  Will place referral to home health today.  Will discontinue the gabapentin  as this was not effective.  Check urine culture to rule out UTI as well.  She does not currently have any agitation or aggressive symptoms-do not think we should treat with atypical antipsychotic at this point.  She will continue the donepezil  as prescribed by neurology.  We did discuss some limited evidence showing benefit of GLP agonist and creatine supplement however they will hold off on both of these at this point.    They will follow-up with me in a month.  Type 2 diabetes mellitus with peripheral neuropathy (HCC) On insulin  per endocrinology.  Recommended discussion about starting GLP agonist at the next visit.  Recurrent depression On Lexapro  5 mg daily and Remeron  15 mg daily.  We are discontinuing the gabapentin  as above.  If continues to have excessive somnolence would consider decreasing or stopping the Remeron  at her next visit here in a month as well.  Anxiety state She did not have any benefit with the gabapentin .  Will discontinue this.  Continue Lexapro  5 mg daily.  As above do not think we should add on any atypical antipsychotic at this point due to lack of  aggressive symptoms however they will check back in with us  in a month and we can adjust the plan as needed.  We discussed reasons to return to care sooner.     Subjective:  HPI:  See assessment / plan for status of chronic conditions.   Discussed the use of AI scribe software for clinical note transcription with the patient, who gave verbal consent to proceed.  History of Present Illness Tammy Hughes is an 89 year old female with Alzheimer's and dementia who presents with increased somnolence and language loss.  Over the past two weeks, she has experienced increased somnolence, often wanting to sleep all day, which has made it difficult to maintain her blood sugar levels above 100. She has been less active, requiring assistance to walk to the bathroom, and has shown a significant decrease in verbal communication, speaking less than five words at times.  Her daughter noticed a sore on her body while giving her a bath, raising concerns about a potential pressure sore. She has also been experiencing episodes of anxiety and agitation, sometimes waking up in the middle of the night screaming. Despite these challenges, this past week has been better, with her being more active, going to the store, and attending the doctor's appointment.  She has been on gabapentin  for anxiety, but there is no noticeable improvement in symptoms, and she continues to pick at her face. There is no history of dangerous behaviors such as wandering or aggression. Her daughter manages her medications and is concerned about her overall well-being.  Her intake  of food and fluids is minimal; last week she required assistance with feeding, but this week she has been able to feed herself. She drinks about eight ounces of water  daily, which is concerning given her need for hydration.         Objective:  Physical Exam: BP 136/68   Pulse (!) 50   Temp (!) 97.4 F (36.3 C) (Temporal)   Ht 5' (1.524 m)   Wt 133 lb (60.3  kg)   SpO2 97%   BMI 25.97 kg/m   Wt Readings from Last 3 Encounters:  07/30/24 133 lb (60.3 kg)  07/19/24 136 lb 3.2 oz (61.8 kg)  06/16/24 134 lb (60.8 kg)    Gen: No acute distress, resting comfortably CV: Regular rate and rhythm with no murmurs appreciated Pulm: Normal work of breathing, clear to auscultation bilaterally with no crackles, wheezes, or rhonchi GU: Small approximately 1 cm erythematous area on the left inferior buttocks.  Skin intact. Neuro: Grossly normal, moves all extremities Psych: Normal affect and thought content      Oneal Biglow M. Kennyth, MD 07/30/2024 12:22 PM  "

## 2024-07-30 NOTE — Assessment & Plan Note (Signed)
 She did not have any benefit with the gabapentin .  Will discontinue this.  Continue Lexapro  5 mg daily.  As above do not think we should add on any atypical antipsychotic at this point due to lack of aggressive symptoms however they will check back in with us  in a month and we can adjust the plan as needed.  We discussed reasons to return to care sooner.

## 2024-07-30 NOTE — Patient Instructions (Signed)
 It was very nice to see you today!  VISIT SUMMARY: During your visit, we discussed the recent changes in your condition, including increased sleepiness, decreased language, and agitation. We also addressed concerns about a potential pressure sore and your overall well-being.  YOUR PLAN: ALZHEIMER'S DISEASE WITH BEHAVIORAL DISTURBANCE: Your condition has progressed with increased sleepiness, decreased language, and agitation. Gabapentin  was ineffective and has been stopped. -Gabapentin  has been stopped due to lack of efficacy and potential drowsiness. -Seroquel is not recommended due to side effects and absence of aggression. -We discussed potential benefits of newer diabetes medications and creatine for cognitive function. -You have been referred to home health for assistance with monitoring and care. -We will discuss potential use of newer diabetes medications and creatine with an endocrinologist.  ADULT FAILURE TO THRIVE: Recent increased sleepiness and decreased appetite have affected your blood sugar levels. -You have been referred to home health for monitoring and assistance with daily activities. -A urine sample was checked to rule out a urinary tract infection.  EARLY STAGE PRESSURE ULCER: A small ulcer on your buttocks is due to prolonged sitting and decreased mobility. -Manage with Aquaphor and repositioning. -Continue Aquaphor application. -Implement frequent repositioning and use of soft pillows to alleviate pressure. -You have been referred to home health for monitoring and assistance with repositioning.  Return in about 1 month (around 08/30/2024) for Follow Up.   Take care, Dr Kennyth  PLEASE NOTE:  If you had any lab tests, please let us  know if you have not heard back within a few days. You may see your results on mychart before we have a chance to review them but we will give you a call once they are reviewed by us .   If we ordered any referrals today, please let us   know if you have not heard from their office within the next week.   If you had any urgent prescriptions sent in today, please check with the pharmacy within an hour of our visit to make sure the prescription was transmitted appropriately.   Please try these tips to maintain a healthy lifestyle:  Eat at least 3 REAL meals and 1-2 snacks per day.  Aim for no more than 5 hours between eating.  If you eat breakfast, please do so within one hour of getting up.   Each meal should contain half fruits/vegetables, one quarter protein, and one quarter carbs (no bigger than a computer mouse)  Cut down on sweet beverages. This includes juice, soda, and sweet tea.   Drink at least 1 glass of water  with each meal and aim for at least 8 glasses per day  Exercise at least 150 minutes every week.

## 2024-07-30 NOTE — Assessment & Plan Note (Signed)
 On insulin  per endocrinology.  Recommended discussion about starting GLP agonist at the next visit.

## 2024-07-30 NOTE — Assessment & Plan Note (Signed)
 Had lengthy discussion with patient and her daughter today regarding progression of patient's Alzheimer's disease over the last several weeks.  She did see neurology recently and they indicated there was not much else that they could offer.  I do not think she would qualify for hospice program at this point however would likely benefit from home health for ongoing evaluation and observation.  Will place referral to home health today.  Will discontinue the gabapentin  as this was not effective.  Check urine culture to rule out UTI as well.  She does not currently have any agitation or aggressive symptoms-do not think we should treat with atypical antipsychotic at this point.  She will continue the donepezil  as prescribed by neurology.  We did discuss some limited evidence showing benefit of GLP agonist and creatine supplement however they will hold off on both of these at this point.    They will follow-up with me in a month.

## 2024-07-30 NOTE — Assessment & Plan Note (Signed)
 On Lexapro  5 mg daily and Remeron  15 mg daily.  We are discontinuing the gabapentin  as above.  If continues to have excessive somnolence would consider decreasing or stopping the Remeron  at her next visit here in a month as well.

## 2024-07-31 LAB — URINE CULTURE
MICRO NUMBER:: 17479238
Result:: NO GROWTH
SPECIMEN QUALITY:: ADEQUATE

## 2024-08-03 ENCOUNTER — Ambulatory Visit: Payer: Self-pay | Admitting: Family Medicine

## 2024-08-03 NOTE — Progress Notes (Signed)
Urine culture is negative for UTI.

## 2024-08-04 ENCOUNTER — Encounter: Payer: Self-pay | Admitting: Family Medicine

## 2024-08-04 ENCOUNTER — Other Ambulatory Visit: Payer: Self-pay | Admitting: *Deleted

## 2024-08-04 MED ORDER — TRIAMCINOLONE ACETONIDE 0.5 % EX OINT: 1.0000 | TOPICAL_OINTMENT | Freq: Two times a day (BID) | CUTANEOUS | 0 refills | Status: AC

## 2024-08-04 MED ORDER — TRIAMCINOLONE ACETONIDE 0.5 % EX OINT: 1.0000 | TOPICAL_OINTMENT | Freq: Two times a day (BID) | CUTANEOUS | 0 refills | Status: DC

## 2024-08-04 NOTE — Telephone Encounter (Signed)
Refill send to pharmacy

## 2024-08-06 NOTE — Progress Notes (Signed)
 Results read by patient Via MyChart - Last read by Ronnald JONELLE Ned at 1:17PM on 08/04/2024.

## 2024-08-10 ENCOUNTER — Telehealth: Payer: Self-pay | Admitting: Family Medicine

## 2024-08-10 DIAGNOSIS — F02A3 Dementia in other diseases classified elsewhere, mild, with mood disturbance: Secondary | ICD-10-CM | POA: Diagnosis not present

## 2024-08-10 DIAGNOSIS — F339 Major depressive disorder, recurrent, unspecified: Secondary | ICD-10-CM | POA: Diagnosis not present

## 2024-08-10 DIAGNOSIS — F02A18 Dementia in other diseases classified elsewhere, mild, with other behavioral disturbance: Secondary | ICD-10-CM | POA: Diagnosis not present

## 2024-08-10 DIAGNOSIS — G4700 Insomnia, unspecified: Secondary | ICD-10-CM | POA: Diagnosis not present

## 2024-08-10 DIAGNOSIS — F02A11 Dementia in other diseases classified elsewhere, mild, with agitation: Secondary | ICD-10-CM | POA: Diagnosis not present

## 2024-08-10 DIAGNOSIS — L539 Erythematous condition, unspecified: Secondary | ICD-10-CM | POA: Diagnosis not present

## 2024-08-10 DIAGNOSIS — E1142 Type 2 diabetes mellitus with diabetic polyneuropathy: Secondary | ICD-10-CM | POA: Diagnosis not present

## 2024-08-10 DIAGNOSIS — F411 Generalized anxiety disorder: Secondary | ICD-10-CM | POA: Diagnosis not present

## 2024-08-10 DIAGNOSIS — E1159 Type 2 diabetes mellitus with other circulatory complications: Secondary | ICD-10-CM | POA: Diagnosis not present

## 2024-08-10 DIAGNOSIS — I152 Hypertension secondary to endocrine disorders: Secondary | ICD-10-CM | POA: Diagnosis not present

## 2024-08-10 DIAGNOSIS — F02A4 Dementia in other diseases classified elsewhere, mild, with anxiety: Secondary | ICD-10-CM | POA: Diagnosis not present

## 2024-08-10 DIAGNOSIS — G301 Alzheimer's disease with late onset: Secondary | ICD-10-CM | POA: Diagnosis not present

## 2024-08-10 NOTE — Telephone Encounter (Signed)
 Placed in PCP office to be reviewed

## 2024-08-10 NOTE — Telephone Encounter (Signed)
 [facility] faxed Home Health Certificate (Order [place] [id number]), to be filled out by provider. [facility] requested to send it back via Fax within ASAP. Document is located in providers tray at front office.Please advise at [phone].

## 2024-08-11 NOTE — Telephone Encounter (Signed)
 Form fased to [phone]

## 2024-08-17 ENCOUNTER — Ambulatory Visit: Admitting: "Endocrinology

## 2024-08-31 ENCOUNTER — Ambulatory Visit: Admitting: Family Medicine

## 2024-09-08 ENCOUNTER — Ambulatory Visit: Admitting: "Endocrinology

## 2025-01-17 ENCOUNTER — Ambulatory Visit: Payer: Self-pay | Admitting: Physician Assistant

## 2025-05-19 ENCOUNTER — Inpatient Hospital Stay: Admitting: Oncology

## 2025-06-21 ENCOUNTER — Ambulatory Visit
